# Patient Record
Sex: Female | Born: 1960 | Race: White | Hispanic: No | Marital: Single | Smoking: Former smoker
Health system: Southern US, Community
[De-identification: ages and names within clinical notes are randomized; demographics above are authoritative.]

## PROBLEM LIST (undated history)

## (undated) DIAGNOSIS — F419 Anxiety disorder, unspecified: Secondary | ICD-10-CM

## (undated) DIAGNOSIS — E079 Disorder of thyroid, unspecified: Secondary | ICD-10-CM

## (undated) DIAGNOSIS — I10 Essential (primary) hypertension: Secondary | ICD-10-CM

## (undated) DIAGNOSIS — J449 Chronic obstructive pulmonary disease, unspecified: Secondary | ICD-10-CM

## (undated) DIAGNOSIS — I509 Heart failure, unspecified: Secondary | ICD-10-CM

## (undated) DIAGNOSIS — E119 Type 2 diabetes mellitus without complications: Secondary | ICD-10-CM

## (undated) DIAGNOSIS — J45909 Unspecified asthma, uncomplicated: Secondary | ICD-10-CM

## (undated) HISTORY — PX: ABDOMINAL HYSTERECTOMY: SHX81

---

## 1997-06-21 ENCOUNTER — Ambulatory Visit: Admission: RE | Admit: 1997-06-21 | Discharge: 1997-06-21 | Payer: Self-pay | Admitting: *Deleted

## 2007-07-08 ENCOUNTER — Ambulatory Visit: Payer: Self-pay | Admitting: Internal Medicine

## 2007-07-08 DIAGNOSIS — F17200 Nicotine dependence, unspecified, uncomplicated: Secondary | ICD-10-CM

## 2007-07-08 DIAGNOSIS — J449 Chronic obstructive pulmonary disease, unspecified: Secondary | ICD-10-CM | POA: Insufficient documentation

## 2007-07-08 DIAGNOSIS — I10 Essential (primary) hypertension: Secondary | ICD-10-CM | POA: Insufficient documentation

## 2007-07-08 DIAGNOSIS — J438 Other emphysema: Secondary | ICD-10-CM

## 2007-07-08 DIAGNOSIS — E785 Hyperlipidemia, unspecified: Secondary | ICD-10-CM

## 2007-07-08 DIAGNOSIS — Z720 Tobacco use: Secondary | ICD-10-CM | POA: Insufficient documentation

## 2007-07-08 DIAGNOSIS — G4730 Sleep apnea, unspecified: Secondary | ICD-10-CM

## 2007-07-08 DIAGNOSIS — J45909 Unspecified asthma, uncomplicated: Secondary | ICD-10-CM

## 2007-07-08 DIAGNOSIS — R635 Abnormal weight gain: Secondary | ICD-10-CM | POA: Insufficient documentation

## 2007-07-09 ENCOUNTER — Encounter (INDEPENDENT_AMBULATORY_CARE_PROVIDER_SITE_OTHER): Payer: Self-pay | Admitting: *Deleted

## 2007-11-13 ENCOUNTER — Ambulatory Visit: Payer: Self-pay | Admitting: Cardiology

## 2007-11-20 ENCOUNTER — Inpatient Hospital Stay (HOSPITAL_COMMUNITY): Admission: AD | Admit: 2007-11-20 | Discharge: 2007-11-21 | Payer: Self-pay | Admitting: Cardiovascular Disease

## 2007-11-20 ENCOUNTER — Ambulatory Visit: Payer: Self-pay | Admitting: Cardiology

## 2010-05-16 NOTE — Discharge Summary (Signed)
Terri Casey, Terri Casey             ACCOUNT NO.:  [id number]   MEDICAL RECORD NO.:  [id number]          PATIENT TYPE:  INP   LOCATION:  2005                         FACILITY:  [facility]   PHYSICIAN:  Jonelle Sidle, MD DATE OF BIRTH:  [birth year]   DATE OF ADMISSION:  11/20/2007  DATE OF DISCHARGE:  11/21/2007                               DISCHARGE SUMMARY   PRIMARY CARDIOLOGIST:  Learta Codding, MD,FACC.   PRIMARY CARE Joseluis Alessio:  Currently [facility]  although the patient plans to transition to Dr. Olena Leatherwood in [place].   PULMONOLOGIST:  Dr. Orson Aloe in [place].   ENDOCRINOLOGIST:  Dr. Patrecia Pace.   DISCHARGE DIAGNOSIS:  Chest pain.   SECONDARY DIAGNOSES:  1. Hashimoto's thyroiditis with elevated TSH and low free T4 this      admission.  2. Small pericardial effusion.  3. Ongoing tobacco abuse.  4. Chronic obstructive pulmonary disease.  5. Community acquired pneumonia.  6. Hypertension.  7. Hyperlipidemia.  8. Obstructive sleep apnea.  9. Anxiety and depression.  10.Obesity.   ALLERGIES:  ESTROGEN.   PROCEDURES:  Left heart cardiac catheterization revealing normal  coronary arteries with nonobstructing disease in the LAD and normal LV  function.  Normal right heart pressures.   HISTORY OF PRESENT ILLNESS:  A 50 year old female with the above problem  list.  She was admitted to [facility] on November 17, 2007,  secondary to worsening symptoms of dyspnea and cough.  She was found to  be hypoxic on admission.  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  She  was placed on steroids, antibiotics and inhaler therapy and was  evaluated by Dr. Orson Aloe of pulmonology at [facility].  A CT of  her chest was performed and showed/reported a large pericardial effusion  with coronary calcification.  Dr. Andee Lineman  of [facility] was  consulted and repeat echocardiogram again showed a small pericardial  effusion with normal LV function.  The patient further described a  history of exertional chest discomfort and for this reason the decision  was made to transfer to [facility] for further evaluation and  catheterization.   HOSPITAL COURSE:  Left heart cardiac catheterization was performed on  November 19 revealing a 30% stenosis in the proximal LAD with otherwise  normal coronary arteries and normal LV function.  Right heart pressures  were normal.  Following the procedure Terri Casey has had no recurrent  chest discomfort, has been ambulating without difficulty and oxygenating  well.  She will be maintained on a steroid taper as well as antibiotics  to complete her course.  Of note, she was on [place] Thyroid prior to  admission.  However, the team at [facility] switched her over to  Synthroid 200 mcg daily.  We have recommended that she follow with Dr.  Patrecia Pace as well as establish or reestablish primary care followup.  The  patient will be discharged home today in good condition.  DISCHARGE LABS:  Hemoglobin 13.0, hematocrit 38.4, WBC 11.1, platelets  195, sodium 136, potassium 4.5, chloride 99, CO2 30, BUN 23, creatinine  0.83, glucose 124.   DISPOSITION:  Terri Casey will be discharged home today in good condition.   FOLLOWUP PLANS AND APPOINTMENTS:  She was asked to follow up with  primary care (she plans to follow up with Dr. Olena Leatherwood) in the next 1-2  weeks.  We asked her to follow up with Dr. Patrecia Pace in the next 3-4  weeks for followup of her hypothyroidism.   DISCHARGE MEDICATIONS:  1. Aspirin 81 mg daily.  2. Lisinopril/HCTZ 20/25 mg daily.  3. Crestor 20 mg daily.  4. Ceftin 250 mg b.i.d. x4 additional days.  5. Zithromax 500 mg daily x2 more days.  6. Advair 250/50 one puff b.i.d.  7. Singulair 10 mg q.h.s.  8. Synthroid 200 mcg daily.  9. Spiriva 18 mcg inhaler  daily.  10.Prednisone 10 mg six tabs November 21 and November 22; four tabs      November 23, November 24 and November 25; two tabs November 26,      November 27 and November 28; one tab November 29, November 30,      December 1.  11.ProAir HFA 90 mcg spray one to two sprays every 4 hours p.r.n.  12.Lexapro 20 mg daily.   OUTSTANDING LAB STUDIES:  The patient will require followup BMET in 1-2  weeks as we did adjust her lisinopril/HCTZ dose.  She will also require  followup thyroid function testing.  Duration of discharge encounter 65  minutes including physician time.      Nicolasa Ducking, ANP      Jonelle Sidle, MD  Electronically Signed    CB/MEDQ  D:  11/21/2007  T:  11/21/2007  Job:  [id number]   cc:   Alan Mulder, M.D.  Lia Hopping  Dr Orson Aloe

## 2010-05-16 NOTE — Cardiovascular Report (Signed)
NAMEMARIABELEN, Terri Casey             ACCOUNT NO.:  [id number]   MEDICAL RECORD NO.:  [id number]          PATIENT TYPE:  INP   LOCATION:  2005                         FACILITY:  [facility]   PHYSICIAN:  Everardo Beals. Juanda Chance, MD, FACCDATE OF BIRTH:  1960-09-16   DATE OF PROCEDURE:  11/20/2007  DATE OF DISCHARGE:                            CARDIAC CATHETERIZATION   CLINICAL HISTORY:  [name] is 50 years old and was recently admitted  to [facility] with shortness of breath and cough.  She also  recently had Hashimoto thyroiditis and recently had become hypothyroid.  She was found to have a pericardial effusion on CT scan and also  coronary calcification.  She was seen in consultation by Dr. Andee Lineman who  arranged for her transfer here for evaluation with cardiac  catheterization to rule out coronary artery disease as a cause of her  shortness of breath.   PROCEDURE:  Right heart catheterization was performed percutaneously via  the femoral vein using a venous sheath and Swan-Ganz thermodilution  catheter.  Left heart catheterization was performed percutaneously via  the right femoral artery, arterial sheath, and 6-French preformed  coronary catheters.  A front wall arterial puncture was performed and  Omnipaque contrast was used.  The patient tolerated the procedure well  and left the laboratory in satisfactory condition.   RESULTS:  The left main coronary is free of significant disease.   The left anterior descending artery gave rise to a diagonal branch,  septal perforator, and second diagonal branch.  There was 30% narrowing  in the proximal LAD and there were mild irregularities throughout the  LAD.   Circumflex artery gave rise to a large marginal branch and a small AV  branch.  These vessels were free of significant disease.   The right coronary was a moderate-sized vessel, gave rise to a conus  branch, 2 ventricle branches of the posterior descending branch, and a  posterolateral  branch.  These vessels were free of significant disease.   The left ventriculogram performed in the RAO projection showed vigorous  wall motion with no areas of hypokinesis.  The estimated ejection  fraction was 60%.   HEMODYNAMIC DATA:  The right artery pressure was 1, mean.  The pulmonary  artery pressure was 24/9 with a mean of 15.  Pulmonary wedge pressure  was 3, mean.  The left ventricular pressure was 120/9.  The aortic  pressure was 120/75 with a mean of 80.   CONCLUSION:  1. Mild nonobstructive coronary artery disease with 30% narrowing in      the proximal left anterior descending and mild irregularities in      the left anterior descending and no significant plaque seen in the      circumflex and right coronary artery.  2. Normal left ventricular function and normal left ventricular      filling pressures.   RECOMMENDATIONS:  1. Normal LV filling pressures and pulmonary artery pressures.  2. Normal left ventricular function and left ventricular filling      pressures.  3. Normal pulmonary artery pressures.   RECOMMENDATIONS:  Due to these findings,  I think the patient's recent  symptoms of dyspnea are not cardiac related.  They may be related to her  smoking and pulmonary disease.   ADDENDUM:  The cardiac output/cardiac index was 8.0/3.3 L/min/sq. m.      Bruce R. Juanda Chance, MD, [facility]  Electronically Signed     BRB/MEDQ  D:  11/20/2007  T:  11/21/2007  Job:  [id number]   cc:   Learta Codding, MD,FACC

## 2010-10-04 LAB — POCT I-STAT 3, VENOUS BLOOD GAS (G3P V): pH, Ven: 7.423 — ABNORMAL HIGH

## 2010-10-04 LAB — POCT I-STAT 3, ART BLOOD GAS (G3+)
pCO2 arterial: 40.9
pO2, Arterial: 59 — ABNORMAL LOW

## 2010-10-04 LAB — CBC
HCT: 38.4
Hemoglobin: 13
MCHC: 33.8
RBC: 3.95
RDW: 15.5

## 2010-10-04 LAB — BASIC METABOLIC PANEL
CO2: 30
GFR calc non Af Amer: >60
Glucose, Bld: 124 — ABNORMAL HIGH
Potassium: 4.5
Sodium: 136

## 2010-10-04 LAB — GLUCOSE, CAPILLARY

## 2011-11-01 ENCOUNTER — Other Ambulatory Visit (HOSPITAL_COMMUNITY): Payer: Self-pay | Admitting: "Endocrinology

## 2011-11-01 DIAGNOSIS — E042 Nontoxic multinodular goiter: Secondary | ICD-10-CM

## 2011-11-12 ENCOUNTER — Ambulatory Visit (HOSPITAL_COMMUNITY)
Admission: RE | Admit: 2011-11-12 | Discharge: 2011-11-12 | Disposition: A | Discharge status: Home / Self Care | Payer: Medicaid Other | Source: Ambulatory Visit | Attending: "Endocrinology | Admitting: "Endocrinology

## 2011-11-12 DIAGNOSIS — E042 Nontoxic multinodular goiter: Secondary | ICD-10-CM | POA: Insufficient documentation

## 2011-11-12 DIAGNOSIS — R599 Enlarged lymph nodes, unspecified: Secondary | ICD-10-CM | POA: Insufficient documentation

## 2012-09-26 ENCOUNTER — Emergency Department (HOSPITAL_COMMUNITY)
Admission: EM | Admit: 2012-09-26 | Discharge: 2012-09-27 | Disposition: A | Discharge status: Home / Self Care | Payer: Medicaid Other | Attending: Emergency Medicine | Admitting: Emergency Medicine

## 2012-09-26 ENCOUNTER — Emergency Department (HOSPITAL_COMMUNITY): Payer: Medicaid Other

## 2012-09-26 ENCOUNTER — Encounter (HOSPITAL_COMMUNITY): Payer: Self-pay

## 2012-09-26 DIAGNOSIS — E079 Disorder of thyroid, unspecified: Secondary | ICD-10-CM | POA: Insufficient documentation

## 2012-09-26 DIAGNOSIS — IMO0002 Reserved for concepts with insufficient information to code with codable children: Secondary | ICD-10-CM | POA: Insufficient documentation

## 2012-09-26 DIAGNOSIS — F17200 Nicotine dependence, unspecified, uncomplicated: Secondary | ICD-10-CM | POA: Insufficient documentation

## 2012-09-26 DIAGNOSIS — Z79899 Other long term (current) drug therapy: Secondary | ICD-10-CM | POA: Insufficient documentation

## 2012-09-26 DIAGNOSIS — J441 Chronic obstructive pulmonary disease with (acute) exacerbation: Secondary | ICD-10-CM | POA: Insufficient documentation

## 2012-09-26 DIAGNOSIS — F411 Generalized anxiety disorder: Secondary | ICD-10-CM | POA: Insufficient documentation

## 2012-09-26 HISTORY — DX: Unspecified asthma, uncomplicated: J45.909

## 2012-09-26 HISTORY — DX: Anxiety disorder, unspecified: F41.9

## 2012-09-26 HISTORY — DX: Disorder of thyroid, unspecified: E07.9

## 2012-09-26 HISTORY — DX: Chronic obstructive pulmonary disease, unspecified: J44.9

## 2012-09-26 LAB — CBC WITH DIFFERENTIAL/PLATELET
Basophils Absolute: 0 10*3/uL (ref 0.0–0.1)
Basophils Relative: 0 % (ref 0–1)
Eosinophils Absolute: 0.3 10*3/uL (ref 0.0–0.7)
Eosinophils Relative: 3 % (ref 0–5)
HCT: 47.1 % — ABNORMAL HIGH (ref 36.0–46.0)
Hemoglobin: 14.9 g/dL (ref 12.0–15.0)
MCH: 32.3 pg (ref 26.0–34.0)
MCHC: 31.6 g/dL (ref 30.0–36.0)
Monocytes Absolute: 0.7 10*3/uL (ref 0.1–1.0)
Neutro Abs: 6.2 10*3/uL (ref 1.7–7.7)
Platelets: 182 10*3/uL (ref 150–400)
RDW: 14.9 % (ref 11.5–15.5)

## 2012-09-26 LAB — BLOOD GAS, ARTERIAL
O2 Content: 2.5 L/min
Patient temperature: 37
pH, Arterial: 7.393 (ref 7.350–7.450)

## 2012-09-26 LAB — PRO B NATRIURETIC PEPTIDE: Pro B Natriuretic peptide (BNP): 81.7 pg/mL (ref 0–125)

## 2012-09-26 LAB — COMPREHENSIVE METABOLIC PANEL
AST: 12 U/L (ref 0–37)
Albumin: 3.5 g/dL (ref 3.5–5.2)
Calcium: 9.3 mg/dL (ref 8.4–10.5)
Chloride: 103 mEq/L (ref 96–112)
Creatinine, Ser: 0.99 mg/dL (ref 0.50–1.10)

## 2012-09-26 LAB — D-DIMER, QUANTITATIVE: D-Dimer, Quant: 0.73 ug/mL-FEU — ABNORMAL HIGH (ref 0.00–0.48)

## 2012-09-26 MED ORDER — IPRATROPIUM BROMIDE 0.02 % IN SOLN
0.5000 mg | Freq: Once | RESPIRATORY_TRACT | Status: AC
  Administered 2012-09-26: 0.5 mg via RESPIRATORY_TRACT
  Filled 2012-09-26: qty 2.5

## 2012-09-26 MED ORDER — IOHEXOL 350 MG/ML SOLN
100.0000 mL | Freq: Once | INTRAVENOUS | Status: AC | PRN
  Administered 2012-09-26: 100 mL via INTRAVENOUS

## 2012-09-26 MED ORDER — PREDNISONE 10 MG PO TABS
60.0000 mg | ORAL_TABLET | Freq: Once | ORAL | Status: AC
  Administered 2012-09-26: 60 mg via ORAL
  Filled 2012-09-26 (×2): qty 1

## 2012-09-26 MED ORDER — ALBUTEROL SULFATE (5 MG/ML) 0.5% IN NEBU
5.0000 mg | INHALATION_SOLUTION | Freq: Once | RESPIRATORY_TRACT | Status: AC
Start: 2012-09-26 — End: 2012-09-26
  Administered 2012-09-26: 5 mg via RESPIRATORY_TRACT
  Filled 2012-09-26: qty 1

## 2012-09-26 MED ORDER — ALBUTEROL SULFATE (5 MG/ML) 0.5% IN NEBU
INHALATION_SOLUTION | RESPIRATORY_TRACT | Status: AC
  Administered 2012-09-26: 5 mg
  Filled 2012-09-26: qty 1

## 2012-09-26 MED ORDER — IPRATROPIUM BROMIDE 0.02 % IN SOLN
RESPIRATORY_TRACT | Status: AC
  Administered 2012-09-26: 0.5 mg
  Filled 2012-09-26: qty 2.5

## 2012-09-26 NOTE — Progress Notes (Signed)
Pt getting 3 nebs , as ordered ask for by DR Rancor

## 2012-09-26 NOTE — ED Notes (Signed)
dx'd with bronchitis, on nebs, home O2, inhalers. No relief with same. Here by EMS

## 2012-09-26 NOTE — ED Provider Notes (Signed)
CSN: [id number]     Arrival date & time 09/26/12  2139 History  This chart was scribed for Terri Octave, MD by Bennett Scrape, ED Scribe. This patient was seen in room APA02/APA02 and the patient's care was started at 9:46 PM.   Chief Complaint  Patient presents with  . Shortness of Breath    The history is provided by the patient. No language interpreter was used.    HPI Comments: Terri Casey is a 52 y.o. female with a h/o COPD treated with 2.5 L [place] continuously at home who presents to the Emergency Department brought in by ambulance complaining of persistent SOB over the past 2 days that has been gradually worsening since the onset. The symptoms are worsened with laying flat.  She lists NP cough, chills and CP and RUQ with coughing as associated symptoms. She also reports a decreased appetite which is a new symptom today. She has otherwise been eating and drinking normally since the onset. Pt states that she was diagnosed with bronchitis 11 days ago and was given a nebulizer and prednisone. She denies being prescribed any antibiotics. She reports taking 4 breathing treatments today plus the one she recieved en route with improvement. Last hospitalization for COPD was last year. She denies having a h/o cardiac disease or DM. She denies emesis and known fevers at home as associated symptoms. She is a 0.5 ppd smoker.   PCP is Dr. Loney Hering   Past Medical History  Diagnosis Date  . Asthma   . COPD (chronic obstructive pulmonary disease)   . Anxiety   . Thyroid disease    Past Surgical History  Procedure Laterality Date  . Abdominal hysterectomy     No family history on file. History  Substance Use Topics  . Smoking status: Current Every Day Smoker -- 0.50 packs/day  . Smokeless tobacco: Not on file  . Alcohol Use: No   No OB history provided.  Review of Systems  A complete 10 system review of systems was obtained and all systems are negative except as noted in the HPI and  PMH.   Allergies  Estrogens; Mustard seed; and Strawberry  Home Medications   Current Outpatient Rx  Name  Route  Sig  Dispense  Refill  . albuterol (PROAIR HFA) 108 (90 BASE) MCG/ACT inhaler   Inhalation   Inhale 2 puffs into the lungs every 6 (six) hours as needed for wheezing.         Marland Kitchen ALPRAZolam (XANAX) 1 MG tablet   Oral   Take 1 mg by mouth 2 (two) times daily.         . benzonatate (TESSALON) 100 MG capsule   Oral   Take 100 mg by mouth 3 (three) times daily as needed for cough.         . fluticasone (FLONASE) 50 MCG/ACT nasal spray   Nasal   Place 2 sprays into the nose daily.         . Fluticasone-Salmeterol (ADVAIR) 250-50 MCG/DOSE AEPB   Inhalation   Inhale 1 puff into the lungs every 12 (twelve) hours.         Marland Kitchen levothyroxine (SYNTHROID, LEVOTHROID) 150 MCG tablet   Oral   Take 150 mcg by mouth daily before breakfast.         . loratadine (CLARITIN) 10 MG tablet   Oral   Take 10 mg by mouth daily.         Marland Kitchen oxyCODONE-acetaminophen (PERCOCET) 10-325 MG per  tablet   Oral   Take 1 tablet by mouth every 6 (six) hours as needed for pain.         Marland Kitchen tiotropium (SPIRIVA) 18 MCG inhalation capsule   Inhalation   Place 18 mcg into inhaler and inhale daily.         Marland Kitchen albuterol (PROVENTIL HFA;VENTOLIN HFA) 108 (90 BASE) MCG/ACT inhaler   Inhalation   Inhale 2 puffs into the lungs every 6 (six) hours as needed for wheezing.   1 Inhaler   2   . doxycycline (VIBRAMYCIN) 100 MG capsule   Oral   Take 1 capsule (100 mg total) by mouth 2 (two) times daily.   20 capsule   0   . predniSONE (DELTASONE) 50 MG tablet      1 tablet PO daily   5 tablet   0     Triage Vitals: BP 125/75  Pulse 70  Temp(Src) 97.9 F (36.6 C) (Oral)  Resp 24  Ht 5\' 9"  (1.753 m)  Wt 269 lb (122.018 kg)  BMI 39.71 kg/m2  SpO2 94%  Physical Exam  Nursing note and vitals reviewed. Constitutional: She is oriented to person, place, and time. She appears  well-developed and well-nourished. No distress.  HENT:  Head: Normocephalic and atraumatic.  Eyes: Conjunctivae and EOM are normal.  Neck: Normal range of motion. Neck supple. No tracheal deviation present.  Cardiovascular: Normal rate, regular rhythm and normal heart sounds.   No murmur heard. Pulmonary/Chest: Tachypnea noted. No respiratory distress. She has wheezes. She has no rales.  Speaking in full sentences, mild tachypnea, moderate air exchange with bronchitic expiratory wheezing   Abdominal: Soft. Bowel sounds are normal. There is no tenderness.  Musculoskeletal: Normal range of motion. She exhibits no edema (no calf swelling) and no tenderness (no calf tenderness).  Neurological: She is alert and oriented to person, place, and time. No cranial nerve deficit.  Skin: Skin is warm and dry.  Psychiatric: She has a normal mood and affect. Her behavior is normal.    ED Course  Procedures (including critical care time)  Medications  albuterol (PROVENTIL) (5 MG/ML) 0.5% nebulizer solution 5 mg (5 mg Nebulization Given 09/26/12 2154)  ipratropium (ATROVENT) nebulizer solution 0.5 mg (0.5 mg Nebulization Given 09/26/12 2153)  predniSONE (DELTASONE) tablet 60 mg (60 mg Oral Given 09/26/12 2156)  albuterol (PROVENTIL) (5 MG/ML) 0.5% nebulizer solution (5 mg  Given 09/26/12 2308)  ipratropium (ATROVENT) 0.02 % nebulizer solution (0.5 mg  Given 09/26/12 2307)  iohexol (OMNIPAQUE) 350 MG/ML injection 100 mL (100 mLs Intravenous Contrast Given 09/26/12 2352)  ipratropium (ATROVENT) 0.02 % nebulizer solution (0.5 mg  Given 09/27/12 0017)  albuterol (PROVENTIL) (5 MG/ML) 0.5% nebulizer solution (5 mg  Given 09/27/12 0016)    DIAGNOSTIC STUDIES: Oxygen Saturation is 98% on 2.5 L [name], normal by my interpretation.    COORDINATION OF CARE: 9:52 PM-Discussed treatment plan which includes breathing treatment, CXR, CBC panel, CMP and d-dimer with pt at bedside and pt agreed to plan.   Labs Review Labs  Reviewed  CBC WITH DIFFERENTIAL - Abnormal; Notable for the following:    WBC 10.9 (*)    HCT 47.1 (*)    MCV 101.9 (*)    All other components within normal limits  COMPREHENSIVE METABOLIC PANEL - Abnormal; Notable for the following:    Alkaline Phosphatase 127 (*)    GFR calc non Af Amer 64 (*)    GFR calc Af Amer 74 (*)  All other components within normal limits  D-DIMER, QUANTITATIVE - Abnormal; Notable for the following:    D-Dimer, Quant 0.73 (*)    All other components within normal limits  BLOOD GAS, ARTERIAL - Abnormal; Notable for the following:    pCO2 arterial 45.6 (*)    pO2, Arterial 66.6 (*)    Bicarbonate 27.2 (*)    Acid-Base Excess 2.7 (*)    All other components within normal limits  TROPONIN I  PRO B NATRIURETIC PEPTIDE   Imaging Review Dg Chest 2 View  09/26/2012   *RADIOLOGY REPORT*  Clinical Data: Shortness of breath.  CHEST - 2 VIEW  Comparison: Chest radiograph October 16, 2011.  Findings: Cardiomediastinal silhouette is unremarkable and unchanged.  Similar pulmonary hyperexpansion with flattening of the hemidiaphragms.  Mild prominence of interstitial markings most conspicuous in the lung bases similar.  No superimposed focal consolidations or pleural effusions.  Trachea projects midline and there is no pneumothorax.  Multiple EKG lines overlying the patient which may obscure underlying pathology.  Included soft tissue planes and osseous structures are not suspicious.  IMPRESSION: Emphysematous changes with interstitial prominence in the lung bases which appears chronic, no acute cardiopulmonary process.   Original Report Authenticated By: Awilda Metro   Ct Angio Chest Pe W/cm &/or Wo Cm  09/27/2012   CLINICAL DATA:  Shortness of breath. Weakness. History of COPD and bronchitis. Thyroid disease. Anxiety.  EXAM: CT ANGIOGRAPHY CHEST WITH CONTRAST  TECHNIQUE: Multidetector CT imaging of the chest was performed using the standard protocol during bolus  administration of intravenous contrast. Multiplanar CT image reconstructions including MIPs were obtained to evaluate the vascular anatomy.  CONTRAST:  OMNIPAQUE IOHEXOL 350 MG/ML SOLN  COMPARISON:  Plain films earlier today. CT 03/12/2009 from [facility] hospital.  FINDINGS: Lung windows demonstrate mild motion degradation. Mild centrilobular emphysema and bronchial wall thickening. Bibasilar subsegmental atelectasis.  No airspace disease.  Soft tissue windows: The quality of this exam for evaluation of pulmonary embolism is moderate to good. There is a large amount of contrast remaining in the SVC. No evidence of pulmonary embolism.  Normal appearance of the thoracic aorta, without dissection or aneurysm. There is atherosclerosis within. Normal heart size with a similar moderate pericardial effusion.  Pulmonary artery enlargement, with the outflow tract measuring 3.6 cm.  Borderline right infrahilar adenopathy on image 62/ series 5 is chronic and likely reactive.  Limited abdominal imaging demonstrates no significant findings.  Bone windows demonstrate no acute osseous finding.  Review of the MIP images confirms the above findings.  IMPRESSION: 1. No evidence of pulmonary embolism. Mild motion and bolus timing degradation. 2. Chronic moderate pericardial effusion. 3. Centrilobular emphysema. 4. Pulmonary artery enlargement suggests pulmonary arterial hypertension.   Electronically Signed   By: Jeronimo Greaves   On: 09/27/2012 00:19    MDM   1. COPD exacerbation    History of COPD and home oxygen presenting with one week history of worsening shortness of breath. Treated for bronchitis by her PCP last week with prednisone. Worsening dyspnea today, worse with lying down. No chest pain, fever. Cough is nonproductive.  Nebulizers, steroids, antibiotics. CXR without infiltrate.  Work of breathing improved after nebs x 2 in ED. BNP normal. No evidence of CHF or volume overload.  Patient ambulatory in the  ED, without home O2 on, maintaining O2 saturations >90%.  D-dimer elevated. CTPE pending at time of sign out to Dr. Read Drivers. Anticipate discharge home with steroids, antibiotics and bronchodilators.   Date: 09/26/2012  Rate: 89  Rhythm: normal sinus rhythm  QRS Axis: normal  Intervals: normal  ST/T Wave abnormalities: normal  Conduction Disutrbances:none  Narrative Interpretation:   Old EKG Reviewed: none available   I personally performed the services described in this documentation, which was scribed in my presence. The recorded information has been reviewed and is accurate.      Terri Octave, MD 09/27/12 0120

## 2012-09-27 MED ORDER — IPRATROPIUM BROMIDE 0.02 % IN SOLN
RESPIRATORY_TRACT | Status: AC
  Administered 2012-09-27: 0.5 mg
  Filled 2012-09-27: qty 2.5

## 2012-09-27 MED ORDER — PREDNISONE 50 MG PO TABS: ORAL_TABLET | ORAL | Status: DC

## 2012-09-27 MED ORDER — DOXYCYCLINE HYCLATE 100 MG PO CAPS: 100.0000 mg | ORAL_CAPSULE | Freq: Two times a day (BID) | ORAL | Status: DC

## 2012-09-27 MED ORDER — ALBUTEROL SULFATE HFA 108 (90 BASE) MCG/ACT IN AERS: 2.0000 | INHALATION_SPRAY | Freq: Four times a day (QID) | RESPIRATORY_TRACT | Status: DC | PRN

## 2012-09-27 MED ORDER — ALBUTEROL SULFATE (5 MG/ML) 0.5% IN NEBU
INHALATION_SOLUTION | RESPIRATORY_TRACT | Status: AC
  Administered 2012-09-27: 5 mg
  Filled 2012-09-27: qty 1

## 2012-09-27 NOTE — ED Notes (Signed)
Patient getting breathing treatment at this time.

## 2012-09-27 NOTE — ED Notes (Signed)
Ambulated patient on room air around nursing desk with pulse oximetry. Oxygen was between 88 and 91 %. Upon returning to the room patient was having a lot of wheezing and coughing. Patient states that her throat hurts from all the coughing.

## 2014-07-13 ENCOUNTER — Other Ambulatory Visit (HOSPITAL_COMMUNITY): Payer: Self-pay | Admitting: "Endocrinology

## 2014-07-13 DIAGNOSIS — E8849 Other mitochondrial metabolism disorders: Secondary | ICD-10-CM

## 2014-08-04 ENCOUNTER — Ambulatory Visit (HOSPITAL_COMMUNITY): Payer: Medicaid Other

## 2014-08-12 ENCOUNTER — Other Ambulatory Visit (HOSPITAL_COMMUNITY): Payer: Self-pay | Admitting: "Endocrinology

## 2014-08-12 DIAGNOSIS — E049 Nontoxic goiter, unspecified: Secondary | ICD-10-CM

## 2014-08-13 ENCOUNTER — Ambulatory Visit (HOSPITAL_COMMUNITY): Admission: RE | Admit: 2014-08-13 | Payer: Medicaid Other | Source: Ambulatory Visit

## 2014-10-25 ENCOUNTER — Other Ambulatory Visit: Payer: Self-pay | Admitting: "Endocrinology

## 2014-12-23 ENCOUNTER — Other Ambulatory Visit: Payer: Self-pay | Admitting: "Endocrinology

## 2015-01-24 ENCOUNTER — Other Ambulatory Visit: Payer: Self-pay | Admitting: "Endocrinology

## 2015-02-21 ENCOUNTER — Other Ambulatory Visit: Payer: Self-pay

## 2015-02-21 DIAGNOSIS — R739 Hyperglycemia, unspecified: Secondary | ICD-10-CM

## 2015-02-21 DIAGNOSIS — E039 Hypothyroidism, unspecified: Secondary | ICD-10-CM

## 2015-02-24 LAB — TSH: TSH: 18.27 mIU/L — ABNORMAL HIGH

## 2015-02-24 LAB — T4, FREE: Free T4: 0.9 ng/dL (ref 0.8–1.8)

## 2015-02-24 LAB — HEMOGLOBIN A1C
Hgb A1c MFr Bld: 5.3 % (ref <5.7)
MEAN PLASMA GLUCOSE: 105 mg/dL (ref <117)

## 2015-03-03 ENCOUNTER — Ambulatory Visit (INDEPENDENT_AMBULATORY_CARE_PROVIDER_SITE_OTHER): Payer: Medicaid Other | Admitting: "Endocrinology

## 2015-03-03 ENCOUNTER — Encounter: Payer: Self-pay | Admitting: "Endocrinology

## 2015-03-03 VITALS — BP 126/88 | HR 88 | Ht 69.0 in | Wt 264.0 lb

## 2015-03-03 DIAGNOSIS — E785 Hyperlipidemia, unspecified: Secondary | ICD-10-CM

## 2015-03-03 DIAGNOSIS — E559 Vitamin D deficiency, unspecified: Secondary | ICD-10-CM | POA: Diagnosis not present

## 2015-03-03 DIAGNOSIS — I10 Essential (primary) hypertension: Secondary | ICD-10-CM

## 2015-03-03 DIAGNOSIS — E039 Hypothyroidism, unspecified: Secondary | ICD-10-CM | POA: Diagnosis not present

## 2015-03-03 MED ORDER — LEVOTHYROXINE SODIUM 175 MCG PO TABS: 175.0000 ug | ORAL_TABLET | Freq: Every day | ORAL | Status: DC

## 2015-03-03 MED ORDER — VITAMIN D (ERGOCALCIFEROL) 1.25 MG (50000 UNIT) PO CAPS: 50000.0000 [IU] | ORAL_CAPSULE | ORAL | Status: DC

## 2015-03-03 NOTE — Progress Notes (Signed)
Subjective:    Patient ID: Terri Casey, female    DOB: Sep 07, 1960, PCP No primary care provider on file.   Past Medical History  Diagnosis Date  . Asthma   . COPD (chronic obstructive pulmonary disease) ([place])   . Anxiety   . Thyroid disease    Past Surgical History  Procedure Laterality Date  . Abdominal hysterectomy     Social History   Social History  . Marital Status: Single    Spouse Name: N/A  . Number of Children: N/A  . Years of Education: N/A   Social History Main Topics  . Smoking status: Current Every Day Smoker -- 0.50 packs/day  . Smokeless tobacco: None  . Alcohol Use: No  . Drug Use: No  . Sexual Activity: Not Asked   Other Topics Concern  . None   Social History Narrative   Outpatient Encounter Prescriptions as of 03/03/2015  Medication Sig  . albuterol (PROAIR HFA) 108 (90 BASE) MCG/ACT inhaler Inhale 2 puffs into the lungs every 6 (six) hours as needed for wheezing.  Marland Kitchen albuterol-ipratropium (COMBIVENT) 18-103 MCG/ACT inhaler Inhale into the lungs every 4 (four) hours.  . ALPRAZolam (XANAX) 1 MG tablet Take 1 mg by mouth 2 (two) times daily.  Marland Kitchen atorvastatin (LIPITOR) 10 MG tablet Take 10 mg by mouth daily.  . benzonatate (TESSALON) 100 MG capsule Take by mouth 3 (three) times daily as needed for cough.  . citalopram (CELEXA) 20 MG tablet Take 20 mg by mouth daily.  . cyclobenzaprine (FLEXERIL) 10 MG tablet Take 10 mg by mouth 3 (three) times daily as needed for muscle spasms.  . Fluticasone-Salmeterol (ADVAIR) 250-50 MCG/DOSE AEPB Inhale 1 puff into the lungs 2 (two) times daily.  Marland Kitchen levothyroxine (SYNTHROID, LEVOTHROID) 175 MCG tablet Take 1 tablet (175 mcg total) by mouth daily before breakfast.  . loratadine (CLARITIN) 10 MG tablet Take 10 mg by mouth daily.  Marland Kitchen oxyCODONE-acetaminophen (PERCOCET) 10-325 MG per tablet Take 1 tablet by mouth every 6 (six) hours as needed for pain.  Marland Kitchen tiotropium (SPIRIVA) 18 MCG inhalation capsule Place 18 mcg  into inhaler and inhale daily.  Marland Kitchen topiramate (TOPAMAX) 25 MG tablet Take 25 mg by mouth 2 (two) times daily.  . traZODone (DESYREL) 100 MG tablet Take 100 mg by mouth 3 (three) times daily.  . Vitamin D, Ergocalciferol, (DRISDOL) 50000 units CAPS capsule Take 1 capsule (50,000 Units total) by mouth once a week.  . [DISCONTINUED] levothyroxine (SYNTHROID, LEVOTHROID) 175 MCG tablet Take 175 mcg by mouth daily before breakfast.  . [DISCONTINUED] Vitamin D, Ergocalciferol, (DRISDOL) 50000 UNITS CAPS capsule TAKE 1 CAPSULE BY MOUTH EVERY WEEK  . [DISCONTINUED] albuterol (PROVENTIL HFA;VENTOLIN HFA) 108 (90 BASE) MCG/ACT inhaler Inhale 2 puffs into the lungs every 6 (six) hours as needed for wheezing.  . [DISCONTINUED] benzonatate (TESSALON) 100 MG capsule Take 100 mg by mouth 3 (three) times daily as needed for cough.  . [DISCONTINUED] doxycycline (VIBRAMYCIN) 100 MG capsule Take 1 capsule (100 mg total) by mouth 2 (two) times daily.  . [DISCONTINUED] fluticasone (FLONASE) 50 MCG/ACT nasal spray Place 2 sprays into the nose daily.  . [DISCONTINUED] Fluticasone-Salmeterol (ADVAIR) 250-50 MCG/DOSE AEPB Inhale 1 puff into the lungs every 12 (twelve) hours.  . [DISCONTINUED] levothyroxine (SYNTHROID, LEVOTHROID) 150 MCG tablet Take 150 mcg by mouth daily before breakfast.  . [DISCONTINUED] predniSONE (DELTASONE) 50 MG tablet 1 tablet PO daily   No facility-administered encounter medications on file as of 03/03/2015.  ALLERGIES: Allergies  Allergen Reactions  . Estrogens Hives  . Mustard [employer]  . Strawberry Extract Hives   VACCINATION STATUS:  There is no immunization history on file for this patient.  HPI 55 yr old female with medical hx of hypothyroidism, HTN, HPL, Osteoporosis, COPD, sleep apnea.  she was previously seen for same hypothyroidism requested by Dr. Wenda Overland. She did not keep her appointments since April 2016.  -she is known to have a habit of disappearing from care. Prior to her  last visit she did separate from care for 3 years.  Since last visit, she did have significant interruptions in her levothyroxine. She was supposed to be on 175 g of levothyroxine by mouth every morning.   -her weight is increasing to a current level of 264 pounds, she feels fatigued.   she continues to smoke cigs. she has family hx of hyperthyroidism in her sister, and unidentified thyroid dysfunction in her father.    Review of Systems Constitutional: + weight gain, + fatigue, no subjective hyperthermia/hypothermia Eyes: no blurry vision, no xerophthalmia ENT: no sore throat, no nodules palpated in throat, no dysphagia/odynophagia, no hoarseness Cardiovascular: no CP/SOB/palpitations/leg swelling Respiratory: no cough/SOB Gastrointestinal: no N/V/D/C Musculoskeletal: no muscle/joint aches Skin: no rashes Neurological: no tremors/numbness/tingling/dizziness Psychiatric: no depression/anxiety  Objective:    BP 126/88 mmHg  Pulse 88  Ht 5\' 9"  (1.753 m)  Wt 264 lb (119.75 kg)  BMI 38.97 kg/m2  SpO2 95%  Wt Readings from Last 3 Encounters:  03/03/15 264 lb (119.75 kg)  09/26/12 269 lb (122.018 kg)  07/08/07 275 lb 4 oz (124.853 kg)    Physical Exam Constitutional: obese, in NAD Eyes: PERRLA, EOMI, no exophthalmos ENT: moist mucous membranes, no thyromegaly, no cervical lymphadenopathy Cardiovascular: RRR, No MRG Respiratory: CTA B Gastrointestinal: abdomen soft, NT, ND, BS+ Musculoskeletal: no deformities, strength intact in all 4 Skin: moist, warm, no rashes Neurological: no tremor with outstretched hands, DTR normal in all 4  CMP ( most recent) CMP     Component Value Date/Time   NA 142 09/26/2012 2206   K 4.1 09/26/2012 2206   CL 103 09/26/2012 2206   CO2 30 09/26/2012 2206   GLUCOSE 91 09/26/2012 2206   BUN 16 09/26/2012 2206   CREATININE 0.99 09/26/2012 2206   CALCIUM 9.3 09/26/2012 2206   PROT 6.4 09/26/2012 2206   ALBUMIN 3.5 09/26/2012 2206   AST 12  09/26/2012 2206   ALT 12 09/26/2012 2206   ALKPHOS 127* 09/26/2012 2206   BILITOT 0.4 09/26/2012 2206   GFRNONAA 64* 09/26/2012 2206   GFRAA 74* 09/26/2012 2206    Diabetic Labs (most recent): Lab Results  Component Value Date   HGBA1C 5.3 02/21/2015    Assessment & Plan:   1. Hypothyroidism, unspecified hypothyroidism type - Her thyroid function tests are indicated if off treatment interruption. I advised her to be consistent in taking her thyroid hormone. I will resume levothyroxine 175 g by mouth every morning.  - We discussed about correct intake of levothyroxine, at fasting, with water, separated by at least 30 minutes from breakfast, and separated by more than 4 hours from calcium, iron, multivitamins, acid reflux medications (PPIs). -Patient is made aware of the fact that thyroid hormone replacement is needed for life, dose to be adjusted by periodic monitoring of thyroid function tests.  2. Essential hypertension, benign -Controlled. Continue current medications.  3. Hyperlipidemia -Continue Lipitor 10 mg by mouth daily at bedtime.  4. Vitamin D deficiency -Continue vitamin  D 50,000 units weekly for the next 12 weeks.   - I advised patient to maintain close follow up with No primary care provider on file. for primary care needs. Follow up plan: Return in about 3 months (around 06/03/2015) for high blood pressure, Vitamin D deficiency, underactive thyroid, Thyroid Ultrasound, follow up with pre-visit labs.  Glade Lloyd, MD Phone: [phone]  Fax: [phone]   03/03/2015, 5:06 PM

## 2015-03-21 ENCOUNTER — Other Ambulatory Visit: Payer: Self-pay | Admitting: "Endocrinology

## 2015-03-25 ENCOUNTER — Inpatient Hospital Stay (HOSPITAL_COMMUNITY)
Admission: EM | Admit: 2015-03-25 | Discharge: 2015-03-29 | DRG: 190 | Disposition: A | Discharge status: Home / Self Care | Payer: Medicaid Other | Attending: Internal Medicine | Admitting: Internal Medicine

## 2015-03-25 ENCOUNTER — Emergency Department (HOSPITAL_COMMUNITY): Payer: Medicaid Other

## 2015-03-25 ENCOUNTER — Encounter (HOSPITAL_COMMUNITY): Payer: Self-pay

## 2015-03-25 DIAGNOSIS — I10 Essential (primary) hypertension: Secondary | ICD-10-CM | POA: Diagnosis present

## 2015-03-25 DIAGNOSIS — J45909 Unspecified asthma, uncomplicated: Secondary | ICD-10-CM | POA: Diagnosis present

## 2015-03-25 DIAGNOSIS — E039 Hypothyroidism, unspecified: Secondary | ICD-10-CM | POA: Diagnosis present

## 2015-03-25 DIAGNOSIS — G4730 Sleep apnea, unspecified: Secondary | ICD-10-CM | POA: Diagnosis present

## 2015-03-25 DIAGNOSIS — J9601 Acute respiratory failure with hypoxia: Secondary | ICD-10-CM | POA: Insufficient documentation

## 2015-03-25 DIAGNOSIS — I313 Pericardial effusion (noninflammatory): Secondary | ICD-10-CM | POA: Diagnosis present

## 2015-03-25 DIAGNOSIS — I319 Disease of pericardium, unspecified: Secondary | ICD-10-CM | POA: Diagnosis not present

## 2015-03-25 DIAGNOSIS — Z9981 Dependence on supplemental oxygen: Secondary | ICD-10-CM | POA: Diagnosis not present

## 2015-03-25 DIAGNOSIS — F17200 Nicotine dependence, unspecified, uncomplicated: Secondary | ICD-10-CM | POA: Diagnosis present

## 2015-03-25 DIAGNOSIS — G4734 Idiopathic sleep related nonobstructive alveolar hypoventilation: Secondary | ICD-10-CM | POA: Insufficient documentation

## 2015-03-25 DIAGNOSIS — J440 Chronic obstructive pulmonary disease with acute lower respiratory infection: Secondary | ICD-10-CM | POA: Diagnosis present

## 2015-03-25 DIAGNOSIS — F419 Anxiety disorder, unspecified: Secondary | ICD-10-CM | POA: Diagnosis present

## 2015-03-25 DIAGNOSIS — J189 Pneumonia, unspecified organism: Secondary | ICD-10-CM | POA: Diagnosis present

## 2015-03-25 DIAGNOSIS — J441 Chronic obstructive pulmonary disease with (acute) exacerbation: Secondary | ICD-10-CM | POA: Diagnosis present

## 2015-03-25 DIAGNOSIS — R0602 Shortness of breath: Secondary | ICD-10-CM

## 2015-03-25 DIAGNOSIS — J9620 Acute and chronic respiratory failure, unspecified whether with hypoxia or hypercapnia: Secondary | ICD-10-CM | POA: Diagnosis not present

## 2015-03-25 DIAGNOSIS — I3139 Other pericardial effusion (noninflammatory): Secondary | ICD-10-CM | POA: Diagnosis present

## 2015-03-25 DIAGNOSIS — E785 Hyperlipidemia, unspecified: Secondary | ICD-10-CM | POA: Diagnosis present

## 2015-03-25 DIAGNOSIS — J449 Chronic obstructive pulmonary disease, unspecified: Secondary | ICD-10-CM | POA: Insufficient documentation

## 2015-03-25 DIAGNOSIS — R0600 Dyspnea, unspecified: Secondary | ICD-10-CM | POA: Diagnosis not present

## 2015-03-25 DIAGNOSIS — J438 Other emphysema: Secondary | ICD-10-CM | POA: Diagnosis present

## 2015-03-25 DIAGNOSIS — J9621 Acute and chronic respiratory failure with hypoxia: Secondary | ICD-10-CM | POA: Diagnosis not present

## 2015-03-25 LAB — BLOOD GAS, ARTERIAL
Acid-Base Excess: 3.8 mmol/L — ABNORMAL HIGH (ref 0.0–2.0)
BICARBONATE: 27.4 meq/L — AB (ref 20.0–24.0)
DRAWN BY: 105551
FIO2: 0.45
O2 Saturation: 91.4 %
PH ART: 7.424 (ref 7.350–7.450)
pCO2 arterial: 43.6 mmHg (ref 35.0–45.0)
pO2, Arterial: 60.1 mmHg — ABNORMAL LOW (ref 80.0–100.0)

## 2015-03-25 LAB — CBC WITH DIFFERENTIAL/PLATELET
BASOS ABS: 0 10*3/uL (ref 0.0–0.1)
Basophils Relative: 0 %
EOS ABS: 0 10*3/uL (ref 0.0–0.7)
EOS PCT: 0 %
HCT: 44.1 % (ref 36.0–46.0)
HEMOGLOBIN: 14.3 g/dL (ref 12.0–15.0)
LYMPHS ABS: 2.3 10*3/uL (ref 0.7–4.0)
Lymphocytes Relative: 24 %
MCH: 32.4 pg (ref 26.0–34.0)
MCHC: 32.4 g/dL (ref 30.0–36.0)
MCV: 99.8 fL (ref 78.0–100.0)
Monocytes Absolute: 0.8 10*3/uL (ref 0.1–1.0)
Monocytes Relative: 8 %
NEUTROS PCT: 68 %
Neutro Abs: 6.4 10*3/uL (ref 1.7–7.7)
PLATELETS: 199 10*3/uL (ref 150–400)
RBC: 4.42 MIL/uL (ref 3.87–5.11)
RDW: 13.6 % (ref 11.5–15.5)
WBC: 9.4 10*3/uL (ref 4.0–10.5)

## 2015-03-25 LAB — COMPREHENSIVE METABOLIC PANEL
ALBUMIN: 3.8 g/dL (ref 3.5–5.0)
ALK PHOS: 88 U/L (ref 38–126)
ALT: 9 U/L — AB (ref 14–54)
AST: 13 U/L — AB (ref 15–41)
Anion gap: 8 (ref 5–15)
BUN: 8 mg/dL (ref 6–20)
CHLORIDE: 100 mmol/L — AB (ref 101–111)
CO2: 28 mmol/L (ref 22–32)
CREATININE: 0.85 mg/dL (ref 0.44–1.00)
Calcium: 8.7 mg/dL — ABNORMAL LOW (ref 8.9–10.3)
GFR calc Af Amer: >60 mL/min (ref >60)
GFR calc non Af Amer: >60 mL/min (ref >60)
Glucose, Bld: 115 mg/dL — ABNORMAL HIGH (ref 65–99)
Potassium: 3.3 mmol/L — ABNORMAL LOW (ref 3.5–5.1)
SODIUM: 136 mmol/L (ref 135–145)
Total Bilirubin: 0.8 mg/dL (ref 0.3–1.2)
Total Protein: 7.8 g/dL (ref 6.5–8.1)

## 2015-03-25 LAB — D-DIMER, QUANTITATIVE (NOT AT ARMC): D DIMER QUANT: 0.79 ug{FEU}/mL — AB (ref 0.00–0.50)

## 2015-03-25 LAB — I-STAT TROPONIN, ED: TROPONIN I, POC: 0 ng/mL (ref 0.00–0.08)

## 2015-03-25 LAB — INFLUENZA PANEL BY PCR (TYPE A & B)
H1N1 flu by pcr: NOT DETECTED
INFLAPCR: NEGATIVE
INFLBPCR: NEGATIVE

## 2015-03-25 LAB — TROPONIN I

## 2015-03-25 LAB — I-STAT CG4 LACTIC ACID, ED: Lactic Acid, Venous: 0.84 mmol/L (ref 0.5–2.0)

## 2015-03-25 LAB — BRAIN NATRIURETIC PEPTIDE: B Natriuretic Peptide: 33 pg/mL (ref 0.0–100.0)

## 2015-03-25 MED ORDER — IPRATROPIUM BROMIDE 0.02 % IN SOLN: 0.5000 mg | Freq: Four times a day (QID) | RESPIRATORY_TRACT | Status: DC

## 2015-03-25 MED ORDER — ALBUTEROL SULFATE (2.5 MG/3ML) 0.083% IN NEBU
2.5000 mg | INHALATION_SOLUTION | Freq: Once | RESPIRATORY_TRACT | Status: AC
  Administered 2015-03-25: 2.5 mg via RESPIRATORY_TRACT
  Filled 2015-03-25: qty 3

## 2015-03-25 MED ORDER — IPRATROPIUM-ALBUTEROL 0.5-2.5 (3) MG/3ML IN SOLN
3.0000 mL | Freq: Once | RESPIRATORY_TRACT | Status: AC
  Administered 2015-03-25: 3 mL via RESPIRATORY_TRACT
  Filled 2015-03-25: qty 3

## 2015-03-25 MED ORDER — SODIUM CHLORIDE 0.9% FLUSH: 3.0000 mL | INTRAVENOUS | Status: DC | PRN

## 2015-03-25 MED ORDER — IPRATROPIUM-ALBUTEROL 0.5-2.5 (3) MG/3ML IN SOLN
3.0000 mL | Freq: Four times a day (QID) | RESPIRATORY_TRACT | Status: DC
  Administered 2015-03-25 – 2015-03-28 (×11): 3 mL via RESPIRATORY_TRACT
  Filled 2015-03-25 (×11): qty 3

## 2015-03-25 MED ORDER — SODIUM CHLORIDE 0.9 % IV SOLN: 250.0000 mL | INTRAVENOUS | Status: DC | PRN

## 2015-03-25 MED ORDER — SODIUM CHLORIDE 0.9% FLUSH
3.0000 mL | Freq: Two times a day (BID) | INTRAVENOUS | Status: DC
  Administered 2015-03-26 – 2015-03-29 (×3): 3 mL via INTRAVENOUS

## 2015-03-25 MED ORDER — METHYLPREDNISOLONE SODIUM SUCC 125 MG IJ SOLR
80.0000 mg | Freq: Two times a day (BID) | INTRAMUSCULAR | Status: DC
  Administered 2015-03-26: 80 mg via INTRAVENOUS
  Filled 2015-03-25: qty 2

## 2015-03-25 MED ORDER — SODIUM CHLORIDE 0.9% FLUSH
3.0000 mL | Freq: Two times a day (BID) | INTRAVENOUS | Status: DC
  Administered 2015-03-26 – 2015-03-28 (×4): 3 mL via INTRAVENOUS

## 2015-03-25 MED ORDER — GUAIFENESIN ER 600 MG PO TB12
600.0000 mg | ORAL_TABLET | Freq: Two times a day (BID) | ORAL | Status: DC
  Administered 2015-03-26: 600 mg via ORAL
  Filled 2015-03-25: qty 1

## 2015-03-25 MED ORDER — AZITHROMYCIN 250 MG PO TABS
500.0000 mg | ORAL_TABLET | Freq: Every day | ORAL | Status: AC
  Administered 2015-03-26: 500 mg via ORAL
  Filled 2015-03-25: qty 2

## 2015-03-25 MED ORDER — ALBUTEROL SULFATE (2.5 MG/3ML) 0.083% IN NEBU: 2.5000 mg | INHALATION_SOLUTION | RESPIRATORY_TRACT | Status: DC | PRN

## 2015-03-25 MED ORDER — AZITHROMYCIN 250 MG PO TABS: 250.0000 mg | ORAL_TABLET | Freq: Every day | ORAL | Status: DC

## 2015-03-25 MED ORDER — ALBUTEROL SULFATE (2.5 MG/3ML) 0.083% IN NEBU: 2.5000 mg | INHALATION_SOLUTION | Freq: Four times a day (QID) | RESPIRATORY_TRACT | Status: DC

## 2015-03-25 NOTE — ED Provider Notes (Signed)
CSN: [id number]     Arrival date & time 03/25/15  1859 History   First MD Initiated Contact with Patient 03/25/15 1901     Chief Complaint  Patient presents with  . Shortness of Breath     (Consider location/radiation/quality/duration/timing/severity/associated sxs/prior Treatment) Patient is a 55 y.o. female presenting with shortness of breath. The history is provided by the patient (Patient complains of shortness of breath for 2 days. Also patient complains of cough).  Shortness of Breath Severity:  Moderate Onset quality:  Gradual Timing:  Constant Progression:  Waxing and waning Chronicity:  Recurrent Context: activity   Relieved by:  Nothing Associated symptoms: wheezing   Associated symptoms: no abdominal pain, no chest pain, no cough, no headaches and no rash     Past Medical History  Diagnosis Date  . Asthma   . COPD (chronic obstructive pulmonary disease) ([place])   . Anxiety   . Thyroid disease    Past Surgical History  Procedure Laterality Date  . Abdominal hysterectomy     No family history on file. Social History  Substance Use Topics  . Smoking status: Current Every Day Smoker -- 0.50 packs/day  . Smokeless tobacco: None  . Alcohol Use: No   OB History    No data available     Review of Systems  Constitutional: Negative for appetite change and fatigue.  HENT: Negative for congestion, ear discharge and sinus pressure.   Eyes: Negative for discharge.  Respiratory: Positive for shortness of breath and wheezing. Negative for cough.   Cardiovascular: Negative for chest pain.  Gastrointestinal: Negative for abdominal pain and diarrhea.  Genitourinary: Negative for frequency and hematuria.  Musculoskeletal: Negative for back pain.  Skin: Negative for rash.  Neurological: Negative for seizures and headaches.  Psychiatric/Behavioral: Negative for hallucinations.      Allergies  Estrogens; Mustard seed; and Strawberry extract  Home Medications    Prior to Admission medications   Medication Sig Start Date End Date Taking? Authorizing Provider  albuterol (PROAIR HFA) 108 (90 BASE) MCG/ACT inhaler Inhale 2 puffs into the lungs every 6 (six) hours as needed for wheezing.    Historical Provider, MD  albuterol-ipratropium (COMBIVENT) 18-103 MCG/ACT inhaler Inhale into the lungs every 4 (four) hours.    Historical Provider, MD  ALPRAZolam Duanne Moron) 1 MG tablet Take 1 mg by mouth 2 (two) times daily.    Historical Provider, MD  atorvastatin (LIPITOR) 10 MG tablet Take 10 mg by mouth daily.    Historical Provider, MD  benzonatate (TESSALON) 100 MG capsule Take by mouth 3 (three) times daily as needed for cough.    Historical Provider, MD  citalopram (CELEXA) 20 MG tablet Take 20 mg by mouth daily.    Historical Provider, MD  cyclobenzaprine (FLEXERIL) 10 MG tablet Take 10 mg by mouth 3 (three) times daily as needed for muscle spasms.    Historical Provider, MD  Fluticasone-Salmeterol (ADVAIR) 250-50 MCG/DOSE AEPB Inhale 1 puff into the lungs 2 (two) times daily.    Historical Provider, MD  levothyroxine (SYNTHROID, LEVOTHROID) 175 MCG tablet TAKE 1 TABLET BY MOUTH EVERY DAY BEFORE BREAKFAST 03/21/15   Cassandria Anger, MD  loratadine (CLARITIN) 10 MG tablet Take 10 mg by mouth daily.    Historical Provider, MD  oxyCODONE-acetaminophen (PERCOCET) 10-325 MG per tablet Take 1 tablet by mouth every 6 (six) hours as needed for pain.    Historical Provider, MD  tiotropium (SPIRIVA) 18 MCG inhalation capsule Place 18 mcg into inhaler and inhale  daily.    Historical Provider, MD  topiramate (TOPAMAX) 25 MG tablet Take 25 mg by mouth 2 (two) times daily.    Historical Provider, MD  traZODone (DESYREL) 100 MG tablet Take 100 mg by mouth 3 (three) times daily.    Historical Provider, MD  Vitamin D, Ergocalciferol, (DRISDOL) 50000 units CAPS capsule Take 1 capsule (50,000 Units total) by mouth once a week. 03/03/15   Cassandria Anger, MD   BP 111/65 mmHg   Pulse 94  Temp(Src) 98.3 F (36.8 C) (Oral)  Resp 22  Ht 5\' 9"  (1.753 m)  Wt 265 lb (120.203 kg)  BMI 39.12 kg/m2  SpO2 86% Physical Exam  Constitutional: She is oriented to person, place, and time. She appears well-developed.  HENT:  Head: Normocephalic.  Eyes: Conjunctivae and EOM are normal. No scleral icterus.  Neck: Neck supple. No thyromegaly present.  Cardiovascular: Normal rate and regular rhythm.  Exam reveals no gallop and no friction rub.   No murmur heard. Pulmonary/Chest: No stridor. She has wheezes. She has no rales. She exhibits no tenderness.  Abdominal: She exhibits no distension. There is no tenderness. There is no rebound.  Musculoskeletal: Normal range of motion. She exhibits no edema.  Lymphadenopathy:    She has no cervical adenopathy.  Neurological: She is oriented to person, place, and time. She exhibits normal muscle tone. Coordination normal.  Skin: No rash noted. No erythema.  Psychiatric: She has a normal mood and affect. Her behavior is normal.    ED Course  Procedures (including critical care time) Labs Review Labs Reviewed  COMPREHENSIVE METABOLIC PANEL - Abnormal; Notable for the following:    Potassium 3.3 (*)    Chloride 100 (*)    Glucose, Bld 115 (*)    Calcium 8.7 (*)    AST 13 (*)    ALT 9 (*)    All other components within normal limits  CBC WITH DIFFERENTIAL/PLATELET  BRAIN NATRIURETIC PEPTIDE  INFLUENZA PANEL BY PCR (TYPE A & B, H1N1)  BLOOD GAS, ARTERIAL  I-STAT TROPOININ, ED  I-STAT CG4 LACTIC ACID, ED    Imaging Review Dg Chest 2 View  03/25/2015  CLINICAL DATA:  Shortness of breath for 1 week. Chest discomfort and palpitations. EXAM: CHEST  2 VIEW COMPARISON:  09/26/2012 FINDINGS: Cardiac enlargement with mild pulmonary vascular congestion. Increased opacity over the lung bases likely representing combination of chronic fibrosis and soft tissue attenuation. No definite consolidation or edema. Emphysematous changes in the  lungs. No blunting of costophrenic angles. No pneumothorax. IMPRESSION: Emphysematous changes and interstitial changes in the lungs. Cardiac enlargement with mild pulmonary vascular congestion. Electronically Signed   By: Lucienne Capers M.D.   On: 03/25/2015 20:29   I have personally reviewed and evaluated these images and lab results as part of my medical decision-making.   EKG Interpretation None     CRITICAL CARE Performed by: Marylee Belzer L Total critical care time: 40 minutes Critical care time was exclusive of separately billable procedures and treating other patients. Critical care was necessary to treat or prevent imminent or life-threatening deterioration. Critical care was time spent personally by me on the following activities: development of treatment plan with patient and/or surrogate as well as nursing, discussions with consultants, evaluation of patient's response to treatment, examination of patient, obtaining history from patient or surrogate, ordering and performing treatments and interventions, ordering and review of laboratory studies, ordering and review of radiographic studies, pulse oximetry and re-evaluation of patient's condition.  MDM  Final diagnoses:  COPD exacerbation ([place])    Patient with COPD exacerbation we'll admit    Milton Ferguson, MD 03/25/15 2100

## 2015-03-25 NOTE — Progress Notes (Addendum)
Of note ABG was not done in er due to patient having to wait for oxygen to stabilize after neb treatment, patient was then moved to floor from er before gas could be done. Once again it was appropriate to wait till oxygen stabilized before abg was drawn. It was drawn in a timely fashion with the wait and movement times factored in.

## 2015-03-25 NOTE — ED Notes (Signed)
Pt in by [facility] for sob, states her breathing has worsened over the past week.  Pt reports chest discomfort more with palpation.

## 2015-03-25 NOTE — H&P (Signed)
PCP:   No primary care provider on file.   Chief Complaint:  Sob, cough, wheezing  HPI: 55 yo female h/o COPD on 2 liters [place] qhs comes in with several days of worsening sob, cough and wheezing.  Pt says she just got the flu shot last week and thinks this is what made her sick.  She denies any sick contacts.  Reports fever and chills at home.  Denies any le edema or swelling or pain.  No weight gain.  Just reports feeling awful for over a day.  Pt was hypoxic on arrival and was on 4 liters [place].  cxr reveals no infiltrate.  Pt referred for admission for copde with hypoxia.  Flu is pending.  Pt reports she feels some better with treatment in the ED.  Of note, ABG stat was ordered over an hour ago in ED, not done yet.  Pt sent to floor now on 8 liter VM with sats of 92%.  Pt mentating normally at this time.  Review of Systems:  Positive and negative as per HPI otherwise all other systems are negative  Past Medical History: Past Medical History  Diagnosis Date  . Asthma   . COPD (chronic obstructive pulmonary disease) ([place])   . Anxiety   . Thyroid disease    Past Surgical History  Procedure Laterality Date  . Abdominal hysterectomy      Medications: Prior to Admission medications   Medication Sig Start Date End Date Taking? Authorizing Provider  albuterol (PROAIR HFA) 108 (90 BASE) MCG/ACT inhaler Inhale 2 puffs into the lungs every 6 (six) hours as needed for wheezing.   Yes Historical Provider, MD  albuterol-ipratropium (COMBIVENT) 18-103 MCG/ACT inhaler Inhale into the lungs every 4 (four) hours.   Yes Historical Provider, MD  ALPRAZolam Duanne Moron) 1 MG tablet Take 1 mg by mouth 2 (two) times daily.   Yes Historical Provider, MD  atorvastatin (LIPITOR) 10 MG tablet Take 10 mg by mouth daily.   Yes Historical Provider, MD  citalopram (CELEXA) 20 MG tablet Take 20 mg by mouth daily.   Yes Historical Provider, MD  Fluticasone-Salmeterol (ADVAIR) 250-50 MCG/DOSE AEPB Inhale 1 puff into the  lungs 2 (two) times daily.   Yes Historical Provider, MD  levothyroxine (SYNTHROID, LEVOTHROID) 175 MCG tablet TAKE 1 TABLET BY MOUTH EVERY DAY BEFORE BREAKFAST 03/21/15  Yes Cassandria Anger, MD  loratadine (CLARITIN) 10 MG tablet Take 10 mg by mouth daily.   Yes Historical Provider, MD  oxyCODONE-acetaminophen (PERCOCET) 10-325 MG per tablet Take 1 tablet by mouth every 6 (six) hours as needed for pain.   Yes Historical Provider, MD  OXYGEN Inhale 2.5 L into the lungs at bedtime.   Yes Historical Provider, MD  tiotropium (SPIRIVA) 18 MCG inhalation capsule Place 18 mcg into inhaler and inhale daily.   Yes Historical Provider, MD  tiZANidine (ZANAFLEX) 4 MG tablet Take 4 mg by mouth 3 (three) times daily as needed for muscle spasms.   Yes Historical Provider, MD  topiramate (TOPAMAX) 25 MG tablet Take 25 mg by mouth 2 (two) times daily.   Yes Historical Provider, MD  traZODone (DESYREL) 100 MG tablet Take 300 mg by mouth at bedtime.    Yes Historical Provider, MD  Vitamin D, Ergocalciferol, (DRISDOL) 50000 units CAPS capsule Take 1 capsule (50,000 Units total) by mouth once a week. 03/03/15  Yes Cassandria Anger, MD    Allergies:   Allergies  Allergen Reactions  . Estrogens Hives  . Mustard  Seed Hives  . Strawberry Extract Hives    Social History:  reports that she has been smoking.  She does not have any smokeless tobacco history on file. She reports that she does not drink alcohol or use illicit drugs.  Family History: No premature CAD  Physical Exam: Filed Vitals:   03/25/15 2000 03/25/15 2030 03/25/15 2052 03/25/15 2100  BP: 134/80 111/65  120/81  Pulse: 94 94  88  Temp:      TempSrc:      Resp: 23 22  20   Height:      Weight:      SpO2: 88% 86% 88% 94%   General appearance: alert, cooperative and mild distress can speak in truncated sentences Head: Normocephalic, without obvious abnormality, atraumatic Eyes: negative Nose: Nares normal. Septum midline. Mucosa  normal. No drainage or sinus tenderness. Neck: no JVD and supple, symmetrical, trachea midline Lungs: diminished breath sounds bilaterally and wheezes bilaterally Heart: regular rate and rhythm, S1, S2 normal, no murmur, click, rub or gallop Abdomen: soft, non-tender; bowel sounds normal; no masses,  no organomegaly Extremities: extremities normal, atraumatic, no cyanosis or edema Pulses: 2+ and symmetric Skin: Skin color, texture, turgor normal. No rashes or lesions Neurologic: Grossly normal    Labs on Admission:   Recent Labs  03/25/15 1922  NA 136  K 3.3*  CL 100*  CO2 28  GLUCOSE 115*  BUN 8  CREATININE 0.85  CALCIUM 8.7*    Recent Labs  03/25/15 1922  AST 13*  ALT 9*  ALKPHOS 88  BILITOT 0.8  PROT 7.8  ALBUMIN 3.8    Recent Labs  03/25/15 1922  WBC 9.4  NEUTROABS 6.4  HGB 14.3  HCT 44.1  MCV 99.8  PLT 199   Radiological Exams on Admission: Dg Chest 2 View  03/25/2015  CLINICAL DATA:  Shortness of breath for 1 week. Chest discomfort and palpitations. EXAM: CHEST  2 VIEW COMPARISON:  09/26/2012 FINDINGS: Cardiac enlargement with mild pulmonary vascular congestion. Increased opacity over the lung bases likely representing combination of chronic fibrosis and soft tissue attenuation. No definite consolidation or edema. Emphysematous changes in the lungs. No blunting of costophrenic angles. No pneumothorax. IMPRESSION: Emphysematous changes and interstitial changes in the lungs. Cardiac enlargement with mild pulmonary vascular congestion. Electronically Signed   By: Lucienne Capers M.D.   On: 03/25/2015 20:29   ekg reviewed sinus tach no acute issues cxr reviewed no infiltrate CM noted with trace edema  Assessment/Plan  55 yo female with acute on chronic hypoxic respiratory failure due to COPDE  Principal Problem:   COPD exacerbation ([place])-  Asked nurse to get ABG now.  Place on iv solumedrol 80mg  iv q 12 hours.  Place on zpack.  freq nebs.  Will give hour  long neb now.      Active Problems:  Stable unless o/w noted   Acute respiratory failure with hypoxia ([place])-  Will check ddimer if neg no further work up, if elevated will do cta.  Also may be component of chf will order cardiac echo in am, although bnp is normal cxr does look like some mild edema.  Think most of her hypoxia are respiratory.  Quick flu is also pending.   Hyperlipidemia   Essential hypertension, benign   Sleep apnea   Anxiety   Nocturnal hypoxia   Admit to tele bed.  Full code.  Prabhjot Maddux A 03/25/2015, 10:16 PM

## 2015-03-25 NOTE — ED Notes (Signed)
MD at bedside. 

## 2015-03-25 NOTE — ED Notes (Signed)
Patient transported to X-ray 

## 2015-03-25 NOTE — ED Notes (Signed)
RT notified of orders 

## 2015-03-26 ENCOUNTER — Inpatient Hospital Stay (HOSPITAL_COMMUNITY): Payer: Medicaid Other

## 2015-03-26 ENCOUNTER — Encounter (HOSPITAL_COMMUNITY): Payer: Self-pay | Admitting: *Deleted

## 2015-03-26 DIAGNOSIS — E039 Hypothyroidism, unspecified: Secondary | ICD-10-CM

## 2015-03-26 DIAGNOSIS — J9621 Acute and chronic respiratory failure with hypoxia: Secondary | ICD-10-CM

## 2015-03-26 DIAGNOSIS — J189 Pneumonia, unspecified organism: Secondary | ICD-10-CM | POA: Diagnosis present

## 2015-03-26 DIAGNOSIS — J9620 Acute and chronic respiratory failure, unspecified whether with hypoxia or hypercapnia: Secondary | ICD-10-CM | POA: Diagnosis present

## 2015-03-26 DIAGNOSIS — R0600 Dyspnea, unspecified: Secondary | ICD-10-CM

## 2015-03-26 LAB — CBC
HCT: 44.2 % (ref 36.0–46.0)
Hemoglobin: 14.3 g/dL (ref 12.0–15.0)
MCH: 32.2 pg (ref 26.0–34.0)
MCHC: 32.4 g/dL (ref 30.0–36.0)
MCV: 99.5 fL (ref 78.0–100.0)
PLATELETS: 216 10*3/uL (ref 150–400)
RBC: 4.44 MIL/uL (ref 3.87–5.11)
RDW: 13.5 % (ref 11.5–15.5)
WBC: 6.3 10*3/uL (ref 4.0–10.5)

## 2015-03-26 LAB — ECHOCARDIOGRAM COMPLETE
HEIGHTINCHES: 69 in
Weight: 4000.03 oz

## 2015-03-26 LAB — BASIC METABOLIC PANEL
Anion gap: 10 (ref 5–15)
BUN: 14 mg/dL (ref 6–20)
CHLORIDE: 100 mmol/L — AB (ref 101–111)
CO2: 28 mmol/L (ref 22–32)
CREATININE: 1.07 mg/dL — AB (ref 0.44–1.00)
Calcium: 9.1 mg/dL (ref 8.9–10.3)
GFR calc non Af Amer: 57 mL/min — ABNORMAL LOW (ref >60)
GLUCOSE: 173 mg/dL — AB (ref 65–99)
Potassium: 3.6 mmol/L (ref 3.5–5.1)
Sodium: 138 mmol/L (ref 135–145)

## 2015-03-26 LAB — MAGNESIUM: Magnesium: 2.3 mg/dL (ref 1.7–2.4)

## 2015-03-26 LAB — TROPONIN I
Troponin I: <0.03 ng/mL (ref <0.031)
Troponin I: <0.03 ng/mL (ref <0.031)

## 2015-03-26 MED ORDER — BUDESONIDE 0.25 MG/2ML IN SUSP
0.2500 mg | Freq: Two times a day (BID) | RESPIRATORY_TRACT | Status: DC
  Administered 2015-03-26 – 2015-03-29 (×7): 0.25 mg via RESPIRATORY_TRACT
  Filled 2015-03-26 (×7): qty 2

## 2015-03-26 MED ORDER — ATORVASTATIN CALCIUM 10 MG PO TABS
10.0000 mg | ORAL_TABLET | Freq: Every day | ORAL | Status: DC
  Administered 2015-03-26 – 2015-03-29 (×4): 10 mg via ORAL
  Filled 2015-03-26 (×4): qty 1

## 2015-03-26 MED ORDER — CITALOPRAM HYDROBROMIDE 20 MG PO TABS
20.0000 mg | ORAL_TABLET | Freq: Every day | ORAL | Status: DC
  Administered 2015-03-26 – 2015-03-29 (×4): 20 mg via ORAL
  Filled 2015-03-26 (×4): qty 1

## 2015-03-26 MED ORDER — TIZANIDINE HCL 4 MG PO TABS: 4.0000 mg | ORAL_TABLET | Freq: Three times a day (TID) | ORAL | Status: DC | PRN

## 2015-03-26 MED ORDER — ARFORMOTEROL TARTRATE 15 MCG/2ML IN NEBU
15.0000 ug | INHALATION_SOLUTION | Freq: Two times a day (BID) | RESPIRATORY_TRACT | Status: DC
  Administered 2015-03-26 – 2015-03-29 (×6): 15 ug via RESPIRATORY_TRACT
  Filled 2015-03-26 (×6): qty 2

## 2015-03-26 MED ORDER — METHYLPREDNISOLONE SODIUM SUCC 125 MG IJ SOLR
80.0000 mg | Freq: Four times a day (QID) | INTRAMUSCULAR | Status: DC
  Administered 2015-03-26 – 2015-03-27 (×5): 80 mg via INTRAVENOUS
  Filled 2015-03-26 (×5): qty 2

## 2015-03-26 MED ORDER — OXYCODONE-ACETAMINOPHEN 7.5-325 MG PO TABS
1.0000 | ORAL_TABLET | Freq: Four times a day (QID) | ORAL | Status: DC | PRN
  Administered 2015-03-26 – 2015-03-27 (×2): 1 via ORAL
  Filled 2015-03-26 (×2): qty 1

## 2015-03-26 MED ORDER — IPRATROPIUM-ALBUTEROL 0.5-2.5 (3) MG/3ML IN SOLN
3.0000 mL | RESPIRATORY_TRACT | Status: DC | PRN
  Administered 2015-03-27: 3 mL via RESPIRATORY_TRACT
  Filled 2015-03-26: qty 3

## 2015-03-26 MED ORDER — ACETAMINOPHEN 325 MG PO TABS: 650.0000 mg | ORAL_TABLET | Freq: Four times a day (QID) | ORAL | Status: DC | PRN

## 2015-03-26 MED ORDER — DEXTROSE 5 % IV SOLN
1.0000 g | INTRAVENOUS | Status: DC
  Administered 2015-03-26 – 2015-03-28 (×3): 1 g via INTRAVENOUS
  Filled 2015-03-26 (×3): qty 10

## 2015-03-26 MED ORDER — GUAIFENESIN ER 600 MG PO TB12
1200.0000 mg | ORAL_TABLET | Freq: Two times a day (BID) | ORAL | Status: DC
  Administered 2015-03-26 – 2015-03-27 (×4): 1200 mg via ORAL
  Filled 2015-03-26 (×4): qty 2

## 2015-03-26 MED ORDER — LEVOTHYROXINE SODIUM 75 MCG PO TABS
175.0000 ug | ORAL_TABLET | Freq: Every day | ORAL | Status: DC
  Administered 2015-03-26 – 2015-03-29 (×4): 175 ug via ORAL
  Filled 2015-03-26 (×4): qty 1

## 2015-03-26 MED ORDER — FLUTICASONE PROPIONATE 50 MCG/ACT NA SUSP
2.0000 | Freq: Every day | NASAL | Status: DC
  Administered 2015-03-26 – 2015-03-29 (×4): 2 via NASAL
  Filled 2015-03-26: qty 16

## 2015-03-26 MED ORDER — CETYLPYRIDINIUM CHLORIDE 0.05 % MT LIQD
7.0000 mL | Freq: Two times a day (BID) | OROMUCOSAL | Status: DC
Start: 2015-03-26 — End: 2015-03-29
  Administered 2015-03-26 – 2015-03-29 (×8): 7 mL via OROMUCOSAL

## 2015-03-26 MED ORDER — IBUPROFEN 600 MG PO TABS
600.0000 mg | ORAL_TABLET | Freq: Four times a day (QID) | ORAL | Status: DC | PRN
  Administered 2015-03-26: 600 mg via ORAL
  Filled 2015-03-26: qty 1

## 2015-03-26 MED ORDER — TOPIRAMATE 25 MG PO TABS
ORAL_TABLET | ORAL | Status: AC
  Filled 2015-03-26: qty 1

## 2015-03-26 MED ORDER — AZITHROMYCIN 500 MG IV SOLR
500.0000 mg | INTRAVENOUS | Status: DC
  Administered 2015-03-27 – 2015-03-28 (×2): 500 mg via INTRAVENOUS
  Filled 2015-03-26 (×3): qty 500

## 2015-03-26 MED ORDER — LORATADINE 10 MG PO TABS
10.0000 mg | ORAL_TABLET | Freq: Every day | ORAL | Status: DC
  Administered 2015-03-26 – 2015-03-29 (×4): 10 mg via ORAL
  Filled 2015-03-26 (×4): qty 1

## 2015-03-26 MED ORDER — LEVOFLOXACIN IN D5W 500 MG/100ML IV SOLN: 500.0000 mg | INTRAVENOUS | Status: DC

## 2015-03-26 MED ORDER — TOPIRAMATE 25 MG PO TABS
25.0000 mg | ORAL_TABLET | Freq: Two times a day (BID) | ORAL | Status: DC
  Administered 2015-03-26 – 2015-03-29 (×6): 25 mg via ORAL
  Filled 2015-03-26 (×8): qty 1

## 2015-03-26 MED ORDER — OXYCODONE-ACETAMINOPHEN 10-325 MG PO TABS: 1.0000 | ORAL_TABLET | Freq: Four times a day (QID) | ORAL | Status: DC | PRN

## 2015-03-26 MED ORDER — METHYLPREDNISOLONE SODIUM SUCC 125 MG IJ SOLR: 80.0000 mg | Freq: Three times a day (TID) | INTRAMUSCULAR | Status: DC

## 2015-03-26 MED ORDER — IOHEXOL 350 MG/ML SOLN
120.0000 mL | Freq: Once | INTRAVENOUS | Status: AC | PRN
  Administered 2015-03-26: 150 mL via INTRAVENOUS

## 2015-03-26 NOTE — Progress Notes (Signed)
Patient c/o of headache, paged on call MD to order something, will follow orders given an continue to monitor the patient.

## 2015-03-26 NOTE — Consult Note (Addendum)
Consult requested by: [facility] Consult requested for hypoxic respiratory failure:  HPI: This is a 55 year old who had been in her usual state of fair health at home until about a week ago. She says she went to see her primary care physician and was given a pneumonia vaccination and she thought she got sick from that. She was scheduled for a stress test because of chest pain but was unable to do it because she was sick. She eventually came to the emergency department on the 24th where she was found to have COPD exacerbation and acute hypoxic respiratory failure. She says she feels okay but she is requiring mask oxygen at this point. She is coughing nonproductively. She is still short of breath. She denies any chest pain now. She is not having any swelling. She says she has a history of sleep apnea but she lost weight and no longer has that problem.  Past Medical History  Diagnosis Date  . Asthma   . COPD (chronic obstructive pulmonary disease) ([place])   . Anxiety   . Thyroid disease      History reviewed. No pertinent family history.   Social History   Social History  . Marital Status: Single    Spouse Name: N/A  . Number of Children: N/A  . Years of Education: N/A   Social History Main Topics  . Smoking status: Current Every Day Smoker -- 0.50 packs/day  . Smokeless tobacco: None  . Alcohol Use: No  . Drug Use: No  . Sexual Activity: Not Asked   Other Topics Concern  . None   Social History Narrative     ROS: She denies chest pain hemoptysis nausea vomiting diarrhea urinary symptoms. She has had myalgias and headache. Otherwise per the history and physical    Objective: Vital signs in last 24 hours: Temp:  [98.3 F (36.8 C)-98.7 F (37.1 C)] 98.7 F (37.1 C) (03/24 2218) Pulse Rate:  [79-106] 92 (03/25 1010) Resp:  [20-30] 20 (03/25 0240) BP: (111-134)/(65-90) 123/73 mmHg (03/25 1010) SpO2:  [86 %-95 %] 94 % (03/25 1010) FiO2 (%):  [40 %-45 %] 40 % (03/25  0921) Weight:  [113.4 kg (250 lb)-120.203 kg (265 lb)] 113.4 kg (250 lb) (03/25 [id number]) Weight change:  Last BM Date: 03/25/15  Intake/Output from previous day:    PHYSICAL EXAM She is awake and alert. She is moderately obese. She is wearing an oxygen mask. Her pupils are reactive nose and throat are clear her neck is supple without masses or bruits or JVD. Her chest shows prolonged expiration and end expiratory wheezing bilaterally. Her heart is regular without gallop. Her abdomen is soft without masses. She has no edema. Central nervous system examination is grossly intact  Lab Results: Basic Metabolic Panel:  Recent Labs  03/25/15 1922 03/26/15 1006  NA 136 138  K 3.3* 3.6  CL 100* 100*  CO2 28 28  GLUCOSE 115* 173*  BUN 8 14  CREATININE 0.85 1.07*  CALCIUM 8.7* 9.1  MG  --  2.3   Liver Function Tests:  Recent Labs  03/25/15 1922  AST 13*  ALT 9*  ALKPHOS 88  BILITOT 0.8  PROT 7.8  ALBUMIN 3.8   No results for input(s): LIPASE, AMYLASE in the last 72 hours. No results for input(s): AMMONIA in the last 72 hours. CBC:  Recent Labs  03/25/15 1922 03/26/15 1006  WBC 9.4 6.3  NEUTROABS 6.4  --   HGB 14.3 14.3  HCT 44.1 44.2  MCV 99.8 99.5  PLT 199 216   Cardiac Enzymes:  Recent Labs  03/25/15 1908 03/26/15 0435 03/26/15 1006  TROPONINI <0.03 <0.03 <0.03   BNP: No results for input(s): PROBNP in the last 72 hours. D-Dimer:  Recent Labs  03/25/15 1922  DDIMER 0.79*   CBG: No results for input(s): GLUCAP in the last 72 hours. Hemoglobin A1C: No results for input(s): HGBA1C in the last 72 hours. Fasting Lipid Panel: No results for input(s): CHOL, HDL, LDLCALC, TRIG, CHOLHDL, LDLDIRECT in the last 72 hours. Thyroid Function Tests: No results for input(s): TSH, T4TOTAL, FREET4, T3FREE, THYROIDAB in the last 72 hours. Anemia Panel: No results for input(s): VITAMINB12, FOLATE, FERRITIN, TIBC, IRON, RETICCTPCT in the last 72  hours. Coagulation: No results for input(s): LABPROT, INR in the last 72 hours. Urine Drug Screen: Drugs of Abuse  No results found for: LABOPIA, COCAINSCRNUR, LABBENZ, AMPHETMU, THCU, LABBARB  Alcohol Level: No results for input(s): ETH in the last 72 hours. Urinalysis: No results for input(s): COLORURINE, LABSPEC, PHURINE, GLUCOSEU, HGBUR, BILIRUBINUR, KETONESUR, PROTEINUR, UROBILINOGEN, NITRITE, LEUKOCYTESUR in the last 72 hours.  Invalid input(s): APPERANCEUR Misc. Labs:   ABGS:  Recent Labs  03/25/15 2238  PHART 7.424  PO2ART 60.1*  HCO3 27.4*     MICROBIOLOGY: No results found for this or any previous visit (from the past 240 hour(s)).  Studies/Results: Dg Chest 2 View  03/25/2015  CLINICAL DATA:  Shortness of breath for 1 week. Chest discomfort and palpitations. EXAM: CHEST  2 VIEW COMPARISON:  09/26/2012 FINDINGS: Cardiac enlargement with mild pulmonary vascular congestion. Increased opacity over the lung bases likely representing combination of chronic fibrosis and soft tissue attenuation. No definite consolidation or edema. Emphysematous changes in the lungs. No blunting of costophrenic angles. No pneumothorax. IMPRESSION: Emphysematous changes and interstitial changes in the lungs. Cardiac enlargement with mild pulmonary vascular congestion. Electronically Signed   By: Lucienne Capers M.D.   On: 03/25/2015 20:29    Medications:  Prior to Admission:  Prescriptions prior to admission  Medication Sig Dispense Refill Last Dose  . albuterol (PROAIR HFA) 108 (90 BASE) MCG/ACT inhaler Inhale 2 puffs into the lungs every 6 (six) hours as needed for wheezing.   unknown  . albuterol-ipratropium (COMBIVENT) 18-103 MCG/ACT inhaler Inhale into the lungs every 4 (four) hours.   03/25/2015 at Unknown time  . ALPRAZolam (XANAX) 1 MG tablet Take 1 mg by mouth 2 (two) times daily.   03/25/2015 at Unknown time  . atorvastatin (LIPITOR) 10 MG tablet Take 10 mg by mouth daily.    03/24/2015 at Unknown time  . citalopram (CELEXA) 20 MG tablet Take 20 mg by mouth daily.   03/25/2015 at Unknown time  . Fluticasone-Salmeterol (ADVAIR) 250-50 MCG/DOSE AEPB Inhale 1 puff into the lungs 2 (two) times daily.   03/25/2015 at Unknown time  . levothyroxine (SYNTHROID, LEVOTHROID) 175 MCG tablet TAKE 1 TABLET BY MOUTH EVERY DAY BEFORE BREAKFAST 30 tablet 3 03/25/2015 at Unknown time  . loratadine (CLARITIN) 10 MG tablet Take 10 mg by mouth daily.   03/25/2015 at Unknown time  . oxyCODONE-acetaminophen (PERCOCET) 10-325 MG per tablet Take 1 tablet by mouth every 6 (six) hours as needed for pain.   Past Week at Unknown time  . OXYGEN Inhale 2.5 L into the lungs at bedtime.   03/24/2015 at Unknown time  . tiotropium (SPIRIVA) 18 MCG inhalation capsule Place 18 mcg into inhaler and inhale daily.   03/25/2015 at Unknown time  . tiZANidine (ZANAFLEX)  4 MG tablet Take 4 mg by mouth 3 (three) times daily as needed for muscle spasms.   03/24/2015 at Unknown time  . topiramate (TOPAMAX) 25 MG tablet Take 25 mg by mouth 2 (two) times daily.   03/25/2015 at Unknown time  . traZODone (DESYREL) 100 MG tablet Take 300 mg by mouth at bedtime.    unknown  . Vitamin D, Ergocalciferol, (DRISDOL) 50000 units CAPS capsule Take 1 capsule (50,000 Units total) by mouth once a week. 12 capsule 0 03/13/2015 at Unknown time   Scheduled: . antiseptic oral rinse  7 mL Mouth Rinse BID  . arformoterol  15 mcg Nebulization BID  . [START ON 03/27/2015] azithromycin  250 mg Oral Daily  . budesonide (PULMICORT) nebulizer solution  0.25 mg Nebulization BID  . guaiFENesin  1,200 mg Oral BID  . ipratropium-albuterol  3 mL Nebulization Q6H  . levofloxacin (LEVAQUIN) IV  500 mg Intravenous Q24H  . methylPREDNISolone (SOLU-MEDROL) injection  80 mg Intravenous Q6H  . sodium chloride flush  3 mL Intravenous Q12H  . sodium chloride flush  3 mL Intravenous Q12H   Continuous:  [id number] chloride, acetaminophen, ibuprofen, iohexol,  ipratropium-albuterol, sodium chloride flush  Assesment: She has acute on chronic hypoxic respiratory failure with COPD exacerbation. Although she has a history of sleep apnea she says she does not have that now. She uses oxygen at night now. She is requiring pretty high flow oxygen during the day at this point. She is on appropriate treatment. She does not have pneumonia by chest x-ray. Principal Problem:   Acute on chronic respiratory failure (HCC) Active Problems:   Hyperlipidemia   Essential hypertension, benign   Sleep apnea   Anxiety   Nocturnal hypoxia   Acute respiratory failure with hypoxia (HCC)   COPD exacerbation (HCC)   COPD (chronic obstructive pulmonary disease) (HCC)    Plan: Continue inhaled bronchodilators and IV steroids antibiotics. Continue oxygen and taper as tolerated. Agree with chest ct angiogram    LOS: 1 day   Ezra Denne L 03/26/2015, 10:56 AM

## 2015-03-26 NOTE — Progress Notes (Signed)
*  PRELIMINARY RESULTS* Echocardiogram 2D Echocardiogram has been performed.  Leavy Cella 03/26/2015, 11:52 AM

## 2015-03-26 NOTE — Progress Notes (Signed)
[place] for Renal Adjustment of ABX if needed  Orders for Zithromax and Rocephin  Allergies  Allergen Reactions  . Estrogens Hives  . Mustard [employer]  . Strawberry Extract Hives   Patient Measurements: Height: 5\' 9"  (175.3 cm) Weight: 250 lb (113.4 kg) IBW/kg (Calculated) : 66.2  Vital Signs: BP: 123/73 mmHg (03/25 1010) Pulse Rate: 92 (03/25 1010)  Labs:  Recent Labs  03/25/15 1922 03/26/15 1006  WBC 9.4 6.3  HGB 14.3 14.3  PLT 199 216  CREATININE 0.85 1.07*   No results for input(s): VANCOTROUGH, VANCOPEAK, VANCORANDOM, GENTTROUGH, GENTPEAK, GENTRANDOM, TOBRATROUGH, TOBRAPEAK, TOBRARND, AMIKACINPEAK, AMIKACINTROU, AMIKACIN in the last 72 hours.   Medical History: Past Medical History  Diagnosis Date  . Asthma   . COPD (chronic obstructive pulmonary disease) ([place])   . Anxiety   . Thyroid disease    Assessment: 55yo female.  Obese.  Good renal fxn.  Levaquin has been d/c'd (not given)  Estimated Creatinine Clearance: 78.9 mL/min (by C-G formula based on Cr of 1.07).  Plan: Continue current Rx (renal adjustment not needed)  Ena Dawley, [facility] 03/26/2015

## 2015-03-26 NOTE — Progress Notes (Signed)
[facility] PROGRESS NOTE  Terri Casey [id number] DOB: 07/11/1960 DOA: 03/25/2015 PCP: No primary care provider on file.  Assessment/Plan: #1 acute on chronic respiratory failure with hypoxia likely secondary to COPD exacerbation Patient presenting with acute respiratory failure with hypoxia felt to be secondary to his COPD exacerbation as patient was noted to have diffuse wheezing and was tight. Patient on 8 L Venturi mask is still using accessory muscles of respiration. ABG obtained at a pH of 7.42 PCO2 of 44 PO2 of 60 by cup of 27. Patient also did have complaints of chest pain which she states has been ongoing for several weeks as she was supposed to be set up for stress test per her PCP prior to her presentation. Will check a CT angiogram of the chest to rule out PE. Continue empiric antibiotics. Add IV Levaquin. Add Pulmicort and Brovana. Increase Mucinex to 1200 mg twice daily. Continue IV steroids. Continue scheduled nebulizers. Add Claritin and Flonase. Consult with pulmonary for further evaluation and management.  #2 chest pain Patient states she has been having ongoing for several weeks and PCP was getting ready to set up for outpatient stress test. Patient presented with acute respiratory failure noted to be hypoxic. Will check a CT angiogram of the chest to rule out PE. Cycle cardiac enzymes every 6 hours 3. Check a 2-D echo. Follow.  #3 hypertension Stable. Follow.  #4 hyperlipidemia Check a fasting lipid panel. Resume home dose Lipitor.  #5 hypothyroidism Repeat TSH. Resume synthroid.  #6 sleep apnea  #7 prophylaxis Lovenox for DVT prophylaxis.  Code Status: Full Family Communication: Updated patient. No family present. Disposition Plan: Home once acute respiratory failure has improved and patient closed to baseline and tolerating oral intake.   Consultants:  Pulmonary: Dr. Luan Pulling 03/26/2015  Procedures:  Chest x-ray  03/25/2015  Antibiotics:  Azithromycin 03/26/2015  IV Levaquin 03/26/2015  HPI/Subjective: Patient states some improvement with SOB and chest pain. Patient stated that her PCP had set her up for outpatient stress test prior to her acute respiratory distress.  Objective: Filed Vitals:   03/25/15 2218 03/26/15 0240  BP: 117/67   Pulse: 93 79  Temp: 98.7 F (37.1 C)   Resp: 22 20   No intake or output data in the 24 hours ending 03/26/15 1011 Filed Weights   03/25/15 1904 03/25/15 2218 03/26/15 0658  Weight: 120.203 kg (265 lb) 114.397 kg (252 lb 3.2 oz) 113.4 kg (250 lb)    Exam:   General:  On Venturi mask. Use of accesory muscles of respiration  Cardiovascular: RRR  Respiratory: Poor air movement. Expiratory wheezing, tight  Abdomen: Soft, nontender, nondistended, positive bowel sounds.  Musculoskeletal: No clubbing cyanosis or edema.  Data Reviewed: Basic Metabolic Panel:  Recent Labs Lab 03/25/15 1922  NA 136  K 3.3*  CL 100*  CO2 28  GLUCOSE 115*  BUN 8  CREATININE 0.85  CALCIUM 8.7*   Liver Function Tests:  Recent Labs Lab 03/25/15 1922  AST 13*  ALT 9*  ALKPHOS 88  BILITOT 0.8  PROT 7.8  ALBUMIN 3.8   No results for input(s): LIPASE, AMYLASE in the last 168 hours. No results for input(s): AMMONIA in the last 168 hours. CBC:  Recent Labs Lab 03/25/15 1922  WBC 9.4  NEUTROABS 6.4  HGB 14.3  HCT 44.1  MCV 99.8  PLT 199   Cardiac Enzymes:  Recent Labs Lab 03/25/15 1908 03/26/15 0435  TROPONINI <0.03 <0.03   BNP (last 3 results)  Recent Labs  03/25/15 1922  BNP 33.0    ProBNP (last 3 results) No results for input(s): PROBNP in the last 8760 hours.  CBG: No results for input(s): GLUCAP in the last 168 hours.  No results found for this or any previous visit (from the past 240 hour(s)).   Studies: Dg Chest 2 View  03/25/2015  CLINICAL DATA:  Shortness of breath for 1 week. Chest discomfort and palpitations. EXAM:  CHEST  2 VIEW COMPARISON:  09/26/2012 FINDINGS: Cardiac enlargement with mild pulmonary vascular congestion. Increased opacity over the lung bases likely representing combination of chronic fibrosis and soft tissue attenuation. No definite consolidation or edema. Emphysematous changes in the lungs. No blunting of costophrenic angles. No pneumothorax. IMPRESSION: Emphysematous changes and interstitial changes in the lungs. Cardiac enlargement with mild pulmonary vascular congestion. Electronically Signed   By: Lucienne Capers M.D.   On: 03/25/2015 20:29    Scheduled Meds: . antiseptic oral rinse  7 mL Mouth Rinse BID  . arformoterol  15 mcg Nebulization BID  . azithromycin  500 mg Oral Daily   Followed by  . [START ON 03/27/2015] azithromycin  250 mg Oral Daily  . budesonide (PULMICORT) nebulizer solution  0.25 mg Nebulization BID  . guaiFENesin  1,200 mg Oral BID  . ipratropium-albuterol  3 mL Nebulization Q6H  . levofloxacin (LEVAQUIN) IV  500 mg Intravenous Q24H  . methylPREDNISolone (SOLU-MEDROL) injection  80 mg Intravenous 3 times per day  . sodium chloride flush  3 mL Intravenous Q12H  . sodium chloride flush  3 mL Intravenous Q12H   Continuous Infusions:   Principal Problem:   Acute on chronic respiratory failure (HCC) Active Problems:   COPD exacerbation (HCC)   Hyperlipidemia   Essential hypertension, benign   Sleep apnea   Anxiety   Nocturnal hypoxia   Acute respiratory failure with hypoxia (HCC)   COPD (chronic obstructive pulmonary disease) ([place])    Time spent: 84 minutes    Terri Casey M.D. [facility] Pager [phone]. If 7PM-7AM, please contact night-coverage at [email or web address], password [facility] 03/26/2015, 10:11 AM  LOS: 1 day

## 2015-03-27 DIAGNOSIS — F419 Anxiety disorder, unspecified: Secondary | ICD-10-CM

## 2015-03-27 DIAGNOSIS — I313 Pericardial effusion (noninflammatory): Secondary | ICD-10-CM | POA: Diagnosis present

## 2015-03-27 DIAGNOSIS — J189 Pneumonia, unspecified organism: Secondary | ICD-10-CM

## 2015-03-27 DIAGNOSIS — I3139 Other pericardial effusion (noninflammatory): Secondary | ICD-10-CM | POA: Diagnosis present

## 2015-03-27 LAB — COMPREHENSIVE METABOLIC PANEL
ALBUMIN: 3.5 g/dL (ref 3.5–5.0)
ALT: 13 U/L — ABNORMAL LOW (ref 14–54)
AST: 16 U/L (ref 15–41)
Alkaline Phosphatase: 76 U/L (ref 38–126)
Anion gap: 10 (ref 5–15)
BILIRUBIN TOTAL: 0.5 mg/dL (ref 0.3–1.2)
BUN: 20 mg/dL (ref 6–20)
CHLORIDE: 102 mmol/L (ref 101–111)
CO2: 27 mmol/L (ref 22–32)
Calcium: 9.1 mg/dL (ref 8.9–10.3)
Creatinine, Ser: 0.87 mg/dL (ref 0.44–1.00)
GFR calc Af Amer: >60 mL/min (ref >60)
GFR calc non Af Amer: >60 mL/min (ref >60)
GLUCOSE: 141 mg/dL — AB (ref 65–99)
POTASSIUM: 4.1 mmol/L (ref 3.5–5.1)
SODIUM: 139 mmol/L (ref 135–145)
TOTAL PROTEIN: 7.3 g/dL (ref 6.5–8.1)

## 2015-03-27 LAB — CBC
HEMATOCRIT: 42 % (ref 36.0–46.0)
Hemoglobin: 13.4 g/dL (ref 12.0–15.0)
MCH: 31.9 pg (ref 26.0–34.0)
MCHC: 31.9 g/dL (ref 30.0–36.0)
MCV: 100 fL (ref 78.0–100.0)
Platelets: 265 10*3/uL (ref 150–400)
RBC: 4.2 MIL/uL (ref 3.87–5.11)
RDW: 13.7 % (ref 11.5–15.5)
WBC: 11.4 10*3/uL — AB (ref 4.0–10.5)

## 2015-03-27 LAB — LIPID PANEL
CHOL/HDL RATIO: 4.1 ratio
Cholesterol: 131 mg/dL (ref 0–200)
HDL: 32 mg/dL — AB (ref >40)
LDL CALC: 85 mg/dL (ref 0–99)
TRIGLYCERIDES: 71 mg/dL (ref <150)
VLDL: 14 mg/dL (ref 0–40)

## 2015-03-27 LAB — MAGNESIUM: MAGNESIUM: 2.2 mg/dL (ref 1.7–2.4)

## 2015-03-27 LAB — HIV ANTIBODY (ROUTINE TESTING W REFLEX): HIV Screen 4th Generation wRfx: NONREACTIVE

## 2015-03-27 LAB — STREP PNEUMONIAE URINARY ANTIGEN: STREP PNEUMO URINARY ANTIGEN: NEGATIVE

## 2015-03-27 LAB — TSH: TSH: 5.693 u[IU]/mL — ABNORMAL HIGH (ref 0.350–4.500)

## 2015-03-27 MED ORDER — METHYLPREDNISOLONE SODIUM SUCC 125 MG IJ SOLR
80.0000 mg | Freq: Three times a day (TID) | INTRAMUSCULAR | Status: DC
  Administered 2015-03-27 – 2015-03-29 (×5): 80 mg via INTRAVENOUS
  Filled 2015-03-27 (×5): qty 2

## 2015-03-27 MED ORDER — ALPRAZOLAM 1 MG PO TABS
1.0000 mg | ORAL_TABLET | Freq: Two times a day (BID) | ORAL | Status: DC
  Administered 2015-03-27 – 2015-03-29 (×5): 1 mg via ORAL
  Filled 2015-03-27 (×5): qty 1

## 2015-03-27 MED ORDER — SENNOSIDES-DOCUSATE SODIUM 8.6-50 MG PO TABS
2.0000 | ORAL_TABLET | Freq: Two times a day (BID) | ORAL | Status: DC
  Administered 2015-03-27 – 2015-03-29 (×4): 2 via ORAL
  Filled 2015-03-27 (×5): qty 2

## 2015-03-27 NOTE — Progress Notes (Signed)
[facility] PROGRESS NOTE  Terri Casey [id number] DOB: Nov 19, 1960 DOA: 03/25/2015 PCP: No primary care provider on file.  Assessment/Plan: #1 acute on chronic respiratory failure with hypoxia likely secondary to COPD exacerbation and CAP Patient presenting with acute respiratory failure with hypoxia felt to be secondary to his COPD exacerbation and a community-acquired pneumonia noted per CT chest.  Patient with clinical improvement and now on nasal cannula oxygen.  On 03/26/2015 patient was on 8 L Venturi mask and using accessory muscles of respiration. ABG obtained at a pH of 7.42 PCO2 of 44 PO2 of 60 by cup of 27. Patient also did have complaints of chest pain which she states has been ongoing for several weeks as she was supposed to be set up for stress test per her PCP prior to her presentation.  CT angiogram of chest negative for PE,however consistent with a bibasilar pneumonia.. Continue empiric antibiotics of azithromycin. IV Levaquin has been changed to IV Rocephin. Continue Pulmicort and Brovana. Increased Mucinex to 1200 mg twice daily. Continue IV steroids taper. Continue scheduled nebulizers, Claritin and Flonase.  Pulmonary following and appreciate input and recommendations.  #2 chest pain/ moderate-sized pericardial effusion. 2-D echo 03/26/2015 Patient states she has been having ongoing for several weeks and PCP was getting ready to set up for outpatient stress test. Patient presented with acute respiratory failure noted to be hypoxic. CT angiogram of the chest negative for PE however consistent with bibasilar pneumonia.  2-D echo with a EF of 60-65% with grade 1 diastolic dysfunction. CVP of 8. Moderate sized pericardial effusion noted adjacent to the right-sided chambers. Features not consistent with tamponade physiology. Will consult with cardiology for evaluation of pericardial effusion.  #3 hypertension Stable. Follow.  #4 hyperlipidemia  fasting lipid panel with  LDL of 85. Continue home dose Lipitor.  #5 hypothyroidism Repeat TSH 5.693. Resumed synthroid.  #6 sleep apnea  #7 prophylaxis Lovenox for DVT prophylaxis.  Code Status: Full Family Communication: Updated patient. No family present. Disposition Plan: Home once acute respiratory failure has improved and patient close to baseline and tolerating oral intake.   Consultants:  Pulmonary: Dr. Luan Pulling 03/26/2015  Procedures:  Chest x-ray 03/25/2015   CT head angiogram chest 03/26/2015   2-D echo 03/26/2015  Antibiotics:  Azithromycin 03/26/2015  IV Levaquin 03/26/2015>>>> 03/26/2015   IV Rocephin 03/26/2015  HPI/Subjective: Patient states  Shortness of breath and chest pain has improved. Patient states she's starting to cough.  Objective: Filed Vitals:   03/26/15 2107 03/27/15 0622  BP: 120/67 138/79  Pulse: 81 64  Temp: 97.9 F (36.6 C) 97.5 F (36.4 C)  Resp: 20 20    Intake/Output Summary (Last 24 hours) at 03/27/15 1159 Last data filed at 03/26/15 1700  Gross per 24 hour  Intake    480 ml  Output    300 ml  Net    180 ml   Filed Weights   03/25/15 2218 03/26/15 0658 03/27/15 0622  Weight: 114.397 kg (252 lb 3.2 oz) 113.4 kg (250 lb) 113.626 kg (250 lb 8 oz)    Exam:   General:  On [place].  Speaking in full sentences. No use of accessory muscles of respiration.  Cardiovascular: RRR  Respiratory: Poor-fair air movement. Expiratory wheezing,  Less tight  Abdomen: Soft, nontender, nondistended, positive bowel sounds.  Musculoskeletal: No clubbing cyanosis or edema.  Data Reviewed: Basic Metabolic Panel:  Recent Labs Lab 03/25/15 1922 03/26/15 1006 03/27/15 0557  NA 136 138 139  K 3.3* 3.6  4.1  CL 100* 100* 102  CO2 28 28 27   GLUCOSE 115* 173* 141*  BUN 8 14 20   CREATININE 0.85 1.07* 0.87  CALCIUM 8.7* 9.1 9.1  MG  --  2.3 2.2   Liver Function Tests:  Recent Labs Lab 03/25/15 1922 03/27/15 0557  AST 13* 16  ALT 9* 13*  ALKPHOS 88  76  BILITOT 0.8 0.5  PROT 7.8 7.3  ALBUMIN 3.8 3.5   No results for input(s): LIPASE, AMYLASE in the last 168 hours. No results for input(s): AMMONIA in the last 168 hours. CBC:  Recent Labs Lab 03/25/15 1922 03/26/15 1006 03/27/15 0557  WBC 9.4 6.3 11.4*  NEUTROABS 6.4  --   --   HGB 14.3 14.3 13.4  HCT 44.1 44.2 42.0  MCV 99.8 99.5 100.0  PLT 199 216 265   Cardiac Enzymes:  Recent Labs Lab 03/25/15 1908 03/26/15 0435 03/26/15 1006  TROPONINI <0.03 <0.03 <0.03   BNP (last 3 results)  Recent Labs  03/25/15 1922  BNP 33.0    ProBNP (last 3 results) No results for input(s): PROBNP in the last 8760 hours.  CBG: No results for input(s): GLUCAP in the last 168 hours.  Recent Results (from the past 240 hour(s))  Culture, blood (routine x 2) Call MD if unable to obtain prior to antibiotics being given     Status: None (Preliminary result)   Collection Time: 03/26/15  3:02 PM  Result Value Ref Range Status   Specimen Description BLOOD LEFT HAND  Final   Special Requests BOTTLES DRAWN AEROBIC AND ANAEROBIC [place]  Final   Culture PENDING  Incomplete   Report Status PENDING  Incomplete  Culture, blood (routine x 2) Call MD if unable to obtain prior to antibiotics being given     Status: None (Preliminary result)   Collection Time: 03/26/15  3:09 PM  Result Value Ref Range Status   Specimen Description BLOOD RIGHT HAND  Final   Special Requests BOTTLES DRAWN AEROBIC AND ANAEROBIC [place]  Final   Culture PENDING  Incomplete   Report Status PENDING  Incomplete     Studies: Dg Chest 2 View  03/25/2015  CLINICAL DATA:  Shortness of breath for 1 week. Chest discomfort and palpitations. EXAM: CHEST  2 VIEW COMPARISON:  09/26/2012 FINDINGS: Cardiac enlargement with mild pulmonary vascular congestion. Increased opacity over the lung bases likely representing combination of chronic fibrosis and soft tissue attenuation. No definite consolidation or edema. Emphysematous  changes in the lungs. No blunting of costophrenic angles. No pneumothorax. IMPRESSION: Emphysematous changes and interstitial changes in the lungs. Cardiac enlargement with mild pulmonary vascular congestion. Electronically Signed   By: Lucienne Capers M.D.   On: 03/25/2015 20:29   Ct Angio Chest Pe W/cm &/or Wo Cm  03/26/2015  CLINICAL DATA:  Shortness of breath, COPD, asthma EXAM: CT ANGIOGRAPHY CHEST WITH CONTRAST TECHNIQUE: Multidetector CT imaging of the chest was performed using the standard protocol during bolus administration of intravenous contrast. Multiplanar CT image reconstructions and MIPs were obtained to evaluate the vascular anatomy. CONTRAST:  178mL OMNIPAQUE IOHEXOL 350 MG/ML SOLN COMPARISON:  CT of the chest 09/26/2012 and x-ray 03/25/2015 FINDINGS: Mediastinum/Lymph Nodes: Small to moderate anterior pericardial effusion is stable. Cardiomediastinal silhouette is stable. There is no mediastinal adenopathy. Borderline enlarged right infrahilar lymph node axial image 59 is stable from prior exam measures 1.3 cm in diameter without change from prior exam. This is probable reactive. No pulmonary embolus is noted. Left hilar  lymph node measures 1.1 short-axis borderline enlarged by size criteria. Central airways are patent. No mediastinal hematoma. Mild atherosclerotic calcifications of thoracic aorta. Lungs/Pleura: Mild atelectasis noted in right middle lobe and lingula. Small peripheral tree-in-bud infiltrates are noted bilateral lower lobe highly suspicious for atypical infection. Please see axial images 81 and 79 on lung window images. There is additional peripheral consolidation with some air bronchogram in left lower lobe posterior medially suspicious for focal pneumonia. There is no pleural effusion. No pleural thickening. Mild bronchitic changes are noted bilateral lower lobe. Bilateral emphysematous changes again noted. Upper abdomen: The visualized liver is unremarkable. Visualized  pancreas and spleen are unremarkable. No adrenal gland mass is noted. Visualized upper kidneys are unremarkable. Musculoskeletal: Sagittal images of the spine shows mild degenerative changes thoracic spine. Sagittal view of the sternum is unremarkable. No destructive rib lesions are noted. Review of the MIP images confirms the above findings. IMPRESSION: 1. No pulmonary embolus is noted. 2. Again noted bilateral borderline hilar adenopathy probable reactive. 3. Patchy peripheral tree-in-bud infiltrates are noted bilateral lower lobe. Findings are highly suspicious for atypical infection. Clinical correlation is necessary. There is additional consolidation with some air bronchogram in left lower lobe posterior medially highly suspicious for focal pneumonia. Mild bronchitic changes are noted bilateral lower lobe. 4. Mild atelectasis noted in right middle lobe and lingula posteriorly. 5. Stable anterior pericardial effusion.  No cardiomegaly. 6. Mild atherosclerotic calcifications of thoracic aorta. Electronically Signed   By: Lahoma Crocker M.D.   On: 03/26/2015 14:08    Scheduled Meds: . ALPRAZolam  1 mg Oral BID  . antiseptic oral rinse  7 mL Mouth Rinse BID  . arformoterol  15 mcg Nebulization BID  . atorvastatin  10 mg Oral Daily  . azithromycin  500 mg Intravenous Q24H  . budesonide (PULMICORT) nebulizer solution  0.25 mg Nebulization BID  . cefTRIAXone (ROCEPHIN)  IV  1 g Intravenous Q24H  . citalopram  20 mg Oral Daily  . fluticasone  2 spray Each Nare Daily  . guaiFENesin  1,200 mg Oral BID  . ipratropium-albuterol  3 mL Nebulization Q6H  . levothyroxine  175 mcg Oral QAC breakfast  . loratadine  10 mg Oral Daily  . methylPREDNISolone (SOLU-MEDROL) injection  80 mg Intravenous Q6H  . sodium chloride flush  3 mL Intravenous Q12H  . sodium chloride flush  3 mL Intravenous Q12H  . topiramate  25 mg Oral BID   Continuous Infusions:   Principal Problem:   Acute on chronic respiratory failure  (HCC) Active Problems:   COPD exacerbation (HCC)   PNA (pneumonia)   Hyperlipidemia   Essential hypertension, benign   Sleep apnea   Anxiety   Nocturnal hypoxia   Acute respiratory failure with hypoxia (HCC)   COPD (chronic obstructive pulmonary disease) (HCC)   Pericardial effusion: Per 2 d echo 03/26/2015    Time spent: 34 minutes    [facility] M.D. [facility] Pager [phone]. If 7PM-7AM, please contact night-coverage at [email or web address], password [facility] 03/27/2015, 11:59 AM  LOS: 2 days

## 2015-03-27 NOTE — Progress Notes (Signed)
Sputum cup given to patient with instruction about if she coughs up anything with the given of treatments, flutter valve and IS to put in cup and let someone know.

## 2015-03-27 NOTE — Progress Notes (Signed)
Subjective: She says she feels better. She is getting better as far as her breathing is concerned. Her oxygen has been tapered  Objective: Vital signs in last 24 hours: Temp:  [97.5 F (36.4 C)-97.9 F (36.6 C)] 97.5 F (36.4 C) (03/26 0622) Pulse Rate:  [64-95] 64 (03/26 0622) Resp:  [18-20] 20 (03/26 0622) BP: (120-138)/(67-79) 138/79 mmHg (03/26 0622) SpO2:  [92 %-97 %] 95 % (03/26 1013) Weight:  [113.626 kg (250 lb 8 oz)] 113.626 kg (250 lb 8 oz) (03/26 0622) Weight change: -6.577 kg (-14 lb 8 oz) Last BM Date: 03/25/15  Intake/Output from previous day: 03/25 0701 - 03/26 0700 In: 480 [P.O.:480] Out: 300 [Urine:300]  PHYSICAL EXAM General appearance: alert, cooperative and no distress Resp: rhonchi bilaterally Cardio: regular rate and rhythm, S1, S2 normal, no murmur, click, rub or gallop GI: soft, non-tender; bowel sounds normal; no masses,  no organomegaly Extremities: extremities normal, atraumatic, no cyanosis or edema  Lab Results:  Results for orders placed or performed during the hospital encounter of 03/25/15 (from the past 48 hour(s))  Troponin I     Status: None   Collection Time: 03/25/15  7:08 PM  Result Value Ref Range   Troponin I <0.03 <0.031 ng/mL    Comment:        NO INDICATION OF MYOCARDIAL INJURY.   CBC with Differential/Platelet     Status: None   Collection Time: 03/25/15  7:22 PM  Result Value Ref Range   WBC 9.4 4.0 - 10.5 K/uL   RBC 4.42 3.87 - 5.11 MIL/uL   Hemoglobin 14.3 12.0 - 15.0 g/dL   HCT 44.1 36.0 - 46.0 %   MCV 99.8 78.0 - 100.0 fL   MCH 32.4 26.0 - 34.0 pg   MCHC 32.4 30.0 - 36.0 g/dL   RDW 13.6 11.5 - 15.5 %   Platelets 199 150 - 400 K/uL   Neutrophils Relative % 68 %   Neutro Abs 6.4 1.7 - 7.7 K/uL   Lymphocytes Relative 24 %   Lymphs Abs 2.3 0.7 - 4.0 K/uL   Monocytes Relative 8 %   Monocytes Absolute 0.8 0.1 - 1.0 K/uL   Eosinophils Relative 0 %   Eosinophils Absolute 0.0 0.0 - 0.7 K/uL   Basophils Relative 0 %    Basophils Absolute 0.0 0.0 - 0.1 K/uL  Comprehensive metabolic panel     Status: Abnormal   Collection Time: 03/25/15  7:22 PM  Result Value Ref Range   Sodium 136 135 - 145 mmol/L   Potassium 3.3 (L) 3.5 - 5.1 mmol/L   Chloride 100 (L) 101 - 111 mmol/L   CO2 28 22 - 32 mmol/L   Glucose, Bld 115 (H) 65 - 99 mg/dL   BUN 8 6 - 20 mg/dL   Creatinine, Ser 0.85 0.44 - 1.00 mg/dL   Calcium 8.7 (L) 8.9 - 10.3 mg/dL   Total Protein 7.8 6.5 - 8.1 g/dL   Albumin 3.8 3.5 - 5.0 g/dL   AST 13 (L) 15 - 41 U/L   ALT 9 (L) 14 - 54 U/L   Alkaline Phosphatase 88 38 - 126 U/L   Total Bilirubin 0.8 0.3 - 1.2 mg/dL   GFR calc non Af Amer >60 >60 mL/min   GFR calc Af Amer >60 >60 mL/min    Comment: (NOTE) The eGFR has been calculated using the CKD EPI equation. This calculation has not been validated in all clinical situations. eGFR's persistently <60 mL/min signify possible Chronic Kidney  Disease.    Anion gap 8 5 - 15  Brain natriuretic peptide     Status: None   Collection Time: 03/25/15  7:22 PM  Result Value Ref Range   B Natriuretic Peptide 33.0 0.0 - 100.0 pg/mL  D-dimer, quantitative (not at [facility])     Status: Abnormal   Collection Time: 03/25/15  7:22 PM  Result Value Ref Range   D-Dimer, Quant 0.79 (H) 0.00 - 0.50 ug/mL-FEU    Comment: (NOTE) At the manufacturer cut-off of 0.50 ug/mL FEU, this assay has been documented to exclude PE with a sensitivity and negative predictive value of 97 to 99%.  At this time, this assay has not been approved by the FDA to exclude DVT/VTE. Results should be correlated with clinical presentation.   I-stat troponin, ED     Status: None   Collection Time: 03/25/15  7:38 PM  Result Value Ref Range   Troponin i, poc 0.00 0.00 - 0.08 ng/mL   Comment 3            Comment: Due to the release kinetics of cTnI, a negative result within the first hours of the onset of symptoms does not rule out myocardial infarction with certainty. If myocardial infarction  is still suspected, repeat the test at appropriate intervals.   I-Stat CG4 Lactic Acid, ED     Status: None   Collection Time: 03/25/15  7:40 PM  Result Value Ref Range   Lactic Acid, Venous 0.84 0.5 - 2.0 mmol/L  Influenza panel by PCR (type A & B, H1N1)     Status: None   Collection Time: 03/25/15  8:30 PM  Result Value Ref Range   Influenza A By PCR NEGATIVE NEGATIVE   Influenza B By PCR NEGATIVE NEGATIVE   H1N1 flu by pcr NOT DETECTED NOT DETECTED    Comment:        The Xpert Flu assay (FDA approved for nasal aspirates or washes and nasopharyngeal swab specimens), is intended as an aid in the diagnosis of influenza and should not be used as a sole basis for treatment.   Blood gas, arterial     Status: Abnormal   Collection Time: 03/25/15 10:38 PM  Result Value Ref Range   FIO2 0.45    Delivery systems OXYGEN MASK    pH, Arterial 7.424 7.350 - 7.450   pCO2 arterial 43.6 35.0 - 45.0 mmHg   pO2, Arterial 60.1 (L) 80.0 - 100.0 mmHg   Bicarbonate 27.4 (H) 20.0 - 24.0 mEq/L   Acid-Base Excess 3.8 (H) 0.0 - 2.0 mmol/L   O2 Saturation 91.4 %   Collection site BRACHIAL ARTERY    Drawn by [id number]    Sample type ARTERIAL    Allens test (pass/fail) NOT INDICATED (A) PASS  Troponin I     Status: None   Collection Time: 03/26/15  4:35 AM  Result Value Ref Range   Troponin I <0.03 <0.031 ng/mL    Comment:        NO INDICATION OF MYOCARDIAL INJURY.   Troponin I     Status: None   Collection Time: 03/26/15 10:06 AM  Result Value Ref Range   Troponin I <0.03 <0.031 ng/mL    Comment:        NO INDICATION OF MYOCARDIAL INJURY.   Basic metabolic panel     Status: Abnormal   Collection Time: 03/26/15 10:06 AM  Result Value Ref Range   Sodium 138 135 - 145 mmol/L   Potassium  3.6 3.5 - 5.1 mmol/L   Chloride 100 (L) 101 - 111 mmol/L   CO2 28 22 - 32 mmol/L   Glucose, Bld 173 (H) 65 - 99 mg/dL   BUN 14 6 - 20 mg/dL   Creatinine, Ser 1.07 (H) 0.44 - 1.00 mg/dL   Calcium 9.1 8.9  - 10.3 mg/dL   GFR calc non Af Amer 57 (L) >60 mL/min   GFR calc Af Amer >60 >60 mL/min    Comment: (NOTE) The eGFR has been calculated using the CKD EPI equation. This calculation has not been validated in all clinical situations. eGFR's persistently <60 mL/min signify possible Chronic Kidney Disease.    Anion gap 10 5 - 15  CBC     Status: None   Collection Time: 03/26/15 10:06 AM  Result Value Ref Range   WBC 6.3 4.0 - 10.5 K/uL   RBC 4.44 3.87 - 5.11 MIL/uL   Hemoglobin 14.3 12.0 - 15.0 g/dL   HCT 44.2 36.0 - 46.0 %   MCV 99.5 78.0 - 100.0 fL   MCH 32.2 26.0 - 34.0 pg   MCHC 32.4 30.0 - 36.0 g/dL   RDW 13.5 11.5 - 15.5 %   Platelets 216 150 - 400 K/uL  Magnesium     Status: None   Collection Time: 03/26/15 10:06 AM  Result Value Ref Range   Magnesium 2.3 1.7 - 2.4 mg/dL  Culture, blood (routine x 2) Call MD if unable to obtain prior to antibiotics being given     Status: None (Preliminary result)   Collection Time: 03/26/15  3:02 PM  Result Value Ref Range   Specimen Description BLOOD LEFT HAND    Special Requests BOTTLES DRAWN AEROBIC AND ANAEROBIC [facility] EACH    Culture PENDING    Report Status PENDING   Culture, blood (routine x 2) Call MD if unable to obtain prior to antibiotics being given     Status: None (Preliminary result)   Collection Time: 03/26/15  3:09 PM  Result Value Ref Range   Specimen Description BLOOD RIGHT HAND    Special Requests BOTTLES DRAWN AEROBIC AND ANAEROBIC [place]    Culture PENDING    Report Status PENDING   HIV antibody     Status: None   Collection Time: 03/26/15  3:09 PM  Result Value Ref Range   HIV Screen 4th Generation wRfx Non Reactive Non Reactive    Comment: (NOTE) Performed At: [facility] [place], [place] [id number] Lindon Romp MD [id number]   CBC     Status: Abnormal   Collection Time: 03/27/15  5:57 AM  Result Value Ref Range   WBC 11.4 (H) 4.0 - 10.5 K/uL   RBC 4.20 3.87 - 5.11 MIL/uL    Hemoglobin 13.4 12.0 - 15.0 g/dL   HCT 42.0 36.0 - 46.0 %   MCV 100.0 78.0 - 100.0 fL   MCH 31.9 26.0 - 34.0 pg   MCHC 31.9 30.0 - 36.0 g/dL   RDW 13.7 11.5 - 15.5 %   Platelets 265 150 - 400 K/uL  Comprehensive metabolic panel     Status: Abnormal   Collection Time: 03/27/15  5:57 AM  Result Value Ref Range   Sodium 139 135 - 145 mmol/L   Potassium 4.1 3.5 - 5.1 mmol/L   Chloride 102 101 - 111 mmol/L   CO2 27 22 - 32 mmol/L   Glucose, Bld 141 (H) 65 - 99 mg/dL   BUN 20 6 -  20 mg/dL   Creatinine, Ser 0.87 0.44 - 1.00 mg/dL   Calcium 9.1 8.9 - 10.3 mg/dL   Total Protein 7.3 6.5 - 8.1 g/dL   Albumin 3.5 3.5 - 5.0 g/dL   AST 16 15 - 41 U/L   ALT 13 (L) 14 - 54 U/L   Alkaline Phosphatase 76 38 - 126 U/L   Total Bilirubin 0.5 0.3 - 1.2 mg/dL   GFR calc non Af Amer >60 >60 mL/min   GFR calc Af Amer >60 >60 mL/min    Comment: (NOTE) The eGFR has been calculated using the CKD EPI equation. This calculation has not been validated in all clinical situations. eGFR's persistently <60 mL/min signify possible Chronic Kidney Disease.    Anion gap 10 5 - 15  TSH     Status: Abnormal   Collection Time: 03/27/15  5:57 AM  Result Value Ref Range   TSH 5.693 (H) 0.350 - 4.500 uIU/mL  Lipid panel     Status: Abnormal   Collection Time: 03/27/15  5:57 AM  Result Value Ref Range   Cholesterol 131 0 - 200 mg/dL   Triglycerides 71 <150 mg/dL   HDL 32 (L) >40 mg/dL   Total CHOL/HDL Ratio 4.1 RATIO   VLDL 14 0 - 40 mg/dL   LDL Cholesterol 85 0 - 99 mg/dL    Comment:        Total Cholesterol/HDL:CHD Risk Coronary Heart Disease Risk Table                     Men   Women  1/2 Average Risk   3.4   3.3  Average Risk       5.0   4.4  2 X Average Risk   9.6   7.1  3 X Average Risk  23.4   11.0        Use the calculated Patient Ratio above and the CHD Risk Table to determine the patient's CHD Risk.        ATP III CLASSIFICATION (LDL):  <100     mg/dL   Optimal  100-129  mg/dL   Near or  Above                    Optimal  130-159  mg/dL   Borderline  160-189  mg/dL   High  >190     mg/dL   Very High   Magnesium     Status: None   Collection Time: 03/27/15  5:57 AM  Result Value Ref Range   Magnesium 2.2 1.7 - 2.4 mg/dL    ABGS  Recent Labs  03/25/15 2238  PHART 7.424  PO2ART 60.1*  HCO3 27.4*   CULTURES Recent Results (from the past 240 hour(s))  Culture, blood (routine x 2) Call MD if unable to obtain prior to antibiotics being given     Status: None (Preliminary result)   Collection Time: 03/26/15  3:02 PM  Result Value Ref Range Status   Specimen Description BLOOD LEFT HAND  Final   Special Requests BOTTLES DRAWN AEROBIC AND ANAEROBIC [facility] EACH  Final   Culture PENDING  Incomplete   Report Status PENDING  Incomplete  Culture, blood (routine x 2) Call MD if unable to obtain prior to antibiotics being given     Status: None (Preliminary result)   Collection Time: 03/26/15  3:09 PM  Result Value Ref Range Status   Specimen Description BLOOD RIGHT HAND  Final  Special Requests BOTTLES DRAWN AEROBIC AND ANAEROBIC [place]  Final   Culture PENDING  Incomplete   Report Status PENDING  Incomplete   Studies/Results: Dg Chest 2 View  03/25/2015  CLINICAL DATA:  Shortness of breath for 1 week. Chest discomfort and palpitations. EXAM: CHEST  2 VIEW COMPARISON:  09/26/2012 FINDINGS: Cardiac enlargement with mild pulmonary vascular congestion. Increased opacity over the lung bases likely representing combination of chronic fibrosis and soft tissue attenuation. No definite consolidation or edema. Emphysematous changes in the lungs. No blunting of costophrenic angles. No pneumothorax. IMPRESSION: Emphysematous changes and interstitial changes in the lungs. Cardiac enlargement with mild pulmonary vascular congestion. Electronically Signed   By: Lucienne Capers M.D.   On: 03/25/2015 20:29   Ct Angio Chest Pe W/cm &/or Wo Cm  03/26/2015  CLINICAL DATA:  Shortness of  breath, COPD, asthma EXAM: CT ANGIOGRAPHY CHEST WITH CONTRAST TECHNIQUE: Multidetector CT imaging of the chest was performed using the standard protocol during bolus administration of intravenous contrast. Multiplanar CT image reconstructions and MIPs were obtained to evaluate the vascular anatomy. CONTRAST:  127m OMNIPAQUE IOHEXOL 350 MG/ML SOLN COMPARISON:  CT of the chest 09/26/2012 and x-ray 03/25/2015 FINDINGS: Mediastinum/Lymph Nodes: Small to moderate anterior pericardial effusion is stable. Cardiomediastinal silhouette is stable. There is no mediastinal adenopathy. Borderline enlarged right infrahilar lymph node axial image 59 is stable from prior exam measures 1.3 cm in diameter without change from prior exam. This is probable reactive. No pulmonary embolus is noted. Left hilar lymph node measures 1.1 short-axis borderline enlarged by size criteria. Central airways are patent. No mediastinal hematoma. Mild atherosclerotic calcifications of thoracic aorta. Lungs/Pleura: Mild atelectasis noted in right middle lobe and lingula. Small peripheral tree-in-bud infiltrates are noted bilateral lower lobe highly suspicious for atypical infection. Please see axial images 81 and 79 on lung window images. There is additional peripheral consolidation with some air bronchogram in left lower lobe posterior medially suspicious for focal pneumonia. There is no pleural effusion. No pleural thickening. Mild bronchitic changes are noted bilateral lower lobe. Bilateral emphysematous changes again noted. Upper abdomen: The visualized liver is unremarkable. Visualized pancreas and spleen are unremarkable. No adrenal gland mass is noted. Visualized upper kidneys are unremarkable. Musculoskeletal: Sagittal images of the spine shows mild degenerative changes thoracic spine. Sagittal view of the sternum is unremarkable. No destructive rib lesions are noted. Review of the MIP images confirms the above findings. IMPRESSION: 1. No  pulmonary embolus is noted. 2. Again noted bilateral borderline hilar adenopathy probable reactive. 3. Patchy peripheral tree-in-bud infiltrates are noted bilateral lower lobe. Findings are highly suspicious for atypical infection. Clinical correlation is necessary. There is additional consolidation with some air bronchogram in left lower lobe posterior medially highly suspicious for focal pneumonia. Mild bronchitic changes are noted bilateral lower lobe. 4. Mild atelectasis noted in right middle lobe and lingula posteriorly. 5. Stable anterior pericardial effusion.  No cardiomegaly. 6. Mild atherosclerotic calcifications of thoracic aorta. Electronically Signed   By: LLahoma CrockerM.D.   On: 03/26/2015 14:08    Medications:  Prior to Admission:  Prescriptions prior to admission  Medication Sig Dispense Refill Last Dose  . albuterol (PROAIR HFA) 108 (90 BASE) MCG/ACT inhaler Inhale 2 puffs into the lungs every 6 (six) hours as needed for wheezing.   unknown  . albuterol-ipratropium (COMBIVENT) 18-103 MCG/ACT inhaler Inhale into the lungs every 4 (four) hours.   03/25/2015 at Unknown time  . ALPRAZolam (XANAX) 1 MG tablet Take 1 mg by mouth  2 (two) times daily.   03/25/2015 at Unknown time  . atorvastatin (LIPITOR) 10 MG tablet Take 10 mg by mouth daily.   03/24/2015 at Unknown time  . citalopram (CELEXA) 20 MG tablet Take 20 mg by mouth daily.   03/25/2015 at Unknown time  . Fluticasone-Salmeterol (ADVAIR) 250-50 MCG/DOSE AEPB Inhale 1 puff into the lungs 2 (two) times daily.   03/25/2015 at Unknown time  . levothyroxine (SYNTHROID, LEVOTHROID) 175 MCG tablet TAKE 1 TABLET BY MOUTH EVERY DAY BEFORE BREAKFAST 30 tablet 3 03/25/2015 at Unknown time  . loratadine (CLARITIN) 10 MG tablet Take 10 mg by mouth daily.   03/25/2015 at Unknown time  . oxyCODONE-acetaminophen (PERCOCET) 10-325 MG per tablet Take 1 tablet by mouth every 6 (six) hours as needed for pain.   Past Week at Unknown time  . OXYGEN Inhale 2.5 L  into the lungs at bedtime.   03/24/2015 at Unknown time  . tiotropium (SPIRIVA) 18 MCG inhalation capsule Place 18 mcg into inhaler and inhale daily.   03/25/2015 at Unknown time  . tiZANidine (ZANAFLEX) 4 MG tablet Take 4 mg by mouth 3 (three) times daily as needed for muscle spasms.   03/24/2015 at Unknown time  . topiramate (TOPAMAX) 25 MG tablet Take 25 mg by mouth 2 (two) times daily.   03/25/2015 at Unknown time  . traZODone (DESYREL) 100 MG tablet Take 300 mg by mouth at bedtime.    unknown  . Vitamin D, Ergocalciferol, (DRISDOL) 50000 units CAPS capsule Take 1 capsule (50,000 Units total) by mouth once a week. 12 capsule 0 03/13/2015 at Unknown time   Scheduled: . ALPRAZolam  1 mg Oral BID  . antiseptic oral rinse  7 mL Mouth Rinse BID  . arformoterol  15 mcg Nebulization BID  . atorvastatin  10 mg Oral Daily  . azithromycin  500 mg Intravenous Q24H  . budesonide (PULMICORT) nebulizer solution  0.25 mg Nebulization BID  . cefTRIAXone (ROCEPHIN)  IV  1 g Intravenous Q24H  . citalopram  20 mg Oral Daily  . fluticasone  2 spray Each Nare Daily  . guaiFENesin  1,200 mg Oral BID  . ipratropium-albuterol  3 mL Nebulization Q6H  . levothyroxine  175 mcg Oral QAC breakfast  . loratadine  10 mg Oral Daily  . methylPREDNISolone (SOLU-MEDROL) injection  80 mg Intravenous Q6H  . sodium chloride flush  3 mL Intravenous Q12H  . sodium chloride flush  3 mL Intravenous Q12H  . topiramate  25 mg Oral BID   Continuous:  FTD:DUKGUR chloride, acetaminophen, ibuprofen, ipratropium-albuterol, oxyCODONE-acetaminophen, sodium chloride flush, tiZANidine  Assesment: She has acute on chronic respiratory failure with hypoxia. She has COPD exacerbation. She is slowly improving. Her oxygenation is better. She is moving air better Principal Problem:   Acute on chronic respiratory failure (HCC) Active Problems:   Hyperlipidemia   Essential hypertension, benign   Sleep apnea   Anxiety   Nocturnal hypoxia    Acute respiratory failure with hypoxia (HCC)   COPD exacerbation (HCC)   COPD (chronic obstructive pulmonary disease) (HCC)   PNA (pneumonia)   Pericardial effusion: Per 2 d echo 03/26/2015    Plan: Continue current treatment with steroids antibiotics etc.    LOS: 2 days   Jnya Brossard L 03/27/2015, 11:31 AM

## 2015-03-28 DIAGNOSIS — R079 Chest pain, unspecified: Secondary | ICD-10-CM

## 2015-03-28 DIAGNOSIS — J441 Chronic obstructive pulmonary disease with (acute) exacerbation: Secondary | ICD-10-CM

## 2015-03-28 DIAGNOSIS — I10 Essential (primary) hypertension: Secondary | ICD-10-CM

## 2015-03-28 DIAGNOSIS — G4730 Sleep apnea, unspecified: Secondary | ICD-10-CM

## 2015-03-28 DIAGNOSIS — J9620 Acute and chronic respiratory failure, unspecified whether with hypoxia or hypercapnia: Secondary | ICD-10-CM

## 2015-03-28 DIAGNOSIS — E785 Hyperlipidemia, unspecified: Secondary | ICD-10-CM

## 2015-03-28 DIAGNOSIS — I319 Disease of pericardium, unspecified: Secondary | ICD-10-CM

## 2015-03-28 DIAGNOSIS — J438 Other emphysema: Secondary | ICD-10-CM

## 2015-03-28 LAB — CBC
HCT: 40.8 % (ref 36.0–46.0)
Hemoglobin: 12.9 g/dL (ref 12.0–15.0)
MCH: 32.1 pg (ref 26.0–34.0)
MCHC: 31.6 g/dL (ref 30.0–36.0)
MCV: 101.5 fL — ABNORMAL HIGH (ref 78.0–100.0)
PLATELETS: 270 10*3/uL (ref 150–400)
RBC: 4.02 MIL/uL (ref 3.87–5.11)
RDW: 13.5 % (ref 11.5–15.5)
WBC: 9.8 10*3/uL (ref 4.0–10.5)

## 2015-03-28 LAB — BASIC METABOLIC PANEL
ANION GAP: 10 (ref 5–15)
BUN: 26 mg/dL — AB (ref 6–20)
CO2: 30 mmol/L (ref 22–32)
Calcium: 9 mg/dL (ref 8.9–10.3)
Chloride: 101 mmol/L (ref 101–111)
Creatinine, Ser: 0.88 mg/dL (ref 0.44–1.00)
GFR calc Af Amer: >60 mL/min (ref >60)
Glucose, Bld: 138 mg/dL — ABNORMAL HIGH (ref 65–99)
POTASSIUM: 4.3 mmol/L (ref 3.5–5.1)
SODIUM: 141 mmol/L (ref 135–145)

## 2015-03-28 MED ORDER — DIPHENHYDRAMINE HCL 25 MG PO CAPS
25.0000 mg | ORAL_CAPSULE | Freq: Three times a day (TID) | ORAL | Status: DC | PRN
  Administered 2015-03-28: 25 mg via ORAL
  Filled 2015-03-28: qty 1

## 2015-03-28 NOTE — Progress Notes (Signed)
Subjective: She says she feels better but is still wheezing. She has no other new complaints. She feels like the Mucinex is making her worse.  Objective: Vital signs in last 24 hours: Temp:  [97.5 F (36.4 C)-97.8 F (36.6 C)] 97.8 F (36.6 C) (03/27 0554) Pulse Rate:  [67-86] 67 (03/27 0554) Resp:  [20] 20 (03/27 0554) BP: (117-127)/(57-65) 126/57 mmHg (03/27 0554) SpO2:  [93 %-98 %] 96 % (03/27 0554) Weight:  [114.76 kg (253 lb)] 114.76 kg (253 lb) (03/27 0554) Weight change: 1.134 kg (2 lb 8 oz) Last BM Date: 03/26/15  Intake/Output from previous day:    PHYSICAL EXAM General appearance: alert, cooperative and no distress Resp: wheezes bilaterally Cardio: regular rate and rhythm, S1, S2 normal, no murmur, click, rub or gallop GI: soft, non-tender; bowel sounds normal; no masses,  no organomegaly Extremities: extremities normal, atraumatic, no cyanosis or edema  Lab Results:  Results for orders placed or performed during the hospital encounter of 03/25/15 (from the past 48 hour(s))  Troponin I     Status: None   Collection Time: 03/26/15 10:06 AM  Result Value Ref Range   Troponin I <0.03 <0.031 ng/mL    Comment:        NO INDICATION OF MYOCARDIAL INJURY.   Basic metabolic panel     Status: Abnormal   Collection Time: 03/26/15 10:06 AM  Result Value Ref Range   Sodium 138 135 - 145 mmol/L   Potassium 3.6 3.5 - 5.1 mmol/L   Chloride 100 (L) 101 - 111 mmol/L   CO2 28 22 - 32 mmol/L   Glucose, Bld 173 (H) 65 - 99 mg/dL   BUN 14 6 - 20 mg/dL   Creatinine, Ser 1.07 (H) 0.44 - 1.00 mg/dL   Calcium 9.1 8.9 - 10.3 mg/dL   GFR calc non Af Amer 57 (L) >60 mL/min   GFR calc Af Amer >60 >60 mL/min    Comment: (NOTE) The eGFR has been calculated using the CKD EPI equation. This calculation has not been validated in all clinical situations. eGFR's persistently <60 mL/min signify possible Chronic Kidney Disease.    Anion gap 10 5 - 15  CBC     Status: None   Collection  Time: 03/26/15 10:06 AM  Result Value Ref Range   WBC 6.3 4.0 - 10.5 K/uL   RBC 4.44 3.87 - 5.11 MIL/uL   Hemoglobin 14.3 12.0 - 15.0 g/dL   HCT 44.2 36.0 - 46.0 %   MCV 99.5 78.0 - 100.0 fL   MCH 32.2 26.0 - 34.0 pg   MCHC 32.4 30.0 - 36.0 g/dL   RDW 13.5 11.5 - 15.5 %   Platelets 216 150 - 400 K/uL  Magnesium     Status: None   Collection Time: 03/26/15 10:06 AM  Result Value Ref Range   Magnesium 2.3 1.7 - 2.4 mg/dL  Culture, blood (routine x 2) Call MD if unable to obtain prior to antibiotics being given     Status: None (Preliminary result)   Collection Time: 03/26/15  3:02 PM  Result Value Ref Range   Specimen Description BLOOD LEFT HAND    Special Requests BOTTLES DRAWN AEROBIC AND ANAEROBIC [facility] EACH    Culture PENDING    Report Status PENDING   Culture, blood (routine x 2) Call MD if unable to obtain prior to antibiotics being given     Status: None (Preliminary result)   Collection Time: 03/26/15  3:09 PM  Result Value Ref  Range   Specimen Description BLOOD RIGHT HAND    Special Requests BOTTLES DRAWN AEROBIC AND ANAEROBIC [facility] EACH    Culture PENDING    Report Status PENDING   HIV antibody     Status: None   Collection Time: 03/26/15  3:09 PM  Result Value Ref Range   HIV Screen 4th Generation wRfx Non Reactive Non Reactive    Comment: (NOTE) Performed At: [facility] [place], [place] [id number] Lindon Romp MD [id number]   CBC     Status: Abnormal   Collection Time: 03/27/15  5:57 AM  Result Value Ref Range   WBC 11.4 (H) 4.0 - 10.5 K/uL   RBC 4.20 3.87 - 5.11 MIL/uL   Hemoglobin 13.4 12.0 - 15.0 g/dL   HCT 42.0 36.0 - 46.0 %   MCV 100.0 78.0 - 100.0 fL   MCH 31.9 26.0 - 34.0 pg   MCHC 31.9 30.0 - 36.0 g/dL   RDW 13.7 11.5 - 15.5 %   Platelets 265 150 - 400 K/uL  Comprehensive metabolic panel     Status: Abnormal   Collection Time: 03/27/15  5:57 AM  Result Value Ref Range   Sodium 139 135 - 145 mmol/L   Potassium 4.1 3.5  - 5.1 mmol/L   Chloride 102 101 - 111 mmol/L   CO2 27 22 - 32 mmol/L   Glucose, Bld 141 (H) 65 - 99 mg/dL   BUN 20 6 - 20 mg/dL   Creatinine, Ser 0.87 0.44 - 1.00 mg/dL   Calcium 9.1 8.9 - 10.3 mg/dL   Total Protein 7.3 6.5 - 8.1 g/dL   Albumin 3.5 3.5 - 5.0 g/dL   AST 16 15 - 41 U/L   ALT 13 (L) 14 - 54 U/L   Alkaline Phosphatase 76 38 - 126 U/L   Total Bilirubin 0.5 0.3 - 1.2 mg/dL   GFR calc non Af Amer >60 >60 mL/min   GFR calc Af Amer >60 >60 mL/min    Comment: (NOTE) The eGFR has been calculated using the CKD EPI equation. This calculation has not been validated in all clinical situations. eGFR's persistently <60 mL/min signify possible Chronic Kidney Disease.    Anion gap 10 5 - 15  TSH     Status: Abnormal   Collection Time: 03/27/15  5:57 AM  Result Value Ref Range   TSH 5.693 (H) 0.350 - 4.500 uIU/mL  Lipid panel     Status: Abnormal   Collection Time: 03/27/15  5:57 AM  Result Value Ref Range   Cholesterol 131 0 - 200 mg/dL   Triglycerides 71 <150 mg/dL   HDL 32 (L) >40 mg/dL   Total CHOL/HDL Ratio 4.1 RATIO   VLDL 14 0 - 40 mg/dL   LDL Cholesterol 85 0 - 99 mg/dL    Comment:        Total Cholesterol/HDL:CHD Risk Coronary Heart Disease Risk Table                     Men   Women  1/2 Average Risk   3.4   3.3  Average Risk       5.0   4.4  2 X Average Risk   9.6   7.1  3 X Average Risk  23.4   11.0        Use the calculated Patient Ratio above and the CHD Risk Table to determine the patient's CHD Risk.  ATP III CLASSIFICATION (LDL):  <100     mg/dL   Optimal  100-129  mg/dL   Near or Above                    Optimal  130-159  mg/dL   Borderline  160-189  mg/dL   High  >190     mg/dL   Very High   Magnesium     Status: None   Collection Time: 03/27/15  5:57 AM  Result Value Ref Range   Magnesium 2.2 1.7 - 2.4 mg/dL  Strep pneumoniae urinary antigen     Status: None   Collection Time: 03/27/15  1:00 PM  Result Value Ref Range   Strep Pneumo  Urinary Antigen NEGATIVE NEGATIVE    Comment:        Infection due to S. pneumoniae cannot be absolutely ruled out since the antigen present may be below the detection limit of the test. Performed at [facility]   CBC     Status: Abnormal   Collection Time: 03/28/15  5:13 AM  Result Value Ref Range   WBC 9.8 4.0 - 10.5 K/uL   RBC 4.02 3.87 - 5.11 MIL/uL   Hemoglobin 12.9 12.0 - 15.0 g/dL   HCT 40.8 36.0 - 46.0 %   MCV 101.5 (H) 78.0 - 100.0 fL   MCH 32.1 26.0 - 34.0 pg   MCHC 31.6 30.0 - 36.0 g/dL   RDW 13.5 11.5 - 15.5 %   Platelets 270 150 - 400 K/uL  Basic metabolic panel     Status: Abnormal   Collection Time: 03/28/15  5:13 AM  Result Value Ref Range   Sodium 141 135 - 145 mmol/L   Potassium 4.3 3.5 - 5.1 mmol/L   Chloride 101 101 - 111 mmol/L   CO2 30 22 - 32 mmol/L   Glucose, Bld 138 (H) 65 - 99 mg/dL   BUN 26 (H) 6 - 20 mg/dL   Creatinine, Ser 0.88 0.44 - 1.00 mg/dL   Calcium 9.0 8.9 - 10.3 mg/dL   GFR calc non Af Amer >60 >60 mL/min   GFR calc Af Amer >60 >60 mL/min    Comment: (NOTE) The eGFR has been calculated using the CKD EPI equation. This calculation has not been validated in all clinical situations. eGFR's persistently <60 mL/min signify possible Chronic Kidney Disease.    Anion gap 10 5 - 15    ABGS  Recent Labs  03/25/15 2238  PHART 7.424  PO2ART 60.1*  HCO3 27.4*   CULTURES Recent Results (from the past 240 hour(s))  Culture, blood (routine x 2) Call MD if unable to obtain prior to antibiotics being given     Status: None (Preliminary result)   Collection Time: 03/26/15  3:02 PM  Result Value Ref Range Status   Specimen Description BLOOD LEFT HAND  Final   Special Requests BOTTLES DRAWN AEROBIC AND ANAEROBIC [facility] EACH  Final   Culture PENDING  Incomplete   Report Status PENDING  Incomplete  Culture, blood (routine x 2) Call MD if unable to obtain prior to antibiotics being given     Status: None (Preliminary result)   Collection  Time: 03/26/15  3:09 PM  Result Value Ref Range Status   Specimen Description BLOOD RIGHT HAND  Final   Special Requests BOTTLES DRAWN AEROBIC AND ANAEROBIC [facility] EACH  Final   Culture PENDING  Incomplete   Report Status PENDING  Incomplete   Studies/Results: Ct  Angio Chest Pe W/cm &/or Wo Cm  03/26/2015  CLINICAL DATA:  Shortness of breath, COPD, asthma EXAM: CT ANGIOGRAPHY CHEST WITH CONTRAST TECHNIQUE: Multidetector CT imaging of the chest was performed using the standard protocol during bolus administration of intravenous contrast. Multiplanar CT image reconstructions and MIPs were obtained to evaluate the vascular anatomy. CONTRAST:  118m OMNIPAQUE IOHEXOL 350 MG/ML SOLN COMPARISON:  CT of the chest 09/26/2012 and x-ray 03/25/2015 FINDINGS: Mediastinum/Lymph Nodes: Small to moderate anterior pericardial effusion is stable. Cardiomediastinal silhouette is stable. There is no mediastinal adenopathy. Borderline enlarged right infrahilar lymph node axial image 59 is stable from prior exam measures 1.3 cm in diameter without change from prior exam. This is probable reactive. No pulmonary embolus is noted. Left hilar lymph node measures 1.1 short-axis borderline enlarged by size criteria. Central airways are patent. No mediastinal hematoma. Mild atherosclerotic calcifications of thoracic aorta. Lungs/Pleura: Mild atelectasis noted in right middle lobe and lingula. Small peripheral tree-in-bud infiltrates are noted bilateral lower lobe highly suspicious for atypical infection. Please see axial images 81 and 79 on lung window images. There is additional peripheral consolidation with some air bronchogram in left lower lobe posterior medially suspicious for focal pneumonia. There is no pleural effusion. No pleural thickening. Mild bronchitic changes are noted bilateral lower lobe. Bilateral emphysematous changes again noted. Upper abdomen: The visualized liver is unremarkable. Visualized pancreas and spleen are  unremarkable. No adrenal gland mass is noted. Visualized upper kidneys are unremarkable. Musculoskeletal: Sagittal images of the spine shows mild degenerative changes thoracic spine. Sagittal view of the sternum is unremarkable. No destructive rib lesions are noted. Review of the MIP images confirms the above findings. IMPRESSION: 1. No pulmonary embolus is noted. 2. Again noted bilateral borderline hilar adenopathy probable reactive. 3. Patchy peripheral tree-in-bud infiltrates are noted bilateral lower lobe. Findings are highly suspicious for atypical infection. Clinical correlation is necessary. There is additional consolidation with some air bronchogram in left lower lobe posterior medially highly suspicious for focal pneumonia. Mild bronchitic changes are noted bilateral lower lobe. 4. Mild atelectasis noted in right middle lobe and lingula posteriorly. 5. Stable anterior pericardial effusion.  No cardiomegaly. 6. Mild atherosclerotic calcifications of thoracic aorta. Electronically Signed   By: LLahoma CrockerM.D.   On: 03/26/2015 14:08    Medications:  Prior to Admission:  Prescriptions prior to admission  Medication Sig Dispense Refill Last Dose  . albuterol (PROAIR HFA) 108 (90 BASE) MCG/ACT inhaler Inhale 2 puffs into the lungs every 6 (six) hours as needed for wheezing.   unknown  . albuterol-ipratropium (COMBIVENT) 18-103 MCG/ACT inhaler Inhale into the lungs every 4 (four) hours.   03/25/2015 at Unknown time  . ALPRAZolam (XANAX) 1 MG tablet Take 1 mg by mouth 2 (two) times daily.   03/25/2015 at Unknown time  . atorvastatin (LIPITOR) 10 MG tablet Take 10 mg by mouth daily.   03/24/2015 at Unknown time  . citalopram (CELEXA) 20 MG tablet Take 20 mg by mouth daily.   03/25/2015 at Unknown time  . Fluticasone-Salmeterol (ADVAIR) 250-50 MCG/DOSE AEPB Inhale 1 puff into the lungs 2 (two) times daily.   03/25/2015 at Unknown time  . levothyroxine (SYNTHROID, LEVOTHROID) 175 MCG tablet TAKE 1 TABLET BY  MOUTH EVERY DAY BEFORE BREAKFAST 30 tablet 3 03/25/2015 at Unknown time  . loratadine (CLARITIN) 10 MG tablet Take 10 mg by mouth daily.   03/25/2015 at Unknown time  . oxyCODONE-acetaminophen (PERCOCET) 10-325 MG per tablet Take 1 tablet by mouth every 6 (  six) hours as needed for pain.   Past Week at Unknown time  . OXYGEN Inhale 2.5 L into the lungs at bedtime.   03/24/2015 at Unknown time  . tiotropium (SPIRIVA) 18 MCG inhalation capsule Place 18 mcg into inhaler and inhale daily.   03/25/2015 at Unknown time  . tiZANidine (ZANAFLEX) 4 MG tablet Take 4 mg by mouth 3 (three) times daily as needed for muscle spasms.   03/24/2015 at Unknown time  . topiramate (TOPAMAX) 25 MG tablet Take 25 mg by mouth 2 (two) times daily.   03/25/2015 at Unknown time  . traZODone (DESYREL) 100 MG tablet Take 300 mg by mouth at bedtime.    unknown  . Vitamin D, Ergocalciferol, (DRISDOL) 50000 units CAPS capsule Take 1 capsule (50,000 Units total) by mouth once a week. 12 capsule 0 03/13/2015 at Unknown time   Scheduled: . ALPRAZolam  1 mg Oral BID  . antiseptic oral rinse  7 mL Mouth Rinse BID  . arformoterol  15 mcg Nebulization BID  . atorvastatin  10 mg Oral Daily  . azithromycin  500 mg Intravenous Q24H  . budesonide (PULMICORT) nebulizer solution  0.25 mg Nebulization BID  . cefTRIAXone (ROCEPHIN)  IV  1 g Intravenous Q24H  . citalopram  20 mg Oral Daily  . fluticasone  2 spray Each Nare Daily  . guaiFENesin  1,200 mg Oral BID  . ipratropium-albuterol  3 mL Nebulization Q6H  . levothyroxine  175 mcg Oral QAC breakfast  . loratadine  10 mg Oral Daily  . methylPREDNISolone (SOLU-MEDROL) injection  80 mg Intravenous 3 times per day  . senna-docusate  2 tablet Oral BID  . sodium chloride flush  3 mL Intravenous Q12H  . sodium chloride flush  3 mL Intravenous Q12H  . topiramate  25 mg Oral BID   Continuous:  YQM:GNOIBB chloride, acetaminophen, ibuprofen, ipratropium-albuterol, oxyCODONE-acetaminophen, sodium  chloride flush, tiZANidine  Assesment: She was admitted with acute on chronic respiratory failure and that's improving. She is now on 3 L of oxygen. She still has wheezing but by her history is approaching baseline. She may be having some trouble from Mucinex so I will discontinue that Principal Problem:   Acute on chronic respiratory failure (HCC) Active Problems:   Hyperlipidemia   Essential hypertension, benign   Sleep apnea   Anxiety   Nocturnal hypoxia   Acute respiratory failure with hypoxia (HCC)   COPD exacerbation (HCC)   COPD (chronic obstructive pulmonary disease) (HCC)   PNA (pneumonia)   Pericardial effusion: Per 2 d echo 03/26/2015    Plan: Discontinue Mucinex. Continue treatments. When she is discharged she will need oxygen more than nocturnal    LOS: 3 days   Terri Casey L 03/28/2015, 8:51 AM

## 2015-03-28 NOTE — Consult Note (Signed)
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 The pt says she had "what I thought was the flu" a week ago. She says "my body just shut down". She does complain of Lt sided chest pain, worse when she bends over. She never lays flat in bed, always raided.  She then become gradually more SOB and presented to the ED at [facility] for further evaluation. BNP and Troponin were not elevated. Echo done 3/25 showed a moderate effusion without evidence of tamponade. Interestingly, she also had a moderate pericardia effusion in 2009 when she was evaluated by cardiology for SOB.   PMHx:  Past Medical History  Diagnosis Date  . Asthma   . COPD (chronic obstructive pulmonary disease) ([place])   . Anxiety   . Thyroid disease     Past Surgical History  Procedure Laterality Date  . Abdominal hysterectomy      SOCHx:  reports that she has been smoking.  She does not have any smokeless tobacco history on file. She reports that she does not drink alcohol or use illicit drugs.  FAMHx: History reviewed. No pertinent family history.  ALLERGIES: Allergies  Allergen Reactions  . Estrogens Hives  . Mustard [employer]  . Strawberry Extract Hives    ROS: Review of Systems: General: negative for chills, fever, night sweats or weight changes.  Cardiovascular: negative for chest pain, dyspnea on exertion, edema, orthopnea, palpitations, paroxysmal nocturnal dyspnea or shortness of breath HEENT: negative for any visual disturbances, blindness, glaucoma Dermatological: negative for rash Respiratory: negative for cough, hemoptysis, or wheezing Urologic: negative for hematuria or dysuria Abdominal: negative for  nausea, vomiting, diarrhea, bright red blood per rectum, melena, or hematemesis Neurologic: negative for visual changes, syncope, or dizziness Musculoskeletal: negative for back pain, joint pain, or swelling Psych: cooperative and appropriate All other systems reviewed and are otherwise negative except as noted above.   HOME MEDICATIONS: Prior to Admission medications   Medication Sig Start Date End Date Taking? Authorizing Provider  albuterol (PROAIR HFA) 108 (90 BASE) MCG/ACT inhaler Inhale 2 puffs into the lungs every 6 (six) hours as needed for wheezing.   Yes Historical Provider, MD  albuterol-ipratropium (COMBIVENT) 18-103 MCG/ACT inhaler Inhale into the lungs every 4 (four) hours.   Yes Historical Provider, MD  ALPRAZolam Duanne Moron) 1 MG tablet Take 1 mg by mouth 2 (two) times daily.   Yes Historical Provider, MD  atorvastatin (LIPITOR) 10 MG tablet Take 10 mg by mouth daily.   Yes Historical Provider, MD  citalopram (CELEXA) 20 MG tablet Take 20 mg by mouth daily.   Yes Historical Provider, MD  Fluticasone-Salmeterol (ADVAIR) 250-50 MCG/DOSE AEPB Inhale 1 puff into the lungs 2 (two) times daily.   Yes Historical Provider, MD  levothyroxine (SYNTHROID, LEVOTHROID) 175 MCG tablet TAKE 1 TABLET BY MOUTH EVERY DAY BEFORE BREAKFAST 03/21/15  Yes Cassandria Anger, MD  loratadine (CLARITIN) 10 MG tablet Take 10 mg by mouth daily.   Yes Historical Provider, MD  oxyCODONE-acetaminophen (PERCOCET) 10-325 MG per tablet Take 1 tablet by mouth every 6 (six) hours as needed for pain.   Yes Historical Provider, MD  OXYGEN Inhale 2.5 L into the lungs at bedtime.  Yes Historical Provider, MD  tiotropium (SPIRIVA) 18 MCG inhalation capsule Place 18 mcg into inhaler and inhale daily.   Yes Historical Provider, MD  tiZANidine (ZANAFLEX) 4 MG tablet Take 4 mg by mouth 3 (three) times daily as needed for muscle spasms.   Yes Historical Provider, MD  topiramate (TOPAMAX) 25 MG tablet Take 25 mg by mouth 2  (two) times daily.   Yes Historical Provider, MD  traZODone (DESYREL) 100 MG tablet Take 300 mg by mouth at bedtime.    Yes Historical Provider, MD  Vitamin D, Ergocalciferol, (DRISDOL) 50000 units CAPS capsule Take 1 capsule (50,000 Units total) by mouth once a week. 03/03/15  Yes Cassandria Anger, MD    HOSPITAL MEDICATIONS: I have reviewed the patient's current medications.  VITALS: Blood pressure 126/57, pulse 67, temperature 97.8 F (36.6 C), temperature source Oral, resp. rate 20, height 5\' 9"  (1.753 m), weight 253 lb (114.76 kg), SpO2 94 %.  PHYSICAL EXAM: General appearance: alert, cooperative, no distress and moderately obese Neck: no carotid bruit and no JVD Lungs: decreased breath sounds c/w emphysema Heart: regular rate and rhythm and decreased heart sounds- no rub heard Abdomen: obese, non tender Extremities: no edema Pulses: 2+ and symmetric Skin: Skin color, texture, turgor normal. No rashes or lesions Neurologic: Grossly normal  LABS: Results for orders placed or performed during the hospital encounter of 03/25/15 (from the past 24 hour(s))  Strep pneumoniae urinary antigen     Status: None   Collection Time: 03/27/15  1:00 PM  Result Value Ref Range   Strep Pneumo Urinary Antigen NEGATIVE NEGATIVE  CBC     Status: Abnormal   Collection Time: 03/28/15  5:13 AM  Result Value Ref Range   WBC 9.8 4.0 - 10.5 K/uL   RBC 4.02 3.87 - 5.11 MIL/uL   Hemoglobin 12.9 12.0 - 15.0 g/dL   HCT 40.8 36.0 - 46.0 %   MCV 101.5 (H) 78.0 - 100.0 fL   MCH 32.1 26.0 - 34.0 pg   MCHC 31.6 30.0 - 36.0 g/dL   RDW 13.5 11.5 - 15.5 %   Platelets 270 150 - 400 K/uL  Basic metabolic panel     Status: Abnormal   Collection Time: 03/28/15  5:13 AM  Result Value Ref Range   Sodium 141 135 - 145 mmol/L   Potassium 4.3 3.5 - 5.1 mmol/L   Chloride 101 101 - 111 mmol/L   CO2 30 22 - 32 mmol/L   Glucose, Bld 138 (H) 65 - 99 mg/dL   BUN 26 (H) 6 - 20 mg/dL   Creatinine, Ser 0.88 0.44 -  1.00 mg/dL   Calcium 9.0 8.9 - 10.3 mg/dL   GFR calc non Af Amer >60 >60 mL/min   GFR calc Af Amer >60 >60 mL/min   Anion gap 10 5 - 15    EKG: NSR, poor ant RW  Echo: 03/25/25 Study Conclusions  - Procedure narrative: Transthoracic echocardiography. Image  quality was poor. - Left ventricle: The cavity size was normal. Mild to moderate  concentric left ventricular hypertrophy. Systolic function was  normal. The estimated ejection fraction was in the range of 60%  to 65%. Images were inadequate for LV wall motion assessment.  Doppler parameters are consistent with abnormal left ventricular  relaxation (grade 1 diastolic dysfunction). - Systemic veins: IVC dilated with normal respiratory variation.  Estimated CVP 8 mmHg. - Pericardium, extracardiac: Moderate size pericardial effusion  noted adjacent to right-sided chambers. There was some degree  of  right atrial impingement noted but no frank collapse. Features  were not consistent with tamponade physiology.   IMAGING: Ct Angio Chest Pe W/cm &/or Wo Cm  03/26/2015  CLINICAL DATA:  Shortness of breath, COPD, asthma EXAM: CT ANGIOGRAPHY CHEST WITH CONTRAST TECHNIQUE: Multidetector CT imaging of the chest was performed using the standard protocol during bolus administration of intravenous contrast. Multiplanar CT image reconstructions and MIPs were obtained to evaluate the vascular anatomy. CONTRAST:  175mL OMNIPAQUE IOHEXOL 350 MG/ML SOLN COMPARISON:  CT of the chest 09/26/2012 and x-ray 03/25/2015 FINDINGS: Mediastinum/Lymph Nodes: Small to moderate anterior pericardial effusion is stable. Cardiomediastinal silhouette is stable. There is no mediastinal adenopathy. Borderline enlarged right infrahilar lymph node axial image 59 is stable from prior exam measures 1.3 cm in diameter without change from prior exam. This is probable reactive. No pulmonary embolus is noted. Left hilar lymph node measures 1.1 short-axis borderline  enlarged by size criteria. Central airways are patent. No mediastinal hematoma. Mild atherosclerotic calcifications of thoracic aorta. Lungs/Pleura: Mild atelectasis noted in right middle lobe and lingula. Small peripheral tree-in-bud infiltrates are noted bilateral lower lobe highly suspicious for atypical infection. Please see axial images 81 and 79 on lung window images. There is additional peripheral consolidation with some air bronchogram in left lower lobe posterior medially suspicious for focal pneumonia. There is no pleural effusion. No pleural thickening. Mild bronchitic changes are noted bilateral lower lobe. Bilateral emphysematous changes again noted. Upper abdomen: The visualized liver is unremarkable. Visualized pancreas and spleen are unremarkable. No adrenal gland mass is noted. Visualized upper kidneys are unremarkable. Musculoskeletal: Sagittal images of the spine shows mild degenerative changes thoracic spine. Sagittal view of the sternum is unremarkable. No destructive rib lesions are noted. Review of the MIP images confirms the above findings. IMPRESSION: 1. No pulmonary embolus is noted. 2. Again noted bilateral borderline hilar adenopathy probable reactive. 3. Patchy peripheral tree-in-bud infiltrates are noted bilateral lower lobe. Findings are highly suspicious for atypical infection. Clinical correlation is necessary. There is additional consolidation with some air bronchogram in left lower lobe posterior medially highly suspicious for focal pneumonia. Mild bronchitic changes are noted bilateral lower lobe. 4. Mild atelectasis noted in right middle lobe and lingula posteriorly. 5. Stable anterior pericardial effusion.  No cardiomegaly. 6. Mild atherosclerotic calcifications of thoracic aorta. Electronically Signed   By: Lahoma Crocker M.D.   On: 03/26/2015 14:08    IMPRESSION: Principal Problem:   Acute on chronic respiratory failure (HCC) Active Problems:   COPD exacerbation (HCC)    Essential hypertension, benign   EMPHYSEMA-on home O2   PNA (pneumonia)   Pericardial effusion: Per 2 d echo 03/26/2015   Hyperlipidemia   Sleep apnea   Anxiety   RECOMMENDATION: MD to review echo- further recommendations pending his review  Time Spent Directly with Patient: 6 minutes  [place], [place] beeper 03/28/2015, 9:39 AM   The patient was seen and examined, and I agree with the assessment and plan as documented above, with modifications as noted below. Pt with aforementioned history admitted with COPD exacerbation. Has been having atypical chest pains at rest exacerbated at times by stress and eating, located in the upper left chest. Scheduled to undergo an outpatient stress test by her PCP, Dr. Wenda Overland. No coronary artery calcifications seen on chest CT. Smokes a pack of cigarettes over 3-4 days. Echo showed normal LV systolic function and moderate size pericardial effusion adjacent to right-sided chambers without tamponade (personally reviewed). ECG showed sinus  tachycardia. Troponins were normal. Chest xray on 3/24 showed some mild pulmonary vascular congestion. She can have a repeat limited echocardiogram in one month to reassess effusion. No further recommendations.  Kate Sable, MD, [facility]  03/28/2015 9:00 AM

## 2015-03-28 NOTE — Progress Notes (Signed)
[facility] PROGRESS NOTE  Terri Casey [id number] DOB: 04/24/1960 DOA: 03/25/2015 PCP: No primary care provider on file.  Assessment/Plan: #1 acute on chronic respiratory failure with hypoxia likely secondary to COPD exacerbation and CAP Patient presenting with acute respiratory failure with hypoxia felt to be secondary to his COPD exacerbation and a community-acquired pneumonia noted per CT chest.  Patient with clinical improvement and now on nasal cannula oxygen.  On 03/26/2015 patient was on 8 L Venturi mask and using accessory muscles of respiration. ABG obtained at a pH of 7.42 PCO2 of 44 PO2 of 60 by cup of 27. Patient also did have complaints of chest pain which she states has been ongoing for several weeks as she was supposed to be set up for stress test per her PCP prior to her presentation.  CT angiogram of chest negative for PE,however consistent with a bibasilar pneumonia.. Continue empiric antibiotics of azithromycin. IV Levaquin has been changed to IV Rocephin. Continue Pulmicort and Brovana. Increased Mucinex to 1200 mg twice daily. Continue IV steroids taper. Continue scheduled nebulizers, Claritin and Flonase.  Pulmonary following and appreciate input and recommendations.  #2 chest pain/ moderate-sized pericardial effusion. 2-D echo 03/26/2015 Patient states she has been having ongoing for several weeks and PCP was getting ready to set up for outpatient stress test. Patient presented with acute respiratory failure noted to be hypoxic. CT angiogram of the chest negative for PE however consistent with bibasilar pneumonia.  2-D echo with a EF of 60-65% with grade 1 diastolic dysfunction. CVP of 8. Moderate sized pericardial effusion noted adjacent to the right-sided chambers. Features not consistent with tamponade physiology. Patient has been seen by cardiology were recommending repeat 2-D echo in 1 month.   #3 hypertension Stable. Follow.  #4 hyperlipidemia  fasting  lipid panel with LDL of 85. Continue home dose Lipitor.  #5 hypothyroidism Repeat TSH 5.693. Resumed synthroid.  #6 sleep apnea  #7 prophylaxis Lovenox for DVT prophylaxis.  Code Status: Full Family Communication: Updated patient and friend. Disposition Plan: Home once acute respiratory failure has improved and patient close to baseline and tolerating oral intake.   Consultants:  Pulmonary: Dr. Luan Pulling 03/26/2015  Cardiology: Dr. Jacinta Shoe 03/28/2015  Procedures:  Chest x-ray 03/25/2015   CT head angiogram chest 03/26/2015   2-D echo 03/26/2015  Antibiotics:  Azithromycin 03/26/2015  IV Levaquin 03/26/2015>>>> 03/26/2015   IV Rocephin 03/26/2015  HPI/Subjective: Patient states shortness of breath and chest pain has improving. Patient states mucinex making her cough worse.  Objective: Filed Vitals:   03/28/15 0554 03/28/15 1457  BP: 126/57 130/74  Pulse: 67 82  Temp: 97.8 F (36.6 C) 98.1 F (36.7 C)  Resp: 20 18    Intake/Output Summary (Last 24 hours) at 03/28/15 1911 Last data filed at 03/28/15 1558  Gross per 24 hour  Intake    480 ml  Output    850 ml  Net   -370 ml   Filed Weights   03/26/15 0658 03/27/15 0622 03/28/15 0554  Weight: 113.4 kg (250 lb) 113.626 kg (250 lb 8 oz) 114.76 kg (253 lb)    Exam:   General:  On [place].  Speaking in full sentences. No use of accessory muscles of respiration.  Cardiovascular: RRR  Respiratory: Poor-fair air movement. Expiratory wheezing,  Less tight  Abdomen: Soft, nontender, nondistended, positive bowel sounds.  Musculoskeletal: No clubbing cyanosis or edema.  Data Reviewed: Basic Metabolic Panel:  Recent Labs Lab 03/25/15 1922 03/26/15 1006 03/27/15 0557 03/28/15 [id number]  NA 136 138 139 141  K 3.3* 3.6 4.1 4.3  CL 100* 100* 102 101  CO2 28 28 27 30   GLUCOSE 115* 173* 141* 138*  BUN 8 14 20  26*  CREATININE 0.85 1.07* 0.87 0.88  CALCIUM 8.7* 9.1 9.1 9.0  MG  --  2.3 2.2  --    Liver  Function Tests:  Recent Labs Lab 03/25/15 1922 03/27/15 0557  AST 13* 16  ALT 9* 13*  ALKPHOS 88 76  BILITOT 0.8 0.5  PROT 7.8 7.3  ALBUMIN 3.8 3.5   No results for input(s): LIPASE, AMYLASE in the last 168 hours. No results for input(s): AMMONIA in the last 168 hours. CBC:  Recent Labs Lab 03/25/15 1922 03/26/15 1006 03/27/15 0557 03/28/15 0513  WBC 9.4 6.3 11.4* 9.8  NEUTROABS 6.4  --   --   --   HGB 14.3 14.3 13.4 12.9  HCT 44.1 44.2 42.0 40.8  MCV 99.8 99.5 100.0 101.5*  PLT 199 216 265 270   Cardiac Enzymes:  Recent Labs Lab 03/25/15 1908 03/26/15 0435 03/26/15 1006  TROPONINI <0.03 <0.03 <0.03   BNP (last 3 results)  Recent Labs  03/25/15 1922  BNP 33.0    ProBNP (last 3 results) No results for input(s): PROBNP in the last 8760 hours.  CBG: No results for input(s): GLUCAP in the last 168 hours.  Recent Results (from the past 240 hour(s))  Culture, blood (routine x 2) Call MD if unable to obtain prior to antibiotics being given     Status: None (Preliminary result)   Collection Time: 03/26/15  3:02 PM  Result Value Ref Range Status   Specimen Description BLOOD LEFT HAND  Final   Special Requests BOTTLES DRAWN AEROBIC AND ANAEROBIC [place]  Final   Culture NO GROWTH 2 DAYS  Final   Report Status PENDING  Incomplete  Culture, blood (routine x 2) Call MD if unable to obtain prior to antibiotics being given     Status: None (Preliminary result)   Collection Time: 03/26/15  3:09 PM  Result Value Ref Range Status   Specimen Description BLOOD RIGHT HAND  Final   Special Requests BOTTLES DRAWN AEROBIC AND ANAEROBIC [place]  Final   Culture NO GROWTH 2 DAYS  Final   Report Status PENDING  Incomplete     Studies: No results found.  Scheduled Meds: . ALPRAZolam  1 mg Oral BID  . antiseptic oral rinse  7 mL Mouth Rinse BID  . arformoterol  15 mcg Nebulization BID  . atorvastatin  10 mg Oral Daily  . azithromycin  500 mg Intravenous Q24H  .  budesonide (PULMICORT) nebulizer solution  0.25 mg Nebulization BID  . cefTRIAXone (ROCEPHIN)  IV  1 g Intravenous Q24H  . citalopram  20 mg Oral Daily  . fluticasone  2 spray Each Nare Daily  . ipratropium-albuterol  3 mL Nebulization Q6H  . levothyroxine  175 mcg Oral QAC breakfast  . loratadine  10 mg Oral Daily  . methylPREDNISolone (SOLU-MEDROL) injection  80 mg Intravenous 3 times per day  . senna-docusate  2 tablet Oral BID  . sodium chloride flush  3 mL Intravenous Q12H  . sodium chloride flush  3 mL Intravenous Q12H  . topiramate  25 mg Oral BID   Continuous Infusions:   Principal Problem:   Acute on chronic respiratory failure (HCC) Active Problems:   COPD exacerbation (HCC)   PNA (pneumonia)   Hyperlipidemia   Essential hypertension, benign  EMPHYSEMA-on home O2   Sleep apnea   Anxiety   Pericardial effusion: Per 2 d echo 03/26/2015    Time spent: 80 minutes    Karin Griffith M.D. [facility] Pager [phone]. If 7PM-7AM, please contact night-coverage at [email or web address], password [facility] 03/28/2015, 7:11 PM  LOS: 3 days

## 2015-03-29 LAB — BASIC METABOLIC PANEL
Anion gap: 9 (ref 5–15)
BUN: 27 mg/dL — ABNORMAL HIGH (ref 6–20)
CALCIUM: 8.9 mg/dL (ref 8.9–10.3)
CO2: 30 mmol/L (ref 22–32)
CREATININE: 0.91 mg/dL (ref 0.44–1.00)
Chloride: 101 mmol/L (ref 101–111)
Glucose, Bld: 142 mg/dL — ABNORMAL HIGH (ref 65–99)
Potassium: 4.4 mmol/L (ref 3.5–5.1)
SODIUM: 140 mmol/L (ref 135–145)

## 2015-03-29 LAB — CBC
HCT: 41.5 % (ref 36.0–46.0)
Hemoglobin: 12.7 g/dL (ref 12.0–15.0)
MCH: 31.3 pg (ref 26.0–34.0)
MCHC: 30.6 g/dL (ref 30.0–36.0)
MCV: 102.2 fL — ABNORMAL HIGH (ref 78.0–100.0)
PLATELETS: 276 10*3/uL (ref 150–400)
RBC: 4.06 MIL/uL (ref 3.87–5.11)
RDW: 13.5 % (ref 11.5–15.5)
WBC: 10.7 10*3/uL — AB (ref 4.0–10.5)

## 2015-03-29 MED ORDER — LEVOFLOXACIN 750 MG PO TABS
750.0000 mg | ORAL_TABLET | Freq: Every day | ORAL | Status: DC
  Administered 2015-03-29: 750 mg via ORAL
  Filled 2015-03-29: qty 1

## 2015-03-29 MED ORDER — IPRATROPIUM-ALBUTEROL 0.5-2.5 (3) MG/3ML IN SOLN
3.0000 mL | Freq: Three times a day (TID) | RESPIRATORY_TRACT | Status: DC
  Administered 2015-03-29: 3 mL via RESPIRATORY_TRACT
  Filled 2015-03-29: qty 3

## 2015-03-29 MED ORDER — PREDNISONE 20 MG PO TABS: 60.0000 mg | ORAL_TABLET | Freq: Two times a day (BID) | ORAL | Status: DC

## 2015-03-29 MED ORDER — SENNOSIDES-DOCUSATE SODIUM 8.6-50 MG PO TABS: 2.0000 | ORAL_TABLET | Freq: Two times a day (BID) | ORAL | Status: DC

## 2015-03-29 MED ORDER — LEVOFLOXACIN 750 MG PO TABS: 750.0000 mg | ORAL_TABLET | Freq: Every day | ORAL | Status: DC

## 2015-03-29 MED ORDER — FLUTICASONE PROPIONATE 50 MCG/ACT NA SUSP: 2.0000 | Freq: Every day | NASAL | Status: DC

## 2015-03-29 MED ORDER — IPRATROPIUM-ALBUTEROL 0.5-2.5 (3) MG/3ML IN SOLN: 3.0000 mL | RESPIRATORY_TRACT | Status: DC | PRN

## 2015-03-29 MED ORDER — TIOTROPIUM BROMIDE MONOHYDRATE 18 MCG IN CAPS: 18.0000 ug | ORAL_CAPSULE | Freq: Every day | RESPIRATORY_TRACT | Status: DC

## 2015-03-29 MED ORDER — PREDNISONE 20 MG PO TABS: 20.0000 mg | ORAL_TABLET | Freq: Two times a day (BID) | ORAL | Status: DC

## 2015-03-29 NOTE — Progress Notes (Signed)
Subjective: She says she feels better and is back at her baseline. She has been able to have her oxygen tapered but she will need oxygen at home not only at night but during the day as well at least temporarily. She's not coughing as much. She's not as short of breath  Objective: Vital signs in last 24 hours: Temp:  [97.6 F (36.4 C)-98.3 F (36.8 C)] 97.6 F (36.4 C) (03/28 0506) Pulse Rate:  [55-82] 55 (03/28 0506) Resp:  [18-20] 20 (03/28 0506) BP: (121-130)/(69-74) 121/69 mmHg (03/28 0506) SpO2:  [86 %-99 %] 97 % (03/28 0744) Weight:  [115.35 kg (254 lb 4.8 oz)] 115.35 kg (254 lb 4.8 oz) (03/28 0506) Weight change: 0.59 kg (1 lb 4.8 oz) Last BM Date: 03/26/15  Intake/Output from previous day: 03/27 0701 - 03/28 0700 In: 480 [P.O.:480] Out: 1650 [Urine:1650]  PHYSICAL EXAM General appearance: alert, cooperative and no distress Resp: She has some wheezing that she says is chronic Cardio: regular rate and rhythm, S1, S2 normal, no murmur, click, rub or gallop GI: soft, non-tender; bowel sounds normal; no masses,  no organomegaly Extremities: extremities normal, atraumatic, no cyanosis or edema  Lab Results:  Results for orders placed or performed during the hospital encounter of 03/25/15 (from the past 48 hour(s))  Strep pneumoniae urinary antigen     Status: None   Collection Time: 03/27/15  1:00 PM  Result Value Ref Range   Strep Pneumo Urinary Antigen NEGATIVE NEGATIVE    Comment:        Infection due to S. pneumoniae cannot be absolutely ruled out since the antigen present may be below the detection limit of the test. Performed at [facility]   CBC     Status: Abnormal   Collection Time: 03/28/15  5:13 AM  Result Value Ref Range   WBC 9.8 4.0 - 10.5 K/uL   RBC 4.02 3.87 - 5.11 MIL/uL   Hemoglobin 12.9 12.0 - 15.0 g/dL   HCT 40.8 36.0 - 46.0 %   MCV 101.5 (H) 78.0 - 100.0 fL   MCH 32.1 26.0 - 34.0 pg   MCHC 31.6 30.0 - 36.0 g/dL   RDW 13.5 11.5 - 15.5  %   Platelets 270 150 - 400 K/uL  Basic metabolic panel     Status: Abnormal   Collection Time: 03/28/15  5:13 AM  Result Value Ref Range   Sodium 141 135 - 145 mmol/L   Potassium 4.3 3.5 - 5.1 mmol/L   Chloride 101 101 - 111 mmol/L   CO2 30 22 - 32 mmol/L   Glucose, Bld 138 (H) 65 - 99 mg/dL   BUN 26 (H) 6 - 20 mg/dL   Creatinine, Ser 0.88 0.44 - 1.00 mg/dL   Calcium 9.0 8.9 - 10.3 mg/dL   GFR calc non Af Amer >60 >60 mL/min   GFR calc Af Amer >60 >60 mL/min    Comment: (NOTE) The eGFR has been calculated using the CKD EPI equation. This calculation has not been validated in all clinical situations. eGFR's persistently <60 mL/min signify possible Chronic Kidney Disease.    Anion gap 10 5 - 15  Basic metabolic panel     Status: Abnormal   Collection Time: 03/29/15  5:25 AM  Result Value Ref Range   Sodium 140 135 - 145 mmol/L   Potassium 4.4 3.5 - 5.1 mmol/L   Chloride 101 101 - 111 mmol/L   CO2 30 22 - 32 mmol/L   Glucose, Bld  142 (H) 65 - 99 mg/dL   BUN 27 (H) 6 - 20 mg/dL   Creatinine, Ser 0.91 0.44 - 1.00 mg/dL   Calcium 8.9 8.9 - 10.3 mg/dL   GFR calc non Af Amer >60 >60 mL/min   GFR calc Af Amer >60 >60 mL/min    Comment: (NOTE) The eGFR has been calculated using the CKD EPI equation. This calculation has not been validated in all clinical situations. eGFR's persistently <60 mL/min signify possible Chronic Kidney Disease.    Anion gap 9 5 - 15  CBC     Status: Abnormal   Collection Time: 03/29/15  5:25 AM  Result Value Ref Range   WBC 10.7 (H) 4.0 - 10.5 K/uL   RBC 4.06 3.87 - 5.11 MIL/uL   Hemoglobin 12.7 12.0 - 15.0 g/dL   HCT 41.5 36.0 - 46.0 %   MCV 102.2 (H) 78.0 - 100.0 fL   MCH 31.3 26.0 - 34.0 pg   MCHC 30.6 30.0 - 36.0 g/dL   RDW 13.5 11.5 - 15.5 %   Platelets 276 150 - 400 K/uL    ABGS No results for input(s): PHART, PO2ART, TCO2, HCO3 in the last 72 hours.  Invalid input(s): PCO2 CULTURES Recent Results (from the past 240 hour(s))   Culture, blood (routine x 2) Call MD if unable to obtain prior to antibiotics being given     Status: None (Preliminary result)   Collection Time: 03/26/15  3:02 PM  Result Value Ref Range Status   Specimen Description BLOOD LEFT HAND  Final   Special Requests BOTTLES DRAWN AEROBIC AND ANAEROBIC [place]  Final   Culture NO GROWTH 2 DAYS  Final   Report Status PENDING  Incomplete  Culture, blood (routine x 2) Call MD if unable to obtain prior to antibiotics being given     Status: None (Preliminary result)   Collection Time: 03/26/15  3:09 PM  Result Value Ref Range Status   Specimen Description BLOOD RIGHT HAND  Final   Special Requests BOTTLES DRAWN AEROBIC AND ANAEROBIC [place]  Final   Culture NO GROWTH 2 DAYS  Final   Report Status PENDING  Incomplete   Studies/Results: No results found.  Medications:  Prior to Admission:  Prescriptions prior to admission  Medication Sig Dispense Refill Last Dose  . albuterol (PROAIR HFA) 108 (90 BASE) MCG/ACT inhaler Inhale 2 puffs into the lungs every 6 (six) hours as needed for wheezing.   unknown  . albuterol-ipratropium (COMBIVENT) 18-103 MCG/ACT inhaler Inhale into the lungs every 4 (four) hours.   03/25/2015 at Unknown time  . ALPRAZolam (XANAX) 1 MG tablet Take 1 mg by mouth 2 (two) times daily.   03/25/2015 at Unknown time  . atorvastatin (LIPITOR) 10 MG tablet Take 10 mg by mouth daily.   03/24/2015 at Unknown time  . citalopram (CELEXA) 20 MG tablet Take 20 mg by mouth daily.   03/25/2015 at Unknown time  . Fluticasone-Salmeterol (ADVAIR) 250-50 MCG/DOSE AEPB Inhale 1 puff into the lungs 2 (two) times daily.   03/25/2015 at Unknown time  . levothyroxine (SYNTHROID, LEVOTHROID) 175 MCG tablet TAKE 1 TABLET BY MOUTH EVERY DAY BEFORE BREAKFAST 30 tablet 3 03/25/2015 at Unknown time  . loratadine (CLARITIN) 10 MG tablet Take 10 mg by mouth daily.   03/25/2015 at Unknown time  . oxyCODONE-acetaminophen (PERCOCET) 10-325 MG per tablet Take 1  tablet by mouth every 6 (six) hours as needed for pain.   Past Week at Unknown time  .  OXYGEN Inhale 2.5 L into the lungs at bedtime.   03/24/2015 at Unknown time  . tiotropium (SPIRIVA) 18 MCG inhalation capsule Place 18 mcg into inhaler and inhale daily.   03/25/2015 at Unknown time  . tiZANidine (ZANAFLEX) 4 MG tablet Take 4 mg by mouth 3 (three) times daily as needed for muscle spasms.   03/24/2015 at Unknown time  . topiramate (TOPAMAX) 25 MG tablet Take 25 mg by mouth 2 (two) times daily.   03/25/2015 at Unknown time  . traZODone (DESYREL) 100 MG tablet Take 300 mg by mouth at bedtime.    unknown  . Vitamin D, Ergocalciferol, (DRISDOL) 50000 units CAPS capsule Take 1 capsule (50,000 Units total) by mouth once a week. 12 capsule 0 03/13/2015 at Unknown time   Scheduled: . ALPRAZolam  1 mg Oral BID  . antiseptic oral rinse  7 mL Mouth Rinse BID  . arformoterol  15 mcg Nebulization BID  . atorvastatin  10 mg Oral Daily  . azithromycin  500 mg Intravenous Q24H  . budesonide (PULMICORT) nebulizer solution  0.25 mg Nebulization BID  . cefTRIAXone (ROCEPHIN)  IV  1 g Intravenous Q24H  . citalopram  20 mg Oral Daily  . fluticasone  2 spray Each Nare Daily  . ipratropium-albuterol  3 mL Nebulization TID  . levothyroxine  175 mcg Oral QAC breakfast  . loratadine  10 mg Oral Daily  . methylPREDNISolone (SOLU-MEDROL) injection  80 mg Intravenous 3 times per day  . senna-docusate  2 tablet Oral BID  . sodium chloride flush  3 mL Intravenous Q12H  . sodium chloride flush  3 mL Intravenous Q12H  . topiramate  25 mg Oral BID   Continuous:  SNK:NLZJQB chloride, acetaminophen, diphenhydrAMINE, ibuprofen, ipratropium-albuterol, oxyCODONE-acetaminophen, sodium chloride flush, tiZANidine  Assesment: She was admitted with COPD exacerbation and community-acquired pneumonia. She is improving. She says she feels like she is back approximately to baseline. She has acute on chronic hypoxic respiratory failure  and had been using oxygen just at night but now is going to require it during the day as well at least temporarily. She has a history of sleep apnea that she says has resolved with weight loss. Principal Problem:   Acute on chronic respiratory failure (HCC) Active Problems:   Hyperlipidemia   Essential hypertension, benign   EMPHYSEMA-on home O2   Sleep apnea   Anxiety   COPD exacerbation (HCC)   PNA (pneumonia)   Pericardial effusion: Per 2 d echo 03/26/2015    Plan: Since I think she is approaching discharge I will plan to sign off at this point. She will need home oxygen during the day and at night. I will be glad to see her as an outpatient if her primary care physician wants me to do that.  Thanks for allowing me to see her with you    LOS: 4 days   Tejal Monroy L 03/29/2015, 8:34 AM

## 2015-03-29 NOTE — Progress Notes (Signed)
Pt discharged home with all belongings and prescriptions, IV removed and site intact.

## 2015-03-29 NOTE — Discharge Summary (Signed)
Physician Discharge Summary  Terri Casey [id number] DOB: 18-Jan-1960 DOA: 03/25/2015  PCP: No primary care provider on file.  Admit date: 03/25/2015 Discharge date: 03/29/2015  Time spent: 65 minutes  Recommendations for Outpatient Follow-up:  1. Follow-up with PCP in 1-2 weeks. On follow-up patient's pneumonia need to be reassessed. Patient will need a basic metabolic profile done to follow-up on electrolytes and renal function. Patient will need a repeat 2-D echo done to follow-up on pericardial effusion.   Discharge Diagnoses:  Principal Problem:   Acute on chronic respiratory failure (HCC) Active Problems:   COPD exacerbation (HCC)   PNA (pneumonia)   Hyperlipidemia   Essential hypertension, benign   EMPHYSEMA-on home O2   Sleep apnea   Anxiety   Pericardial effusion: Per 2 d echo 03/26/2015   Discharge Condition: Stable and improved  Diet recommendation: Regular  Filed Weights   03/27/15 0622 03/28/15 0554 03/29/15 0506  Weight: 113.626 kg (250 lb 8 oz) 114.76 kg (253 lb) 115.35 kg (254 lb 4.8 oz)    History of present illness:  Per Dr Shanon Brow 55 yo female h/o COPD on 2 liters [place] qhs comes in with several days of worsening sob, cough and wheezing. Pt stated she just got the flu shot last week and thinks this is what made her sick. She denied any sick contacts. Reported fever and chills at home. Denied any le edema or swelling or pain. No weight gain. Just reported feeling awful for over a day. Pt was hypoxic on arrival and was on 4 liters [place]. cxr revealled no infiltrate. Pt referred for admission for copd exacerbation with hypoxia. Flu was pending. Pt reported she feels some better with treatment in the ED.  Of note, ABG stat was ordered over an hour ago in ED, not done yet. Pt sent to floor now on 8 liter VM with sats of 92%. Pt mentating normally at this time.   Hospital Course:  #1 acute on chronic respiratory failure with hypoxia likely secondary  to COPD exacerbation and CAP Patient presented with acute respiratory failure with hypoxia felt to be secondary to his COPD exacerbation and a community-acquired pneumonia noted per CT chest. Patient on admission was initially on 8 L Venturi mask. Patient was also noted to be wheezing with poor air movement. On 03/26/2015 patient was on 8 L [place] mask and using accessory muscles of respiration. ABG obtained at a pH of 7.42 PCO2 of 44 PO2 of 60 by cup of 27. Patient also did have complaints of chest pain which she stated had been ongoing for several weeks as she was supposed to be set up for stress test per her PCP prior to her presentation.  CT angiogram of chest negative for PE,however consistent with a bibasilar pneumonia. Patient was initially placed on empiric IV antibiotics, oxygen, nebulizer treatments, IV steroids, Claritin, Flonase, Pulmicort and Brovana, Mucinex. Alert consultation was obtained who followed the patient throughout the hospitalization. Mucinex was eventually discontinued as patient felt it was making her worse. Patient improved clinically on a daily basis and IV steroids was subsequently transitioned to oral steroids. IV antibiotics was subsequently transitioned to oral antibiotics. By day of discharge patient was satting 97% on 4 L nasal cannula and had improved clinically and felt she was close to baseline. Patient be discharged home on oral prednisone taper, oral antibiotics to complete a course of antibiotic treatment, nebulizer treatments. Patient will follow-up with PCP as outpatient.   #2 chest pain/ moderate-sized pericardial effusion. 2-D  echo 03/26/2015 Patient stated she has been having ongoing for several weeks and PCP was getting ready to set up for outpatient stress test. Patient presented with acute respiratory failure noted to be hypoxic. CT angiogram of the chest negative for PE however consistent with bibasilar pneumonia. 2-D echo with a EF of 60-65% with grade 1  diastolic dysfunction. CVP of 8. Moderate sized pericardial effusion noted adjacent to the right-sided chambers. Features not consistent with tamponade physiology. Patient has been seen by cardiology who recommended repeat 2-D echo in 1 month.   #3 hypertension Stable. Follow.  #4 hyperlipidemia fasting lipid panel with LDL of 85. Continued on home dose Lipitor.  #5 hypothyroidism Repeat TSH 5.693. Resumed home dose synthroid. Outpatient follow-up.  #6 sleep apnea  Procedures:  Chest x-ray 03/25/2015  CT angiogram chest 03/26/2015  2-D echo 03/26/2015  Consultations:  Pulmonary: Dr. Luan Pulling 03/26/2015  Cardiology: Dr. Jacinta Shoe 03/28/2015  Discharge Exam: Filed Vitals:   03/28/15 2147 03/29/15 0506  BP: 128/74 121/69  Pulse: 80 55  Temp: 98.3 F (36.8 C) 97.6 F (36.4 C)  Resp: 20 20    General: NAD Cardiovascular: RRR Respiratory: CTAB  Discharge Instructions   Discharge Instructions    Diet general    Complete by:  As directed      Discharge instructions    Complete by:  As directed   Follow up with PCP in 1-2 weeks. Use oxygen daily.     Increase activity slowly    Complete by:  As directed           Current Discharge Medication List    START taking these medications   Details  fluticasone (FLONASE) 50 MCG/ACT nasal spray Place 2 sprays into both nostrils daily. Qty: 16 g, Refills: 0    ipratropium-albuterol (DUONEB) 0.5-2.5 (3) MG/3ML SOLN Take 3 mLs by nebulization every 2 (two) hours as needed. Use 3 times daily x 4 days, then every 2 hours as needed. Qty: 360 mL, Refills: 3    levofloxacin (LEVAQUIN) 750 MG tablet Take 1 tablet (750 mg total) by mouth daily. Take for 5 days then stop. Qty: 5 tablet, Refills: 0    predniSONE (DELTASONE) 20 MG tablet Take 1-3 tablets (20-60 mg total) by mouth 2 (two) times daily with a meal. Take 3 tablets(60mg ) 2 times daily x 1 day, then 3 tablets (60mg ) daily x 3 days, then 2 tablets (40mg ) daily x 3  days, then 1 tablet (20mg ) daily x 3 days then stop. Qty: 24 tablet, Refills: 0    senna-docusate (SENOKOT-S) 8.6-50 MG tablet Take 2 tablets by mouth 2 (two) times daily.      CONTINUE these medications which have CHANGED   Details  tiotropium (SPIRIVA) 18 MCG inhalation capsule Place 1 capsule (18 mcg total) into inhaler and inhale daily. Qty: 30 capsule, Refills: 0      CONTINUE these medications which have NOT CHANGED   Details  albuterol (PROAIR HFA) 108 (90 BASE) MCG/ACT inhaler Inhale 2 puffs into the lungs every 6 (six) hours as needed for wheezing.    albuterol-ipratropium (COMBIVENT) 18-103 MCG/ACT inhaler Inhale into the lungs every 4 (four) hours.    ALPRAZolam (XANAX) 1 MG tablet Take 1 mg by mouth 2 (two) times daily.    atorvastatin (LIPITOR) 10 MG tablet Take 10 mg by mouth daily.    citalopram (CELEXA) 20 MG tablet Take 20 mg by mouth daily.    Fluticasone-Salmeterol (ADVAIR) 250-50 MCG/DOSE AEPB Inhale 1 puff into the  lungs 2 (two) times daily.    levothyroxine (SYNTHROID, LEVOTHROID) 175 MCG tablet TAKE 1 TABLET BY MOUTH EVERY DAY BEFORE BREAKFAST Qty: 30 tablet, Refills: 3    loratadine (CLARITIN) 10 MG tablet Take 10 mg by mouth daily.    oxyCODONE-acetaminophen (PERCOCET) 10-325 MG per tablet Take 1 tablet by mouth every 6 (six) hours as needed for pain.    OXYGEN Inhale 2.5 L into the lungs at bedtime.    tiZANidine (ZANAFLEX) 4 MG tablet Take 4 mg by mouth 3 (three) times daily as needed for muscle spasms.    topiramate (TOPAMAX) 25 MG tablet Take 25 mg by mouth 2 (two) times daily.    traZODone (DESYREL) 100 MG tablet Take 300 mg by mouth at bedtime.     Vitamin D, Ergocalciferol, (DRISDOL) 50000 units CAPS capsule Take 1 capsule (50,000 Units total) by mouth once a week. Qty: 12 capsule, Refills: 0       Allergies  Allergen Reactions  . Estrogens Hives  . Mustard [employer]  . Strawberry Extract Hives   Follow-up Information    Schedule  an appointment as soon as possible for a visit in 1 week to follow up.   Why:  f/u PCP in 1-2 weeks       The results of significant diagnostics from this hospitalization (including imaging, microbiology, ancillary and laboratory) are listed below for reference.    Significant Diagnostic Studies: Dg Chest 2 View  03/25/2015  CLINICAL DATA:  Shortness of breath for 1 week. Chest discomfort and palpitations. EXAM: CHEST  2 VIEW COMPARISON:  09/26/2012 FINDINGS: Cardiac enlargement with mild pulmonary vascular congestion. Increased opacity over the lung bases likely representing combination of chronic fibrosis and soft tissue attenuation. No definite consolidation or edema. Emphysematous changes in the lungs. No blunting of costophrenic angles. No pneumothorax. IMPRESSION: Emphysematous changes and interstitial changes in the lungs. Cardiac enlargement with mild pulmonary vascular congestion. Electronically Signed   By: Lucienne Capers M.D.   On: 03/25/2015 20:29   Ct Angio Chest Pe W/cm &/or Wo Cm  03/26/2015  CLINICAL DATA:  Shortness of breath, COPD, asthma EXAM: CT ANGIOGRAPHY CHEST WITH CONTRAST TECHNIQUE: Multidetector CT imaging of the chest was performed using the standard protocol during bolus administration of intravenous contrast. Multiplanar CT image reconstructions and MIPs were obtained to evaluate the vascular anatomy. CONTRAST:  129mL OMNIPAQUE IOHEXOL 350 MG/ML SOLN COMPARISON:  CT of the chest 09/26/2012 and x-ray 03/25/2015 FINDINGS: Mediastinum/Lymph Nodes: Small to moderate anterior pericardial effusion is stable. Cardiomediastinal silhouette is stable. There is no mediastinal adenopathy. Borderline enlarged right infrahilar lymph node axial image 59 is stable from prior exam measures 1.3 cm in diameter without change from prior exam. This is probable reactive. No pulmonary embolus is noted. Left hilar lymph node measures 1.1 short-axis borderline enlarged by size criteria. Central  airways are patent. No mediastinal hematoma. Mild atherosclerotic calcifications of thoracic aorta. Lungs/Pleura: Mild atelectasis noted in right middle lobe and lingula. Small peripheral tree-in-bud infiltrates are noted bilateral lower lobe highly suspicious for atypical infection. Please see axial images 81 and 79 on lung window images. There is additional peripheral consolidation with some air bronchogram in left lower lobe posterior medially suspicious for focal pneumonia. There is no pleural effusion. No pleural thickening. Mild bronchitic changes are noted bilateral lower lobe. Bilateral emphysematous changes again noted. Upper abdomen: The visualized liver is unremarkable. Visualized pancreas and spleen are unremarkable. No adrenal gland mass is noted. Visualized upper kidneys are unremarkable.  Musculoskeletal: Sagittal images of the spine shows mild degenerative changes thoracic spine. Sagittal view of the sternum is unremarkable. No destructive rib lesions are noted. Review of the MIP images confirms the above findings. IMPRESSION: 1. No pulmonary embolus is noted. 2. Again noted bilateral borderline hilar adenopathy probable reactive. 3. Patchy peripheral tree-in-bud infiltrates are noted bilateral lower lobe. Findings are highly suspicious for atypical infection. Clinical correlation is necessary. There is additional consolidation with some air bronchogram in left lower lobe posterior medially highly suspicious for focal pneumonia. Mild bronchitic changes are noted bilateral lower lobe. 4. Mild atelectasis noted in right middle lobe and lingula posteriorly. 5. Stable anterior pericardial effusion.  No cardiomegaly. 6. Mild atherosclerotic calcifications of thoracic aorta. Electronically Signed   By: Lahoma Crocker M.D.   On: 03/26/2015 14:08    Microbiology: Recent Results (from the past 240 hour(s))  Culture, blood (routine x 2) Call MD if unable to obtain prior to antibiotics being given     Status:  None (Preliminary result)   Collection Time: 03/26/15  3:02 PM  Result Value Ref Range Status   Specimen Description BLOOD LEFT HAND  Final   Special Requests BOTTLES DRAWN AEROBIC AND ANAEROBIC [place]  Final   Culture NO GROWTH 3 DAYS  Final   Report Status PENDING  Incomplete  Culture, blood (routine x 2) Call MD if unable to obtain prior to antibiotics being given     Status: None (Preliminary result)   Collection Time: 03/26/15  3:09 PM  Result Value Ref Range Status   Specimen Description BLOOD RIGHT HAND  Final   Special Requests BOTTLES DRAWN AEROBIC AND ANAEROBIC [facility] EACH  Final   Culture NO GROWTH 3 DAYS  Final   Report Status PENDING  Incomplete     Labs: Basic Metabolic Panel:  Recent Labs Lab 03/25/15 1922 03/26/15 1006 03/27/15 0557 03/28/15 0513 03/29/15 0525  NA 136 138 139 141 140  K 3.3* 3.6 4.1 4.3 4.4  CL 100* 100* 102 101 101  CO2 28 28 27 30 30   GLUCOSE 115* 173* 141* 138* 142*  BUN 8 14 20  26* 27*  CREATININE 0.85 1.07* 0.87 0.88 0.91  CALCIUM 8.7* 9.1 9.1 9.0 8.9  MG  --  2.3 2.2  --   --    Liver Function Tests:  Recent Labs Lab 03/25/15 1922 03/27/15 0557  AST 13* 16  ALT 9* 13*  ALKPHOS 88 76  BILITOT 0.8 0.5  PROT 7.8 7.3  ALBUMIN 3.8 3.5   No results for input(s): LIPASE, AMYLASE in the last 168 hours. No results for input(s): AMMONIA in the last 168 hours. CBC:  Recent Labs Lab 03/25/15 1922 03/26/15 1006 03/27/15 0557 03/28/15 0513 03/29/15 0525  WBC 9.4 6.3 11.4* 9.8 10.7*  NEUTROABS 6.4  --   --   --   --   HGB 14.3 14.3 13.4 12.9 12.7  HCT 44.1 44.2 42.0 40.8 41.5  MCV 99.8 99.5 100.0 101.5* 102.2*  PLT 199 216 265 270 276   Cardiac Enzymes:  Recent Labs Lab 03/25/15 1908 03/26/15 0435 03/26/15 1006  TROPONINI <0.03 <0.03 <0.03   BNP: BNP (last 3 results)  Recent Labs  03/25/15 1922  BNP 33.0    ProBNP (last 3 results) No results for input(s): PROBNP in the last 8760 hours.  CBG: No results for  input(s): GLUCAP in the last 168 hours.     SignedIrine Seal MD.  [facility] 03/29/2015, 1:04 PM

## 2015-03-29 NOTE — Care Management Note (Signed)
Case Management Note  Patient Details  Name: Terri Casey MRN: [id number] Date of Birth: 11-04-1960  Subjective/Objective:     Spoke with patient who is alert and oriented from home with family. patient stated that she ambulates fine without a cane or walker and that she still drives self. Patient is setup with home O2 from [place] and she stated that she   Has concentrator and also two types of portable tanks. She stated that she is set up for continuous at home. Patient understands that she will need to wear continuous O2 at home  No other CM needs identified.        Action/Plan:Home with self care.   Expected Discharge Date:  03/28/15               Expected Discharge Plan:  Home/Self Care  In-House Referral:     Discharge planning Services  CM Consult  Post Acute Care Choice:    Choice offered to:     DME Arranged:    DME Agency:     HH Arranged:    HH Agency:     Status of Service:  Completed, signed off  Medicare Important Message Given:    Date Medicare IM Given:    Medicare IM give by:    Date Additional Medicare IM Given:    Additional Medicare Important Message give by:     If discussed at [place] of Stay Meetings, dates discussed:    Additional Comments:  Alvie Heidelberg, RN 03/29/2015, 1:39 PM

## 2015-03-30 LAB — LEGIONELLA PNEUMOPHILA SEROGP 1 UR AG: L. PNEUMOPHILA SEROGP 1 UR AG: NEGATIVE

## 2015-03-31 LAB — CULTURE, BLOOD (ROUTINE X 2)
CULTURE: NO GROWTH
CULTURE: NO GROWTH

## 2015-05-23 ENCOUNTER — Other Ambulatory Visit: Payer: Self-pay | Admitting: "Endocrinology

## 2015-06-09 ENCOUNTER — Ambulatory Visit: Payer: Medicaid Other | Admitting: "Endocrinology

## 2015-06-24 ENCOUNTER — Telehealth: Payer: Self-pay

## 2015-06-24 NOTE — Telephone Encounter (Signed)
Thyroid u/s scheduled for 07-01-15 at 12:30 at [facility]. PA pending

## 2015-07-01 ENCOUNTER — Ambulatory Visit (HOSPITAL_COMMUNITY): Payer: Medicaid Other

## 2015-07-01 ENCOUNTER — Other Ambulatory Visit: Payer: Self-pay | Admitting: "Endocrinology

## 2015-07-02 LAB — T4, FREE: Free T4: 1.6 ng/dL (ref 0.8–1.8)

## 2015-07-02 LAB — TSH: TSH: 0.38 m[IU]/L — AB

## 2015-07-06 ENCOUNTER — Encounter: Payer: Self-pay | Admitting: "Endocrinology

## 2015-07-06 ENCOUNTER — Ambulatory Visit (INDEPENDENT_AMBULATORY_CARE_PROVIDER_SITE_OTHER): Payer: Medicaid Other | Admitting: "Endocrinology

## 2015-07-06 VITALS — BP 119/73 | HR 102 | Ht 69.0 in | Wt 263.0 lb

## 2015-07-06 DIAGNOSIS — E039 Hypothyroidism, unspecified: Secondary | ICD-10-CM

## 2015-07-06 DIAGNOSIS — E042 Nontoxic multinodular goiter: Secondary | ICD-10-CM | POA: Diagnosis not present

## 2015-07-06 DIAGNOSIS — E785 Hyperlipidemia, unspecified: Secondary | ICD-10-CM | POA: Diagnosis not present

## 2015-07-06 DIAGNOSIS — I10 Essential (primary) hypertension: Secondary | ICD-10-CM

## 2015-07-06 MED ORDER — VITAMIN D (ERGOCALCIFEROL) 1.25 MG (50000 UNIT) PO CAPS: 50000.0000 [IU] | ORAL_CAPSULE | ORAL | Status: DC

## 2015-07-06 MED ORDER — LEVOTHYROXINE SODIUM 150 MCG PO TABS: 150.0000 ug | ORAL_TABLET | Freq: Every day | ORAL | Status: DC

## 2015-07-06 NOTE — Progress Notes (Signed)
Subjective:    Patient ID: Terri Casey, female    DOB: 1960/04/05, PCP No primary care provider on file.   Past Medical History  Diagnosis Date  . Asthma   . COPD (chronic obstructive pulmonary disease) ([place])   . Anxiety   . Thyroid disease    Past Surgical History  Procedure Laterality Date  . Abdominal hysterectomy     Social History   Social History  . Marital Status: Single    Spouse Name: N/A  . Number of Children: N/A  . Years of Education: N/A   Social History Main Topics  . Smoking status: Current Every Day Smoker -- 0.50 packs/day  . Smokeless tobacco: None  . Alcohol Use: No  . Drug Use: No  . Sexual Activity: Not Asked   Other Topics Concern  . None   Social History Narrative   Outpatient Encounter Prescriptions as of 07/06/2015  Medication Sig  . albuterol (PROAIR HFA) 108 (90 BASE) MCG/ACT inhaler Inhale 2 puffs into the lungs every 6 (six) hours as needed for wheezing.  Marland Kitchen albuterol-ipratropium (COMBIVENT) 18-103 MCG/ACT inhaler Inhale into the lungs every 4 (four) hours.  . ALPRAZolam (XANAX) 1 MG tablet Take 1 mg by mouth 2 (two) times daily.  Marland Kitchen atorvastatin (LIPITOR) 10 MG tablet Take 10 mg by mouth daily.  . citalopram (CELEXA) 20 MG tablet Take 20 mg by mouth daily.  . fluticasone (FLONASE) 50 MCG/ACT nasal spray Place 2 sprays into both nostrils daily.  . Fluticasone-Salmeterol (ADVAIR) 250-50 MCG/DOSE AEPB Inhale 1 puff into the lungs 2 (two) times daily.  Marland Kitchen ipratropium-albuterol (DUONEB) 0.5-2.5 (3) MG/3ML SOLN Take 3 mLs by nebulization every 2 (two) hours as needed. Use 3 times daily x 4 days, then every 2 hours as needed.  Marland Kitchen levothyroxine (SYNTHROID, LEVOTHROID) 150 MCG tablet Take 1 tablet (150 mcg total) by mouth daily before breakfast.  . loratadine (CLARITIN) 10 MG tablet Take 10 mg by mouth daily.  Marland Kitchen oxyCODONE-acetaminophen (PERCOCET) 10-325 MG per tablet Take 1 tablet by mouth every 6 (six) hours as needed for pain.  . OXYGEN  Inhale 2.5 L into the lungs at bedtime.  . senna-docusate (SENOKOT-S) 8.6-50 MG tablet Take 2 tablets by mouth 2 (two) times daily.  Marland Kitchen tiotropium (SPIRIVA) 18 MCG inhalation capsule Place 1 capsule (18 mcg total) into inhaler and inhale daily.  Marland Kitchen tiZANidine (ZANAFLEX) 4 MG tablet Take 4 mg by mouth 3 (three) times daily as needed for muscle spasms.  Marland Kitchen topiramate (TOPAMAX) 25 MG tablet Take 25 mg by mouth 2 (two) times daily.  . Vitamin D, Ergocalciferol, (DRISDOL) 50000 units CAPS capsule Take 1 capsule (50,000 Units total) by mouth once a week.  . [DISCONTINUED] levofloxacin (LEVAQUIN) 750 MG tablet Take 1 tablet (750 mg total) by mouth daily. Take for 5 days then stop.  . [DISCONTINUED] levothyroxine (SYNTHROID, LEVOTHROID) 175 MCG tablet TAKE 1 TABLET BY MOUTH EVERY DAY BEFORE BREAKFAST  . [DISCONTINUED] predniSONE (DELTASONE) 20 MG tablet Take 1-3 tablets (20-60 mg total) by mouth 2 (two) times daily with a meal. Take 3 tablets(60mg ) 2 times daily x 1 day, then 3 tablets (60mg ) daily x 3 days, then 2 tablets (40mg ) daily x 3 days, then 1 tablet (20mg ) daily x 3 days then stop.  . [DISCONTINUED] traZODone (DESYREL) 100 MG tablet Take 300 mg by mouth at bedtime.   . [DISCONTINUED] Vitamin D, Ergocalciferol, (DRISDOL) 50000 units CAPS capsule TAKE 1 CAPSULE BY MOUTH ONCE A WEEK  No facility-administered encounter medications on file as of 07/06/2015.   ALLERGIES: Allergies  Allergen Reactions  . Estrogens Hives  . Mustard [employer]  . Strawberry Extract Hives   VACCINATION STATUS:  There is no immunization history on file for this patient.  HPI 55 yr old female with medical hx of hypothyroidism, HTN, HPL, Osteoporosis, COPD, sleep apnea.  she was previously seen for same hypothyroidism requested by Dr. Wenda Overland. She did not keep her appointments since April 2016.  -she is known to have a habit of disappearing from care. Prior to her last visit she did separate from care for 3 years.  Since  last visit, she did have significant interruptions in her levothyroxine. She was supposed to be on 175 g of levothyroxine by mouth every morning.   -her weight is  Stable at a  level of 264 pounds, she feels fatigued.   she continues to smoke cigs. she has family hx of hyperthyroidism in her sister, and unidentified thyroid dysfunction in her father.    Review of Systems Constitutional: - weight gain, + fatigue, no subjective hyperthermia/hypothermia Eyes: no blurry vision, no xerophthalmia ENT: no sore throat, no nodules palpated in throat, no dysphagia/odynophagia, no hoarseness Cardiovascular: no CP/SOB/palpitations/leg swelling Respiratory: no cough/SOB Gastrointestinal: no N/V/D/C Musculoskeletal: no muscle/joint aches Skin: no rashes Neurological: no tremors/numbness/tingling/dizziness Psychiatric: no depression/anxiety  Objective:    BP 119/73 mmHg  Pulse 102  Ht 5\' 9"  (1.753 m)  Wt 263 lb (119.296 kg)  BMI 38.82 kg/m2  Wt Readings from Last 3 Encounters:  07/06/15 263 lb (119.296 kg)  03/29/15 254 lb 4.8 oz (115.35 kg)  03/03/15 264 lb (119.75 kg)    Physical Exam Constitutional: obese, in NAD Eyes: PERRLA, EOMI, no exophthalmos ENT: moist mucous membranes, no thyromegaly, + cervical lymphadenopathy Cardiovascular: RRR, No MRG Respiratory: CTA B Gastrointestinal: abdomen soft, NT, ND, BS+ Musculoskeletal: no deformities, strength intact in all 4 Skin: moist, warm, no rashes Neurological: no tremor with outstretched hands, DTR normal in all 4  CMP ( most recent) CMP     Component Value Date/Time   NA 140 03/29/2015 0525   K 4.4 03/29/2015 0525   CL 101 03/29/2015 0525   CO2 30 03/29/2015 0525   GLUCOSE 142* 03/29/2015 0525   BUN 27* 03/29/2015 0525   CREATININE 0.91 03/29/2015 0525   CALCIUM 8.9 03/29/2015 0525   PROT 7.3 03/27/2015 0557   ALBUMIN 3.5 03/27/2015 0557   AST 16 03/27/2015 0557   ALT 13* 03/27/2015 0557   ALKPHOS 76 03/27/2015 0557    BILITOT 0.5 03/27/2015 0557   GFRNONAA >60 03/29/2015 0525   GFRAA >60 03/29/2015 0525    Diabetic Labs (most recent): Lab Results  Component Value Date   HGBA1C 5.3 02/21/2015     Results for JOELYS, GLASGOW A (MRN [id number]) as of 07/06/2015 11:27  Ref. Range 07/01/2015 15:48  TSH Latest Units: mIU/L 0.38 (L)  T4,Free(Direct) Latest Ref Range: 0.8-1.8 ng/dL 1.6    Assessment & Plan:   1. Hypothyroidism, unspecified hypothyroidism type - Her thyroid function tests are indicative of over replacement. I will lower her levothyroxine to 150 g by mouth every morning.   - We discussed about correct intake of levothyroxine, at fasting, with water, separated by at least 30 minutes from breakfast, and separated by more than 4 hours from calcium, iron, multivitamins, acid reflux medications (PPIs). -Patient is made aware of the fact that thyroid hormone replacement is needed for life, dose to be adjusted  by periodic monitoring of thyroid function tests.   2. Essential hypertension, benign -Controlled. Continue current medications.  3. Hyperlipidemia -Continue Lipitor 10 mg by mouth daily at bedtime.  4. Vitamin D deficiency -Continue vitamin D 50,000 units weekly for the next 12 weeks.  5) multinodular goiter: - Her last thyroid/neck ultrasound in 2013 showed hypoechoic 1.3 cm thyroid nodule and at 2.1 cm lymph node, which will need follow-up ultrasound to monitor growth. Her insurance declined our last request for repeat ultrasound. However, this is a necessity to make sure she does not have malignancy. I will request for studies again on her behalf.  - I advised patient to maintain close follow up with Dr. Wenda Overland for primary care needs. Follow up plan: Return in about 3 months (around 10/06/2015) for Thyroid Ultrasound, follow up with pre-visit labs.  Glade Lloyd, MD Phone: [phone]  Fax: [phone]   07/06/2015, 11:38 AM

## 2015-07-13 ENCOUNTER — Telehealth: Payer: Self-pay

## 2015-07-13 NOTE — Telephone Encounter (Signed)
Spoke with [place] [employer] concerning starting an appeal for the denial of services to have a thyroid u/s. They state that we cannot start this process. The pt should be getting a Hearing Request Form to fill out and send in. Notified pt that she should be receiving this form from medicaid and to let us know once she does this. Pt agrees.

## 2015-10-12 ENCOUNTER — Ambulatory Visit: Payer: Medicaid Other | Admitting: "Endocrinology

## 2015-10-14 ENCOUNTER — Other Ambulatory Visit: Payer: Self-pay | Admitting: "Endocrinology

## 2015-10-14 ENCOUNTER — Ambulatory Visit: Payer: Medicaid Other | Admitting: "Endocrinology

## 2015-10-15 LAB — T4, FREE: Free T4: 1 ng/dL (ref 0.8–1.8)

## 2015-10-15 LAB — TSH: TSH: 4.13 mIU/L

## 2015-10-20 ENCOUNTER — Ambulatory Visit: Payer: Medicaid Other | Admitting: "Endocrinology

## 2015-10-24 ENCOUNTER — Other Ambulatory Visit: Payer: Self-pay | Admitting: "Endocrinology

## 2015-12-28 ENCOUNTER — Other Ambulatory Visit: Payer: Self-pay | Admitting: "Endocrinology

## 2016-01-10 ENCOUNTER — Other Ambulatory Visit: Payer: Self-pay | Admitting: "Endocrinology

## 2016-01-10 DIAGNOSIS — E042 Nontoxic multinodular goiter: Secondary | ICD-10-CM

## 2016-02-07 ENCOUNTER — Other Ambulatory Visit: Payer: Self-pay | Admitting: "Endocrinology

## 2016-02-07 ENCOUNTER — Ambulatory Visit (HOSPITAL_COMMUNITY): Payer: Medicaid Other

## 2016-02-07 ENCOUNTER — Ambulatory Visit (HOSPITAL_COMMUNITY)
Admission: RE | Admit: 2016-02-07 | Discharge: 2016-02-07 | Disposition: A | Discharge status: Home / Self Care | Payer: Medicaid Other | Source: Ambulatory Visit | Attending: "Endocrinology | Admitting: "Endocrinology

## 2016-02-07 DIAGNOSIS — E042 Nontoxic multinodular goiter: Secondary | ICD-10-CM | POA: Insufficient documentation

## 2016-02-07 LAB — T4, FREE: FREE T4: 1 ng/dL (ref 0.8–1.8)

## 2016-02-07 LAB — TSH: TSH: 18.84 m[IU]/L — AB

## 2016-02-14 ENCOUNTER — Ambulatory Visit (INDEPENDENT_AMBULATORY_CARE_PROVIDER_SITE_OTHER): Payer: Medicaid Other | Admitting: "Endocrinology

## 2016-02-14 ENCOUNTER — Encounter: Payer: Self-pay | Admitting: "Endocrinology

## 2016-02-14 VITALS — BP 116/77 | HR 92 | Ht 69.0 in | Wt 277.0 lb

## 2016-02-14 DIAGNOSIS — E042 Nontoxic multinodular goiter: Secondary | ICD-10-CM

## 2016-02-14 DIAGNOSIS — E559 Vitamin D deficiency, unspecified: Secondary | ICD-10-CM

## 2016-02-14 DIAGNOSIS — E039 Hypothyroidism, unspecified: Secondary | ICD-10-CM | POA: Diagnosis not present

## 2016-02-14 DIAGNOSIS — I10 Essential (primary) hypertension: Secondary | ICD-10-CM | POA: Diagnosis not present

## 2016-02-14 MED ORDER — LEVOTHYROXINE SODIUM 175 MCG PO TABS: 175.0000 ug | ORAL_TABLET | Freq: Every day | ORAL | 6 refills | Status: DC

## 2016-02-14 MED ORDER — VITAMIN D3 125 MCG (5000 UT) PO CAPS: 5000.0000 [IU] | ORAL_CAPSULE | Freq: Every day | ORAL | 0 refills | Status: DC

## 2016-02-14 NOTE — Progress Notes (Signed)
Subjective:    Patient ID: Terri Casey, female    DOB: 01-Jan-1961, PCP Terri Savage, MD   Past Medical History:  Diagnosis Date  . Anxiety   . Asthma   . COPD (chronic obstructive pulmonary disease) ([place])   . Thyroid disease    Past Surgical History:  Procedure Laterality Date  . ABDOMINAL HYSTERECTOMY     Social History   Social History  . Marital status: Single    Spouse name: N/A  . Number of children: N/A  . Years of education: N/A   Social History Main Topics  . Smoking status: Current Every Day Smoker    Packs/day: 0.50  . Smokeless tobacco: Never Used  . Alcohol use No  . Drug use: No  . Sexual activity: Not Asked   Other Topics Concern  . None   Social History Narrative  . None   Outpatient Encounter Prescriptions as of 02/14/2016  Medication Sig  . meclizine (ANTIVERT) 25 MG tablet Take 25 mg by mouth 3 (three) times daily as needed.  Marland Kitchen albuterol (PROAIR HFA) 108 (90 BASE) MCG/ACT inhaler Inhale 2 puffs into the lungs every 6 (six) hours as needed for wheezing.  Marland Kitchen albuterol-ipratropium (COMBIVENT) 18-103 MCG/ACT inhaler Inhale into the lungs every 4 (four) hours.  . ALPRAZolam (XANAX) 1 MG tablet Take 1 mg by mouth 2 (two) times daily.  Marland Kitchen atorvastatin (LIPITOR) 10 MG tablet Take 10 mg by mouth daily.  . Cholecalciferol (VITAMIN D3) 5000 units CAPS Take 1 capsule (5,000 Units total) by mouth daily.  . citalopram (CELEXA) 20 MG tablet Take 20 mg by mouth daily.  . fluticasone (FLONASE) 50 MCG/ACT nasal spray Place 2 sprays into both nostrils daily.  . Fluticasone-Salmeterol (ADVAIR) 250-50 MCG/DOSE AEPB Inhale 1 puff into the lungs 2 (two) times daily.  Marland Kitchen ipratropium-albuterol (DUONEB) 0.5-2.5 (3) MG/3ML SOLN Take 3 mLs by nebulization every 2 (two) hours as needed. Use 3 times daily x 4 days, then every 2 hours as needed.  Marland Kitchen levothyroxine (SYNTHROID, LEVOTHROID) 175 MCG tablet Take 1 tablet (175 mcg total) by mouth daily before breakfast.  .  loratadine (CLARITIN) 10 MG tablet Take 10 mg by mouth daily.  Marland Kitchen oxyCODONE-acetaminophen (PERCOCET) 10-325 MG per tablet Take 1 tablet by mouth every 6 (six) hours as needed for pain.  . OXYGEN Inhale 2.5 L into the lungs at bedtime.  . senna-docusate (SENOKOT-S) 8.6-50 MG tablet Take 2 tablets by mouth 2 (two) times daily.  Marland Kitchen tiotropium (SPIRIVA) 18 MCG inhalation capsule Place 1 capsule (18 mcg total) into inhaler and inhale daily.  Marland Kitchen tiZANidine (ZANAFLEX) 4 MG tablet Take 4 mg by mouth 3 (three) times daily as needed for muscle spasms.  Marland Kitchen topiramate (TOPAMAX) 25 MG tablet Take 25 mg by mouth 2 (two) times daily.  . [DISCONTINUED] levothyroxine (SYNTHROID, LEVOTHROID) 150 MCG tablet TAKE 1 TABLET BY MOUTH EVERY DAY BEFORE BREAKFAST  . [DISCONTINUED] Vitamin D, Ergocalciferol, (DRISDOL) 50000 units CAPS capsule TAKE 1 CAPSULE BY MOUTH ONCE A WEEK   No facility-administered encounter medications on file as of 02/14/2016.    ALLERGIES: Allergies  Allergen Reactions  . Estrogens Hives  . Mustard [employer]  . Strawberry Extract Hives   VACCINATION STATUS:  There is no immunization history on file for this patient.  HPI 56 yr old female with medical hx of hypothyroidism, HTN, HPL, Osteoporosis, COPD, sleep apnea.  she was previously seen for same hypothyroidism requested by Dr. [name]. She did not keep  her appointments since April 2016.  -she is known to have a habit of disappearing from care. Prior to her last visit she did separate from care for 3 years.  Since last visit, she did have significant interruptions in her levothyroxine. She was supposed to be on 175 g of levothyroxine by mouth every morning.   - she is progressively gaining weight, she feels fatigued.   she continues to smoke cigs. she has family hx of hyperthyroidism in her sister, and unidentified thyroid dysfunction in her father.    Review of Systems Constitutional: -+weight gain, + fatigue, no subjective  hyperthermia/hypothermia Eyes: no blurry vision, no xerophthalmia ENT: no sore throat, no nodules palpated in throat, no dysphagia/odynophagia, no hoarseness Cardiovascular: no CP/SOB/palpitations/leg swelling Respiratory: no cough/SOB Gastrointestinal: no N/V/D/C Musculoskeletal: no muscle/joint aches Skin: no rashes Neurological: no tremors/numbness/tingling/dizziness Psychiatric: no depression/anxiety  Objective:    BP 116/77   Pulse 92   Ht 5\' 9"  (1.753 m)   Wt 277 lb (125.6 kg)   BMI 40.91 kg/m   Wt Readings from Last 3 Encounters:  02/14/16 277 lb (125.6 kg)  07/06/15 263 lb (119.3 kg)  03/29/15 254 lb 4.8 oz (115.3 kg)    Physical Exam Constitutional: obese, in NAD Eyes: PERRLA, EOMI, no exophthalmos ENT: moist mucous membranes, no thyromegaly, + cervical lymphadenopathy Cardiovascular: RRR, No MRG Respiratory: CTA B Gastrointestinal: abdomen soft, NT, ND, BS+ Musculoskeletal: no deformities, strength intact in all 4 Skin: moist, warm, no rashes Neurological: no tremor with outstretched hands, DTR normal in all 4  Recent Results (from the past 2160 hour(s))  T4, free     Status: None   Collection Time: 02/07/16  1:51 PM  Result Value Ref Range   Free T4 1.0 0.8 - 1.8 ng/dL  TSH     Status: Abnormal   Collection Time: 02/07/16  1:53 PM  Result Value Ref Range   TSH 18.84 (H) mIU/L    Comment:   Reference Range   > or = 20 Years  0.40-4.50   Pregnancy Range First trimester  0.26-2.66 Second trimester 0.55-2.73 Third trimester  0.43-2.91         Assessment & Plan:   1. Hypothyroidism, unspecified hypothyroidism type - Her thyroid function tests are indicative of under replacement. I will Increase her levothyroxine to 175 g by mouth every morning.   - We discussed about correct intake of levothyroxine, at fasting, with water, separated by at least 30 minutes from breakfast, and separated by more than 4 hours from calcium, iron, multivitamins, acid  reflux medications (PPIs). -Patient is made aware of the fact that thyroid hormone replacement is needed for life, dose to be adjusted by periodic monitoring of thyroid function tests.   2. Essential hypertension, benign -Controlled. Continue current medications.  3. Hyperlipidemia -Continue Lipitor 10 mg by mouth daily at bedtime.  4. Vitamin D deficiency -Continue vitamin D 5,000 units daily for the next 90 days.  5) multinodular goiter: -Her repeat ultrasound showed unchanged thyroid nodules, suggesting benign nodules which will not require biopsy at this time.  - I advised patient to maintain close follow up with Dr. [name] for primary care needs. Follow up plan: Return in about 6 months (around 08/13/2016) for follow up with pre-visit labs.  Glade Lloyd, MD Phone: [phone]  Fax: [phone]   02/14/2016, 11:01 AM

## 2016-03-22 ENCOUNTER — Other Ambulatory Visit: Payer: Self-pay | Admitting: "Endocrinology

## 2016-07-02 ENCOUNTER — Other Ambulatory Visit: Payer: Self-pay | Admitting: "Endocrinology

## 2016-07-05 ENCOUNTER — Telehealth: Payer: Self-pay | Admitting: Family Medicine

## 2016-07-05 NOTE — Telephone Encounter (Signed)
appt scheduled with Particia Nearing Pt notified

## 2016-07-23 ENCOUNTER — Ambulatory Visit: Payer: Medicaid Other | Admitting: Physician Assistant

## 2016-07-24 ENCOUNTER — Encounter: Payer: Self-pay | Admitting: Physician Assistant

## 2016-08-15 ENCOUNTER — Ambulatory Visit: Payer: Medicaid Other | Admitting: "Endocrinology

## 2016-08-21 ENCOUNTER — Other Ambulatory Visit: Payer: Self-pay | Admitting: "Endocrinology

## 2016-10-01 ENCOUNTER — Other Ambulatory Visit: Payer: Self-pay | Admitting: "Endocrinology

## 2017-01-02 ENCOUNTER — Other Ambulatory Visit: Payer: Self-pay | Admitting: "Endocrinology

## 2017-01-21 ENCOUNTER — Other Ambulatory Visit: Payer: Self-pay | Admitting: "Endocrinology

## 2017-04-29 IMAGING — CT CT ANGIO CHEST
2 of 6 series · 6 of 36 positions shown · IV contrast (Omnipaque 300)
Comparison: CT of the chest 09/26/2012 and x-ray 03/25/2015

CLINICAL DATA: Shortness of breath, COPD, asthma

EXAM:
CT ANGIOGRAPHY CHEST WITH CONTRAST
TECHNIQUE: Multidetector CT imaging of the chest was performed using the
standard protocol during bolus administration of intravenous
contrast. Multiplanar CT image reconstructions and MIPs were
obtained to evaluate the vascular anatomy.
CONTRAST:  150mL OMNIPAQUE IOHEXOL 350 MG/ML SOLN

[Series 5: pe 3.0 b40f · axial · 0.87mm/px · z∈[-315,-99]mm · 5 of 108 slices shown]
[im 18/108  lung]
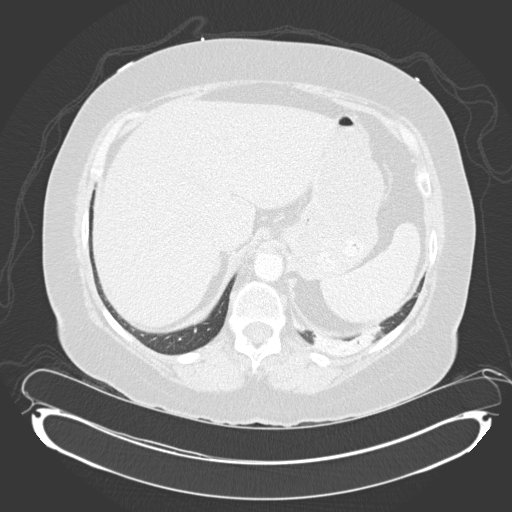
[im 36/108  mediastinal]
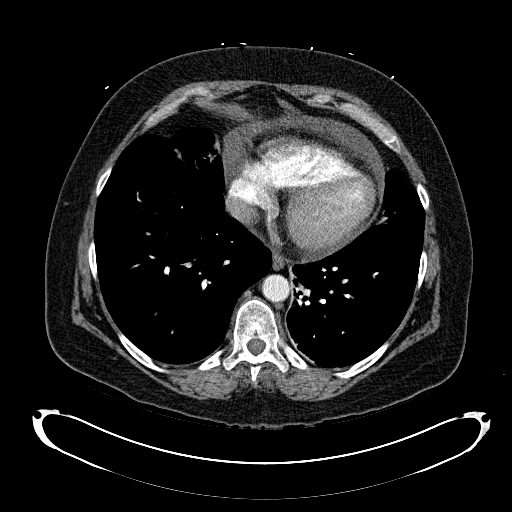
[im 54/108  lung]
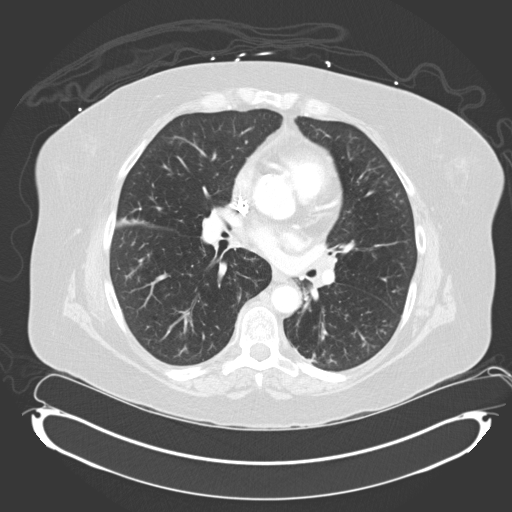
[im 72/108  mediastinal]
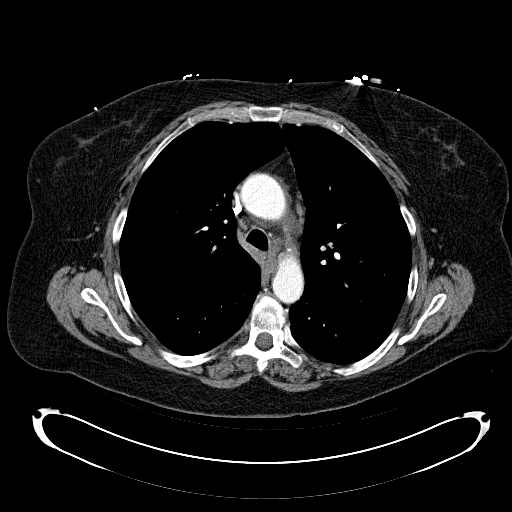
[im 90/108  lung]
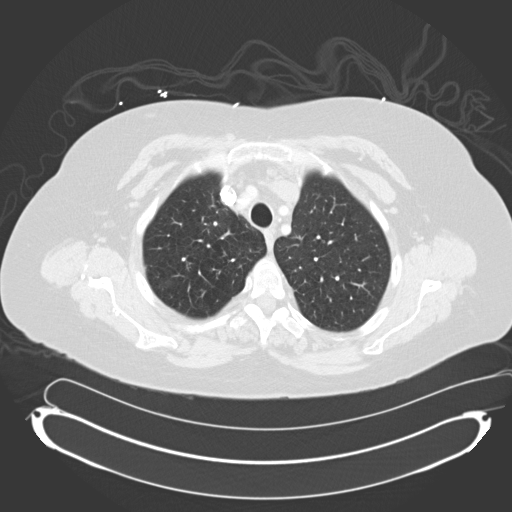

[Series 7: mpr coronal pe 3mm · coronal · 0.65mm/px · 1 of 99 slices shown]
[im 50/99  mediastinal]
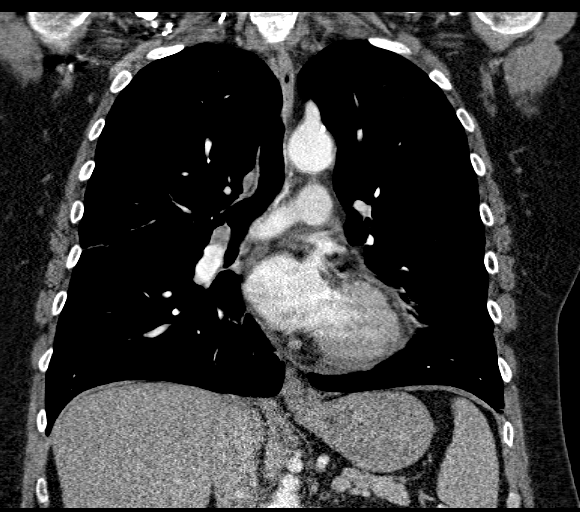

[6 of 36 positions shown; findings below may reference images not displayed]

FINDINGS: Mediastinum/Lymph Nodes: Small to moderate anterior pericardial
effusion is stable. Cardiomediastinal silhouette is stable. There is
no mediastinal adenopathy. Borderline enlarged right infrahilar
lymph node axial image 59 is stable from prior exam measures 1.3 cm
in diameter without change from prior exam. This is probable
reactive.

No pulmonary embolus is noted. Left hilar lymph node measures
short-axis borderline enlarged by size criteria.

Central airways are patent. No mediastinal hematoma. Mild
atherosclerotic calcifications of thoracic aorta.

Lungs/Pleura: Mild atelectasis noted in right middle lobe and
lingula. Small peripheral tree-in-bud infiltrates are noted
bilateral lower lobe highly suspicious for atypical infection.
Please see axial images 81 and 79 on lung window images. There is
additional peripheral consolidation with some air bronchogram in
left lower lobe posterior medially suspicious for focal pneumonia.

There is no pleural effusion. No pleural thickening. Mild bronchitic
changes are noted bilateral lower lobe. Bilateral emphysematous
changes again noted.

Upper abdomen: The visualized liver is unremarkable. Visualized
pancreas and spleen are unremarkable. No adrenal gland mass is
noted. Visualized upper kidneys are unremarkable.

Musculoskeletal: Sagittal images of the spine shows mild
degenerative changes thoracic spine. Sagittal view of the sternum is
unremarkable. No destructive rib lesions are noted.

Review of the MIP images confirms the above findings.
IMPRESSION: 1. No pulmonary embolus is noted.
2. Again noted bilateral borderline hilar adenopathy probable
reactive.
3. Patchy peripheral tree-in-bud infiltrates are noted bilateral
lower lobe. Findings are highly suspicious for atypical infection.
Clinical correlation is necessary. There is additional consolidation
with some air bronchogram in left lower lobe posterior medially
highly suspicious for focal pneumonia. Mild bronchitic changes are
noted bilateral lower lobe.
4. Mild atelectasis noted in right middle lobe and lingula
posteriorly.
5. Stable anterior pericardial effusion.  No cardiomegaly.
6. Mild atherosclerotic calcifications of thoracic aorta.

## 2017-06-06 ENCOUNTER — Ambulatory Visit: Payer: Self-pay | Admitting: "Endocrinology

## 2018-05-27 ENCOUNTER — Ambulatory Visit: Payer: Medicaid Other | Admitting: "Endocrinology

## 2019-06-09 ENCOUNTER — Other Ambulatory Visit: Payer: Self-pay

## 2019-06-09 ENCOUNTER — Encounter (HOSPITAL_COMMUNITY): Payer: Self-pay

## 2019-06-09 ENCOUNTER — Emergency Department (HOSPITAL_COMMUNITY): Payer: Medicaid Other

## 2019-06-09 ENCOUNTER — Inpatient Hospital Stay (HOSPITAL_COMMUNITY): Payer: Medicaid Other

## 2019-06-09 ENCOUNTER — Inpatient Hospital Stay (HOSPITAL_COMMUNITY)
Admission: EM | Admit: 2019-06-09 | Discharge: 2019-06-19 | DRG: 208 | Disposition: A | Discharge status: Home / Self Care | Payer: Medicaid Other | Attending: Internal Medicine | Admitting: Internal Medicine

## 2019-06-09 DIAGNOSIS — F17210 Nicotine dependence, cigarettes, uncomplicated: Secondary | ICD-10-CM | POA: Diagnosis present

## 2019-06-09 DIAGNOSIS — Z6841 Body Mass Index (BMI) 40.0 and over, adult: Secondary | ICD-10-CM

## 2019-06-09 DIAGNOSIS — G9341 Metabolic encephalopathy: Secondary | ICD-10-CM | POA: Diagnosis present

## 2019-06-09 DIAGNOSIS — Z79890 Hormone replacement therapy (postmenopausal): Secondary | ICD-10-CM

## 2019-06-09 DIAGNOSIS — R680 Hypothermia, not associated with low environmental temperature: Secondary | ICD-10-CM | POA: Diagnosis present

## 2019-06-09 DIAGNOSIS — J9811 Atelectasis: Secondary | ICD-10-CM | POA: Diagnosis not present

## 2019-06-09 DIAGNOSIS — E872 Acidosis: Secondary | ICD-10-CM | POA: Diagnosis present

## 2019-06-09 DIAGNOSIS — I10 Essential (primary) hypertension: Secondary | ICD-10-CM | POA: Diagnosis present

## 2019-06-09 DIAGNOSIS — J9621 Acute and chronic respiratory failure with hypoxia: Secondary | ICD-10-CM | POA: Diagnosis present

## 2019-06-09 DIAGNOSIS — F329 Major depressive disorder, single episode, unspecified: Secondary | ICD-10-CM | POA: Diagnosis present

## 2019-06-09 DIAGNOSIS — E782 Mixed hyperlipidemia: Secondary | ICD-10-CM | POA: Diagnosis not present

## 2019-06-09 DIAGNOSIS — D689 Coagulation defect, unspecified: Secondary | ICD-10-CM | POA: Diagnosis present

## 2019-06-09 DIAGNOSIS — E785 Hyperlipidemia, unspecified: Secondary | ICD-10-CM | POA: Diagnosis present

## 2019-06-09 DIAGNOSIS — I4891 Unspecified atrial fibrillation: Secondary | ICD-10-CM | POA: Diagnosis present

## 2019-06-09 DIAGNOSIS — F419 Anxiety disorder, unspecified: Secondary | ICD-10-CM | POA: Diagnosis present

## 2019-06-09 DIAGNOSIS — J440 Chronic obstructive pulmonary disease with acute lower respiratory infection: Secondary | ICD-10-CM | POA: Diagnosis present

## 2019-06-09 DIAGNOSIS — D696 Thrombocytopenia, unspecified: Secondary | ICD-10-CM | POA: Diagnosis not present

## 2019-06-09 DIAGNOSIS — Z978 Presence of other specified devices: Secondary | ICD-10-CM

## 2019-06-09 DIAGNOSIS — Z79899 Other long term (current) drug therapy: Secondary | ICD-10-CM

## 2019-06-09 DIAGNOSIS — J181 Lobar pneumonia, unspecified organism: Secondary | ICD-10-CM | POA: Diagnosis not present

## 2019-06-09 DIAGNOSIS — Z20822 Contact with and (suspected) exposure to covid-19: Secondary | ICD-10-CM | POA: Diagnosis present

## 2019-06-09 DIAGNOSIS — J151 Pneumonia due to Pseudomonas: Secondary | ICD-10-CM | POA: Diagnosis present

## 2019-06-09 DIAGNOSIS — E039 Hypothyroidism, unspecified: Secondary | ICD-10-CM | POA: Diagnosis present

## 2019-06-09 DIAGNOSIS — J90 Pleural effusion, not elsewhere classified: Secondary | ICD-10-CM | POA: Diagnosis present

## 2019-06-09 DIAGNOSIS — N179 Acute kidney failure, unspecified: Secondary | ICD-10-CM | POA: Diagnosis not present

## 2019-06-09 DIAGNOSIS — J441 Chronic obstructive pulmonary disease with (acute) exacerbation: Principal | ICD-10-CM | POA: Diagnosis present

## 2019-06-09 DIAGNOSIS — J9690 Respiratory failure, unspecified, unspecified whether with hypoxia or hypercapnia: Secondary | ICD-10-CM

## 2019-06-09 DIAGNOSIS — Z91018 Allergy to other foods: Secondary | ICD-10-CM | POA: Diagnosis not present

## 2019-06-09 DIAGNOSIS — R58 Hemorrhage, not elsewhere classified: Secondary | ICD-10-CM | POA: Diagnosis not present

## 2019-06-09 DIAGNOSIS — J449 Chronic obstructive pulmonary disease, unspecified: Secondary | ICD-10-CM | POA: Diagnosis not present

## 2019-06-09 DIAGNOSIS — I952 Hypotension due to drugs: Secondary | ICD-10-CM | POA: Diagnosis present

## 2019-06-09 DIAGNOSIS — Z79891 Long term (current) use of opiate analgesic: Secondary | ICD-10-CM

## 2019-06-09 DIAGNOSIS — R0602 Shortness of breath: Secondary | ICD-10-CM

## 2019-06-09 DIAGNOSIS — T148XXA Other injury of unspecified body region, initial encounter: Secondary | ICD-10-CM | POA: Diagnosis not present

## 2019-06-09 DIAGNOSIS — J9622 Acute and chronic respiratory failure with hypercapnia: Secondary | ICD-10-CM | POA: Diagnosis present

## 2019-06-09 DIAGNOSIS — I2510 Atherosclerotic heart disease of native coronary artery without angina pectoris: Secondary | ICD-10-CM | POA: Diagnosis present

## 2019-06-09 DIAGNOSIS — J9602 Acute respiratory failure with hypercapnia: Secondary | ICD-10-CM

## 2019-06-09 DIAGNOSIS — G92 Toxic encephalopathy: Secondary | ICD-10-CM | POA: Diagnosis not present

## 2019-06-09 DIAGNOSIS — Z888 Allergy status to other drugs, medicaments and biological substances status: Secondary | ICD-10-CM | POA: Diagnosis not present

## 2019-06-09 DIAGNOSIS — E1165 Type 2 diabetes mellitus with hyperglycemia: Secondary | ICD-10-CM | POA: Diagnosis present

## 2019-06-09 DIAGNOSIS — G894 Chronic pain syndrome: Secondary | ICD-10-CM | POA: Diagnosis present

## 2019-06-09 DIAGNOSIS — G4733 Obstructive sleep apnea (adult) (pediatric): Secondary | ICD-10-CM | POA: Diagnosis present

## 2019-06-09 DIAGNOSIS — T380X5A Adverse effect of glucocorticoids and synthetic analogues, initial encounter: Secondary | ICD-10-CM | POA: Diagnosis present

## 2019-06-09 DIAGNOSIS — T4275XA Adverse effect of unspecified antiepileptic and sedative-hypnotic drugs, initial encounter: Secondary | ICD-10-CM | POA: Diagnosis present

## 2019-06-09 HISTORY — DX: Essential (primary) hypertension: I10

## 2019-06-09 HISTORY — DX: Type 2 diabetes mellitus without complications: E11.9

## 2019-06-09 LAB — BLOOD GAS, ARTERIAL
Acid-Base Excess: 11.7 mmol/L — ABNORMAL HIGH (ref 0.0–2.0)
Bicarbonate: 32.5 mmol/L — ABNORMAL HIGH (ref 20.0–28.0)
FIO2: 50
FIO2: 40
O2 Saturation: 94 %
O2 Saturation: 87.8 %
Patient temperature: 35.6
Patient temperature: 36.6
pCO2 arterial: >120 mmHg (ref 32.0–48.0)
pCO2 arterial: 81.3 mmHg (ref 32.0–48.0)
pH, Arterial: 7.172 — CL (ref 7.350–7.450)
pH, Arterial: 7.295 — ABNORMAL LOW (ref 7.350–7.450)
pO2, Arterial: 71.2 mmHg — ABNORMAL LOW (ref 83.0–108.0)
pO2, Arterial: 47.7 mmHg — ABNORMAL LOW (ref 83.0–108.0)

## 2019-06-09 LAB — TROPONIN I (HIGH SENSITIVITY)
Troponin I (High Sensitivity): 12 ng/L (ref <18)
Troponin I (High Sensitivity): 14 ng/L (ref <18)

## 2019-06-09 LAB — URINALYSIS, ROUTINE W REFLEX MICROSCOPIC
Bilirubin Urine: NEGATIVE
Glucose, UA: NEGATIVE mg/dL
Ketones, ur: 5 mg/dL — AB
Leukocytes,Ua: NEGATIVE
Nitrite: NEGATIVE
Protein, ur: >=300 mg/dL — AB
Specific Gravity, Urine: 1.024 (ref 1.005–1.030)
pH: 5 (ref 5.0–8.0)

## 2019-06-09 LAB — HEMOGLOBIN A1C
Hgb A1c MFr Bld: 5.1 % (ref 4.8–5.6)
Mean Plasma Glucose: 99.67 mg/dL

## 2019-06-09 LAB — HIV ANTIBODY (ROUTINE TESTING W REFLEX): HIV Screen 4th Generation wRfx: NONREACTIVE

## 2019-06-09 LAB — LACTIC ACID, PLASMA: Lactic Acid, Venous: 1.4 mmol/L (ref 0.5–1.9)

## 2019-06-09 LAB — CBC
HCT: 49.8 % — ABNORMAL HIGH (ref 36.0–46.0)
Hemoglobin: 14.2 g/dL (ref 12.0–15.0)
MCH: 33.9 pg (ref 26.0–34.0)
MCHC: 28.5 g/dL — ABNORMAL LOW (ref 30.0–36.0)
MCV: 118.9 fL — ABNORMAL HIGH (ref 80.0–100.0)
Platelets: 142 10*3/uL — ABNORMAL LOW (ref 150–400)
RBC: 4.19 MIL/uL (ref 3.87–5.11)
RDW: 13.4 % (ref 11.5–15.5)
WBC: 7.7 10*3/uL (ref 4.0–10.5)
nRBC: 0.6 % — ABNORMAL HIGH (ref 0.0–0.2)

## 2019-06-09 LAB — TSH: TSH: 32.331 u[IU]/mL — ABNORMAL HIGH (ref 0.350–4.500)

## 2019-06-09 LAB — BRAIN NATRIURETIC PEPTIDE: B Natriuretic Peptide: 444 pg/mL — ABNORMAL HIGH (ref 0.0–100.0)

## 2019-06-09 LAB — MRSA PCR SCREENING: MRSA by PCR: NEGATIVE

## 2019-06-09 LAB — GLUCOSE, CAPILLARY
Glucose-Capillary: 117 mg/dL — ABNORMAL HIGH (ref 70–99)
Glucose-Capillary: 107 mg/dL — ABNORMAL HIGH (ref 70–99)

## 2019-06-09 LAB — PROCALCITONIN: Procalcitonin: <0.1 ng/mL

## 2019-06-09 LAB — BASIC METABOLIC PANEL
Anion gap: 14 (ref 5–15)
BUN: 10 mg/dL (ref 6–20)
CO2: 38 mmol/L — ABNORMAL HIGH (ref 22–32)
Calcium: 9.2 mg/dL (ref 8.9–10.3)
Chloride: 91 mmol/L — ABNORMAL LOW (ref 98–111)
Creatinine, Ser: 0.7 mg/dL (ref 0.44–1.00)
GFR calc Af Amer: >60 mL/min (ref >60)
GFR calc non Af Amer: >60 mL/min (ref >60)
Glucose, Bld: 131 mg/dL — ABNORMAL HIGH (ref 70–99)
Potassium: 4.4 mmol/L (ref 3.5–5.1)
Sodium: 143 mmol/L (ref 135–145)

## 2019-06-09 LAB — PHOSPHORUS
Phosphorus: 3.2 mg/dL (ref 2.5–4.6)
Phosphorus: 3.2 mg/dL (ref 2.5–4.6)

## 2019-06-09 LAB — ECHOCARDIOGRAM COMPLETE
Height: 69 in
Weight: 4430.36 [oz_av]

## 2019-06-09 LAB — FOLATE: Folate: 5.2 ng/mL — ABNORMAL LOW (ref >5.9)

## 2019-06-09 LAB — VITAMIN B12: Vitamin B-12: 188 pg/mL (ref 180–914)

## 2019-06-09 LAB — MAGNESIUM
Magnesium: 2.3 mg/dL (ref 1.7–2.4)
Magnesium: 2.3 mg/dL (ref 1.7–2.4)

## 2019-06-09 LAB — SARS CORONAVIRUS 2 BY RT PCR (HOSPITAL ORDER, PERFORMED IN ~~LOC~~ HOSPITAL LAB): SARS Coronavirus 2: NEGATIVE

## 2019-06-09 MED ORDER — DOXYCYCLINE HYCLATE 100 MG PO TABS
100.0000 mg | ORAL_TABLET | Freq: Once | ORAL | Status: AC
  Administered 2019-06-09: 100 mg
  Filled 2019-06-09: qty 1

## 2019-06-09 MED ORDER — SODIUM CHLORIDE 0.9 % IV BOLUS (SEPSIS)
1000.0000 mL | Freq: Once | INTRAVENOUS | Status: AC
  Administered 2019-06-09: 1000 mL via INTRAVENOUS

## 2019-06-09 MED ORDER — LEVOTHYROXINE SODIUM 100 MCG/5ML IV SOLN: 87.5000 ug | Freq: Every day | INTRAVENOUS | Status: DC

## 2019-06-09 MED ORDER — FENTANYL BOLUS VIA INFUSION
50.0000 ug | INTRAVENOUS | Status: DC | PRN
  Administered 2019-06-09 – 2019-06-11 (×13): 50 ug via INTRAVENOUS
  Filled 2019-06-09: qty 50

## 2019-06-09 MED ORDER — FENTANYL CITRATE (PF) 2500 MCG/50ML IJ SOLN
INTRAMUSCULAR | Status: AC
  Filled 2019-06-09: qty 50

## 2019-06-09 MED ORDER — DOXYCYCLINE HYCLATE 100 MG PO TABS
100.0000 mg | ORAL_TABLET | Freq: Two times a day (BID) | ORAL | Status: DC
  Administered 2019-06-10 – 2019-06-11 (×3): 100 mg
  Filled 2019-06-09 (×3): qty 1

## 2019-06-09 MED ORDER — CHLORHEXIDINE GLUCONATE CLOTH 2 % EX PADS
6.0000 | MEDICATED_PAD | Freq: Every day | CUTANEOUS | Status: DC
  Administered 2019-06-10 – 2019-06-15 (×6): 6 via TOPICAL

## 2019-06-09 MED ORDER — ACETAMINOPHEN 650 MG RE SUPP: 650.0000 mg | Freq: Four times a day (QID) | RECTAL | Status: DC | PRN

## 2019-06-09 MED ORDER — ONDANSETRON HCL 4 MG PO TABS: 4.0000 mg | ORAL_TABLET | Freq: Four times a day (QID) | ORAL | Status: DC | PRN

## 2019-06-09 MED ORDER — SUCCINYLCHOLINE CHLORIDE 20 MG/ML IJ SOLN
INTRAMUSCULAR | Status: AC | PRN
Start: 2019-06-09 — End: 2019-06-09
  Administered 2019-06-09: 120 mg via INTRAVENOUS

## 2019-06-09 MED ORDER — MIDAZOLAM HCL 2 MG/2ML IJ SOLN
2.0000 mg | INTRAMUSCULAR | Status: AC | PRN
  Administered 2019-06-09 (×3): 2 mg via INTRAVENOUS
  Filled 2019-06-09 (×3): qty 2

## 2019-06-09 MED ORDER — ETOMIDATE 2 MG/ML IV SOLN
INTRAVENOUS | Status: AC | PRN
  Administered 2019-06-09: 20 mg via INTRAVENOUS

## 2019-06-09 MED ORDER — FENTANYL 2500MCG IN NS 250ML (10MCG/ML) PREMIX INFUSION
25.0000 ug/h | INTRAVENOUS | Status: DC
  Filled 2019-06-09: qty 250

## 2019-06-09 MED ORDER — MIDAZOLAM HCL 5 MG/5ML IJ SOLN
5.0000 mg | Freq: Once | INTRAMUSCULAR | Status: DC
  Filled 2019-06-09: qty 5

## 2019-06-09 MED ORDER — METHYLPREDNISOLONE SODIUM SUCC 125 MG IJ SOLR
60.0000 mg | Freq: Three times a day (TID) | INTRAMUSCULAR | Status: DC
  Administered 2019-06-09 – 2019-06-10 (×3): 60 mg via INTRAVENOUS
  Filled 2019-06-09 (×3): qty 2

## 2019-06-09 MED ORDER — ORAL CARE MOUTH RINSE
15.0000 mL | OROMUCOSAL | Status: DC
  Administered 2019-06-09 – 2019-06-13 (×35): 15 mL via OROMUCOSAL

## 2019-06-09 MED ORDER — MIDAZOLAM HCL 2 MG/2ML IJ SOLN
2.0000 mg | INTRAMUSCULAR | Status: DC | PRN
  Administered 2019-06-10 – 2019-06-11 (×13): 2 mg via INTRAVENOUS
  Filled 2019-06-09 (×13): qty 2

## 2019-06-09 MED ORDER — SODIUM CHLORIDE 0.9 % IV BOLUS (SEPSIS): 1000.0000 mL | Freq: Once | INTRAVENOUS | Status: DC

## 2019-06-09 MED ORDER — ALBUTEROL SULFATE (2.5 MG/3ML) 0.083% IN NEBU
5.0000 mg | INHALATION_SOLUTION | Freq: Once | RESPIRATORY_TRACT | Status: AC
  Administered 2019-06-09: 5 mg via RESPIRATORY_TRACT
  Filled 2019-06-09: qty 6

## 2019-06-09 MED ORDER — FENTANYL CITRATE (PF) 100 MCG/2ML IJ SOLN
50.0000 ug | Freq: Once | INTRAMUSCULAR | Status: AC
  Administered 2019-06-09: 50 ug via INTRAVENOUS
  Filled 2019-06-09: qty 2

## 2019-06-09 MED ORDER — ESCITALOPRAM OXALATE 10 MG PO TABS
10.0000 mg | ORAL_TABLET | Freq: Every day | ORAL | Status: DC
  Administered 2019-06-09 – 2019-06-11 (×3): 10 mg
  Filled 2019-06-09 (×3): qty 1

## 2019-06-09 MED ORDER — PANTOPRAZOLE SODIUM 40 MG PO PACK
40.0000 mg | PACK | ORAL | Status: DC
  Administered 2019-06-09 – 2019-06-13 (×5): 40 mg
  Filled 2019-06-09 (×6): qty 20

## 2019-06-09 MED ORDER — PANTOPRAZOLE SODIUM 40 MG IV SOLR: 40.0000 mg | INTRAVENOUS | Status: DC

## 2019-06-09 MED ORDER — SODIUM CHLORIDE 0.9 % IV SOLN
1000.0000 mL | INTRAVENOUS | Status: DC
  Administered 2019-06-09: 1000 mL via INTRAVENOUS

## 2019-06-09 MED ORDER — RIVAROXABAN 20 MG PO TABS
20.0000 mg | ORAL_TABLET | Freq: Every day | ORAL | Status: DC
  Administered 2019-06-10 – 2019-06-11 (×2): 20 mg via ORAL
  Filled 2019-06-09 (×2): qty 1

## 2019-06-09 MED ORDER — ACETAMINOPHEN 325 MG PO TABS
650.0000 mg | ORAL_TABLET | Freq: Four times a day (QID) | ORAL | Status: DC | PRN
Start: 2019-06-09 — End: 2019-06-09

## 2019-06-09 MED ORDER — VITAL HIGH PROTEIN PO LIQD
1000.0000 mL | ORAL | Status: DC
  Administered 2019-06-09: 1000 mL
  Filled 2019-06-09: qty 1000

## 2019-06-09 MED ORDER — ALBUTEROL SULFATE HFA 108 (90 BASE) MCG/ACT IN AERS: 4.0000 | INHALATION_SPRAY | Freq: Once | RESPIRATORY_TRACT | Status: DC

## 2019-06-09 MED ORDER — LEVOTHYROXINE SODIUM 75 MCG PO TABS
175.0000 ug | ORAL_TABLET | Freq: Every day | ORAL | Status: DC
  Administered 2019-06-10 – 2019-06-11 (×2): 175 ug
  Filled 2019-06-09 (×2): qty 1

## 2019-06-09 MED ORDER — ONDANSETRON HCL 4 MG/2ML IJ SOLN
4.0000 mg | Freq: Four times a day (QID) | INTRAMUSCULAR | Status: DC | PRN
  Administered 2019-06-10 – 2019-06-14 (×3): 4 mg via INTRAVENOUS
  Filled 2019-06-09 (×3): qty 2

## 2019-06-09 MED ORDER — ENOXAPARIN SODIUM 40 MG/0.4ML ~~LOC~~ SOLN: 40.0000 mg | SUBCUTANEOUS | Status: DC

## 2019-06-09 MED ORDER — ATORVASTATIN CALCIUM 20 MG PO TABS
20.0000 mg | ORAL_TABLET | Freq: Every day | ORAL | Status: DC
  Administered 2019-06-09 – 2019-06-10 (×2): 20 mg
  Filled 2019-06-09 (×2): qty 1

## 2019-06-09 MED ORDER — ESCITALOPRAM OXALATE 10 MG PO TABS: 10.0000 mg | ORAL_TABLET | Freq: Every day | ORAL | Status: DC

## 2019-06-09 MED ORDER — BUDESONIDE 0.5 MG/2ML IN SUSP
0.5000 mg | Freq: Two times a day (BID) | RESPIRATORY_TRACT | Status: DC
  Administered 2019-06-09 – 2019-06-19 (×20): 0.5 mg via RESPIRATORY_TRACT
  Filled 2019-06-09 (×21): qty 2

## 2019-06-09 MED ORDER — INSULIN ASPART 100 UNIT/ML ~~LOC~~ SOLN
0.0000 [IU] | SUBCUTANEOUS | Status: DC
  Administered 2019-06-10: 3 [IU] via SUBCUTANEOUS
  Administered 2019-06-10: 4 [IU] via SUBCUTANEOUS
  Administered 2019-06-10 – 2019-06-12 (×10): 3 [IU] via SUBCUTANEOUS
  Administered 2019-06-12: 4 [IU] via SUBCUTANEOUS
  Administered 2019-06-12: 3 [IU] via SUBCUTANEOUS
  Administered 2019-06-12: 4 [IU] via SUBCUTANEOUS
  Administered 2019-06-13: 3 [IU] via SUBCUTANEOUS
  Administered 2019-06-17: 4 [IU] via SUBCUTANEOUS
  Administered 2019-06-18 (×2): 3 [IU] via SUBCUTANEOUS
  Administered 2019-06-19: 4 [IU] via SUBCUTANEOUS

## 2019-06-09 MED ORDER — SODIUM CHLORIDE 0.9 % IV SOLN
25.0000 ug/h | INTRAVENOUS | Status: DC
  Administered 2019-06-09: 50 ug/h via INTRAVENOUS
  Administered 2019-06-10: 350 ug/h via INTRAVENOUS
  Administered 2019-06-10: 175 ug/h via INTRAVENOUS
  Administered 2019-06-10: 350 ug/h via INTRAVENOUS
  Administered 2019-06-11 (×2): 400 ug/h via INTRAVENOUS
  Filled 2019-06-09 (×4): qty 50

## 2019-06-09 MED ORDER — IPRATROPIUM-ALBUTEROL 0.5-2.5 (3) MG/3ML IN SOLN
3.0000 mL | Freq: Four times a day (QID) | RESPIRATORY_TRACT | Status: DC
  Administered 2019-06-09 – 2019-06-11 (×10): 3 mL via RESPIRATORY_TRACT
  Filled 2019-06-09 (×12): qty 3

## 2019-06-09 MED ORDER — METHYLPREDNISOLONE SODIUM SUCC 125 MG IJ SOLR
125.0000 mg | Freq: Once | INTRAMUSCULAR | Status: AC
  Administered 2019-06-09: 125 mg via INTRAVENOUS
  Filled 2019-06-09: qty 2

## 2019-06-09 MED ORDER — SODIUM CHLORIDE 0.9 % IV SOLN
1000.0000 mL | INTRAVENOUS | Status: DC
  Administered 2019-06-10 – 2019-06-11 (×3): 1000 mL via INTRAVENOUS

## 2019-06-09 MED ORDER — ACETAMINOPHEN 325 MG PO TABS
650.0000 mg | ORAL_TABLET | Freq: Four times a day (QID) | ORAL | Status: DC | PRN
  Administered 2019-06-09 – 2019-06-10 (×2): 650 mg
  Filled 2019-06-09 (×2): qty 2

## 2019-06-09 MED ORDER — ATORVASTATIN CALCIUM 10 MG PO TABS: 20.0000 mg | ORAL_TABLET | Freq: Every day | ORAL | Status: DC

## 2019-06-09 MED ORDER — MAGNESIUM SULFATE 2 GM/50ML IV SOLN
2.0000 g | Freq: Once | INTRAVENOUS | Status: AC
  Administered 2019-06-09: 2 g via INTRAVENOUS
  Filled 2019-06-09: qty 50

## 2019-06-09 MED ORDER — CHLORHEXIDINE GLUCONATE 0.12% ORAL RINSE (MEDLINE KIT)
15.0000 mL | Freq: Two times a day (BID) | OROMUCOSAL | Status: DC
  Administered 2019-06-09 – 2019-06-12 (×7): 15 mL via OROMUCOSAL

## 2019-06-09 NOTE — Consult Note (Signed)
NAME:  Terri Casey, MRN:  [id number], DOB:  07-24-1960, LOS: 0 ADMISSION DATE:  06/09/2019, CONSULTATION DATE:  06/09/2019 REFERRING MD:  Dr. Carles Collet, [facility], CHIEF COMPLAINT:  Short of breath   Brief History   59 yo female smoker brought to ER with dyspnea and altered mental status from acute on chronic hypoxic/hypercapnic respiratory failure from COPD exacerbation.  She required intubation in ER.  History of present illness   Hx from chart and medical staff.  She started feeling more short of breath over the past 3 days.  Noted to have change in mental status and sleeping more.  Found to have low oxygen by EMS.  Brought to ER.  ABG showed respiratory acidosis.  Required intubation.  Started on nebs, steroids, and sedation.    Past Medical History  COPD on 2.5 liters, HTN, HLD, Hypothyroidism, CAD, OSA, A fib  Significant Hospital Events   6/08 Admit  Consults:    Procedures:  ETT 6/08 >>   Significant Diagnostic Tests:  Echo 6/08 >>   Micro Data:  SARS CoV2 PCR 6/08 >> negative Blood 6/08 >>  Sputum 6/08 >> Respiratory viral panel 6/08 >>   Antimicrobials:  Doxycycline 6/08 >>   Interim history/subjective:    Objective   Blood pressure 91/63, pulse 72, temperature 98.2 F (36.8 C), resp. rate (!) 24, height 5\' 9"  (1.753 m), weight 125.6 kg, SpO2 92 %.    Vent Mode: PRVC FiO2 (%):  [40 %-50 %] 40 % Set Rate:  [18 bmp-24 bmp] 24 bmp Vt Set:  [510 mL-550 mL] 550 mL PEEP:  [5 cmH20] 5 cmH20 Plateau Pressure:  [25 cmH20] 25 cmH20   Intake/Output Summary (Last 24 hours) at 06/09/2019 1309 Last data filed at 06/09/2019 1211 Gross per 24 hour  Intake 1050 ml  Output --  Net 1050 ml   Filed Weights   06/09/19 0919  Weight: 125.6 kg    Examination:  General - sedated Eyes - pupils reactive ENT - ETT in place Cardiac - regular rate/rhythm, no murmur Chest - b/l expiratory wheezing Abdomen - soft, non tender, + bowel sounds Extremities - no cyanosis, clubbing,  or edema Skin - no rashes Neuro - RASS -3   Resolved Hospital Problem list     Assessment & Plan:   Acute on chronic hypoxic/hypercapnic respiratory failure from COPD exacerbation, sleep disordered breathing, and on going tobacco abuse. - f/u vent support - goal SpO2 88 to 95% - f/u CXR - scheduled BDs - continue solumedrol - check respiratory viral panel, sputum culture - add doxycycline - will need med reconciliation prior to discharge to determine which inhalers she has been using at home  Acute metabolic encephalopathy from hypercapnia. Hx of chronic pain, depression. - RASS goal 0 to -1 - fentanyl gtt with prn versed - can add precedex if needed  Steroid induced hyperglycemia. - SSI  Hypotension from sedation. - continue IV fluids  Hx of HTN, HLD, a fib present prior to admission. - f/u Echo - continue lipitor, xarelto  Hx of hypothyroidism. - continue synthroid  Best practice:  Diet: tube feeds DVT prophylaxis: xarelto GI prophylaxis: protonix Mobility: bed rest Code Status: full code  Labs:   CMP Latest Ref Rng & Units 06/09/2019 03/29/2015 03/28/2015  Glucose 70 - 99 mg/dL 131(H) 142(H) 138(H)  BUN 6 - 20 mg/dL 10 27(H) 26(H)  Creatinine 0.44 - 1.00 mg/dL 0.70 0.91 0.88  Sodium 135 - 145 mmol/L 143 140 141  Potassium 3.5 -  5.1 mmol/L 4.4 4.4 4.3  Chloride 98 - 111 mmol/L 91(L) 101 101  CO2 22 - 32 mmol/L 38(H) 30 30  Calcium 8.9 - 10.3 mg/dL 9.2 8.9 9.0  Total Protein 6.5 - 8.1 g/dL - - -  Total Bilirubin 0.3 - 1.2 mg/dL - - -  Alkaline Phos 38 - 126 U/L - - -  AST 15 - 41 U/L - - -  ALT 14 - 54 U/L - - -    CBC Latest Ref Rng & Units 06/09/2019 03/29/2015 03/28/2015  WBC 4.0 - 10.5 K/uL 7.7 10.7(H) 9.8  Hemoglobin 12.0 - 15.0 g/dL 14.2 12.7 12.9  Hematocrit 36.0 - 46.0 % 49.8(H) 41.5 40.8  Platelets 150 - 400 K/uL 142(L) 276 270    ABG    Component Value Date/Time   PHART 7.295 (L) 06/09/2019 1225   PCO2ART 81.3 (HH) 06/09/2019 1225    PO2ART 47.7 (L) 06/09/2019 1225   HCO3 32.5 (H) 06/09/2019 1225   TCO2 23.8 09/26/2012 2240   O2SAT 87.8 06/09/2019 1225    CBG (last 3)  No results for input(s): GLUCAP in the last 72 hours.   Review of Systems:   Unable to obtain.  Past Medical History  She,  has a past medical history of Anxiety, Asthma, COPD (chronic obstructive pulmonary disease) ([place]), Diabetes mellitus without complication ([place]), Hypertension, and Thyroid disease.   Surgical History    Past Surgical History:  Procedure Laterality Date  . ABDOMINAL HYSTERECTOMY       Social History   reports that she has been smoking. She has been smoking about 0.50 packs per day. She has never used smokeless tobacco. She reports that she does not drink alcohol or use drugs.   Family History   Her family history is not on file.   Allergies Allergies  Allergen Reactions  . Estrogens Hives  . Mustard [employer]  . Strawberry Extract Hives     Home Medications  Prior to Admission medications   Medication Sig Start Date End Date Taking? Authorizing Provider  ADVAIR DISKUS 500-50 MCG/DOSE AEPB Inhale 1 puff into the lungs every 12 (twelve) hours. 05/08/19  Yes [provider]  albuterol (PROVENTIL) (2.5 MG/3ML) 0.083% nebulizer solution Inhale 3 mLs into the lungs in the morning, at noon, in the evening, and at bedtime. 09/10/17  Yes [provider]  ascorbic acid (GNP VITAMIN C) 1000 MG tablet Take 1,000 mg by mouth daily.   Yes [provider]  atorvastatin (LIPITOR) 20 MG tablet Take 20 mg by mouth at bedtime.    Yes [provider]  cyclobenzaprine (FLEXERIL) 5 MG tablet Take 5 mg by mouth 3 (three) times daily. 02/23/19  Yes [provider]  escitalopram (LEXAPRO) 10 MG tablet Take 10 mg by mouth daily. 05/08/19  Yes [provider]  famotidine (PEPCID) 20 MG tablet Take 20 mg by mouth 2 (two) times daily. 02/10/19  Yes [provider]  levothyroxine  (SYNTHROID, LEVOTHROID) 175 MCG tablet TAKE 1 TABLET BY MOUTH EVERY DAY BEFORE BREAKFAST Patient taking differently: Take 350 mcg by mouth daily before breakfast.  08/22/16  Yes Nida, Marella Chimes, MD  loratadine (CLARITIN) 10 MG tablet Take 10 mg by mouth daily.   Yes [provider]  oxyCODONE-acetaminophen (PERCOCET) 10-325 MG per tablet Take 1 tablet by mouth in the morning, at noon, in the evening, and at bedtime.    Yes [provider]  Vitamin D, Ergocalciferol, (DRISDOL) 50000 units CAPS capsule TAKE  ONE CAPSULE BY MOUTH ONCE A WEEK 10/01/16  Yes Nida, Marella Chimes, MD  XARELTO 20 MG TABS tablet Take 20 mg by mouth daily. 05/08/19  Yes [provider]  Zinc 50 MG TABS Take 1 tablet by mouth daily.   Yes [provider]  albuterol (PROAIR HFA) 108 (90 BASE) MCG/ACT inhaler Inhale 2 puffs into the lungs every 6 (six) hours as needed for wheezing.    [provider]  albuterol-ipratropium (COMBIVENT) 18-103 MCG/ACT inhaler Inhale into the lungs every 4 (four) hours.    [provider]  ALPRAZolam Duanne Moron) 1 MG tablet Take 1 mg by mouth 2 (two) times daily.    [provider]  Cholecalciferol (VITAMIN D3) 5000 units CAPS Take 1 capsule (5,000 Units total) by mouth daily. 02/14/16   Cassandria Anger, MD  citalopram (CELEXA) 20 MG tablet Take 20 mg by mouth daily.    [provider]  fluticasone (FLONASE) 50 MCG/ACT nasal spray Place 2 sprays into both nostrils daily. Patient taking differently: Place 1 spray into both nostrils daily.  03/29/15   Eugenie Filler, MD  ipratropium-albuterol (DUONEB) 0.5-2.5 (3) MG/3ML SOLN Take 3 mLs by nebulization every 2 (two) hours as needed. Use 3 times daily x 4 days, then every 2 hours as needed. 03/29/15   Eugenie Filler, MD  meclizine (ANTIVERT) 25 MG tablet Take 25 mg by mouth 3 (three) times daily as needed.    [provider]  OXYGEN Inhale 2.5 L into the lungs at  bedtime.    [provider]  senna-docusate (SENOKOT-S) 8.6-50 MG tablet Take 2 tablets by mouth 2 (two) times daily. 03/29/15   Eugenie Filler, MD  STIOLTO RESPIMAT 2.5-2.5 MCG/ACT AERS Inhale 2 puffs into the lungs daily. 05/08/19   [provider]  tiotropium (SPIRIVA) 18 MCG inhalation capsule Place 1 capsule (18 mcg total) into inhaler and inhale daily. 04/02/15   Eugenie Filler, MD  tiZANidine (ZANAFLEX) 4 MG tablet Take 4 mg by mouth 3 (three) times daily as needed for muscle spasms.    [provider]  topiramate (TOPAMAX) 100 MG tablet Take 25 mg by mouth daily.     [provider]     Critical care time: 39 minutes  Chesley Mires, MD [place] Pager - [phone] 06/09/2019, 1:41 PM

## 2019-06-09 NOTE — Progress Notes (Signed)
Transferred patient from the ED to ICU without complication.

## 2019-06-09 NOTE — ED Notes (Signed)
Date and time results received: 06/09/19 1049(use smartphrase ".now" to insert current time)  Test:PH 7.172, Pco2 greater than 120. Critical Value:   Pco2 greater than 120.  Name of Provider Notified: Dr. Tomi Bamberger  Orders Received? Or Actions Taken?: see chart

## 2019-06-09 NOTE — Progress Notes (Signed)
*  PRELIMINARY RESULTS* Echocardiogram 2D Echocardiogram has been performed.  Terri Casey 06/09/2019, 4:30 PM

## 2019-06-09 NOTE — ED Notes (Signed)
Date and time results received: 06/09/19   Test: PCO2 Critical Value: 81.3  Name of Provider Notified: Tat  Orders Received? Or Actions Taken?:

## 2019-06-09 NOTE — ED Provider Notes (Signed)
[facility] Provider Note   CSN: [id number] Arrival date & time: 06/09/19  2440   Level 5 caveat: Altered mental status  History Chief Complaint  Patient presents with  . Respiratory Distress    Terri Casey is a 59 y.o. female.  HPI   Patient presented to ED for evaluation of respiratory distress.  Patient has history of COPD.  Apparently the patient was supposed to see her doctor yesterday but was not feeling well so she did not go to the office.  This morning the patient had increasing breathing difficulty and appeared confused and altered.  Family members called EMS.  When EMS arrived the patient was cyanotic.  She also had significant respiratory distress.  Her saturation was in the 70s with 3 L of nasal cannula oxygen.  Patient was started on CPAP.  Reportedly there was some improvement.  During my exam the patient was minimally verbally responsive although breathing spontaneously.  Past Medical History:  Diagnosis Date  . Anxiety   . Asthma   . COPD (chronic obstructive pulmonary disease) ([place])   . Diabetes mellitus without complication ([place])   . Hypertension   . Thyroid disease     Patient Active Problem List   Diagnosis Date Noted  . 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 Pericardial effusion: Per 2 d echo 03/26/2015 03/27/2015  . Acute on chronic respiratory failure ([place]) 03/26/2015  . PNA (pneumonia) 03/26/2015  . Nocturnal hypoxia 03/25/2015  . Acute respiratory failure with hypoxia ([place]) 03/25/2015  . COPD exacerbation ([place]) 03/25/2015  . COPD (chronic obstructive pulmonary disease) ([place]) 03/25/2015  . Anxiety   . Hypothyroidism 03/03/2015  . Vitamin D deficiency 03/03/2015  . Hyperlipidemia 07/08/2007  . CIGARETTE SMOKER 07/08/2007  . Essential hypertension, benign 07/08/2007  . EMPHYSEMA-on home O2 07/08/2007  . Asthma 07/08/2007  . C O P D 07/08/2007  . Sleep apnea 07/08/2007  . WEIGHT GAIN 07/08/2007    Past Surgical  History:  Procedure Laterality Date  . ABDOMINAL HYSTERECTOMY       OB History   No obstetric history on file.     No family history on file.  Social History   Tobacco Use  . Smoking status: Current Every Day Smoker    Packs/day: 0.50  . Smokeless tobacco: Never Used  Substance Use Topics  . Alcohol use: No  . Drug use: No    Home Medications Prior to Admission medications   Medication Sig Start Date End Date Taking? Authorizing Provider  albuterol (PROVENTIL) (2.5 MG/3ML) 0.083% nebulizer solution Inhale 3 mLs into the lungs in the morning, at noon, in the evening, and at bedtime. 09/10/17  Yes [provider]  ascorbic acid (GNP VITAMIN C) 1000 MG tablet Take 1,000 mg by mouth daily.   Yes [provider]  atorvastatin (LIPITOR) 20 MG tablet Take 20 mg by mouth at bedtime.    Yes [provider]  cyclobenzaprine (FLEXERIL) 5 MG tablet Take 5 mg by mouth 3 (three) times daily. 02/23/19  Yes [provider]  escitalopram (LEXAPRO) 10 MG tablet Take 10 mg by mouth daily. 05/08/19  Yes [provider]  famotidine (PEPCID) 20 MG tablet Take 20 mg by mouth 2 (two) times daily. 02/10/19  Yes [provider]  levothyroxine (SYNTHROID, LEVOTHROID) 175 MCG tablet TAKE 1 TABLET BY MOUTH EVERY DAY BEFORE BREAKFAST 08/22/16  Yes Nida, Marella Chimes, MD  loratadine (CLARITIN) 10 MG tablet Take 10 mg by mouth daily.  Yes [provider]  oxyCODONE-acetaminophen (PERCOCET) 10-325 MG per tablet Take 1 tablet by mouth in the morning, at noon, in the evening, and at bedtime.    Yes [provider]  Vitamin D, Ergocalciferol, (DRISDOL) 50000 units CAPS capsule TAKE ONE CAPSULE BY MOUTH ONCE A WEEK 10/01/16  Yes Nida, Marella Chimes, MD  ADVAIR DISKUS 500-50 MCG/DOSE AEPB Inhale 1 puff into the lungs every 12 (twelve) hours. 05/08/19   [provider]  albuterol (PROAIR HFA) 108 (90 BASE) MCG/ACT inhaler Inhale 2 puffs into  the lungs every 6 (six) hours as needed for wheezing.    [provider]  albuterol-ipratropium (COMBIVENT) 18-103 MCG/ACT inhaler Inhale into the lungs every 4 (four) hours.    [provider]  ALPRAZolam Duanne Moron) 1 MG tablet Take 1 mg by mouth 2 (two) times daily.    [provider]  Cholecalciferol (VITAMIN D3) 5000 units CAPS Take 1 capsule (5,000 Units total) by mouth daily. 02/14/16   Cassandria Anger, MD  citalopram (CELEXA) 20 MG tablet Take 20 mg by mouth daily.    [provider]  fluticasone (FLONASE) 50 MCG/ACT nasal spray Place 2 sprays into both nostrils daily. Patient taking differently: Place 1 spray into both nostrils daily.  03/29/15   Eugenie Filler, MD  ipratropium-albuterol (DUONEB) 0.5-2.5 (3) MG/3ML SOLN Take 3 mLs by nebulization every 2 (two) hours as needed. Use 3 times daily x 4 days, then every 2 hours as needed. 03/29/15   Eugenie Filler, MD  meclizine (ANTIVERT) 25 MG tablet Take 25 mg by mouth 3 (three) times daily as needed.    [provider]  OXYGEN Inhale 2.5 L into the lungs at bedtime.    [provider]  senna-docusate (SENOKOT-S) 8.6-50 MG tablet Take 2 tablets by mouth 2 (two) times daily. 03/29/15   Eugenie Filler, MD  STIOLTO RESPIMAT 2.5-2.5 MCG/ACT AERS Inhale 2 puffs into the lungs daily. 05/08/19   [provider]  tiotropium (SPIRIVA) 18 MCG inhalation capsule Place 1 capsule (18 mcg total) into inhaler and inhale daily. 04/02/15   Eugenie Filler, MD  tiZANidine (ZANAFLEX) 4 MG tablet Take 4 mg by mouth 3 (three) times daily as needed for muscle spasms.    [provider]  topiramate (TOPAMAX) 25 MG tablet Take 25 mg by mouth 2 (two) times daily.    [provider]  XARELTO 20 MG TABS tablet Take 20 mg by mouth daily. 05/08/19   [provider]    Allergies    Estrogens, Mustard seed, and Strawberry extract  Review of Systems   Review of Systems    Unable to perform ROS: Mental status change    Physical Exam Updated Vital Signs BP 108/77   Pulse 70   Temp 97.9 F (36.6 C)   Resp (!) 21   Ht 1.753 m (5\' 9" )   Wt 125.6 kg   SpO2 92%   BMI 40.89 kg/m   Physical Exam Vitals and nursing note reviewed.  Constitutional:      Appearance: She is well-developed. She is obese. She is ill-appearing.  HENT:     Head: Normocephalic and atraumatic.     Right Ear: External ear normal.     Left Ear: External ear normal.  Eyes:     General: No scleral icterus.       Right eye: No discharge.        Left eye: No discharge.  Conjunctiva/sclera: Conjunctivae normal.  Neck:     Trachea: No tracheal deviation.  Cardiovascular:     Rate and Rhythm: Normal rate and regular rhythm.  Pulmonary:     Effort: Prolonged expiration present.     Breath sounds: No stridor. Wheezing present. No rales.  Abdominal:     General: Bowel sounds are normal. There is no distension.     Palpations: Abdomen is soft.     Tenderness: There is no abdominal tenderness. There is no guarding or rebound.  Musculoskeletal:        General: No tenderness.     Cervical back: Neck supple.  Skin:    General: Skin is warm and dry.     Findings: No rash.  Neurological:     Cranial Nerves: No cranial nerve deficit (no facial droop, extraocular movements intact, no slurred speech).     Sensory: No sensory deficit.     Motor: No abnormal muscle tone or seizure activity.     Coordination: Coordination normal.     ED Results / Procedures / Treatments   Labs (all labs ordered are listed, but only abnormal results are displayed) Labs Reviewed  BASIC METABOLIC PANEL - Abnormal; Notable for the following components:      Result Value   Chloride 91 (*)    CO2 38 (*)    Glucose, Bld 131 (*)    All other components within normal limits  CBC - Abnormal; Notable for the following components:   HCT 49.8 (*)    MCV 118.9 (*)    MCHC 28.5 (*)    Platelets 142 (*)     nRBC 0.6 (*)    All other components within normal limits  BRAIN NATRIURETIC PEPTIDE - Abnormal; Notable for the following components:   B Natriuretic Peptide 444.0 (*)    All other components within normal limits  BLOOD GAS, ARTERIAL - Abnormal; Notable for the following components:   pH, Arterial 7.172 (*)    pCO2 arterial >120.0 (*)    pO2, Arterial 71.2 (*)    All other components within normal limits  URINALYSIS, ROUTINE W REFLEX MICROSCOPIC - Abnormal; Notable for the following components:   Color, Urine AMBER (*)    APPearance HAZY (*)    Hgb urine dipstick SMALL (*)    Ketones, ur 5 (*)    Protein, ur >=300 (*)    Bacteria, UA RARE (*)    All other components within normal limits  SARS CORONAVIRUS 2 BY RT PCR (HOSPITAL ORDER, [facility])  CULTURE, BLOOD (ROUTINE X 2)  CULTURE, BLOOD (ROUTINE X 2)  URINE CULTURE  LACTIC ACID, PLASMA  BLOOD GAS, ARTERIAL  TROPONIN I (HIGH SENSITIVITY)  TROPONIN I (HIGH SENSITIVITY)    EKG EKG Interpretation  Date/Time:  Tuesday June 09 2019 09:29:56 EDT Ventricular Rate:  90 PR Interval:    QRS Duration: 104 QT Interval:  388 QTC Calculation: 475 R Axis:   141 Text Interpretation: Sinus rhythm Left posterior fascicular block Low voltage, extremity and precordial leads Consider anterior infarct No significant change since last tracing Confirmed by Dorie Rank [phone]) on 06/09/2019 9:39:33 AM   Radiology DG Chest Portable 1 View  Result Date: 06/09/2019 CLINICAL DATA:  Intubation EXAM: PORTABLE CHEST 1 VIEW COMPARISON:  02/07/2019 FINDINGS: Endotracheal tube is 4 cm above the carina. NG tube enters the stomach. Cardiomegaly. Mild vascular congestion. No confluent opacities, effusions or edema. IMPRESSION: Endotracheal tube 4 cm above the carina. Cardiomegaly, vascular  congestion Electronically Signed   By: Rolm Baptise M.D.   On: 06/09/2019 10:41    Procedures .Critical Care Performed by: Dorie Rank,  MD Authorized by: Dorie Rank, MD   Critical care provider statement:    Critical care time (minutes):  45   Critical care was time spent personally by me on the following activities:  Discussions with consultants, evaluation of patient's response to treatment, examination of patient, ordering and performing treatments and interventions, ordering and review of laboratory studies, ordering and review of radiographic studies, pulse oximetry, re-evaluation of patient's condition, obtaining history from patient or surrogate and review of old charts Procedure Name: Intubation Date/Time: 06/09/2019 10:07 AM Performed by: Dorie Rank, MD Pre-anesthesia Checklist: Patient identified, Patient being monitored, Emergency Drugs available, Timeout performed and Suction available Oxygen Delivery Method: Non-rebreather mask Preoxygenation: Pre-oxygenation with 100% oxygen Induction Type: Rapid sequence Ventilation: Mask ventilation without difficulty Laryngoscope Size: Glidescope Grade View: Grade I Tube size: 7.5 mm Number of attempts: 1 Airway Equipment and Method: Video-laryngoscopy Placement Confirmation: ETT inserted through vocal cords under direct vision,  CO2 detector and Breath sounds checked- equal and bilateral Secured at: 24 cm Tube secured with: ETT holder      (including critical care time)  Medications Ordered in ED Medications  midazolam (VERSED) injection 2 mg (2 mg Intravenous Given 06/09/19 1030)  midazolam (VERSED) injection 2 mg (has no administration in time range)  fentaNYL (SUBLIMAZE) bolus via infusion 50 mcg (has no administration in time range)  fentaNYL citrate (PF) (SUBLIMAZE) 2,500 mcg in sodium chloride 0.9 % 250 mL (10 mcg/mL) infusion (75 mcg/hr Intravenous Rate/Dose Change 06/09/19 1132)  sodium chloride 0.9 % bolus 1,000 mL (0 mLs Intravenous Stopped 06/09/19 1140)    Followed by  sodium chloride 0.9 % bolus 1,000 mL (1,000 mLs Intravenous New Bag/Given 06/09/19 1142)     Followed by  0.9 %  sodium chloride infusion (has no administration in time range)  etomidate (AMIDATE) injection (20 mg Intravenous Given 06/09/19 0945)  succinylcholine (ANECTINE) injection (120 mg Intravenous Given 06/09/19 0946)  methylPREDNISolone sodium succinate (SOLU-MEDROL) 125 mg/2 mL injection 125 mg (125 mg Intravenous Given 06/09/19 1106)  fentaNYL (SUBLIMAZE) injection 50 mcg (50 mcg Intravenous Given by Other 06/09/19 1011)  magnesium sulfate IVPB 2 g 50 mL (2 g Intravenous New Bag/Given 06/09/19 1054)  albuterol (PROVENTIL) (2.5 MG/3ML) 0.083% nebulizer solution 5 mg (5 mg Nebulization Given 06/09/19 1101)    ED Course  I have reviewed the triage vital signs and the nursing notes.  Pertinent labs & imaging results that were available during my care of the patient were reviewed by me and considered in my medical decision making (see chart for details).  Clinical Course as of Jun 09 1155  Tue Jun 09, 2019  1006 Patient breathing spontaneously.  Vital signs stable however mental status is severely depressed.  Patient was intubated for airway protection   [JK]  47 Suspect her respiratory difficulty was related to COPD and hypercarbia.  Will assess for CHF , pna, ptx, as well as Covid.   [JK]  1058 BP decreased after some of the sedative medications.  We will proceed with fluid bolus.   [GQ]  6761 Labs notable for severe respiratory acidosis.  Patient is on the ventilator.  This gas was drawn shortly after intubation.  Will need to see if this continues to improve with ventilatory support   [JK]  1156 Blood pressure is now improved to 108/77 after fluid bolus.   [  JK]  1156 Case was discussed with Dr. Halford Chessman earlier.  He will see the patient in the ICU   [JK]    Clinical Course User Index [JK] Dorie Rank, MD   MDM Rules/Calculators/A&P                      Patient presented to the ED for evaluation of acute respiratory distress.  Patient has a history of COPD.  When EMS arrived they  noted to be hypoxic and confused.  She was started on CPAP.  Patient had improvement of her oxygenation but she remained very altered upon arrival.  Patient was intubated without difficulty.  Patient had transient hypotension after intubation and sedative meds.  That has improved with IV fluid bolus.  At this time no signs of sepsis.  No lactic acidosis or leukocytosis.  I suspect her hypotension was related to the sedative medications as well as her respiratory acidosis.  Patient is on the ventilator and I suspect her acidosis should improve.  Plan on repeat ABG.  I have consulted with critical care and the hospitalist service.  Patient will be admitted for further treatment. Final Clinical Impression(s) / ED Diagnoses Final diagnoses:  Acute respiratory failure with hypercapnia ([place])  COPD exacerbation ([facility])      Dorie Rank, MD 06/09/19 1159

## 2019-06-09 NOTE — ED Triage Notes (Signed)
[facility] reports pt from home.  Was called out for AMS and sob.  Reports refused to go to her doctor's appt yesterday because she didn't feel well.  This morning pt's altered mental status was worse and breathing was more labored.  EMS arrived and pt was using accessory muscles to breathe and was cyanotic around lips and gums.  O2 sat on 3liters was 77% and lung sounds diminished.  BP 167/86, hr 117, ST on monitor.  CBG 133.  EMS put pt on cpap pressure 7.5.  Reports pt "perked up."  Denies any pain.  Reports color improved and lung sounds improved.  O2 sat 95% on cpap.  BP 146/86.  EMS started 20g iv in left hand.

## 2019-06-09 NOTE — H&P (Signed)
History and Physical  Terri Casey BJS:[id number] DOB: June 24, 1960 DOA: 06/09/2019   PCP: Patient, No Pcp Per   Patient coming from: Home  Chief Complaint: sob  HPI:  Terri Casey is a 59 y.o. female with medical history of 59 year old female with a history of COPD, chronic respiratory failure on 2.5 L, hypertension, hyperlipidemia, hypothyroidism, morbid obesity, nonobstructive CAD, OSA presenting with 2 to 3 days of shortness of breath that has significantly worsened on 06/08/2019.  The patient was supposed to go to see her primary care provider on 06/08/2019, but did not make it because she was not feeling well.  Patient lives with her son, and family noticed the patient was more somnolent and more short of breath in a.m. on 06/09/2019.  EMS was activated.  Upon EMS arrival, the patient was noted to have oxygen saturation 77% on 3 L.  The patient was placed on CPAP with improvement of her respiratory status.  Her blood pressure remained stable at 146/86 per EMS.  However upon arrival to the emergency department, the patient was more somnolent and minimally responsive.  As result, the patient was intubated for airway protection.  According to the patient's family, she continues to smoke 3 to 4 cigarettes/day.  She does not drink any alcohol or use illicit drugs.  There is been no recent complaints of fevers, chills, nausea, vomiting or diarrhea, hematochezia, dysuria, hematuria.  The patient has chronic pain syndrome and complains of pain all over on a daily basis. In the emergency department, the patient was somnolent.  She was intubated for airway protection.  She was initially hypothermic with a blood pressure of 95.7.  After intubation, the patient became mildly hypotensive with systolic blood pressure in the 80s.  BMP and CBC were largely unremarkable.  ABG was 7.172/> 120/71 on 50%.  Chest x-ray showed cardiomegaly and increased interstitial markings.  The patient was started on  Solu-Medrol.  Assessment/Plan: Acute on chronic respiratory failure with hypoxia and hypercarbia -Secondary to COPD exacerbation -The patient is on 2.5 L nasal cannula at baseline -Patient intubated in the emergency department -Consult pulmonary medicine -Wean ventilator per pulmonary medicine -Pulmonary hygiene -Start enteral feeding  COPD exacerbation -Tracheal aspirate for culture -Start Pulmicort -Start Solu-Medrol -Start duo nebs  Hypothyroidism -Convert Synthroid to IV  Chronic pain syndrome -Patient is on chronic Percocet  Depression/anxiety -Continue Celexa -She is chronically on alprazolam 1 mg twice daily  Morbid obesity -BMI 40.9 -Lifestyle modification  Anticoagulant use -Patient is on chronic rivaroxaban -Family states that she has a "clot in her stomach" -Continue rivaroxaban for now  Thrombocytopenia -serum B12 -folate -TSH -appears to be chronic        Past Medical History:  Diagnosis Date  . Anxiety   . Asthma   . COPD (chronic obstructive pulmonary disease) ([place])   . Diabetes mellitus without complication ([place])   . Hypertension   . Thyroid disease    Past Surgical History:  Procedure Laterality Date  . ABDOMINAL HYSTERECTOMY     Social History:  reports that she has been smoking. She has been smoking about 0.50 packs per day. She has never used smokeless tobacco. She reports that she does not drink alcohol or use drugs.   Family History--unobtainable due to extremis  Allergies  Allergen Reactions  . Estrogens Hives  . Mustard [employer]  . Strawberry Extract Hives     Prior to Admission medications   Medication Sig Start Date End Date  Taking? Authorizing Provider  ADVAIR DISKUS 500-50 MCG/DOSE AEPB Inhale 1 puff into the lungs every 12 (twelve) hours. 05/08/19  Yes [provider]  albuterol (PROVENTIL) (2.5 MG/3ML) 0.083% nebulizer solution Inhale 3 mLs into the lungs in the morning, at noon, in the evening, and  at bedtime. 09/10/17  Yes [provider]  ascorbic acid (GNP VITAMIN C) 1000 MG tablet Take 1,000 mg by mouth daily.   Yes [provider]  atorvastatin (LIPITOR) 20 MG tablet Take 20 mg by mouth at bedtime.    Yes [provider]  cyclobenzaprine (FLEXERIL) 5 MG tablet Take 5 mg by mouth 3 (three) times daily. 02/23/19  Yes [provider]  escitalopram (LEXAPRO) 10 MG tablet Take 10 mg by mouth daily. 05/08/19  Yes [provider]  famotidine (PEPCID) 20 MG tablet Take 20 mg by mouth 2 (two) times daily. 02/10/19  Yes [provider]  levothyroxine (SYNTHROID, LEVOTHROID) 175 MCG tablet TAKE 1 TABLET BY MOUTH EVERY DAY BEFORE BREAKFAST Patient taking differently: Take 350 mcg by mouth daily before breakfast.  08/22/16  Yes Nida, Marella Chimes, MD  loratadine (CLARITIN) 10 MG tablet Take 10 mg by mouth daily.   Yes [provider]  oxyCODONE-acetaminophen (PERCOCET) 10-325 MG per tablet Take 1 tablet by mouth in the morning, at noon, in the evening, and at bedtime.    Yes [provider]  Vitamin D, Ergocalciferol, (DRISDOL) 50000 units CAPS capsule TAKE ONE CAPSULE BY MOUTH ONCE A WEEK 10/01/16  Yes Nida, Marella Chimes, MD  XARELTO 20 MG TABS tablet Take 20 mg by mouth daily. 05/08/19  Yes [provider]  Zinc 50 MG TABS Take 1 tablet by mouth daily.   Yes [provider]  albuterol (PROAIR HFA) 108 (90 BASE) MCG/ACT inhaler Inhale 2 puffs into the lungs every 6 (six) hours as needed for wheezing.    [provider]  albuterol-ipratropium (COMBIVENT) 18-103 MCG/ACT inhaler Inhale into the lungs every 4 (four) hours.    [provider]  ALPRAZolam Duanne Moron) 1 MG tablet Take 1 mg by mouth 2 (two) times daily.    [provider]  Cholecalciferol (VITAMIN D3) 5000 units CAPS Take 1 capsule (5,000 Units total) by mouth daily. 02/14/16   Cassandria Anger, MD  citalopram (CELEXA) 20 MG  tablet Take 20 mg by mouth daily.    [provider]  fluticasone (FLONASE) 50 MCG/ACT nasal spray Place 2 sprays into both nostrils daily. Patient taking differently: Place 1 spray into both nostrils daily.  03/29/15   Eugenie Filler, MD  ipratropium-albuterol (DUONEB) 0.5-2.5 (3) MG/3ML SOLN Take 3 mLs by nebulization every 2 (two) hours as needed. Use 3 times daily x 4 days, then every 2 hours as needed. 03/29/15   Eugenie Filler, MD  meclizine (ANTIVERT) 25 MG tablet Take 25 mg by mouth 3 (three) times daily as needed.    [provider]  OXYGEN Inhale 2.5 L into the lungs at bedtime.    [provider]  senna-docusate (SENOKOT-S) 8.6-50 MG tablet Take 2 tablets by mouth 2 (two) times daily. 03/29/15   Eugenie Filler, MD  STIOLTO RESPIMAT 2.5-2.5 MCG/ACT AERS Inhale 2 puffs into the lungs daily. 05/08/19   [provider]  tiotropium (SPIRIVA) 18 MCG inhalation capsule Place 1 capsule (18 mcg total) into inhaler and inhale daily. 04/02/15   Eugenie Filler, MD  tiZANidine (ZANAFLEX) 4 MG tablet Take 4 mg by mouth 3 (three)  times daily as needed for muscle spasms.    [provider]  topiramate (TOPAMAX) 100 MG tablet Take 25 mg by mouth daily.     [provider]    Review of Systems:  Unobtainable due to extemis  Physical Exam: Vitals:   06/09/19 1100 06/09/19 1130 06/09/19 1145 06/09/19 1200  BP: (!) 89/64 102/67 108/77 104/74  Pulse: 75 70 70 68  Resp: (!) 23 (!) 21 (!) 21 (!) 21  Temp: 97.7 F (36.5 C) 97.9 F (36.6 C) 97.9 F (36.6 C) 97.9 F (36.6 C)  TempSrc:      SpO2: 97% 93% 92% (!) 89%  Weight:      Height:       General:  Intubated and sedated Head/Eye: No conjunctival hemorrhage, no icterus, [place]/AT, No nystagmus ENT:  No icterus,  No thrush, good dentition, no pharyngeal exudate Neck:  No masses, no lymphadenpathy, no bruits CV:  RRR, no rub, no gallop, no S3 Lung:  Bilateral wheeze.  Bilateral  rales Abdomen: soft/NT, +BS, nondistended, no peritoneal signs Ext: No cyanosis, No rashes, No petechiae, No lymphangitis, Nonpitting edema Neuro: CNII-XII intact, strength 4/5 in bilateral upper and lower extremities, no dysmetria  Labs on Admission:  Basic Metabolic Panel: Recent Labs  Lab 06/09/19 0935  NA 143  K 4.4  CL 91*  CO2 38*  GLUCOSE 131*  BUN 10  CREATININE 0.70  CALCIUM 9.2   Liver Function Tests: No results for input(s): AST, ALT, ALKPHOS, BILITOT, PROT, ALBUMIN in the last 168 hours. No results for input(s): LIPASE, AMYLASE in the last 168 hours. No results for input(s): AMMONIA in the last 168 hours. CBC: Recent Labs  Lab 06/09/19 0935  WBC 7.7  HGB 14.2  HCT 49.8*  MCV 118.9*  PLT 142*   Coagulation Profile: No results for input(s): INR, PROTIME in the last 168 hours. Cardiac Enzymes: No results for input(s): CKTOTAL, CKMB, CKMBINDEX, TROPONINI in the last 168 hours. BNP: Invalid input(s): POCBNP CBG: No results for input(s): GLUCAP in the last 168 hours. Urine analysis:    Component Value Date/Time   COLORURINE AMBER (A) 06/09/2019 1028   APPEARANCEUR HAZY (A) 06/09/2019 1028   LABSPEC 1.024 06/09/2019 1028   PHURINE 5.0 06/09/2019 1028   GLUCOSEU NEGATIVE 06/09/2019 1028   HGBUR SMALL (A) 06/09/2019 1028   BILIRUBINUR NEGATIVE 06/09/2019 1028   KETONESUR 5 (A) 06/09/2019 1028   PROTEINUR >=300 (A) 06/09/2019 1028   NITRITE NEGATIVE 06/09/2019 1028   LEUKOCYTESUR NEGATIVE 06/09/2019 1028   Sepsis Labs: @LABRCNTIP (procalcitonin:4,lacticidven:4) ) Recent Results (from the past 240 hour(s))  SARS Coronavirus 2 by RT PCR (hospital order, performed in [place] hospital lab) Nasopharyngeal Nasopharyngeal Swab     Status: None   Collection Time: 06/09/19 10:28 AM   Specimen: Nasopharyngeal Swab  Result Value Ref Range Status   SARS Coronavirus 2 NEGATIVE NEGATIVE Final    Comment: (NOTE) SARS-CoV-2 target nucleic acids are NOT  DETECTED. The SARS-CoV-2 RNA is generally detectable in upper and lower respiratory specimens during the acute phase of infection. The lowest concentration of SARS-CoV-2 viral copies this assay can detect is 250 copies / mL. A negative result does not preclude SARS-CoV-2 infection and should not be used as the sole basis for treatment or other patient management decisions.  A negative result may occur with improper specimen collection / handling, submission of specimen other than nasopharyngeal swab, presence of viral mutation(s) within the areas targeted by this assay, and inadequate number of  viral copies (<250 copies / mL). A negative result must be combined with clinical observations, patient history, and epidemiological information. Fact Sheet for Patients:   StrictlyIdeas.no Fact Sheet for Healthcare Providers: BankingDealers.co.za This test is not yet approved or cleared  by the [place] FDA and has been authorized for detection and/or diagnosis of SARS-CoV-2 by FDA under an Emergency Use Authorization (EUA).  This EUA will remain in effect (meaning this test can be used) for the duration of the COVID-19 declaration under Section 564(b)(1) of the Act, 21 U.S.C. section 360bbb-3(b)(1), unless the authorization is terminated or revoked sooner. Performed at [facility], [address] [place], [place] [address]      Radiological Exams on Admission: DG Chest Portable 1 View  Result Date: 06/09/2019 CLINICAL DATA:  Intubation EXAM: PORTABLE CHEST 1 VIEW COMPARISON:  02/07/2019 FINDINGS: Endotracheal tube is 4 cm above the carina. NG tube enters the stomach. Cardiomegaly. Mild vascular congestion. No confluent opacities, effusions or edema. IMPRESSION: Endotracheal tube 4 cm above the carina. Cardiomegaly, vascular congestion Electronically Signed   By: Rolm Baptise M.D.   On: 06/09/2019 10:41    EKG: Independently reviewed. Sinus,  nonspecific TWI    Time spent:60 minutes Code Status:   FULL Family Communication:  No Family at bedside Disposition Plan: expect 2-3 day hospitalization Consults called: PCCM DVT Prophylaxis: [place] Lovenox  Orson Eva, DO  [facility] Pager [phone]  If 7PM-7AM, please contact night-coverage [email or web address] Password [facility] 06/09/2019, 12:14 PM

## 2019-06-09 NOTE — Progress Notes (Signed)
Initial Nutrition Assessment  DOCUMENTATION CODES:   Morbid obesity  INTERVENTION:  Initiate Vital High Protein @ 35 ml/hr via OGT, advance as 10 ml every 6 hours as tolerated to goal rate 55 ml/hr (1320 ml/day)  -This regimen at goal rate 55 ml/hr will provide 1320 kcal (96% of estimated kcal needs), 116 grams of protein, and 1109 ml free water  Recommend new weight as able to fully assess needs and recent trends.  NUTRITION DIAGNOSIS:   Inadequate oral intake related to inability to eat as evidenced by NPO status.   GOAL:   Provide needs based on ASPEN/SCCM guidelines    MONITOR:   Vent status, Labs, Weight trends, I & O's, TF tolerance  REASON FOR ASSESSMENT:   Consult, Ventilator Enteral/tube feeding initiation and management  ASSESSMENT:  59 year old female with past medical history of COPD, chronic respiratory failure on 2.5 L oxygen, HTN, HLD, hypothyroidism, morbid obesity, nonobstructive CAD, OSA presenting with 2-3 day history of worsening shortness of breath, upon ED arrival pt on CPAP noted more somnolent, minimally responsive and intubated for airway protection.  6/8 - admit; intubated  Patient in ED currently intubated on ventilator support MV: 8.2 L/min Temp (24hrs), Avg:97.7 F (36.5 C), Min:95.7 F (35.4 C), Max:98.4 F (36.9 C) Propofol: none   Current wt 276.32 lb No recent wt history for review, per Care Everywhere pt weighed 324.28 lb on 02/06/19, indicating a 48 lb (14.8%) wt loss in 4 months. Per history pt admit weight (125.6 kg) is the same as patient weight on 02/14/2016. Question the accuracy of weight history, recommend obtaining new weight as able to fully assess needs.   Medications reviewed and include:  IVF: NaCl Fentanyl 50 mcg Versed 2 mg Succinylcholine 120 mg Labs: BNP 444 (H)   NUTRITION - FOCUSED PHYSICAL EXAM: Unable to complete at this time, patient in ED   Diet Order:   Diet Order    None      EDUCATION NEEDS:    Not appropriate for education at this time  Skin:  Skin Assessment: Reviewed RN Assessment  Last BM:  unknown  Height:   Ht Readings from Last 1 Encounters:  06/09/19 5\' 9"  (1.753 m)    Weight:   Wt Readings from Last 1 Encounters:  06/09/19 125.6 kg    BMI:  Body mass index is 40.89 kg/m.  Estimated Nutritional Needs:   Kcal:  1382-1758 (11-14 g/kg)  Protein:  109 (2 g/kg/IBW)  Fluid:  >/= 1.3 L/day   Lajuan Lines, RD, LDN Clinical Nutrition After Hours/Weekend Pager # [phone]

## 2019-06-09 NOTE — ED Notes (Signed)
Pt successfully intubated with 7.5 ett tube at 24 at the lip.

## 2019-06-09 NOTE — ED Notes (Signed)
ED TO INPATIENT HANDOFF REPORT  ED Nurse Name and Phone #:    S Name/Age/Gender Terri Casey 59 y.o. female Room/Bed: APA01/APA01  Code Status   Code Status: Full Code  Home/SNF/Other Home Patient oriented to: self Is this baseline?   Triage Complete: Triage complete  Chief Complaint Acute on chronic respiratory failure with hypoxia and hypercapnia (HCC) [A83.41, J96.22]  Triage Note [facility] reports pt from home.  Was called out for AMS and sob.  Reports refused to go to her doctor's appt yesterday because she didn't feel well.  This morning pt's altered mental status was worse and breathing was more labored.  EMS arrived and pt was using accessory muscles to breathe and was cyanotic around lips and gums.  O2 sat on 3liters was 77% and lung sounds diminished.  BP 167/86, hr 117, ST on monitor.  CBG 133.  EMS put pt on cpap pressure 7.5.  Reports pt "perked up."  Denies any pain.  Reports color improved and lung sounds improved.  O2 sat 95% on cpap.  BP 146/86.  EMS started 20g iv in left hand.      Allergies Allergies  Allergen Reactions   Estrogens Hives   Mustard Seed Hives   Strawberry Extract Hives    Level of Care/Admitting Diagnosis ED Disposition    ED Disposition Condition [facility] Area: [facility] [[id number]]  Level of Care: ICU [6]  Covid Evaluation: Confirmed COVID Negative  Diagnosis: Acute on chronic respiratory failure with hypoxia and hypercapnia [facility]) [[id number]]  Admitting Physician: TAT, DAVID [[id number]]  Attending Physician: TAT, DAVID [[id number]]  Estimated length of stay: past midnight tomorrow  Certification:: I certify this patient will need inpatient services for at least 2 midnights       B Medical/Surgery History Past Medical History:  Diagnosis Date   Anxiety    Asthma    COPD (chronic obstructive pulmonary disease) ([place])    Diabetes mellitus without complication ([place])    Hypertension    Thyroid  disease    Past Surgical History:  Procedure Laterality Date   ABDOMINAL HYSTERECTOMY       A IV Location/Drains/Wounds Patient Lines/Drains/Airways Status   Active Line/Drains/Airways    Name:   Placement date:   Placement time:   Site:   Days:   Peripheral IV 06/09/19 Left Hand   06/09/19    0923    Hand   less than 1   Peripheral IV 06/09/19 Left Antecubital   06/09/19    1055    Antecubital   less than 1   Airway 7.5 mm   06/09/19    0941     less than 1          Intake/Output Last 24 hours  Intake/Output Summary (Last 24 hours) at 06/09/2019 1554 Last data filed at 06/09/2019 1211 Gross per 24 hour  Intake 1050 ml  Output --  Net 1050 ml    Labs/Imaging Results for orders placed or performed during the hospital encounter of 06/09/19 (from the past 48 hour(s))  Basic metabolic panel     Status: Abnormal   Collection Time: 06/09/19  9:35 AM  Result Value Ref Range   Sodium 143 135 - 145 mmol/L   Potassium 4.4 3.5 - 5.1 mmol/L   Chloride 91 (L) 98 - 111 mmol/L   CO2 38 (H) 22 - 32 mmol/L   Glucose, Bld 131 (H) 70 - 99 mg/dL    Comment:  Glucose reference range applies only to samples taken after fasting for at least 8 hours.   BUN 10 6 - 20 mg/dL   Creatinine, Ser 0.70 0.44 - 1.00 mg/dL   Calcium 9.2 8.9 - 10.3 mg/dL   GFR calc non Af Amer >60 >60 mL/min   GFR calc Af Amer >60 >60 mL/min   Anion gap 14 5 - 15    Comment: Performed at [facility], [address], [place], [place] [address]  CBC     Status: Abnormal   Collection Time: 06/09/19  9:35 AM  Result Value Ref Range   WBC 7.7 4.0 - 10.5 K/uL   RBC 4.19 3.87 - 5.11 MIL/uL   Hemoglobin 14.2 12.0 - 15.0 g/dL   HCT 49.8 (H) 36.0 - 46.0 %   MCV 118.9 (H) 80.0 - 100.0 fL   MCH 33.9 26.0 - 34.0 pg   MCHC 28.5 (L) 30.0 - 36.0 g/dL   RDW 13.4 11.5 - 15.5 %   Platelets 142 (L) 150 - 400 K/uL   nRBC 0.6 (H) 0.0 - 0.2 %    Comment: Performed at [facility], [address], [place], [place] [address]  Brain  natriuretic peptide     Status: Abnormal   Collection Time: 06/09/19  9:35 AM  Result Value Ref Range   B Natriuretic Peptide 444.0 (H) 0.0 - 100.0 pg/mL    Comment: Performed at [facility], [address], [place], [place] [address]  Troponin I (High Sensitivity)     Status: None   Collection Time: 06/09/19  9:35 AM  Result Value Ref Range   Troponin I (High Sensitivity) 12 <18 ng/L    Comment: (NOTE) Elevated high sensitivity troponin I (hsTnI) values and significant  changes across serial measurements may suggest ACS but many other  chronic and acute conditions are known to elevate hsTnI results.  Refer to the "Links" section for chest pain algorithms and additional  guidance. Performed at [facility], [address], [place], [place] [address]   Blood culture (routine x 2)     Status: None (Preliminary result)   Collection Time: 06/09/19  9:35 AM   Specimen: BLOOD RIGHT ARM  Result Value Ref Range   Specimen Description BLOOD RIGHT ARM DRAWN BY RN    Special Requests      BOTTLES DRAWN AEROBIC AND ANAEROBIC Blood Culture adequate volume Performed at [facility], [address], [place], [place] [address]    Culture PENDING    Report Status PENDING   Lactic acid, plasma     Status: None   Collection Time: 06/09/19  9:35 AM  Result Value Ref Range   Lactic Acid, Venous 1.4 0.5 - 1.9 mmol/L    Comment: Performed at [facility], [address], [place], [place] [address]  SARS Coronavirus 2 by RT PCR (hospital order, performed in [facility] hospital lab) Nasopharyngeal Nasopharyngeal Swab     Status: None   Collection Time: 06/09/19 10:28 AM   Specimen: Nasopharyngeal Swab  Result Value Ref Range   SARS Coronavirus 2 NEGATIVE NEGATIVE    Comment: (NOTE) SARS-CoV-2 target nucleic acids are NOT DETECTED. The SARS-CoV-2 RNA is generally detectable in upper and lower respiratory specimens during the acute phase of infection. The lowest concentration of SARS-CoV-2 viral copies  this assay can detect is 250 copies / mL. A negative result does not preclude SARS-CoV-2 infection and should not be used as the sole basis for treatment or other patient management decisions.  A negative result may occur with improper specimen collection / handling, submission of specimen other than nasopharyngeal swab, presence of viral mutation(s) within the areas targeted by this assay, and inadequate number of viral copies (<250 copies / mL). A negative result must be combined with clinical observations, patient history, and epidemiological information. Fact Sheet for Patients:   StrictlyIdeas.no Fact Sheet for Healthcare Providers: BankingDealers.co.za This test is not yet approved or cleared  by the [place] FDA and has been authorized for detection and/or diagnosis of SARS-CoV-2 by FDA under an Emergency Use Authorization (EUA).  This EUA will remain in effect (meaning this test can be used) for the duration of the COVID-19 declaration under Section 564(b)(1) of the Act, 21 U.S.C. section 360bbb-3(b)(1), unless the authorization is terminated or revoked sooner. Performed at [facility], [address] [place], [place] [address]   Urinalysis, Routine w reflex microscopic     Status: Abnormal   Collection Time: 06/09/19 10:28 AM  Result Value Ref Range   Color, Urine AMBER (A) YELLOW    Comment: BIOCHEMICALS MAY BE AFFECTED BY COLOR   APPearance HAZY (A) CLEAR   Specific Gravity, Urine 1.024 1.005 - 1.030   pH 5.0 5.0 - 8.0   Glucose, UA NEGATIVE NEGATIVE mg/dL   Hgb urine dipstick SMALL (A) NEGATIVE   Bilirubin Urine NEGATIVE NEGATIVE   Ketones, ur 5 (A) NEGATIVE mg/dL   Protein, ur >=300 (A) NEGATIVE mg/dL   Nitrite NEGATIVE NEGATIVE   Leukocytes,Ua NEGATIVE NEGATIVE   RBC / HPF 6-10 0 - 5 RBC/hpf   WBC, UA 6-10 0 - 5 WBC/hpf   Bacteria, UA RARE (A) NONE SEEN   Squamous Epithelial / LPF 0-5 0 - 5   Mucus PRESENT     Budding Yeast PRESENT    Hyaline Casts, UA PRESENT     Comment: Performed at [facility], [address], [place], [place] [address]  Blood gas, arterial ([facility], [facility], [facility])     Status: Abnormal   Collection Time: 06/09/19 10:30 AM  Result Value Ref Range   FIO2 50.00    pH, Arterial 7.172 (LL) 7.350 - 7.450    Comment: CRITICAL RESULT CALLED TO, READ BACK BY AND VERIFIED WITH: CARDWELL L. AT 1039A ON [id number] BY THOMPSON S.    pCO2 arterial >120.0 (HH) 32.0 - 48.0 mmHg    Comment: CRITICAL RESULT CALLED TO, READ BACK BY AND VERIFIED WITH: CARDWELL L. AT 1039A ON [id number] BY THOMPSON S.    pO2, Arterial 71.2 (L) 83.0 - 108.0 mmHg   Bicarbonate NOT CALCULATED 20.0 - 28.0 mmol/L   O2 Saturation 94.0 %   Patient temperature 35.6    Allens test (pass/fail) PASS PASS    Comment: Performed at [facility], [address] [place], [place] [address]  Blood culture (routine x 2)     Status: None (Preliminary result)   Collection Time: 06/09/19 10:45 AM   Specimen: BLOOD RIGHT HAND  Result Value Ref Range   Specimen Description BLOOD RIGHT HAND    Special Requests      BOTTLES DRAWN AEROBIC AND ANAEROBIC Blood Culture results may not be optimal due to an inadequate volume of blood received in culture bottles Performed at [facility], [address], [place], [place] [address]    Culture PENDING    Report Status PENDING   Blood gas, arterial (at [facility])     Status: Abnormal   Collection Time: 06/09/19 12:25 PM  Result Value Ref Range  FIO2 40.00    pH, Arterial 7.295 (L) 7.350 - 7.450   pCO2 arterial 81.3 (HH) 32.0 - 48.0 mmHg    Comment: CRITICAL RESULT CALLED TO, READ BACK BY AND VERIFIED WITH: Bernardo Brayman RN @ 1242 ON [id number] BY HENDERSON L.    pO2, Arterial 47.7 (L) 83.0 - 108.0 mmHg   Bicarbonate 32.5 (H) 20.0 - 28.0 mmol/L   Acid-Base Excess 11.7 (H) 0.0 - 2.0 mmol/L   O2 Saturation 87.8 %   Patient temperature 36.6    Allens test (pass/fail) PASS PASS    Comment: Performed at [facility], [address], [place], [place] [address]  Troponin I (High Sensitivity)     Status: None   Collection Time: 06/09/19  1:34 PM  Result Value Ref Range   Troponin I (High Sensitivity) 14 <18 ng/L    Comment: (NOTE) Elevated high sensitivity troponin I (hsTnI) values and significant  changes across serial measurements may suggest ACS but many other  chronic and acute conditions are known to elevate hsTnI results.  Refer to the "Links" section for chest pain algorithms and additional  guidance. Performed at [facility], [address] [place], [place] [address]   Magnesium     Status: None   Collection Time: 06/09/19  1:55 PM  Result Value Ref Range   Magnesium 2.3 1.7 - 2.4 mg/dL    Comment: Performed at [facility], [address] [place], [place] [address]  Magnesium     Status: None   Collection Time: 06/09/19  1:55 PM  Result Value Ref Range   Magnesium 2.3 1.7 - 2.4 mg/dL    Comment: Performed at [facility], [address] [place], [place] [address]  Phosphorus     Status: None   Collection Time: 06/09/19  1:55 PM  Result Value Ref Range   Phosphorus 3.2 2.5 - 4.6 mg/dL    Comment: Performed at [facility], [address], [place], [place] [address]  Phosphorus     Status: None   Collection Time: 06/09/19  1:55 PM  Result Value Ref Range   Phosphorus 3.2 2.5 - 4.6 mg/dL    Comment: Performed at [facility], [address] [place], [place] [address]  Procalcitonin - Baseline     Status: None   Collection Time: 06/09/19  1:55 PM  Result Value Ref Range   Procalcitonin <0.10 ng/mL    Comment:        Interpretation: PCT (Procalcitonin) <= 0.5 ng/mL: Systemic infection (sepsis) is not likely. Local bacterial infection is possible. (NOTE)       Sepsis PCT Algorithm           Lower Respiratory Tract                                      Infection PCT Algorithm    ----------------------------     ----------------------------         PCT < 0.25 ng/mL                PCT  < 0.10 ng/mL         Strongly encourage             Strongly discourage   discontinuation of antibiotics    initiation of antibiotics    ----------------------------     -----------------------------       PCT 0.25 - 0.50 ng/mL  PCT 0.10 - 0.25 ng/mL               OR       >80% decrease in PCT            Discourage initiation of                                            antibiotics      Encourage discontinuation           of antibiotics    ----------------------------     -----------------------------         PCT >= 0.50 ng/mL              PCT 0.26 - 0.50 ng/mL               AND        <80% decrease in PCT             Encourage initiation of                                             antibiotics       Encourage continuation           of antibiotics    ----------------------------     -----------------------------        PCT >= 0.50 ng/mL                  PCT > 0.50 ng/mL               AND         increase in PCT                  Strongly encourage                                      initiation of antibiotics    Strongly encourage escalation           of antibiotics                                     -----------------------------                                           PCT <= 0.25 ng/mL                                                 OR                                        > 80% decrease in PCT  Discontinue / Do not initiate                                             antibiotics Performed at [facility], [address], [place], [place] [address]   Vitamin B12     Status: None   Collection Time: 06/09/19  1:55 PM  Result Value Ref Range   Vitamin B-12 188 180 - 914 pg/mL    Comment: (NOTE) This assay is not validated for testing neonatal or myeloproliferative syndrome specimens for Vitamin B12 levels. Performed at [facility], [address], [place], [place] [address]   Folate     Status: Abnormal   Collection Time:  06/09/19  1:55 PM  Result Value Ref Range   Folate 5.2 (L) >5.9 ng/mL    Comment: Performed at [facility], [address], [place], [place] [address]  TSH     Status: Abnormal   Collection Time: 06/09/19  1:55 PM  Result Value Ref Range   TSH 32.331 (H) 0.350 - 4.500 uIU/mL    Comment: Performed by a 3rd Generation assay with a functional sensitivity of <=0.01 uIU/mL. Performed at [facility], [address] [place], [place] [address]    DG Chest Portable 1 View  Result Date: 06/09/2019 CLINICAL DATA:  Intubation EXAM: PORTABLE CHEST 1 VIEW COMPARISON:  02/07/2019 FINDINGS: Endotracheal tube is 4 cm above the carina. NG tube enters the stomach. Cardiomegaly. Mild vascular congestion. No confluent opacities, effusions or edema. IMPRESSION: Endotracheal tube 4 cm above the carina. Cardiomegaly, vascular congestion Electronically Signed   By: Rolm Baptise M.D.   On: 06/09/2019 10:41    Pending Labs Unresulted Labs (From admission, onward)    Start     Ordered   06/10/19 0500  Comprehensive metabolic panel  Tomorrow morning,   R     06/09/19 1333   06/10/19 0500  CBC  Tomorrow morning,   R     06/09/19 1333   06/09/19 1338  Respiratory Panel by PCR  (Respiratory virus panel with precautions)  Once,   R     06/09/19 1337   06/09/19 1333  HIV Antibody (routine testing w rflx)  (HIV Antibody (Routine testing w reflex) panel)  Once,   STAT     06/09/19 1333   06/09/19 1333  Culture, respiratory (non-expectorated)  Once,   R     06/09/19 1333   06/09/19 1333  Hemoglobin A1c  Once,   STAT     06/09/19 1333   06/09/19 1332  Magnesium  (ICU Tube Feeding: PEPuP )  5A & 5P,   R     06/09/19 1331   06/09/19 1332  Phosphorus  (ICU Tube Feeding: PEPuP )  5A & 5P,   R     06/09/19 1331   06/09/19 1006  Urine culture  ONCE - STAT,   STAT     06/09/19 1005          Vitals/Pain Today's Vitals   06/09/19 1445 06/09/19 1500 06/09/19 1515 06/09/19 1530  BP: 92/69 94/74 106/81 106/85  Pulse:  80 79 73 79  Resp: (!) 24 (!) 24 (!) 23 (!) 24  Temp: 99.1 F (37.3 C) 99.1 F (37.3 C) 99.3 F (37.4 C) 98.8 F (37.1 C)  TempSrc:      SpO2: 96% 96% 97% 96%  Weight:  Height:      PainSc:        Isolation Precautions Droplet precaution  Medications Medications  midazolam (VERSED) injection 2 mg (has no administration in time range)  fentaNYL (SUBLIMAZE) bolus via infusion 50 mcg (has no administration in time range)  fentaNYL citrate (PF) (SUBLIMAZE) 2,500 mcg in sodium chloride 0.9 % 250 mL (10 mcg/mL) infusion (150 mcg/hr Intravenous Rate/Dose Change 06/09/19 1245)  ondansetron (ZOFRAN) tablet 4 mg (has no administration in time range)    Or  ondansetron (ZOFRAN) injection 4 mg (has no administration in time range)  methylPREDNISolone sodium succinate (SOLU-MEDROL) 125 mg/2 mL injection 60 mg (has no administration in time range)  budesonide (PULMICORT) nebulizer solution 0.5 mg (has no administration in time range)  ipratropium-albuterol (DUONEB) 0.5-2.5 (3) MG/3ML nebulizer solution 3 mL (3 mLs Nebulization Given 06/09/19 1450)  midazolam (VERSED) 5 MG/5ML injection 5 mg (5 mg Intravenous Not Given 06/09/19 1259)  rivaroxaban (XARELTO) tablet 20 mg (has no administration in time range)  feeding supplement (VITAL HIGH PROTEIN) liquid 1,000 mL (has no administration in time range)  acetaminophen (TYLENOL) tablet 650 mg (has no administration in time range)    Or  acetaminophen (TYLENOL) suppository 650 mg (has no administration in time range)  atorvastatin (LIPITOR) tablet 20 mg (has no administration in time range)  escitalopram (LEXAPRO) tablet 10 mg (has no administration in time range)  pantoprazole sodium (PROTONIX) 40 mg/20 mL oral suspension 40 mg (has no administration in time range)  levothyroxine (SYNTHROID) tablet 175 mcg (has no administration in time range)  sodium chloride 0.9 % bolus 1,000 mL (0 mLs Intravenous Stopped 06/09/19 1140)    Followed by  sodium  chloride 0.9 % bolus 1,000 mL (1,000 mLs Intravenous New Bag/Given 06/09/19 1142)    Followed by  0.9 %  sodium chloride infusion ( Intravenous Rate/Dose Change 06/09/19 1346)  chlorhexidine gluconate (MEDLINE KIT) (PERIDEX) 0.12 % solution 15 mL (has no administration in time range)  MEDLINE mouth rinse (has no administration in time range)  doxycycline (VIBRA-TABS) tablet 100 mg (has no administration in time range)  insulin aspart (novoLOG) injection 0-20 Units (has no administration in time range)  etomidate (AMIDATE) injection (20 mg Intravenous Given 06/09/19 0945)  succinylcholine (ANECTINE) injection (120 mg Intravenous Given 06/09/19 0946)  methylPREDNISolone sodium succinate (SOLU-MEDROL) 125 mg/2 mL injection 125 mg (125 mg Intravenous Given 06/09/19 1106)  midazolam (VERSED) injection 2 mg (2 mg Intravenous Given 06/09/19 1158)  fentaNYL (SUBLIMAZE) injection 50 mcg (50 mcg Intravenous Given by Other 06/09/19 1011)  magnesium sulfate IVPB 2 g 50 mL (0 g Intravenous Stopped 06/09/19 1211)  albuterol (PROVENTIL) (2.5 MG/3ML) 0.083% nebulizer solution 5 mg (5 mg Nebulization Given 06/09/19 1101)    Mobility non-ambulatory High fall risk   Focused Assessments    R Recommendations: See Admitting Provider Note  Report given to:   Additional Notes:

## 2019-06-10 ENCOUNTER — Inpatient Hospital Stay (HOSPITAL_COMMUNITY): Payer: Medicaid Other

## 2019-06-10 LAB — COMPREHENSIVE METABOLIC PANEL
ALT: 11 U/L (ref 0–44)
AST: 20 U/L (ref 15–41)
Albumin: 3.2 g/dL — ABNORMAL LOW (ref 3.5–5.0)
Alkaline Phosphatase: 105 U/L (ref 38–126)
Anion gap: 10 (ref 5–15)
BUN: 24 mg/dL — ABNORMAL HIGH (ref 6–20)
CO2: 34 mmol/L — ABNORMAL HIGH (ref 22–32)
Calcium: 9.2 mg/dL (ref 8.9–10.3)
Chloride: 95 mmol/L — ABNORMAL LOW (ref 98–111)
Creatinine, Ser: 1.35 mg/dL — ABNORMAL HIGH (ref 0.44–1.00)
GFR calc Af Amer: 49 mL/min — ABNORMAL LOW (ref >60)
GFR calc non Af Amer: 43 mL/min — ABNORMAL LOW (ref >60)
Glucose, Bld: 136 mg/dL — ABNORMAL HIGH (ref 70–99)
Potassium: 4.4 mmol/L (ref 3.5–5.1)
Sodium: 139 mmol/L (ref 135–145)
Total Bilirubin: 0.4 mg/dL (ref 0.3–1.2)
Total Protein: 6.3 g/dL — ABNORMAL LOW (ref 6.5–8.1)

## 2019-06-10 LAB — MAGNESIUM
Magnesium: 2.2 mg/dL (ref 1.7–2.4)
Magnesium: 2.2 mg/dL (ref 1.7–2.4)

## 2019-06-10 LAB — CBC
HCT: 40.5 % (ref 36.0–46.0)
Hemoglobin: 12.1 g/dL (ref 12.0–15.0)
MCH: 33.6 pg (ref 26.0–34.0)
MCHC: 29.9 g/dL — ABNORMAL LOW (ref 30.0–36.0)
MCV: 112.5 fL — ABNORMAL HIGH (ref 80.0–100.0)
Platelets: 125 10*3/uL — ABNORMAL LOW (ref 150–400)
RBC: 3.6 MIL/uL — ABNORMAL LOW (ref 3.87–5.11)
RDW: 13.7 % (ref 11.5–15.5)
WBC: 7.8 10*3/uL (ref 4.0–10.5)
nRBC: 0.6 % — ABNORMAL HIGH (ref 0.0–0.2)

## 2019-06-10 LAB — GLUCOSE, CAPILLARY
Glucose-Capillary: 116 mg/dL — ABNORMAL HIGH (ref 70–99)
Glucose-Capillary: 124 mg/dL — ABNORMAL HIGH (ref 70–99)
Glucose-Capillary: 131 mg/dL — ABNORMAL HIGH (ref 70–99)
Glucose-Capillary: 133 mg/dL — ABNORMAL HIGH (ref 70–99)
Glucose-Capillary: 134 mg/dL — ABNORMAL HIGH (ref 70–99)
Glucose-Capillary: 135 mg/dL — ABNORMAL HIGH (ref 70–99)
Glucose-Capillary: 157 mg/dL — ABNORMAL HIGH (ref 70–99)

## 2019-06-10 LAB — PHOSPHORUS
Phosphorus: 1.6 mg/dL — ABNORMAL LOW (ref 2.5–4.6)
Phosphorus: 2.7 mg/dL (ref 2.5–4.6)

## 2019-06-10 MED ORDER — FUROSEMIDE 10 MG/ML IJ SOLN
40.0000 mg | Freq: Once | INTRAMUSCULAR | Status: AC
  Administered 2019-06-10: 40 mg via INTRAVENOUS
  Filled 2019-06-10: qty 4

## 2019-06-10 MED ORDER — PRO-STAT SUGAR FREE PO LIQD
30.0000 mL | Freq: Two times a day (BID) | ORAL | Status: DC
  Administered 2019-06-10 – 2019-06-11 (×3): 30 mL
  Filled 2019-06-10 (×3): qty 30

## 2019-06-10 MED ORDER — METHYLPREDNISOLONE SODIUM SUCC 40 MG IJ SOLR
40.0000 mg | Freq: Two times a day (BID) | INTRAMUSCULAR | Status: DC
  Administered 2019-06-11 – 2019-06-14 (×8): 40 mg via INTRAVENOUS
  Filled 2019-06-10 (×8): qty 1

## 2019-06-10 MED ORDER — VITAL AF 1.2 CAL PO LIQD
1000.0000 mL | ORAL | Status: DC
  Administered 2019-06-10 – 2019-06-11 (×3): 1000 mL

## 2019-06-10 MED ORDER — FENTANYL CITRATE (PF) 2500 MCG/50ML IJ SOLN
INTRAMUSCULAR | Status: AC
  Filled 2019-06-10: qty 100

## 2019-06-10 NOTE — Progress Notes (Addendum)
PROGRESS NOTE   Terri Casey  OEV:[id number] DOB: 27-Jul-1960 DOA: 06/09/2019 PCP: Patient, No Pcp Per   Chief Complaint  Patient presents with   Respiratory Distress    Brief Admission History:  59 year old female with a history of COPD, chronic respiratory failure on 2.5 L, hypertension, hyperlipidemia, hypothyroidism, morbid obesity, nonobstructive CAD, OSA presenting with 2 to 3 days of shortness of breath that has significantly worsened on 06/08/2019.   Assessment & Plan:   Active Problems:   Hyperlipidemia   Acute on chronic respiratory failure with hypoxia and hypercapnia (HCC)   Obesity, Class III, BMI 40-49.9 (morbid obesity) (HCC)   Thrombocytopenia (HCC)   Acute respiratory failure with hypoxia and hypercarbia secondary to severe COPD exacerbation.  Patient is intubated on the ventilator.  Continue IV steroids as ordered.  Enteral feeding started.  Continue bronchodilators and respiratory therapy support.  Appreciate assistance of PCCM. Hypothyroidism continue IV Synthroid. Depression anxiety-she is on Celexa and alprazolam. Morbid obesity-BMI 40.9. Coagulopathy/chronic anticoagulant usage-patient remains on rivaroxaban. Thrombocytopenia-continue to follow closely in critical illness. AKI - resolved   DVT prophylaxis: lovenox  Code Status: Full  Family Communication: telephone update Disposition:   Status is: Inpatient  Remains inpatient appropriate because:Hemodynamically unstable, Persistent severe electrolyte disturbances, IV treatments appropriate due to intensity of illness or inability to take PO and Inpatient level of care appropriate due to severity of illness   Dispo: The patient is from: Home              Anticipated d/c is to: Home              Anticipated d/c date is: > 3 days              Patient currently is not medically stable to d/c.   Consultants:  pccm   Procedures:    Antimicrobials:     Subjective: Pt sedated on vent.    Objective: Vitals:   06/10/19 1427 06/10/19 1500 06/10/19 1611 06/10/19 1635  BP:  132/68    Pulse:  78 74   Resp:      Temp:  99.5 F (37.5 C) 99.3 F (37.4 C)   TempSrc:   Bladder   SpO2: 91% 91% 90% 91%  Weight:      Height:        Intake/Output Summary (Last 24 hours) at 06/10/2019 1704 Last data filed at 06/10/2019 1655 Gross per 24 hour  Intake 3356.74 ml  Output --  Net 3356.74 ml   Filed Weights   06/09/19 0919 06/10/19 0500  Weight: 125.6 kg 134.5 kg    Examination:  General exam: sedated on vent. Appears calm and comfortable  Respiratory system: diffuse wheezes heard. Respiratory effort normal. Cardiovascular system: S1 & S2 heard, RRR. No JVD, murmurs, rubs, gallops or clicks. No pedal edema. Gastrointestinal system: Abdomen is nondistended, soft and nontender. No organomegaly or masses felt. Normal bowel sounds heard. Central nervous system: sedated on vent. No focal neurological deficits. Extremities: Symmetric 5 x 5 power. Skin: No rashes, lesions or ulcers Psychiatry: UTO sedated on vent.   Data Reviewed: I have personally reviewed following labs and imaging studies  CBC: Recent Labs  Lab 06/09/19 0935 06/10/19 0402  WBC 7.7 7.8  HGB 14.2 12.1  HCT 49.8* 40.5  MCV 118.9* 112.5*  PLT 142* 125*    Basic Metabolic Panel: Recent Labs  Lab 06/09/19 0935 06/09/19 1355 06/10/19 0402  NA 143  --  139  K 4.4  --  4.4  CL 91*  --  95*  CO2 38*  --  34*  GLUCOSE 131*  --  136*  BUN 10  --  24*  CREATININE 0.70  --  1.35*  CALCIUM 9.2  --  9.2  MG  --  2.3  2.3 2.2  PHOS  --  3.2  3.2 1.6*    GFR: Estimated Creatinine Clearance: 65.4 mL/min (A) (by C-G formula based on SCr of 1.35 mg/dL (H)).  Liver Function Tests: Recent Labs  Lab 06/10/19 0402  AST 20  ALT 11  ALKPHOS 105  BILITOT 0.4  PROT 6.3*  ALBUMIN 3.2*    CBG: Recent Labs  Lab 06/10/19 0032 06/10/19 0519 06/10/19 0727 06/10/19 1142 06/10/19 1613  GLUCAP 133*  134* 135* 157* 124*    Recent Results (from the past 240 hour(s))  Blood culture (routine x 2)     Status: None (Preliminary result)   Collection Time: 06/09/19  9:35 AM   Specimen: BLOOD RIGHT ARM  Result Value Ref Range Status   Specimen Description BLOOD RIGHT ARM DRAWN BY RN  Final   Special Requests   Final    BOTTLES DRAWN AEROBIC AND ANAEROBIC Blood Culture adequate volume   Culture   Final    NO GROWTH < 24 HOURS Performed at [facility], [address] [place], [place] [address]    Report Status PENDING  Incomplete  SARS Coronavirus 2 by RT PCR (hospital order, performed in [place] hospital lab) Nasopharyngeal Nasopharyngeal Swab     Status: None   Collection Time: 06/09/19 10:28 AM   Specimen: Nasopharyngeal Swab  Result Value Ref Range Status   SARS Coronavirus 2 NEGATIVE NEGATIVE Final    Comment: (NOTE) SARS-CoV-2 target nucleic acids are NOT DETECTED. The SARS-CoV-2 RNA is generally detectable in upper and lower respiratory specimens during the acute phase of infection. The lowest concentration of SARS-CoV-2 viral copies this assay can detect is 250 copies / mL. A negative result does not preclude SARS-CoV-2 infection and should not be used as the sole basis for treatment or other patient management decisions.  A negative result may occur with improper specimen collection / handling, submission of specimen other than nasopharyngeal swab, presence of viral mutation(s) within the areas targeted by this assay, and inadequate number of viral copies (<250 copies / mL). A negative result must be combined with clinical observations, patient history, and epidemiological information. Fact Sheet for Patients:   StrictlyIdeas.no Fact Sheet for Healthcare Providers: BankingDealers.co.za This test is not yet approved or cleared  by the [place] FDA and has been authorized for detection and/or diagnosis of SARS-CoV-2  by FDA under an Emergency Use Authorization (EUA).  This EUA will remain in effect (meaning this test can be used) for the duration of the COVID-19 declaration under Section 564(b)(1) of the Act, 21 U.S.C. section 360bbb-3(b)(1), unless the authorization is terminated or revoked sooner. Performed at [facility], [address], [place], [place] [address]   Urine culture     Status: None (Preliminary result)   Collection Time: 06/09/19 10:29 AM   Specimen: Urine, Catheterized  Result Value Ref Range Status   Specimen Description   Final    URINE, CATHETERIZED Performed at [facility], [address], [place], [place] [address]    Special Requests   Final    NONE Performed at [facility], [address], [place], [place] [address]    Culture   Final    CULTURE REINCUBATED  FOR BETTER GROWTH Performed at [facility], [place] [address], [place], [place] [address]    Report Status PENDING  Incomplete  Blood culture (routine x 2)     Status: None (Preliminary result)   Collection Time: 06/09/19 10:45 AM   Specimen: BLOOD RIGHT HAND  Result Value Ref Range Status   Specimen Description BLOOD RIGHT HAND  Final   Special Requests   Final    BOTTLES DRAWN AEROBIC AND ANAEROBIC Blood Culture results may not be optimal due to an inadequate volume of blood received in culture bottles   Culture   Final    NO GROWTH < 24 HOURS Performed at [facility], [address] [place], [place] [address]    Report Status PENDING  Incomplete  Culture, respiratory (non-expectorated)     Status: None (Preliminary result)   Collection Time: 06/09/19  1:33 PM   Specimen: Tracheal Aspirate; Respiratory  Result Value Ref Range Status   Specimen Description   Final    TRACHEAL ASPIRATE Performed at [facility], [address], [place], [place] [address]    Special Requests   Final    NONE Performed at [facility], [address], [place], [place] [address]    Gram Stain   Final    NO WBC  SEEN ABUNDANT GRAM NEGATIVE RODS RARE GRAM POSITIVE COCCI    Culture   Final    TOO YOUNG TO READ Performed at [facility], [place] [address] [place], [place] [address]    Report Status PENDING  Incomplete  MRSA PCR Screening     Status: None   Collection Time: 06/09/19  5:36 PM   Specimen: Nasal Mucosa; Nasopharyngeal  Result Value Ref Range Status   MRSA by PCR NEGATIVE NEGATIVE Final    Comment:        The GeneXpert MRSA Assay (FDA approved for NASAL specimens only), is one component of a comprehensive MRSA colonization surveillance program. It is not intended to diagnose MRSA infection nor to guide or monitor treatment for MRSA infections. Performed at [facility], [address] [place],  [address]      Radiology Studies: [facility] Chest [facility] 1 View  Result Date: 06/10/2019 CLINICAL DATA:  Respiratory failure, intubation EXAM: PORTABLE CHEST 1 VIEW COMPARISON:  Radiograph 06/09/2019, CT 02/03/2019 FINDINGS: Endotracheal tube in the mid trachea, 5 cm from the carina. Transesophageal tube tip below the level of imaging, beyond the GE junction. Telemetry leads overlie the chest. Some improving lung volumes with diminished opacities in the lung bases likely improving atelectasis. Persistent mild central vascular congestion without features of frank edema. No visible effusion the portions of the costophrenic sulci are collimated from view. No pneumothoraces. No acute osseous or soft tissue abnormality. IMPRESSION: 1. Improving lung volumes with diminished opacities in the lung bases, likely improving atelectasis. 2. Persistent mild central vascular congestion. 3. Support devices as above. Electronically Signed   By: Lovena Le M.D.   On: 06/10/2019 06:15   DG Chest Portable 1 View  Result Date: 06/09/2019 CLINICAL DATA:  Intubation EXAM: PORTABLE CHEST 1 VIEW COMPARISON:  02/07/2019 FINDINGS: Endotracheal tube is 4 cm above the carina. NG tube enters the stomach.  Cardiomegaly. Mild vascular congestion. No confluent opacities, effusions or edema. IMPRESSION: Endotracheal tube 4 cm above the carina. Cardiomegaly, vascular congestion Electronically Signed   By: Rolm Baptise M.D.   On: 06/09/2019 10:41   ECHOCARDIOGRAM COMPLETE  Result Date: 06/09/2019    ECHOCARDIOGRAM REPORT  Patient Name:   Terri Casey Date of Exam: 06/09/2019 Medical Rec #:  [id number]         Height:       69.0 in Accession #:    [id number]        Weight:       276.9 lb Date of Birth:  1960/01/24        BSA:          2.372 m Patient Age:    67 years          BP:           106/81 mmHg Patient Gender: F                 HR:           90 bpm. Exam Location:  [facility] Procedure: 2D Echo, Cardiac Doppler and Color Doppler Indications:    Dyspnea 786.09 / R06.00  History:        Patient has prior history of Echocardiogram examinations, most                 recent 03/26/2015. COPD; Risk Factors:Dyslipidemia, Current                 Smoker, Diabetes and Hypertension. Obesity, Class III, BMI                 40-49.9 (morbid obesity).  Sonographer:    Alvino Chapel RCS Referring Phys: [phone] DAVID TAT  Sonographer Comments: Technically difficult study due to poor echo windows. Patient on mechanical ventilator during echo. IMPRESSIONS  1. Left ventricular ejection fraction, by estimation, is 50 to 55%. The left ventricle has low normal function. The left ventricle demonstrates global hypokinesis. There is mild left ventricular hypertrophy. Left ventricular diastolic parameters are indeterminate.  2. Right ventricular systolic function is severely reduced. The right ventricular size is mildly enlarged. Mildly increased right ventricular wall thickness. Tricuspid regurgitation signal is inadequate for assessing PA pressure.  3. Right atrial size was upper normal.  4. The mitral valve is grossly normal with mild annular calcification. Trivial mitral valve regurgitation.  5. The aortic valve was not well visualized.  Aortic valve regurgitation is not visualized. FINDINGS  Left Ventricle: Left ventricular ejection fraction, by estimation, is 50 to 55%. The left ventricle has low normal function. The left ventricle demonstrates global hypokinesis. Definity contrast agent was given IV to delineate the left ventricular endocardial borders. The left ventricular internal cavity size was normal in size. There is mild left ventricular hypertrophy. Left ventricular diastolic parameters are indeterminate. Right Ventricle: The right ventricular size is mildly enlarged. Mildly increased right ventricular wall thickness. Right ventricular systolic function is severely reduced. Tricuspid regurgitation signal is inadequate for assessing PA pressure. Left Atrium: Left atrial size was normal in size. Right Atrium: Right atrial size was upper normal. Pericardium: A small pericardial effusion is present. The pericardial effusion is anterior to the right ventricle. Mitral Valve: The mitral valve is grossly normal. Mild mitral annular calcification. Trivial mitral valve regurgitation. Tricuspid Valve: The tricuspid valve is grossly normal. Tricuspid valve regurgitation is trivial. Aortic Valve: The aortic valve was not well visualized. Aortic valve regurgitation is not visualized. Pulmonic Valve: The pulmonic valve was not well visualized. Pulmonic valve regurgitation is not visualized. Aorta: The aortic root is normal in size and structure. Venous: IVC assessment for right atrial pressure unable to be performed due to mechanical ventilation. IAS/Shunts: No atrial level shunt detected by color  flow Doppler.  LEFT VENTRICLE PLAX 2D LVIDd:         4.56 cm  Diastology LVIDs:         3.56 cm  LV e' lateral:   6.31 cm/s LV PW:         1.14 cm  LV E/e' lateral: 8.7 LV IVS:        1.15 cm  LV e' medial:    9.46 cm/s LVOT diam:     2.10 cm  LV E/e' medial:  5.8 LV SV:         41 LV SV Index:   17 LVOT Area:     3.46 cm  RIGHT VENTRICLE RV S prime:     6.42  cm/s TAPSE (M-mode): 1.2 cm LEFT ATRIUM             Index       RIGHT ATRIUM           Index LA Vol (A2C):   48.9 ml 20.61 ml/m RA Area:     18.40 cm LA Vol (A4C):   27.9 ml 11.76 ml/m RA Volume:   57.00 ml  24.03 ml/m LA Biplane Vol: 37.9 ml 15.98 ml/m  AORTIC VALVE LVOT Vmax:   70.00 cm/s LVOT Vmean:  51.200 cm/s LVOT VTI:    0.119 m MITRAL VALVE MV Area (PHT): 2.87 cm    SHUNTS MV Decel Time: 265 msec    Systemic VTI:  0.12 m MV E velocity: 54.70 cm/s  Systemic Diam: 2.10 cm MV A velocity: 35.30 cm/s MV E/A ratio:  1.55 Rozann Lesches MD Electronically signed by Rozann Lesches MD Signature Date/Time: 06/09/2019/4:40:55 PM    Final      Scheduled Meds:  atorvastatin  20 mg Per Tube QHS   budesonide (PULMICORT) nebulizer solution  0.5 mg Nebulization BID   chlorhexidine gluconate (MEDLINE KIT)  15 mL Mouth Rinse BID   Chlorhexidine Gluconate Cloth  6 each Topical Q0600   doxycycline  100 mg Per Tube Q12H   escitalopram  10 mg Per Tube Daily   feeding supplement (PRO-STAT SUGAR FREE 64)  30 mL Per Tube BID   insulin aspart  0-20 Units Subcutaneous Q4H   ipratropium-albuterol  3 mL Nebulization Q6H   levothyroxine  175 mcg Per Tube Q0600   mouth rinse  15 mL Mouth Rinse 10 times per day   [START ON 06/11/2019] methylPREDNISolone (SOLU-MEDROL) injection  40 mg Intravenous Q12H   pantoprazole sodium  40 mg Per Tube Q24H   rivaroxaban  20 mg Oral Daily   Continuous Infusions:  sodium chloride 75 mL/hr at 06/10/19 1655   feeding supplement (VITAL AF 1.2 CAL) 1,000 mL (06/10/19 1311)   fentaNYL infusion INTRAVENOUS 350 mcg/hr (06/10/19 1655)     LOS: 1 day   Critical Care Procedure Note Authorized and Performed by: Murvin Natal MD  Total Critical Care time:  40 mins  Due to a high probability of clinically significant, life threatening deterioration, the patient required my highest level of preparedness to intervene emergently and I personally spent this critical care time directly and  personally managing the patient.  This critical care time included obtaining a history; examining the patient, pulse oximetry; ordering and review of studies; arranging urgent treatment with development of a management plan; evaluation of patient's response of treatment; frequent reassessment; and discussions with other providers.  This critical care time was performed to assess and manage the high probability of imminent and life threatening deterioration  that could result in multi-organ failure.  It was exclusive of separately billable procedures and treating other patients and teaching time.     Irwin Brakeman, MD How to contact the [facility] Attending or Consulting provider [place] or covering provider during after hours [place], for this patient?  Check the care team in [facility] and look for a) attending/consulting TRH provider listed and b) the [facility] team listed Log into [email or web address] and use [place]'s universal password to access. If you do not have the password, please contact the hospital operator. Locate the [facility] provider you are looking for under [facility] and page to a number that you can be directly reached. If you still have difficulty reaching the provider, please page the [facility] (Director on Call) for the Hospitalists listed on amion for assistance.  06/10/2019, 5:04 PM   '

## 2019-06-10 NOTE — Progress Notes (Signed)
NAME:  Terri Casey, MRN:  [id number], DOB:  Terri 08, 1962, LOS: 1 ADMISSION DATE:  06/09/2019, CONSULTATION DATE:  06/09/2019 REFERRING MD:  Dr. Carles Collet, [facility], CHIEF COMPLAINT:  Short of breath   Brief History   59 yo female smoker brought to ER with dyspnea and altered mental status from acute on chronic hypoxic/hypercapnic respiratory failure from COPD exacerbation.  She required intubation in ER.  Past Medical History  COPD on 2.5 liters, HTN, HLD, Hypothyroidism, CAD, OSA, A fib  Significant Hospital Events   6/08 Admit  Consults:    Procedures:  ETT 6/08 >>   Significant Diagnostic Tests:  Echo 6/08 >> EF 50 to 55%, global hypokinesis, mild LVH, severe RV systolic dysfunction  Micro Data:  SARS CoV2 PCR 6/08 >> negative Blood 6/08 >>  Sputum 6/08 >> Respiratory viral panel 6/08 >>   Antimicrobials:  Doxycycline 6/08 >>   Interim history/subjective:  Remains on sedation, full vent support.  Objective   Blood pressure 115/72, pulse 77, temperature 99.7 F (37.6 C), resp. rate 13, height 5\' 9"  (1.753 m), weight 134.5 kg, SpO2 94 %.    Vent Mode: PRVC FiO2 (%):  [40 %-60 %] 60 % Set Rate:  [24 bmp] 24 bmp Vt Set:  [530 mL-550 mL] 530 mL PEEP:  [5 cmH20] 5 cmH20 Plateau Pressure:  [22 IHW38-88 cmH20] 22 cmH20   Intake/Output Summary (Last 24 hours) at 06/10/2019 1354 Last data filed at 06/10/2019 1304 Gross per 24 hour  Intake 2867.71 ml  Output --  Net 2867.71 ml   Filed Weights   06/09/19 0919 06/10/19 0500  Weight: 125.6 kg 134.5 kg    Examination:  General - sedated Eyes - pupils reactive ENT - ETT in place Cardiac - regular rate/rhythm, no murmur Chest - decreased BS, no wheeze Abdomen - soft, non tender, + bowel sounds Extremities - 1+ edema Skin - no rashes Neuro - RASS -2   Resolved Hospital Problem list   Hypotension from sedation  Assessment & Plan:   Acute on chronic hypoxic/hypercapnic respiratory failure from COPD exacerbation, sleep  disordered breathing, and on going tobacco abuse. - full vent support - goal SpO2 88 to 95% - f/u CXR - scheduled pulmicort, duoneb - change solumedrol to 40 mg bid - day 2 of doxycyline - lasix 40 mg IV x one on 6/09 - will need med reconciliation prior to discharge to determine which inhalers she has been using at home  Acute metabolic encephalopathy from hypercapnia. Hx of chronic pain, depression. - RASS goal 0 to -1 - fentanyl gtt with prn versed  Steroid induced hyperglycemia. - SSI  Hx of HTN, HLD, a fib present prior to admission. - continue lipitor, xarelto  Hx of hypothyroidism. - TSH 32.331 from 06/09/19 - management per primary team  Best practice:  Diet: tube feeds DVT prophylaxis: xarelto GI prophylaxis: protonix Mobility: bed rest Code Status: full code  Labs:   CMP Latest Ref Rng & Units 06/10/2019 06/09/2019 03/29/2015  Glucose 70 - 99 mg/dL 136(H) 131(H) 142(H)  BUN 6 - 20 mg/dL 24(H) 10 27(H)  Creatinine 0.44 - 1.00 mg/dL 1.35(H) 0.70 0.91  Sodium 135 - 145 mmol/L 139 143 140  Potassium 3.5 - 5.1 mmol/L 4.4 4.4 4.4  Chloride 98 - 111 mmol/L 95(L) 91(L) 101  CO2 22 - 32 mmol/L 34(H) 38(H) 30  Calcium 8.9 - 10.3 mg/dL 9.2 9.2 8.9  Total Protein 6.5 - 8.1 g/dL 6.3(L) - -  Total Bilirubin 0.3 -  1.2 mg/dL 0.4 - -  Alkaline Phos 38 - 126 U/L 105 - -  AST 15 - 41 U/L 20 - -  ALT 0 - 44 U/L 11 - -    CBC Latest Ref Rng & Units 06/10/2019 06/09/2019 03/29/2015  WBC 4.0 - 10.5 K/uL 7.8 7.7 10.7(H)  Hemoglobin 12.0 - 15.0 g/dL 12.1 14.2 12.7  Hematocrit 36.0 - 46.0 % 40.5 49.8(H) 41.5  Platelets 150 - 400 K/uL 125(L) 142(L) 276    ABG    Component Value Date/Time   PHART 7.295 (L) 06/09/2019 1225   PCO2ART 81.3 (HH) 06/09/2019 1225   PO2ART 47.7 (L) 06/09/2019 1225   HCO3 32.5 (H) 06/09/2019 1225   TCO2 23.8 09/26/2012 2240   O2SAT 87.8 06/09/2019 1225    CBG (last 3)  Recent Labs    06/10/19 0519 06/10/19 0727 06/10/19 1142  GLUCAP 134* 135*  157*    Critical care time: 35 minutes  Chesley Mires, MD [place] Pager - [phone] 06/10/2019, 1:54 PM

## 2019-06-10 NOTE — Progress Notes (Signed)
Nutrition Follow-up  DOCUMENTATION CODES:   Morbid obesity  INTERVENTION:  Discontinue Vital High Protein  Vital 1.2 @ 30 ml/hr, advance 10 ml every 6 hrs to goal rate 60 ml/hr (1440 ml/day) with 30 ml Prostat via tube twice daily -This regimen provides 1928 kcal (102% of estimated needs), 138 grams protein, 1166 ml free water daily  Continue to monitor refeeding labs  NUTRITION DIAGNOSIS:   Inadequate oral intake related to inability to eat as evidenced by NPO status. Ongoing   GOAL:   Provide needs based on ASPEN/SCCM guidelines   MONITOR:   Vent status, Labs, Weight trends, I & O's, TF tolerance  REASON FOR ASSESSMENT:   Consult, Ventilator Enteral/tube feeding initiation and management  ASSESSMENT:   59 year old female with past medical history of COPD, chronic respiratory failure on 2.5 L oxygen, HTN, HLD, hypothyroidism, morbid obesity, nonobstructive CAD, OSA presenting with 2-3 day history of worsening shortness of breath, upon ED arrival pt on CPAP noted more somnolent, minimally responsive and intubated for airway protection.  New weights obtained, estimated needs adjusted.  I/Os: +2850 ml since admit Patient is currently intubated on ventilator support MV: 12.7 L/min Temp (24hrs), Avg:99.5 F (37.5 C), Min:97.9 F (36.6 C), Max:100.8 F (38.2 C)  Propofol: none  Medications reviewed and include: Doxyclycline, SSI, Methylprednisolone IVF: NaCl Fentanyl 175 mcg  Labs: CBGs 157,135,134,133,107 x 24 hrs, BUN 24 (H), Cr 1.35 (H), P 1.6 (L) trending down, Mg 2.2 (WNL) trending down K 4.4 (WNL)   Diet Order:   Diet Order            Diet NPO time specified  Diet effective now              EDUCATION NEEDS:   Not appropriate for education at this time  Skin:  Skin Assessment: Reviewed RN Assessment  Last BM:  unknown  Height:   Ht Readings from Last 1 Encounters:  06/10/19 5\' 9"  (1.753 m)    Weight:   Wt Readings from Last 1  Encounters:  06/10/19 134.5 kg    BMI:  Body mass index is 43.79 kg/m.  Estimated Nutritional Needs:   Kcal:  5625-6389 (11-14 g/kg)  Protein:  132  Fluid:  >/= 1.4 L/day    Lajuan Lines, RD, LDN Clinical Nutrition After Hours/Weekend Pager # in [place]

## 2019-06-10 NOTE — Progress Notes (Signed)
RT called per patient sats dropping. Patient sats were 87 upon arrival to room. Sat goals per order is 88-95%. FIO2 increased to 70%. Sats now 26. Will continue to monitor.

## 2019-06-11 ENCOUNTER — Inpatient Hospital Stay (HOSPITAL_COMMUNITY): Payer: Medicaid Other

## 2019-06-11 ENCOUNTER — Encounter (HOSPITAL_COMMUNITY): Payer: Self-pay | Admitting: Internal Medicine

## 2019-06-11 LAB — GLUCOSE, CAPILLARY
Glucose-Capillary: 114 mg/dL — ABNORMAL HIGH (ref 70–99)
Glucose-Capillary: 118 mg/dL — ABNORMAL HIGH (ref 70–99)
Glucose-Capillary: 128 mg/dL — ABNORMAL HIGH (ref 70–99)
Glucose-Capillary: 131 mg/dL — ABNORMAL HIGH (ref 70–99)
Glucose-Capillary: 143 mg/dL — ABNORMAL HIGH (ref 70–99)
Glucose-Capillary: 145 mg/dL — ABNORMAL HIGH (ref 70–99)

## 2019-06-11 LAB — CBC WITH DIFFERENTIAL/PLATELET
Abs Immature Granulocytes: 0.08 10*3/uL — ABNORMAL HIGH (ref 0.00–0.07)
Basophils Absolute: 0 10*3/uL (ref 0.0–0.1)
Basophils Relative: 0 %
Eosinophils Absolute: 0 10*3/uL (ref 0.0–0.5)
Eosinophils Relative: 0 %
HCT: 39.3 % (ref 36.0–46.0)
Hemoglobin: 11.8 g/dL — ABNORMAL LOW (ref 12.0–15.0)
Immature Granulocytes: 1 %
Lymphocytes Relative: 8 %
Lymphs Abs: 0.8 10*3/uL (ref 0.7–4.0)
MCH: 32.8 pg (ref 26.0–34.0)
MCHC: 30 g/dL (ref 30.0–36.0)
MCV: 109.2 fL — ABNORMAL HIGH (ref 80.0–100.0)
Monocytes Absolute: 0.7 10*3/uL (ref 0.1–1.0)
Monocytes Relative: 6 %
Neutro Abs: 8.8 10*3/uL — ABNORMAL HIGH (ref 1.7–7.7)
Neutrophils Relative %: 85 %
Platelets: 119 10*3/uL — ABNORMAL LOW (ref 150–400)
RBC: 3.6 MIL/uL — ABNORMAL LOW (ref 3.87–5.11)
RDW: 14.3 % (ref 11.5–15.5)
WBC: 10.4 10*3/uL (ref 4.0–10.5)
nRBC: 0 % (ref 0.0–0.2)

## 2019-06-11 LAB — BRAIN NATRIURETIC PEPTIDE: B Natriuretic Peptide: 56.8 pg/mL (ref 0.0–100.0)

## 2019-06-11 LAB — POCT I-STAT 7, (LYTES, BLD GAS, ICA,H+H)
Acid-Base Excess: 12 mmol/L — ABNORMAL HIGH (ref 0.0–2.0)
Acid-Base Excess: 12 mmol/L — ABNORMAL HIGH (ref 0.0–2.0)
Bicarbonate: 37.1 mmol/L — ABNORMAL HIGH (ref 20.0–28.0)
Bicarbonate: 39.1 mmol/L — ABNORMAL HIGH (ref 20.0–28.0)
Calcium, Ion: 1.18 mmol/L (ref 1.15–1.40)
Calcium, Ion: 1.22 mmol/L (ref 1.15–1.40)
HCT: 39 % (ref 36.0–46.0)
HCT: 39 % (ref 36.0–46.0)
Hemoglobin: 13.3 g/dL (ref 12.0–15.0)
Hemoglobin: 13.3 g/dL (ref 12.0–15.0)
O2 Saturation: 86 %
O2 Saturation: 99 %
Potassium: 4.3 mmol/L (ref 3.5–5.1)
Potassium: 4.3 mmol/L (ref 3.5–5.1)
Sodium: 137 mmol/L (ref 135–145)
Sodium: 138 mmol/L (ref 135–145)
TCO2: 38 mmol/L — ABNORMAL HIGH (ref 22–32)
TCO2: 41 mmol/L — ABNORMAL HIGH (ref 22–32)
pCO2 arterial: 47.2 mmHg (ref 32.0–48.0)
pCO2 arterial: 57.8 mmHg — ABNORMAL HIGH (ref 32.0–48.0)
pH, Arterial: 7.438 (ref 7.350–7.450)
pH, Arterial: 7.503 — ABNORMAL HIGH (ref 7.350–7.450)
pO2, Arterial: 117 mmHg — ABNORMAL HIGH (ref 83.0–108.0)
pO2, Arterial: 48 mmHg — ABNORMAL LOW (ref 83.0–108.0)

## 2019-06-11 LAB — COMPREHENSIVE METABOLIC PANEL
ALT: 9 U/L (ref 0–44)
AST: 23 U/L (ref 15–41)
Albumin: 3.1 g/dL — ABNORMAL LOW (ref 3.5–5.0)
Alkaline Phosphatase: 86 U/L (ref 38–126)
Anion gap: 7 (ref 5–15)
BUN: 42 mg/dL — ABNORMAL HIGH (ref 6–20)
CO2: 35 mmol/L — ABNORMAL HIGH (ref 22–32)
Calcium: 8.8 mg/dL — ABNORMAL LOW (ref 8.9–10.3)
Chloride: 97 mmol/L — ABNORMAL LOW (ref 98–111)
Creatinine, Ser: 1 mg/dL (ref 0.44–1.00)
GFR calc Af Amer: >60 mL/min (ref >60)
GFR calc non Af Amer: >60 mL/min (ref >60)
Glucose, Bld: 121 mg/dL — ABNORMAL HIGH (ref 70–99)
Potassium: 4.6 mmol/L (ref 3.5–5.1)
Sodium: 139 mmol/L (ref 135–145)
Total Bilirubin: 0.4 mg/dL (ref 0.3–1.2)
Total Protein: 6 g/dL — ABNORMAL LOW (ref 6.5–8.1)

## 2019-06-11 LAB — BLOOD GAS, ARTERIAL
Acid-Base Excess: 10.9 mmol/L — ABNORMAL HIGH (ref 0.0–2.0)
Bicarbonate: 33.5 mmol/L — ABNORMAL HIGH (ref 20.0–28.0)
Drawn by: 41977
FIO2: 70
MECHVT: 530 mL
O2 Saturation: 91.9 %
PEEP: 5 cmH2O
Patient temperature: 37.4
RATE: 20 resp/min
pCO2 arterial: 57.2 mmHg — ABNORMAL HIGH (ref 32.0–48.0)
pH, Arterial: 7.414 (ref 7.350–7.450)
pO2, Arterial: 62.5 mmHg — ABNORMAL LOW (ref 83.0–108.0)

## 2019-06-11 MED ORDER — FUROSEMIDE 10 MG/ML IJ SOLN
40.0000 mg | Freq: Once | INTRAMUSCULAR | Status: AC
  Administered 2019-06-11: 40 mg via INTRAVENOUS
  Filled 2019-06-11: qty 4

## 2019-06-11 MED ORDER — POLYETHYLENE GLYCOL 3350 17 G PO PACK: 17.0000 g | PACK | Freq: Every day | ORAL | Status: DC

## 2019-06-11 MED ORDER — ATORVASTATIN CALCIUM 20 MG PO TABS: 20.0000 mg | ORAL_TABLET | Freq: Every day | ORAL | Status: DC

## 2019-06-11 MED ORDER — DEXMEDETOMIDINE HCL IN NACL 400 MCG/100ML IV SOLN
0.0000 ug/kg/h | INTRAVENOUS | Status: DC
  Administered 2019-06-11: 1 ug/kg/h via INTRAVENOUS
  Administered 2019-06-11: 0.4 ug/kg/h via INTRAVENOUS
  Administered 2019-06-11: 1 ug/kg/h via INTRAVENOUS
  Administered 2019-06-12 (×2): 1.2 ug/kg/h via INTRAVENOUS
  Administered 2019-06-12: 1 ug/kg/h via INTRAVENOUS
  Administered 2019-06-13 – 2019-06-14 (×2): 0.4 ug/kg/h via INTRAVENOUS
  Filled 2019-06-11 (×9): qty 100

## 2019-06-11 MED ORDER — ACETAMINOPHEN 650 MG RE SUPP: 650.0000 mg | Freq: Four times a day (QID) | RECTAL | Status: DC | PRN

## 2019-06-11 MED ORDER — LEVOTHYROXINE SODIUM 75 MCG PO TABS: 175.0000 ug | ORAL_TABLET | Freq: Every day | ORAL | Status: DC

## 2019-06-11 MED ORDER — DOCUSATE SODIUM 50 MG/5ML PO LIQD
100.0000 mg | Freq: Two times a day (BID) | ORAL | Status: DC
  Administered 2019-06-11 – 2019-06-13 (×5): 100 mg
  Filled 2019-06-11 (×7): qty 10

## 2019-06-11 MED ORDER — VITAL HIGH PROTEIN PO LIQD
1000.0000 mL | ORAL | Status: DC
  Administered 2019-06-11 (×2): 1000 mL

## 2019-06-11 MED ORDER — SODIUM CHLORIDE 0.9 % IV SOLN: 1000.0000 mL | INTRAVENOUS | Status: DC

## 2019-06-11 MED ORDER — POLYETHYLENE GLYCOL 3350 17 G PO PACK
17.0000 g | PACK | Freq: Every day | ORAL | Status: DC
  Administered 2019-06-11 – 2019-06-13 (×3): 17 g
  Filled 2019-06-11 (×3): qty 1

## 2019-06-11 MED ORDER — DOCUSATE SODIUM 50 MG/5ML PO LIQD: 100.0000 mg | Freq: Two times a day (BID) | ORAL | Status: DC

## 2019-06-11 MED ORDER — SODIUM CHLORIDE 0.9 % IV SOLN
2.0000 g | Freq: Three times a day (TID) | INTRAVENOUS | Status: DC
  Administered 2019-06-11 – 2019-06-15 (×12): 2 g via INTRAVENOUS
  Filled 2019-06-11 (×12): qty 2

## 2019-06-11 MED ORDER — ACETAMINOPHEN 650 MG RE SUPP
650.0000 mg | Freq: Four times a day (QID) | RECTAL | Status: DC | PRN
  Filled 2019-06-11: qty 1

## 2019-06-11 MED ORDER — PRO-STAT SUGAR FREE PO LIQD
30.0000 mL | Freq: Two times a day (BID) | ORAL | Status: DC
  Administered 2019-06-11 – 2019-06-13 (×4): 30 mL
  Filled 2019-06-11 (×4): qty 30

## 2019-06-11 MED ORDER — ACETAMINOPHEN 325 MG PO TABS: 650.0000 mg | ORAL_TABLET | Freq: Four times a day (QID) | ORAL | Status: DC | PRN

## 2019-06-11 MED ORDER — MIDAZOLAM HCL 2 MG/2ML IJ SOLN
2.0000 mg | INTRAMUSCULAR | Status: DC | PRN
  Administered 2019-06-11 – 2019-06-12 (×3): 2 mg via INTRAVENOUS
  Filled 2019-06-11 (×3): qty 2

## 2019-06-11 MED ORDER — FENTANYL CITRATE (PF) 100 MCG/2ML IJ SOLN
50.0000 ug | INTRAMUSCULAR | Status: DC | PRN
  Administered 2019-06-11: 50 ug via INTRAVENOUS
  Administered 2019-06-12 (×2): 200 ug via INTRAVENOUS
  Administered 2019-06-12: 100 ug via INTRAVENOUS
  Administered 2019-06-13: 200 ug via INTRAVENOUS
  Administered 2019-06-13 (×2): 100 ug via INTRAVENOUS
  Administered 2019-06-13: 200 ug via INTRAVENOUS
  Administered 2019-06-13: 100 ug via INTRAVENOUS
  Filled 2019-06-11: qty 2
  Filled 2019-06-11: qty 4
  Filled 2019-06-11: qty 2
  Filled 2019-06-11: qty 4
  Filled 2019-06-11: qty 2
  Filled 2019-06-11: qty 4
  Filled 2019-06-11 (×2): qty 2
  Filled 2019-06-11: qty 4

## 2019-06-11 MED ORDER — IPRATROPIUM-ALBUTEROL 0.5-2.5 (3) MG/3ML IN SOLN
3.0000 mL | RESPIRATORY_TRACT | Status: DC
  Administered 2019-06-11 – 2019-06-12 (×6): 3 mL via RESPIRATORY_TRACT
  Filled 2019-06-11 (×6): qty 3

## 2019-06-11 MED ORDER — ESCITALOPRAM OXALATE 10 MG PO TABS
10.0000 mg | ORAL_TABLET | Freq: Every day | ORAL | Status: DC
  Administered 2019-06-12 – 2019-06-13 (×2): 10 mg
  Filled 2019-06-11 (×2): qty 1

## 2019-06-11 MED ORDER — ATORVASTATIN CALCIUM 10 MG PO TABS
20.0000 mg | ORAL_TABLET | Freq: Every day | ORAL | Status: DC
  Administered 2019-06-11 – 2019-06-12 (×2): 20 mg
  Filled 2019-06-11 (×2): qty 2

## 2019-06-11 MED ORDER — ACETAMINOPHEN 325 MG PO TABS
650.0000 mg | ORAL_TABLET | Freq: Four times a day (QID) | ORAL | Status: DC | PRN
  Administered 2019-06-18: 650 mg
  Filled 2019-06-11: qty 2

## 2019-06-11 MED ORDER — LEVOTHYROXINE SODIUM 25 MCG PO TABS
200.0000 ug | ORAL_TABLET | Freq: Every day | ORAL | Status: DC
  Administered 2019-06-12 – 2019-06-13 (×2): 200 ug
  Filled 2019-06-11 (×2): qty 8

## 2019-06-11 MED ORDER — ESCITALOPRAM OXALATE 10 MG PO TABS: 10.0000 mg | ORAL_TABLET | Freq: Every day | ORAL | Status: DC

## 2019-06-11 NOTE — Progress Notes (Signed)
At 1420HR RT extubated patient per orders. RN at bedside. RT placed patient on 5L O2; however, SATs continued to decline to low 70's and patient became unresponsive, RN bagged with 100% NRB and RT placed on BIPAP briefly, patient was reintubated by DR. Sood at 1440 and placed on previous ventilator settings. RT increased FIO2 to 100% for SATs of 94. RT will continue to monitor and assess.

## 2019-06-11 NOTE — Progress Notes (Signed)
Pharmacy Antibiotic Note  Terri Casey is a 59 y.o. female admitted on 06/09/2019 with pseudomonas tracheal aspirate.  Pharmacy has been consulted for Cefepime dosing.  Plan: Cefepime 2000 mg IV every 8 hours. Monitor labs, c/s, and patient improvement.  Height: 5\' 9"  (175.3 cm) Weight: (!) 139 kg (306 lb 7 oz) IBW/kg (Calculated) : 66.2  Temp (24hrs), Avg:99.5 F (37.5 C), Min:99.1 F (37.3 C), Max:99.7 F (37.6 C)  Recent Labs  Lab 06/09/19 0935 06/10/19 0402 06/11/19 0402  WBC 7.7 7.8 10.4  CREATININE 0.70 1.35* 1.00  LATICACIDVEN 1.4  --   --     Estimated Creatinine Clearance: 90 mL/min (by C-G formula based on SCr of 1 mg/dL).    Allergies  Allergen Reactions  . Estrogens Hives  . Mustard [employer]  . Strawberry Extract Hives    Antimicrobials this admission: Cefepime 6/10 >>  Doxy 6/8 >> 6/10  Microbiology results: 6/8 BCx: ngtd 6/8 UCx: reincubated for better growth  6/8 tracheal aspirate: pseudomonas  6/8 MRSA PCR: negative  Thank you for allowing pharmacy to be a part of this patient's care.  Ramond Craver 06/11/2019 10:16 AM

## 2019-06-11 NOTE — Progress Notes (Signed)
270 mL fentanyl wasted into stericycle with second verify by Sherry Ruffing, RN.

## 2019-06-11 NOTE — Progress Notes (Signed)
eLink Physician-Brief Progress Note Patient Name: Terri Casey DOB: Oct 03, 1960 MRN: [id number]   Date of Service  06/11/2019  HPI/Events of Note  Pt with a history of chronic respiratory failure secondary to COPD,  on 2 liters of home O2  Via nasal cannula admitted via ED for acute on chronic respiratory failure due to acute exacerbation of COPD, Pt is currently intubated and on the ventilator. Pt is on empiric Cefepime, as well as standard COPD Rx.  eICU Interventions  New Patient Evaluation completed.        Kerry Kass [name] 06/11/2019, 7:54 PM

## 2019-06-11 NOTE — Progress Notes (Signed)
Report given to Sam, RN on 21M at [facility]

## 2019-06-11 NOTE — Progress Notes (Signed)
Nutrition Follow-up  DOCUMENTATION CODES:   Morbid obesity  INTERVENTION:  Continue Vital High Protein @ 40 ml/hr with 30 ml prostat BID via tube. -Provides 1160 kcal, 114 grams of protein, and 806 ml free water  Pending transfer to [facility], recommend d/c of VHP and resuming Vital 1.2 at 50 ml, advance 10 ml every 6 hrs to goal rate 60 ml/hr (1440 ml/day) with 30 ml prostat BID via tube -This regimen will provide 1928 kcal, 138 grams protein, and 1166 ml free water  NUTRITION DIAGNOSIS:   Inadequate oral intake related to inability to eat as evidenced by NPO status. Ongoing  GOAL:   Provide needs based on ASPEN/SCCM guidelines   MONITOR:   Vent status, Labs, Weight trends, I & O's, TF tolerance  REASON FOR ASSESSMENT:   Consult Enteral/tube feeding initiation and management  ASSESSMENT:  RD working remotely.  59 year old female with past medical history of COPD, chronic respiratory failure on 2.5 L oxygen, HTN, HLD, hypothyroidism, morbid obesity, nonobstructive CAD, OSA presenting with 2-3 day history of worsening shortness of breath, upon ED arrival pt on CPAP noted more somnolent, minimally responsive and intubated for airway protection.  6/08 - Admit, Intubated  Patient extubated at 51 to 5 L O2, SATs continued to decline, patient became unresponsive requiring reintubation at 1440. Patient will likely need trach, plans to transfer to [facility].  At 0100 pt receiving Vital 1.2 @ 50 ml/hr, advancing towards goal of 60 ml/hr prior to extubation. Tube feed protocol initiated s/p reintubation, at 1537 Vital High Protein running at 40 ml/hr. Given patient is transferring, will continue with protocol and recommend resuming Vital 1.2 upon arrival at [facility].  BP: 152/90 (cuff) MAP: 107 (cuff)  I/O: +4749 ml since admit    +1899 ml x 24 hrs UOP: 1250 ml x 24 hrs Patient is currently intubated on ventilator support MV: 10.8 L/min Temp (24hrs), Avg:99.4 F (37.4 C), Min:99.1 F (37.3 C),  Max:99.7 F (37.6 C)  Propofol: none  Medications reviewed and include: SSI, methylprednisolone NaCl Maxipime Precedex 0.4 mcg Labs: CBGs 114,131,118,116,131   Diet Order:   Diet Order            Diet NPO time specified  Diet effective now                 EDUCATION NEEDS:   Not appropriate for education at this time  Skin:  Skin Assessment: Reviewed RN Assessment  Last BM:  unknown  Height:   Ht Readings from Last 1 Encounters:  06/11/19 5\' 9"  (1.753 m)    Weight:   Wt Readings from Last 1 Encounters:  06/11/19 (!) 139 kg     BMI:  Body mass index is 45.25 kg/m.  Estimated Nutritional Needs:   Kcal:  1224-4975 (11-14 g/kg)  Protein:  132  Fluid:  >/= 1.4 L/day   Lajuan Lines, RD, LDN Clinical Nutrition After Hours/Weekend Pager # in [place]

## 2019-06-11 NOTE — Progress Notes (Signed)
Pt extubated earlier in the day.  Shortly after she had decreased respiratory effort, decreased mental status and hypoxia with SpO2 down into the mid 30's with good wave form on pulse ox.  No improvement with Bipap.  Required re-intubation.  SpO2 improved to mid 90's.  Attempted to contact family at listed number but now answer.  D/w Dr. Wynetta Emery.  She will likely need tracheostomy to assist with vent weaning.  As such she will need to be transferred to [facility].  Chesley Mires, MD [place] Pager - [phone] 06/11/2019, 3:21 PM

## 2019-06-11 NOTE — Progress Notes (Signed)
PROGRESS NOTE   Terri Casey  BXI:[id number] DOB: [birth year] DOA: 06/09/2019 PCP: Patient, No Pcp Per   Chief Complaint  Patient presents with  . Respiratory Distress    Brief Admission History:  59 year old female with a history of COPD, chronic respiratory failure on 2.5 L, hypertension, hyperlipidemia, hypothyroidism, morbid obesity, nonobstructive CAD, OSA presenting with 2 to 3 days of shortness of breath that has significantly worsened on 06/08/2019.   Assessment & Plan:   Active Problems:   Hyperlipidemia   Acute on chronic respiratory failure with hypoxia and hypercapnia (HCC)   Obesity, Class III, BMI 40-49.9 (morbid obesity) (HCC)   Thrombocytopenia ([place])   1. Acute respiratory failure with hypoxia and hypercarbia secondary to severe COPD exacerbation.  Patient is intubated on the ventilator.  Critical care tried to extubate today however extubation failed and patient now reintubated.  CCM requested transfer to [facility] ICU for higher level of care and may need trach placement.  Continue IV steroids as ordered.  Enteral feeding being tolerated  Continue bronchodilators and respiratory therapy support.  Appreciate assistance of PCCM. 2. Hypothyroidism continue IV Synthroid. 3. Depression anxiety-she is on Celexa and alprazolam. 4. Morbid obesity-BMI 40.9. 5. Coagulopathy/chronic anticoagulant usage-patient remains on rivaroxaban. 6. Thrombocytopenia-continue to follow closely in critical illness. 7. AKI - resolved   DVT prophylaxis: lovenox  Code Status: Full  Family Communication: telephone update attempted, phone disconnected, no other contacts known Disposition: transfer to [facility] ICU per Dr. Halford Chessman.   Status is: Inpatient  Remains inpatient appropriate because:Hemodynamically unstable, Persistent severe electrolyte disturbances, IV treatments appropriate due to intensity of illness or inability to take PO and Inpatient level of care appropriate due to severity of illness.   Pt failed trial at extubation.  Critical care MD requests transfer to [facility] ICU for higher level of care, may need trach placement.    Dispo: The patient is from: Home              Anticipated d/c is to: Home              Anticipated d/c date is: > 3 days              Patient currently is not medically stable to d/c.  Consultants:   pccm   Procedures:     Antimicrobials:     Subjective: Pt failed trial to extubate and has been subsequently re-intubated.  Dr. Halford Chessman wants her to go to [facility] as she likely will need trach  Objective: Vitals:   06/11/19 1348 06/11/19 1444 06/11/19 1500 06/11/19 1508  BP:   (!) 152/90   Pulse: 74  100 95  Resp: _0 Temp: 99.5 F (37.5 C)  99.3 F (37.4 C) 99.3 F (37.4 C)  TempSrc:      SpO2: 94%  96% 96%  Weight:      Height:  _1  (1.753 m)      Intake/Output Summary (Last 24 hours) at 06/11/2019 1517 Last data filed at 06/11/2019 1516 Gross per 24 hour  Intake 3827.73 ml  Output 1250 ml  Net 2577.73 ml   Filed Weights   06/09/19 0919 06/10/19 0500 06/11/19 0500  Weight: 125.6 kg 134.5 kg (!) 139 kg    Examination:  General exam: sedated on vent. Appears calm and comfortable  Respiratory system: diffuse wheezes heard. Respiratory effort normal. Cardiovascular system: S1 & S2 heard, RRR. No JVD, murmurs, rubs, gallops or clicks. No pedal edema. Gastrointestinal system: Abdomen is  nondistended, soft and nontender. No organomegaly or masses felt. Normal bowel sounds heard. Central nervous system: sedated on vent. No focal neurological deficits. Extremities: Symmetric 5 x 5 power. Skin: No rashes, lesions or ulcers Psychiatry: UTO sedated on vent.   Data Reviewed: I have personally reviewed following labs and imaging studies  CBC: Recent Labs  Lab 06/09/19 0935 06/10/19 0402 06/11/19 0402  WBC 7.7 7.8 10.4  NEUTROABS  --   --  8.8*  HGB 14.2 12.1 11.8*  HCT 49.8* 40.5 39.3  MCV 118.9* 112.5* 109.2*  PLT 142* 125* 119*      Basic Metabolic Panel: Recent Labs  Lab 06/09/19 0935 06/09/19 1355 06/10/19 0402 06/10/19 1803 06/11/19 0402  NA 143  --  139  --  139  K 4.4  --  4.4  --  4.6  CL 91*  --  95*  --  97*  CO2 38*  --  34*  --  35*  GLUCOSE 131*  --  136*  --  121*  BUN 10  --  24*  --  42*  CREATININE 0.70  --  1.35*  --  1.00  CALCIUM 9.2  --  9.2  --  8.8*  MG  --  2.3  2.3 2.2 2.2  --   PHOS  --  3.2  3.2 1.6* 2.7  --     GFR: Estimated Creatinine Clearance: 90 mL/min (by C-G formula based on SCr of 1 mg/dL).  Liver Function Tests: Recent Labs  Lab 06/10/19 0402 06/11/19 0402  AST 20 23  ALT 11 9  ALKPHOS 105 86  BILITOT 0.4 0.4  PROT 6.3* 6.0*  ALBUMIN 3.2* 3.1*    CBG: Recent Labs  Lab 06/10/19 2005 06/10/19 2339 06/11/19 0426 06/11/19 0735 06/11/19 1136  GLUCAP 131* 116* 118* 131* 114*    Recent Results (from the past 240 hour(s))  Blood culture (routine x 2)     Status: None (Preliminary result)   Collection Time: 06/09/19  9:35 AM   Specimen: BLOOD RIGHT ARM  Result Value Ref Range Status   Specimen Description BLOOD RIGHT ARM DRAWN BY RN  Final   Special Requests   Final    BOTTLES DRAWN AEROBIC AND ANAEROBIC Blood Culture adequate volume   Culture   Final    NO GROWTH 2 DAYS Performed at [facility], [address], [place], [place] [address]    Report Status PENDING  Incomplete  SARS Coronavirus 2 by RT PCR (hospital order, performed in [place] hospital lab) Nasopharyngeal Nasopharyngeal Swab     Status: None   Collection Time: 06/09/19 10:28 AM   Specimen: Nasopharyngeal Swab  Result Value Ref Range Status   SARS Coronavirus 2 NEGATIVE NEGATIVE Final    Comment: (NOTE) SARS-CoV-2 target nucleic acids are NOT DETECTED. The SARS-CoV-2 RNA is generally detectable in upper and lower respiratory specimens during the acute phase of infection. The lowest concentration of SARS-CoV-2 viral copies this assay can detect is 250 copies / mL. A negative  result does not preclude SARS-CoV-2 infection and should not be used as the sole basis for treatment or other patient management decisions.  A negative result may occur with improper specimen collection / handling, submission of specimen other than nasopharyngeal swab, presence of viral mutation(s) within the areas targeted by this assay, and inadequate number of viral copies (<250 copies / mL). A negative result must be combined with clinical observations, patient history, and epidemiological information. Fact Sheet for Patients:  StrictlyIdeas.no Fact Sheet for Healthcare Providers: BankingDealers.co.za This test is not yet approved or cleared  by the [place] FDA and has been authorized for detection and/or diagnosis of SARS-CoV-2 by FDA under an Emergency Use Authorization (EUA).  This EUA will remain in effect (meaning this test can be used) for the duration of the COVID-19 declaration under Section 564(b)(1) of the Act, 21 U.S.C. section 360bbb-3(b)(1), unless the authorization is terminated or revoked sooner. Performed at [facility], [address], [place], [place] [address]   Urine culture     Status: None (Preliminary result)   Collection Time: 06/09/19 10:29 AM   Specimen: Urine, Catheterized  Result Value Ref Range Status   Specimen Description   Final    URINE, CATHETERIZED Performed at [facility], [address] [place], [place] [address]    Special Requests   Final    NONE Performed at [facility], [address] [place], [place] [address]    Culture   Final    CULTURE REINCUBATED FOR BETTER GROWTH Performed at [facility], [place] [address] [place], [place] [address]    Report Status PENDING  Incomplete  Blood culture (routine x 2)     Status: None (Preliminary result)   Collection Time: 06/09/19 10:45 AM   Specimen: BLOOD RIGHT HAND  Result Value Ref Range Status   Specimen Description BLOOD RIGHT  HAND  Final   Special Requests   Final    BOTTLES DRAWN AEROBIC AND ANAEROBIC Blood Culture results may not be optimal due to an inadequate volume of blood received in culture bottles   Culture   Final    NO GROWTH 2 DAYS Performed at [facility], [address] [place], [place] [address]    Report Status PENDING  Incomplete  Culture, respiratory (non-expectorated)     Status: None (Preliminary result)   Collection Time: 06/09/19  1:33 PM   Specimen: Tracheal Aspirate; Respiratory  Result Value Ref Range Status   Specimen Description   Final    TRACHEAL ASPIRATE Performed at [facility], [address] [place] [address] [place], [place] [address]    Special Requests   Final    NONE Performed at [facility], [address] [place], [place] [address]    Gram Stain   Final    NO WBC SEEN ABUNDANT GRAM NEGATIVE RODS RARE GRAM POSITIVE COCCI Performed at [facility], [place] [address], [place], [place] [address]    Culture ABUNDANT PSEUDOMONAS AERUGINOSA  Final   Report Status PENDING  Incomplete  MRSA PCR Screening     Status: None   Collection Time: 06/09/19  5:36 PM   Specimen: Nasal Mucosa; Nasopharyngeal  Result Value Ref Range Status   MRSA by PCR NEGATIVE NEGATIVE Final    Comment:        The GeneXpert MRSA Assay (FDA approved for NASAL specimens only), is one component of a comprehensive MRSA colonization surveillance program. It is not intended to diagnose MRSA infection nor to guide or monitor treatment for MRSA infections. Performed at [facility], [address] [place], [place] [address]      Radiology Studies: [facility] Chest [facility] 1 View  Result Date: 06/11/2019 CLINICAL DATA:  Respiratory distress, intubation EXAM: PORTABLE CHEST 1 VIEW COMPARISON:  Radiograph 06/10/2019 FINDINGS: Endotracheal tube in the mid trachea, 3.4 cm from the carina. Transesophageal tube tip and side port terminate in the left upper quadrant, beyond the GE junction. Telemetry leads overlie the  chest. Persistent  streaky and reticular opacities in the lung bases which may reflect some residual atelectatic change upon more chronic interstitial and bronchitic features. There is blunting of the left cardiophrenic sulcus, may reflect some small pleural effusion. No right effusion. No pneumothorax. Cardiomediastinal contours are stable from prior. No acute osseous or soft tissue abnormality. IMPRESSION: 1. Persistent streaky and reticular opacities in the lung bases which may reflect some residual atelectatic change upon more chronic interstitial and bronchitic features. 2. Suspect small left pleural effusion. Electronically Signed   By: Lovena Le M.D.   On: 06/11/2019 06:55   DG Chest Port 1 View  Result Date: 06/10/2019 CLINICAL DATA:  Respiratory failure, intubation EXAM: PORTABLE CHEST 1 VIEW COMPARISON:  Radiograph 06/09/2019, CT 02/03/2019 FINDINGS: Endotracheal tube in the mid trachea, 5 cm from the carina. Transesophageal tube tip below the level of imaging, beyond the GE junction. Telemetry leads overlie the chest. Some improving lung volumes with diminished opacities in the lung bases likely improving atelectasis. Persistent mild central vascular congestion without features of frank edema. No visible effusion the portions of the costophrenic sulci are collimated from view. No pneumothoraces. No acute osseous or soft tissue abnormality. IMPRESSION: 1. Improving lung volumes with diminished opacities in the lung bases, likely improving atelectasis. 2. Persistent mild central vascular congestion. 3. Support devices as above. Electronically Signed   By: Lovena Le M.D.   On: 06/10/2019 06:15   ECHOCARDIOGRAM COMPLETE  Result Date: 06/09/2019    ECHOCARDIOGRAM REPORT   Patient Name:   Terri Casey Date of Exam: 06/09/2019 Medical Rec #:  [id number]         Height:       69.0 in Accession #:    [id number]        Weight:       276.9 lb Date of Birth:  08-23-1960        BSA:          2.372 m  Patient Age:    54 years          BP:           106/81 mmHg Patient Gender: F                 HR:           90 bpm. Exam Location:  [facility] Procedure: 2D Echo, Cardiac Doppler and Color Doppler Indications:    Dyspnea 786.09 / R06.00  History:        Patient has prior history of Echocardiogram examinations, most                 recent 03/26/2015. COPD; Risk Factors:Dyslipidemia, Current                 Smoker, Diabetes and Hypertension. Obesity, Class III, BMI                 40-49.9 (morbid obesity).  Sonographer:    Alvino Chapel RCS Referring Phys: [id number] DAVID TAT  Sonographer Comments: Technically difficult study due to poor echo windows. Patient on mechanical ventilator during echo. IMPRESSIONS  1. Left ventricular ejection fraction, by estimation, is 50 to 55%. The left ventricle has low normal function. The left ventricle demonstrates global hypokinesis. There is mild left ventricular hypertrophy. Left ventricular diastolic parameters are indeterminate.  2. Right ventricular systolic function is severely reduced. The right ventricular size is mildly enlarged. Mildly increased right ventricular wall thickness. Tricuspid regurgitation signal is inadequate for assessing PA pressure.  3. Right atrial size was upper normal.  4. The mitral valve is grossly normal with mild annular calcification. Trivial mitral valve regurgitation.  5. The aortic valve was not well visualized. Aortic valve regurgitation is not visualized. FINDINGS  Left Ventricle: Left ventricular ejection fraction, by estimation, is 50 to 55%. The left ventricle has low normal function. The left ventricle demonstrates global hypokinesis. Definity contrast agent was given IV to delineate the left ventricular endocardial borders. The left ventricular internal cavity size was normal in size. There is mild left ventricular hypertrophy. Left ventricular diastolic parameters are indeterminate. Right Ventricle: The right ventricular size is mildly  enlarged. Mildly increased right ventricular wall thickness. Right ventricular systolic function is severely reduced. Tricuspid regurgitation signal is inadequate for assessing PA pressure. Left Atrium: Left atrial size was normal in size. Right Atrium: Right atrial size was upper normal. Pericardium: A small pericardial effusion is present. The pericardial effusion is anterior to the right ventricle. Mitral Valve: The mitral valve is grossly normal. Mild mitral annular calcification. Trivial mitral valve regurgitation. Tricuspid Valve: The tricuspid valve is grossly normal. Tricuspid valve regurgitation is trivial. Aortic Valve: The aortic valve was not well visualized. Aortic valve regurgitation is not visualized. Pulmonic Valve: The pulmonic valve was not well visualized. Pulmonic valve regurgitation is not visualized. Aorta: The aortic root is normal in size and structure. Venous: IVC assessment for right atrial pressure unable to be performed due to mechanical ventilation. IAS/Shunts: No atrial level shunt detected by color flow Doppler.  LEFT VENTRICLE PLAX 2D LVIDd:         4.56 cm  Diastology LVIDs:         3.56 cm  LV e' lateral:   6.31 cm/s LV PW:         1.14 cm  LV E/e' lateral: 8.7 LV IVS:        1.15 cm  LV e' medial:    9.46 cm/s LVOT diam:     2.10 cm  LV E/e' medial:  5.8 LV SV:         41 LV SV Index:   17 LVOT Area:     3.46 cm  RIGHT VENTRICLE RV S prime:     6.42 cm/s TAPSE (M-mode): 1.2 cm LEFT ATRIUM             Index       RIGHT ATRIUM           Index LA Vol (A2C):   48.9 ml 20.61 ml/m RA Area:     18.40 cm LA Vol (A4C):   27.9 ml 11.76 ml/m RA Volume:   57.00 ml  24.03 ml/m LA Biplane Vol: 37.9 ml 15.98 ml/m  AORTIC VALVE LVOT Vmax:   70.00 cm/s LVOT Vmean:  51.200 cm/s LVOT VTI:    0.119 m MITRAL VALVE MV Area (PHT): 2.87 cm    SHUNTS MV Decel Time: 265 msec    Systemic VTI:  0.12 m MV E velocity: 54.70 cm/s  Systemic Diam: 2.10 cm MV A velocity: 35.30 cm/s MV E/A ratio:  1.55 Rozann Lesches MD Electronically signed by Rozann Lesches MD Signature Date/Time: 06/09/2019/4:40:55 PM    Final     Scheduled Meds: . atorvastatin  20 mg Per Tube QHS  . budesonide (PULMICORT) nebulizer solution  0.5 mg Nebulization BID  . chlorhexidine gluconate (MEDLINE KIT)  15 mL Mouth Rinse BID  . Chlorhexidine Gluconate Cloth  6 each Topical Q0600  . docusate  100 mg  Per Tube BID  . [START ON 06/12/2019] escitalopram  10 mg Per Tube Daily  . feeding supplement (PRO-STAT SUGAR FREE 64)  30 mL Per Tube BID  . feeding supplement (VITAL HIGH PROTEIN)  1,000 mL Per Tube Q24H  . insulin aspart  0-20 Units Subcutaneous Q4H  . ipratropium-albuterol  3 mL Nebulization Q6H  . [START ON 06/12/2019] levothyroxine  200 mcg Per Tube Q0600  . mouth rinse  15 mL Mouth Rinse 10 times per day  . methylPREDNISolone (SOLU-MEDROL) injection  40 mg Intravenous Q12H  . pantoprazole sodium  40 mg Per Tube Q24H  . polyethylene glycol  17 g Per Tube Daily  . rivaroxaban  20 mg Oral Daily   Continuous Infusions: . sodium chloride 10 mL/hr at 06/11/19 1516  . ceFEPime (MAXIPIME) IV Stopped (06/11/19 1127)  . dexmedetomidine (PRECEDEX) IV infusion 0.4 mcg/kg/hr (06/11/19 1516)     LOS: 2 days   Critical Care Procedure Note Authorized and Performed by: Murvin Natal MD  Total Critical Care time:  38 mins  Due to a high probability of clinically significant, life threatening deterioration, the patient required my highest level of preparedness to intervene emergently and I personally spent this critical care time directly and personally managing the patient.  This critical care time included obtaining a history; examining the patient, pulse oximetry; ordering and review of studies; arranging urgent treatment with development of a management plan; evaluation of patient's response of treatment; frequent reassessment; and discussions with other providers.  This critical care time was performed to assess and manage the high  probability of imminent and life threatening deterioration that could result in multi-organ failure.  It was exclusive of separately billable procedures and treating other patients and teaching time.    Irwin Brakeman, MD How to contact the [facility] Attending or Consulting provider [place] or covering provider during after hours [place], for this patient?  1. Check the care team in [facility] and look for a) attending/consulting [facility] provider listed and b) the [facility] team listed 2. Log into [email or web address] and use [facility] universal password to access. If you do not have the password, please contact the hospital operator. 3. Locate the [facility] provider you are looking for under [facility] and page to a number that you can be directly reached. 4. If you still have difficulty reaching the provider, please page the [facility] (Director on Call) for the Hospitalists listed on amion for assistance.  06/11/2019, 3:17 PM   '

## 2019-06-11 NOTE — Procedures (Signed)
Intubation Procedure Note AKIBA MELFI [id number] Aug 29, 1960  Procedure: Intubation Indications: Respiratory insufficiency  Procedure Details Consent: Unable to obtain consent because of emergent medical necessity. Time Out: Verified patient identification, verified procedure, site/side was marked, verified correct patient position, special equipment/implants available, medications/allergies/relevent history reviewed, required imaging and test results available.  Performed  Maximum sterile technique was used including gloves, hand hygiene and mask.  MAC and 3  Given 10 mg etomidate.  Inserted 7.5 ETT using video laryngoscope to 23 cm at lip without difficulty.  Confirmed with CO2 detector and ausculation.   Evaluation Hemodynamic Status: BP stable throughout; O2 sats: currently acceptable Patient's Current Condition: stable Complications: No apparent complications Patient did tolerate procedure well. Chest X-ray ordered to verify placement.  CXR: pending.  Chesley Mires, MD [place] Pager - [phone] 06/11/2019, 2:49 PM

## 2019-06-11 NOTE — Progress Notes (Signed)
eLink Physician-Brief Progress Note Patient Name: JUNIE AVILLA DOB: 05-Oct-1960 MRN: [id number]   Date of Service  06/11/2019  HPI/Events of Note  ABG shows relative respiratory alkalosis (Pt's baseline is hypercapnic respiratory failure due to advanced COPD) and marked hypoxemia of unclear etiology (COPD + ? Occult pulmonary edema).  eICU Interventions  RR reduced to 16, PEEP increased to 10, BNP ordered to assess volume status.        Kerry Kass Deion Forgue 06/11/2019, 8:58 PM

## 2019-06-11 NOTE — Progress Notes (Signed)
NAME:  Terri Casey, MRN:  [id number], DOB:  [birth year], LOS: 2 ADMISSION DATE:  06/09/2019, CONSULTATION DATE:  06/09/2019 REFERRING MD:  Dr. Carles Collet, [facility], CHIEF COMPLAINT:  Short of breath   Brief History   59 yo female smoker brought to ER with dyspnea and altered mental status from acute on chronic hypoxic/hypercapnic respiratory failure from COPD exacerbation.  She required intubation in ER.  Past Medical History  COPD on 2.5 liters, HTN, HLD, Hypothyroidism, CAD, OSA, A fib  Significant Hospital Events   6/08 Admit  Consults:    Procedures:  ETT 6/08 >>   Significant Diagnostic Tests:  Echo 6/08 >> EF 50 to 55%, global hypokinesis, mild LVH, severe RV systolic dysfunction  Micro Data:  SARS CoV2 PCR 6/08 >> negative Blood 6/08 >>  Sputum 6/08 >> Pseudomonas  Antimicrobials:  Doxycycline 6/08 >> 6/10 Cefepime 6/10 >>   Interim history/subjective:  More alert.  Tolerating pressure support.  Objective   Blood pressure 129/80, pulse 74, temperature 99.5 F (37.5 C), resp. rate 20, height 5\' 9"  (1.753 m), weight (!) 139 kg, SpO2 94 %.    Vent Mode: PRVC FiO2 (%):  [50 %-70 %] 50 % Set Rate:  [20 bmp] 20 bmp Vt Set:  [530 mL] 530 mL PEEP:  [5 cmH20] 5 cmH20 Plateau Pressure:  [19 cmH20-26 cmH20] 20 cmH20   Intake/Output Summary (Last 24 hours) at 06/11/2019 1359 Last data filed at 06/11/2019 1349 Gross per 24 hour  Intake 3906.66 ml  Output 1250 ml  Net 2656.66 ml   Filed Weights   06/09/19 0919 06/10/19 0500 06/11/19 0500  Weight: 125.6 kg 134.5 kg (!) 139 kg    Examination:  General - more alert Eyes - pupils reactive ENT - ETT in place Cardiac - regular rate/rhythm, no murmur Chest - decreased BS Abdomen - soft, non tender, + bowel sounds Extremities - 1+ edema Skin - no rashes Neuro - follows simple commands   Resolved Hospital Problem list   Hypotension from sedation  Assessment & Plan:   Acute on chronic hypoxic/hypercapnic respiratory  failure from COPD exacerbation, sleep disordered breathing, and on going tobacco abuse. Pseudomonal tracheobronchitis. - pressure support wean as able - mental status barrier to extubation trial - goal SpO2 88 to 95% - change ABx to cefepime - continue pulmicort, duoneb - continue solumedrol  - lasix 40 mg IV x one - will need Bipap qhs and prn after extubation - will need med reconciliation prior to discharge to determine which inhalers she has been using at home  Acute metabolic encephalopathy from hypercapnia. Hx of chronic pain, depression. - RASS goal 0 - prn versed, fentanyl  Steroid induced hyperglycemia. - SSI  Hx of HTN, HLD, a fib present prior to admission. - continue lipitor, xarelto  Hx of hypothyroidism. - TSH 32.331 from 06/09/19 - management per primary team  Best practice:  Diet: tube feeds DVT prophylaxis: xarelto GI prophylaxis: protonix Mobility: bed rest Code Status: full code  Labs:   CMP Latest Ref Rng & Units 06/11/2019 06/10/2019 06/09/2019  Glucose 70 - 99 mg/dL 121(H) 136(H) 131(H)  BUN 6 - 20 mg/dL 42(H) 24(H) 10  Creatinine 0.44 - 1.00 mg/dL 1.00 1.35(H) 0.70  Sodium 135 - 145 mmol/L 139 139 143  Potassium 3.5 - 5.1 mmol/L 4.6 4.4 4.4  Chloride 98 - 111 mmol/L 97(L) 95(L) 91(L)  CO2 22 - 32 mmol/L 35(H) 34(H) 38(H)  Calcium 8.9 - 10.3 mg/dL 8.8(L) 9.2 9.2  Total  Protein 6.5 - 8.1 g/dL 6.0(L) 6.3(L) -  Total Bilirubin 0.3 - 1.2 mg/dL 0.4 0.4 -  Alkaline Phos 38 - 126 U/L 86 105 -  AST 15 - 41 U/L 23 20 -  ALT 0 - 44 U/L 9 11 -    CBC Latest Ref Rng & Units 06/11/2019 06/10/2019 06/09/2019  WBC 4.0 - 10.5 K/uL 10.4 7.8 7.7  Hemoglobin 12.0 - 15.0 g/dL 11.8(L) 12.1 14.2  Hematocrit 36 - 46 % 39.3 40.5 49.8(H)  Platelets 150 - 400 K/uL 119(L) 125(L) 142(L)    ABG    Component Value Date/Time   PHART 7.414 06/11/2019 0324   PCO2ART 57.2 (H) 06/11/2019 0324   PO2ART 62.5 (L) 06/11/2019 0324   HCO3 33.5 (H) 06/11/2019 0324   TCO2 23.8  09/26/2012 2240   O2SAT 91.9 06/11/2019 0324    CBG (last 3)  Recent Labs    06/11/19 0426 06/11/19 0735 06/11/19 1136  GLUCAP 118* 131* 114*    Critical care time: 34 minutes  Chesley Mires, MD [place] Pager - [phone] 06/11/2019, 1:59 PM

## 2019-06-11 NOTE — Progress Notes (Signed)
Family able to be reached and phone numbers updated in chart.

## 2019-06-12 LAB — BASIC METABOLIC PANEL
Anion gap: 10 (ref 5–15)
BUN: 35 mg/dL — ABNORMAL HIGH (ref 6–20)
CO2: 33 mmol/L — ABNORMAL HIGH (ref 22–32)
Calcium: 8.9 mg/dL (ref 8.9–10.3)
Chloride: 96 mmol/L — ABNORMAL LOW (ref 98–111)
Creatinine, Ser: 0.94 mg/dL (ref 0.44–1.00)
GFR calc Af Amer: >60 mL/min (ref >60)
GFR calc non Af Amer: >60 mL/min (ref >60)
Glucose, Bld: 155 mg/dL — ABNORMAL HIGH (ref 70–99)
Potassium: 4.7 mmol/L (ref 3.5–5.1)
Sodium: 139 mmol/L (ref 135–145)

## 2019-06-12 LAB — CBC
HCT: 41.4 % (ref 36.0–46.0)
Hemoglobin: 12.9 g/dL (ref 12.0–15.0)
MCH: 33.6 pg (ref 26.0–34.0)
MCHC: 31.2 g/dL (ref 30.0–36.0)
MCV: 107.8 fL — ABNORMAL HIGH (ref 80.0–100.0)
Platelets: ADEQUATE 10*3/uL (ref 150–400)
RBC: 3.84 MIL/uL — ABNORMAL LOW (ref 3.87–5.11)
RDW: 14.3 % (ref 11.5–15.5)
WBC: 10.7 10*3/uL — ABNORMAL HIGH (ref 4.0–10.5)
nRBC: 0 % (ref 0.0–0.2)

## 2019-06-12 LAB — GLUCOSE, CAPILLARY
Glucose-Capillary: 115 mg/dL — ABNORMAL HIGH (ref 70–99)
Glucose-Capillary: 138 mg/dL — ABNORMAL HIGH (ref 70–99)
Glucose-Capillary: 140 mg/dL — ABNORMAL HIGH (ref 70–99)
Glucose-Capillary: 152 mg/dL — ABNORMAL HIGH (ref 70–99)
Glucose-Capillary: 168 mg/dL — ABNORMAL HIGH (ref 70–99)
Glucose-Capillary: 183 mg/dL — ABNORMAL HIGH (ref 70–99)

## 2019-06-12 LAB — URINE CULTURE: Culture: 60000 — AB

## 2019-06-12 LAB — MAGNESIUM: Magnesium: 2.2 mg/dL (ref 1.7–2.4)

## 2019-06-12 LAB — PHOSPHORUS: Phosphorus: 3.7 mg/dL (ref 2.5–4.6)

## 2019-06-12 MED ORDER — TOPIRAMATE 25 MG PO TABS
25.0000 mg | ORAL_TABLET | Freq: Every day | ORAL | Status: DC
  Administered 2019-06-12 – 2019-06-13 (×2): 25 mg
  Filled 2019-06-12 (×2): qty 1

## 2019-06-12 MED ORDER — CYCLOBENZAPRINE HCL 10 MG PO TABS
5.0000 mg | ORAL_TABLET | Freq: Three times a day (TID) | ORAL | Status: DC
  Administered 2019-06-12 – 2019-06-19 (×20): 5 mg via ORAL
  Filled 2019-06-12 (×20): qty 1

## 2019-06-12 MED ORDER — IPRATROPIUM-ALBUTEROL 0.5-2.5 (3) MG/3ML IN SOLN
3.0000 mL | Freq: Three times a day (TID) | RESPIRATORY_TRACT | Status: DC
  Administered 2019-06-13 – 2019-06-17 (×13): 3 mL via RESPIRATORY_TRACT
  Filled 2019-06-12 (×13): qty 3

## 2019-06-12 MED ORDER — ENOXAPARIN SODIUM 150 MG/ML ~~LOC~~ SOLN
140.0000 mg | Freq: Two times a day (BID) | SUBCUTANEOUS | Status: DC
  Administered 2019-06-12 – 2019-06-13 (×4): 140 mg via SUBCUTANEOUS
  Filled 2019-06-12 (×5): qty 0.93

## 2019-06-12 MED ORDER — VITAL HIGH PROTEIN PO LIQD
1000.0000 mL | ORAL | Status: DC
  Administered 2019-06-12: 1000 mL

## 2019-06-12 MED ORDER — PROPOFOL 1000 MG/100ML IV EMUL
0.0000 ug/kg/min | INTRAVENOUS | Status: DC
  Administered 2019-06-12: 20 ug/kg/min via INTRAVENOUS
  Administered 2019-06-12: 5 ug/kg/min via INTRAVENOUS
  Administered 2019-06-12: 20 ug/kg/min via INTRAVENOUS
  Administered 2019-06-12: 30 ug/kg/min via INTRAVENOUS
  Administered 2019-06-13: 40 ug/kg/min via INTRAVENOUS
  Administered 2019-06-13: 30 ug/kg/min via INTRAVENOUS
  Administered 2019-06-13: 40 ug/kg/min via INTRAVENOUS
  Filled 2019-06-12 (×7): qty 100

## 2019-06-12 MED ORDER — AMLODIPINE BESYLATE 5 MG PO TABS
5.0000 mg | ORAL_TABLET | Freq: Every day | ORAL | Status: DC
  Administered 2019-06-12 – 2019-06-13 (×2): 5 mg
  Filled 2019-06-12 (×2): qty 1

## 2019-06-12 MED ORDER — ALPRAZOLAM 0.5 MG PO TABS: 0.5000 mg | ORAL_TABLET | Freq: Two times a day (BID) | ORAL | Status: DC

## 2019-06-12 MED ORDER — LORATADINE 10 MG PO TABS
10.0000 mg | ORAL_TABLET | Freq: Every day | ORAL | Status: DC
  Administered 2019-06-12 – 2019-06-19 (×8): 10 mg via ORAL
  Filled 2019-06-12 (×10): qty 1

## 2019-06-12 NOTE — Progress Notes (Signed)
[facility] Progress Note Patient Name: Terri Casey DOB: 1960-01-05 MRN: [id number]   Date of Service  06/12/2019  HPI/Events of Note  Patient wide awake on a Precedex infusion but quickly sedated with PRN Versed and Fentanyl which are on his MAR.  eICU Interventions  Continue current sedation plan with Precedex and prn Fentanyl and Versed.        Kerry Kass Naoko Diperna 06/12/2019, 4:37 AM

## 2019-06-12 NOTE — Progress Notes (Signed)
Nutrition Follow-up  DOCUMENTATION CODES:   Morbid obesity  INTERVENTION:   Tube feeding via OG tube: - Vital High Protein @ 50 ml/hr (1200 ml/day) - Pro-stat 30 ml BID  Tube feeding regimen provides 1400 kcal, 135 grams of protein, and 1003 ml of H2O.   Tube feeding regimen and current propofol provides 1628 total kcal (100% of needs).  NUTRITION DIAGNOSIS:   Inadequate oral intake related to inability to eat as evidenced by NPO status.  Ongoing, being addressed via TF  GOAL:   Provide needs based on ASPEN/SCCM guidelines  Met via TF  MONITOR:   Vent status, Labs, Weight trends, I & O's, TF tolerance  REASON FOR ASSESSMENT:   Consult Enteral/tube feeding initiation and management  ASSESSMENT:   58 year old female with past medical history of COPD, chronic respiratory failure on 2.5 L oxygen, HTN, HLD, hypothyroidism, morbid obesity, nonobstructive CAD, OSA presenting with 2-3 day history of worsening shortness of breath, upon ED arrival pt on CPAP noted more somnolent, minimally responsive and intubated for airway protection.  6/08 - admit, intubated 6/10 - extubated, reintubated  Per CCM note, pt with pseudomonas bronchitis. Plan is to keep sedated today and trial extubation again over the weekend before pursuing trach.  OG tube in place with distal end within body of the stomach.  Weight up 15.4 kg since admission on 6/08. Pt with mild pitting generalized edema. EDW: 125.6 kg  Discussed pt with RN. Pt much more comfortable on propofol drip.  Current TF: Vital High Protein @ 40 ml/hr, Pro-stat 30 ml BID  Patient is currently intubated on ventilator support MV: 8.2 L/min Temp (24hrs), Avg:99.3 F (37.4 C), Min:98.1 F (36.7 C), Max:99.7 F (37.6 C) BP (cuff): 143/83 MAP (cuff): 102  Drips: Propofol: 8.5 ml/hr (provides 224 kcal daily from lipid) Precedex: 7 ml/hr  Medications reviewed and include: colace, solu-medrol, protonix, miralax, IV  abx  Labs reviewed. CBG's: 128-183 x 24 hours  UOP: 2350 ml x 24 hours I/O's: +5.0 L since admit  NUTRITION - FOCUSED PHYSICAL EXAM:    Most Recent Value  Orbital Region No depletion  Upper Arm Region No depletion  Thoracic and Lumbar Region No depletion  Buccal Region Unable to assess  Temple Region No depletion  Clavicle Bone Region No depletion  Clavicle and Acromion Bone Region No depletion  Scapular Bone Region Unable to assess  Dorsal Hand No depletion  Patellar Region No depletion  Anterior Thigh Region No depletion  Posterior Calf Region No depletion  Edema (RD Assessment) Mild  Hair Reviewed  Eyes Unable to assess  Mouth Unable to assess  Skin Reviewed  Nails Reviewed       Diet Order:   Diet Order            Diet NPO time specified  Diet effective now                 EDUCATION NEEDS:   No education needs have been identified at this time  Skin:  Skin Assessment: Reviewed RN Assessment  Last BM:  no documented BM  Height:   Ht Readings from Last 1 Encounters:  06/11/19 5' 9"  (1.753 m)    Weight:   Wt Readings from Last 1 Encounters:  06/12/19 (!) 141 kg    Ideal Body Weight:  65.9 kg  BMI:  Body mass index is 45.9 kg/m.  Estimated Nutritional Needs:   Kcal:  3903-0092  Protein:  132-150 grams  Fluid:  >/=  1.5 L    Gaynell Face, MS, RD, LDN Inpatient Clinical Dietitian Pager: [phone] Weekend/After Hours: [phone]

## 2019-06-12 NOTE — Progress Notes (Signed)
NAME:  Terri Casey, MRN:  [id number], DOB:  [birth year], LOS: 3 ADMISSION DATE:  06/09/2019, CONSULTATION DATE:  06/09/2019 REFERRING MD:  Dr. Carles Collet, [facility], CHIEF COMPLAINT:  Short of breath   Brief History   59 yo female smoker brought to ER with dyspnea and altered mental status from acute on chronic hypoxic/hypercapnic respiratory failure from COPD exacerbation.  She required intubation in ER.  Past Medical History  COPD on 2.5 liters, HTN, HLD, Hypothyroidism, CAD, OSA, A fib  Significant Hospital Events   6/08 Admit  Consults:    Procedures:  ETT 6/08 >>   Significant Diagnostic Tests:  Echo 6/08 >> EF 50 to 55%, global hypokinesis, mild LVH, severe RV systolic dysfunction  Micro Data:  SARS CoV2 PCR 6/08 >> negative Blood 6/08 >>  Sputum 6/08 >> Pseudomonas  Antimicrobials:  Doxycycline 6/08 >> 6/10 Cefepime 6/10 >>   Interim history/subjective:  Sedated on vent. Agitation overnight nearing the point of self extubation.   Objective   Blood pressure (!) 163/96, pulse (!) 55, temperature 99 F (37.2 C), temperature source Oral, resp. rate 16, height 5\' 9"  (1.753 m), weight (!) 141 kg, SpO2 98 %.    Vent Mode: PRVC FiO2 (%):  [50 %-100 %] 60 % Set Rate:  [16 bmp-20 bmp] 16 bmp Vt Set:  [530 mL] 530 mL PEEP:  [5 cmH20-10 cmH20] 10 cmH20 Plateau Pressure:  [20 cmH20-27 cmH20] 21 cmH20   Intake/Output Summary (Last 24 hours) at 06/12/2019 0747 Last data filed at 06/12/2019 1027 Gross per 24 hour  Intake 2608 ml  Output 2350 ml  Net 258 ml   Filed Weights   06/10/19 0500 06/11/19 0500 06/12/19 0427  Weight: 134.5 kg (!) 139 kg (!) 141 kg    Examination: General:  Overweight female in NAD on vent Neuro: Sedated RASS -3.  HEENT:  [place]/AT, No JVD noted, PERRL Cardiovascular:  RRR, no MRG Lungs:  Clear, distant, no wheeze on exam.  Abdomen:  Soft, non-distended, non-tender.  Musculoskeletal:  No acute deformity. No edema.  Skin:  Intact, MMM   Resolved  Hospital Problem list   Hypotension from sedation  Assessment & Plan:   Acute on chronic hypoxic/hypercapnic respiratory failure from COPD exacerbation, sleep disordered breathing, and on going tobacco abuse Left sided pleural effusion Pseudomonal tracheobronchitis - Full vent support today - Will attempt to wean FiO2/PEEP over the course of the day. If unable will need further workup for hypoxia including CTA chest and possible bubble study.  - goal SpO2 88 to 95% - Cefepime - continue pulmicort, duoneb - continue solumedrol 40mg  BID  Acute metabolic encephalopathy from hypercapnia. Hx of chronic pain, depression. - RASS goal -1 to -2 - Propofol infusion - PRN fentanyl and versed.   Steroid induced hyperglycemia. - SSI  Hx of HTN, HLD, a fib present prior to admission. - continue lipitor, xarelto  Hx of hypothyroidism. TSH 32 on admission - Continue synthroid incrased dose to 263mcg from 126mcg at home.   Labs pending  Best practice:  Diet: tube feeds DVT prophylaxis: xarelto GI prophylaxis: protonix Mobility: bed rest Code Status: full code  Labs:   CMP Latest Ref Rng & Units 06/11/2019 06/11/2019 06/11/2019  Glucose 70 - 99 mg/dL - - 121(H)  BUN 6 - 20 mg/dL - - 42(H)  Creatinine 0.44 - 1.00 mg/dL - - 1.00  Sodium 135 - 145 mmol/L 137 138 139  Potassium 3.5 - 5.1 mmol/L 4.3 4.3 4.6  Chloride 98 -  111 mmol/L - - 97(L)  CO2 22 - 32 mmol/L - - 35(H)  Calcium 8.9 - 10.3 mg/dL - - 8.8(L)  Total Protein 6.5 - 8.1 g/dL - - 6.0(L)  Total Bilirubin 0.3 - 1.2 mg/dL - - 0.4  Alkaline Phos 38 - 126 U/L - - 86  AST 15 - 41 U/L - - 23  ALT 0 - 44 U/L - - 9    CBC Latest Ref Rng & Units 06/11/2019 06/11/2019 06/11/2019  WBC 4.0 - 10.5 K/uL - - 10.4  Hemoglobin 12.0 - 15.0 g/dL 13.3 13.3 11.8(L)  Hematocrit 36 - 46 % 39.0 39.0 39.3  Platelets 150 - 400 K/uL - - 119(L)    ABG    Component Value Date/Time   PHART 7.438 06/11/2019 2353   PCO2ART 57.8 (H) 06/11/2019 2353     PO2ART 117 (H) 06/11/2019 2353   HCO3 39.1 (H) 06/11/2019 2353   TCO2 41 (H) 06/11/2019 2353   O2SAT 99.0 06/11/2019 2353    CBG (last 3)  Recent Labs    06/11/19 1938 06/11/19 2338 06/12/19 0339  GLUCAP 145* 143* 183*    Critical care time 40 minutes  Georgann Housekeeper, AGACNP-BC [place] for personal pager PCCM on call pager [phone]  06/12/2019 7:54 AM

## 2019-06-12 NOTE — Progress Notes (Signed)
[facility] Progress Note Patient Name: Terri Casey DOB: 08-24-1960 MRN: [id number]   Date of Service  06/12/2019  HPI/Events of Note  Patient needs bilateral wrist restraints to prevent self-extubation.  eICU Interventions  Order for bilateral soft wrist restraints entered.        Kerry Kass Darci Lykins 06/12/2019, 6:09 AM

## 2019-06-12 NOTE — Progress Notes (Signed)
Family, CJ, at bedside. Updated on plan of care and patient status. All questions addressed at this time.

## 2019-06-13 ENCOUNTER — Inpatient Hospital Stay (HOSPITAL_COMMUNITY): Payer: Medicaid Other

## 2019-06-13 DIAGNOSIS — J449 Chronic obstructive pulmonary disease, unspecified: Secondary | ICD-10-CM

## 2019-06-13 DIAGNOSIS — J181 Lobar pneumonia, unspecified organism: Secondary | ICD-10-CM

## 2019-06-13 DIAGNOSIS — D696 Thrombocytopenia, unspecified: Secondary | ICD-10-CM

## 2019-06-13 LAB — POCT I-STAT 7, (LYTES, BLD GAS, ICA,H+H)
Acid-Base Excess: 11 mmol/L — ABNORMAL HIGH (ref 0.0–2.0)
Bicarbonate: 37.7 mmol/L — ABNORMAL HIGH (ref 20.0–28.0)
Calcium, Ion: 1.26 mmol/L (ref 1.15–1.40)
HCT: 37 % (ref 36.0–46.0)
Hemoglobin: 12.6 g/dL (ref 12.0–15.0)
O2 Saturation: 95 %
Patient temperature: 97.8
Potassium: 3.9 mmol/L (ref 3.5–5.1)
Sodium: 139 mmol/L (ref 135–145)
TCO2: 40 mmol/L — ABNORMAL HIGH (ref 22–32)
pCO2 arterial: 59.3 mmHg — ABNORMAL HIGH (ref 32.0–48.0)
pH, Arterial: 7.41 (ref 7.350–7.450)
pO2, Arterial: 74 mmHg — ABNORMAL LOW (ref 83.0–108.0)

## 2019-06-13 LAB — GLUCOSE, CAPILLARY
Glucose-Capillary: 117 mg/dL — ABNORMAL HIGH (ref 70–99)
Glucose-Capillary: 118 mg/dL — ABNORMAL HIGH (ref 70–99)
Glucose-Capillary: 120 mg/dL — ABNORMAL HIGH (ref 70–99)
Glucose-Capillary: 125 mg/dL — ABNORMAL HIGH (ref 70–99)
Glucose-Capillary: 85 mg/dL (ref 70–99)
Glucose-Capillary: 99 mg/dL (ref 70–99)

## 2019-06-13 LAB — COMPREHENSIVE METABOLIC PANEL
ALT: 14 U/L (ref 0–44)
AST: 17 U/L (ref 15–41)
Albumin: 2.7 g/dL — ABNORMAL LOW (ref 3.5–5.0)
Alkaline Phosphatase: 67 U/L (ref 38–126)
Anion gap: 9 (ref 5–15)
BUN: 34 mg/dL — ABNORMAL HIGH (ref 6–20)
CO2: 32 mmol/L (ref 22–32)
Calcium: 8.9 mg/dL (ref 8.9–10.3)
Chloride: 97 mmol/L — ABNORMAL LOW (ref 98–111)
Creatinine, Ser: 0.7 mg/dL (ref 0.44–1.00)
GFR calc Af Amer: >60 mL/min (ref >60)
GFR calc non Af Amer: >60 mL/min (ref >60)
Glucose, Bld: 129 mg/dL — ABNORMAL HIGH (ref 70–99)
Potassium: 4.4 mmol/L (ref 3.5–5.1)
Sodium: 138 mmol/L (ref 135–145)
Total Bilirubin: 0.5 mg/dL (ref 0.3–1.2)
Total Protein: 5.7 g/dL — ABNORMAL LOW (ref 6.5–8.1)

## 2019-06-13 LAB — CBC
HCT: 38.9 % (ref 36.0–46.0)
Hemoglobin: 12.2 g/dL (ref 12.0–15.0)
MCH: 33.3 pg (ref 26.0–34.0)
MCHC: 31.4 g/dL (ref 30.0–36.0)
MCV: 106.3 fL — ABNORMAL HIGH (ref 80.0–100.0)
Platelets: 115 10*3/uL — ABNORMAL LOW (ref 150–400)
RBC: 3.66 MIL/uL — ABNORMAL LOW (ref 3.87–5.11)
RDW: 14.4 % (ref 11.5–15.5)
WBC: 9.9 10*3/uL (ref 4.0–10.5)
nRBC: 0 % (ref 0.0–0.2)

## 2019-06-13 LAB — TRIGLYCERIDES: Triglycerides: 142 mg/dL (ref <150)

## 2019-06-13 LAB — CULTURE, RESPIRATORY W GRAM STAIN: Gram Stain: NONE SEEN

## 2019-06-13 MED ORDER — BETHANECHOL CHLORIDE 10 MG PO TABS
10.0000 mg | ORAL_TABLET | Freq: Three times a day (TID) | ORAL | Status: DC
  Filled 2019-06-13 (×2): qty 1

## 2019-06-13 NOTE — Progress Notes (Signed)
[facility] Progress Note Patient Name: Terri Casey DOB: [birth year] MRN: [id number]   Date of Service  06/13/2019  HPI/Events of Note  Restraints order about to expire in one hour but patient still needs restraints.  eICU Interventions  Restraints order renewed.        Kerry Kass Dequarius Jeffries 06/13/2019, 5:06 AM

## 2019-06-13 NOTE — Progress Notes (Signed)
NAME:  Terri Casey, MRN:  [id number], DOB:  22-Aug-1960, LOS: 4 ADMISSION DATE:  06/09/2019, CONSULTATION DATE:  06/09/2019 REFERRING MD:  Dr. Carles Collet, [facility], CHIEF COMPLAINT:  Short of breath   Brief History   59 yo female smoker brought to ER with dyspnea and altered mental status from acute on chronic hypoxic/hypercapnic respiratory failure from COPD exacerbation.  She required intubation in ER.  Past Medical History  COPD on 2.5 liters, HTN, HLD, Hypothyroidism, CAD, OSA, A fib  Significant Hospital Events   6/08 Admit  Consults:    Procedures:  ETT 6/08 >>   Significant Diagnostic Tests:  Echo 6/08 >> EF 50 to 55%, global hypokinesis, mild LVH, severe RV systolic dysfunction  Micro Data:  SARS CoV2 PCR 6/08 >> negative Blood 6/08 >>  Sputum 6/08 >> Pseudomonas  Antimicrobials:  Doxycycline 6/08 >> 6/10 Cefepime 6/10 >>   Interim history/subjective:  Awake and interactive Still on PRVC  Objective   Blood pressure 132/82, pulse (!) 52, temperature 97.8 F (36.6 C), temperature source Axillary, resp. rate 16, height 5\' 9"  (1.753 m), weight (!) 138.6 kg, SpO2 96 %.    Vent Mode: PRVC FiO2 (%):  [50 %] 50 % Set Rate:  [16 bmp] 16 bmp Vt Set:  [530 mL] 530 mL PEEP:  [8 cmH20-10 cmH20] 8 cmH20 Plateau Pressure:  [20 cmH20-22 cmH20] 22 cmH20   Intake/Output Summary (Last 24 hours) at 06/13/2019 5188 Last data filed at 06/13/2019 0600 Gross per 24 hour  Intake 756.57 ml  Output 1575 ml  Net -818.43 ml   Filed Weights   06/11/19 0500 06/12/19 0427 06/13/19 0417  Weight: (!) 139 kg (!) 141 kg (!) 138.6 kg    Examination: General:  Overweight female in NAD on vent Neuro: Awake and alert, interactive HEENT:  [place]/AT, No JVD noted, PERRL Cardiovascular:  RRR, no MRG Lungs: Does have some mild rhonchi Abdomen:  Soft, non-distended, non-tender.  Musculoskeletal:  No acute deformity. No edema.  Skin:  Intact, MMM   Resolved Hospital Problem list   Hypotension  from sedation  Assessment & Plan:   Acute on chronic hypoxic/hypercapnic respiratory failure from COPD exacerbation, sleep disordered breathing, and on going tobacco abuse Left sided pleural effusion Pseudomonal tracheobronchitis -We will wean as tolerated - goal SpO2 88 to 95% - Cefepime - continue pulmicort, duoneb - continue solumedrol 40mg  BID  Acute metabolic encephalopathy from hypercapnia. Hx of chronic pain, depression. - RASS goal -1 to -2 - Propofol infusion - PRN fentanyl and versed.   Steroid induced hyperglycemia. - SSI  Hx of HTN, HLD, a fib present prior to admission. - continue lipitor, xarelto  Hx of hypothyroidism. TSH 32 on admission - Continue synthroid incrased dose to 24mcg from 141mcg at home.   Labs unremarkable today Bibasal atelectasis, reviewed by myself  Best practice:  Diet: tube feeds DVT prophylaxis: xarelto GI prophylaxis: protonix Mobility: bed rest Code Status: full code  Labs:   CMP Latest Ref Rng & Units 06/13/2019 06/12/2019 06/11/2019  Glucose 70 - 99 mg/dL 129(H) 155(H) -  BUN 6 - 20 mg/dL 34(H) 35(H) -  Creatinine 0.44 - 1.00 mg/dL 0.70 0.94 -  Sodium 135 - 145 mmol/L 138 139 137  Potassium 3.5 - 5.1 mmol/L 4.4 4.7 4.3  Chloride 98 - 111 mmol/L 97(L) 96(L) -  CO2 22 - 32 mmol/L 32 33(H) -  Calcium 8.9 - 10.3 mg/dL 8.9 8.9 -  Total Protein 6.5 - 8.1 g/dL 5.7(L) - -  Total Bilirubin 0.3 - 1.2 mg/dL 0.5 - -  Alkaline Phos 38 - 126 U/L 67 - -  AST 15 - 41 U/L 17 - -  ALT 0 - 44 U/L 14 - -    CBC Latest Ref Rng & Units 06/13/2019 06/12/2019 06/11/2019  WBC 4.0 - 10.5 K/uL 9.9 10.7(H) -  Hemoglobin 12.0 - 15.0 g/dL 12.2 12.9 13.3  Hematocrit 36 - 46 % 38.9 41.4 39.0  Platelets 150 - 400 K/uL 115(L) PLATELET CLUMPS NOTED ON SMEAR, COUNT APPEARS ADEQUATE -    ABG    Component Value Date/Time   PHART 7.438 06/11/2019 2353   PCO2ART 57.8 (H) 06/11/2019 2353   PO2ART 117 (H) 06/11/2019 2353   HCO3 39.1 (H) 06/11/2019 2353    TCO2 41 (H) 06/11/2019 2353   O2SAT 99.0 06/11/2019 2353    CBG (last 3)  Recent Labs    06/12/19 2334 06/13/19 0411 06/13/19 0750  GLUCAP 138* 120* 117*    The patient is critically ill with multiple organ systems failure and requires high complexity decision making for assessment and support, frequent evaluation and titration of therapies, application of advanced monitoring technologies and extensive interpretation of multiple databases. Critical Care Time devoted to patient care services described in this note independent of APP/resident time (if applicable)  is 35 minutes.   Sherrilyn Rist MD [facility] Personal pager: [phone] If unanswered, please page CCM On-call: [phone]

## 2019-06-13 NOTE — Progress Notes (Signed)
eLink Physician-Brief Progress Note Patient Name: Terri Casey DOB: 1960-10-28 MRN: [id number]   Date of Service  06/13/2019  HPI/Events of Note  Patient reports being on BiPAP at home at night. She has COPD and was extubated today. Home settings unknown.   eICU Interventions  Entered order for BiPAP QHS. RT to titrate.     Intervention Category Minor Interventions: Other:  Charlott Rakes 06/13/2019, 9:02 PM

## 2019-06-13 NOTE — Procedures (Signed)
Extubation Procedure Note  Patient Details:   Name: Terri Casey DOB: 01/29/1960 MRN: [id number]   Airway Documentation:    Vent end date: (not recorded) Vent end time: (not recorded)   Evaluation  O2 sats: stable throughout Complications: No apparent complications Patient did tolerate procedure well. Bilateral Breath Sounds: Clear, Diminished   Yes, patient able to vocalize. Patient extubated to 4L nasal cannula at 1120 per MD order. Patient tolerated well and vitals stable. RT will continue to monitor.  Seward Speck 06/13/2019, 11:25 AM

## 2019-06-14 LAB — COMPREHENSIVE METABOLIC PANEL
ALT: 16 U/L (ref 0–44)
AST: 22 U/L (ref 15–41)
Albumin: 3 g/dL — ABNORMAL LOW (ref 3.5–5.0)
Alkaline Phosphatase: 77 U/L (ref 38–126)
Anion gap: 12 (ref 5–15)
BUN: 24 mg/dL — ABNORMAL HIGH (ref 6–20)
CO2: 31 mmol/L (ref 22–32)
Calcium: 9.3 mg/dL (ref 8.9–10.3)
Chloride: 97 mmol/L — ABNORMAL LOW (ref 98–111)
Creatinine, Ser: 0.66 mg/dL (ref 0.44–1.00)
GFR calc Af Amer: >60 mL/min (ref >60)
GFR calc non Af Amer: >60 mL/min (ref >60)
Glucose, Bld: 103 mg/dL — ABNORMAL HIGH (ref 70–99)
Potassium: 4.9 mmol/L (ref 3.5–5.1)
Sodium: 140 mmol/L (ref 135–145)
Total Bilirubin: 0.9 mg/dL (ref 0.3–1.2)
Total Protein: 6.4 g/dL — ABNORMAL LOW (ref 6.5–8.1)

## 2019-06-14 LAB — GLUCOSE, CAPILLARY
Glucose-Capillary: 103 mg/dL — ABNORMAL HIGH (ref 70–99)
Glucose-Capillary: 109 mg/dL — ABNORMAL HIGH (ref 70–99)
Glucose-Capillary: 111 mg/dL — ABNORMAL HIGH (ref 70–99)
Glucose-Capillary: 114 mg/dL — ABNORMAL HIGH (ref 70–99)
Glucose-Capillary: 92 mg/dL (ref 70–99)
Glucose-Capillary: 93 mg/dL (ref 70–99)

## 2019-06-14 LAB — TRIGLYCERIDES: Triglycerides: 216 mg/dL — ABNORMAL HIGH (ref <150)

## 2019-06-14 LAB — HEPARIN ANTI-XA: Heparin LMW: 1.64 IU/mL

## 2019-06-14 MED ORDER — ESCITALOPRAM OXALATE 10 MG PO TABS
10.0000 mg | ORAL_TABLET | Freq: Every day | ORAL | Status: DC
  Administered 2019-06-14 – 2019-06-19 (×6): 10 mg via ORAL
  Filled 2019-06-14 (×6): qty 1

## 2019-06-14 MED ORDER — DOCUSATE SODIUM 100 MG PO CAPS
100.0000 mg | ORAL_CAPSULE | Freq: Two times a day (BID) | ORAL | Status: DC
  Administered 2019-06-14 – 2019-06-19 (×11): 100 mg via ORAL
  Filled 2019-06-14 (×11): qty 1

## 2019-06-14 MED ORDER — TOPIRAMATE 25 MG PO TABS
25.0000 mg | ORAL_TABLET | Freq: Every day | ORAL | Status: DC
  Administered 2019-06-14 – 2019-06-19 (×6): 25 mg via ORAL
  Filled 2019-06-14 (×6): qty 1

## 2019-06-14 MED ORDER — AMLODIPINE BESYLATE 5 MG PO TABS
5.0000 mg | ORAL_TABLET | Freq: Every day | ORAL | Status: DC
  Administered 2019-06-14: 5 mg via ORAL
  Filled 2019-06-14: qty 1

## 2019-06-14 MED ORDER — PREDNISONE 20 MG PO TABS
30.0000 mg | ORAL_TABLET | Freq: Every day | ORAL | Status: AC
  Administered 2019-06-15 – 2019-06-19 (×5): 30 mg via ORAL
  Filled 2019-06-14 (×2): qty 1
  Filled 2019-06-14: qty 3
  Filled 2019-06-14: qty 1
  Filled 2019-06-14: qty 3

## 2019-06-14 MED ORDER — ATORVASTATIN CALCIUM 10 MG PO TABS
20.0000 mg | ORAL_TABLET | Freq: Every day | ORAL | Status: DC
  Administered 2019-06-14 – 2019-06-18 (×5): 20 mg via ORAL
  Filled 2019-06-14 (×5): qty 2

## 2019-06-14 MED ORDER — POLYETHYLENE GLYCOL 3350 17 G PO PACK
17.0000 g | PACK | Freq: Every day | ORAL | Status: DC
  Administered 2019-06-14 – 2019-06-19 (×4): 17 g via ORAL
  Filled 2019-06-14 (×6): qty 1

## 2019-06-14 MED ORDER — PANTOPRAZOLE SODIUM 40 MG PO TBEC
40.0000 mg | DELAYED_RELEASE_TABLET | Freq: Every day | ORAL | Status: DC
  Administered 2019-06-14 – 2019-06-19 (×6): 40 mg via ORAL
  Filled 2019-06-14 (×6): qty 1

## 2019-06-14 MED ORDER — LEVOTHYROXINE SODIUM 200 MCG PO TABS
200.0000 ug | ORAL_TABLET | Freq: Every day | ORAL | Status: DC
  Administered 2019-06-15: 200 ug via ORAL
  Filled 2019-06-14: qty 1
  Filled 2019-06-14: qty 8
  Filled 2019-06-14: qty 1

## 2019-06-14 MED ORDER — ENOXAPARIN SODIUM 120 MG/0.8ML ~~LOC~~ SOLN
120.0000 mg | Freq: Two times a day (BID) | SUBCUTANEOUS | Status: AC
  Administered 2019-06-14 – 2019-06-15 (×3): 120 mg via SUBCUTANEOUS
  Filled 2019-06-14 (×3): qty 0.8

## 2019-06-14 MED ORDER — BETHANECHOL CHLORIDE 10 MG PO TABS
10.0000 mg | ORAL_TABLET | Freq: Three times a day (TID) | ORAL | Status: DC
  Administered 2019-06-14 – 2019-06-19 (×16): 10 mg via ORAL
  Filled 2019-06-14 (×20): qty 1

## 2019-06-14 NOTE — Progress Notes (Signed)
[facility] for enoxaparin Indication: hx of pulmonary embolus  Allergies  Allergen Reactions  . Estrogens Hives  . Mustard [employer]  . Strawberry Extract Hives    Patient Measurements: Height: 5\' 9"  (175.3 cm) Weight: (!) 138.6 kg (305 lb 8.9 oz) IBW/kg (Calculated) : 66.2  Vital Signs: Temp: 97.5 F (36.4 C) (06/13 0401) Temp Source: Axillary (06/13 0401) BP: 145/97 (06/13 0600) Pulse Rate: 78 (06/13 0600)  Labs: Recent Labs    06/12/19 0807 06/12/19 0807 06/13/19 0258 06/13/19 1026 06/14/19 0233  HGB 12.9   < > 12.2 12.6  --   HCT 41.4  --  38.9 37.0  --   PLT PLATELET CLUMPS NOTED ON SMEAR, COUNT APPEARS ADEQUATE  --  115*  --   --   HEPRLOWMOCWT  --   --   --   --  1.64  CREATININE 0.94  --  0.70  --   --    < > = values in this interval not displayed.    Estimated Creatinine Clearance: 112.4 mL/min (by C-G formula based on SCr of 0.7 mg/dL).   Assessment: CC/HPI: from home with SOB  PMH: COPD on 2.5L pta, HTN, HLD, hypothyroid, CAD, OSA, tobacco, chronic pain  Anticoag: Xarelto 20/d for hx "clot" >> Lovenox since might need trach Xa lvl 6/13 1.64 on 140 q12 - lvl high - pt was on xarelto but last dose > 60 hrs prior - affect of drug on level should be minimal based on patient's renal fx  Renal: SCr 0.7  Heme/Onc: H/H 12.2/38.9, Plt 115  Goal of Therapy:  Anti-Xa level 0.6-1 units/ml 4hrs after LMWH dose given Monitor platelets by anticoagulation protocol: Yes   Plan:  Adjust enox to 120 q12h F/U repeat Xa lvl tue if continues F/U return to rivaroxaban  Barth Kirks, PharmD, BCPS, BCCCP Clinical Pharmacist [phone]  Please check AMION for all [facility] [place] numbers  06/14/2019 8:01 AM

## 2019-06-14 NOTE — Progress Notes (Signed)
Assisted tele visit to patient with family member.  Egan Berkheimer M, RN  

## 2019-06-14 NOTE — Progress Notes (Signed)
NAME:  Terri Casey, MRN:  [id number], DOB:  1960-12-26, LOS: 5 ADMISSION DATE:  06/09/2019, CONSULTATION DATE:  06/09/2019 REFERRING MD:  Dr. Carles Collet, [facility], CHIEF COMPLAINT:  Short of breath   Brief History   59 yo female smoker brought to ER with dyspnea and altered mental status from acute on chronic hypoxic/hypercapnic respiratory failure from COPD exacerbation.  She required intubation in ER.  Past Medical History  COPD on 2.5 liters, HTN, HLD, Hypothyroidism, CAD, OSA, A fib  Significant Hospital Events   6/08 Admit  Consults:    Procedures:  ETT 6/08 >>   Significant Diagnostic Tests:  Echo 6/08 >> EF 50 to 55%, global hypokinesis, mild LVH, severe RV systolic dysfunction  Micro Data:  SARS CoV2 PCR 6/08 >> negative Blood 6/08 >>  Sputum 6/08 >> Pseudomonas  Antimicrobials:  Doxycycline 6/08 >> 6/10 Cefepime 6/10 >>   Interim history/subjective:  Awake and interactive Extubated 06/13/18/2021, requiring Precedex  Objective   Blood pressure (!) 175/95, pulse 99, temperature 98.4 F (36.9 C), temperature source Axillary, resp. rate 15, height 5\' 9"  (1.753 m), weight (!) 138.6 kg, SpO2 97 %.    FiO2 (%):  [40 %] 40 %   Intake/Output Summary (Last 24 hours) at 06/14/2019 1445 Last data filed at 06/14/2019 0400 Gross per 24 hour  Intake 400.48 ml  Output 675 ml  Net -274.52 ml   Filed Weights   06/11/19 0500 06/12/19 0427 06/13/19 0417  Weight: (!) 139 kg (!) 141 kg (!) 138.6 kg    Examination: General:  Overweight female in NAD on vent Neuro: Awake and alert, interactive HEENT:  [place]/AT, No JVD noted, PERRL Cardiovascular:  RRR, no MRG Lungs: Does have some mild rhonchi Abdomen:  Soft, non-distended, non-tender.  Musculoskeletal:  No acute deformity. No edema.  Skin:  Intact, MMM   Resolved Hospital Problem list   Hypotension from sedation  Assessment & Plan:   Acute on chronic hypoxic/hypercapnic respiratory failure from COPD exacerbation, sleep  disordered breathing, and on going tobacco abuse Left sided pleural effusion Pseudomonal tracheobronchitis -We will wean as tolerated - goal SpO2 88 to 95% - Cefepime-day 4 - continue pulmicort, duoneb - continue solumedrol 40mg  BID -Switch to prednisone in a.m.  Acute metabolic encephalopathy from hypercapnia. Hx of chronic pain, depression. -On Precedex, will wean  Steroid induced hyperglycemia. - SSI  Hx of HTN, HLD, a fib present prior to admission. - continue lipitor, xarelto  Hx of hypothyroidism. TSH 32 on admission - Continue synthroid incrased dose to 249mcg from 144mcg at home.   Labs unremarkable today Bibasal atelectasis, reviewed by myself Wean off Precedex  Best practice:  Diet: Orally as tolerated DVT prophylaxis: xarelto GI prophylaxis: protonix Mobility: bed rest Code Status: full code  Labs:   CMP Latest Ref Rng & Units 06/14/2019 06/13/2019 06/13/2019  Glucose 70 - 99 mg/dL 103(H) - 129(H)  BUN 6 - 20 mg/dL 24(H) - 34(H)  Creatinine 0.44 - 1.00 mg/dL 0.66 - 0.70  Sodium 135 - 145 mmol/L 140 139 138  Potassium 3.5 - 5.1 mmol/L 4.9 3.9 4.4  Chloride 98 - 111 mmol/L 97(L) - 97(L)  CO2 22 - 32 mmol/L 31 - 32  Calcium 8.9 - 10.3 mg/dL 9.3 - 8.9  Total Protein 6.5 - 8.1 g/dL 6.4(L) - 5.7(L)  Total Bilirubin 0.3 - 1.2 mg/dL 0.9 - 0.5  Alkaline Phos 38 - 126 U/L 77 - 67  AST 15 - 41 U/L 22 - 17  ALT 0 -  44 U/L 16 - 14    CBC Latest Ref Rng & Units 06/13/2019 06/13/2019 06/12/2019  WBC 4.0 - 10.5 K/uL - 9.9 10.7(H)  Hemoglobin 12.0 - 15.0 g/dL 12.6 12.2 12.9  Hematocrit 36 - 46 % 37.0 38.9 41.4  Platelets 150 - 400 K/uL - 115(L) PLATELET CLUMPS NOTED ON SMEAR, COUNT APPEARS ADEQUATE    ABG    Component Value Date/Time   PHART 7.410 06/13/2019 1026   PCO2ART 59.3 (H) 06/13/2019 1026   PO2ART 74 (L) 06/13/2019 1026   HCO3 37.7 (H) 06/13/2019 1026   TCO2 40 (H) 06/13/2019 1026   O2SAT 95.0 06/13/2019 1026    CBG (last 3)  Recent Labs     06/14/19 0359 06/14/19 0804 06/14/19 1158  GLUCAP 109* 111* 93    The patient is critically ill with multiple organ systems failure and requires high complexity decision making for assessment and support, frequent evaluation and titration of therapies, application of advanced monitoring technologies and extensive interpretation of multiple databases. Critical Care Time devoted to patient care services described in this note independent of APP/resident time (if applicable)  is 30 minutes.   Sherrilyn Rist MD [facility] Personal pager: [phone] If unanswered, please page CCM On-call: [phone]

## 2019-06-14 NOTE — Progress Notes (Signed)
Pt placed on bipap for the night, pt states it makes her very uncomfortable, and does not like using it but has agreed to do it.

## 2019-06-14 NOTE — Progress Notes (Signed)
Pts son CJ and daughter in law [name] took pts bag of medicine/belongins home with them.  Irven [place], RN

## 2019-06-14 NOTE — Evaluation (Signed)
Clinical/Bedside Swallow Evaluation Patient Details  Name: Terri Casey MRN: [id number] Date of Birth: July 06, 1960  Today's Date: 06/14/2019 Time: SLP Start Time (ACUTE ONLY): 1059 SLP Stop Time (ACUTE ONLY): 1115 SLP Time Calculation (min) (ACUTE ONLY): 16 min  Past Medical History:  Past Medical History:  Diagnosis Date  . Anxiety   . Asthma   . COPD (chronic obstructive pulmonary disease) ([place])   . Diabetes mellitus without complication ([place])   . Hypertension   . Thyroid disease    Past Surgical History:  Past Surgical History:  Procedure Laterality Date  . ABDOMINAL HYSTERECTOMY     HPI:  Pt is a 59 y.o. female with PMH COPD on 2.5 liters, HTN, HLD, Hypothyroidism, CAD, OSA, A fib who was brought to ED with dyspnea and altered mental status from acute on chronic hypoxic/hypercapnic respiratory failure from COPD exacerbation. CXR: Bibasilar atelectasis. No edema or airspace opacity. ETT 6/8-6/12. Shanon Brow, RN reported that the pt has passed the [facility] and was started on full liquids    Assessment / Plan / Recommendation Clinical Impression  Pt was seen for bedside swallow evaluation and she denied a history of dysphagia. Oral mechanism exam was [facility]. She was edentulous and stated that her dentures are not at the hospital. She indicated that she does not typically eat without her dentures and that food would have to be "pretty soft" for her to be able to. She tolerated all solids and liquids without signs or symptoms of aspiration. Mastication of dysphagia 2 solids was [facility] considering her edentulous status and no significant oral residue was noted. A dysphagia 2 diet with thin liquids is recommended at this this and SLP will follow to assess safety of the current diet as well as her ability to tolerate more advanced solids.  SLP Visit Diagnosis: Dysphagia, oral phase (R13.11)    Aspiration Risk  No limitations    Diet Recommendation Dysphagia 2 (Fine chop);Thin liquid   Liquid  Administration via: Cup;Straw Medication Administration: Whole meds with liquid Supervision: Staff to assist with self feeding Compensations: Slow rate;Small sips/bites Postural Changes: Seated upright at 90 degrees    Other  Recommendations Oral Care Recommendations: Oral care BID   Follow up Recommendations None      Frequency and Duration min 2x/week  2 weeks       Prognosis Prognosis for Safe Diet Advancement: Good      Swallow Study   General Date of Onset: 06/13/19 HPI: Pt is a 59 y.o. female with PMH COPD on 2.5 liters, HTN, HLD, Hypothyroidism, CAD, OSA, A fib who was brought to ED with dyspnea and altered mental status from acute on chronic hypoxic/hypercapnic respiratory failure from COPD exacerbation. CXR: Bibasilar atelectasis. No edema or airspace opacity. ETT 6/8-6/12. Shanon Brow, RN reported that the pt has passed the [facility] and was started on full liquids  Type of Study: Bedside Swallow Evaluation Previous Swallow Assessment: None Diet Prior to this Study: Thin liquids (full liquids) Temperature Spikes Noted: No Respiratory Status: Nasal cannula History of Recent Intubation: Yes Length of Intubations (days): 4 days Date extubated: 06/13/19 Behavior/Cognition: Alert;Cooperative;Pleasant mood;Requires cueing Oral Cavity Assessment: Within Functional Limits Oral Care Completed by SLP: No Oral Cavity - Dentition: Edentulous Vision: Functional for self-feeding Self-Feeding Abilities: Able to feed self Patient Positioning: Upright in bed;Postural control adequate for testing Baseline Vocal Quality: Hoarse (At baseline per pt 2/2 thryoid issue) Volitional Cough: Weak Volitional Swallow: Able to elicit    Oral/Motor/Sensory Function Overall Oral Motor/Sensory Function: Within  functional limits   Ice Chips Ice chips: Within functional limits Presentation: Spoon   Thin Liquid Thin Liquid: Within functional limits Presentation: Straw    Nectar Thick Nectar Thick Liquid:  Not tested   Honey Thick Honey Thick Liquid: Not tested   Puree Puree: Within functional limits Presentation: Spoon   Solid     Solid: Within functional limits Presentation: Coy I. Hardin Negus, [place], [place] Office number [phone] Pager [phone]  [facility] 06/14/2019,11:30 AM

## 2019-06-15 LAB — COMPREHENSIVE METABOLIC PANEL
ALT: 16 U/L (ref 0–44)
AST: 24 U/L (ref 15–41)
Albumin: 3.1 g/dL — ABNORMAL LOW (ref 3.5–5.0)
Alkaline Phosphatase: 78 U/L (ref 38–126)
Anion gap: 14 (ref 5–15)
BUN: 24 mg/dL — ABNORMAL HIGH (ref 6–20)
CO2: 28 mmol/L (ref 22–32)
Calcium: 9.1 mg/dL (ref 8.9–10.3)
Chloride: 99 mmol/L (ref 98–111)
Creatinine, Ser: 0.81 mg/dL (ref 0.44–1.00)
GFR calc Af Amer: >60 mL/min (ref >60)
GFR calc non Af Amer: >60 mL/min (ref >60)
Glucose, Bld: 94 mg/dL (ref 70–99)
Potassium: 5.6 mmol/L — ABNORMAL HIGH (ref 3.5–5.1)
Sodium: 141 mmol/L (ref 135–145)
Total Bilirubin: 0.8 mg/dL (ref 0.3–1.2)
Total Protein: 6.5 g/dL (ref 6.5–8.1)

## 2019-06-15 LAB — GLUCOSE, CAPILLARY
Glucose-Capillary: 103 mg/dL — ABNORMAL HIGH (ref 70–99)
Glucose-Capillary: 103 mg/dL — ABNORMAL HIGH (ref 70–99)
Glucose-Capillary: 117 mg/dL — ABNORMAL HIGH (ref 70–99)
Glucose-Capillary: 86 mg/dL (ref 70–99)
Glucose-Capillary: 92 mg/dL (ref 70–99)
Glucose-Capillary: 99 mg/dL (ref 70–99)

## 2019-06-15 LAB — CULTURE, BLOOD (ROUTINE X 2)
Culture: NO GROWTH
Culture: NO GROWTH
Special Requests: ADEQUATE

## 2019-06-15 MED ORDER — AMLODIPINE BESYLATE 10 MG PO TABS
10.0000 mg | ORAL_TABLET | Freq: Every day | ORAL | Status: DC
  Administered 2019-06-15 – 2019-06-19 (×5): 10 mg via ORAL
  Filled 2019-06-15 (×5): qty 1

## 2019-06-15 MED ORDER — ENSURE ENLIVE PO LIQD
237.0000 mL | Freq: Three times a day (TID) | ORAL | Status: DC
  Administered 2019-06-15 – 2019-06-19 (×12): 237 mL via ORAL

## 2019-06-15 MED ORDER — RIVAROXABAN 20 MG PO TABS
20.0000 mg | ORAL_TABLET | Freq: Every day | ORAL | Status: DC
  Administered 2019-06-15 – 2019-06-18 (×4): 20 mg via ORAL
  Filled 2019-06-15 (×4): qty 1

## 2019-06-15 NOTE — Progress Notes (Signed)
[facility] Progress Note Patient Name: XYLA LEISNER DOB: 1960-07-13 MRN: [id number]   Date of Service  06/15/2019  HPI/Events of Note  K increased from 4.9 to 5.6 on labs this AM. No clear reason for this. Renal function remains good. Doubt acidosis (she has been on BiPAP all night and when I camera checked her just now she is very clear / alert and oriented, giving me the thumbs up sign when I ask her to).  Suspect there may be an element of hemolysis.   eICU Interventions  No action at this time. It may be reasonable for the day team to repeat this lab later in the day to ensure the level is improved, but I do not think it warrants repeating immediately. She is a lab draw (no central venous access).      Intervention Category Intermediate Interventions: Electrolyte abnormality - evaluation and management  Charlott Rakes 06/15/2019, 6:23 AM

## 2019-06-15 NOTE — Progress Notes (Signed)
[facility] ICU Pick up Note  58 yo F with COPD and chronic hypoxic respiratory failure on 2L at home, also VTE on Xarelto HTN, and hypothyroidism who presented with respiratory failure, required intubation in the ER.  Found to have Pseudomonas pneumonia.  On day 4 cefepime.    Intubated 6/8 Extubated 6/12, now interactive, remains on 8L HFNC.  Sleeps with BiPAP chronically.  Post-extubation complicated by delirium, now off Precedex and interactive.  Should have 7 days tx for pseudomonas.    Transfer [facility] today, [facility] to pick up tomorrow, thank you.

## 2019-06-15 NOTE — Progress Notes (Signed)
NAME:  Terri Casey, MRN:  [id number], DOB:  1960-04-01, LOS: 6 ADMISSION DATE:  06/09/2019, CONSULTATION DATE:  06/09/2019 REFERRING MD:  Dr. Carles Collet, [facility], CHIEF COMPLAINT:  Short of breath   Brief History   59 yo female smoker brought to ER with dyspnea and altered mental status from acute on chronic hypoxic/hypercapnic respiratory failure from COPD exacerbation.  She required intubation in ER.  Past Medical History  COPD on 2.5 liters, HTN, HLD, Hypothyroidism, CAD, OSA, A fib  Significant Hospital Events   6/08 Admit  Consults:    Procedures:  ETT 6/08 >>   Significant Diagnostic Tests:  Echo 6/08 >> EF 50 to 55%, global hypokinesis, mild LVH, severe RV systolic dysfunction  Micro Data:  SARS CoV2 PCR 6/08 >> negative Blood 6/08 >>  Sputum 6/08 >> Pseudomonas  Antimicrobials:  Doxycycline 6/08 >> 6/10 Cefepime 6/10 >>   Interim history/subjective:  Awake and interactive Extubated 06/13/18/2021. Remains on HFNC. Slept with BiPAP - chronic from home.   Objective   Blood pressure (!) 176/96, pulse 93, temperature 98.1 F (36.7 C), temperature source Oral, resp. rate 19, height 5\' 9"  (1.753 m), weight (!) 138.6 kg, SpO2 98 %.        Intake/Output Summary (Last 24 hours) at 06/15/2019 0817 Last data filed at 06/15/2019 0800 Gross per 24 hour  Intake 400.07 ml  Output 1525 ml  Net -1124.93 ml   Filed Weights   06/11/19 0500 06/12/19 0427 06/13/19 0417  Weight: (!) 139 kg (!) 141 kg (!) 138.6 kg    Examination: General:  Overweight female in NAD on vent Neuro: Awake and alert, interactive HEENT:  [place]/AT, No JVD noted, PERRL Cardiovascular:  RRR, no MRG Lungs: Does have some mild rhonchi Abdomen:  Soft, non-distended, non-tender.  Musculoskeletal:  No acute deformity. No edema.  Skin:  Intact, MMM   Resolved Hospital Problem list   Hypotension from sedation  Assessment & Plan:   Acute on chronic hypoxic/hypercapnic respiratory failure from COPD  exacerbation, sleep disordered breathing, and on going tobacco abuse Left sided pleural effusion Pseudomonal tracheobronchitis - Wean O2 to goal SpO2 88 to 95% - Cefepime-day 4 - continue pulmicort, duoneb - On short steroid course   Acute metabolic encephalopathy from hypercapnia. Hx of chronic pain, depression. -now off Precedex.   Steroid induced hyperglycemia. - SSI  Hx of HTN, HLD, a fib present prior to admission. - continue lipitor, xarelto  Hx of hypothyroidism. TSH 32 on admission - Continue synthroid incrased dose to 223mcg from 128mcg at home.   Poorly controlled hypertension. - Increased amlodipine.  Best practice:  Diet: Orally as tolerated DVT prophylaxis: resume home Xarelto GI prophylaxis: protonix Mobility: progressive ambulation. Code Status: full code Disposition: ready for transfer.  Labs:   CMP Latest Ref Rng & Units 06/15/2019 06/14/2019 06/13/2019  Glucose 70 - 99 mg/dL 94 103(H) -  BUN 6 - 20 mg/dL 24(H) 24(H) -  Creatinine 0.44 - 1.00 mg/dL 0.81 0.66 -  Sodium 135 - 145 mmol/L 141 140 139  Potassium 3.5 - 5.1 mmol/L 5.6(H) 4.9 3.9  Chloride 98 - 111 mmol/L 99 97(L) -  CO2 22 - 32 mmol/L 28 31 -  Calcium 8.9 - 10.3 mg/dL 9.1 9.3 -  Total Protein 6.5 - 8.1 g/dL 6.5 6.4(L) -  Total Bilirubin 0.3 - 1.2 mg/dL 0.8 0.9 -  Alkaline Phos 38 - 126 U/L 78 77 -  AST 15 - 41 U/L 24 22 -  ALT 0 -  44 U/L 16 16 -    CBC Latest Ref Rng & Units 06/13/2019 06/13/2019 06/12/2019  WBC 4.0 - 10.5 K/uL - 9.9 10.7(H)  Hemoglobin 12.0 - 15.0 g/dL 12.6 12.2 12.9  Hematocrit 36 - 46 % 37.0 38.9 41.4  Platelets 150 - 400 K/uL - 115(L) PLATELET CLUMPS NOTED ON SMEAR, COUNT APPEARS ADEQUATE    ABG    Component Value Date/Time   PHART 7.410 06/13/2019 1026   PCO2ART 59.3 (H) 06/13/2019 1026   PO2ART 74 (L) 06/13/2019 1026   HCO3 37.7 (H) 06/13/2019 1026   TCO2 40 (H) 06/13/2019 1026   O2SAT 95.0 06/13/2019 1026    CBG (last 3)  Recent Labs    06/14/19 1942  06/14/19 2343 06/15/19 [place], MD [facility] ICU Physician [place]  Pager: [phone] Mobile: [phone] After hours: [phone].  06/15/2019, 8:26 AM

## 2019-06-15 NOTE — Progress Notes (Signed)
Nutrition Follow-up  DOCUMENTATION CODES:   Morbid obesity  INTERVENTION:   - Ensure Enlive po TID, each supplement provides 350 kcal and 20 grams of protein  - Encourage adequate PO intake  NUTRITION DIAGNOSIS:   Inadequate oral intake related to inability to eat as evidenced by NPO status.  Progressing, pt now on dysphagia 2 diet  GOAL:   Patient will meet greater than or equal to 90% of their needs  Progressing  MONITOR:   PO intake, Supplement acceptance, Diet advancement, Labs, Weight trends, I & O's  REASON FOR ASSESSMENT:   Consult Enteral/tube feeding initiation and management  ASSESSMENT:   59 year old female with past medical history of COPD, chronic respiratory failure on 2.5 L oxygen, HTN, HLD, hypothyroidism, morbid obesity, nonobstructive CAD, OSA presenting with 2-3 day history of worsening shortness of breath, upon ED arrival pt on CPAP noted more somnolent, minimally responsive and intubated for airway protection.  6/08 - admit, intubated 6/10 - extubated, reintubated 6/12 - extubated 6/13 - diet advanced to dysphagia 2, thin liquids  Discussed pt with RN and during ICU rounds.  Pt continues to have mild pitting generalized edema. EDW: 125.6 kg  Spoke with pt and family at bedside. RN in room providing nursing care. Pt reports having some nausea today and feeling bloated/gassy. Pt states that she ate some ice cream and drank some juice yesterday. She believes this gave her indigestion.  RD encouraged PO intake and discussed eating slowly and chewing food well. Pt amenable to receiving oral nutrition supplements. RD to order Ensure Enlive.  Meal Completion: 25%  Medications reviewed and include: colace, SSI q 4 hours, protonix, miralax, prednisone  Labs reviewed: potassium 5.6 CBG's: 86-114 x 24 hours  UOP: 1450 ml x 24 hours I/O's: +2.1 L since admit  Diet Order:   Diet Order            DIET DYS 2 Room service appropriate? Yes with  Assist; Fluid consistency: Thin  Diet effective now                 EDUCATION NEEDS:   Education needs have been addressed  Skin:  Skin Assessment: Reviewed RN Assessment  Last BM:  no documented BM  Height:   Ht Readings from Last 1 Encounters:  06/11/19 5\' 9"  (1.753 m)    Weight:   Wt Readings from Last 1 Encounters:  06/13/19 (!) 138.6 kg    Ideal Body Weight:  65.9 kg  BMI:  Body mass index is 45.12 kg/m.  Estimated Nutritional Needs:   Kcal:  1900-2100  Protein:  100-115 grams  Fluid:  >/= 1.8 L    Gaynell Face, MS, RD, LDN Inpatient Clinical Dietitian Pager: [phone] Weekend/After Hours: [phone]

## 2019-06-16 DIAGNOSIS — E039 Hypothyroidism, unspecified: Secondary | ICD-10-CM

## 2019-06-16 DIAGNOSIS — E782 Mixed hyperlipidemia: Secondary | ICD-10-CM

## 2019-06-16 DIAGNOSIS — I4891 Unspecified atrial fibrillation: Secondary | ICD-10-CM

## 2019-06-16 DIAGNOSIS — G9341 Metabolic encephalopathy: Secondary | ICD-10-CM

## 2019-06-16 LAB — GLUCOSE, CAPILLARY
Glucose-Capillary: 112 mg/dL — ABNORMAL HIGH (ref 70–99)
Glucose-Capillary: 70 mg/dL (ref 70–99)
Glucose-Capillary: 78 mg/dL (ref 70–99)
Glucose-Capillary: 92 mg/dL (ref 70–99)
Glucose-Capillary: 96 mg/dL (ref 70–99)

## 2019-06-16 MED ORDER — SODIUM POLYSTYRENE SULFONATE 15 GM/60ML PO SUSP
30.0000 g | Freq: Once | ORAL | Status: AC
  Administered 2019-06-16: 30 g via ORAL
  Filled 2019-06-16: qty 120

## 2019-06-16 MED ORDER — LEVOTHYROXINE SODIUM 100 MCG PO TABS
200.0000 ug | ORAL_TABLET | Freq: Every day | ORAL | Status: DC
  Administered 2019-06-16 – 2019-06-19 (×4): 200 ug via ORAL
  Filled 2019-06-16 (×3): qty 2
  Filled 2019-06-16: qty 8

## 2019-06-16 MED ORDER — SALINE SPRAY 0.65 % NA SOLN
1.0000 | NASAL | Status: DC | PRN
  Filled 2019-06-16: qty 44

## 2019-06-16 MED ORDER — CHLORHEXIDINE GLUCONATE CLOTH 2 % EX PADS
6.0000 | MEDICATED_PAD | Freq: Every day | CUTANEOUS | Status: DC
  Administered 2019-06-16 – 2019-06-19 (×4): 6 via TOPICAL

## 2019-06-16 MED FILL — Midazolam HCl Inj 5 MG/5ML (Base Equivalent): INTRAMUSCULAR | Qty: 5 | Status: AC

## 2019-06-16 MED FILL — Fentanyl Citrate Preservative Free (PF) Inj 100 MCG/2ML: INTRAMUSCULAR | Qty: 2 | Status: AC

## 2019-06-16 NOTE — Progress Notes (Signed)
  Speech Language Pathology Treatment: Dysphagia  Patient Details Name: Terri Casey MRN: [id number] DOB: November 02, 1960 Today's Date: 06/16/2019 Time: 0370-4888 SLP Time Calculation (min) (ACUTE ONLY): 8 min  Assessment / Plan / Recommendation Clinical Impression  Pt now has denture plates in hospital.  Pt wearing both upper and lower plates on SLP arrival.  Pt reports decreased appetite, but would like to advance solid texture now that she has her dentures.  Pt tolerated thin liquid and regular solid without any clinical s/s of aspiration, including serial sips of thin liquid by straw, and exhibited excellent oral clearance of solids.  Pt reports some difficulty with self feeding 2/2 arm/hand tremors and may need some assistance with self feeding. Consider OT referral to address feeding if different from baseline. Recomment advancing pt to regular solid texture diet with thin liquids.  SLP to follow up for diet tolerance.   HPI HPI: Pt is a 59 y.o. female with PMH COPD on 2.5 liters, HTN, HLD, Hypothyroidism, CAD, OSA, A fib who was brought to ED with dyspnea and altered mental status from acute on chronic hypoxic/hypercapnic respiratory failure from COPD exacerbation. CXR: Bibasilar atelectasis. No edema or airspace opacity. ETT 6/8-6/12.      SLP Plan  Continue with current plan of care       Recommendations  Diet recommendations: Regular;Thin liquid Liquids provided via: Cup;Straw Medication Administration: Whole meds with liquid Supervision: Staff to assist with self feeding Compensations: Slow rate;Small sips/bites Postural Changes and/or Swallow Maneuvers: Seated upright 90 degrees                Oral Care Recommendations: Oral care BID Follow up Recommendations: None SLP Visit Diagnosis: Dysphagia, oral phase (R13.11) Plan: Continue with current plan of care       [place], [place], [place] Office:  [phone]  06/16/2019, 11:13 AM

## 2019-06-16 NOTE — Progress Notes (Signed)
PROGRESS NOTE    Terri Casey  NTI:[id number] DOB: 07-11-1960 DOA: 06/09/2019 PCP: Patient, No Pcp Per    Brief Narrative:  59 yo F with COPD and chronic hypoxic respiratory failure on 2L at home, also VTE on Xarelto HTN, and hypothyroidism ,smoker brought to ER with dyspnea and altered mental status from acute on chronic hypoxic/hypercapnic respiratory failure from COPD exacerbation.  She required intubation in ER.  Found to have Pseudomonas pneumonia.   Intubated 6/8 Extubated 6/12, now interactive, remains on 8L HFNC.  Sleeps with BiPAP chronically. Post-extubation complicated by delirium, now off Precedex and interactive.  Should have 7 days tx for pseudomonas.   Transferred out for  [facility] to pick up  On 6/15    Consultants:   pccm  Procedures: s/p extubation 6/12, requiring precedex. Echo 6/08 >> EF 50 to 55%, global hypokinesis, mild LVH, severe RV systolic dysfunction SARS CoV2 PCR 6/08 >> negative Sputum 6/08 >> Pseudomonas  Antimicrobials:  Doxycycline 6/08 >> 6/10 Cefepime 6/10 >>    Subjective: Denies sob, cp, dizziness, abd pain.   Objective: Vitals:   06/16/19 0500 06/16/19 0600 06/16/19 0700 06/16/19 0807  BP: 120/61 (!) 109/59 (!) 86/66   Pulse: 87 88 83   Resp: (!) 27 (!) 0 (!) 23   Temp:   97.8 F (36.6 C)   TempSrc:   Oral   SpO2: 92% 100% 100% 98%  Weight:      Height:        Intake/Output Summary (Last 24 hours) at 06/16/2019 0809 Last data filed at 06/16/2019 0600 Gross per 24 hour  Intake 240 ml  Output 850 ml  Net -610 ml   Filed Weights   06/11/19 0500 06/12/19 0427 06/13/19 0417  Weight: (!) 139 kg (!) 141 kg (!) 138.6 kg    Examination:  General exam: Appears calm and comfortable and in bed Respiratory system: Clear to auscultation anteriorly. Respiratory effort poor Cardiovascular system: S1 & S2 heard, RRR. No JVD, murmurs, rubs, gallops or clicks.  Gastrointestinal system: Abdomen is nondistended, soft and nontender.   Normal bowel sounds heard. Central nervous system: Awake alert, grossly intact Extremities: Mild lower extremity edema bilaterally Skin: Warm dry Psychiatry:  Mood & affect appropriate in current setting.     Data Reviewed: I have personally reviewed following labs and imaging studies  CBC: Recent Labs  Lab 06/09/19 0935 06/09/19 0935 06/10/19 0402 06/10/19 0402 06/11/19 0402 06/11/19 0402 06/11/19 2037 06/11/19 2353 06/12/19 0807 06/13/19 0258 06/13/19 1026  WBC 7.7  --  7.8  --  10.4  --   --   --  10.7* 9.9  --   NEUTROABS  --   --   --   --  8.8*  --   --   --   --   --   --   HGB 14.2   < > 12.1   < > 11.8*   < > 13.3 13.3 12.9 12.2 12.6  HCT 49.8*   < > 40.5   < > 39.3   < > 39.0 39.0 41.4 38.9 37.0  MCV 118.9*  --  112.5*  --  109.2*  --   --   --  107.8* 106.3*  --   PLT 142*  --  125*  --  119*  --   --   --  PLATELET CLUMPS NOTED ON SMEAR, COUNT APPEARS ADEQUATE 115*  --    < > = values in this interval not displayed.  Basic Metabolic Panel: Recent Labs  Lab 06/09/19 0935 06/09/19 1355 06/10/19 0402 06/10/19 0402 06/10/19 1803 06/11/19 0402 06/11/19 2037 06/12/19 0807 06/13/19 0258 06/13/19 1026 06/14/19 1153 06/15/19 0311  NA   < >  --  139   < >  --  139   < > 139 138 139 140 141  K   < >  --  4.4   < >  --  4.6   < > 4.7 4.4 3.9 4.9 5.6*  CL   < >  --  95*   < >  --  97*  --  96* 97*  --  97* 99  CO2   < >  --  34*   < >  --  35*  --  33* 32  --  31 28  GLUCOSE   < >  --  136*   < >  --  121*  --  155* 129*  --  103* 94  BUN   < >  --  24*   < >  --  42*  --  35* 34*  --  24* 24*  CREATININE   < >  --  1.35*   < >  --  1.00  --  0.94 0.70  --  0.66 0.81  CALCIUM   < >  --  9.2   < >  --  8.8*  --  8.9 8.9  --  9.3 9.1  MG  --  2.3  2.3 2.2  --  2.2  --   --  2.2  --   --   --   --   PHOS  --  3.2  3.2 1.6*  --  2.7  --   --  3.7  --   --   --   --    < > = values in this interval not displayed.   GFR: Estimated Creatinine Clearance: 111  mL/min (by C-G formula based on SCr of 0.81 mg/dL). Liver Function Tests: Recent Labs  Lab 06/10/19 0402 06/11/19 0402 06/13/19 0258 06/14/19 1153 06/15/19 0311  AST 20 23 17 22 24   ALT 11 9 14 16 16   ALKPHOS 105 86 67 77 78  BILITOT 0.4 0.4 0.5 0.9 0.8  PROT 6.3* 6.0* 5.7* 6.4* 6.5  ALBUMIN 3.2* 3.1* 2.7* 3.0* 3.1*   No results for input(s): LIPASE, AMYLASE in the last 168 hours. No results for input(s): AMMONIA in the last 168 hours. Coagulation Profile: No results for input(s): INR, PROTIME in the last 168 hours. Cardiac Enzymes: No results for input(s): CKTOTAL, CKMB, CKMBINDEX, TROPONINI in the last 168 hours. BNP (last 3 results) No results for input(s): PROBNP in the last 8760 hours. HbA1C: No results for input(s): HGBA1C in the last 72 hours. CBG: Recent Labs  Lab 06/15/19 1506 06/15/19 1947 06/15/19 2332 06/16/19 0338 06/16/19 0747  GLUCAP 117* 103* 92 78 70   Lipid Profile: Recent Labs    06/14/19 1153  TRIG 216*   Thyroid Function Tests: No results for input(s): TSH, T4TOTAL, FREET4, T3FREE, THYROIDAB in the last 72 hours. Anemia Panel: No results for input(s): VITAMINB12, FOLATE, FERRITIN, TIBC, IRON, RETICCTPCT in the last 72 hours. Sepsis Labs: Recent Labs  Lab 06/09/19 0935 06/09/19 1355  PROCALCITON  --  <0.10  LATICACIDVEN 1.4  --     Recent Results (from the past 240 hour(s))  Blood culture (routine x 2)     Status: None   Collection  Time: 06/09/19  9:35 AM   Specimen: BLOOD RIGHT ARM  Result Value Ref Range Status   Specimen Description BLOOD RIGHT ARM DRAWN BY RN  Final   Special Requests   Final    BOTTLES DRAWN AEROBIC AND ANAEROBIC Blood Culture adequate volume   Culture   Final    NO GROWTH 6 DAYS Performed at [facility], [address], [place], [place] [address]    Report Status 06/15/2019 FINAL  Final  SARS Coronavirus 2 by RT PCR (hospital order, performed in [facility] hospital lab) Nasopharyngeal Nasopharyngeal Swab      Status: None   Collection Time: 06/09/19 10:28 AM   Specimen: Nasopharyngeal Swab  Result Value Ref Range Status   SARS Coronavirus 2 NEGATIVE NEGATIVE Final    Comment: (NOTE) SARS-CoV-2 target nucleic acids are NOT DETECTED. The SARS-CoV-2 RNA is generally detectable in upper and lower respiratory specimens during the acute phase of infection. The lowest concentration of SARS-CoV-2 viral copies this assay can detect is 250 copies / mL. A negative result does not preclude SARS-CoV-2 infection and should not be used as the sole basis for treatment or other patient management decisions.  A negative result may occur with improper specimen collection / handling, submission of specimen other than nasopharyngeal swab, presence of viral mutation(s) within the areas targeted by this assay, and inadequate number of viral copies (<250 copies / mL). A negative result must be combined with clinical observations, patient history, and epidemiological information. Fact Sheet for Patients:   StrictlyIdeas.no Fact Sheet for Healthcare Providers: BankingDealers.co.za This test is not yet approved or cleared  by the [place] FDA and has been authorized for detection and/or diagnosis of SARS-CoV-2 by FDA under an Emergency Use Authorization (EUA).  This EUA will remain in effect (meaning this test can be used) for the duration of the COVID-19 declaration under Section 564(b)(1) of the Act, 21 U.S.C. section 360bbb-3(b)(1), unless the authorization is terminated or revoked sooner. Performed at [facility], [address], [place], [place] [address]   Urine culture     Status: Abnormal   Collection Time: 06/09/19 10:29 AM   Specimen: Urine, Catheterized  Result Value Ref Range Status   Specimen Description   Final    URINE, CATHETERIZED Performed at [facility], [address], [place], [place] [address]    Special Requests   Final     NONE Performed at [facility], [address], [place], [place] [address]    Culture (A)  Final    60,000 COLONIES/mL AEROCOCCUS URINAE Standardized susceptibility testing for this organism is not available. Performed at [facility], [place] [address], [place], [place] [address]    Report Status 06/12/2019 FINAL  Final  Blood culture (routine x 2)     Status: None   Collection Time: 06/09/19 10:45 AM   Specimen: BLOOD RIGHT HAND  Result Value Ref Range Status   Specimen Description BLOOD RIGHT HAND  Final   Special Requests   Final    BOTTLES DRAWN AEROBIC AND ANAEROBIC Blood Culture results may not be optimal due to an inadequate volume of blood received in culture bottles   Culture   Final    NO GROWTH 6 DAYS Performed at [facility], [address] [place], [place] [address]    Report Status 06/15/2019 FINAL  Final  Culture, respiratory (non-expectorated)     Status: None   Collection Time: 06/09/19  1:33 PM   Specimen: Tracheal Aspirate; Respiratory  Result Value Ref Range Status   Specimen Description   Final    TRACHEAL ASPIRATE Performed at [facility], [address], [place], [place] [address]    Special Requests   Final    NONE Performed at [facility], [address], [place], [place] [address]    Gram Stain   Final    NO WBC SEEN ABUNDANT GRAM NEGATIVE RODS RARE GRAM POSITIVE COCCI Performed at [facility], [place] [address], [place], [place] [address]    Culture ABUNDANT PSEUDOMONAS AERUGINOSA  Final   Report Status 06/13/2019 FINAL  Final   Organism ID, Bacteria PSEUDOMONAS AERUGINOSA  Final      Susceptibility   Pseudomonas aeruginosa - MIC*    CEFTAZIDIME 4 SENSITIVE Sensitive     CIPROFLOXACIN <=0.25 SENSITIVE Sensitive     GENTAMICIN 2 SENSITIVE Sensitive     IMIPENEM 2 SENSITIVE Sensitive     PIP/TAZO 8 SENSITIVE Sensitive     CEFEPIME 4 SENSITIVE Sensitive     * ABUNDANT PSEUDOMONAS AERUGINOSA  MRSA PCR Screening     Status: None    Collection Time: 06/09/19  5:36 PM   Specimen: Nasal Mucosa; Nasopharyngeal  Result Value Ref Range Status   MRSA by PCR NEGATIVE NEGATIVE Final    Comment:        The GeneXpert MRSA Assay (FDA approved for NASAL specimens only), is one component of a comprehensive MRSA colonization surveillance program. It is not intended to diagnose MRSA infection nor to guide or monitor treatment for MRSA infections. Performed at [facility], [address], [place], [place] [address]          Radiology Studies: No results found.      Scheduled Meds: . amLODipine  10 mg Oral Daily  . atorvastatin  20 mg Oral QHS  . bethanechol  10 mg Oral TID  . budesonide (PULMICORT) nebulizer solution  0.5 mg Nebulization BID  . Chlorhexidine Gluconate Cloth  6 each Topical Q0600  . cyclobenzaprine  5 mg Oral TID  . docusate sodium  100 mg Oral BID  . escitalopram  10 mg Oral Daily  . feeding supplement (ENSURE ENLIVE)  237 mL Oral TID BM  . insulin aspart  0-20 Units Subcutaneous Q4H  . ipratropium-albuterol  3 mL Nebulization TID  . levothyroxine  200 mcg Oral Q0600  . loratadine  10 mg Oral Daily  . pantoprazole  40 mg Oral Daily  . polyethylene glycol  17 g Oral Daily  . predniSONE  30 mg Oral Q breakfast  . rivaroxaban  20 mg Oral Q supper  . topiramate  25 mg Oral Daily   Continuous Infusions: . sodium chloride Stopped (06/11/19 1741)    Assessment & Plan:   Active Problems:   Hyperlipidemia   Acute on chronic respiratory failure with hypoxia and hypercapnia (HCC)   Obesity, Class III, BMI 40-49.9 (morbid obesity) (HCC)   Thrombocytopenia (HCC)   Acute on chronic hypoxic/hypercapnic respiratory failure from COPD exacerbation, sleep disordered breathing, and on going tobacco abuse Left sided pleural effusion Pseudomonal tracheobronchitis -on bipap at night and [place] during day, will try to wean - goal SpO2 88 to 95% - Cefepime-day 5 - continue pulmicort, duoneb -switched  from iv steroid to po prednisone short course   Acute metabolic encephalopathy from hypercapnia. Hx of chronic pain, depression. -On Precedex, now off. Continue to monitor  Steroid induced hyperglycemia. - SSI  Hx of HTN, HLD, a fib present prior to admission. - bp controlled. continue lipitor, xarelto  Hx of hypothyroidism. TSH 32 on admission - Continue synthroid increased dose to 215mcg from 135mcg at home. Will need recheck level in 4 weeks.  GI PPX- PPI  PT/OT  DVT prophylaxis: Xarelto Code Status: Full Family Communication: None at bedside Disposition Plan: To be determined as currently patient is medically unstable requiring IV antibiotics and bipap. Need PT/OT evaluation for possible SNF v.s. home Anticipated discharge:likely >3 days       LOS: 7 days   Time spent: 45-with more than 50% on [place], MD [facility] Pager [phone]  If 7PM-7AM, please contact night-coverage [email or web address] Password [facility] 06/16/2019, 8:09 AM

## 2019-06-16 NOTE — Progress Notes (Signed)
Attempted to call report to 2W RN. Charge RN said she will have to call back. Will give report and transfer pt upon returned phone call.

## 2019-06-17 DIAGNOSIS — R58 Hemorrhage, not elsewhere classified: Secondary | ICD-10-CM

## 2019-06-17 DIAGNOSIS — T148XXA Other injury of unspecified body region, initial encounter: Secondary | ICD-10-CM

## 2019-06-17 DIAGNOSIS — G92 Toxic encephalopathy: Secondary | ICD-10-CM

## 2019-06-17 LAB — CBC
HCT: 38.4 % (ref 36.0–46.0)
Hemoglobin: 11.6 g/dL — ABNORMAL LOW (ref 12.0–15.0)
MCH: 32.9 pg (ref 26.0–34.0)
MCHC: 30.2 g/dL (ref 30.0–36.0)
MCV: 108.8 fL — ABNORMAL HIGH (ref 80.0–100.0)
Platelets: 131 10*3/uL — ABNORMAL LOW (ref 150–400)
RBC: 3.53 MIL/uL — ABNORMAL LOW (ref 3.87–5.11)
RDW: 13.3 % (ref 11.5–15.5)
WBC: 7.9 10*3/uL (ref 4.0–10.5)
nRBC: 0 % (ref 0.0–0.2)

## 2019-06-17 LAB — BASIC METABOLIC PANEL
Anion gap: 6 (ref 5–15)
BUN: 16 mg/dL (ref 6–20)
CO2: 34 mmol/L — ABNORMAL HIGH (ref 22–32)
Calcium: 8.6 mg/dL — ABNORMAL LOW (ref 8.9–10.3)
Chloride: 98 mmol/L (ref 98–111)
Creatinine, Ser: 0.59 mg/dL (ref 0.44–1.00)
GFR calc Af Amer: >60 mL/min (ref >60)
GFR calc non Af Amer: >60 mL/min (ref >60)
Glucose, Bld: 85 mg/dL (ref 70–99)
Potassium: 4.1 mmol/L (ref 3.5–5.1)
Sodium: 138 mmol/L (ref 135–145)

## 2019-06-17 LAB — GLUCOSE, CAPILLARY
Glucose-Capillary: 76 mg/dL (ref 70–99)
Glucose-Capillary: 92 mg/dL (ref 70–99)
Glucose-Capillary: 91 mg/dL (ref 70–99)
Glucose-Capillary: 155 mg/dL — ABNORMAL HIGH (ref 70–99)
Glucose-Capillary: 114 mg/dL — ABNORMAL HIGH (ref 70–99)
Glucose-Capillary: 95 mg/dL (ref 70–99)

## 2019-06-17 MED ORDER — IPRATROPIUM-ALBUTEROL 0.5-2.5 (3) MG/3ML IN SOLN
3.0000 mL | Freq: Two times a day (BID) | RESPIRATORY_TRACT | Status: DC
  Administered 2019-06-17 – 2019-06-19 (×4): 3 mL via RESPIRATORY_TRACT
  Filled 2019-06-17 (×4): qty 3

## 2019-06-17 MED ORDER — ALBUTEROL SULFATE (2.5 MG/3ML) 0.083% IN NEBU: 2.5000 mg | INHALATION_SOLUTION | Freq: Four times a day (QID) | RESPIRATORY_TRACT | Status: DC | PRN

## 2019-06-17 NOTE — Evaluation (Signed)
Occupational Therapy Evaluation Patient Details Name: Terri Casey MRN: [id number] DOB: [birth year] Today's Date: 06/17/2019    History of Present Illness Terri Casey is a 59 y.o. female with medical history of COPD, chronic respiratory failure on 2.5 L, hypertension, hyperlipidemia, hypothyroidism, morbid obesity, nonobstructive CAD, OSA presenting with 2 to 3 days of shortness of breath.  Pt was intubated in the ED, found to have pseudomonas PNA, extubated 6/12.   Clinical Impression   PTA, pt was living at home alone with an aide who came daily for 8hours per day, her aide would assist with ADL/IADL. Pt reports she was modified independent with functional mobility and "cruised" along furniture. Pt is on 2.5lnc at baseline. Pt currently on 5lnc upon arrival, SpO2 90%-91%. Pt required 6-8lnc with functional mobility to maintain SpO2 >90%. Pt required modA to powerup from lower surfaces and minguard-minA for functional mobility at RW level. Due to decline in current level of function, pt would benefit from acute OT to address established goals to facilitate safe D/C to venue listed below. At this time, recommend HHOT follow-up. Will continue to follow acutely.     Follow Up Recommendations  Home health OT;Supervision/Assistance - 24 hour    Equipment Recommendations  3 in 1 bedside commode    Recommendations for Other Services       Precautions / Restrictions Restrictions Weight Bearing Restrictions: No      Mobility Bed Mobility Overal bed mobility: Needs Assistance Bed Mobility: Supine to Sit;Sit to Supine     Supine to sit: Min guard Sit to supine: Min guard   General bed mobility comments: increased time and effort  Transfers Overall transfer level: Needs assistance Equipment used: Rolling walker (2 wheeled) Transfers: Sit to/from Stand Sit to Stand: Mod assist         General transfer comment: modA for powerup from lower surface;minA from elevated  surface pt with safe hand placement    Balance Overall balance assessment: Needs assistance Sitting-balance support: No upper extremity supported;Single extremity supported;Feet supported Sitting balance-Leahy Scale: Fair     Standing balance support: Bilateral upper extremity supported;No upper extremity supported;During functional activity Standing balance-Leahy Scale: Fair Standing balance comment: reliant on UE support                           ADL either performed or assessed with clinical judgement   ADL Overall ADL's : Needs assistance/impaired Eating/Feeding: Set up;Sitting   Grooming: Set up;Sitting   Upper Body Bathing: Set up;Sitting   Lower Body Bathing: Minimal assistance;Sit to/from stand   Upper Body Dressing : Set up;Sitting   Lower Body Dressing: Minimal assistance;Sit to/from stand   Toilet Transfer: Min guard;RW;Ambulation   Toileting- Water quality scientist and Hygiene: Sit to/from stand;Moderate assistance Toileting - Clothing Manipulation Details (indicate cue type and reason): modA to stand from recliner and lower surface;minA to stand from elevated surface     Functional mobility during ADLs: Minimal assistance;Rolling walker General ADL Comments: pt with limited activity tolerance, SpO2 90-91% on 5lnc at rest sitting EOB;pt required 6-8lnc during functional mobility SpO2 90%-93%      Vision Patient Visual Report: No change from baseline       Perception     Praxis      Pertinent Vitals/Pain Pain Assessment: Faces Faces Pain Scale: No hurt Pain Intervention(s): Monitored during session     Hand Dominance Right   Extremity/Trunk Assessment Upper Extremity Assessment Upper Extremity Assessment: Generalized  weakness;RUE deficits/detail RUE Deficits / Details: pt reporting pain in shoulder and upper arm;bruising noted and nodules noted under bruising, pt reports she noticed this yesterday   Lower Extremity Assessment Lower  Extremity Assessment: Defer to PT evaluation   Cervical / Trunk Assessment Cervical / Trunk Assessment: Normal   Communication Communication Communication: No difficulties   Cognition Arousal/Alertness: Awake/alert Behavior During Therapy: WFL for tasks assessed/performed Overall Cognitive Status: No family/caregiver present to determine baseline cognitive functioning                                 General Comments: pt reports feeling slightly confused at times;pt requires increased time for processing information;pt oriented x4   General Comments  noted bruising on pt's Right anterior aspect of chest, lateral aspect of bicep and posterior side of upper arm, palpated nodules under bruising, RN notified    Exercises     Shoulder Instructions      Home Living Family/patient expects to be discharged to:: Private residence Living Arrangements: Alone;Children;Other relatives (4 adults and a baby) Available Help at Discharge: Family;Available 24 hours/day;Available PRN/intermittently (girlfriend help care for patient when son works) Type of Home: House Home Access: Stairs to enter Technical brewer of Steps: 3 Entrance Stairs-Rails: Left [place]: One level         Biochemist, clinical: [place]: Environmental consultant - 2 wheels;Other (comment) (TBA)          Prior Functioning/Environment Level of Independence: Needs assistance  Gait / Transfers Assistance Needed: "cruises" furniture in the home, w/c out to appt. ADL's / Homemaking Assistance Needed: assisted by son's girlfriend in some ADL's            OT Problem List: Impaired balance (sitting and/or standing);Decreased cognition;Decreased safety awareness;Cardiopulmonary status limiting activity;Pain      OT Treatment/Interventions: Self-care/ADL training;Therapeutic exercise;DME and/or AE instruction;Therapeutic activities;Patient/family education;Balance training;Cognitive  remediation/compensation    OT Goals(Current goals can be found in the care plan section) Acute Rehab OT Goals Patient Stated Goal: get back home OT Goal Formulation: With patient Time For Goal Achievement: 07/01/19 Potential to Achieve Goals: Good ADL Goals Pt Will Perform Grooming: with modified independence;standing;sitting Pt Will Perform Lower Body Dressing: with modified independence;sit to/from stand;with adaptive equipment Pt Will Transfer to Toilet: with modified independence;ambulating Additional ADL Goal #1: Pt will demonstrate use of 3 energy conservation strategies during ADL/IADL and functional mobility.  OT Frequency: Min 2X/week   Barriers to D/C:            Co-evaluation              AM-PAC OT "6 Clicks" Daily Activity     Outcome Measure Help from another person eating meals?: A Little Help from another person taking care of personal grooming?: A Little Help from another person toileting, which includes using toliet, bedpan, or urinal?: A Little Help from another person bathing (including washing, rinsing, drying)?: A Little Help from another person to put on and taking off regular upper body clothing?: A Little Help from another person to put on and taking off regular lower body clothing?: A Little 6 Click Score: 18   End of Session Equipment Utilized During Treatment: Gait belt;Rolling walker;Oxygen Nurse Communication: Mobility status  Activity Tolerance: Patient tolerated treatment well Patient left: in chair;with call bell/phone within reach;with chair alarm set  OT Visit Diagnosis: Other abnormalities of gait and mobility (R26.89);Other symptoms and  signs involving cognitive function                Time: 1018-1101 OT Time Calculation (min): 43 min Charges:  OT General Charges $OT Visit: 1 Visit OT Evaluation $OT Eval Moderate Complexity: 1 Mod OT Treatments $Self Care/Home Management : 23-37 mins  Helene Kelp OTR/L Acute Rehabilitation  Services Office: [place] 06/17/2019, 1:46 PM

## 2019-06-17 NOTE — Evaluation (Signed)
Physical Therapy Evaluation Patient Details Name: Terri Casey MRN: [id number] DOB: Dec 10, 1960 Today's Date: 06/17/2019   History of Present Illness  Terri Casey is a 59 y.o. female with medical history of COPD, chronic respiratory failure on 2.5 L, hypertension, hyperlipidemia, hypothyroidism, morbid obesity, nonobstructive CAD, OSA presenting with 2 to 3 days of shortness of breath.  Pt was intubated in the ED, found to have pseudomonas PNA, extubated 6/12.  Clinical Impression  Pt admitted with/for SOB due to PNA with intubation.  Pt needing min to min guard for basic mobility and also needing 6L of portable 02 to mobilize.Marland Kitchen  Pt currently limited functionally due to the problems listed below.  (see problems list.)  Pt will benefit from PT to maximize function and safety to be able to get home safely with available assist.     Follow Up Recommendations Home health PT;Supervision/Assistance - 24 hour    Equipment Recommendations   (TBA)    Recommendations for Other Services       Precautions / Restrictions        Mobility  Bed Mobility Overal bed mobility: Needs Assistance Bed Mobility: Supine to Sit;Sit to Supine     Supine to sit: Min guard Sit to supine: Min guard      Transfers Overall transfer level: Needs assistance Equipment used: Rolling walker (2 wheeled) Transfers: Sit to/from Stand Sit to Stand: Min assist         General transfer comment: cues for hand safety,  stability assist  Ambulation/Gait Ambulation/Gait assistance: Min assist Gait Distance (Feet): 50 Feet Assistive device: Rolling walker (2 wheeled) Gait Pattern/deviations: Step-through pattern Gait velocity: slower Gait velocity interpretation: <1.8 ft/sec, indicate of risk for recurrent falls General Gait Details: generally steady, but guarded.  Sats 92% on 6L [place], HR 94 BPM.  Stairs            Wheelchair Mobility    Modified Rankin (Stroke Patients Only)        Balance Overall balance assessment: Needs assistance Sitting-balance support: No upper extremity supported;Single extremity supported;Feet supported Sitting balance-Leahy Scale: Fair     Standing balance support: Bilateral upper extremity supported;No upper extremity supported;During functional activity Standing balance-Leahy Scale: Fair                               Pertinent Vitals/Pain Pain Assessment: Faces Faces Pain Scale: No hurt Pain Intervention(s): Monitored during session    Home Living Family/patient expects to be discharged to:: Private residence Living Arrangements: Alone;Children;Other relatives (4 adults and a baby) Available Help at Discharge: Family;Available 24 hours/day;Available PRN/intermittently (girlfriend help care for patient when son works) Type of Home: House Home Access: Stairs to enter Entrance Stairs-Rails: Left Entrance Stairs-Number of Steps: 3 Home Layout: One level Home Equipment: Environmental consultant - 2 wheels;Other (comment) (TBA)      Prior Function Level of Independence: Needs assistance   Gait / Transfers Assistance Needed: "cruises" furniture in the home, w/c out to appt.  ADL's / Homemaking Assistance Needed: assisted by son's girlfriend in some ADL's        Hand Dominance        Extremity/Trunk Assessment   Upper Extremity Assessment Upper Extremity Assessment: Defer to OT evaluation    Lower Extremity Assessment Lower Extremity Assessment: Overall WFL for tasks assessed (general proximal weakness)       Communication   Communication: No difficulties  Cognition Arousal/Alertness: Awake/alert Behavior During Therapy: [facility]  for tasks assessed/performed Overall Cognitive Status: No family/caregiver present to determine baseline cognitive functioning                                        General Comments      Exercises     Assessment/Plan    PT Assessment Patient needs continued PT services   PT Problem List Decreased strength;Decreased activity tolerance;Decreased balance;Decreased mobility;Decreased knowledge of use of DME;Cardiopulmonary status limiting activity       PT Treatment Interventions DME instruction;Gait training;Stair training;Functional mobility training;Therapeutic activities;Balance training;Patient/family education    PT Goals (Current goals can be found in the Care Plan section)  Acute Rehab PT Goals Patient Stated Goal: get back home PT Goal Formulation: With patient Time For Goal Achievement: 07/01/19 Potential to Achieve Goals: Good    Frequency Min 3X/week   Barriers to discharge        Co-evaluation               AM-PAC PT "6 Clicks" Mobility  Outcome Measure Help needed turning from your back to your side while in a flat bed without using bedrails?: A Little Help needed moving from lying on your back to sitting on the side of a flat bed without using bedrails?: A Little Help needed moving to and from a bed to a chair (including a wheelchair)?: A Little Help needed standing up from a chair using your arms (e.g., wheelchair or bedside chair)?: A Little Help needed to walk in hospital room?: A Little Help needed climbing 3-5 steps with a railing? : A Little 6 Click Score: 18    End of Session   Activity Tolerance: Patient tolerated treatment well Patient left: in bed;with call bell/phone within reach Nurse Communication: Mobility status PT Visit Diagnosis: Unsteadiness on feet (R26.81);Other abnormalities of gait and mobility (R26.89);Muscle weakness (generalized) (M62.81)    Time: 4327-6147 PT Time Calculation (min) (ACUTE ONLY): 23 min   Charges:   PT Evaluation $PT Eval Moderate Complexity: 1 Mod PT Treatments $Gait Training: 8-22 mins        06/17/2019  Ginger Carne., PT Acute Rehabilitation Services [phone]  (pager) [phone]  (office)  Tessie Fass Coreena Rubalcava 06/17/2019, 12:21 AM

## 2019-06-17 NOTE — Progress Notes (Signed)
PROGRESS NOTE  Terri Casey IRJ:[id number] DOB: 01/02/1960 DOA: 06/09/2019 PCP: Patient, No Pcp Per  Brief History   59 yo F with COPD and chronic hypoxic respiratory failure on 2L at home, also VTE on Xarelto HTN, and hypothyroidism ,smoker brought to ER with dyspnea and altered mental status from acute on chronic hypoxic/hypercapnic respiratory failure from COPD exacerbation. She required intubation in ER. Found to have Pseudomonas pneumonia.  Intubated 6/8. Extubated 6/12, now interactive, remains on 8L HFNC. Sleeps with BiPAP chronically. Post-extubation complicated by delirium, now off Precedex and interactive. Should have 7 days tx for pseudomonas. Transferred out for  [facility] to pick up  On 6/15  The patient is complaining of painful lumps on her right arm and her right chest.   Consultants  . PCCM  Procedures  . Mechanical ventilation  Antibiotics   Anti-infectives (From admission, onward)   Start     Dose/Rate Route Frequency Ordered Stop   06/11/19 1030  ceFEPIme (MAXIPIME) 2 g in sodium chloride 0.9 % 100 mL IVPB  Status:  Discontinued        2 g 200 mL/hr over 30 Minutes Intravenous Every 8 hours 06/11/19 1015 06/15/19 0830   06/09/19 1800  doxycycline (VIBRA-TABS) tablet 100 mg        100 mg Per Tube  Once 06/09/19 1725 06/09/19 1808   06/09/19 1415  doxycycline (VIBRA-TABS) tablet 100 mg  Status:  Discontinued        100 mg Per Tube Every 12 hours 06/09/19 1337 06/11/19 1015    .  Subjective  The patient is resting comfortably. She is complaining of bruises with painful small lumps in the middle of them.  Objective   Vitals:  Vitals:   06/17/19 1527 06/17/19 2008  BP: 122/76 128/60  Pulse: 91 85  Resp: 18 16  Temp: 98.6 F (37 C) 98.6 F (37 C)  SpO2: 96% 98%   Exam:  Constitutional:  . The patient is awake, alert, and oriented x 3. No acute distress. Respiratory:  . No increased work of breathing. . No wheezes, rales, or rhonchi . No tactile  fremitus Cardiovascular:  . Regular rate and rhythm . No murmurs, ectopy, or gallups. . No lateral PMI. No thrills. Abdomen:  . Abdomen is soft, non-tender, non-distended . No hernias, masses, or organomegaly . Normoactive bowel sounds.  Musculoskeletal:  . No cyanosis, clubbing, or edema Skin:  . Multiple areas of ecchymoses on the patient's right chest and right upper extremity. There are small rounded, fully moveable, tender nodule palpable beneath bruises. These are hematomas. Neurologic:  . CN 2-12 intact . Sensation all 4 extremities intact Psychiatric:  . Mental status o Mood, affect appropriate o Orientation to person, place, time  . judgment and insight appear intact  I have personally reviewed the following:   Today's Data  . Vitals, BMP, CBC  Micro Data  . Respiratory culture (06/09/2019) positive for pseudomonas aeruginosa  Scheduled Meds: . amLODipine  10 mg Oral Daily  . atorvastatin  20 mg Oral QHS  . bethanechol  10 mg Oral TID  . budesonide (PULMICORT) nebulizer solution  0.5 mg Nebulization BID  . Chlorhexidine Gluconate Cloth  6 each Topical Q0600  . cyclobenzaprine  5 mg Oral TID  . docusate sodium  100 mg Oral BID  . escitalopram  10 mg Oral Daily  . feeding supplement (ENSURE ENLIVE)  237 mL Oral TID BM  . insulin aspart  0-20 Units Subcutaneous Q4H  .  ipratropium-albuterol  3 mL Nebulization BID  . levothyroxine  200 mcg Oral Q0600  . loratadine  10 mg Oral Daily  . pantoprazole  40 mg Oral Daily  . polyethylene glycol  17 g Oral Daily  . predniSONE  30 mg Oral Q breakfast  . rivaroxaban  20 mg Oral Q supper  . topiramate  25 mg Oral Daily   Continuous Infusions: . sodium chloride Stopped (06/11/19 1741)    Active Problems:   Hyperlipidemia   Acute on chronic respiratory failure with hypoxia and hypercapnia (HCC)   Obesity, Class III, BMI 40-49.9 (morbid obesity) (HCC)   Thrombocytopenia (HCC)   LOS: 8 days   A & P  Acute on chronic  hypoxic/hypercapnic respiratory failure from COPD exacerbation, sleep disordered breathing, and on going tobacco abuse. Left sided pleural effusion. Pseudomonal tracheobronchitis: The patient continues to require BIPAP at night and Ashley during the day. Being weaned. He is on day 6 of cefepime. He will be continued on pulmicort and duoneb. She is also receiving a short course of prednisone. Goal SpO2 88 to 95%.    Acute metabolic encephalopathy from hypercapnia: Resolving. Off of precedex. Monitor.                                                                                                              Steroid induced hyperglycemia: Glucoses will be followed with FSBS and SSI.  Hx of HTN, HLD, a fib present prior to admission: The patient is normotensive. She has been continued on lipitor and xarelto.   Hx of hypothyroidism: Continue synthroid increased dose to 240mcg from 19mcg at home. Will need recheck level in 4 weeks.  I have seen and examined this patient myself. I have spent 30 minutes in her evaluation and care.  DVT prophylaxis: Xarelto Code Status: Full Family Communication: None at bedside  Status is: Inpatient  Remains inpatient appropriate because:Inpatient level of care appropriate due to severity of illness   Dispo: The patient is from: Home              Anticipated d/c is to: Home with home health PT/OT              Anticipated d/c date is: 2 days              Patient currently is not medically stable to d/c.    Guage Efferson, DO [facility] Direct contact: see [email or web address]  7PM-7AM contact night coverage as above 06/17/2019, 8:12 PM  LOS: 8 days

## 2019-06-17 NOTE — Progress Notes (Addendum)
Pt was assessed and found bruises on all extremities with lumps in each bruised location. Dr Benny Lennert Brytani Voth DO was informed via Amion and she recommends continuous observation of the bruised sites. We will evaluate each bruised location daily to determine it it is healing along with reduction of the lumps or nodules.  ADDENDUM: As stated in my progress note, and as LPN Opoku was informed I observed these bumps during my examination of the patient at noon today. These are hematomas as LPN Opoku and the patient were informed. They will be monitored.

## 2019-06-17 NOTE — Progress Notes (Signed)
  Speech Language Pathology Treatment: Dysphagia  Patient Details Name: Terri Casey MRN: [id number] DOB: 03-02-1960 Today's Date: 06/17/2019 Time: 8850-2774 SLP Time Calculation (min) (ACUTE ONLY): 23 min  Assessment / Plan / Recommendation Clinical Impression  Pt with initial dx of acute reversible dysphagia s/p four day intubation, now resolved.  She is independent with eating/swallowing. Demonstrates normal mastication, brisk swallow response, and no s/s of aspiration.  Synchrony of respiratory/swallow cycles is [facility] without concern for issues of timing.  Continue regular solids, thin liquids. No further SLP f/u is needed.  Of note - pt expressing desire to talk; was relating hx of childhood trauma and current worries.  Offered to call Chaplain's service - she was open to speaking with someone.  Will make referral.   HPI HPI: Pt is a 59 y.o. female with PMH COPD on 2.5 liters, HTN, HLD, Hypothyroidism, CAD, OSA, A fib who was brought to ED with dyspnea and altered mental status from acute on chronic hypoxic/hypercapnic respiratory failure from COPD exacerbation. CXR: Bibasilar atelectasis. No edema or airspace opacity. ETT 6/8-6/12.      SLP Plan  All goals met       Recommendations  Diet recommendations: Regular;Thin liquid Liquids provided via: Cup;Straw Medication Administration: Whole meds with liquid Supervision: Patient able to self feed                Oral Care Recommendations: Oral care BID Follow up Recommendations: None SLP Visit Diagnosis: Dysphagia, unspecified (R13.10) Plan: All goals met       GO               Terri Casey L. Tivis Ringer, [place] Office number [phone] Pager [phone]  Terri Casey 06/17/2019, 11:38 AM

## 2019-06-17 NOTE — Progress Notes (Signed)
Physical Therapy Treatment Patient Details Name: Terri Casey MRN: [id number] DOB: June 17, 1960 Today's Date: 06/17/2019    History of Present Illness Terri Casey is a 59 y.o. female with medical history of COPD, chronic respiratory failure on 2.5 L, hypertension, hyperlipidemia, hypothyroidism, morbid obesity, nonobstructive CAD, OSA presenting with 2 to 3 days of shortness of breath.  Pt was intubated in the ED, found to have pseudomonas PNA, extubated 6/12.    PT Comments    Pt making good progress today.  Did need min cues for RW use and pursed lip breathing.  She ambulated on 4 L O2 with sats down to 85% but recovered quickly (30 Secs).  Pt has 24 hr support at home and a RW.  Cont POC.      Follow Up Recommendations  Home health PT;Supervision/Assistance - 24 hour     Equipment Recommendations  3in1 (PT)    Recommendations for Other Services       Precautions / Restrictions Precautions Precautions: Fall Restrictions Weight Bearing Restrictions: No    Mobility  Bed Mobility Overal bed mobility: Needs Assistance Bed Mobility: Supine to Sit;Sit to Supine     Supine to sit: Min guard Sit to supine: Min guard   General bed mobility comments: in chair at arrival  Transfers Overall transfer level: Needs assistance Equipment used: Rolling walker (2 wheeled) Transfers: Sit to/from Stand Sit to Stand: Min guard;Min assist         General transfer comment: Min A and use of momentum to power up from lower chair; min guard from elevated bed; did not need cues for safe hand placement  Ambulation/Gait Ambulation/Gait assistance: Min guard Gait Distance (Feet): 100 Feet (100' and then 20') Assistive device: Rolling walker (2 wheeled) Gait Pattern/deviations: Step-through pattern     General Gait Details: Generally steady but provided min guard for safety.  Min cues for pursed lip breathing.   Stairs             Wheelchair Mobility    Modified  Rankin (Stroke Patients Only)       Balance Overall balance assessment: Needs assistance Sitting-balance support: No upper extremity supported;Feet supported Sitting balance-Leahy Scale: Good     Standing balance support: No upper extremity supported Standing balance-Leahy Scale: Fair Standing balance comment: Pt able to lift hands from walker but did not do so with challenges                            Cognition Arousal/Alertness: Awake/alert Behavior During Therapy: WFL for tasks assessed/performed Overall Cognitive Status: Within Functional Limits for tasks assessed                                 General Comments: pt reports feeling slightly confused at times;pt requires increased time for processing information;pt oriented x4      Exercises      General Comments General comments (skin integrity, edema, etc.): Pt on 4 LPM O2 rest with sats 94%.  Down to 85% ambulation, but recovered in 30 sec of rest and cues for pursed lip breathing      Pertinent Vitals/Pain Pain Assessment: No/denies pain Faces Pain Scale: No hurt Pain Intervention(s): Monitored during session    Home Living Family/patient expects to be discharged to:: Private residence Living Arrangements: Alone;Children;Other relatives (4 adults and a baby) Available Help at Discharge: Family;Available 24 hours/day;Available PRN/intermittently (  girlfriend help care for patient when son works) Type of Home: House Home Access: Stairs to enter Entrance Stairs-Rails: Left [place]: One level Home Equipment: Environmental consultant - 2 wheels;Other (comment) (TBA)      Prior Function Level of Independence: Needs assistance  Gait / Transfers Assistance Needed: "cruises" furniture in the home, w/c out to appt. ADL's / Homemaking Assistance Needed: assisted by son's girlfriend in some ADL's     PT Goals (current goals can now be found in the care plan section) Acute Rehab PT Goals Patient Stated Goal:  get back home PT Goal Formulation: With patient Time For Goal Achievement: 07/01/19 Potential to Achieve Goals: Good Progress towards PT goals: Progressing toward goals    Frequency    Min 3X/week      PT Plan Current plan remains appropriate    Co-evaluation              AM-PAC PT "6 Clicks" Mobility   Outcome Measure  Help needed turning from your back to your side while in a flat bed without using bedrails?: A Little Help needed moving from lying on your back to sitting on the side of a flat bed without using bedrails?: A Little Help needed moving to and from a bed to a chair (including a wheelchair)?: A Little Help needed standing up from a chair using your arms (e.g., wheelchair or bedside chair)?: A Little Help needed to walk in hospital room?: None Help needed climbing 3-5 steps with a railing? : A Little 6 Click Score: 19    End of Session Equipment Utilized During Treatment: Gait belt;Oxygen Activity Tolerance: Patient tolerated treatment well Patient left: with call bell/phone within reach;in chair Nurse Communication: Mobility status PT Visit Diagnosis: Unsteadiness on feet (R26.81);Other abnormalities of gait and mobility (R26.89);Muscle weakness (generalized) (M62.81)     Time: 3491-7915 PT Time Calculation (min) (ACUTE ONLY): 26 min  Charges:  $Gait Training: 8-22 mins $Therapeutic Activity: 8-22 mins                     Abran Richard, PT Acute Rehab Services Pager [phone] [facility] [place] 06/17/2019, 3:07 PM

## 2019-06-18 LAB — GLUCOSE, CAPILLARY
Glucose-Capillary: 102 mg/dL — ABNORMAL HIGH (ref 70–99)
Glucose-Capillary: 83 mg/dL (ref 70–99)
Glucose-Capillary: 84 mg/dL (ref 70–99)
Glucose-Capillary: 134 mg/dL — ABNORMAL HIGH (ref 70–99)
Glucose-Capillary: 149 mg/dL — ABNORMAL HIGH (ref 70–99)

## 2019-06-18 MED ORDER — ACETAMINOPHEN 325 MG PO TABS
650.0000 mg | ORAL_TABLET | Freq: Four times a day (QID) | ORAL | Status: DC | PRN
  Administered 2019-06-18 (×2): 650 mg via ORAL
  Filled 2019-06-18 (×2): qty 2

## 2019-06-18 MED ORDER — MELATONIN 5 MG PO TABS
5.0000 mg | ORAL_TABLET | Freq: Every evening | ORAL | Status: DC | PRN
  Administered 2019-06-18: 5 mg via ORAL
  Filled 2019-06-18: qty 1

## 2019-06-18 MED FILL — Medication: Qty: 1 | Status: AC

## 2019-06-18 NOTE — Progress Notes (Signed)
PROGRESS NOTE  KANON COLUNGA WRU:[id number] DOB: June 02, 1960 DOA: 06/09/2019 PCP: Patient, No Pcp Per  Brief History   59 yo F with COPD and chronic hypoxic respiratory failure on 2L at home, also VTE on Xarelto HTN, and hypothyroidism ,smoker brought to ER with dyspnea and altered mental status from acute on chronic hypoxic/hypercapnic respiratory failure from COPD exacerbation. She required intubation in ER. Found to have Pseudomonas pneumonia.  Intubated 6/8. Extubated 6/12, now interactive, remains on 8L HFNC. Sleeps with BiPAP chronically. Post-extubation complicated by delirium, now off Precedex and interactive. Should have 7 days tx for pseudomonas. Transferred out for  [facility] to pick up  On 6/15  The patient is complaining of painful lumps on her right arm and her right chest. They are hematomas.  Consultants  . PCCM  Procedures  . Mechanical ventilation  Antibiotics   Anti-infectives (From admission, onward)   Start     Dose/Rate Route Frequency Ordered Stop   06/11/19 1030  ceFEPIme (MAXIPIME) 2 g in sodium chloride 0.9 % 100 mL IVPB  Status:  Discontinued        2 g 200 mL/hr over 30 Minutes Intravenous Every 8 hours 06/11/19 1015 06/15/19 0830   06/09/19 1800  doxycycline (VIBRA-TABS) tablet 100 mg        100 mg Per Tube  Once 06/09/19 1725 06/09/19 1808   06/09/19 1415  doxycycline (VIBRA-TABS) tablet 100 mg  Status:  Discontinued        100 mg Per Tube Every 12 hours 06/09/19 1337 06/11/19 1015     Subjective  The patient is resting comfortably. No new complaints.  Objective   Vitals:  Vitals:   06/18/19 1203 06/18/19 1649  BP: (!) 106/49 133/74  Pulse: 86 96  Resp: 20 18  Temp: 98.6 F (37 C) 98 F (36.7 C)  SpO2: 98% 93%   Exam:  Constitutional:  . The patient is awake, alert, and oriented x 3. No acute distress. Respiratory:  . No increased work of breathing. . No wheezes, rales, or rhonchi . No tactile fremitus Cardiovascular:  . Regular  rate and rhythm . No murmurs, ectopy, or gallups. . No lateral PMI. No thrills. Abdomen:  . Abdomen is soft, non-tender, non-distended . No hernias, masses, or organomegaly . Normoactive bowel sounds.  Musculoskeletal:  . No cyanosis, clubbing, or edema Skin:  . Multiple areas of ecchymoses on the patient's right chest and right upper extremity. There are small rounded, fully moveable, tender nodule palpable beneath bruises. These are hematomas. They seem less tender today. Neurologic:  . CN 2-12 intact . Sensation all 4 extremities intact Psychiatric:  . Mental status o Mood, affect appropriate o Orientation to person, place, time  . judgment and insight appear intact  I have personally reviewed the following:   Today's Data  . Vitals, BMP, CBC  Micro Data  . Respiratory culture (06/09/2019) positive for pseudomonas aeruginosa  Scheduled Meds: . amLODipine  10 mg Oral Daily  . atorvastatin  20 mg Oral QHS  . bethanechol  10 mg Oral TID  . budesonide (PULMICORT) nebulizer solution  0.5 mg Nebulization BID  . Chlorhexidine Gluconate Cloth  6 each Topical Q0600  . cyclobenzaprine  5 mg Oral TID  . docusate sodium  100 mg Oral BID  . escitalopram  10 mg Oral Daily  . feeding supplement (ENSURE ENLIVE)  237 mL Oral TID BM  . insulin aspart  0-20 Units Subcutaneous Q4H  . ipratropium-albuterol  3 mL  Nebulization BID  . levothyroxine  200 mcg Oral Q0600  . loratadine  10 mg Oral Daily  . pantoprazole  40 mg Oral Daily  . polyethylene glycol  17 g Oral Daily  . predniSONE  30 mg Oral Q breakfast  . rivaroxaban  20 mg Oral Q supper  . topiramate  25 mg Oral Daily   Continuous Infusions: . sodium chloride Stopped (06/11/19 1741)    Active Problems:   Hyperlipidemia   Acute on chronic respiratory failure with hypoxia and hypercapnia (HCC)   Obesity, Class III, BMI 40-49.9 (morbid obesity) (HCC)   Thrombocytopenia (HCC)   LOS: 9 days   A & P  Acute on chronic  hypoxic/hypercapnic respiratory failure: Due to  COPD exacerbation, sleep disordered breathing, and on going tobacco abuse. Left sided pleural effusion. Pseudomonal tracheobronchitis: The patient continues to require BIPAP at night and  during the day. Being weaned. She is on day 6 of cefepime. He will be continued on pulmicort and duoneb. She is also receiving a short course of prednisone. Goal SpO2 88 to 95%.  Acute metabolic encephalopathy: Off of precedex. Monitor.   Steroid induced hyperglycemia: Glucoses will be followed with FSBS and SSI.  Hx of HTN, HLD, a fib present prior to admission: The patient is normotensive. She has been continued on lipitor and xarelto.   Hx of hypothyroidism: Continue synthroid increased dose to 278mcg from 166mcg at home. Will need recheck level in 6 weeks.  I have seen and examined this patient myself. I have spent 32 minutes in her evaluation and care.  DVT prophylaxis: Xarelto Code Status: Full Family Communication: None at bedside  Status is: Inpatient  Remains inpatient appropriate because:Inpatient level of care appropriate due to severity of illness   Dispo: The patient is from: Home              Anticipated d/c is to: Home with home health PT/OT. She will need 24 hours supervision.              Anticipated d/c date is: 2 days              Patient currently is not medically stable to d/c. Barrier to discharge includes the patient continues to require BIPAP at night. This is her last day of cefepime.   Yomaira Solar, DO [facility] Direct contact: see [email or web address]  7PM-7AM contact night coverage as above 06/18/2019, 5:13 PM  LOS: 8 days

## 2019-06-18 NOTE — Progress Notes (Signed)
Physical Therapy Treatment Patient Details Name: JAMEL DUNTON MRN: [id number] DOB: 1960-11-24 Today's Date: 06/18/2019    History of Present Illness YUETTE PUTNAM is a 59 y.o. female with medical history of COPD, chronic respiratory failure on 2.5 L, hypertension, hyperlipidemia, hypothyroidism, morbid obesity, nonobstructive CAD, OSA presenting with 2 to 3 days of shortness of breath.  Pt was intubated in the ED, found to have pseudomonas PNA, extubated 6/12.    PT Comments    Patient seen for mobiltiy progression. Pt requires min guard assist for gait training with RW. Pt tolerated distance of 100 ft with standing rest break and pursed lip breathing technique. Pt on 4L o2 via [place]. Current plan remains appropriate.   Follow Up Recommendations  Home health PT;Supervision/Assistance - 24 hour     Equipment Recommendations  3in1 (PT)    Recommendations for Other Services       Precautions / Restrictions Precautions Precautions: Fall Restrictions Weight Bearing Restrictions: No    Mobility  Bed Mobility Overal bed mobility: Modified Independent Bed Mobility: Supine to Sit           General bed mobility comments: HOB elevated slightly and use of rail   Transfers Overall transfer level: Needs assistance Equipment used: Rolling walker (2 wheeled);1 person hand held assist Transfers: Sit to/from [facility] Sit to Stand: Min guard Stand pivot transfers: Min assist       General transfer comment: pt able to stand with min guard assist from EOB and BSC; SPT with [facility] to [facility]   Ambulation/Gait Ambulation/Gait assistance: Min guard Gait Distance (Feet): 100 Feet Assistive device: Rolling walker (2 wheeled) Gait Pattern/deviations: Step-through pattern;Decreased stride length;Trunk flexed Gait velocity: decreased   General Gait Details: decreased cadence and stride length; cues for pursed lip breathing technique and one standing rest break required     Stairs             Wheelchair Mobility    Modified Rankin (Stroke Patients Only)       Balance Overall balance assessment: Needs assistance Sitting-balance support: No upper extremity supported;Feet supported Sitting balance-Leahy Scale: Good     Standing balance support: Bilateral upper extremity supported;Single extremity supported;During functional activity Standing balance-Leahy Scale: Poor                              Cognition Arousal/Alertness: Awake/alert Behavior During Therapy: WFL for tasks assessed/performed Overall Cognitive Status: Within Functional Limits for tasks assessed                                 General Comments: pt reports STM deficits       Exercises      General Comments        Pertinent Vitals/Pain Pain Assessment: Faces Faces Pain Scale: Hurts a little bit Pain Location: "all over" Pain Descriptors / Indicators: Sore Pain Intervention(s): Monitored during session    Home Living                      Prior Function            PT Goals (current goals can now be found in the care plan section) Progress towards PT goals: Progressing toward goals    Frequency    Min 3X/week      PT Plan Current plan remains appropriate    Co-evaluation  AM-PAC PT "6 Clicks" Mobility   Outcome Measure  Help needed turning from your back to your side while in a flat bed without using bedrails?: A Little Help needed moving from lying on your back to sitting on the side of a flat bed without using bedrails?: A Little Help needed moving to and from a bed to a chair (including a wheelchair)?: A Little Help needed standing up from a chair using your arms (e.g., wheelchair or bedside chair)?: A Little Help needed to walk in hospital room?: A Little Help needed climbing 3-5 steps with a railing? : A Little 6 Click Score: 18    End of Session Equipment Utilized During Treatment:  Gait belt;Oxygen (4L) Activity Tolerance: Patient tolerated treatment well Patient left: with call bell/phone within reach;in chair;with chair alarm set Nurse Communication: Mobility status PT Visit Diagnosis: Unsteadiness on feet (R26.81);Other abnormalities of gait and mobility (R26.89);Muscle weakness (generalized) (M62.81)     Time: 1219-7588 PT Time Calculation (min) (ACUTE ONLY): 27 min  Charges:  $Gait Training: 23-37 mins                     Earney Navy, PTA Acute Rehabilitation Services Pager: [phone] Office: [phone]     Darliss Cheney 06/18/2019, 5:38 PM

## 2019-06-19 LAB — GLUCOSE, CAPILLARY
Glucose-Capillary: 115 mg/dL — ABNORMAL HIGH (ref 70–99)
Glucose-Capillary: 94 mg/dL (ref 70–99)
Glucose-Capillary: 169 mg/dL — ABNORMAL HIGH (ref 70–99)

## 2019-06-19 MED ORDER — BUDESONIDE 0.5 MG/2ML IN SUSP: 0.5000 mg | Freq: Two times a day (BID) | RESPIRATORY_TRACT | 0 refills | Status: DC

## 2019-06-19 MED ORDER — SALINE SPRAY 0.65 % NA SOLN: 1.0000 | NASAL | 0 refills | Status: DC | PRN

## 2019-06-19 MED ORDER — BETHANECHOL CHLORIDE 10 MG PO TABS: 10.0000 mg | ORAL_TABLET | Freq: Three times a day (TID) | ORAL | 0 refills | Status: DC

## 2019-06-19 MED ORDER — POLYETHYLENE GLYCOL 3350 17 G PO PACK: 17.0000 g | PACK | Freq: Every day | ORAL | 0 refills | Status: DC

## 2019-06-19 MED ORDER — LEVOTHYROXINE SODIUM 200 MCG PO TABS: 200.0000 ug | ORAL_TABLET | Freq: Every day | ORAL | 0 refills | Status: AC

## 2019-06-19 MED ORDER — PANTOPRAZOLE SODIUM 40 MG PO TBEC: 40.0000 mg | DELAYED_RELEASE_TABLET | Freq: Every day | ORAL | 0 refills | Status: AC

## 2019-06-19 MED ORDER — ENSURE ENLIVE PO LIQD: 237.0000 mL | Freq: Three times a day (TID) | ORAL | 12 refills | Status: DC

## 2019-06-19 MED ORDER — AMLODIPINE BESYLATE 10 MG PO TABS: 10.0000 mg | ORAL_TABLET | Freq: Every day | ORAL | 0 refills | Status: DC

## 2019-06-19 NOTE — Discharge Summary (Signed)
Physician Discharge Summary  Terri Casey YQM:[id number] DOB: 01/16/1960 DOA: 06/09/2019  PCP: Patient, No Pcp Per  Admit date: 06/09/2019 Discharge date: 06/19/2019  Recommendations for Outpatient Follow-up:  1. No driving until cleared by PCP 2. Follow up with PCP in 7-10 days  3. Discharge is to home with home health PT/OT, walker, and 3-1 4. Home O2 is increased to 4L by [place].  Discharge Diagnoses: Principal diagnosis is #1 1. Acute on chronic hypoxic respiratory failure 2. COPD exac 3. Tobacco abuse 4. Sleep disordered breathing 5. Left sided pleural effusion 6. Pseudomonas tracheobronchitis 7. Hypertension 8. Hyperlipidemia 9. Atrial Fibrillation 10. Hypothyroidism  Discharge Condition: Fair Disposition: Home  Diet recommendation: Heart healthy  Filed Weights   06/11/19 0500 06/12/19 0427 06/13/19 0417  Weight: (!) 139 kg (!) 141 kg (!) 138.6 kg    History of present illness:  Terri Casey is a 59 y.o. female with medical history of 59 year old female with a history of COPD, chronic respiratory failure on 2.5 L, hypertension, hyperlipidemia, hypothyroidism, morbid obesity, nonobstructive CAD, OSA presenting with 2 to 3 days of shortness of breath that has significantly worsened on 06/08/2019.  The patient was supposed to go to see her primary care provider on 06/08/2019, but did not make it because she was not feeling well.  Patient lives with her son, and family noticed the patient was more somnolent and more short of breath in a.m. on 06/09/2019.  EMS was activated.  Upon EMS arrival, the patient was noted to have oxygen saturation 77% on 3 L.  The patient was placed on CPAP with improvement of her respiratory status.  Her blood pressure remained stable at 146/86 per EMS.  However upon arrival to the emergency department, the patient was more somnolent and minimally responsive.  As result, the patient was intubated for airway protection.  According to the patient's family,  she continues to smoke 3 to 4 cigarettes/day.  She does not drink any alcohol or use illicit drugs.  There is been no recent complaints of fevers, chills, nausea, vomiting or diarrhea, hematochezia, dysuria, hematuria.  The patient has chronic pain syndrome and complains of pain all over on a daily basis. In the emergency department, the patient was somnolent.  She was intubated for airway protection.  She was initially hypothermic with a blood pressure of 95.7.  After intubation, the patient became mildly hypotensive with systolic blood pressure in the 80s.  BMP and CBC were largely unremarkable.  ABG was 7.172/> 120/71 on 50%.  Chest x-ray showed cardiomegaly and increased interstitial markings.  The patient was started on Solu-Medrol.  Hospital Course:  59 yo F with COPD and chronic hypoxic respiratory failure on 2L at home, also VTE on Xarelto HTN, and hypothyroidism,smoker brought to ER with dyspnea and altered mental status from acute on chronic hypoxic/hypercapnic respiratory failure from COPD exacerbation. She required intubation in ER.Found to have Pseudomonas pneumonia.  Intubated 6/8. Extubated 6/12, now interactive, remains on 8L HFNC. Sleeps with BiPAP chronically. Post-extubation complicated by delirium, now off Precedex and interactive. Should have 7 days tx for pseudomonas. Transferred out [place] to pick up On 6/15  The patient is complaining of painful lumps on her right arm and her right chest. They are hematomas.  Today's assessment: S: The patient is resting comfortably. O: Vitals:  Vitals:   06/19/19 0733 06/19/19 0846  BP:  124/63  Pulse:  79  Resp:    Temp:  98 F (36.7 C)  SpO2: 99%  99%    Exam:  Constitutional:  . The patient is awake, alert, and oriented x 3. No acute distress. Respiratory:  . No increased work of breathing. . No wheezes, rales, or rhonchi . No tactile fremitus Cardiovascular:  . Regular rate and rhythm . No murmurs, ectopy, or  gallups. . No lateral PMI. No thrills. Abdomen:  . Abdomen is soft, non-tender, non-distended . No hernias, masses, or organomegaly . Normoactive bowel sounds.  Musculoskeletal:  . No cyanosis, clubbing, or edema Skin:   Multiple areas of ecchymoses on the patient's right chest and right upper extremity. There are small rounded, fully moveable, tender nodule palpable beneath bruises. These are hematomas. They seem less tender and a little smaller again today. Neurologic:  . CN 2-12 intact . Sensation all 4 extremities intact Psychiatric:  . Mental status o Mood, affect appropriate o Orientation to person, place, time  . judgment and insight appear intact  Discharge Instructions  Discharge Instructions    Call MD for:  difficulty breathing, headache or visual disturbances   Complete by: As directed    Call MD for:  extreme fatigue   Complete by: As directed    Call MD for:  persistant dizziness or light-headedness   Complete by: As directed    Call MD for:  temperature >100.4   Complete by: As directed    Diet - low sodium heart healthy   Complete by: As directed    Discharge instructions   Complete by: As directed    No driving until cleared by PCP Follow up with PCP in 7-10 days  Discharge is to home with home health PT/OT, walker, and 3-1 Home O2 is increased to 4L by [place].   Increase activity slowly   Complete by: As directed      Allergies as of 06/19/2019      Reactions   Estrogens Hives   Mustard Seed Hives   Strawberry Extract Hives      Medication List    STOP taking these medications   ALPRAZolam 1 MG tablet Commonly known as: XANAX   citalopram 20 MG tablet Commonly known as: CELEXA   famotidine 20 MG tablet Commonly known as: PEPCID   fluticasone 50 MCG/ACT nasal spray Commonly known as: FLONASE   meclizine 25 MG tablet Commonly known as: ANTIVERT   oxyCODONE-acetaminophen 10-325 MG tablet Commonly known as: PERCOCET   OXYGEN     tiotropium 18 MCG inhalation capsule Commonly known as: SPIRIVA   tiZANidine 4 MG tablet Commonly known as: ZANAFLEX     TAKE these medications   Advair Diskus 500-50 MCG/DOSE Aepb Generic drug: Fluticasone-Salmeterol Inhale 1 puff into the lungs every 12 (twelve) hours.   amLODipine 10 MG tablet Commonly known as: NORVASC Take 1 tablet (10 mg total) by mouth daily. Start taking on: June 20, 2019   atorvastatin 20 MG tablet Commonly known as: LIPITOR Take 20 mg by mouth at bedtime.   bethanechol 10 MG tablet Commonly known as: URECHOLINE Take 1 tablet (10 mg total) by mouth 3 (three) times daily.   budesonide 0.5 MG/2ML nebulizer solution Commonly known as: PULMICORT Take 2 mLs (0.5 mg total) by nebulization 2 (two) times daily.   Combivent 18-103 MCG/ACT inhaler Generic drug: albuterol-ipratropium Inhale into the lungs every 4 (four) hours.   ipratropium-albuterol 0.5-2.5 (3) MG/3ML Soln Commonly known as: DUONEB Take 3 mLs by nebulization every 2 (two) hours as needed. Use 3 times daily x 4 days, then every 2 hours as  needed.   cyclobenzaprine 5 MG tablet Commonly known as: FLEXERIL Take 5 mg by mouth 3 (three) times daily.   escitalopram 10 MG tablet Commonly known as: LEXAPRO Take 10 mg by mouth daily.   feeding supplement (ENSURE ENLIVE) Liqd Take 237 mLs by mouth 3 (three) times daily between meals.   folic acid 1 MG tablet Commonly known as: FOLVITE Take 1 mg by mouth daily.   GNP Vitamin C 1000 MG tablet Generic drug: ascorbic acid Take 1,000 mg by mouth daily.   levothyroxine 200 MCG tablet Commonly known as: SYNTHROID Take 1 tablet (200 mcg total) by mouth daily at 6 (six) AM. Start taking on: June 20, 2019 What changed:   medication strength  See the new instructions.   loratadine 10 MG tablet Commonly known as: CLARITIN Take 10 mg by mouth daily.   pantoprazole 40 MG tablet Commonly known as: PROTONIX Take 1 tablet (40 mg total) by  mouth daily. Start taking on: June 20, 2019   polyethylene glycol 17 g packet Commonly known as: MIRALAX / GLYCOLAX Take 17 g by mouth daily. Start taking on: June 20, 2019   albuterol (2.5 MG/3ML) 0.083% nebulizer solution Commonly known as: PROVENTIL Inhale 3 mLs into the lungs in the morning, at noon, in the evening, and at bedtime.   ProAir HFA 108 (90 Base) MCG/ACT inhaler Generic drug: albuterol Inhale 2 puffs into the lungs every 6 (six) hours as needed for wheezing.   senna-docusate 8.6-50 MG tablet Commonly known as: Senokot-S Take 2 tablets by mouth 2 (two) times daily.   sodium chloride 0.65 % Soln nasal spray Commonly known as: OCEAN Place 1 spray into both nostrils as needed for congestion.   Stiolto Respimat 2.5-2.5 MCG/ACT Aers Generic drug: Tiotropium Bromide-Olodaterol Inhale 2 puffs into the lungs daily.   topiramate 100 MG tablet Commonly known as: TOPAMAX Take 25 mg by mouth daily.   Vitamin D (Ergocalciferol) 1.25 MG (50000 UNIT) Caps capsule Commonly known as: DRISDOL TAKE ONE CAPSULE BY MOUTH ONCE A WEEK   Vitamin D3 125 MCG (5000 UT) Caps Take 1 capsule (5,000 Units total) by mouth daily.   Xarelto 20 MG Tabs tablet Generic drug: rivaroxaban Take 20 mg by mouth daily.   Zinc 50 MG Tabs Take 1 tablet by mouth daily.            Durable Medical Equipment  (From admission, onward)         Start     Ordered   06/19/19 1210  DME Oxygen  Once       Question Answer Comment  Length of Need Lifetime   Mode or (Route) Nasal cannula   Liters per Minute 4   Oxygen delivery system Gas      06/19/19 1212   06/19/19 1210  DME 3-in-1  Once        06/19/19 1212   06/19/19 1210  DME Walker  Once       Question Answer Comment  Walker: With 5 Inch Wheels   Patient needs a walker to treat with the following condition Ambulatory dysfunction      06/19/19 1212         Allergies  Allergen Reactions  . Estrogens Hives  . Mustard [employer]   . Strawberry Extract Hives    The results of significant diagnostics from this hospitalization (including imaging, microbiology, ancillary and laboratory) are listed below for reference.    Significant Diagnostic Studies: DG Chest [facility] 1 [address]  Result Date: 06/13/2019 CLINICAL DATA:  Hypoxia EXAM: PORTABLE CHEST 1 VIEW COMPARISON:  June 11, 2010 FINDINGS: Endotracheal tube tip is 5.6 cm above the carina. Nasogastric tube tip and side port are in the stomach. No pneumothorax. There is bibasilar atelectasis. Lungs elsewhere clear. Heart is mildly enlarged with pulmonary vascularity normal. No adenopathy. No bone lesions. IMPRESSION: Tube positions as described without pneumothorax. Bibasilar atelectasis. No edema or airspace opacity. Stable cardiac prominence. Electronically Signed   By: Lowella Grip III M.D.   On: 06/13/2019 08:14   DG Chest Port 1 View  Result Date: 06/11/2019 CLINICAL DATA:  Respiratory distress, intubation EXAM: PORTABLE CHEST 1 VIEW COMPARISON:  Radiograph 06/10/2019 FINDINGS: Endotracheal tube in the mid trachea, 3.4 cm from the carina. Transesophageal tube tip and side port terminate in the left upper quadrant, beyond the GE junction. Telemetry leads overlie the chest. Persistent streaky and reticular opacities in the lung bases which may reflect some residual atelectatic change upon more chronic interstitial and bronchitic features. There is blunting of the left cardiophrenic sulcus, may reflect some small pleural effusion. No right effusion. No pneumothorax. Cardiomediastinal contours are stable from prior. No acute osseous or soft tissue abnormality. IMPRESSION: 1. Persistent streaky and reticular opacities in the lung bases which may reflect some residual atelectatic change upon more chronic interstitial and bronchitic features. 2. Suspect small left pleural effusion. Electronically Signed   By: Lovena Le M.D.   On: 06/11/2019 06:55   DG Chest Port 1 View  Result  Date: 06/10/2019 CLINICAL DATA:  Respiratory failure, intubation EXAM: PORTABLE CHEST 1 VIEW COMPARISON:  Radiograph 06/09/2019, CT 02/03/2019 FINDINGS: Endotracheal tube in the mid trachea, 5 cm from the carina. Transesophageal tube tip below the level of imaging, beyond the GE junction. Telemetry leads overlie the chest. Some improving lung volumes with diminished opacities in the lung bases likely improving atelectasis. Persistent mild central vascular congestion without features of frank edema. No visible effusion the portions of the costophrenic sulci are collimated from view. No pneumothoraces. No acute osseous or soft tissue abnormality. IMPRESSION: 1. Improving lung volumes with diminished opacities in the lung bases, likely improving atelectasis. 2. Persistent mild central vascular congestion. 3. Support devices as above. Electronically Signed   By: Lovena Le M.D.   On: 06/10/2019 06:15   DG Chest Portable 1 View  Result Date: 06/09/2019 CLINICAL DATA:  Intubation EXAM: PORTABLE CHEST 1 VIEW COMPARISON:  02/07/2019 FINDINGS: Endotracheal tube is 4 cm above the carina. NG tube enters the stomach. Cardiomegaly. Mild vascular congestion. No confluent opacities, effusions or edema. IMPRESSION: Endotracheal tube 4 cm above the carina. Cardiomegaly, vascular congestion Electronically Signed   By: Rolm Baptise M.D.   On: 06/09/2019 10:41   DG Chest Port 1V same Day  Result Date: 06/11/2019 CLINICAL DATA:  Endotracheal tube and orogastric tube placement. EXAM: PORTABLE CHEST 1 VIEW COMPARISON:  June 11, 2019 (3:32 a.m.) FINDINGS: Endotracheal tube is seen with its distal tip approximately 5.3 cm from the carina. A nasogastric tube is noted with its distal end seen within the body of the stomach. Mild atelectasis and/or infiltrate is seen within the right lung base, with moderate severity atelectasis and/or infiltrate noted within the retrocardiac region of the left lung base. There is a small left pleural  effusion. No pneumothorax is identified. The cardiac silhouette is mildly enlarged. The visualized skeletal structures are unremarkable. IMPRESSION: 1. Endotracheal tube and nasogastric tube in good position. 2. Mild atelectasis and/or infiltrate within the right lung  base, with moderate severity atelectasis and/or infiltrate noted within the retrocardiac region of the left lung base. 3. Small left pleural effusion. Electronically Signed   By: Virgina Norfolk M.D.   On: 06/11/2019 15:22   ECHOCARDIOGRAM COMPLETE  Result Date: 06/09/2019    ECHOCARDIOGRAM REPORT   Patient Name:   WILENE PHARO Schoff Date of Exam: 06/09/2019 Medical Rec #:  [id number]         Height:       69.0 in Accession #:    [id number]        Weight:       276.9 lb Date of Birth:  06-09-1960        BSA:          2.372 m Patient Age:    41 years          BP:           106/81 mmHg Patient Gender: F                 HR:           90 bpm. Exam Location:  [facility] Procedure: 2D Echo, Cardiac Doppler and Color Doppler Indications:    Dyspnea 786.09 / R06.00  History:        Patient has prior history of Echocardiogram examinations, most                 recent 03/26/2015. COPD; Risk Factors:Dyslipidemia, Current                 Smoker, Diabetes and Hypertension. Obesity, Class III, BMI                 40-49.9 (morbid obesity).  Sonographer:    Alvino Chapel RCS Referring Phys: [phone] DAVID TAT  Sonographer Comments: Technically difficult study due to poor echo windows. Patient on mechanical ventilator during echo. IMPRESSIONS  1. Left ventricular ejection fraction, by estimation, is 50 to 55%. The left ventricle has low normal function. The left ventricle demonstrates global hypokinesis. There is mild left ventricular hypertrophy. Left ventricular diastolic parameters are indeterminate.  2. Right ventricular systolic function is severely reduced. The right ventricular size is mildly enlarged. Mildly increased right ventricular wall thickness. Tricuspid  regurgitation signal is inadequate for assessing PA pressure.  3. Right atrial size was upper normal.  4. The mitral valve is grossly normal with mild annular calcification. Trivial mitral valve regurgitation.  5. The aortic valve was not well visualized. Aortic valve regurgitation is not visualized. FINDINGS  Left Ventricle: Left ventricular ejection fraction, by estimation, is 50 to 55%. The left ventricle has low normal function. The left ventricle demonstrates global hypokinesis. Definity contrast agent was given IV to delineate the left ventricular endocardial borders. The left ventricular internal cavity size was normal in size. There is mild left ventricular hypertrophy. Left ventricular diastolic parameters are indeterminate. Right Ventricle: The right ventricular size is mildly enlarged. Mildly increased right ventricular wall thickness. Right ventricular systolic function is severely reduced. Tricuspid regurgitation signal is inadequate for assessing PA pressure. Left Atrium: Left atrial size was normal in size. Right Atrium: Right atrial size was upper normal. Pericardium: A small pericardial effusion is present. The pericardial effusion is anterior to the right ventricle. Mitral Valve: The mitral valve is grossly normal. Mild mitral annular calcification. Trivial mitral valve regurgitation. Tricuspid Valve: The tricuspid valve is grossly normal. Tricuspid valve regurgitation is trivial. Aortic Valve: The aortic valve was not well visualized. Aortic valve regurgitation  is not visualized. Pulmonic Valve: The pulmonic valve was not well visualized. Pulmonic valve regurgitation is not visualized. Aorta: The aortic root is normal in size and structure. Venous: IVC assessment for right atrial pressure unable to be performed due to mechanical ventilation. IAS/Shunts: No atrial level shunt detected by color flow Doppler.  LEFT VENTRICLE PLAX 2D LVIDd:         4.56 cm  Diastology LVIDs:         3.56 cm  LV e'  lateral:   6.31 cm/s LV PW:         1.14 cm  LV E/e' lateral: 8.7 LV IVS:        1.15 cm  LV e' medial:    9.46 cm/s LVOT diam:     2.10 cm  LV E/e' medial:  5.8 LV SV:         41 LV SV Index:   17 LVOT Area:     3.46 cm  RIGHT VENTRICLE RV S prime:     6.42 cm/s TAPSE (M-mode): 1.2 cm LEFT ATRIUM             Index       RIGHT ATRIUM           Index LA Vol (A2C):   48.9 ml 20.61 ml/m RA Area:     18.40 cm LA Vol (A4C):   27.9 ml 11.76 ml/m RA Volume:   57.00 ml  24.03 ml/m LA Biplane Vol: 37.9 ml 15.98 ml/m  AORTIC VALVE LVOT Vmax:   70.00 cm/s LVOT Vmean:  51.200 cm/s LVOT VTI:    0.119 m MITRAL VALVE MV Area (PHT): 2.87 cm    SHUNTS MV Decel Time: 265 msec    Systemic VTI:  0.12 m MV E velocity: 54.70 cm/s  Systemic Diam: 2.10 cm MV A velocity: 35.30 cm/s MV E/A ratio:  1.55 Rozann Lesches MD Electronically signed by Rozann Lesches MD Signature Date/Time: 06/09/2019/4:40:55 PM    Final     Microbiology: No results found for this or any previous visit (from the past 240 hour(s)).   Labs: Basic Metabolic Panel: Recent Labs  Lab 06/13/19 0258 06/13/19 1026 06/14/19 1153 06/15/19 0311 06/17/19 0322  NA 138 139 140 141 138  K 4.4 3.9 4.9 5.6* 4.1  CL 97*  --  97* 99 98  CO2 32  --  31 28 34*  GLUCOSE 129*  --  103* 94 85  BUN 34*  --  24* 24* 16  CREATININE 0.70  --  0.66 0.81 0.59  CALCIUM 8.9  --  9.3 9.1 8.6*   Liver Function Tests: Recent Labs  Lab 06/13/19 0258 06/14/19 1153 06/15/19 0311  AST 17 22 24   ALT 14 16 16   ALKPHOS 67 77 78  BILITOT 0.5 0.9 0.8  PROT 5.7* 6.4* 6.5  ALBUMIN 2.7* 3.0* 3.1*   No results for input(s): LIPASE, AMYLASE in the last 168 hours. No results for input(s): AMMONIA in the last 168 hours. CBC: Recent Labs  Lab 06/13/19 0258 06/13/19 1026 06/17/19 0322  WBC 9.9  --  7.9  HGB 12.2 12.6 11.6*  HCT 38.9 37.0 38.4  MCV 106.3*  --  108.8*  PLT 115*  --  131*   Cardiac Enzymes: No results for input(s): CKTOTAL, CKMB, CKMBINDEX,  TROPONINI in the last 168 hours. BNP: BNP (last 3 results) Recent Labs    06/09/19 0935 06/11/19 2054  BNP 444.0* 56.8    ProBNP (last 3  results) No results for input(s): PROBNP in the last 8760 hours.  CBG: Recent Labs  Lab 06/18/19 1556 06/18/19 2046 06/19/19 0110 06/19/19 0846 06/19/19 1239  GLUCAP 134* 149* 115* 94 169*    Active Problems:   Hyperlipidemia   Acute on chronic respiratory failure with hypoxia and hypercapnia (HCC)   Obesity, Class III, BMI 40-49.9 (morbid obesity) (HCC)   Thrombocytopenia (HCC)  Time coordinating discharge: 38 minutes.  Signed:        Jaleah Lefevre, DO [facility]  06/19/2019, 5:47 PM

## 2019-06-19 NOTE — Progress Notes (Signed)
New order to discharge patient home.  PIVs and cardiac monitoring discontinued.  Patient had new walker delivered to her room prior to discharge.  Patient uses oxygen at baseline and has a supply at home.  AVS reviewed with patient and DIL.  All questions answered.  AVS given to patient.  Patient to pick up prescriptions at her pharmacy.  Patient transported to main entrance where son and DIL will transport home in personal vehicle.

## 2019-06-19 NOTE — TOC Progression Note (Addendum)
Transition of Care [facility]) - Progression Note    Patient Details  Name: LURINE IMEL MRN: [id number] Date of Birth: 1960-07-11  Transition of Care [facility]) CM/SW [place], RN Phone Number: [phone]  06/19/2019, 2:41 PM  Clinical Narrative:    CM consulted for DME and Home health PT/OT. CM unable to set up [facility] PT /OT due to no agency will accept patient. Dr Benny Lennert made aware. Patient states that she does not need the rolling walker because she has one at home.  Patient reports that she has home O2 through [facility]. Family to bring portable tank for transport home. CM will sign off.     Expected Discharge Plan: Home/Self Care Barriers to Discharge: No [facility] will accept this patient  Expected Discharge Plan and Services Expected Discharge Plan: Home/Self Care In-house Referral: NA Discharge Planning Services: CM Consult Post Acute Care Choice: Durable Medical Equipment Living arrangements for the past 2 months: Single Family Home Expected Discharge Date: 06/19/19               DME Arranged: Gilford Rile rolling DME Agency: [employer] Date DME Agency Contacted: 06/19/19 Time DME Agency Contacted: 1440 Representative spoke with at DME Agency: [place]: NA [place]: NA         Social Determinants of Health ([place]) Interventions    Readmission Risk Interventions No flowsheet data found.

## 2019-06-19 NOTE — Progress Notes (Signed)
Occupational Therapy Treatment Patient Details Name: Terri Casey MRN: [id number] DOB: 1960/11/29 Today's Date: 06/19/2019    History of present illness Terri Casey is a 59 y.o. female with medical history of COPD, chronic respiratory failure on 2.5 L, hypertension, hyperlipidemia, hypothyroidism, morbid obesity, nonobstructive CAD, OSA presenting with 2 to 3 days of shortness of breath.  Pt was intubated in the ED, found to have pseudomonas PNA, extubated 6/12.   OT comments  Pt making steady progress towards OT goals this session. Session focus on functional mobility, stand grooming tasks and toileting. Overall, pt completes functional mobility with minguard with RW. Pt on 4L O2 throughout session wth O2 dropping briefly to 88% needing a seated rest break for sats to increase to  greater than 90%. Education provided on toileting aid for home as pt reports fatigue from completing pericare at home. Issued pt ECS handout with pt statings he already knows majority of info, did provide education on using 3n1 on toilet as pt reports difficulty getting on<>off toilet. Agree with DC plan below, will follow acutely.    Follow Up Recommendations  Home health OT;Supervision/Assistance - 24 hour    Equipment Recommendations  3 in 1 bedside commode    Recommendations for Other Services      Precautions / Restrictions Precautions Precautions: Fall Precaution Comments: watch O2 Restrictions Weight Bearing Restrictions: No       Mobility Bed Mobility Overal bed mobility: Modified Independent Bed Mobility: Supine to Sit     Supine to sit: Modified independent (Device/Increase time)     General bed mobility comments: no physical assist needed  Transfers Overall transfer level: Needs assistance Equipment used: Rolling walker (2 wheeled) Transfers: Sit to/from Stand Sit to Stand: Min guard         General transfer comment: min guard for safety during sit<>stand from EOB and  BSC    Balance Overall balance assessment: Needs assistance Sitting-balance support: No upper extremity supported;Feet supported Sitting balance-Leahy Scale: Good     Standing balance support: During functional activity Standing balance-Leahy Scale: Fair Standing balance comment: able to complete pericare in standing with min guard                           ADL either performed or assessed with clinical judgement   ADL Overall ADL's : Needs assistance/impaired     Grooming: Wash/dry hands;Standing;Supervision/safety Grooming Details (indicate cue type and reason): standing at sink                 Toilet Transfer: Min Engineer, site Details (indicate cue type and reason): minguard for line mgmt Toileting- Water quality scientist and Hygiene: Min guard;Sit to/from stand;Set up [place] Manipulation Details (indicate cue type and reason): pt able to complete anterior pericare with minguard; set- up assist needed. education provided on toileting aids as pt reports difficulty completing pericare at home     Functional mobility during ADLs: Min guard;Rolling walker General ADL Comments: pt continues to present with decreased activity tolerance, generalized weakness and decreased ability to care for self.     Vision       Perception     Praxis      Cognition Arousal/Alertness: Awake/alert Behavior During Therapy: WFL for tasks assessed/performed Overall Cognitive Status: Within Functional Limits for tasks assessed  Exercises     Shoulder Instructions       General Comments pt on 4L O2 [place] with O2 91% at start of session, pt de- sat to 88% needing seated rest break but able to rebound back to >90%. Pt reports she has a Psychologist, counselling at home.     Pertinent Vitals/ Pain       Pain Assessment: Faces Faces Pain Scale: Hurts little more Pain Location: shoulders  and back Pain Descriptors / Indicators: Sore Pain Intervention(s): Monitored during session;Repositioned  Home Living                                          Prior Functioning/Environment              Frequency  Min 2X/week        Progress Toward Goals  OT Goals(current goals can now be found in the care plan section)  Progress towards OT goals: Progressing toward goals  Acute Rehab OT Goals Patient Stated Goal: get back home OT Goal Formulation: With patient Time For Goal Achievement: 07/01/19 Potential to Achieve Goals: Good  Plan Discharge plan remains appropriate;Frequency remains appropriate    Co-evaluation                 AM-PAC OT "6 Clicks" Daily Activity     Outcome Measure   Help from another person eating meals?: A Little Help from another person taking care of personal grooming?: A Little Help from another person toileting, which includes using toliet, bedpan, or urinal?: A Little Help from another person bathing (including washing, rinsing, drying)?: A Little Help from another person to put on and taking off regular upper body clothing?: A Little Help from another person to put on and taking off regular lower body clothing?: A Little 6 Click Score: 18    End of Session Equipment Utilized During Treatment: Gait belt;Rolling walker;Oxygen;Other (comment) (4L)  OT Visit Diagnosis: Other abnormalities of gait and mobility (R26.89);Other symptoms and signs involving cognitive function   Activity Tolerance Patient tolerated treatment well   Patient Left in bed;with call bell/phone within reach   Nurse Communication Mobility status        Time: [id number] OT Time Calculation (min): 33 min  Charges: OT General Charges $OT Visit: 1 Visit OT Treatments $Self Care/Home Management : 23-37 mins  Lanier Clam., COTA/L Acute Rehabilitation Services [phone]    Ihor Gully 06/19/2019, 1:14 PM

## 2019-08-08 ENCOUNTER — Emergency Department (HOSPITAL_COMMUNITY): Payer: Medicaid Other

## 2019-08-08 ENCOUNTER — Other Ambulatory Visit: Payer: Self-pay

## 2019-08-08 ENCOUNTER — Inpatient Hospital Stay (HOSPITAL_COMMUNITY)
Admission: EM | Admit: 2019-08-08 | Discharge: 2019-08-10 | DRG: 190 | Disposition: A | Discharge status: Home / Self Care | Payer: Medicaid Other | Attending: Internal Medicine | Admitting: Internal Medicine

## 2019-08-08 ENCOUNTER — Encounter (HOSPITAL_COMMUNITY): Payer: Self-pay | Admitting: Emergency Medicine

## 2019-08-08 DIAGNOSIS — Z7951 Long term (current) use of inhaled steroids: Secondary | ICD-10-CM

## 2019-08-08 DIAGNOSIS — E039 Hypothyroidism, unspecified: Secondary | ICD-10-CM | POA: Diagnosis present

## 2019-08-08 DIAGNOSIS — Z6841 Body Mass Index (BMI) 40.0 and over, adult: Secondary | ICD-10-CM | POA: Diagnosis not present

## 2019-08-08 DIAGNOSIS — Z79899 Other long term (current) drug therapy: Secondary | ICD-10-CM

## 2019-08-08 DIAGNOSIS — I4891 Unspecified atrial fibrillation: Secondary | ICD-10-CM | POA: Diagnosis present

## 2019-08-08 DIAGNOSIS — E875 Hyperkalemia: Secondary | ICD-10-CM | POA: Diagnosis present

## 2019-08-08 DIAGNOSIS — G9341 Metabolic encephalopathy: Secondary | ICD-10-CM | POA: Diagnosis present

## 2019-08-08 DIAGNOSIS — E119 Type 2 diabetes mellitus without complications: Secondary | ICD-10-CM | POA: Diagnosis present

## 2019-08-08 DIAGNOSIS — J9601 Acute respiratory failure with hypoxia: Secondary | ICD-10-CM

## 2019-08-08 DIAGNOSIS — J9622 Acute and chronic respiratory failure with hypercapnia: Secondary | ICD-10-CM | POA: Diagnosis present

## 2019-08-08 DIAGNOSIS — N179 Acute kidney failure, unspecified: Secondary | ICD-10-CM | POA: Diagnosis present

## 2019-08-08 DIAGNOSIS — Z7901 Long term (current) use of anticoagulants: Secondary | ICD-10-CM

## 2019-08-08 DIAGNOSIS — I509 Heart failure, unspecified: Secondary | ICD-10-CM | POA: Diagnosis present

## 2019-08-08 DIAGNOSIS — J9621 Acute and chronic respiratory failure with hypoxia: Secondary | ICD-10-CM | POA: Diagnosis present

## 2019-08-08 DIAGNOSIS — W19XXXA Unspecified fall, initial encounter: Secondary | ICD-10-CM | POA: Diagnosis present

## 2019-08-08 DIAGNOSIS — I10 Essential (primary) hypertension: Secondary | ICD-10-CM | POA: Diagnosis present

## 2019-08-08 DIAGNOSIS — F17210 Nicotine dependence, cigarettes, uncomplicated: Secondary | ICD-10-CM | POA: Diagnosis present

## 2019-08-08 DIAGNOSIS — J441 Chronic obstructive pulmonary disease with (acute) exacerbation: Secondary | ICD-10-CM | POA: Diagnosis present

## 2019-08-08 DIAGNOSIS — F17200 Nicotine dependence, unspecified, uncomplicated: Secondary | ICD-10-CM | POA: Diagnosis not present

## 2019-08-08 DIAGNOSIS — Z79890 Hormone replacement therapy (postmenopausal): Secondary | ICD-10-CM

## 2019-08-08 DIAGNOSIS — G4730 Sleep apnea, unspecified: Secondary | ICD-10-CM | POA: Diagnosis not present

## 2019-08-08 DIAGNOSIS — Z20822 Contact with and (suspected) exposure to covid-19: Secondary | ICD-10-CM | POA: Diagnosis present

## 2019-08-08 DIAGNOSIS — G4733 Obstructive sleep apnea (adult) (pediatric): Secondary | ICD-10-CM | POA: Diagnosis present

## 2019-08-08 DIAGNOSIS — Z720 Tobacco use: Secondary | ICD-10-CM | POA: Diagnosis present

## 2019-08-08 DIAGNOSIS — J471 Bronchiectasis with (acute) exacerbation: Principal | ICD-10-CM | POA: Diagnosis present

## 2019-08-08 DIAGNOSIS — E66813 Obesity, class 3: Secondary | ICD-10-CM | POA: Diagnosis present

## 2019-08-08 DIAGNOSIS — J9602 Acute respiratory failure with hypercapnia: Secondary | ICD-10-CM

## 2019-08-08 HISTORY — DX: Heart failure, unspecified: I50.9

## 2019-08-08 LAB — BLOOD GAS, ARTERIAL
Acid-Base Excess: 15.4 mmol/L — ABNORMAL HIGH (ref 0.0–2.0)
Acid-Base Excess: 16.5 mmol/L — ABNORMAL HIGH (ref 0.0–2.0)
Bicarbonate: 35.2 mmol/L — ABNORMAL HIGH (ref 20.0–28.0)
Bicarbonate: 36.8 mmol/L — ABNORMAL HIGH (ref 20.0–28.0)
FIO2: 40
FIO2: 45
O2 Saturation: 91.9 %
O2 Saturation: 92.8 %
Patient temperature: 36.8
Patient temperature: 37
pCO2 arterial: 107 mmHg (ref 32.0–48.0)
pCO2 arterial: 99.9 mmHg (ref 32.0–48.0)
pH, Arterial: 7.231 — ABNORMAL LOW (ref 7.350–7.450)
pH, Arterial: 7.266 — ABNORMAL LOW (ref 7.350–7.450)
pO2, Arterial: 64.2 mmHg — ABNORMAL LOW (ref 83.0–108.0)
pO2, Arterial: 67.2 mmHg — ABNORMAL LOW (ref 83.0–108.0)

## 2019-08-08 LAB — MAGNESIUM: Magnesium: 2 mg/dL (ref 1.7–2.4)

## 2019-08-08 LAB — BASIC METABOLIC PANEL
Anion gap: 10 (ref 5–15)
BUN: 9 mg/dL (ref 6–20)
CO2: 39 mmol/L — ABNORMAL HIGH (ref 22–32)
Calcium: 8.9 mg/dL (ref 8.9–10.3)
Chloride: 92 mmol/L — ABNORMAL LOW (ref 98–111)
Creatinine, Ser: 1.27 mg/dL — ABNORMAL HIGH (ref 0.44–1.00)
GFR calc Af Amer: 53 mL/min — ABNORMAL LOW (ref >60)
GFR calc non Af Amer: 46 mL/min — ABNORMAL LOW (ref >60)
Glucose, Bld: 124 mg/dL — ABNORMAL HIGH (ref 70–99)
Potassium: 4.4 mmol/L (ref 3.5–5.1)
Sodium: 141 mmol/L (ref 135–145)

## 2019-08-08 LAB — BRAIN NATRIURETIC PEPTIDE: B Natriuretic Peptide: 213 pg/mL — ABNORMAL HIGH (ref 0.0–100.0)

## 2019-08-08 LAB — CBC
HCT: 46.9 % — ABNORMAL HIGH (ref 36.0–46.0)
Hemoglobin: 12.9 g/dL (ref 12.0–15.0)
MCH: 32.3 pg (ref 26.0–34.0)
MCHC: 27.5 g/dL — ABNORMAL LOW (ref 30.0–36.0)
MCV: 117.5 fL — ABNORMAL HIGH (ref 80.0–100.0)
Platelets: 209 10*3/uL (ref 150–400)
RBC: 3.99 MIL/uL (ref 3.87–5.11)
RDW: 14 % (ref 11.5–15.5)
WBC: 10.4 10*3/uL (ref 4.0–10.5)
nRBC: 0 % (ref 0.0–0.2)

## 2019-08-08 LAB — PROCALCITONIN: Procalcitonin: 0.27 ng/mL

## 2019-08-08 LAB — SARS CORONAVIRUS 2 BY RT PCR (HOSPITAL ORDER, PERFORMED IN ~~LOC~~ HOSPITAL LAB): SARS Coronavirus 2: NEGATIVE

## 2019-08-08 LAB — CBG MONITORING, ED: Glucose-Capillary: 87 mg/dL (ref 70–99)

## 2019-08-08 MED ORDER — ACETAMINOPHEN 325 MG PO TABS: 650.0000 mg | ORAL_TABLET | Freq: Four times a day (QID) | ORAL | Status: DC | PRN

## 2019-08-08 MED ORDER — RIVAROXABAN 20 MG PO TABS
20.0000 mg | ORAL_TABLET | Freq: Every day | ORAL | Status: DC
  Administered 2019-08-09 – 2019-08-10 (×2): 20 mg via ORAL
  Filled 2019-08-08 (×2): qty 1

## 2019-08-08 MED ORDER — METHYLPREDNISOLONE SODIUM SUCC 125 MG IJ SOLR
60.0000 mg | Freq: Two times a day (BID) | INTRAMUSCULAR | Status: DC
  Administered 2019-08-08 – 2019-08-10 (×4): 60 mg via INTRAVENOUS
  Filled 2019-08-08 (×4): qty 2

## 2019-08-08 MED ORDER — ALBUTEROL SULFATE HFA 108 (90 BASE) MCG/ACT IN AERS: 2.0000 | INHALATION_SPRAY | Freq: Once | RESPIRATORY_TRACT | Status: DC

## 2019-08-08 MED ORDER — ONDANSETRON HCL 4 MG/2ML IJ SOLN: 4.0000 mg | Freq: Four times a day (QID) | INTRAMUSCULAR | Status: DC | PRN

## 2019-08-08 MED ORDER — POLYETHYLENE GLYCOL 3350 17 G PO PACK: 17.0000 g | PACK | Freq: Every day | ORAL | Status: DC | PRN

## 2019-08-08 MED ORDER — IPRATROPIUM-ALBUTEROL 0.5-2.5 (3) MG/3ML IN SOLN
3.0000 mL | Freq: Three times a day (TID) | RESPIRATORY_TRACT | Status: DC
  Administered 2019-08-09 – 2019-08-10 (×5): 3 mL via RESPIRATORY_TRACT
  Filled 2019-08-08 (×6): qty 3

## 2019-08-08 MED ORDER — IPRATROPIUM-ALBUTEROL 0.5-2.5 (3) MG/3ML IN SOLN
3.0000 mL | Freq: Once | RESPIRATORY_TRACT | Status: AC
  Administered 2019-08-08: 3 mL via RESPIRATORY_TRACT
  Filled 2019-08-08: qty 3

## 2019-08-08 MED ORDER — INSULIN ASPART 100 UNIT/ML ~~LOC~~ SOLN
0.0000 [IU] | SUBCUTANEOUS | Status: DC
  Administered 2019-08-09: 2 [IU] via SUBCUTANEOUS
  Administered 2019-08-10: 1 [IU] via SUBCUTANEOUS

## 2019-08-08 MED ORDER — SODIUM CHLORIDE 0.9 % IV SOLN
2.0000 g | Freq: Three times a day (TID) | INTRAVENOUS | Status: DC
  Administered 2019-08-08 – 2019-08-09 (×2): 2 g via INTRAVENOUS
  Filled 2019-08-08 (×2): qty 2

## 2019-08-08 MED ORDER — ONDANSETRON HCL 4 MG PO TABS: 4.0000 mg | ORAL_TABLET | Freq: Four times a day (QID) | ORAL | Status: DC | PRN

## 2019-08-08 MED ORDER — BUDESONIDE 0.5 MG/2ML IN SUSP
0.5000 mg | Freq: Two times a day (BID) | RESPIRATORY_TRACT | Status: DC
  Administered 2019-08-08 – 2019-08-10 (×4): 0.5 mg via RESPIRATORY_TRACT
  Filled 2019-08-08 (×5): qty 2

## 2019-08-08 MED ORDER — ACETAMINOPHEN 650 MG RE SUPP: 650.0000 mg | Freq: Four times a day (QID) | RECTAL | Status: DC | PRN

## 2019-08-08 MED ORDER — LEVOTHYROXINE SODIUM 100 MCG PO TABS
200.0000 ug | ORAL_TABLET | Freq: Every day | ORAL | Status: DC
  Administered 2019-08-10: 200 ug via ORAL
  Filled 2019-08-08: qty 2

## 2019-08-08 MED ORDER — SODIUM CHLORIDE 0.9 % IV SOLN: INTRAVENOUS | Status: DC

## 2019-08-08 MED ORDER — SODIUM CHLORIDE 0.9 % IV SOLN
1.0000 g | Freq: Once | INTRAVENOUS | Status: AC
  Administered 2019-08-08: 1 g via INTRAVENOUS
  Filled 2019-08-08: qty 10

## 2019-08-08 MED ORDER — IPRATROPIUM-ALBUTEROL 0.5-2.5 (3) MG/3ML IN SOLN
3.0000 mL | RESPIRATORY_TRACT | Status: DC | PRN
  Administered 2019-08-08 – 2019-08-09 (×2): 3 mL via RESPIRATORY_TRACT
  Filled 2019-08-08 (×2): qty 3

## 2019-08-08 MED ORDER — IPRATROPIUM-ALBUTEROL 0.5-2.5 (3) MG/3ML IN SOLN: 3.0000 mL | Freq: Three times a day (TID) | RESPIRATORY_TRACT | Status: DC

## 2019-08-08 MED ORDER — PROMETHAZINE HCL 25 MG/ML IJ SOLN
12.5000 mg | INTRAMUSCULAR | Status: AC
  Administered 2019-08-08: 12.5 mg via INTRAVENOUS
  Filled 2019-08-08: qty 1

## 2019-08-08 MED ORDER — PANTOPRAZOLE SODIUM 40 MG PO TBEC
40.0000 mg | DELAYED_RELEASE_TABLET | Freq: Every day | ORAL | Status: DC
  Administered 2019-08-09 – 2019-08-10 (×2): 40 mg via ORAL
  Filled 2019-08-08 (×2): qty 1

## 2019-08-08 MED ORDER — SODIUM CHLORIDE 0.9 % IV SOLN
500.0000 mg | INTRAVENOUS | Status: DC
  Administered 2019-08-08: 500 mg via INTRAVENOUS
  Filled 2019-08-08: qty 500

## 2019-08-08 MED ORDER — ATORVASTATIN CALCIUM 20 MG PO TABS
20.0000 mg | ORAL_TABLET | Freq: Every day | ORAL | Status: DC
  Administered 2019-08-09: 20 mg via ORAL
  Filled 2019-08-08: qty 2
  Filled 2019-08-08: qty 1

## 2019-08-08 NOTE — ED Notes (Signed)
Dr E in to assess  Pt complaint of N   meds given

## 2019-08-08 NOTE — ED Notes (Signed)
Covid?  "my son might have it"  Hx CHf, COPD Home O2, 3L  Continues to smoke 1 ppd  Found by son with "something wrong with her oxygen regulator" per EMS  Now more alert   Pt hard of hearing

## 2019-08-08 NOTE — ED Notes (Signed)
resp Robert at beside

## 2019-08-08 NOTE — Progress Notes (Signed)
Pharmacy Antibiotic Note  Terri Casey is a 59 y.o. female admitted on 08/08/2019 with pneumonia.  Pharmacy has been consulted for cefepime dosing. Recent pseudomonas infection per consulting MD.  Plan: cefepime 2gm iv q8h  Height: 5\' 9"  (175.3 cm) Weight: 130.6 kg (288 lb) IBW/kg (Calculated) : 66.2  Temp (24hrs), Avg:98.2 F (36.8 C), Min:98.2 F (36.8 C), Max:98.2 F (36.8 C)  Recent Labs  Lab 08/08/19 1523  WBC 10.4  CREATININE 1.27*    Estimated Creatinine Clearance: 68.4 mL/min (A) (by C-G formula based on SCr of 1.27 mg/dL (H)).    Allergies  Allergen Reactions  . Estrogens Hives  . Mustard [employer]  . Strawberry Extract Hives    Antimicrobials this admission: 8/7 cefepime >> 8/7 ceftriaxone x 1    Microbiology results: 8/7 Covid: negative   Thank you for allowing pharmacy to be a part of this patient's care.  Donna Christen Earla Charlie 08/08/2019 8:45 PM

## 2019-08-08 NOTE — ED Notes (Signed)
Critical call from lab  [id number]  Dr Wilson Singer informed

## 2019-08-08 NOTE — ED Provider Notes (Signed)
[facility] EMERGENCY DEPARTMENT Provider Note   CSN: [id number] Arrival date & time: 08/08/19  1451     History Chief Complaint  Patient presents with  . Shortness of Breath    Terri Casey is a 59 y.o. female.  HPI  59 year old female presenting from home for altered mental status.  Son called EMS because she was confused and seemed short of breath.  Listed history of COPD, asthma, CHF.  Patient is drowsy and a poor historian.  She does answer questions although she is confused.  She tells me that the year is 58.  She has a hard time focusing and her answers are generally unreliable.  Past Medical History:  Diagnosis Date  . Anxiety   . Asthma   . CHF (congestive heart failure) ([place])   . COPD (chronic obstructive pulmonary disease) ([place])   . Diabetes mellitus without complication ([place])   . Hypertension   . Thyroid disease     Patient Active Problem List   Diagnosis Date Noted  . Acute on chronic respiratory failure with hypoxia and hypercapnia ([place]) 06/09/2019  . Obesity, Class III, BMI 40-49.9 (morbid obesity) ([place]) 06/09/2019  . Thrombocytopenia ([place]) 06/09/2019  . 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 Pericardial effusion: Per 2 d echo 03/26/2015 03/27/2015  . Acute on chronic respiratory failure ([place]) 03/26/2015  . PNA (pneumonia) 03/26/2015  . Nocturnal hypoxia 03/25/2015  . Acute respiratory failure with hypoxia ([place]) 03/25/2015  . COPD exacerbation ([place]) 03/25/2015  . COPD (chronic obstructive pulmonary disease) ([place]) 03/25/2015  . Anxiety   . Hypothyroidism 03/03/2015  . Vitamin D deficiency 03/03/2015  . Hyperlipidemia 07/08/2007  . CIGARETTE SMOKER 07/08/2007  . Essential hypertension, benign 07/08/2007  . EMPHYSEMA-on home O2 07/08/2007  . Asthma 07/08/2007  . C O P D 07/08/2007  . Sleep apnea 07/08/2007  . WEIGHT GAIN 07/08/2007    Past Surgical History:  Procedure Laterality Date  . ABDOMINAL HYSTERECTOMY       OB History   No  obstetric history on file.     History reviewed. No pertinent family history.  Social History   Tobacco Use  . Smoking status: Current Every Day Smoker    Packs/day: 0.50  . Smokeless tobacco: Never Used  Substance Use Topics  . Alcohol use: No  . Drug use: No    Home Medications Prior to Admission medications   Medication Sig Start Date End Date Taking? Authorizing Provider  ADVAIR DISKUS 500-50 MCG/DOSE AEPB Inhale 1 puff into the lungs every 12 (twelve) hours. 05/08/19   [provider]  albuterol (PROAIR HFA) 108 (90 Base) MCG/ACT inhaler Inhale 2 puffs into the lungs every 6 (six) hours as needed for wheezing.     [provider]  albuterol (PROVENTIL) (2.5 MG/3ML) 0.083% nebulizer solution Inhale 3 mLs into the lungs in the morning, at noon, in the evening, and at bedtime. 09/10/17   [provider]  albuterol-ipratropium (COMBIVENT) 18-103 MCG/ACT inhaler Inhale into the lungs every 4 (four) hours.    [provider]  amLODipine (NORVASC) 10 MG tablet Take 1 tablet (10 mg total) by mouth daily. 06/20/19   Swayze, Ava, DO  ascorbic acid (GNP VITAMIN C) 1000 MG tablet Take 1,000 mg by mouth daily.    [provider]  atorvastatin (LIPITOR) 20 MG tablet Take 20 mg by mouth at bedtime.     [provider]  bethanechol (URECHOLINE) 10 MG tablet Take 1 tablet (10 mg total) by mouth 3 (  three) times daily. 06/19/19   Swayze, Ava, DO  budesonide (PULMICORT) 0.5 MG/2ML nebulizer solution Take 2 mLs (0.5 mg total) by nebulization 2 (two) times daily. 06/19/19   Swayze, Ava, DO  Cholecalciferol (VITAMIN D3) 5000 units CAPS Take 1 capsule (5,000 Units total) by mouth daily. 02/14/16   Cassandria Anger, MD  cyclobenzaprine (FLEXERIL) 5 MG tablet Take 5 mg by mouth 3 (three) times daily. 02/23/19   [provider]  escitalopram (LEXAPRO) 10 MG tablet Take 10 mg by mouth daily. 05/08/19   [provider]  feeding supplement,  ENSURE ENLIVE, (ENSURE ENLIVE) LIQD Take 237 mLs by mouth 3 (three) times daily between meals. 06/19/19   Swayze, Ava, DO  folic acid (FOLVITE) 1 MG tablet Take 1 mg by mouth daily. 06/09/19   [provider]  ipratropium-albuterol (DUONEB) 0.5-2.5 (3) MG/3ML SOLN Take 3 mLs by nebulization every 2 (two) hours as needed. Use 3 times daily x 4 days, then every 2 hours as needed. 03/29/15   Eugenie Filler, MD  levothyroxine (SYNTHROID) 200 MCG tablet Take 1 tablet (200 mcg total) by mouth daily at 6 (six) AM. 06/20/19   Swayze, Ava, DO  loratadine (CLARITIN) 10 MG tablet Take 10 mg by mouth daily.    [provider]  pantoprazole (PROTONIX) 40 MG tablet Take 1 tablet (40 mg total) by mouth daily. 06/20/19   Swayze, Ava, DO  polyethylene glycol (MIRALAX / GLYCOLAX) 17 g packet Take 17 g by mouth daily. 06/20/19   Swayze, Ava, DO  senna-docusate (SENOKOT-S) 8.6-50 MG tablet Take 2 tablets by mouth 2 (two) times daily. 03/29/15   Eugenie Filler, MD  sodium chloride (OCEAN) 0.65 % SOLN nasal spray Place 1 spray into both nostrils as needed for congestion. 06/19/19   Swayze, Ava, DO  STIOLTO RESPIMAT 2.5-2.5 MCG/ACT AERS Inhale 2 puffs into the lungs daily. 05/08/19   [provider]  topiramate (TOPAMAX) 100 MG tablet Take 25 mg by mouth daily.     [provider]  Vitamin D, Ergocalciferol, (DRISDOL) 50000 units CAPS capsule TAKE ONE CAPSULE BY MOUTH ONCE A WEEK 10/01/16   Nida, Marella Chimes, MD  XARELTO 20 MG TABS tablet Take 20 mg by mouth daily. 05/08/19   [provider]  Zinc 50 MG TABS Take 1 tablet by mouth daily.    [provider]    Allergies    Estrogens, Mustard seed, and Strawberry extract  Review of Systems   Review of Systems All systems reviewed and negative, other than as noted in HPI.  Physical Exam Updated Vital Signs BP 95/61   Pulse 91   Temp 98.2 F (36.8 C) (Oral)   Resp 18   Ht 5\' 9"  (1.753 m)   Wt 130.6 kg   SpO2  93%   BMI 42.53 kg/m   Physical Exam Vitals and nursing note reviewed.  Constitutional:      General: She is not in acute distress.    Appearance: She is well-developed. She is obese.  HENT:     Head: Normocephalic and atraumatic.  Eyes:     General:        Right eye: No discharge.        Left eye: No discharge.     Conjunctiva/sclera: Conjunctivae normal.  Cardiovascular:     Rate and Rhythm: Normal rate and regular rhythm.     Heart sounds: Normal heart sounds. No murmur heard.  No friction rub. No gallop.  Pulmonary:     Effort: Pulmonary effort is normal. No respiratory distress.     Breath sounds: Normal breath sounds.  Abdominal:     General: There is no distension.     Palpations: Abdomen is soft.     Tenderness: There is no abdominal tenderness.  Musculoskeletal:        General: No tenderness.     Cervical back: Neck supple.  Skin:    General: Skin is warm and dry.  Neurological:     Mental Status: She is alert.     Motor: No weakness.     Comments: Oriented to self only. Awake. Answering questions but loses train of thought easily and needs frequent redirection.      ED Results / Procedures / Treatments   Labs (all labs ordered are listed, but only abnormal results are displayed) Labs Reviewed  CBC - Abnormal; Notable for the following components:      Result Value   HCT 46.9 (*)    MCV 117.5 (*)    MCHC 27.5 (*)    All other components within normal limits  BLOOD GAS, ARTERIAL - Abnormal; Notable for the following components:   pH, Arterial 7.231 (*)    pCO2 arterial 107 (*)    pO2, Arterial 64.2 (*)    Bicarbonate 35.2 (*)    Acid-Base Excess 15.4 (*)    All other components within normal limits  BASIC METABOLIC PANEL - Abnormal; Notable for the following components:   Chloride 92 (*)    CO2 39 (*)    Glucose, Bld 124 (*)    Creatinine, Ser 1.27 (*)    GFR calc non Af Amer 46 (*)    GFR calc Af Amer 53 (*)    All other components within  normal limits  SARS CORONAVIRUS 2 BY RT PCR (HOSPITAL ORDER, [place] [facility])  BRAIN NATRIURETIC PEPTIDE    EKG None  Radiology DG Chest Portable 1 View  Result Date: 08/08/2019 CLINICAL DATA:  COPD, shortness of breath EXAM: PORTABLE CHEST 1 VIEW COMPARISON:  06/13/2019 FINDINGS: Patchy bilateral lower lobe airspace opacities, left greater than right. Heart is upper limits normal in size. No effusions or pneumothorax. No acute bony abnormality. IMPRESSION: Patchy bilateral lower lobe opacities, left greater than right which could reflect atelectasis or pneumonia. Electronically Signed   By: Rolm Baptise M.D.   On: 08/08/2019 15:44    Procedures Procedures (including critical care time)  CRITICAL CARE Performed by: Virgel Manifold Total critical care time: 35 minutes Critical care time was exclusive of separately billable procedures and treating other patients. Critical care was necessary to treat or prevent imminent or life-threatening deterioration. Critical care was time spent personally by me on the following activities: development of treatment plan with patient and/or surrogate as well as nursing, discussions with consultants, evaluation of patient's response to treatment, examination of patient, obtaining history from patient or surrogate, ordering and performing treatments and interventions, ordering and review of laboratory studies, ordering and review of radiographic studies, pulse oximetry and re-evaluation of patient's condition.   Medications Ordered in ED Medications  cefTRIAXone (ROCEPHIN) 1 g in sodium chloride 0.9 % 100 mL IVPB (has no administration in time range)  azithromycin (ZITHROMAX) 500 mg in sodium chloride 0.9 % 250 mL IVPB (has no administration in time range)  ipratropium-albuterol (DUONEB) 0.5-2.5 (3) MG/3ML nebulizer solution 3 mL (3 mLs Nebulization Given 08/08/19 1648)    ED Course  I have reviewed the  triage vital signs and the  nursing notes.  Pertinent labs & imaging results that were available during my care of the patient were reviewed by me and considered in my medical decision making (see chart for details).    MDM Rules/Calculators/A&P                          62-year-old female with altered mental status.  Likely secondary to hypercarbia.  She is confused but protecting her airway.  Will place her on BiPAP.  Covid negative.  Chest x-ray with infiltrate versus atelectasis.  Afebrile.  No leukocytosis.  Will cover with antibiotics for the time being allowed not convinced that this is an infectious process.  Final Clinical Impression(s) / ED Diagnoses Final diagnoses:  Acute respiratory failure with hypoxia and hypercapnia [facility])    Rx / DC Orders ED Discharge Orders    None       Virgel Manifold, MD 08/10/19 [phone]

## 2019-08-08 NOTE — ED Notes (Signed)
Call from Daughter in law   Pecola Leisure [phone]  Update given

## 2019-08-08 NOTE — ED Notes (Addendum)
Critical PCO2 99.9    Dr Wilson Singer apprised  Dr Marcelina Morel apprised

## 2019-08-08 NOTE — ED Notes (Signed)
Report to Ginger, RN 

## 2019-08-08 NOTE — H&P (Signed)
History and Physical    Terri Casey ZTI:[id number] DOB: [birth year] DOA: 08/08/2019  PCP: Patient, No Pcp Per   Patient coming from: Home  I have personally briefly reviewed patient's old medical records in [place]  Chief Complaint: Altered mental status  HPI: Terri Casey is a 59 y.o. female with medical history significant for  COPD/asthma with chronic respiratory failure, OSA, DM, HTN.  EMS Was called by son, because patient was minimally responsive. At the time of my evaluation, patient mental status has improved, she is on BIPAP. She tells me she has been having difficulty breathing over the past 2 days with cough. She reports compliance with her BIPAP at night and xarelto. She is on 4 L O2.  No chest pain, no fever. No vomiting or loose stools.  Also reports she fell about a week ago on her right knee because she was dizzy.  Hospitalization 6/8 - 6/18 for acute on chronic hypoxic/hypercarbic respiratory failure, required intubation 6/8 - 6/12, found to have pseudomonas PNA. Had delirium post extubation requiring Precedex.   ED Course: temp 98.2, heart rate 90s. Initially placed NRB, then switched to BIPAP with ABG showing PCO2 of 107, and PH 7.23. BNP 213.  WBC 10.4.  Port Chest- atelectasis or PNA. EKG- sinus tach. IV ceftriazone and azithromycin given.   Review of Systems: As per HPI all other systems reviewed and negative.  Past Medical History:  Diagnosis Date  . Anxiety   . Asthma   . CHF (congestive heart failure) ([place])   . COPD (chronic obstructive pulmonary disease) ([place])   . Diabetes mellitus without complication ([place])   . Hypertension   . Thyroid disease     Past Surgical History:  Procedure Laterality Date  . ABDOMINAL HYSTERECTOMY       reports that she has been smoking. She has been smoking about 0.50 packs per day. She has never used smokeless tobacco. She reports that she does not drink alcohol and does not use drugs.  Allergies    Allergen Reactions  . Estrogens Hives  . Mustard [employer]  . Strawberry Extract Hives   Unable to ascertain family hx, on BIPAP.  Prior to Admission medications   Medication Sig Start Date End Date Taking? Authorizing Provider  ADVAIR DISKUS 500-50 MCG/DOSE AEPB Inhale 1 puff into the lungs every 12 (twelve) hours. 05/08/19  Yes [provider]  albuterol (PROAIR HFA) 108 (90 Base) MCG/ACT inhaler Inhale 2 puffs into the lungs every 6 (six) hours as needed for wheezing.    Yes [provider]  albuterol (PROVENTIL) (2.5 MG/3ML) 0.083% nebulizer solution Inhale 3 mLs into the lungs in the morning, at noon, in the evening, and at bedtime. 09/10/17  Yes [provider]  amLODipine (NORVASC) 10 MG tablet Take 1 tablet (10 mg total) by mouth daily. 06/20/19  Yes Swayze, Ava, DO  ascorbic acid (GNP VITAMIN C) 1000 MG tablet Take 1,000 mg by mouth daily.   Yes [provider]  atorvastatin (LIPITOR) 20 MG tablet Take 20 mg by mouth at bedtime.    Yes [provider]  bethanechol (URECHOLINE) 5 MG tablet Take 5 mg by mouth 2 (two) times daily. 08/06/19  Yes [provider]  budesonide (PULMICORT) 0.5 MG/2ML nebulizer solution Take 2 mLs (0.5 mg total) by nebulization 2 (two) times daily. 06/19/19  Yes Swayze, Ava, DO  Cholecalciferol (VITAMIN D3) 5000 units CAPS Take 1 capsule (5,000 Units total) by mouth daily. 02/14/16  Yes Cassandria Anger, MD  cyclobenzaprine (FLEXERIL) 5 MG tablet Take 5 mg by mouth 3 (three) times daily. 02/23/19  Yes [provider]  feeding supplement, ENSURE ENLIVE, (ENSURE ENLIVE) LIQD Take 237 mLs by mouth 3 (three) times daily between meals. 06/19/19  Yes Swayze, Ava, DO  folic acid (FOLVITE) 1 MG tablet Take 1 mg by mouth daily. 06/09/19  Yes [provider]  ipratropium-albuterol (DUONEB) 0.5-2.5 (3) MG/3ML SOLN Take 3 mLs by nebulization every 2 (two) hours as needed. Use 3 times daily x 4 days, then  every 2 hours as needed. 03/29/15  Yes Eugenie Filler, MD  levothyroxine (SYNTHROID) 200 MCG tablet Take 1 tablet (200 mcg total) by mouth daily at 6 (six) AM. 06/20/19  Yes Swayze, Ava, DO  loratadine (CLARITIN) 10 MG tablet Take 10 mg by mouth daily.   Yes [provider]  oxyCODONE-acetaminophen (PERCOCET) 10-325 MG tablet Take 1 tablet by mouth 4 (four) times daily as needed for pain. 07/24/19  Yes [provider]  pantoprazole (PROTONIX) 40 MG tablet Take 1 tablet (40 mg total) by mouth daily. 06/20/19  Yes Swayze, Ava, DO  STIOLTO RESPIMAT 2.5-2.5 MCG/ACT AERS Inhale 2 puffs into the lungs daily. 05/08/19  Yes [provider]  Vitamin D, Ergocalciferol, (DRISDOL) 50000 units CAPS capsule TAKE ONE CAPSULE BY MOUTH ONCE A WEEK 10/01/16  Yes Nida, Marella Chimes, MD  XARELTO 20 MG TABS tablet Take 20 mg by mouth daily. 05/08/19  Yes [provider]  Zinc 50 MG TABS Take 1 tablet by mouth daily.   Yes [provider]  albuterol-ipratropium (COMBIVENT) 18-103 MCG/ACT inhaler Inhale into the lungs every 4 (four) hours.    [provider]    Physical Exam: Vitals:   08/08/19 1631 08/08/19 1648 08/08/19 1655 08/08/19 1701  BP: 105/63   97/65  Pulse: 91  91 92  Resp: 15  20 17   Temp:      TempSrc:      SpO2: 96% 95%  93%  Weight:      Height:        Constitutional: Bipap removed temporarily as she reported nausea, but did not vomit. Vitals:   08/08/19 1631 08/08/19 1648 08/08/19 1655 08/08/19 1701  BP: 105/63   97/65  Pulse: 91  91 92  Resp: 15  20 17   Temp:      TempSrc:      SpO2: 96% 95%  93%  Weight:      Height:       Eyes: PERRL, lids and conjunctivae normal ENMT: Mucous membranes are very dry, with scaling on lips,and mild crusting Neck: normal, supple, no masses, no thyromegaly Respiratory: Reduced air entry bilat, no appreciable wheezing, no crackles. Mild increased work of breathing. Cardiovascular: Regular rate and  rhythm, no murmurs / rubs / gallops. No extremity edema. 2+ pedal pulses.   Abdomen: no tenderness, no masses palpated. No hepatosplenomegaly. Bowel sounds positive.  Musculoskeletal: no clubbing / cyanosis. No joint deformity upper and lower extremities. Good ROM, no contractures. Normal muscle tone.  Skin: no rashes, lesions, ulcers. No induration Neurologic: Moving extremities spontaneously, no apparent cranial nerve abnormality Psychiatric: Awake, lethargic, knows name and where she is, but doesn't know how or why she is in the hospital   Labs on Admission: I have personally reviewed following labs and imaging studies  CBC: Recent Labs  Lab 08/08/19 1523  WBC 10.4  HGB 12.9  HCT 46.9*  MCV 117.5*  PLT  017   Basic Metabolic Panel: Recent Labs  Lab 08/08/19 1523  NA 141  K 4.4  CL 92*  CO2 39*  GLUCOSE 124*  BUN 9  CREATININE 1.27*  CALCIUM 8.9    Radiological Exams on Admission: DG Chest Portable 1 View  Result Date: 08/08/2019 CLINICAL DATA:  COPD, shortness of breath EXAM: PORTABLE CHEST 1 VIEW COMPARISON:  06/13/2019 FINDINGS: Patchy bilateral lower lobe airspace opacities, left greater than right. Heart is upper limits normal in size. No effusions or pneumothorax. No acute bony abnormality. IMPRESSION: Patchy bilateral lower lobe opacities, left greater than right which could reflect atelectasis or pneumonia. Electronically Signed   By: Rolm Baptise M.D.   On: 08/08/2019 15:44    EKG: Independently reviewed. Sinus tach 101, QTC prolonged at 493.  Assessment/Plan Principal Problem:   Acute on chronic respiratory failure with hypoxia and hypercapnia (HCC) Active Problems:   CIGARETTE SMOKER   Essential hypertension, benign   Sleep apnea   Hypothyroidism   COPD exacerbation (HCC)   Obesity, Class III, BMI 40-49.9 (morbid obesity) (HCC)   Acute on chronic hypoxic and hypercarbic respiratory failure-  ABG shows PCO2- 107, PH- 7.23. Serum Bicarb 39, O2 sats down to  84% on 6 L. Recently required intubation for similar, treated for Pseudomonas PNA. Port chest- Athelectasis Vs PNA. WBC- 10.4. Rules out for sepsis. Likely combination of COPD exacerbation, Sleep apnea and possibly PNA.  Covid test- negative. - BIPAP - Repeat ABG - IV cefepime for possible PNA and COPD - BMP, CBC in A.m  Metabolic encephalopathy, Fall - from hypercarbia. Right ankle xray without abnormality. - Obtain head ct, as she is on anticoag and reports a fall - BIPAP  - May benefit PT eval when able  AKI- Cr 1.27, baseline 0.5 - 0.8.  - IVF N/s 100cc/hr x 20hrs - BMP a.m  COPD Exacerbation- with respiratory failure - IV cefepime - Duonebs sch and PRN - Resume home inhalers - Mucolytics - IV solumedrol 60 q12h  Atria Fibrillation - Rate controlled, and on chronic anticoag. Not on rate limiting drugs. - Resume xarelto  HTN- Soft, fluctuates - Hold home Norvasc,   DM- random glucose 124. Recent Hgba1c 5.1. Not on medications - SSI - s q6h while on steroids.  DVT prophylaxis: Xarelto Code Status: Full code- Confirmed with patient at bedside. Family Communication: None at bedside Disposition Plan: > 2 days, pending improvement in respiratory status. Consults called: None Admission status: Inpatient, tele I certify that at the point of admission it is my clinical judgment that the patient will require inpatient hospital care spanning beyond 2 midnights from the point of admission due to high intensity of service, high risk for further deterioration and high frequency of surveillance required.    Bethena Roys MD [facility]  08/08/2019, 8:28 PM

## 2019-08-08 NOTE — ED Notes (Signed)
Call to daughter   Bonita Quin [phone]  Questions answered and update given   She is apprised that this is the number given to the hospitalist and she was asked to call when she could

## 2019-08-08 NOTE — ED Triage Notes (Addendum)
Pt called out by pt's son for SOB and being minimally responsive, upon arrival O2 "less than 50%", pt rcvd a new O2 concentrator yesterday that appears to not have been working, [id number], hx of COPD and CHF, on 12L O2 98%, lung sounds are diminished with expiratory wheezing

## 2019-08-08 NOTE — ED Notes (Signed)
In to hang pt's IVF and found pt lying on her side with BIPAP off as well as continuous O2 monitor. Replaced BIPAP and O2 monitor. Sats were 33% when BIPAP replaced. Continued to monitor pt until sats back to WNL. Educated pt on need to keep BIPAP on as well as all monitoring equipment.

## 2019-08-09 ENCOUNTER — Other Ambulatory Visit: Payer: Self-pay

## 2019-08-09 DIAGNOSIS — I10 Essential (primary) hypertension: Secondary | ICD-10-CM

## 2019-08-09 DIAGNOSIS — J441 Chronic obstructive pulmonary disease with (acute) exacerbation: Secondary | ICD-10-CM

## 2019-08-09 DIAGNOSIS — E039 Hypothyroidism, unspecified: Secondary | ICD-10-CM

## 2019-08-09 DIAGNOSIS — G4730 Sleep apnea, unspecified: Secondary | ICD-10-CM

## 2019-08-09 LAB — CBG MONITORING, ED
Glucose-Capillary: 100 mg/dL — ABNORMAL HIGH (ref 70–99)
Glucose-Capillary: 100 mg/dL — ABNORMAL HIGH (ref 70–99)
Glucose-Capillary: 94 mg/dL (ref 70–99)

## 2019-08-09 LAB — GLUCOSE, CAPILLARY
Glucose-Capillary: 119 mg/dL — ABNORMAL HIGH (ref 70–99)
Glucose-Capillary: 154 mg/dL — ABNORMAL HIGH (ref 70–99)
Glucose-Capillary: 91 mg/dL (ref 70–99)

## 2019-08-09 LAB — BLOOD GAS, VENOUS
Acid-Base Excess: 16.8 mmol/L — ABNORMAL HIGH (ref 0.0–2.0)
Bicarbonate: 36.8 mmol/L — ABNORMAL HIGH (ref 20.0–28.0)
FIO2: 45
O2 Saturation: 52 %
Patient temperature: 36.8
pCO2, Ven: 88.8 mmHg (ref 44.0–60.0)
pH, Ven: 7.313 (ref 7.250–7.430)
pO2, Ven: <31 mmHg — CL (ref 32.0–45.0)

## 2019-08-09 LAB — CBC
HCT: 42.9 % (ref 36.0–46.0)
Hemoglobin: 12.1 g/dL (ref 12.0–15.0)
MCH: 32.4 pg (ref 26.0–34.0)
MCHC: 28.2 g/dL — ABNORMAL LOW (ref 30.0–36.0)
MCV: 115 fL — ABNORMAL HIGH (ref 80.0–100.0)
Platelets: 173 10*3/uL (ref 150–400)
RBC: 3.73 MIL/uL — ABNORMAL LOW (ref 3.87–5.11)
RDW: 14.2 % (ref 11.5–15.5)
WBC: 6.2 10*3/uL (ref 4.0–10.5)
nRBC: 0 % (ref 0.0–0.2)

## 2019-08-09 LAB — BLOOD GAS, ARTERIAL
Acid-base deficit: 7.7 mmol/L — ABNORMAL HIGH (ref 0.0–2.0)
Bicarbonate: 17.6 mmol/L — ABNORMAL LOW (ref 20.0–28.0)
FIO2: 45
O2 Saturation: 57.9 %
Patient temperature: 36.8
pCO2 arterial: 43.5 mmHg (ref 32.0–48.0)
pH, Arterial: 7.246 — ABNORMAL LOW (ref 7.350–7.450)
pO2, Arterial: 32.5 mmHg — CL (ref 83.0–108.0)

## 2019-08-09 LAB — BASIC METABOLIC PANEL
Anion gap: 11 (ref 5–15)
BUN: 13 mg/dL (ref 6–20)
CO2: 37 mmol/L — ABNORMAL HIGH (ref 22–32)
Calcium: 8.6 mg/dL — ABNORMAL LOW (ref 8.9–10.3)
Chloride: 94 mmol/L — ABNORMAL LOW (ref 98–111)
Creatinine, Ser: 0.97 mg/dL (ref 0.44–1.00)
GFR calc Af Amer: >60 mL/min (ref >60)
GFR calc non Af Amer: >60 mL/min (ref >60)
Glucose, Bld: 93 mg/dL (ref 70–99)
Potassium: 5.4 mmol/L — ABNORMAL HIGH (ref 3.5–5.1)
Sodium: 142 mmol/L (ref 135–145)

## 2019-08-09 LAB — MRSA PCR SCREENING: MRSA by PCR: NEGATIVE

## 2019-08-09 MED ORDER — CHLORHEXIDINE GLUCONATE CLOTH 2 % EX PADS
6.0000 | MEDICATED_PAD | Freq: Every day | CUTANEOUS | Status: DC
  Administered 2019-08-09 – 2019-08-10 (×2): 6 via TOPICAL

## 2019-08-09 MED ORDER — DM-GUAIFENESIN ER 30-600 MG PO TB12
1.0000 | ORAL_TABLET | Freq: Two times a day (BID) | ORAL | Status: DC
  Administered 2019-08-09 – 2019-08-10 (×3): 1 via ORAL
  Filled 2019-08-09 (×3): qty 1

## 2019-08-09 MED ORDER — DOXYCYCLINE HYCLATE 100 MG PO TABS
100.0000 mg | ORAL_TABLET | Freq: Two times a day (BID) | ORAL | Status: DC
  Administered 2019-08-09 – 2019-08-10 (×3): 100 mg via ORAL
  Filled 2019-08-09 (×3): qty 1

## 2019-08-09 NOTE — Progress Notes (Signed)
Paged Dr. Dyann Kief lab of PCO2 88.8, PO2 less than 31

## 2019-08-09 NOTE — Progress Notes (Signed)
Pt removed from Bipap at this time. Placed on 6lpm [place] to maintain SPO2. She is comfortable without distress. She is alert and oriented at this time.

## 2019-08-09 NOTE — ED Notes (Signed)
Pt had turned over in bed and disconnected tubing from BIPAP. Sats were 80%. Re-connected tubing and continued to stay with pt until sats WNL. Will continue to monitor.

## 2019-08-09 NOTE — Progress Notes (Signed)
Tubing to patients CPAP mask was disconnected and SpO2 was 76%. Tubing attached,  masked reseated and waiting for patient to regulated before ABG is drawn.Marland Kitchen

## 2019-08-09 NOTE — Progress Notes (Signed)
PROGRESS NOTE    Terri Casey  NAT:[id number] DOB: 30-Jun-1960 DOA: 08/08/2019 PCP: Patient, No Pcp Per    Chief Complaint  Patient presents with  . Shortness of Breath    Brief Narrative:  As per H&P written by Dr. Denton Brick on 08/08/2019 Terri Casey is a 59 y.o. female with medical history significant for  COPD/asthma with chronic respiratory failure, OSA, DM, HTN.  EMS Was called by son, because patient was minimally responsive. At the time of my evaluation, patient mental status has improved, she is on BIPAP. She tells me she has been having difficulty breathing over the past 2 days with cough. She reports compliance with her BIPAP at night and xarelto. She is on 4 L O2.  No chest pain, no fever. No vomiting or loose stools.  Also reports she fell about a week ago on her right knee because she was dizzy.  Hospitalization 6/8 - 6/18 for acute on chronic hypoxic/hypercarbic respiratory failure, required intubation 6/8 - 6/12, found to have pseudomonas PNA.   ED Course: temp 98.2, heart rate 90s. Initially placed NRB, then switched to BIPAP with ABG showing PCO2 of 107, and PH 7.23. BNP 213.  WBC 10.4.  Port Chest- atelectasis or PNA. EKG- sinus tach. IV ceftriazone and azithromycin given.   Assessment & Plan: 1-acute on chronic respiratory failure with hypoxia and hypercapnia (HCC) -Still on BiPAP currently; good mentation and on recent ABG significant improvement in her CO2 level. -Low oxygen has been reported on recent blood work, but given normal mentation and a stable oxygen saturation on physical measures we will recheck ABG. -Patient chronically on 4 L nasal cannula supplementation at home. -If further stabilized will wean BiPAP off, with intention to usages at nighttime -Will advance diet, continue steroids, oral antibiotic and nebulizer management. -Closely monitor in the stepdown unit  2-Essential hypertension, benign -Stable overall -Continue to closely follow  vital signs and resume oral antihypertensive regimen as needed.  3-type 2 diabetes mellitus -Most recent A1c (5.1) demonstrating good control with just diet management. -Continue sliding scale insulin -CBGs might get elevated due to steroids usage.  4-history of atrial fibrillation -No using any rate control agents at this time -Rate is a stable and well controlled. -Continue Xarelto for secondary prevention. -Continue monitor on telemetry -Closely follow electrolytes and replete as needed.  5-mild hyperkalemia -Most likely in the setting of slight hemolysis -Will continue monitoring on telemetry -Patient receiving insulin therapy as part of treatment for diabetes -Continue IV fluids -Not on any potassium supplementation currently. -Will follow electrolytes trend.  6-morbid obesity/Obstructive sleep apnea -Body mass index is 42.53 kg/m. -Low calorie diet, portion control increase physical activity discussed with patient. -Continue the use of BiPAP nightly.  7-Hypothyroidism -Continue Synthroid.  8-COPD exacerbation ([place]) -Most likely with mild bronchiectasis component -No signs of acute pneumonia -We will stop IV antibiotics; use oral doxycycline, continue steroids and nebulizer management. -Chronic on 4 L oxygen supplementation.   DVT prophylaxis: Chronically on Xarelto Code Status: Full code Family Communication: Son over the phone on 08/09/2019 Disposition:   Status is: Inpatient  Dispo: The patient is from: Home              Anticipated d/c is to: Home              Anticipated d/c date is: 1-2 days              Patient currently no medically for discharge at this time; still with  diffuse expiratory wheezing and bilateral rhonchi.  Currently on BiPAP.  She is afebrile and with normal WBCs; IV antibiotics will be discontinued and patient started on doxycycline.  Continue management with steroids, nebulizer and Mucinex.  Will recheck ABG and wean off BiPAP, if capable  to do that we will advance diet.       Consultants:   None   Procedures:  See below for x-ray reports.   Antimicrobials:  Cefepime 08/08/2019>>> 08/09/2019 Doxycycline 08/09/2019 (intention for 5 days of therapy).  Subjective: In no major distress currently; patient is afebrile.  Reports breathing is better and would like to eat something.  Still on BiPAP.  Objective: Vitals:   08/09/19 0734 08/09/19 0743 08/09/19 0800 08/09/19 0830  BP:   108/68 113/73  Pulse: 87  87 85  Resp: 19  19 15   Temp:      TempSrc:      SpO2: 98% 98% 100% 99%  Weight:      Height:        Intake/Output Summary (Last 24 hours) at 08/09/2019 0925 Last data filed at 08/09/2019 4492 Gross per 24 hour  Intake 100.63 ml  Output --  Net 100.63 ml   Filed Weights   08/08/19 1500  Weight: 130.6 kg    Examination:  General exam: Morbidly obese on examination; in no major distress currently.  Reports breathing is better and she is hungry.  Oriented x3.  BiPAP in place. Respiratory system: Positive rhonchi bilaterally, diffuse expiratory wheezing.  No using accessory muscles. Cardiovascular system: S1 & S2, no rubs, no gallops, no murmurs.  Unable to assess JVD with body habitus. Gastrointestinal system: Abdomen is obese, nondistended, soft and nontender. No organomegaly or masses felt. Normal bowel sounds heard. Central nervous system: Alert and oriented. No focal neurological deficits. Extremities: No cyanosis or clubbing Skin: No rashes, no petechiae. Psychiatry: Judgement and insight appear normal. Mood & affect appropriate.     Data Reviewed: I have personally reviewed following labs and imaging studies  CBC: Recent Labs  Lab 08/08/19 1523 08/09/19 0603  WBC 10.4 6.2  HGB 12.9 12.1  HCT 46.9* 42.9  MCV 117.5* 115.0*  PLT 209 010    Basic Metabolic Panel: Recent Labs  Lab 08/08/19 1523 08/09/19 0603  NA 141 142  K 4.4 5.4*  CL 92* 94*  CO2 39* 37*  GLUCOSE 124* 93  BUN 9 13   CREATININE 1.27* 0.97  CALCIUM 8.9 8.6*  MG 2.0  --     GFR: Estimated Creatinine Clearance: 89.6 mL/min (by C-G formula based on SCr of 0.97 mg/dL).  CBG: Recent Labs  Lab 08/08/19 2036 08/09/19 0018 08/09/19 0512 08/09/19 0905  GLUCAP 87 94 100* 100*     Recent Results (from the past 240 hour(s))  SARS Coronavirus 2 by RT PCR (hospital order, performed in [facility] hospital lab) Nasopharyngeal Nasopharyngeal Swab     Status: None   Collection Time: 08/08/19  3:19 PM   Specimen: Nasopharyngeal Swab  Result Value Ref Range Status   SARS Coronavirus 2 NEGATIVE NEGATIVE Final    Comment: (NOTE) SARS-CoV-2 target nucleic acids are NOT DETECTED.  The SARS-CoV-2 RNA is generally detectable in upper and lower respiratory specimens during the acute phase of infection. The lowest concentration of SARS-CoV-2 viral copies this assay can detect is 250 copies / mL. A negative result does not preclude SARS-CoV-2 infection and should not be used as the sole basis for treatment or other patient management decisions.  A negative result may occur with improper specimen collection / handling, submission of specimen other than nasopharyngeal swab, presence of viral mutation(s) within the areas targeted by this assay, and inadequate number of viral copies (<250 copies / mL). A negative result must be combined with clinical observations, patient history, and epidemiological information.  Fact Sheet for Patients:   StrictlyIdeas.no  Fact Sheet for Healthcare Providers: BankingDealers.co.za  This test is not yet approved or  cleared by the [place] FDA and has been authorized for detection and/or diagnosis of SARS-CoV-2 by FDA under an Emergency Use Authorization (EUA).  This EUA will remain in effect (meaning this test can be used) for the duration of the COVID-19 declaration under Section 564(b)(1) of the Act, 21 U.S.C. section  360bbb-3(b)(1), unless the authorization is terminated or revoked sooner.  Performed at [facility], [address] [place], [place] [address]      Radiology Studies: DG Ankle Complete Right  Result Date: 08/08/2019 CLINICAL DATA:  Fall, syncope, lateral ankle bruising EXAM: RIGHT ANKLE - COMPLETE 3+ VIEW COMPARISON:  None FINDINGS: No acute bony abnormality. Specifically, no fracture, subluxation, or dislocation. Joint spaces maintained. Plantar calcaneal spur. IMPRESSION: No acute bony abnormality. Electronically Signed   By: Rolm Baptise M.D.   On: 08/08/2019 17:15   DG Chest Portable 1 View  Result Date: 08/08/2019 CLINICAL DATA:  COPD, shortness of breath EXAM: PORTABLE CHEST 1 VIEW COMPARISON:  06/13/2019 FINDINGS: Patchy bilateral lower lobe airspace opacities, left greater than right. Heart is upper limits normal in size. No effusions or pneumothorax. No acute bony abnormality. IMPRESSION: Patchy bilateral lower lobe opacities, left greater than right which could reflect atelectasis or pneumonia. Electronically Signed   By: Rolm Baptise M.D.   On: 08/08/2019 15:44    Scheduled Meds: . atorvastatin  20 mg Oral QHS  . budesonide  0.5 mg Nebulization BID  . dextromethorphan-guaiFENesin  1 tablet Oral BID  . doxycycline  100 mg Oral Q12H  . insulin aspart  0-9 Units Subcutaneous Q4H  . ipratropium-albuterol  3 mL Nebulization TID  . levothyroxine  200 mcg Oral Q0600  . methylPREDNISolone (SOLU-MEDROL) injection  60 mg Intravenous Q12H  . pantoprazole  40 mg Oral Daily  . rivaroxaban  20 mg Oral Daily   Continuous Infusions: . sodium chloride 100 mL/hr at 08/09/19 0516     LOS: 1 day    Time spent: 35 minutes    Barton Dubois, MD [facility]   To contact the attending provider between 7A-7P or the covering provider during after hours 7P-7A, please log into the web site [email or web address] and access using universal [place] password for that web site. If you do not  have the password, please call the hospital operator.  08/09/2019, 9:25 AM

## 2019-08-09 NOTE — ED Notes (Signed)
ED TO INPATIENT HANDOFF REPORT  ED Nurse Name and Phone #:  [phone]  S Name/Age/Gender Terri Casey 59 y.o. female Room/Bed: APA02/APA02  Code Status   Code Status: Full Code  Home/SNF/Other Home Patient oriented to: self, place, time and situation Is this baseline? Yes   Triage Complete: Triage complete  Chief Complaint Acute respiratory failure ([place]) [J96.00]  Triage Note Pt called out by pt's son for SOB and being minimally responsive, upon arrival O2 "less than 50%", pt rcvd a new O2 concentrator yesterday that appears to not have been working, [id number], hx of COPD and CHF, on 12L O2 98%, lung sounds are diminished with expiratory wheezing     Allergies Allergies  Allergen Reactions  . Estrogens Hives  . Mustard [employer]  . Strawberry Extract Hives    Level of Care/Admitting Diagnosis ED Disposition    ED Disposition Condition Comment   Admit  Hospital Area: [facility] [[id number]]  Level of Care: Stepdown [14]  Covid Evaluation: Confirmed COVID Negative  Diagnosis: Acute respiratory failure ([place]) [518.81.ICD-9-CM]  Admitting Physician: Bethena Roys [[id number]]  Attending Physician: Bethena Roys [phone]  Estimated length of stay: past midnight tomorrow  Certification:: I certify this patient will need inpatient services for at least 2 midnights       B Medical/Surgery History Past Medical History:  Diagnosis Date  . Anxiety   . Asthma   . CHF (congestive heart failure) ([place])   . COPD (chronic obstructive pulmonary disease) ([place])   . Diabetes mellitus without complication ([place])   . Hypertension   . Thyroid disease    Past Surgical History:  Procedure Laterality Date  . ABDOMINAL HYSTERECTOMY       A IV Location/Drains/Wounds Patient Lines/Drains/Airways Status    Active Line/Drains/Airways    Name Placement date Placement time Site Days   Peripheral IV 08/08/19 Anterior;Distal;Right Hand 08/08/19  1600  Hand  1    External Urinary Catheter 08/08/19  1504  --  1          Intake/Output Last 24 hours  Intake/Output Summary (Last 24 hours) at 08/09/2019 7001 Last data filed at 08/09/2019 0935 Gross per 24 hour  Intake 200.63 ml  Output --  Net 200.63 ml    Labs/Imaging Results for orders placed or performed during the hospital encounter of 08/08/19 (from the past 48 hour(s))  SARS Coronavirus 2 by RT PCR (hospital order, performed in [facility] hospital lab) Nasopharyngeal Nasopharyngeal Swab     Status: None   Collection Time: 08/08/19  3:19 PM   Specimen: Nasopharyngeal Swab  Result Value Ref Range   SARS Coronavirus 2 NEGATIVE NEGATIVE    Comment: (NOTE) SARS-CoV-2 target nucleic acids are NOT DETECTED.  The SARS-CoV-2 RNA is generally detectable in upper and lower respiratory specimens during the acute phase of infection. The lowest concentration of SARS-CoV-2 viral copies this assay can detect is 250 copies / mL. A negative result does not preclude SARS-CoV-2 infection and should not be used as the sole basis for treatment or other patient management decisions.  A negative result may occur with improper specimen collection / handling, submission of specimen other than nasopharyngeal swab, presence of viral mutation(s) within the areas targeted by this assay, and inadequate number of viral copies (<250 copies / mL). A negative result must be combined with clinical observations, patient history, and epidemiological information.  Fact Sheet for Patients:   StrictlyIdeas.no  Fact Sheet for Healthcare Providers: BankingDealers.co.za  This test  is not yet approved or  cleared by the [place] and has been authorized for detection and/or diagnosis of SARS-CoV-2 by FDA under an Emergency Use Authorization (EUA).  This EUA will remain in effect (meaning this test can be used) for the duration of the COVID-19 declaration under Section  564(b)(1) of the Act, 21 U.S.C. section 360bbb-3(b)(1), unless the authorization is terminated or revoked sooner.  Performed at [facility], [address] [place], [place] [address]   CBC     Status: Abnormal   Collection Time: 08/08/19  3:23 PM  Result Value Ref Range   WBC 10.4 4.0 - 10.5 K/uL   RBC 3.99 3.87 - 5.11 MIL/uL   Hemoglobin 12.9 12.0 - 15.0 g/dL   HCT 46.9 (H) 36 - 46 %   MCV 117.5 (H) 80.0 - 100.0 fL   MCH 32.3 26.0 - 34.0 pg   MCHC 27.5 (L) 30.0 - 36.0 g/dL   RDW 14.0 11.5 - 15.5 %   Platelets 209 150 - 400 K/uL   nRBC 0.0 0.0 - 0.2 %    Comment: Performed at [facility], [address] [place], [place] [address]  Basic metabolic panel     Status: Abnormal   Collection Time: 08/08/19  3:23 PM  Result Value Ref Range   Sodium 141 135 - 145 mmol/L   Potassium 4.4 3.5 - 5.1 mmol/L   Chloride 92 (L) 98 - 111 mmol/L   CO2 39 (H) 22 - 32 mmol/L   Glucose, Bld 124 (H) 70 - 99 mg/dL    Comment: Glucose reference range applies only to samples taken after fasting for at least 8 hours.   BUN 9 6 - 20 mg/dL   Creatinine, Ser 1.27 (H) 0.44 - 1.00 mg/dL   Calcium 8.9 8.9 - 10.3 mg/dL   GFR calc non Af Amer 46 (L) >60 mL/min   GFR calc Af Amer 53 (L) >60 mL/min   Anion gap 10 5 - 15    Comment: Performed at [facility], [address], [place], [place] [address]  Procalcitonin - Baseline     Status: None   Collection Time: 08/08/19  3:23 PM  Result Value Ref Range   Procalcitonin 0.27 ng/mL    Comment:        Interpretation: PCT (Procalcitonin) <= 0.5 ng/mL: Systemic infection (sepsis) is not likely. Local bacterial infection is possible. (NOTE)       Sepsis PCT Algorithm           Lower Respiratory Tract                                      Infection PCT Algorithm    ----------------------------     ----------------------------         PCT < 0.25 ng/mL                PCT < 0.10 ng/mL          Strongly encourage             Strongly discourage   discontinuation  of antibiotics    initiation of antibiotics    ----------------------------     -----------------------------       PCT 0.25 - 0.50 ng/mL            PCT 0.10 - 0.25 ng/mL  OR       >80% decrease in PCT            Discourage initiation of                                            antibiotics      Encourage discontinuation           of antibiotics    ----------------------------     -----------------------------         PCT >= 0.50 ng/mL              PCT 0.26 - 0.50 ng/mL               AND        <80% decrease in PCT             Encourage initiation of                                             antibiotics       Encourage continuation           of antibiotics    ----------------------------     -----------------------------        PCT >= 0.50 ng/mL                  PCT > 0.50 ng/mL               AND         increase in PCT                  Strongly encourage                                      initiation of antibiotics    Strongly encourage escalation           of antibiotics                                     -----------------------------                                           PCT <= 0.25 ng/mL                                                 OR                                        > 80% decrease in PCT                                      Discontinue / Do not initiate  antibiotics  Performed at [facility], [address] [place], [place] [address]   Magnesium     Status: None   Collection Time: 08/08/19  3:23 PM  Result Value Ref Range   Magnesium 2.0 1.7 - 2.4 mg/dL    Comment: Performed at [facility], [address] [place], [place] [address]  Blood gas, arterial (at [facility])     Status: Abnormal   Collection Time: 08/08/19  3:24 PM  Result Value Ref Range   FIO2 40.00    pH, Arterial 7.231 (L) 7.35 - 7.45   pCO2 arterial 107 (HH) 32 - 48 mmHg    Comment: CRITICAL RESULT CALLED TO, READ BACK BY AND  VERIFIED WITH:  ANNE TUTTLE,RN @1543  08/08/2019 KAY    pO2, Arterial 64.2 (L) 83 - 108 mmHg   Bicarbonate 35.2 (H) 20.0 - 28.0 mmol/L   Acid-Base Excess 15.4 (H) 0.0 - 2.0 mmol/L   O2 Saturation 91.9 %   Patient temperature 36.8    Allens test (pass/fail) PASS PASS    Comment: Performed at [facility], [address] [place], [place] [address]  Brain natriuretic peptide     Status: Abnormal   Collection Time: 08/08/19  3:24 PM  Result Value Ref Range   B Natriuretic Peptide 213.0 (H) 0.0 - 100.0 pg/mL    Comment: Performed at [facility], [address] [place], [place] [address]  CBG monitoring, ED     Status: None   Collection Time: 08/08/19  8:36 PM  Result Value Ref Range   Glucose-Capillary 87 70 - 99 mg/dL    Comment: Glucose reference range applies only to samples taken after fasting for at least 8 hours.   Comment 1 Notify RN   Blood gas, arterial     Status: Abnormal   Collection Time: 08/08/19  8:53 PM  Result Value Ref Range   FIO2 45.00    pH, Arterial 7.266 (L) 7.35 - 7.45   pCO2 arterial 99.9 (HH) 32 - 48 mmHg    Comment: CRITICAL RESULT CALLED TO, READ BACK BY AND VERIFIED WITH: PRUITT,G AT 2108 ON 08/08/2019 BY MOSLEY,J     pO2, Arterial 67.2 (L) 83 - 108 mmHg   Bicarbonate 36.8 (H) 20.0 - 28.0 mmol/L   Acid-Base Excess 16.5 (H) 0.0 - 2.0 mmol/L   O2 Saturation 92.8 %   Patient temperature 37.0    Allens test (pass/fail) PASS PASS    Comment: Performed at [facility], [address] [place], [place] [address]  CBG monitoring, ED     Status: None   Collection Time: 08/09/19 12:18 AM  Result Value Ref Range   Glucose-Capillary 94 70 - 99 mg/dL    Comment: Glucose reference range applies only to samples taken after fasting for at least 8 hours.   Comment 1 Notify RN   CBG monitoring, ED     Status: Abnormal   Collection Time: 08/09/19  5:12 AM  Result Value Ref Range   Glucose-Capillary 100 (H) 70 - 99 mg/dL    Comment: Glucose reference range applies only  to samples taken after fasting for at least 8 hours.  Basic metabolic panel     Status: Abnormal   Collection Time: 08/09/19  6:03 AM  Result Value Ref Range   Sodium 142 135 - 145 mmol/L   Potassium 5.4 (H) 3.5 - 5.1 mmol/L   Chloride 94 (L) 98 - 111 mmol/L   CO2 37 (H) 22 - 32 mmol/L  Glucose, Bld 93 70 - 99 mg/dL    Comment: Glucose reference range applies only to samples taken after fasting for at least 8 hours.   BUN 13 6 - 20 mg/dL   Creatinine, Ser 0.97 0.44 - 1.00 mg/dL   Calcium 8.6 (L) 8.9 - 10.3 mg/dL   GFR calc non Af Amer >60 >60 mL/min   GFR calc Af Amer >60 >60 mL/min   Anion gap 11 5 - 15    Comment: Performed at [facility], [address], [place], [place] [address]  CBC     Status: Abnormal   Collection Time: 08/09/19  6:03 AM  Result Value Ref Range   WBC 6.2 4.0 - 10.5 K/uL   RBC 3.73 (L) 3.87 - 5.11 MIL/uL   Hemoglobin 12.1 12.0 - 15.0 g/dL   HCT 42.9 36 - 46 %   MCV 115.0 (H) 80.0 - 100.0 fL   MCH 32.4 26.0 - 34.0 pg   MCHC 28.2 (L) 30.0 - 36.0 g/dL   RDW 14.2 11.5 - 15.5 %   Platelets 173 150 - 400 K/uL   nRBC 0.0 0.0 - 0.2 %    Comment: Performed at [facility], [address] [place], [place] [address]  Blood gas, arterial     Status: Abnormal   Collection Time: 08/09/19  7:48 AM  Result Value Ref Range   FIO2 45.00    pH, Arterial 7.246 (L) 7.35 - 7.45   pCO2 arterial 43.5 32 - 48 mmHg   pO2, Arterial 32.5 (LL) 83 - 108 mmHg    Comment: CRITICAL RESULT CALLED TO, READ BACK BY AND VERIFIED WITH: Raeden Schippers B @ 0806 ON [id number] BY HENDERSON L.    Bicarbonate 17.6 (L) 20.0 - 28.0 mmol/L   Acid-base deficit 7.7 (H) 0.0 - 2.0 mmol/L   O2 Saturation 57.9 %   Patient temperature 36.8    Allens test (pass/fail) PASS PASS    Comment: Performed at [facility], [address], [place], [place] [address]  CBG monitoring, ED     Status: Abnormal   Collection Time: 08/09/19  9:05 AM  Result Value Ref Range   Glucose-Capillary 100 (H) 70 - 99 mg/dL     Comment: Glucose reference range applies only to samples taken after fasting for at least 8 hours.   DG Ankle Complete Right  Result Date: 08/08/2019 CLINICAL DATA:  Fall, syncope, lateral ankle bruising EXAM: RIGHT ANKLE - COMPLETE 3+ VIEW COMPARISON:  None FINDINGS: No acute bony abnormality. Specifically, no fracture, subluxation, or dislocation. Joint spaces maintained. Plantar calcaneal spur. IMPRESSION: No acute bony abnormality. Electronically Signed   By: Rolm Baptise M.D.   On: 08/08/2019 17:15   DG Chest Portable 1 View  Result Date: 08/08/2019 CLINICAL DATA:  COPD, shortness of breath EXAM: PORTABLE CHEST 1 VIEW COMPARISON:  06/13/2019 FINDINGS: Patchy bilateral lower lobe airspace opacities, left greater than right. Heart is upper limits normal in size. No effusions or pneumothorax. No acute bony abnormality. IMPRESSION: Patchy bilateral lower lobe opacities, left greater than right which could reflect atelectasis or pneumonia. Electronically Signed   By: Rolm Baptise M.D.   On: 08/08/2019 15:44    Pending Labs Unresulted Labs (From admission, onward) Comment          Start     Ordered   08/10/19 7902  Basic metabolic panel  Tomorrow morning,   R        08/09/19 0926   08/09/19 0913  Blood gas,  arterial  Once,   STAT        08/09/19 0912          Vitals/Pain Today's Vitals   08/09/19 0734 08/09/19 0743 08/09/19 0800 08/09/19 0830  BP:   108/68 113/73  Pulse: 87  87 85  Resp: 19  19 15   Temp:      TempSrc:      SpO2: 98% 98% 100% 99%  Weight:      Height:      PainSc:        Isolation Precautions No active isolations  Medications Medications  methylPREDNISolone sodium succinate (SOLU-MEDROL) 125 mg/2 mL injection 60 mg (60 mg Intravenous Given 08/09/19 0957)  atorvastatin (LIPITOR) tablet 20 mg (20 mg Oral Not Given 08/08/19 2128)  budesonide (PULMICORT) nebulizer solution 0.5 mg (0.5 mg Nebulization Given 08/09/19 0734)  ipratropium-albuterol (DUONEB) 0.5-2.5 (3)  MG/3ML nebulizer solution 3 mL (3 mLs Nebulization Given 08/09/19 0007)  levothyroxine (SYNTHROID) tablet 200 mcg (200 mcg Oral Not Given 08/09/19 0517)  pantoprazole (PROTONIX) EC tablet 40 mg (40 mg Oral Given 08/09/19 0958)  rivaroxaban (XARELTO) tablet 20 mg (20 mg Oral Given 08/09/19 0958)  insulin aspart (novoLOG) injection 0-9 Units (0 Units Subcutaneous Not Given 08/09/19 0800)  acetaminophen (TYLENOL) tablet 650 mg (has no administration in time range)    Or  acetaminophen (TYLENOL) suppository 650 mg (has no administration in time range)  ondansetron (ZOFRAN) tablet 4 mg (has no administration in time range)    Or  ondansetron (ZOFRAN) injection 4 mg (has no administration in time range)  polyethylene glycol (MIRALAX / GLYCOLAX) packet 17 g (has no administration in time range)  0.9 %  sodium chloride infusion ( Intravenous Rate/Dose Change 08/09/19 0958)  ipratropium-albuterol (DUONEB) 0.5-2.5 (3) MG/3ML nebulizer solution 3 mL (3 mLs Nebulization Given 08/09/19 0734)  doxycycline (VIBRA-TABS) tablet 100 mg (100 mg Oral Given 08/09/19 0958)  dextromethorphan-guaiFENesin (MUCINEX DM) 30-600 MG per 12 hr tablet 1 tablet (1 tablet Oral Given 08/09/19 0958)  ipratropium-albuterol (DUONEB) 0.5-2.5 (3) MG/3ML nebulizer solution 3 mL (3 mLs Nebulization Given 08/08/19 1648)  cefTRIAXone (ROCEPHIN) 1 g in sodium chloride 0.9 % 100 mL IVPB (0 g Intravenous Stopped 08/08/19 1735)  promethazine (PHENERGAN) injection 12.5 mg (12.5 mg Intravenous Given 08/08/19 1758)    Mobility walks with person assist High fall risk   Focused Assessments    R Recommendations: See Admitting Provider Note  Report given to:   Additional Notes:

## 2019-08-09 NOTE — Progress Notes (Addendum)
Morning blood gas from 8/8 07:48 most likely was a bad sample , 7.246 , 43.5 , 32.5 PO2 most likely [place] -- patient base is deficit 7.7 where she had an excess before. Also THB  is 5.0  On the 3 other  blood gases patient THB had been 12. [place] is not reported in epic even though  blood gas analyzer records after each blood gas.

## 2019-08-09 NOTE — ED Notes (Signed)
Date and time results received: 08/09/19 0814  Test: pO2 Critical Value: 32.5  Name of Provider Notified: [place]  Orders Received? Or Actions Taken?: n/a

## 2019-08-10 DIAGNOSIS — F17200 Nicotine dependence, unspecified, uncomplicated: Secondary | ICD-10-CM

## 2019-08-10 LAB — BASIC METABOLIC PANEL
Anion gap: 10 (ref 5–15)
BUN: 21 mg/dL — ABNORMAL HIGH (ref 6–20)
CO2: 37 mmol/L — ABNORMAL HIGH (ref 22–32)
Calcium: 8.8 mg/dL — ABNORMAL LOW (ref 8.9–10.3)
Chloride: 91 mmol/L — ABNORMAL LOW (ref 98–111)
Creatinine, Ser: 0.82 mg/dL (ref 0.44–1.00)
GFR calc Af Amer: >60 mL/min (ref >60)
GFR calc non Af Amer: >60 mL/min (ref >60)
Glucose, Bld: 117 mg/dL — ABNORMAL HIGH (ref 70–99)
Potassium: 4.8 mmol/L (ref 3.5–5.1)
Sodium: 138 mmol/L (ref 135–145)

## 2019-08-10 LAB — GLUCOSE, CAPILLARY
Glucose-Capillary: 107 mg/dL — ABNORMAL HIGH (ref 70–99)
Glucose-Capillary: 116 mg/dL — ABNORMAL HIGH (ref 70–99)
Glucose-Capillary: 114 mg/dL — ABNORMAL HIGH (ref 70–99)
Glucose-Capillary: 136 mg/dL — ABNORMAL HIGH (ref 70–99)

## 2019-08-10 MED ORDER — DOXYCYCLINE HYCLATE 100 MG PO TABS: 100.0000 mg | ORAL_TABLET | Freq: Two times a day (BID) | ORAL | 0 refills | Status: AC

## 2019-08-10 MED ORDER — PREDNISONE 20 MG PO TABS: ORAL_TABLET | ORAL | 0 refills | Status: DC

## 2019-08-10 MED ORDER — DM-GUAIFENESIN ER 30-600 MG PO TB12: 1.0000 | ORAL_TABLET | Freq: Two times a day (BID) | ORAL | 0 refills | Status: DC

## 2019-08-10 NOTE — Discharge Summary (Signed)
Physician Discharge Summary  JONQUIL STUBBE BJY:[id number] DOB: Nov 22, 1960 DOA: 08/08/2019  PCP: Patient, No Pcp Per  Admit date: 08/08/2019 Discharge date: 08/10/2019  Time spent: 35 minutes  Recommendations for Outpatient Follow-up:  1. Repeat basic metabolic panel to follow trends and renal function 2. reassess blood pressure and adjust antihypertensive treatment as needed 3. continue assisting with smoking cessation. 4. Outpatient follow-up with pulmonologist for PFTs and further adjustment on maintenance therapy for COPD.   Discharge Diagnoses:  Principal Problem:   Acute on chronic respiratory failure with hypoxia and hypercapnia (HCC) Active Problems:   CIGARETTE SMOKER   Essential hypertension, benign   Sleep apnea   Hypothyroidism   COPD exacerbation (HCC)   Obesity, Class III, BMI 40-49.9 (morbid obesity) ([place])   Discharge Condition: Stable and improved.  Patient discharged home with instruction to follow-up with PCP and to use oxygen supplementation and CPAP nightly.  CODE STATUS: Full code.  Diet recommendation: Heart healthy/modified carbohydrate and low calorie diet.  Filed Weights   08/08/19 1500 08/09/19 1044 08/10/19 0400  Weight: 130.6 kg 127.8 kg 129.6 kg    History of present illness:  As per H&P written by Dr. Denton Brick on 08/08/2019 Amos A Helmsis a 59 y.o.femalewith medical history significant forCOPD/asthma with chronic respiratory failure, OSA, DM, HTN.  EMS Was called by son, because patient was minimally responsive. At the time of my evaluation, patient mental status has improved, she is on BIPAP. She tells me she has been having difficulty breathing over the past 2 days with cough. She reports compliance with her BIPAP at night and xarelto. She is on 4 L O2. No chest pain, no fever. No vomiting or loose stools.  Also reports she fell about a week ago on her right knee because she was dizzy.  Hospitalization 6/8 - 6/82for acute on chronic  hypoxic/hypercarbic respiratory failure, required intubation 6/8 - 6/12, found to have pseudomonas PNA.   ED Course:temp 98.2, heart rate 90s. Initially placed NRB, then switched to BIPAP with ABG showing PCO2 of 107, and PH 7.23. BNP 213. WBC 10.4. Port Chest- atelectasis or PNA. EKG- sinus tach. IV ceftriazone and azithromycin given.   Hospital Course:  1-acute on chronic respiratory failure with hypoxia and hypercapnia (HCC) -oriented x3 and in no distress at discharge. -Low oxygen was reported on her blood work on 08/09/2019; most likely venous sample as no correlating with her oxygen saturation and degree of mentation. -Patient chronically on 4 L nasal cannula supplementation at home; and is stable on that amount of oxygen supplementation at discharge.. -Continue the use of CPAP QHS -Will continue as needed bronchodilators and the use of Pulmicort; patient will finish steroids tapering and oral doxycycline.  2-Essential hypertension, benign -Stable overall -Continue home antihypertensive regimen -follow heart healthy diet.  3-type 2 diabetes mellitus -Most recent A1c (5.1) demonstrating good control with just diet management. -Continue modified carbohydrate diet. -Continue to monitor blood sugar.  4-history of atrial fibrillation -No using any rate control agents at this time -Rate has remained stable and well controlled. -Continue Xarelto for secondary prevention. -Closely follow electrolytes as an outpatient.  5-mild hyperkalemia -Most likely in the setting of slight hemolysis -no abnormalities appreciated on telemetry. -At discharge electrolytes within normal limit -repeat basic metabolic panel to follow electrolytes stability. -Not on any potassium supplementation currently.  6-morbid obesity/Obstructive sleep apnea -Body mass index is 42.53 kg/m. -Low calorie diet, portion control increase physical activity discussed with patient. -Continue the use of CPAP  nightly.  7-Hypothyroidism -Continue Synthroid.  8-COPD exacerbation ([place]) -Most likely with mild bronchiectasis component. -No signs of acute pneumonia. -Will stop IV antibiotics; use oral doxycycline, continue steroids and nebulizer management. -Continue chronic on 4 L oxygen supplementation.  Procedures:  See below for x-ray reports  Consultations:  None  Discharge Exam: Vitals:   08/10/19 1400 08/10/19 1606  BP: (!) 149/82   Pulse: 87 86  Resp: 20 19  Temp:  98.5 F (36.9 C)  SpO2: 97% 100%    General: Afebrile, no chest pain, no nausea, no vomiting.  Oriented x3 with good oxygen saturation on chronic 4 liters nasal cannula baseline. Cardiovascular: S1 and S2, no rubs, no gallops, no murmurs.  Unable to assess JVD with body habitus. Respiratory: Improved air movement: Plus respiratory wheezing and rhonchi bilaterally; no crackles.  No using accessory muscle. Abdomen: Obese, soft, nontender, distended, positive bowel sounds Extremities: Trace edema bilaterally, no cyanosis or clubbing.  Discharge Instructions   Discharge Instructions    Diet - low sodium heart healthy   Complete by: As directed    Discharge instructions   Complete by: As directed    Arrange follow-up with PCP in 10 days Take medications as prescribed Make sure to wear your oxygen as instructed 24/7 and to be compliant with the use of CPAP at nighttime. -Follow low-calorie  diet and portion control to assist with weight loss.   Increase activity slowly   Complete by: As directed      Allergies as of 08/10/2019      Reactions   Estrogens Hives   Mustard Seed Hives   Strawberry Extract Hives      Medication List    STOP taking these medications   Advair Diskus 500-50 MCG/DOSE Aepb Generic drug: Fluticasone-Salmeterol   albuterol (2.5 MG/3ML) 0.083% nebulizer solution Commonly known as: PROVENTIL   feeding supplement (ENSURE ENLIVE) Liqd   ProAir HFA 108 (90 Base) MCG/ACT  inhaler Generic drug: albuterol     TAKE these medications   amLODipine 10 MG tablet Commonly known as: NORVASC Take 1 tablet (10 mg total) by mouth daily.   atorvastatin 20 MG tablet Commonly known as: LIPITOR Take 20 mg by mouth at bedtime.   bethanechol 5 MG tablet Commonly known as: URECHOLINE Take 5 mg by mouth 2 (two) times daily.   budesonide 0.5 MG/2ML nebulizer solution Commonly known as: PULMICORT Take 2 mLs (0.5 mg total) by nebulization 2 (two) times daily.   Combivent 18-103 MCG/ACT inhaler Generic drug: albuterol-ipratropium Inhale into the lungs every 4 (four) hours.   ipratropium-albuterol 0.5-2.5 (3) MG/3ML Soln Commonly known as: DUONEB Take 3 mLs by nebulization every 2 (two) hours as needed. Use 3 times daily x 4 days, then every 2 hours as needed.   cyclobenzaprine 5 MG tablet Commonly known as: FLEXERIL Take 5 mg by mouth 3 (three) times daily.   dextromethorphan-guaiFENesin 30-600 MG 12hr tablet Commonly known as: MUCINEX DM Take 1 tablet by mouth 2 (two) times daily.   doxycycline 100 MG tablet Commonly known as: VIBRA-TABS Take 1 tablet (100 mg total) by mouth every 12 (twelve) hours for 4 days.   folic acid 1 MG tablet Commonly known as: FOLVITE Take 1 mg by mouth daily.   GNP Vitamin C 1000 MG tablet Generic drug: ascorbic acid Take 1,000 mg by mouth daily.   levothyroxine 200 MCG tablet Commonly known as: SYNTHROID Take 1 tablet (200 mcg total) by mouth daily at 6 (six) AM.   loratadine 10 MG  tablet Commonly known as: CLARITIN Take 10 mg by mouth daily.   oxyCODONE-acetaminophen 10-325 MG tablet Commonly known as: PERCOCET Take 1 tablet by mouth 4 (four) times daily as needed for pain.   pantoprazole 40 MG tablet Commonly known as: PROTONIX Take 1 tablet (40 mg total) by mouth daily.   predniSONE 20 MG tablet Commonly known as: Deltasone 3 tablet by mouth daily x1 day; then 2 tablets by mouth daily x3 days; then 1 tablet  by mouth daily x3 days; then half tablet by mouth daily x3 days and stop prednisone.   Stiolto Respimat 2.5-2.5 MCG/ACT Aers Generic drug: Tiotropium Bromide-Olodaterol Inhale 2 puffs into the lungs daily.   Vitamin D (Ergocalciferol) 1.25 MG (50000 UNIT) Caps capsule Commonly known as: DRISDOL TAKE ONE CAPSULE BY MOUTH ONCE A WEEK   Vitamin D3 125 MCG (5000 UT) Caps Take 1 capsule (5,000 Units total) by mouth daily.   Xarelto 20 MG Tabs tablet Generic drug: rivaroxaban Take 20 mg by mouth daily.   Zinc 50 MG Tabs Take 1 tablet by mouth daily.      Allergies  Allergen Reactions  . Estrogens Hives  . Mustard [employer]  . Strawberry Extract Hives    The results of significant diagnostics from this hospitalization (including imaging, microbiology, ancillary and laboratory) are listed below for reference.    Significant Diagnostic Studies: DG Ankle Complete Right  Result Date: 08/08/2019 CLINICAL DATA:  Fall, syncope, lateral ankle bruising EXAM: RIGHT ANKLE - COMPLETE 3+ VIEW COMPARISON:  None FINDINGS: No acute bony abnormality. Specifically, no fracture, subluxation, or dislocation. Joint spaces maintained. Plantar calcaneal spur. IMPRESSION: No acute bony abnormality. Electronically Signed   By: Rolm Baptise M.D.   On: 08/08/2019 17:15   DG Chest Portable 1 View  Result Date: 08/08/2019 CLINICAL DATA:  COPD, shortness of breath EXAM: PORTABLE CHEST 1 VIEW COMPARISON:  06/13/2019 FINDINGS: Patchy bilateral lower lobe airspace opacities, left greater than right. Heart is upper limits normal in size. No effusions or pneumothorax. No acute bony abnormality. IMPRESSION: Patchy bilateral lower lobe opacities, left greater than right which could reflect atelectasis or pneumonia. Electronically Signed   By: Rolm Baptise M.D.   On: 08/08/2019 15:44    Microbiology: Recent Results (from the past 240 hour(s))  SARS Coronavirus 2 by RT PCR (hospital order, performed in [facility]  hospital lab) Nasopharyngeal Nasopharyngeal Swab     Status: None   Collection Time: 08/08/19  3:19 PM   Specimen: Nasopharyngeal Swab  Result Value Ref Range Status   SARS Coronavirus 2 NEGATIVE NEGATIVE Final    Comment: (NOTE) SARS-CoV-2 target nucleic acids are NOT DETECTED.  The SARS-CoV-2 RNA is generally detectable in upper and lower respiratory specimens during the acute phase of infection. The lowest concentration of SARS-CoV-2 viral copies this assay can detect is 250 copies / mL. A negative result does not preclude SARS-CoV-2 infection and should not be used as the sole basis for treatment or other patient management decisions.  A negative result may occur with improper specimen collection / handling, submission of specimen other than nasopharyngeal swab, presence of viral mutation(s) within the areas targeted by this assay, and inadequate number of viral copies (<250 copies / mL). A negative result must be combined with clinical observations, patient history, and epidemiological information.  Fact Sheet for Patients:   StrictlyIdeas.no  Fact Sheet for Healthcare Providers: BankingDealers.co.za  This test is not yet approved or  cleared by the [place] FDA and has  been authorized for detection and/or diagnosis of SARS-CoV-2 by FDA under an Emergency Use Authorization (EUA).  This EUA will remain in effect (meaning this test can be used) for the duration of the COVID-19 declaration under Section 564(b)(1) of the Act, 21 U.S.C. section 360bbb-3(b)(1), unless the authorization is terminated or revoked sooner.  Performed at [facility], [address] [place], [place] [address]   MRSA PCR Screening     Status: None   Collection Time: 08/09/19 10:44 AM   Specimen: Nasal Mucosa; Nasopharyngeal  Result Value Ref Range Status   MRSA by PCR NEGATIVE NEGATIVE Final    Comment:        The GeneXpert MRSA Assay (FDA approved  for NASAL specimens only), is one component of a comprehensive MRSA colonization surveillance program. It is not intended to diagnose MRSA infection nor to guide or monitor treatment for MRSA infections. Performed at [facility], [address] [place], [place] [address]      Labs: Basic Metabolic Panel: Recent Labs  Lab 08/08/19 1523 08/09/19 0603 08/10/19 0442  NA 141 142 138  K 4.4 5.4* 4.8  CL 92* 94* 91*  CO2 39* 37* 37*  GLUCOSE 124* 93 117*  BUN 9 13 21*  CREATININE 1.27* 0.97 0.82  CALCIUM 8.9 8.6* 8.8*  MG 2.0  --   --    CBC: Recent Labs  Lab 08/08/19 1523 08/09/19 0603  WBC 10.4 6.2  HGB 12.9 12.1  HCT 46.9* 42.9  MCV 117.5* 115.0*  PLT 209 173   BNP: BNP (last 3 results) Recent Labs    06/09/19 0935 06/11/19 2054 08/08/19 1524  BNP 444.0* 56.8 213.0*   CBG: Recent Labs  Lab 08/09/19 2335 08/10/19 0439 08/10/19 0749 08/10/19 1152 08/10/19 1605  GLUCAP 91 107* 116* 114* 136*   Signed:  Barton Dubois MD.  [facility] 08/10/2019, 4:09 PM

## 2019-08-10 NOTE — TOC Initial Note (Addendum)
Transition of Care [facility]) - Initial/Assessment Note    Patient Details  Name: Terri Casey MRN: [id number] Date of Birth: March 05, 1960  Transition of Care [facility]) CM/SW Contact:    Ihor Gully, LCSW Phone Number: 08/10/2019, 5:28 PM  Clinical Narrative:                 Patient from home with son and son's fiance. Admitted for AMS. Patient's oxygen is through [employer]. Patient states that oxygen nor CPAP (through [employer]) are working properly. DME specialist at [facility] advised that patient's CPAP was through [employer]. He indicated that he had spoken with patient multiple times regarding patient the need for patient to transfer her oxygen to [employer] because they only service [facility] and patient has moved to [facility] in February. Patient still has Lex[employer].  Spoke with Freda Munro at [employer]. Discussed patient's situation. Freda Munro advised that a transfer was possible because patient moved out of [place] service area. Discussed that patient would have to bring CPAP to [place] office for repair. Patient will have 4 etanks delivered to her hospital room today. She has 6 filled tanks at home. Patient will have a concentrator from [employer] delivered to her home today between 9:30-10:00 p.m.  Patient informed of details. Patient is agreeable to wait until etanks are delivered to room to discharge. Patient confirmed that [employer] could reach them at [phone] and was advised that someone would have to answer that number before they will deliver the oxygen. Patient agreeable.   Expected Discharge Plan: Home/Self Care Barriers to Discharge: Other (comment) (oxygen needs to be transferred from [facility] to [employer])   Patient Goals and CMS Choice Patient states their goals for this hospitalization and ongoing recovery are:: return home   Choice offered to / list presented to : Adult Children  Expected Discharge Plan and Services Expected Discharge Plan: Home/Self Care     Post Acute Care  Choice: Durable Medical Equipment Living arrangements for the past 2 months: Single Family Home Expected Discharge Date: 08/10/19                                    Prior Living Arrangements/Services Living arrangements for the past 2 months: Single Family Home Lives with:: Adult Children Patient language and need for interpreter reviewed:: Yes Do you feel safe going back to the place where you live?: Yes      Need for Family Participation in Patient Care: Yes (Comment) Care giver support system in place?: Yes (comment) Current home services: DME Criminal Activity/Legal Involvement Pertinent to Current Situation/Hospitalization: No - Comment as needed  Activities of Daily Living Home Assistive Devices/Equipment: None, Other (Comment) (family helps with movements) ADL Screening (condition at time of admission) Patient's cognitive ability adequate to safely complete daily activities?: Yes Is the patient deaf or have difficulty hearing?: No Does the patient have difficulty seeing, even when wearing glasses/contacts?: No Does the patient have difficulty concentrating, remembering, or making decisions?: No Patient able to express need for assistance with ADLs?: Yes Does the patient have difficulty dressing or bathing?: No Independently performs ADLs?: Yes (appropriate for developmental age) Does the patient have difficulty walking or climbing stairs?: No Weakness of Legs: Both Weakness of Arms/Hands: None  Permission Sought/Granted Permission sought to share information with : Family Supports Permission granted to share information with : Yes, Verbal Permission Granted  Share Information with NAME: [employer]  Permission granted to share info w Relationship: son's fiance     Emotional Assessment       Orientation: : Oriented to Self, Oriented to Place, Oriented to  Time, Oriented to Situation Alcohol / Substance Use: Not Applicable Psych Involvement: No  (comment)  Admission diagnosis:  Acute respiratory failure ([place]) [J96.00] Patient Active Problem List   Diagnosis Date Noted  . Acute on chronic respiratory failure with hypoxia and hypercapnia ([place]) 06/09/2019  . Obesity, Class III, BMI 40-49.9 (morbid obesity) ([place]) 06/09/2019  . Thrombocytopenia ([place]) 06/09/2019  . 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 Pericardial effusion: Per 2 d echo 03/26/2015 03/27/2015  . Acute on chronic respiratory failure ([place]) 03/26/2015  . PNA (pneumonia) 03/26/2015  . Nocturnal hypoxia 03/25/2015  . Acute respiratory failure with hypoxia ([place]) 03/25/2015  . COPD exacerbation ([place]) 03/25/2015  . COPD (chronic obstructive pulmonary disease) ([place]) 03/25/2015  . Anxiety   . Hypothyroidism 03/03/2015  . Vitamin D deficiency 03/03/2015  . Hyperlipidemia 07/08/2007  . CIGARETTE SMOKER 07/08/2007  . Essential hypertension, benign 07/08/2007  . EMPHYSEMA-on home O2 07/08/2007  . Asthma 07/08/2007  . C O P D 07/08/2007  . Sleep apnea 07/08/2007  . WEIGHT GAIN 07/08/2007   PCP:  Patient, No Pcp Per Pharmacy:   [facility], [place] [address] [place] [address] Phone: [phone] Fax: [phone]  [place], [place] - [address] [place] [address] Phone: [phone] Fax: [phone]     Social Determinants of Health (SDOH) Interventions    Readmission Risk Interventions No flowsheet data found.

## 2019-08-10 NOTE — Progress Notes (Signed)
Patient appears to be doing well she is awake and oriented. Placed on BiPAP for hs at 2300.

## 2019-08-10 NOTE — Progress Notes (Signed)
Patient discharged with oxygen tanks and supplies provided by home care agency. All belongings packed and transported with patient. Patient's daughter transporting with patient and assisting with belongings. Patient stable. Shows no s/s of distress or deterioration. Patient IV removed, catheter intact. Patient education provided and reiterated with daughter and patient. Patient transported to personal vehicle via wheelchair at 2010.

## 2019-08-10 NOTE — Progress Notes (Signed)
Patient's Bipap removed and 6L HFNC placed. Patient alert and oriented. No s/s of respiratory distress.

## 2019-08-10 NOTE — Progress Notes (Signed)
SATURATION QUALIFICATIONS: (This note is used to comply with regulatory documentation for home oxygen)  Patient Saturations on Room Air at Rest =  92%  Patient Saturations on Room Air while Ambulating = 80 %   Patient Saturations on 4Liters of oxygen while Ambulating 87%  Please briefly explain why patient needs home oxygen:Patient needs oxygen due to drop in saturation with activity.

## 2019-08-21 DIAGNOSIS — Z91148 Patient's other noncompliance with medication regimen for other reason: Secondary | ICD-10-CM | POA: Insufficient documentation

## 2019-08-21 DIAGNOSIS — R7989 Other specified abnormal findings of blood chemistry: Secondary | ICD-10-CM | POA: Insufficient documentation

## 2019-10-05 ENCOUNTER — Inpatient Hospital Stay (HOSPITAL_COMMUNITY)
Admission: EM | Admit: 2019-10-05 | Discharge: 2019-10-10 | DRG: 190 | Disposition: A | Discharge status: Home Health | Payer: Medicaid Other | Attending: Internal Medicine | Admitting: Internal Medicine

## 2019-10-05 ENCOUNTER — Emergency Department (HOSPITAL_COMMUNITY): Payer: Medicaid Other

## 2019-10-05 ENCOUNTER — Encounter (HOSPITAL_COMMUNITY): Payer: Self-pay | Admitting: *Deleted

## 2019-10-05 ENCOUNTER — Other Ambulatory Visit: Payer: Self-pay

## 2019-10-05 DIAGNOSIS — Z66 Do not resuscitate: Secondary | ICD-10-CM | POA: Diagnosis present

## 2019-10-05 DIAGNOSIS — J440 Chronic obstructive pulmonary disease with acute lower respiratory infection: Secondary | ICD-10-CM | POA: Diagnosis present

## 2019-10-05 DIAGNOSIS — J9622 Acute and chronic respiratory failure with hypercapnia: Secondary | ICD-10-CM | POA: Diagnosis present

## 2019-10-05 DIAGNOSIS — R296 Repeated falls: Secondary | ICD-10-CM | POA: Diagnosis present

## 2019-10-05 DIAGNOSIS — Z9181 History of falling: Secondary | ICD-10-CM

## 2019-10-05 DIAGNOSIS — D696 Thrombocytopenia, unspecified: Secondary | ICD-10-CM | POA: Diagnosis present

## 2019-10-05 DIAGNOSIS — F419 Anxiety disorder, unspecified: Secondary | ICD-10-CM | POA: Diagnosis present

## 2019-10-05 DIAGNOSIS — J9602 Acute respiratory failure with hypercapnia: Secondary | ICD-10-CM | POA: Diagnosis not present

## 2019-10-05 DIAGNOSIS — I959 Hypotension, unspecified: Secondary | ICD-10-CM | POA: Diagnosis present

## 2019-10-05 DIAGNOSIS — G8929 Other chronic pain: Secondary | ICD-10-CM | POA: Diagnosis present

## 2019-10-05 DIAGNOSIS — Z79890 Hormone replacement therapy (postmenopausal): Secondary | ICD-10-CM

## 2019-10-05 DIAGNOSIS — E039 Hypothyroidism, unspecified: Secondary | ICD-10-CM | POA: Diagnosis present

## 2019-10-05 DIAGNOSIS — M545 Low back pain, unspecified: Secondary | ICD-10-CM | POA: Diagnosis present

## 2019-10-05 DIAGNOSIS — K761 Chronic passive congestion of liver: Secondary | ICD-10-CM | POA: Diagnosis present

## 2019-10-05 DIAGNOSIS — I480 Paroxysmal atrial fibrillation: Secondary | ICD-10-CM | POA: Diagnosis present

## 2019-10-05 DIAGNOSIS — Z01818 Encounter for other preprocedural examination: Secondary | ICD-10-CM

## 2019-10-05 DIAGNOSIS — Z515 Encounter for palliative care: Secondary | ICD-10-CM

## 2019-10-05 DIAGNOSIS — J189 Pneumonia, unspecified organism: Secondary | ICD-10-CM | POA: Diagnosis present

## 2019-10-05 DIAGNOSIS — J9601 Acute respiratory failure with hypoxia: Secondary | ICD-10-CM | POA: Diagnosis present

## 2019-10-05 DIAGNOSIS — E119 Type 2 diabetes mellitus without complications: Secondary | ICD-10-CM | POA: Diagnosis present

## 2019-10-05 DIAGNOSIS — Z91018 Allergy to other foods: Secondary | ICD-10-CM

## 2019-10-05 DIAGNOSIS — G9341 Metabolic encephalopathy: Secondary | ICD-10-CM | POA: Diagnosis present

## 2019-10-05 DIAGNOSIS — Z6841 Body Mass Index (BMI) 40.0 and over, adult: Secondary | ICD-10-CM

## 2019-10-05 DIAGNOSIS — J9621 Acute and chronic respiratory failure with hypoxia: Secondary | ICD-10-CM | POA: Diagnosis present

## 2019-10-05 DIAGNOSIS — I5031 Acute diastolic (congestive) heart failure: Secondary | ICD-10-CM

## 2019-10-05 DIAGNOSIS — J45909 Unspecified asthma, uncomplicated: Secondary | ICD-10-CM | POA: Diagnosis present

## 2019-10-05 DIAGNOSIS — G4733 Obstructive sleep apnea (adult) (pediatric): Secondary | ICD-10-CM | POA: Diagnosis present

## 2019-10-05 DIAGNOSIS — E785 Hyperlipidemia, unspecified: Secondary | ICD-10-CM | POA: Diagnosis present

## 2019-10-05 DIAGNOSIS — J441 Chronic obstructive pulmonary disease with (acute) exacerbation: Secondary | ICD-10-CM | POA: Diagnosis present

## 2019-10-05 DIAGNOSIS — E559 Vitamin D deficiency, unspecified: Secondary | ICD-10-CM | POA: Diagnosis present

## 2019-10-05 DIAGNOSIS — Z9981 Dependence on supplemental oxygen: Secondary | ICD-10-CM | POA: Diagnosis not present

## 2019-10-05 DIAGNOSIS — I110 Hypertensive heart disease with heart failure: Secondary | ICD-10-CM | POA: Diagnosis present

## 2019-10-05 DIAGNOSIS — E860 Dehydration: Secondary | ICD-10-CM | POA: Diagnosis present

## 2019-10-05 DIAGNOSIS — Z23 Encounter for immunization: Secondary | ICD-10-CM

## 2019-10-05 DIAGNOSIS — Z20822 Contact with and (suspected) exposure to covid-19: Secondary | ICD-10-CM | POA: Diagnosis present

## 2019-10-05 DIAGNOSIS — F17210 Nicotine dependence, cigarettes, uncomplicated: Secondary | ICD-10-CM | POA: Diagnosis present

## 2019-10-05 DIAGNOSIS — Z888 Allergy status to other drugs, medicaments and biological substances status: Secondary | ICD-10-CM

## 2019-10-05 DIAGNOSIS — Z7902 Long term (current) use of antithrombotics/antiplatelets: Secondary | ICD-10-CM

## 2019-10-05 DIAGNOSIS — Z7189 Other specified counseling: Secondary | ICD-10-CM | POA: Diagnosis not present

## 2019-10-05 DIAGNOSIS — Z79891 Long term (current) use of opiate analgesic: Secondary | ICD-10-CM

## 2019-10-05 DIAGNOSIS — Z79899 Other long term (current) drug therapy: Secondary | ICD-10-CM

## 2019-10-05 LAB — CBC WITH DIFFERENTIAL/PLATELET
Abs Immature Granulocytes: 0.06 10*3/uL (ref 0.00–0.07)
Basophils Absolute: 0.1 10*3/uL (ref 0.0–0.1)
Basophils Relative: 1 %
Eosinophils Absolute: 0 10*3/uL (ref 0.0–0.5)
Eosinophils Relative: 0 %
HCT: 45.5 % (ref 36.0–46.0)
Hemoglobin: 12.3 g/dL (ref 12.0–15.0)
Immature Granulocytes: 1 %
Lymphocytes Relative: 4 %
Lymphs Abs: 0.4 10*3/uL — ABNORMAL LOW (ref 0.7–4.0)
MCH: 31.9 pg (ref 26.0–34.0)
MCHC: 27 g/dL — ABNORMAL LOW (ref 30.0–36.0)
MCV: 118.2 fL — ABNORMAL HIGH (ref 80.0–100.0)
Monocytes Absolute: 0.6 10*3/uL (ref 0.1–1.0)
Monocytes Relative: 6 %
Neutro Abs: 9.7 10*3/uL — ABNORMAL HIGH (ref 1.7–7.7)
Neutrophils Relative %: 88 %
Platelets: 192 10*3/uL (ref 150–400)
RBC: 3.85 MIL/uL — ABNORMAL LOW (ref 3.87–5.11)
RDW: 14.6 % (ref 11.5–15.5)
WBC Morphology: INCREASED
WBC: 10.8 10*3/uL — ABNORMAL HIGH (ref 4.0–10.5)
nRBC: 0 % (ref 0.0–0.2)

## 2019-10-05 LAB — RESPIRATORY PANEL BY RT PCR (FLU A&B, COVID)
Influenza A by PCR: NEGATIVE
Influenza B by PCR: NEGATIVE
SARS Coronavirus 2 by RT PCR: NEGATIVE

## 2019-10-05 LAB — BLOOD GAS, ARTERIAL
Acid-Base Excess: 15.2 mmol/L — ABNORMAL HIGH (ref 0.0–2.0)
Bicarbonate: 36.1 mmol/L — ABNORMAL HIGH (ref 20.0–28.0)
FIO2: 65
O2 Saturation: 98.1 %
Patient temperature: 36.4
pCO2 arterial: 96.4 mmHg (ref 32.0–48.0)
pH, Arterial: 7.265 — ABNORMAL LOW (ref 7.350–7.450)
pO2, Arterial: 96.4 mmHg (ref 83.0–108.0)

## 2019-10-05 LAB — COMPREHENSIVE METABOLIC PANEL
ALT: 11 U/L (ref 0–44)
AST: 17 U/L (ref 15–41)
Albumin: 3.4 g/dL — ABNORMAL LOW (ref 3.5–5.0)
Alkaline Phosphatase: 107 U/L (ref 38–126)
Anion gap: 11 (ref 5–15)
BUN: 15 mg/dL (ref 6–20)
CO2: 45 mmol/L — ABNORMAL HIGH (ref 22–32)
Calcium: 8.7 mg/dL — ABNORMAL LOW (ref 8.9–10.3)
Chloride: 86 mmol/L — ABNORMAL LOW (ref 98–111)
Creatinine, Ser: 1.14 mg/dL — ABNORMAL HIGH (ref 0.44–1.00)
GFR calc Af Amer: >60 mL/min (ref >60)
GFR calc non Af Amer: 52 mL/min — ABNORMAL LOW (ref >60)
Glucose, Bld: 115 mg/dL — ABNORMAL HIGH (ref 70–99)
Potassium: 4 mmol/L (ref 3.5–5.1)
Sodium: 142 mmol/L (ref 135–145)
Total Bilirubin: 0.6 mg/dL (ref 0.3–1.2)
Total Protein: 6.4 g/dL — ABNORMAL LOW (ref 6.5–8.1)

## 2019-10-05 LAB — PROTIME-INR
INR: 1.5 — ABNORMAL HIGH (ref 0.8–1.2)
Prothrombin Time: 17.9 seconds — ABNORMAL HIGH (ref 11.4–15.2)

## 2019-10-05 LAB — TROPONIN I (HIGH SENSITIVITY)
Troponin I (High Sensitivity): 113 ng/L (ref <18)
Troponin I (High Sensitivity): 129 ng/L (ref <18)

## 2019-10-05 LAB — GLUCOSE, CAPILLARY: Glucose-Capillary: 89 mg/dL (ref 70–99)

## 2019-10-05 LAB — LACTIC ACID, PLASMA
Lactic Acid, Venous: 1.3 mmol/L (ref 0.5–1.9)
Lactic Acid, Venous: 1.8 mmol/L (ref 0.5–1.9)

## 2019-10-05 LAB — AMMONIA: Ammonia: 33 umol/L (ref 9–35)

## 2019-10-05 LAB — BRAIN NATRIURETIC PEPTIDE: B Natriuretic Peptide: 310 pg/mL — ABNORMAL HIGH (ref 0.0–100.0)

## 2019-10-05 LAB — APTT: aPTT: 36 seconds (ref 24–36)

## 2019-10-05 MED ORDER — SODIUM CHLORIDE 0.9 % IV SOLN: INTRAVENOUS | Status: AC

## 2019-10-05 MED ORDER — SODIUM CHLORIDE 0.9 % IV BOLUS
2000.0000 mL | Freq: Once | INTRAVENOUS | Status: AC
  Administered 2019-10-05: 2000 mL via INTRAVENOUS

## 2019-10-05 MED ORDER — ACETAMINOPHEN 650 MG RE SUPP: 650.0000 mg | Freq: Four times a day (QID) | RECTAL | Status: DC | PRN

## 2019-10-05 MED ORDER — ONDANSETRON HCL 4 MG/2ML IJ SOLN: 4.0000 mg | Freq: Four times a day (QID) | INTRAMUSCULAR | Status: DC | PRN

## 2019-10-05 MED ORDER — INSULIN ASPART 100 UNIT/ML ~~LOC~~ SOLN
0.0000 [IU] | SUBCUTANEOUS | Status: DC
  Administered 2019-10-07 (×3): 1 [IU] via SUBCUTANEOUS

## 2019-10-05 MED ORDER — SODIUM CHLORIDE 0.9 % IV BOLUS
1000.0000 mL | Freq: Once | INTRAVENOUS | Status: AC
  Administered 2019-10-05: 1000 mL via INTRAVENOUS

## 2019-10-05 MED ORDER — ACETAMINOPHEN 325 MG PO TABS: 650.0000 mg | ORAL_TABLET | Freq: Four times a day (QID) | ORAL | Status: DC | PRN

## 2019-10-05 MED ORDER — SODIUM CHLORIDE 0.9 % IV SOLN
2.0000 g | Freq: Once | INTRAVENOUS | Status: AC
  Administered 2019-10-05: 2 g via INTRAVENOUS
  Filled 2019-10-05: qty 2

## 2019-10-05 MED ORDER — VANCOMYCIN HCL IN DEXTROSE 1-5 GM/200ML-% IV SOLN: 1000.0000 mg | Freq: Once | INTRAVENOUS | Status: DC

## 2019-10-05 MED ORDER — VANCOMYCIN HCL 2000 MG/400ML IV SOLN
2000.0000 mg | Freq: Once | INTRAVENOUS | Status: AC
  Administered 2019-10-05: 2000 mg via INTRAVENOUS
  Filled 2019-10-05: qty 400

## 2019-10-05 MED ORDER — IPRATROPIUM-ALBUTEROL 0.5-2.5 (3) MG/3ML IN SOLN
3.0000 mL | Freq: Four times a day (QID) | RESPIRATORY_TRACT | Status: AC
  Administered 2019-10-06 – 2019-10-07 (×5): 3 mL via RESPIRATORY_TRACT
  Filled 2019-10-05 (×5): qty 3

## 2019-10-05 MED ORDER — BUDESONIDE 0.5 MG/2ML IN SUSP: 0.5000 mg | Freq: Two times a day (BID) | RESPIRATORY_TRACT | Status: DC

## 2019-10-05 MED ORDER — GUAIFENESIN-DM 100-10 MG/5ML PO SYRP: 5.0000 mL | ORAL_SOLUTION | ORAL | Status: DC | PRN

## 2019-10-05 MED ORDER — ONDANSETRON HCL 4 MG PO TABS: 4.0000 mg | ORAL_TABLET | Freq: Four times a day (QID) | ORAL | Status: DC | PRN

## 2019-10-05 MED ORDER — MAGNESIUM SULFATE 2 GM/50ML IV SOLN
2.0000 g | Freq: Once | INTRAVENOUS | Status: AC
  Administered 2019-10-05: 2 g via INTRAVENOUS
  Filled 2019-10-05: qty 50

## 2019-10-05 MED ORDER — BUDESONIDE 0.5 MG/2ML IN SUSP
0.5000 mg | Freq: Two times a day (BID) | RESPIRATORY_TRACT | Status: DC
  Administered 2019-10-06 – 2019-10-10 (×9): 0.5 mg via RESPIRATORY_TRACT
  Filled 2019-10-05 (×10): qty 2

## 2019-10-05 MED ORDER — RIVAROXABAN 20 MG PO TABS
20.0000 mg | ORAL_TABLET | Freq: Every day | ORAL | Status: DC
  Administered 2019-10-06 – 2019-10-10 (×5): 20 mg via ORAL
  Filled 2019-10-05 (×5): qty 1

## 2019-10-05 MED ORDER — IPRATROPIUM-ALBUTEROL 0.5-2.5 (3) MG/3ML IN SOLN: 3.0000 mL | RESPIRATORY_TRACT | Status: DC | PRN

## 2019-10-05 MED ORDER — METHYLPREDNISOLONE SODIUM SUCC 125 MG IJ SOLR
60.0000 mg | Freq: Two times a day (BID) | INTRAMUSCULAR | Status: DC
  Administered 2019-10-06 – 2019-10-08 (×5): 60 mg via INTRAVENOUS
  Filled 2019-10-05 (×5): qty 2

## 2019-10-05 MED ORDER — METHYLPREDNISOLONE SODIUM SUCC 125 MG IJ SOLR
125.0000 mg | Freq: Once | INTRAMUSCULAR | Status: AC
  Administered 2019-10-05: 125 mg via INTRAVENOUS
  Filled 2019-10-05: qty 2

## 2019-10-05 MED ORDER — VANCOMYCIN HCL IN DEXTROSE 1-5 GM/200ML-% IV SOLN: 1000.0000 mg | Freq: Two times a day (BID) | INTRAVENOUS | Status: DC

## 2019-10-05 MED ORDER — POLYETHYLENE GLYCOL 3350 17 G PO PACK: 17.0000 g | PACK | Freq: Every day | ORAL | Status: DC | PRN

## 2019-10-05 MED ORDER — LEVOTHYROXINE SODIUM 100 MCG PO TABS
200.0000 ug | ORAL_TABLET | Freq: Every day | ORAL | Status: DC
  Administered 2019-10-06 – 2019-10-10 (×5): 200 ug via ORAL
  Filled 2019-10-05 (×5): qty 2

## 2019-10-05 MED ORDER — SODIUM CHLORIDE 0.9 % IV SOLN
2.0000 g | Freq: Three times a day (TID) | INTRAVENOUS | Status: DC
  Administered 2019-10-05 – 2019-10-08 (×8): 2 g via INTRAVENOUS
  Filled 2019-10-05 (×8): qty 2

## 2019-10-05 NOTE — Progress Notes (Signed)
Pharmacy Antibiotic Note  Terri Casey is a 59 y.o. female admitted on 10/05/2019 with pneumonia.  Pharmacy has been consulted for Vancomycin and cefepime dosing.  Plan: Vancomycin 2000mg  loading dose, then 1000mg  IV every 12 hours.  Goal trough 15-20 mcg/mL. AUC is 410 Cefepime 2gm IV q8h F/U cxs and clinical progress Monitor V/S, labs and levels as indicated  Height: 5\' 9"  (175.3 cm) Weight: 129.6 kg (285 lb 11.5 oz) IBW/kg (Calculated) : 66.2  Temp (24hrs), Avg:97 F (36.1 C), Min:96.4 F (35.8 C), Max:97.6 F (36.4 C)  Recent Labs  Lab 10/05/19 1218 10/05/19 1423  WBC 10.8*  --   CREATININE 1.14*  --   LATICACIDVEN 1.3 1.8    Estimated Creatinine Clearance: 75.9 mL/min (A) (by C-G formula based on SCr of 1.14 mg/dL (H)).    Allergies  Allergen Reactions  . Estrogens Hives  . Mustard [employer]  . Strawberry Extract Hives    Antimicrobials this admission: Vancomycin 10/4 >> Cefepime 10/4 >>   Microbiology results: 10/4 BCx: Pending 10/4 UCx: pending  MRSA PCR:   Thank you for allowing pharmacy to be a part of this patient's care.  Isac Sarna, BS [place], [place] Clinical Pharmacist Pager [phone] 10/05/2019 3:40 PM

## 2019-10-05 NOTE — ED Notes (Signed)
Pt in bed with eyes closed, pt arouses to loud verbal stim.  Pt has bipap in place, pt remains on the monitor.

## 2019-10-05 NOTE — ED Notes (Signed)
Pt hypotensive, md notified, 2L ns bolus given.

## 2019-10-05 NOTE — ED Notes (Signed)
Pt in bed with eyes closed, pt arouses to verbal stim, pt states that she is feeling better.

## 2019-10-05 NOTE — ED Notes (Signed)
Date and time results received: 10/05/19 1425 (use smartphrase ".now" to insert current time)  Test: Trop Critical Value: 113  Name of Provider Notified: [place]

## 2019-10-05 NOTE — ED Notes (Signed)
RT called to place pt on bipap per Dr. Ellsworth Lennox verbal order.

## 2019-10-05 NOTE — H&P (Signed)
History and Physical    Terri Casey QQV:[id number] DOB: 1960/08/21 DOA: 10/05/2019  PCP: Patient, No Pcp Per   Patient coming from: Home  I have personally briefly reviewed patient's old medical records in [place]  Chief Complaint: Unresponsiveness  HPI: Terri Casey is a 59 y.o. female with medical history significant for COPD, asthma with chronic respiratory failure on 6 L of oxygen, OSA, DM, hypertension. Patient was brought to the ED via EMS due to unresponsiveness.  Per transfer notes, on EMS arrival, patient's O2 sats was 52% on 6 L of oxygen, with nonrebreather patient's O2 sats increased to 90%.  On arrival to the ED patient was responsive only to painful stimuli.  At the time of my evaluation, patient is still on BiPAP, she is awake, able to talk answering questions  due to BiPAP history of same.  She still smokes cigarettes.  She has not been using her CPAP at night.  She tells me she has been compliant with her medications.  She also has not been eating well, and her breathing has been bad for the past few days.  She also reports she has been falling a lot.  Hospitalizations in the recent past for same.  Last hospitalization 8/7- 8/9 also with hypercarbic and hypoxic respiratory failure, PCO2 up to 107, patient required BiPAP.  She has required intubations also in the past.  ED Course: O2 sats 96% on nonrebreather, temperature down to 96.4, blood pressure systolic initially 332 dropped to 88/49.  WBC 10.8.  BNP 310.  Lactic acid 1.3.  Arterial blood gas showed pH of 7.2, PCO2 elevated at 96.4.  PO2 of 96.   Port Chest x-ray-left lower lobe opacity suspicious for pneumonia, suspect small left pleural effusion.  Patient was placed on BiPAP, 3 L bolus normal saline given with improvement in blood pressure, patient was started on broad-spectrum antibiotics IV vancomycin and cefepime.  Hospitalist admit for acute hypoxic and hypercarbic respiratory failure.  Review of  Systems: As per HPI all other systems reviewed and negative.  Past Medical History:  Diagnosis Date  . Anxiety   . Asthma   . CHF (congestive heart failure) ([place])   . COPD (chronic obstructive pulmonary disease) ([place])   . Diabetes mellitus without complication ([place])   . Hypertension   . Thyroid disease     Past Surgical History:  Procedure Laterality Date  . ABDOMINAL HYSTERECTOMY       reports that she has been smoking. She has been smoking about 0.50 packs per day. She has never used smokeless tobacco. She reports that she does not drink alcohol and does not use drugs.  Allergies  Allergen Reactions  . Estrogens Hives  . Mustard [employer]  . Strawberry Extract Hives   Family history-limited, patient on BiPAP.  Prior to Admission medications   Medication Sig Start Date End Date-  Taking? Authorizing Provider  amLODipine (NORVASC) 10 MG tablet Take 1 tablet (10 mg total) by mouth daily. 06/20/19  Yes Swayze, Ava, DO  atorvastatin (LIPITOR) 20 MG tablet Take 20 mg by mouth at bedtime.    Yes [provider]  bethanechol (URECHOLINE) 5 MG tablet Take 5 mg by mouth 2 (two) times daily. 08/06/19  Yes [provider]  cyclobenzaprine (FLEXERIL) 5 MG tablet Take 5 mg by mouth 3 (three) times daily. 02/23/19  Yes [provider]  levothyroxine (SYNTHROID) 200 MCG tablet Take 1 tablet (200 mcg total) by mouth daily at 6 (six)  AM. 06/20/19  Yes Swayze, Ava, DO  loratadine (CLARITIN) 10 MG tablet Take 10 mg by mouth daily.   Yes [provider]  oxyCODONE-acetaminophen (PERCOCET) 10-325 MG tablet Take 1 tablet by mouth 4 (four) times daily as needed for pain. 07/24/19  Yes [provider]  pantoprazole (PROTONIX) 40 MG tablet Take 1 tablet (40 mg total) by mouth daily. 06/20/19  Yes Swayze, Ava, DO  XARELTO 20 MG TABS tablet Take 20 mg by mouth daily. 05/08/19  Yes [provider]  budesonide (PULMICORT) 0.5 MG/2ML nebulizer solution Take 2  mLs (0.5 mg total) by nebulization 2 (two) times daily. Patient not taking: Reported on 10/05/2019 06/19/19   Swayze, Ava, DO  Cholecalciferol (VITAMIN D3) 5000 units CAPS Take 1 capsule (5,000 Units total) by mouth daily. Patient not taking: Reported on 10/05/2019 02/14/16   Cassandria Anger, MD  dextromethorphan-guaiFENesin [facility] DM) 30-600 MG 12hr tablet Take 1 tablet by mouth 2 (two) times daily. Patient not taking: Reported on 10/05/2019 08/10/19   Barton Dubois, MD  ipratropium-albuterol (DUONEB) 0.5-2.5 (3) MG/3ML SOLN Take 3 mLs by nebulization every 2 (two) hours as needed. Use 3 times daily x 4 days, then every 2 hours as needed. Patient not taking: Reported on 10/05/2019 03/29/15   Eugenie Filler, MD  predniSONE (DELTASONE) 20 MG tablet 3 tablet by mouth daily x1 day; then 2 tablets by mouth daily x3 days; then 1 tablet by mouth daily x3 days; then half tablet by mouth daily x3 days and stop prednisone. Patient not taking: Reported on 10/05/2019 08/10/19   Barton Dubois, MD  Vitamin D, Ergocalciferol, (DRISDOL) 50000 units CAPS capsule TAKE ONE CAPSULE BY MOUTH ONCE A WEEK 10/01/16   Cassandria Anger, MD    Physical Exam: Vitals:   10/05/19 1345 10/05/19 1430 10/05/19 1445 10/05/19 1450  BP: (!) 89/52 95/75 106/82   Pulse: 77 79 78   Resp: 12 15 16    Temp:    (!) 96.4 F (35.8 C)  TempSrc:    Rectal  SpO2: 95% 93% 97%   Weight:      Height:        Constitutional: On BiPAP, calm, comfortable Vitals:   10/05/19 1345 10/05/19 1430 10/05/19 1445 10/05/19 1450  BP: (!) 89/52 95/75 106/82   Pulse: 77 79 78   Resp: 12 15 16    Temp:    (!) 96.4 F (35.8 C)  TempSrc:    Rectal  SpO2: 95% 93% 97%   Weight:      Height:       Eyes: PERRL, lids and conjunctivae normal ENMT: On BiPAP Neck: normal, no masses, no thyromegaly Respiratory: On BiPAP, equal air entry.   Cardiovascular: Regular rate and rhythm, no murmurs / rubs / gallops. No extremity edema. 2+ pedal pulses.    Abdomen: no tenderness, no masses palpated. No hepatosplenomegaly. Bowel sounds positive.  Musculoskeletal: no clubbing / cyanosis. No joint deformity upper and lower extremities. Good ROM, no contractures. Normal muscle tone.  Skin: no rashes, lesions, ulcers. No induration Neurologic: On BiPAP hence limited, no apparent cranial nerve abnormality, moving extremities spontaneously, pain to left lower leg with movement, due to recent frequent falls. Psychiatric: Normal judgment and insight. Alert and oriented x 3. Normal mood.   Labs on Admission: I have personally reviewed following labs and imaging studies  CBC: Recent Labs  Lab 10/05/19 1218  WBC 10.8*  NEUTROABS 9.7*  HGB 12.3  HCT 45.5  MCV 118.2*  PLT  956   Basic Metabolic Panel: Recent Labs  Lab 10/05/19 1218  NA 142  K 4.0  CL 86*  CO2 45*  GLUCOSE 115*  BUN 15  CREATININE 1.14*  CALCIUM 8.7*   Liver Function Tests: Recent Labs  Lab 10/05/19 1218  AST 17  ALT 11  ALKPHOS 107  BILITOT 0.6  PROT 6.4*  ALBUMIN 3.4*    Radiological Exams on Admission: DG Chest Port 1 View  Result Date: 10/05/2019 CLINICAL DATA:  Unresponsive.  Hypoxia.  Possible sepsis. EXAM: PORTABLE CHEST 1 VIEW COMPARISON:  Chest x-ray dated August 20, 2019. FINDINGS: Stable cardiomegaly. Chronically coarsened interstitial markings, likely smoking-related. New retrocardiac left lower lobe opacity with suspected small left pleural effusion. The right lung is clear. No pneumothorax. No acute osseous abnormality. IMPRESSION: 1. Left lower lobe opacity suspicious for pneumonia given clinical history. Suspected small left pleural effusion. Electronically Signed   By: Titus Dubin M.D.   On: 10/05/2019 12:53    EKG: Independently reviewed.  Sinus rhythm, QTC 4665.  Rate 97.  Assessment/Plan Principal Problem:   Acute respiratory failure with hypoxia and hypercapnia (HCC) Active Problems:   Asthma   Hypothyroidism   COPD exacerbation  (HCC)   Obesity, Class III, BMI 40-49.9 (morbid obesity) (HCC)   AF (paroxysmal atrial fibrillation) (HCC)   Acute on chronic hypoxic and hypercarbic respiratory failure-  currently on BiPAP, mental status has improved. ABG shows PCO2- 96.4, PH- 7.23, O2 of 96,  serum bicarb 45 -reflecting chronic PCO2 retention. O2 sats down to 52% on 6 L.  Has required intubation for same in recent past, also history of Pseudomonas pneumonia.   Likely combination of COPD exacerbation provoked by possible pneumonia and sleep apnea.   -Continue Continue BIPAP - Repeat ABG  - Continue IV cefepime for PNA and COPD exacerbation  - BMP, CBC in A.m -Remain n.p.o. -Follow-up Covid test  Metabolic encephalopathy, falls - from marked hypercarbia- Pco2- 96. Mental status has improved with BIPAP. - Cont BIPAP  -May need PT eval prior to discharge  Pneumonia/COPD Exacerbation- with respiratory failure. On BIPAP.  Port chest today with left lower lobe opacity suspicious for pneumonia. WBC- 10.8.  Lactic acid 1.3. Rules out for sepsis.  Hypotension likely from dehydration, improved with IV fluid bolus. - IV cefepime - Duonebs sch and PRN - Resume home inhalers - Budesonide nebs - Mucolytics - IV solumedrol 60 q12h  Atria Fibrillation - Rate controlled, and on chronic anticoag. Not on rate limiting drugs. - Resume xarelto  HTN-became hypotensive, systolic down to 38V, likely from poor oral intake and anti-hypertensives - Improved with 3 L bolus given, continue N/s 75cc/hr x 15hrs. - Hold home Norvasc  Controlled DM- random glucose 115. Recent Hgba1c 5.1. Not on medications - SSI - s q6h while on steroids.  Hypothyrodism - resume Synthroid  DVT prophylaxis: Xarelto Code Status: Full code Family Communication: None at bedside. Disposition Plan: > 2 days, pending improvement in respiratory status. Consults called: none Admission status: Inpatient, telemetry I certify that at the point of admission it  is my clinical judgment that the patient will require inpatient hospital care spanning beyond 2 midnights from the point of admission due to high intensity of service, high risk for further deterioration and high frequency of surveillance required. The following factors support the patient status of inpatient:    Bethena Roys MD [facility]  10/05/2019, 3:23 PM

## 2019-10-05 NOTE — Progress Notes (Signed)
Pt on the floor from ED. She has received a total of 3L fluid with no output. Bladder scan showed greater than 725 mL urine. This RN as well as Doctor, hospital performed in/out cath using sterile technique. We emptied 800 mL urine. New pure wick in place. Will continue to monitor patient.

## 2019-10-05 NOTE — ED Triage Notes (Addendum)
Pt brought in by [facility] from home due to unresponsiveness. Upon EMS arrival, pt's O2 sat was 52% on 6L O2 via [place]. O2 sat increased to 90% with NRB per EMS. Pt responsive to painful stimuli upon arrival to ED. Hx end stage COPD.

## 2019-10-05 NOTE — ED Provider Notes (Signed)
[facility] EMERGENCY DEPARTMENT Provider Note   CSN: [id number] Arrival date & time: 10/05/19  1203     History Chief Complaint  Patient presents with  . Unresponsive    Terri Casey is a 59 y.o. female.  Patient comes in with confusion.  She was found unresponsive by paramedics with O2 sat in the 50s.  Patient has COPD and is on 6 L at home  The history is provided by medical records and the EMS personnel. No language interpreter was used.  Altered Mental Status Presenting symptoms: behavior changes   Severity:  Severe Most recent episode:  Today Episode history:  Continuous Timing:  Constant Progression:  Worsening Chronicity:  New Context: alcohol use   Associated symptoms: no abdominal pain        Past Medical History:  Diagnosis Date  . Anxiety   . Asthma   . CHF (congestive heart failure) ([place])   . COPD (chronic obstructive pulmonary disease) ([place])   . Diabetes mellitus without complication ([place])   . Hypertension   . Thyroid disease     Patient Active Problem List   Diagnosis Date Noted  . Acute on chronic respiratory failure with hypoxia and hypercapnia ([place]) 06/09/2019  . Obesity, Class III, BMI 40-49.9 (morbid obesity) ([place]) 06/09/2019  . Thrombocytopenia ([place]) 06/09/2019  . 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 Pericardial effusion: Per 2 d echo 03/26/2015 03/27/2015  . Acute on chronic respiratory failure ([place]) 03/26/2015  . PNA (pneumonia) 03/26/2015  . Nocturnal hypoxia 03/25/2015  . Acute respiratory failure with hypoxia ([place]) 03/25/2015  . COPD exacerbation ([place]) 03/25/2015  . COPD (chronic obstructive pulmonary disease) ([place]) 03/25/2015  . Anxiety   . Hypothyroidism 03/03/2015  . Vitamin D deficiency 03/03/2015  . Hyperlipidemia 07/08/2007  . CIGARETTE SMOKER 07/08/2007  . Essential hypertension, benign 07/08/2007  . EMPHYSEMA-on home O2 07/08/2007  . Asthma 07/08/2007  . C O P D 07/08/2007  . Sleep apnea 07/08/2007  .  WEIGHT GAIN 07/08/2007    Past Surgical History:  Procedure Laterality Date  . ABDOMINAL HYSTERECTOMY       OB History   No obstetric history on file.     No family history on file.  Social History   Tobacco Use  . Smoking status: Current Every Day Smoker    Packs/day: 0.50  . Smokeless tobacco: Never Used  Substance Use Topics  . Alcohol use: No  . Drug use: No    Home Medications Prior to Admission medications   Medication Sig Start Date End Date Taking? Authorizing Provider  amLODipine (NORVASC) 10 MG tablet Take 1 tablet (10 mg total) by mouth daily. 06/20/19  Yes Swayze, Ava, DO  atorvastatin (LIPITOR) 20 MG tablet Take 20 mg by mouth at bedtime.    Yes [provider]  bethanechol (URECHOLINE) 5 MG tablet Take 5 mg by mouth 2 (two) times daily. 08/06/19  Yes [provider]  cyclobenzaprine (FLEXERIL) 5 MG tablet Take 5 mg by mouth 3 (three) times daily. 02/23/19  Yes [provider]  levothyroxine (SYNTHROID) 200 MCG tablet Take 1 tablet (200 mcg total) by mouth daily at 6 (six) AM. 06/20/19  Yes Swayze, Ava, DO  loratadine (CLARITIN) 10 MG tablet Take 10 mg by mouth daily.   Yes [provider]  oxyCODONE-acetaminophen (PERCOCET) 10-325 MG tablet Take 1 tablet by mouth 4 (four) times daily as needed for pain. 07/24/19  Yes [provider]  pantoprazole (PROTONIX) 40 MG tablet Take 1 tablet (  40 mg total) by mouth daily. 06/20/19  Yes Swayze, Ava, DO  XARELTO 20 MG TABS tablet Take 20 mg by mouth daily. 05/08/19  Yes [provider]  budesonide (PULMICORT) 0.5 MG/2ML nebulizer solution Take 2 mLs (0.5 mg total) by nebulization 2 (two) times daily. Patient not taking: Reported on 10/05/2019 06/19/19   Swayze, Ava, DO  Cholecalciferol (VITAMIN D3) 5000 units CAPS Take 1 capsule (5,000 Units total) by mouth daily. Patient not taking: Reported on 10/05/2019 02/14/16   Cassandria Anger, MD  dextromethorphan-guaiFENesin [facility]  DM) 30-600 MG 12hr tablet Take 1 tablet by mouth 2 (two) times daily. Patient not taking: Reported on 10/05/2019 08/10/19   Barton Dubois, MD  ipratropium-albuterol (DUONEB) 0.5-2.5 (3) MG/3ML SOLN Take 3 mLs by nebulization every 2 (two) hours as needed. Use 3 times daily x 4 days, then every 2 hours as needed. Patient not taking: Reported on 10/05/2019 03/29/15   Eugenie Filler, MD  predniSONE (DELTASONE) 20 MG tablet 3 tablet by mouth daily x1 day; then 2 tablets by mouth daily x3 days; then 1 tablet by mouth daily x3 days; then half tablet by mouth daily x3 days and stop prednisone. Patient not taking: Reported on 10/05/2019 08/10/19   Barton Dubois, MD  Vitamin D, Ergocalciferol, (DRISDOL) 50000 units CAPS capsule TAKE ONE CAPSULE BY MOUTH ONCE A WEEK 10/01/16   Cassandria Anger, MD    Allergies    Estrogens, Mustard seed, and Strawberry extract  Review of Systems   Review of Systems  Unable to perform ROS: Mental status change  Gastrointestinal: Negative for abdominal pain.    Physical Exam Updated Vital Signs BP 106/82   Pulse 78   Temp (!) 96.4 F (35.8 C) (Rectal)   Resp 16   Ht 5\' 9"  (1.753 m)   Wt 129.6 kg   SpO2 97%   BMI 42.19 kg/m   Physical Exam Vitals reviewed.  Constitutional:      Appearance: She is well-developed.     Comments: Lethargic  HENT:     Head: Normocephalic.     Right Ear: Tympanic membrane normal.  Eyes:     General: No scleral icterus.    Conjunctiva/sclera: Conjunctivae normal.  Neck:     Thyroid: No thyromegaly.  Cardiovascular:     Rate and Rhythm: Normal rate and regular rhythm.     Heart sounds: No murmur heard.  No friction rub. No gallop.   Pulmonary:     Breath sounds: No stridor. No wheezing or rales.  Chest:     Chest wall: No tenderness.  Abdominal:     General: There is no distension.     Tenderness: There is no abdominal tenderness. There is no rebound.  Musculoskeletal:        General: Normal range of motion.      Cervical back: Neck supple.  Lymphadenopathy:     Cervical: No cervical adenopathy.  Skin:    Findings: No erythema or rash.  Neurological:     Motor: No abnormal muscle tone.     Coordination: Coordination normal.     Comments: Patient responding to painful stimuli only with movement     ED Results / Procedures / Treatments   Labs (all labs ordered are listed, but only abnormal results are displayed) Labs Reviewed  COMPREHENSIVE METABOLIC PANEL - Abnormal; Notable for the following components:      Result Value   Chloride 86 (*)    CO2 45 (*)  Glucose, Bld 115 (*)    Creatinine, Ser 1.14 (*)    Calcium 8.7 (*)    Total Protein 6.4 (*)    Albumin 3.4 (*)    GFR calc non Af Amer 52 (*)    All other components within normal limits  CBC WITH DIFFERENTIAL/PLATELET - Abnormal; Notable for the following components:   WBC 10.8 (*)    RBC 3.85 (*)    MCV 118.2 (*)    MCHC 27.0 (*)    Neutro Abs 9.7 (*)    Lymphs Abs 0.4 (*)    All other components within normal limits  BRAIN NATRIURETIC PEPTIDE - Abnormal; Notable for the following components:   B Natriuretic Peptide 310.0 (*)    All other components within normal limits  BLOOD GAS, ARTERIAL - Abnormal; Notable for the following components:   pH, Arterial 7.265 (*)    pCO2 arterial 96.4 (*)    Bicarbonate 36.1 (*)    Acid-Base Excess 15.2 (*)    All other components within normal limits  TROPONIN I (HIGH SENSITIVITY) - Abnormal; Notable for the following components:   Troponin I (High Sensitivity) 113 (*)    All other components within normal limits  CULTURE, BLOOD (ROUTINE X 2)  CULTURE, BLOOD (ROUTINE X 2)  URINE CULTURE  RESPIRATORY PANEL BY RT PCR (FLU A&B, COVID)  LACTIC ACID, PLASMA  LACTIC ACID, PLASMA  URINALYSIS, ROUTINE W REFLEX MICROSCOPIC  AMMONIA  PROTIME-INR  APTT  POC URINE PREG, ED  TROPONIN I (HIGH SENSITIVITY)    EKG None  Radiology DG Chest Port 1 View  Result Date: 10/05/2019 CLINICAL  DATA:  Unresponsive.  Hypoxia.  Possible sepsis. EXAM: PORTABLE CHEST 1 VIEW COMPARISON:  Chest x-ray dated August 20, 2019. FINDINGS: Stable cardiomegaly. Chronically coarsened interstitial markings, likely smoking-related. New retrocardiac left lower lobe opacity with suspected small left pleural effusion. The right lung is clear. No pneumothorax. No acute osseous abnormality. IMPRESSION: 1. Left lower lobe opacity suspicious for pneumonia given clinical history. Suspected small left pleural effusion. Electronically Signed   By: Titus Dubin M.D.   On: 10/05/2019 12:53    Procedures Procedures (including critical care time)  Medications Ordered in ED Medications  magnesium sulfate IVPB 2 g 50 mL (2 g Intravenous New Bag/Given 10/05/19 1446)  ceFEPIme (MAXIPIME) 2 g in sodium chloride 0.9 % 100 mL IVPB (0 g Intravenous Stopped 10/05/19 1314)  vancomycin (VANCOREADY) IVPB 2000 mg/400 mL ( Intravenous Stopped 10/05/19 1444)  sodium chloride 0.9 % bolus 2,000 mL (0 mLs Intravenous Stopped 10/05/19 1434)  methylPREDNISolone sodium succinate (SOLU-MEDROL) 125 mg/2 mL injection 125 mg (125 mg Intravenous Given 10/05/19 1439)  sodium chloride 0.9 % bolus 1,000 mL (1,000 mLs Intravenous New Bag/Given 10/05/19 1441)    ED Course  I have reviewed the triage vital signs and the nursing notes.  Pertinent labs & imaging results that were available during my care of the patient were reviewed by me and considered in my medical decision making (see chart for details).    CRITICAL CARE Performed by: Milton Ferguson Total critical care time: 45 minutes Critical care time was exclusive of separately billable procedures and treating other patients. Critical care was necessary to treat or prevent imminent or life-threatening deterioration. Critical care was time spent personally by me on the following activities: development of treatment plan with patient and/or surrogate as well as nursing, discussions with  consultants, evaluation of patient's response to treatment, examination of patient, obtaining history from patient or  surrogate, ordering and performing treatments and interventions, ordering and review of laboratory studies, ordering and review of radiographic studies, pulse oximetry and re-evaluation of patient's condition.    Patient with respiratory failure and pneumonia.  Patient responded well to BiPAP and has been treated for sepsis. MDM Rules/Calculators/A&P                          Patient with sepsis and respiratory failure she will be admitted to medicine       This patient presents to the ED for concern of altered mental status, this involves an extensive number of treatment options, and is a complaint that carries with it a high risk of complications and morbidity.  The differential diagnosis includes pneumonia CO2 retention.  Exacerbation of COPD   Lab Tests:  I Ordered, reviewed, and interpreted labs, which included CBC and chemistries which showed anemia and mild elevation white count with left shift Medicines ordered:   I ordered medication antibiotics for sepsis along with BiPAP  Imaging Studies ordered:   I ordered imaging studies which included chest x-ray  I independently visualized and interpreted idmaging which showed pneumonia  Additional history obtained:   Additional history obtained from EMS and records  Previous records obtained and reviewed.  Consultations Obtained:   I consulted hospitalist and discussed lab and imaging findings  Reevaluation:  After the interventions stated above, I reevaluated the patient and found improved  Critical Interventions:  .   Final Clinical Impression(s) / ED Diagnoses Final diagnoses:  None    Rx / DC Orders ED Discharge Orders    None       Milton Ferguson, MD 10/05/19 1532

## 2019-10-06 ENCOUNTER — Other Ambulatory Visit: Payer: Self-pay

## 2019-10-06 DIAGNOSIS — E039 Hypothyroidism, unspecified: Secondary | ICD-10-CM

## 2019-10-06 DIAGNOSIS — J441 Chronic obstructive pulmonary disease with (acute) exacerbation: Principal | ICD-10-CM

## 2019-10-06 DIAGNOSIS — J9602 Acute respiratory failure with hypercapnia: Secondary | ICD-10-CM | POA: Diagnosis not present

## 2019-10-06 DIAGNOSIS — J9601 Acute respiratory failure with hypoxia: Secondary | ICD-10-CM | POA: Diagnosis not present

## 2019-10-06 DIAGNOSIS — I480 Paroxysmal atrial fibrillation: Secondary | ICD-10-CM

## 2019-10-06 LAB — URINALYSIS, ROUTINE W REFLEX MICROSCOPIC
Bilirubin Urine: NEGATIVE
Glucose, UA: NEGATIVE mg/dL
Hgb urine dipstick: NEGATIVE
Ketones, ur: NEGATIVE mg/dL
Leukocytes,Ua: NEGATIVE
Nitrite: NEGATIVE
Protein, ur: 30 mg/dL — AB
Specific Gravity, Urine: 1.017 (ref 1.005–1.030)
pH: 5 (ref 5.0–8.0)

## 2019-10-06 LAB — GLUCOSE, CAPILLARY
Glucose-Capillary: 97 mg/dL (ref 70–99)
Glucose-Capillary: 87 mg/dL (ref 70–99)
Glucose-Capillary: 138 mg/dL — ABNORMAL HIGH (ref 70–99)
Glucose-Capillary: 112 mg/dL — ABNORMAL HIGH (ref 70–99)

## 2019-10-06 LAB — BLOOD GAS, ARTERIAL
Acid-Base Excess: 16.9 mmol/L — ABNORMAL HIGH (ref 0.0–2.0)
Acid-Base Excess: 11.8 mmol/L — ABNORMAL HIGH (ref 0.0–2.0)
Bicarbonate: 38.4 mmol/L — ABNORMAL HIGH (ref 20.0–28.0)
Bicarbonate: 35.3 mmol/L — ABNORMAL HIGH (ref 20.0–28.0)
FIO2: 50
FIO2: 44
O2 Saturation: 95.7 %
O2 Saturation: 97.8 %
Patient temperature: 35.8
Patient temperature: 37
pCO2 arterial: 80.1 mmHg (ref 32.0–48.0)
pCO2 arterial: 43 mmHg (ref 32.0–48.0)
pH, Arterial: 7.347 — ABNORMAL LOW (ref 7.350–7.450)
pH, Arterial: 7.527 — ABNORMAL HIGH (ref 7.350–7.450)
pO2, Arterial: 73 mmHg — ABNORMAL LOW (ref 83.0–108.0)
pO2, Arterial: 79.9 mmHg — ABNORMAL LOW (ref 83.0–108.0)

## 2019-10-06 LAB — MRSA PCR SCREENING: MRSA by PCR: NEGATIVE

## 2019-10-06 LAB — CBC
HCT: 41 % (ref 36.0–46.0)
Hemoglobin: 11.6 g/dL — ABNORMAL LOW (ref 12.0–15.0)
MCH: 31.9 pg (ref 26.0–34.0)
MCHC: 28.3 g/dL — ABNORMAL LOW (ref 30.0–36.0)
MCV: 112.6 fL — ABNORMAL HIGH (ref 80.0–100.0)
Platelets: 154 10*3/uL (ref 150–400)
RBC: 3.64 MIL/uL — ABNORMAL LOW (ref 3.87–5.11)
RDW: 14.2 % (ref 11.5–15.5)
WBC: 5 10*3/uL (ref 4.0–10.5)
nRBC: 0 % (ref 0.0–0.2)

## 2019-10-06 LAB — BASIC METABOLIC PANEL
Anion gap: 9 (ref 5–15)
BUN: 17 mg/dL (ref 6–20)
CO2: 39 mmol/L — ABNORMAL HIGH (ref 22–32)
Calcium: 8.5 mg/dL — ABNORMAL LOW (ref 8.9–10.3)
Chloride: 93 mmol/L — ABNORMAL LOW (ref 98–111)
Creatinine, Ser: 0.88 mg/dL (ref 0.44–1.00)
GFR calc Af Amer: >60 mL/min (ref >60)
GFR calc non Af Amer: >60 mL/min (ref >60)
Glucose, Bld: 94 mg/dL (ref 70–99)
Potassium: 4.6 mmol/L (ref 3.5–5.1)
Sodium: 141 mmol/L (ref 135–145)

## 2019-10-06 MED ORDER — GUAIFENESIN ER 600 MG PO TB12
1200.0000 mg | ORAL_TABLET | Freq: Two times a day (BID) | ORAL | Status: DC
  Administered 2019-10-06 – 2019-10-10 (×8): 1200 mg via ORAL
  Filled 2019-10-06 (×9): qty 2

## 2019-10-06 MED ORDER — CHLORHEXIDINE GLUCONATE CLOTH 2 % EX PADS
6.0000 | MEDICATED_PAD | Freq: Every day | CUTANEOUS | Status: DC
  Administered 2019-10-07 – 2019-10-08 (×2): 6 via TOPICAL

## 2019-10-06 MED ORDER — HALOPERIDOL LACTATE 5 MG/ML IJ SOLN: 1.0000 mg | Freq: Four times a day (QID) | INTRAMUSCULAR | Status: DC | PRN

## 2019-10-06 MED ORDER — HALOPERIDOL LACTATE 5 MG/ML IJ SOLN
1.0000 mg | Freq: Once | INTRAMUSCULAR | Status: AC
  Administered 2019-10-06: 1 mg via INTRAVENOUS
  Filled 2019-10-06: qty 1

## 2019-10-06 MED ORDER — CHLORHEXIDINE GLUCONATE 0.12 % MT SOLN
15.0000 mL | Freq: Two times a day (BID) | OROMUCOSAL | Status: DC
  Administered 2019-10-06 – 2019-10-10 (×9): 15 mL via OROMUCOSAL
  Filled 2019-10-06 (×9): qty 15

## 2019-10-06 MED ORDER — ORAL CARE MOUTH RINSE
15.0000 mL | Freq: Two times a day (BID) | OROMUCOSAL | Status: DC
  Administered 2019-10-06 – 2019-10-10 (×7): 15 mL via OROMUCOSAL

## 2019-10-06 MED ORDER — MORPHINE SULFATE (PF) 2 MG/ML IV SOLN: 1.0000 mg | INTRAVENOUS | Status: DC | PRN

## 2019-10-06 NOTE — Consult Note (Addendum)
NAME:  Terri Casey, MRN:  [id number], DOB:  01/27/1960, LOS: 1 ADMISSION DATE:  10/05/2019, CONSULTATION DATE:  10/5  REFERRING MD:  Wynetta Emery, [facility] CHIEF COMPLAINT:  resp distress   Brief History   51 yowf  smoker with 02 dep resp failure/ hypercabic/ cpap at home  admitted with ams and marked elevation of pC02 above baseline improved on bipap and admitted to [facility]/ icu and PCCM consulted pm 10/5   History of present illness   59 y.o. female with medical history significant for COPD, asthma with chronic respiratory failure on 6 L of oxygen, OSA, DM, hypertension. Patient was brought to the ED via EMS due to unresponsiveness.  Per transfer notes, on EMS arrival, patient's O2 sats was 52% on 6 L of oxygen, with nonrebreather patient's O2 sats increased to 90%.  On arrival to the ED patient was responsive only to painful stimuli still on BiPAP, she is awake, able to talk answering questions    She has not been using her CPAP at night and says  compliant with her medications.  She also has not been eating well, and her breathing has been bad for the past few days.  She also reports she has been falling a lot.  Hospitalizations in the recent past for same.  Last hospitalization 8/7- 8/9 also with hypercarbic and hypoxic respiratory failure, PCO2 up to 107, patient required BiPAP.  She has required intubations also in the past.  ED Course: O2 sats 96% on nonrebreather, temperature down to 96.4, blood pressure systolic initially 350 dropped to 88/49.  WBC 10.8.  BNP 310.  Lactic acid 1.3.  Arterial blood gas showed pH of 7.2, PCO2 elevated at 96.4.  PO2 of 96.   Port Chest x-ray-left lower lobe opacity suspicious for pneumonia, suspect small left pleural effusion.  Patient was placed on BiPAP, 3 L bolus normal saline given with improvement in blood pressure, patient was started on broad-spectrum antibiotics IV vancomycin and cefepime.  Hospitalist admit for acute hypoxic and hypercarbic respiratory  failure.  Past Medical History  COPD/ 02 dep but still smoking  OSA DM HBP Hypothyroidism on replacement ? Compliant?  Significant Hospital Events     Consults:  PCCM  10/5  Procedures:    Significant Diagnostic Tests:     Micro Data:  MRSA  PCR  10/4  neg  COVID and flu   PCR 10/4 neg  BC x 2/ 10/4  >>>    Antimicrobials:  Vanc IV 10/4  >>> Cefepime 10/4 >>>    Scheduled Meds: . budesonide  0.5 mg Nebulization BID  . chlorhexidine  15 mL Mouth Rinse BID  . Chlorhexidine Gluconate Cloth  6 each Topical Daily  . guaiFENesin  1,200 mg Oral BID  . insulin aspart  0-9 Units Subcutaneous Q4H  . ipratropium-albuterol  3 mL Nebulization Q6H  . levothyroxine  200 mcg Oral Q0600  . mouth rinse  15 mL Mouth Rinse q12n4p  . methylPREDNISolone (SOLU-MEDROL) injection  60 mg Intravenous Q12H  . rivaroxaban  20 mg Oral Daily   Continuous Infusions: . ceFEPime (MAXIPIME) IV 2 g (10/06/19 1523)   PRN Meds:.acetaminophen **OR** acetaminophen, guaiFENesin-dextromethorphan, ipratropium-albuterol, ondansetron **OR** ondansetron (ZOFRAN) IV, polyethylene glycol  Interim history/subjective:  Better vs am, less agitated p haldol   Objective   Blood pressure (!) 140/46, pulse 100, temperature 98.6 F (37 C), temperature source Axillary, resp. rate (!) 23, height 5\' 9"  (1.753 m), weight 130.1 kg, SpO2 90 %.  Vent Mode: BIPAP FiO2 (%):  [50 %-65 %] 50 % Set Rate:  [15 bmp-24 bmp] 24 bmp PEEP:  [9 cmH20] 9 cmH20   Intake/Output Summary (Last 24 hours) at 10/06/2019 1602 Last data filed at 10/06/2019 0600 Gross per 24 hour  Intake 1573.45 ml  Output 800 ml  Net 773.45 ml   Filed Weights   10/05/19 1209 10/05/19 2300  Weight: 129.6 kg 130.1 kg    Examination: Tmax 98.9  General: chronically  Ill >>> state age Lungs: scattered exp rhonchi  Cardiovascular: RRR no s3  Abdomen: soft/ benign Extremities: trace edema Neuro: oriented to person/ place but not date     I  personally reviewed images and agree with radiology impression as follows:  CXR:   10/5 Stable cardiomegaly. Chronically coarsened interstitial markings, likely smoking-related. New retrocardiac left lower lobe opacity with suspected small left pleural effusion. The right lung is clear.   Resolved Hospital Problem list      Assessment & Plan:   1) acute on chronic hypercarbic and hypoxemic resp failure in setting of aecopd vs CAP with ? Element of narcotic suppression of resp drive  >> improved vs am with rx for copd/ pna/ hold narcs >>> agree with present rx with bipap prn overnight but key is getting her mobilized to chair ? Needs w/u for leg pain which she says is why she took the pain meds    2) copd still smoking on pulmicort/ duoneb ? If limited funds but apparently   Group D in terms of symptom/risk and laba/lama/ICS  therefore appropriate rx at this point  - need set up on d/c with our clinic but should see Halford Chessman or Elsworth Soho since the need to take over her cpap (original provider no longer in practice)   3) OSA/cpap dep - ok to use bipap short term given ? resp drive/ aecopd but presumably can resume cpap at d/c pening f/u in our clinic.  4) Acquired hypothyroidism may also be contributing to variable AMS and hyper carbia  Lab Results  Component Value Date   TSH 32.331 (H) 06/09/2019     >>> check TSH      Labs   CBC: Recent Labs  Lab 10/05/19 1218 10/06/19 0527  WBC 10.8* 5.0  NEUTROABS 9.7*  --   HGB 12.3 11.6*  HCT 45.5 41.0  MCV 118.2* 112.6*  PLT 192 701    Basic Metabolic Panel: Recent Labs  Lab 10/05/19 1218 10/06/19 0527  NA 142 141  K 4.0 4.6  CL 86* 93*  CO2 45* 39*  GLUCOSE 115* 94  BUN 15 17  CREATININE 1.14* 0.88  CALCIUM 8.7* 8.5*   GFR: Estimated Creatinine Clearance: 98.5 mL/min (by C-G formula based on SCr of 0.88 mg/dL). Recent Labs  Lab 10/05/19 1218 10/05/19 1423 10/06/19 0527  WBC 10.8*  --  5.0  LATICACIDVEN 1.3 1.8  --      Liver Function Tests: Recent Labs  Lab 10/05/19 1218  AST 17  ALT 11  ALKPHOS 107  BILITOT 0.6  PROT 6.4*  ALBUMIN 3.4*   No results for input(s): LIPASE, AMYLASE in the last 168 hours. Recent Labs  Lab 10/05/19 1423  AMMONIA 33    ABG    Component Value Date/Time   PHART 7.527 (H) 10/06/2019 1500   PCO2ART 43.0 10/06/2019 1500   PO2ART 79.9 (L) 10/06/2019 1500   HCO3 35.3 (H) 10/06/2019 1500   TCO2 40 (H) 06/13/2019 1026   ACIDBASEDEF 7.7 (H)  08/09/2019 0748   O2SAT 97.8 10/06/2019 1500     Coagulation Profile: Recent Labs  Lab 10/05/19 1423  INR 1.5*    Cardiac Enzymes: No results for input(s): CKTOTAL, CKMB, CKMBINDEX, TROPONINI in the last 168 hours.  HbA1C: Hgb A1c MFr Bld  Date/Time Value Ref Range Status  06/09/2019 01:55 PM 5.1 4.8 - 5.6 % Final    Comment:    (NOTE) Pre diabetes:          5.7%-6.4% Diabetes:              >6.4% Glycemic control for   <7.0% adults with diabetes   02/21/2015 03:40 PM 5.3 <5.7 % Final    Comment:                                                                           According to the ADA Clinical Practice Recommendations for 2011, when HbA1c is used as a screening test:     >=6.5%   Diagnostic of Diabetes Mellitus            (if abnormal result is confirmed)   5.7-6.4%   Increased risk of developing Diabetes Mellitus   References:Diagnosis and Classification of Diabetes Mellitus,Diabetes CBJS,2831,51(VOHYW 1):S62-S69 and Standards of Medical Care in         Diabetes - 2011,Diabetes VPXT,0626,94 (Suppl 1):S11-S61.       CBG: Recent Labs  Lab 10/05/19 2238 10/06/19 0439 10/06/19 0904  GLUCAP 89 97 87      Past Medical History  She,  has a past medical history of Anxiety, Asthma, CHF (congestive heart failure) ([place]), COPD (chronic obstructive pulmonary disease) ([place]), Diabetes mellitus without complication ([place]), Hypertension, and Thyroid disease.   Surgical History    Past Surgical  History:  Procedure Laterality Date  . ABDOMINAL HYSTERECTOMY       Social History   reports that she has been smoking. She has been smoking about 0.50 packs per day. She has never used smokeless tobacco. She reports that she does not drink alcohol and does not use drugs.   Family History   Her family history is not on file.   Allergies Allergies  Allergen Reactions  . Estrogens Hives  . Mustard [employer]  . Strawberry Extract Hives     Home Medications  Prior to Admission medications   Medication Sig Start Date End Date Taking? Authorizing Provider  amLODipine (NORVASC) 10 MG tablet Take 1 tablet (10 mg total) by mouth daily. 06/20/19  Yes Swayze, Ava, DO  atorvastatin (LIPITOR) 20 MG tablet Take 20 mg by mouth at bedtime.    Yes [provider]  bethanechol (URECHOLINE) 5 MG tablet Take 5 mg by mouth 2 (two) times daily. 08/06/19  Yes [provider]  cyclobenzaprine (FLEXERIL) 5 MG tablet Take 5 mg by mouth 3 (three) times daily. 02/23/19  Yes [provider]  levothyroxine (SYNTHROID) 200 MCG tablet Take 1 tablet (200 mcg total) by mouth daily at 6 (six) AM. 06/20/19  Yes Swayze, Ava, DO  loratadine (CLARITIN) 10 MG tablet Take 10 mg by mouth daily.   Yes [provider]  oxyCODONE-acetaminophen (PERCOCET) 10-325 MG tablet Take 1 tablet by mouth 4 (four) times daily  as needed for pain. 07/24/19  Yes [provider]  pantoprazole (PROTONIX) 40 MG tablet Take 1 tablet (40 mg total) by mouth daily. 06/20/19  Yes Swayze, Ava, DO  XARELTO 20 MG TABS tablet Take 20 mg by mouth daily. 05/08/19  Yes [provider]  budesonide (PULMICORT) 0.5 MG/2ML nebulizer solution Take 2 mLs (0.5 mg total) by nebulization 2 (two) times daily. Patient not taking: Reported on 10/05/2019 06/19/19   Swayze, Ava, DO  Cholecalciferol (VITAMIN D3) 5000 units CAPS Take 1 capsule (5,000 Units total) by mouth daily. Patient not taking: Reported on 10/05/2019 02/14/16    Cassandria Anger, MD  dextromethorphan-guaiFENesin [facility] DM) 30-600 MG 12hr tablet Take 1 tablet by mouth 2 (two) times daily. Patient not taking: Reported on 10/05/2019 08/10/19   Barton Dubois, MD  ipratropium-albuterol (DUONEB) 0.5-2.5 (3) MG/3ML SOLN Take 3 mLs by nebulization every 2 (two) hours as needed. Use 3 times daily x 4 days, then every 2 hours as needed. Patient not taking: Reported on 10/05/2019 03/29/15   Eugenie Filler, MD  predniSONE (DELTASONE) 20 MG tablet 3 tablet by mouth daily x1 day; then 2 tablets by mouth daily x3 days; then 1 tablet by mouth daily x3 days; then half tablet by mouth daily x3 days and stop prednisone. Patient not taking: Reported on 10/05/2019 08/10/19   Barton Dubois, MD  Vitamin D, Ergocalciferol, (DRISDOL) 50000 units CAPS capsule TAKE ONE CAPSULE BY MOUTH ONCE A WEEK 10/01/16   Cassandria Anger, MD

## 2019-10-06 NOTE — Plan of Care (Signed)
Pt refusing to wear bipap. Placed on baseline 6L [place].

## 2019-10-06 NOTE — Progress Notes (Signed)
Spoke with pt sister who states patient was originally under hospice care and was a DNR. She was receiving care from hospice once a week to assist with bathing. She has since decided to make herself a full code and is declining hospice care. Patient is under the impression that if she's a DNR and needs treatment or needs to come to the hospital, that she will not receive care.

## 2019-10-06 NOTE — Progress Notes (Addendum)
PROGRESS NOTE   Terri Casey  YSA:[id number] DOB: May 10, 1960 DOA: 10/05/2019 PCP: Patient, No Pcp Per   Chief Complaint  Patient presents with  . Unresponsive    Brief Admission History:  59 y.o. female with medical history significant for severe COPD who continues to smoke had reportedly been on home hospice, asthma with chronic respiratory failure on 6 L of oxygen, OSA, DM, hypertension.  Patient was brought to the ED via EMS due to unresponsiveness.  Per transfer notes, on EMS arrival, patient's O2 sats was 52% on 6 L of oxygen, with nonrebreather patient's O2 sats increased to 90%.  On arrival to the ED patient was responsive only to painful stimuli.  Pt has had multiple recent hospitalizations and intubations (reportedly difficult to intubate), not using home CPAP and after bipap in ER.  Pt is on home hospice and is DNR/DNI.  I have confirmed with family.  Pt does not have capacity to make decisions at this time.     Assessment & Plan:   Principal Problem:   Acute respiratory failure with hypoxia and hypercapnia (HCC) Active Problems:   Asthma   Hypothyroidism   COPD exacerbation (HCC)   Obesity, Class III, BMI 40-49.9 (morbid obesity) (HCC)   AF (paroxysmal atrial fibrillation) (HCC)  Acute on chronic resp failure with hypercarbia and hypoxia - Pt seems to be tolerating bipap therapy and ABG is slowly improving.  She has pneumonia and started on cefepime.  She is on IV steroids and bronchodilators.  Follow up ABG.  Sars 2 coronavirus test is negative.  Treating supportively.  Pt is High Risk for intubation.  Pt does not have capacity to make healthcare decisions at this time. I confirmed with family that she is Full Code and no longer with hospice with [facility].   Requesting pulmonology consultation.  If no meaningful improvements with bipap would need to be intubated.       Metabolic encephalopathy - she has been having intermittent agitation and refusing treatments.   I have ordered low dose haldol for agitation.   Checking another ABG now.    Full Code - confirmed with family son and daughter in law.  Pt does not have capacity at this time to make healthcare decisions.  Palliative consult requested to help clarify goals of care.   Pneumonia and COPD with acute exacerbation - continue IV steroids, IV antibiotics, scheduled bronchodilators.  Sepsis has been ruled out.   Chronic atrial fibrillation - resume home rivaroxaban for full dose anticoagulation for stroke prevention.   Type 2 DM - continue SSI coverage and CBG testing.   Hypothyroidism - resume home levothyroxine.   Hypotension - resolved now, treated with IV fluid bolus and held home amlodipine.    DVT prophylaxis: rivaroxaban Code Status: DNR   Disposition: TBD  Status is: Inpatient  Remains inpatient appropriate because:IV treatments appropriate due to intensity of illness or inability to take PO and Inpatient level of care appropriate due to severity of illness  Dispo: The patient is from: Home              Anticipated d/c is to: Home              Anticipated d/c date is: > 3 days              Patient currently is not medically stable to d/c.  Consultants:   Pulmonary pccm MD  Procedures:   nippv  Antimicrobials:  Cefepime 10/4>>   Subjective:  Pt has been refusing bipap.    Objective: Vitals:   10/06/19 0700 10/06/19 0800 10/06/19 0956 10/06/19 1000  BP: 122/67 (!) 117/46  (!) 152/84  Pulse: 88 (!) 117  86  Resp: (!) 21 (!) 27    Temp: 98.6 F (37 C)     TempSrc: Axillary     SpO2: 97% 95% 100% 100%  Weight:      Height:        Intake/Output Summary (Last 24 hours) at 10/06/2019 1155 Last data filed at 10/06/2019 0600 Gross per 24 hour  Intake 4109.4 ml  Output 800 ml  Net 3309.4 ml   Filed Weights   10/05/19 1209 10/05/19 2300  Weight: 129.6 kg 130.1 kg   Examination:  General exam: chronically ill female, awake, seen on bipap, NAD.  Appears calm and  comfortable  Respiratory system: shallow breathing, exp wheezes heard.  Mild increased work of breathing. Cardiovascular system: normal S1 & S2 heard. Mild JVD.  Gastrointestinal system: Abdomen is obese, nondistended, soft and nontender. No organomegaly or masses felt. Normal bowel sounds heard. Central nervous system: Alert and oriented to person and place only. No focal neurological deficits. Extremities: Symmetric 5 x 5 power. Skin: No rashes, lesions or ulcers Psychiatry:  Mood & affect appropriate.  Intermittent confusion.   Data Reviewed: I have personally reviewed following labs and imaging studies  CBC: Recent Labs  Lab 10/05/19 1218 10/06/19 0527  WBC 10.8* 5.0  NEUTROABS 9.7*  --   HGB 12.3 11.6*  HCT 45.5 41.0  MCV 118.2* 112.6*  PLT 192 244    Basic Metabolic Panel: Recent Labs  Lab 10/05/19 1218 10/06/19 0527  NA 142 141  K 4.0 4.6  CL 86* 93*  CO2 45* 39*  GLUCOSE 115* 94  BUN 15 17  CREATININE 1.14* 0.88  CALCIUM 8.7* 8.5*    GFR: Estimated Creatinine Clearance: 98.5 mL/min (by C-G formula based on SCr of 0.88 mg/dL).  Liver Function Tests: Recent Labs  Lab 10/05/19 1218  AST 17  ALT 11  ALKPHOS 107  BILITOT 0.6  PROT 6.4*  ALBUMIN 3.4*    CBG: Recent Labs  Lab 10/05/19 2238 10/06/19 0439 10/06/19 0904  GLUCAP 89 97 87    Recent Results (from the past 240 hour(s))  Blood Culture (routine x 2)     Status: None (Preliminary result)   Collection Time: 10/05/19 12:18 PM   Specimen: BLOOD RIGHT HAND  Result Value Ref Range Status   Specimen Description BLOOD RIGHT HAND DRAWN BY RN  Final   Special Requests   Final    BOTTLES DRAWN AEROBIC AND ANAEROBIC Blood Culture adequate volume   Culture   Final    NO GROWTH < 24 HOURS Performed at [facility], [address], [place], [place] [address]    Report Status PENDING  Incomplete  Blood Culture (routine x 2)     Status: None (Preliminary result)   Collection Time: 10/05/19 12:23 PM    Specimen: BLOOD LEFT HAND  Result Value Ref Range Status   Specimen Description BLOOD LEFT HAND  Final   Special Requests   Final    BOTTLES DRAWN AEROBIC AND ANAEROBIC Blood Culture adequate volume   Culture   Final    NO GROWTH < 24 HOURS Performed at [facility], [address], [place],  [address]    Report Status PENDING  Incomplete  Respiratory Panel by RT PCR (Flu A&B, Covid) - Nasopharyngeal Swab  Status: None   Collection Time: 10/05/19  2:14 PM   Specimen: Nasopharyngeal Swab  Result Value Ref Range Status   SARS Coronavirus 2 by RT PCR NEGATIVE NEGATIVE Final    Comment: (NOTE) SARS-CoV-2 target nucleic acids are NOT DETECTED.  The SARS-CoV-2 RNA is generally detectable in upper respiratoy specimens during the acute phase of infection. The lowest concentration of SARS-CoV-2 viral copies this assay can detect is 131 copies/mL. A negative result does not preclude SARS-Cov-2 infection and should not be used as the sole basis for treatment or other patient management decisions. A negative result may occur with  improper specimen collection/handling, submission of specimen other than nasopharyngeal swab, presence of viral mutation(s) within the areas targeted by this assay, and inadequate number of viral copies (<131 copies/mL). A negative result must be combined with clinical observations, patient history, and epidemiological information. The expected result is Negative.  Fact Sheet for Patients:  PinkCheek.be  Fact Sheet for Healthcare Providers:  GravelBags.it  This test is no t yet approved or cleared by the [place] FDA and  has been authorized for detection and/or diagnosis of SARS-CoV-2 by FDA under an Emergency Use Authorization (EUA). This EUA will remain  in effect (meaning this test can be used) for the duration of the COVID-19 declaration under Section 564(b)(1) of the Act, 21  U.S.C. section 360bbb-3(b)(1), unless the authorization is terminated or revoked sooner.     Influenza A by PCR NEGATIVE NEGATIVE Final   Influenza B by PCR NEGATIVE NEGATIVE Final    Comment: (NOTE) The Xpert Xpress SARS-CoV-2/FLU/RSV assay is intended as an aid in  the diagnosis of influenza from Nasopharyngeal swab specimens and  should not be used as a sole basis for treatment. Nasal washings and  aspirates are unacceptable for Xpert Xpress SARS-CoV-2/FLU/RSV  testing.  Fact Sheet for Patients: PinkCheek.be  Fact Sheet for Healthcare Providers: GravelBags.it  This test is not yet approved or cleared by the [place] FDA and  has been authorized for detection and/or diagnosis of SARS-CoV-2 by  FDA under an Emergency Use Authorization (EUA). This EUA will remain  in effect (meaning this test can be used) for the duration of the  Covid-19 declaration under Section 564(b)(1) of the Act, 21  U.S.C. section 360bbb-3(b)(1), unless the authorization is  terminated or revoked. Performed at [facility], [address], [place], [place] [address]   MRSA PCR Screening     Status: None   Collection Time: 10/05/19 10:31 PM   Specimen: Nasal Mucosa; Nasopharyngeal  Result Value Ref Range Status   MRSA by PCR NEGATIVE NEGATIVE Final    Comment:        The GeneXpert MRSA Assay (FDA approved for NASAL specimens only), is one component of a comprehensive MRSA colonization surveillance program. It is not intended to diagnose MRSA infection nor to guide or monitor treatment for MRSA infections. Performed at [facility], [address], [place], [place] [address]      Radiology Studies: [facility] 1 View  Result Date: 10/05/2019 CLINICAL DATA:  Unresponsive.  Hypoxia.  Possible sepsis. EXAM: PORTABLE CHEST 1 VIEW COMPARISON:  Chest x-ray dated August 20, 2019. FINDINGS: Stable cardiomegaly. Chronically coarsened  interstitial markings, likely smoking-related. New retrocardiac left lower lobe opacity with suspected small left pleural effusion. The right lung is clear. No pneumothorax. No acute osseous abnormality. IMPRESSION: 1. Left lower lobe opacity suspicious for pneumonia given clinical history. Suspected small left pleural effusion. Electronically Signed   By:  Titus Dubin M.D.   On: 10/05/2019 12:53   Scheduled Meds: . budesonide  0.5 mg Nebulization BID  . chlorhexidine  15 mL Mouth Rinse BID  . Chlorhexidine Gluconate Cloth  6 each Topical Daily  . insulin aspart  0-9 Units Subcutaneous Q4H  . ipratropium-albuterol  3 mL Nebulization Q6H  . levothyroxine  200 mcg Oral Q0600  . mouth rinse  15 mL Mouth Rinse q12n4p  . methylPREDNISolone (SOLU-MEDROL) injection  60 mg Intravenous Q12H  . rivaroxaban  20 mg Oral Daily   Continuous Infusions: . sodium chloride 75 mL/hr at 10/05/19 2316  . ceFEPime (MAXIPIME) IV 2 g (10/06/19 0556)     LOS: 1 day   Critical Care Procedure Note Authorized and Performed by: Murvin Natal MD  Total Critical Care time:  40 mins Due to a high probability of clinically significant, life threatening deterioration, the patient required my highest level of preparedness to intervene emergently and I personally spent this critical care time directly and personally managing the patient.  This critical care time included obtaining a history; examining the patient, pulse oximetry; ordering and review of studies; arranging urgent treatment with development of a management plan; evaluation of patient's response of treatment; frequent reassessment; and discussions with other providers.  This critical care time was performed to assess and manage the high probability of imminent and life threatening deterioration that could result in multi-organ failure.  It was exclusive of separately billable procedures and treating other patients and teaching time.   Irwin Brakeman, MD How to  contact the [facility] Attending or Consulting provider [place] or covering provider during after hours [place], for this patient?  1. Check the care team in [facility] and look for a) attending/consulting [facility] provider listed and b) the [facility] team listed 2. Log into [email or web address] and use [facility]'s universal password to access. If you do not have the password, please contact the hospital operator. 3. Locate the [facility] provider you are looking for under [facility] and page to a number that you can be directly reached. 4. If you still have difficulty reaching the provider, please page the [facility] (Director on Call) for the Hospitalists listed on amion for assistance.  10/06/2019, 11:55 AM

## 2019-10-06 NOTE — Progress Notes (Signed)
10/06/2019 3:16 PM   PT BECOMING INCREASING ENCEPHALOPATHIC, REFUSING TO WEAR BIPAP AND OXYGEN.  REFUSING MEDICAL TREATMENTS.  I SPOKE WITH PATIENT'S FAMILY AND THEY ARE CLEAR THAT THEY WANT PATIENT TO BE FULL CODE AND UNDERSTAND THAT SHE MAY NEVER COME OFF VENTILATOR.  I ORDERED REPEAT ABG AND MADE PULMONOLOGY AWARE THAT SHE LIKELY WILL BE INTUBATED.    Murvin Natal MD

## 2019-10-06 NOTE — Progress Notes (Signed)
CRITICAL VALUE ALERT  Critical Value:  PCO2 80.1  Date & Time Notied:  10/06/19 0630  Provider Notified: Clearence Ped  Orders Received/Actions taken: pending orders; respiratory at bedside

## 2019-10-07 DIAGNOSIS — I5031 Acute diastolic (congestive) heart failure: Secondary | ICD-10-CM

## 2019-10-07 DIAGNOSIS — Z01818 Encounter for other preprocedural examination: Secondary | ICD-10-CM

## 2019-10-07 DIAGNOSIS — Z515 Encounter for palliative care: Secondary | ICD-10-CM

## 2019-10-07 DIAGNOSIS — J9622 Acute and chronic respiratory failure with hypercapnia: Secondary | ICD-10-CM | POA: Diagnosis not present

## 2019-10-07 DIAGNOSIS — J9601 Acute respiratory failure with hypoxia: Secondary | ICD-10-CM

## 2019-10-07 DIAGNOSIS — J9621 Acute and chronic respiratory failure with hypoxia: Secondary | ICD-10-CM | POA: Diagnosis not present

## 2019-10-07 DIAGNOSIS — J441 Chronic obstructive pulmonary disease with (acute) exacerbation: Secondary | ICD-10-CM | POA: Diagnosis not present

## 2019-10-07 DIAGNOSIS — E039 Hypothyroidism, unspecified: Secondary | ICD-10-CM | POA: Diagnosis not present

## 2019-10-07 DIAGNOSIS — J9602 Acute respiratory failure with hypercapnia: Secondary | ICD-10-CM

## 2019-10-07 DIAGNOSIS — G8929 Other chronic pain: Secondary | ICD-10-CM

## 2019-10-07 DIAGNOSIS — Z7189 Other specified counseling: Secondary | ICD-10-CM

## 2019-10-07 DIAGNOSIS — M545 Low back pain, unspecified: Secondary | ICD-10-CM

## 2019-10-07 LAB — FOLATE: Folate: 4.3 ng/mL — ABNORMAL LOW (ref >5.9)

## 2019-10-07 LAB — COMPREHENSIVE METABOLIC PANEL
ALT: 11 U/L (ref 0–44)
AST: 16 U/L (ref 15–41)
Albumin: 3 g/dL — ABNORMAL LOW (ref 3.5–5.0)
Alkaline Phosphatase: 77 U/L (ref 38–126)
Anion gap: 8 (ref 5–15)
BUN: 23 mg/dL — ABNORMAL HIGH (ref 6–20)
CO2: 39 mmol/L — ABNORMAL HIGH (ref 22–32)
Calcium: 8.8 mg/dL — ABNORMAL LOW (ref 8.9–10.3)
Chloride: 92 mmol/L — ABNORMAL LOW (ref 98–111)
Creatinine, Ser: 0.78 mg/dL (ref 0.44–1.00)
GFR calc non Af Amer: >60 mL/min (ref >60)
Glucose, Bld: 123 mg/dL — ABNORMAL HIGH (ref 70–99)
Potassium: 4.3 mmol/L (ref 3.5–5.1)
Sodium: 139 mmol/L (ref 135–145)
Total Bilirubin: 0.8 mg/dL (ref 0.3–1.2)
Total Protein: 5.8 g/dL — ABNORMAL LOW (ref 6.5–8.1)

## 2019-10-07 LAB — FIBRINOGEN: Fibrinogen: 456 mg/dL (ref 210–475)

## 2019-10-07 LAB — CBC WITH DIFFERENTIAL/PLATELET
Abs Immature Granulocytes: 0.02 10*3/uL (ref 0.00–0.07)
Basophils Absolute: 0 10*3/uL (ref 0.0–0.1)
Basophils Relative: 0 %
Eosinophils Absolute: 0 10*3/uL (ref 0.0–0.5)
Eosinophils Relative: 0 %
HCT: 35.9 % — ABNORMAL LOW (ref 36.0–46.0)
Hemoglobin: 10.5 g/dL — ABNORMAL LOW (ref 12.0–15.0)
Immature Granulocytes: 1 %
Lymphocytes Relative: 12 %
Lymphs Abs: 0.5 10*3/uL — ABNORMAL LOW (ref 0.7–4.0)
MCH: 31.2 pg (ref 26.0–34.0)
MCHC: 29.2 g/dL — ABNORMAL LOW (ref 30.0–36.0)
MCV: 106.5 fL — ABNORMAL HIGH (ref 80.0–100.0)
Monocytes Absolute: 0.1 10*3/uL (ref 0.1–1.0)
Monocytes Relative: 2 %
Neutro Abs: 3.6 10*3/uL (ref 1.7–7.7)
Neutrophils Relative %: 85 %
Platelets: 129 10*3/uL — ABNORMAL LOW (ref 150–400)
RBC: 3.37 MIL/uL — ABNORMAL LOW (ref 3.87–5.11)
RDW: 14.3 % (ref 11.5–15.5)
WBC: 4.2 10*3/uL (ref 4.0–10.5)
nRBC: 0 % (ref 0.0–0.2)

## 2019-10-07 LAB — BRAIN NATRIURETIC PEPTIDE: B Natriuretic Peptide: 489 pg/mL — ABNORMAL HIGH (ref 0.0–100.0)

## 2019-10-07 LAB — HEMOGLOBIN A1C
Hgb A1c MFr Bld: 5 % (ref 4.8–5.6)
Mean Plasma Glucose: 96.8 mg/dL

## 2019-10-07 LAB — GLUCOSE, CAPILLARY
Glucose-Capillary: 109 mg/dL — ABNORMAL HIGH (ref 70–99)
Glucose-Capillary: 109 mg/dL — ABNORMAL HIGH (ref 70–99)
Glucose-Capillary: 117 mg/dL — ABNORMAL HIGH (ref 70–99)
Glucose-Capillary: 126 mg/dL — ABNORMAL HIGH (ref 70–99)
Glucose-Capillary: 129 mg/dL — ABNORMAL HIGH (ref 70–99)
Glucose-Capillary: 135 mg/dL — ABNORMAL HIGH (ref 70–99)
Glucose-Capillary: 154 mg/dL — ABNORMAL HIGH (ref 70–99)

## 2019-10-07 LAB — VITAMIN B12: Vitamin B-12: 295 pg/mL (ref 180–914)

## 2019-10-07 LAB — PROCALCITONIN: Procalcitonin: 0.21 ng/mL

## 2019-10-07 LAB — MAGNESIUM: Magnesium: 1.8 mg/dL (ref 1.7–2.4)

## 2019-10-07 LAB — TSH: TSH: 8.446 u[IU]/mL — ABNORMAL HIGH (ref 0.350–4.500)

## 2019-10-07 MED ORDER — INSULIN ASPART 100 UNIT/ML ~~LOC~~ SOLN
0.0000 [IU] | Freq: Three times a day (TID) | SUBCUTANEOUS | Status: DC
  Administered 2019-10-08: 3 [IU] via SUBCUTANEOUS
  Administered 2019-10-08: 2 [IU] via SUBCUTANEOUS
  Administered 2019-10-08 – 2019-10-09 (×2): 1 [IU] via SUBCUTANEOUS

## 2019-10-07 MED ORDER — INSULIN ASPART 100 UNIT/ML ~~LOC~~ SOLN: 0.0000 [IU] | Freq: Every day | SUBCUTANEOUS | Status: DC

## 2019-10-07 MED ORDER — FUROSEMIDE 10 MG/ML IJ SOLN
40.0000 mg | Freq: Every day | INTRAMUSCULAR | Status: DC
  Administered 2019-10-07 – 2019-10-09 (×3): 40 mg via INTRAVENOUS
  Filled 2019-10-07 (×3): qty 4

## 2019-10-07 MED ORDER — OXYCODONE-ACETAMINOPHEN 5-325 MG PO TABS
1.0000 | ORAL_TABLET | Freq: Four times a day (QID) | ORAL | Status: DC | PRN
  Administered 2019-10-07: 1 via ORAL
  Filled 2019-10-07: qty 1

## 2019-10-07 MED ORDER — IPRATROPIUM-ALBUTEROL 0.5-2.5 (3) MG/3ML IN SOLN
3.0000 mL | Freq: Four times a day (QID) | RESPIRATORY_TRACT | Status: DC
  Administered 2019-10-07 (×2): 3 mL via RESPIRATORY_TRACT
  Filled 2019-10-07 (×2): qty 3

## 2019-10-07 MED ORDER — IPRATROPIUM-ALBUTEROL 0.5-2.5 (3) MG/3ML IN SOLN
3.0000 mL | Freq: Three times a day (TID) | RESPIRATORY_TRACT | Status: DC
  Administered 2019-10-08 – 2019-10-10 (×7): 3 mL via RESPIRATORY_TRACT
  Filled 2019-10-07 (×9): qty 3

## 2019-10-07 MED ORDER — OXYCODONE-ACETAMINOPHEN 10-325 MG PO TABS: 1.0000 | ORAL_TABLET | Freq: Four times a day (QID) | ORAL | Status: DC | PRN

## 2019-10-07 MED ORDER — OXYCODONE HCL 5 MG PO TABS
5.0000 mg | ORAL_TABLET | Freq: Four times a day (QID) | ORAL | Status: DC | PRN
  Administered 2019-10-07 – 2019-10-09 (×2): 5 mg via ORAL
  Filled 2019-10-07 (×2): qty 1

## 2019-10-07 NOTE — Consult Note (Signed)
Consultation Note Date: 10/07/2019   Patient Name: Terri Casey  DOB: Oct 14, 1960  MRN: [id number]  Age / Sex: 59 y.o., female  PCP: Patient, No Pcp Per Referring Physician: Orson Eva, MD  Reason for Consultation: Establishing goals of care  HPI/Patient Profile: 59 y.o. female  with past medical history of COPD, asthma, 6L oxygen at home, OSA, diabetes, hypertension, continues to smoke admitted on 10/05/2019 with unresponsiveness and oxygen sats 52% on 6L and improved to 90% with NRB. Placed on BiPAP in ED. Suspicion for pneumonia with COPD exacerbation and sleep apnea poor compliance with CPAP. Of note, reportedly had hospice care with DNR in place for brief time but has since revoked hospice and changed to full code. Palliative care consulted to help clarify fluctuating goals of care. PCCM following.   Clinical Assessment and Goals of Care: I met today with Terri Casey and daughter-in-law, Terri Casey, at bedside. Terri Casey is much improved from yesterday and alert and oriented currently. She has had periodic episodes of confusion at home that is likely multifactorial. Concern for thyroid function, CPAP has been broken, advanced COPD, and opioid use for low back pain (reports she takes oxy 10 mg once a day at bedtime). When she is confused she pulls off her oxygen which exacerbates her confusion and decline. Pain is in low back and then in legs and ankles with swelling.   When discussing goals of care Terri Casey decides that she would want to be intubated if needed (she has been intubated 4 times previously). We further discussed that if she is unable to successfully extubate our course of action. Terri Casey would desire one way extubation and comfort care and would NOT be interested in tracheostomy or long term ventilator support. She would trust son, Johnnette Gourd (and Terri Casey), to speak on her behalf for medical decisions. We  also discussed the benefits and use of hospice for symptom management with focus on comfort and quality of life at end of life.   All questions/concerns addressed. Emotional support provided.   Primary Decision Maker PATIENT    SUMMARY OF RECOMMENDATIONS   - FULL code - Not ready for hospice - NO tracheostomy/long term ventilator support  Code Status/Advance Care Planning:  Full code   Symptom Management:   Chronic pain: Consider trial Cymbalta 30 mg po daily. Tylenol 1000 mg TID. Minimize opioid use. Recommend PT. Lidoderm patch.   Palliative Prophylaxis:   Delirium Protocol, Frequent Pain Assessment, Oral Care and Turn Reposition  Additional Recommendations (Limitations, Scope, Preferences):  Full Scope Treatment  Psycho-social/Spiritual:   Desire for further Chaplaincy support:no  Additional Recommendations: Caregiving  Support/Resources and Education on Hospice  Prognosis:   Overall prognosis poor with advanced COPD.   Discharge Planning: To Be Determined      Primary Diagnoses: Present on Admission:  Acute respiratory failure with hypoxia and hypercapnia (HCC)  AF (paroxysmal atrial fibrillation) (HCC)  COPD exacerbation (HCC)  Obesity, Class III, BMI 40-49.9 (morbid obesity) ([place])  Asthma  Hypothyroidism   I have reviewed  the medical record, interviewed the patient and family, and examined the patient. The following aspects are pertinent.  Past Medical History:  Diagnosis Date   Anxiety    Asthma    CHF (congestive heart failure) (HCC)    COPD (chronic obstructive pulmonary disease) (HCC)    Diabetes mellitus without complication ([place])    Hypertension    Thyroid disease    Social History   Socioeconomic History   Marital status: Single    Spouse name: Not on file   Number of children: Not on file   Years of education: Not on file   Highest education level: Not on file  Occupational History   Not on file  Tobacco Use    Smoking status: Current Every Day Smoker    Packs/day: 0.50   Smokeless tobacco: Never Used  Substance and Sexual Activity   Alcohol use: No   Drug use: No   Sexual activity: Not on file  Other Topics Concern   Not on file  Social History Narrative   Not on file   Social Determinants of Health   Financial Resource Strain:    Difficulty of Paying Living Expenses: Not on file  Food Insecurity:    Worried About Running Out of Food in the Last Year: Not on file   Ran Out of Food in the Last Year: Not on file  Transportation Needs:    Lack of Transportation (Medical): Not on file   Lack of Transportation (Non-Medical): Not on file  Physical Activity:    Days of Exercise per Week: Not on file   Minutes of Exercise per Session: Not on file  Stress:    Feeling of Stress : Not on file  Social Connections:    Frequency of Communication with Friends and Family: Not on file   Frequency of Social Gatherings with Friends and Family: Not on file   Attends Religious Services: Not on file   Active Member of Clubs or Organizations: Not on file   Attends Archivist Meetings: Not on file   Marital Status: Not on file   No family history on file. Scheduled Meds:  budesonide  0.5 mg Nebulization BID   chlorhexidine  15 mL Mouth Rinse BID   Chlorhexidine Gluconate Cloth  6 each Topical Daily   guaiFENesin  1,200 mg Oral BID   insulin aspart  0-9 Units Subcutaneous Q4H   levothyroxine  200 mcg Oral Q0600   mouth rinse  15 mL Mouth Rinse q12n4p   methylPREDNISolone (SOLU-MEDROL) injection  60 mg Intravenous Q12H   rivaroxaban  20 mg Oral Daily   Continuous Infusions:  ceFEPime (MAXIPIME) IV 2 g (10/07/19 0517)   PRN Meds:.acetaminophen **OR** acetaminophen, guaiFENesin-dextromethorphan, ipratropium-albuterol, ondansetron **OR** ondansetron (ZOFRAN) IV, polyethylene glycol Allergies  Allergen Reactions   Estrogens Hives   Mustard Seed Hives    Strawberry Extract Hives   Review of Systems  Constitutional: Positive for activity change and fatigue.  Cardiovascular: Positive for leg swelling.  Musculoskeletal: Positive for back pain.  Neurological: Positive for weakness.    Physical Exam Vitals and nursing note reviewed.  Constitutional:      General: She is not in acute distress.    Appearance: She is ill-appearing.  Cardiovascular:     Rate and Rhythm: Normal rate.  Pulmonary:     Effort: No tachypnea, accessory muscle usage or respiratory distress.  Abdominal:     General: Abdomen is flat.     Palpations: Abdomen is soft.  Neurological:  Mental Status: She is alert and oriented to person, place, and time.     Vital Signs: BP (!) 169/62    Pulse 80    Temp 97.9 F (36.6 C) (Axillary)    Resp 14    Ht _0  (1.753 m)    Wt 130.1 kg    SpO2 96%    BMI 42.36 kg/m  Pain Scale: 0-10   Pain Score: Asleep   SpO2: SpO2: 96 % O2 Device:SpO2: 96 % O2 Flow Rate: .O2 Flow Rate (L/min): 5 L/min  IO: Intake/output summary:   Intake/Output Summary (Last 24 hours) at 10/07/2019 0850 Last data filed at 10/07/2019 0500 Gross per 24 hour  Intake 199.31 ml  Output --  Net 199.31 ml    LBM: Last BM Date:  (PTA) Baseline Weight: Weight: 129.6 kg Most recent weight: Weight: 130.1 kg     Palliative Assessment/Data:     Time In: 1330 Time Out: 1430 Time Total: 60 min Greater than 50%  of this time was spent counseling and coordinating care related to the above assessment and plan.  Signed by: Vinie Sill, NP Palliative Medicine Team Pager # [phone] (M-F 8a-5p) Team Phone # [phone] (Nights/Weekends)

## 2019-10-07 NOTE — Progress Notes (Signed)
PROGRESS NOTE  ELISEA KHADER FMB:[id number] DOB: 12-22-1960 DOA: 10/05/2019 PCP: Patient, No Pcp Per  Brief History:  59 y.o. female with medical history significant for COPD, asthma with chronic respiratory failure on 6 L of oxygen, OSA, DM, hypertension. Patient was brought to the ED via EMS due to unresponsiveness.  Per transfer notes, on EMS arrival, patient's O2 sats was 52% on 6 L of oxygen, with nonrebreather patient's O2 sats increased to 90%.  On arrival to the ED patient was responsive only to painful stimuli. Patient placed on BiPAP in ED with some improvement in mental status.  Initial ABG 7.26/96/96 (0.65).  Patient noted that she has been more sob in last 2-3 days prior to admission.  She continues to smoke.  She has not been using CPAP as prescribed but states she has bee compliant with her meds.  She was hospitalized from 08/06/19 to 08/08/19 due to respiratory failure requiring BiPAP from COPD exacerbation.  She is now on 6L [place] at home.     ED Course: O2 sats 96% on nonrebreather, temperature down to 96.4, blood pressure systolic initially 701 dropped to 88/49.  WBC 10.8.  BNP 310.  Lactic acid 1.3.  Arterial blood gas showed pH of 7.2, PCO2 elevated at 96.4.  PO2 of 96.   Port Chest x-ray-left lower lobe opacity suspicious for pneumonia, suspect small left pleural effusion.  Patient was placed on BiPAP, 3 L bolus normal saline given with improvement in blood pressure, patient was started on broad-spectrum antibiotics IV vancomycin and cefepime.  Hospitalist admit for acute hypoxic and hypercarbic respiratory failure.  Assessment/Plan: Acute on chronichypoxic and hypercarbicrespiratory failure -due to COPD exacerbation in setting of chronic opioid use which may be contributing to hypercarbia -Initial ABG 7.26/96/92 (0.65) -personally reviewed CXR--increased interstitial markings with LLL opacity -wean off BiPAP -chronically on 6L  COPD Exacerbation -continue  pulmicort -start duonebs -continue IV steroids bid -continue mucinex  Acute diasolic CHF -start IV lasix -accurate I/O --06/09/19 Echo--EF 50-55%, indeterminant diastolic, mild enlarged RV, trivial MR  LLL opacity -??PNA -continue cefepime for now -check PCT -checlk BNP -MRSA neg  Acute metabolic Encephalopathy -due to hypercarbia/hypoxia -improving on bipap  Paroxysmal atrial fibrillation -currently in sinus -continue xarelto -personally reviewed EKG--sinus, nonspecific TWI -06/09/19 Echo--EF 50-55%, indeterminant diastolic, mild enlarged RV, trivial MR  Essential Hypertension -resume amlodipine when off BiPAP  Chronic back pain -PMP Aware reviewed--pt receives monthly percocet 10/325, #120  Impaired glucose tolerance -06/09/19 A1C--5.1 -am A1C -SSI while on steroids  Hypothyrodism - resume Synthroid  Morbid Obesity -BMI 42.36 -lifestyle modification  Thrombocytopenia -likely due to chronic hepatic congestion -B12 -folate  Status is: Inpatient  Remains inpatient appropriate because:IV treatments appropriate due to intensity of illness or inability to take PO   Dispo: The patient is from: Home              Anticipated d/c is to: Home              Anticipated d/c date is: 2 days              Patient currently is not medically stable to d/c.        Family Communication: no  Family at bedside  Consultants:  pulm  Code Status:  FULL   DVT Prophylaxis:  xarelto   Procedures: As Listed in Progress Note Above  Antibiotics: None       Subjective: Patient denies fevers, chills,  headache, chest pain, dyspnea, nausea, vomiting, diarrhea, abdominal pain, dysuria, hematuria, hematochezia, and melena. She is breathing better, but remains sob  Objective: Vitals:   10/07/19 0600 10/07/19 0839 10/07/19 0900 10/07/19 1000  BP: (!) 169/62  (!) 171/66 (!) 185/63  Pulse: 77 80 68 77  Resp: 15 14 17 18   Temp:      TempSrc:      SpO2: 96%  97% 96%   Weight:      Height:        Intake/Output Summary (Last 24 hours) at 10/07/2019 1148 Last data filed at 10/07/2019 0500 Gross per 24 hour  Intake 199.31 ml  Output --  Net 199.31 ml   Weight change:  Exam:   General:  Pt is alert, follows commands appropriately, not in acute distress  HEENT: No icterus, No thrush, No neck mass, [place]/AT  Cardiovascular: RRR, S1/S2, no rubs, no gallops  Respiratory: bilateral crackles.  Bibasilar wheeze  Abdomen: Soft/+BS, non tender, non distended, no guarding  Extremities: 2+LE edema, No lymphangitis, No petechiae, No rashes, no synovitis   Data Reviewed: I have personally reviewed following labs and imaging studies Basic Metabolic Panel: Recent Labs  Lab 10/05/19 1218 10/06/19 0527 10/07/19 0402  NA 142 141 139  K 4.0 4.6 4.3  CL 86* 93* 92*  CO2 45* 39* 39*  GLUCOSE 115* 94 123*  BUN 15 17 23*  CREATININE 1.14* 0.88 0.78  CALCIUM 8.7* 8.5* 8.8*  MG  --   --  1.8   Liver Function Tests: Recent Labs  Lab 10/05/19 1218 10/07/19 0402  AST 17 16  ALT 11 11  ALKPHOS 107 77  BILITOT 0.6 0.8  PROT 6.4* 5.8*  ALBUMIN 3.4* 3.0*   No results for input(s): LIPASE, AMYLASE in the last 168 hours. Recent Labs  Lab 10/05/19 1423  AMMONIA 33   Coagulation Profile: Recent Labs  Lab 10/05/19 1423  INR 1.5*   CBC: Recent Labs  Lab 10/05/19 1218 10/06/19 0527 10/07/19 0402  WBC 10.8* 5.0 4.2  NEUTROABS 9.7*  --  3.6  HGB 12.3 11.6* 10.5*  HCT 45.5 41.0 35.9*  MCV 118.2* 112.6* 106.5*  PLT 192 154 129*   Cardiac Enzymes: No results for input(s): CKTOTAL, CKMB, CKMBINDEX, TROPONINI in the last 168 hours. BNP: Invalid input(s): POCBNP CBG: Recent Labs  Lab 10/06/19 1643 10/06/19 2001 10/07/19 0005 10/07/19 0520 10/07/19 0749  GLUCAP 138* 112* 126* 129* 135*   HbA1C: No results for input(s): HGBA1C in the last 72 hours. Urine analysis:    Component Value Date/Time   COLORURINE YELLOW 10/05/2019 2300    APPEARANCEUR HAZY (A) 10/05/2019 2300   LABSPEC 1.017 10/05/2019 2300   PHURINE 5.0 10/05/2019 2300   GLUCOSEU NEGATIVE 10/05/2019 2300   HGBUR NEGATIVE 10/05/2019 2300   BILIRUBINUR NEGATIVE 10/05/2019 2300   KETONESUR NEGATIVE 10/05/2019 2300   PROTEINUR 30 (A) 10/05/2019 2300   NITRITE NEGATIVE 10/05/2019 2300   LEUKOCYTESUR NEGATIVE 10/05/2019 2300   Sepsis Labs: @LABRCNTIP (procalcitonin:4,lacticidven:4) ) Recent Results (from the past 240 hour(s))  Blood Culture (routine x 2)     Status: None (Preliminary result)   Collection Time: 10/05/19 12:18 PM   Specimen: BLOOD RIGHT HAND  Result Value Ref Range Status   Specimen Description BLOOD RIGHT HAND DRAWN BY RN  Final   Special Requests   Final    BOTTLES DRAWN AEROBIC AND ANAEROBIC Blood Culture adequate volume   Culture   Final    NO GROWTH 2 DAYS Performed  at [facility], [address] [place], [place] [address]    Report Status PENDING  Incomplete  Blood Culture (routine x 2)     Status: None (Preliminary result)   Collection Time: 10/05/19 12:23 PM   Specimen: BLOOD LEFT HAND  Result Value Ref Range Status   Specimen Description BLOOD LEFT HAND  Final   Special Requests   Final    BOTTLES DRAWN AEROBIC AND ANAEROBIC Blood Culture adequate volume   Culture   Final    NO GROWTH 2 DAYS Performed at [facility], [address], [place], [place] [address]    Report Status PENDING  Incomplete  Respiratory Panel by RT PCR (Flu A&B, Covid) - Nasopharyngeal Swab     Status: None   Collection Time: 10/05/19  2:14 PM   Specimen: Nasopharyngeal Swab  Result Value Ref Range Status   SARS Coronavirus 2 by RT PCR NEGATIVE NEGATIVE Final    Comment: (NOTE) SARS-CoV-2 target nucleic acids are NOT DETECTED.  The SARS-CoV-2 RNA is generally detectable in upper respiratoy specimens during the acute phase of infection. The lowest concentration of SARS-CoV-2 viral copies this assay can detect is 131 copies/mL. A negative result  does not preclude SARS-Cov-2 infection and should not be used as the sole basis for treatment or other patient management decisions. A negative result may occur with  improper specimen collection/handling, submission of specimen other than nasopharyngeal swab, presence of viral mutation(s) within the areas targeted by this assay, and inadequate number of viral copies (<131 copies/mL). A negative result must be combined with clinical observations, patient history, and epidemiological information. The expected result is Negative.  Fact Sheet for Patients:  PinkCheek.be  Fact Sheet for Healthcare Providers:  GravelBags.it  This test is no t yet approved or cleared by the [place] FDA and  has been authorized for detection and/or diagnosis of SARS-CoV-2 by FDA under an Emergency Use Authorization (EUA). This EUA will remain  in effect (meaning this test can be used) for the duration of the COVID-19 declaration under Section 564(b)(1) of the Act, 21 U.S.C. section 360bbb-3(b)(1), unless the authorization is terminated or revoked sooner.     Influenza A by PCR NEGATIVE NEGATIVE Final   Influenza B by PCR NEGATIVE NEGATIVE Final    Comment: (NOTE) The Xpert Xpress SARS-CoV-2/FLU/RSV assay is intended as an aid in  the diagnosis of influenza from Nasopharyngeal swab specimens and  should not be used as a sole basis for treatment. Nasal washings and  aspirates are unacceptable for Xpert Xpress SARS-CoV-2/FLU/RSV  testing.  Fact Sheet for Patients: PinkCheek.be  Fact Sheet for Healthcare Providers: GravelBags.it  This test is not yet approved or cleared by the [place] FDA and  has been authorized for detection and/or diagnosis of SARS-CoV-2 by  FDA under an Emergency Use Authorization (EUA). This EUA will remain  in effect (meaning this test can be used) for  the duration of the  Covid-19 declaration under Section 564(b)(1) of the Act, 21  U.S.C. section 360bbb-3(b)(1), unless the authorization is  terminated or revoked. Performed at [facility], [address], [place], [place] [address]   MRSA PCR Screening     Status: None   Collection Time: 10/05/19 10:31 PM   Specimen: Nasal Mucosa; Nasopharyngeal  Result Value Ref Range Status   MRSA by PCR NEGATIVE NEGATIVE Final    Comment:        The GeneXpert MRSA Assay (FDA approved for NASAL specimens only), is one component  of a comprehensive MRSA colonization surveillance program. It is not intended to diagnose MRSA infection nor to guide or monitor treatment for MRSA infections. Performed at [facility], [address] [place], [place] [address]      Scheduled Meds: . budesonide  0.5 mg Nebulization BID  . chlorhexidine  15 mL Mouth Rinse BID  . Chlorhexidine Gluconate Cloth  6 each Topical Daily  . guaiFENesin  1,200 mg Oral BID  . insulin aspart  0-9 Units Subcutaneous Q4H  . levothyroxine  200 mcg Oral Q0600  . mouth rinse  15 mL Mouth Rinse q12n4p  . methylPREDNISolone (SOLU-MEDROL) injection  60 mg Intravenous Q12H  . rivaroxaban  20 mg Oral Daily   Continuous Infusions: . ceFEPime (MAXIPIME) IV 2 g (10/07/19 0517)    Procedures/Studies: DG Chest Port 1 View  Result Date: 10/05/2019 CLINICAL DATA:  Unresponsive.  Hypoxia.  Possible sepsis. EXAM: PORTABLE CHEST 1 VIEW COMPARISON:  Chest x-ray dated August 20, 2019. FINDINGS: Stable cardiomegaly. Chronically coarsened interstitial markings, likely smoking-related. New retrocardiac left lower lobe opacity with suspected small left pleural effusion. The right lung is clear. No pneumothorax. No acute osseous abnormality. IMPRESSION: 1. Left lower lobe opacity suspicious for pneumonia given clinical history. Suspected small left pleural effusion. Electronically Signed   By: Titus Dubin M.D.   On: 10/05/2019 12:53    Orson Eva, DO  [facility]  If 7PM-7AM, please contact night-coverage [email or web address] Password [email or web address] 10/07/2019, 11:48 AM   LOS: 2 days

## 2019-10-07 NOTE — Progress Notes (Signed)
Pt is requesting that I get her percocet restarted for her back pain. She states "sometimes I wake up in the middle of the night and I need it." I asked her how bad she is hurting currently and she said 7. Paged midlevel via [email or web address] to restart home percocet

## 2019-10-07 NOTE — TOC Initial Note (Signed)
Transition of Care [facility]) - Initial/Assessment Note    Patient Details  Name: Terri Casey MRN: [id number] Date of Birth: 1960-05-03  Transition of Care [facility]) CM/SW Contact:    Terri Casey, [place] Phone Number: 10/07/2019, 11:28 AM  Clinical Narrative:  Pt admitted due to acute on chronichypoxic and hypercarbicrespiratory failure. Assessment completed due to high risk readmission score. LCSW completed assessment with pt's daughter-in-law, Terri Casey and son, Terri Casey on phone. Pt lives with Terri Casey and Terri Casey and her 61 year old grandchild. Terri Casey is pt's PCA aid Monday through Friday from 8-4 and family is with pt around the clock. Pt requires assist with ADLs, especially since she recently twisted her ankle and has been unable to ambulate. Family initially unsure about d/c plan, but after discussing SNF and need to turn check over to facility, family plan to take pt home. They are requesting hospital bed. Pt is on 6 L home O2 through [employer]. She was supposed to have appointment at [facility] to establish PCP, but missed due to hospitalization. Pt was active with hospice services, but revoked. At this time, family is not interested in resuming as this was pt's wish. TOC will continue to follow.                  Expected Discharge Plan: Home/Self Care Barriers to Discharge: Continued Medical Work up   Patient Goals and CMS Choice Patient states their goals for this hospitalization and ongoing recovery are:: return home   Choice offered to / list presented to : Adult Children  Expected Discharge Plan and Services Expected Discharge Plan: Home/Self Care In-house Referral: Clinical Social Work, Hospice / [place] arrangements for the past 2 months: Single Family Home                                      Prior Living Arrangements/Services Living arrangements for the past 2 months: Single Family Home Lives with:: Adult Children Patient language  and need for interpreter reviewed:: Yes Do you feel safe going back to the place where you live?: Yes      Need for Family Participation in Patient Care: Yes (Comment) Care giver support system in place?: Yes (comment) Current home services: DME (O2, walker, potty chair, wheelchair, shower chair) Criminal Activity/Legal Involvement Pertinent to Current Situation/Hospitalization: No - Comment as needed  Activities of Daily Living Home Assistive Devices/Equipment: Wheelchair, Shower chair with back, Raised toilet seat with rails, Oxygen ADL Screening (condition at time of admission) Patient's cognitive ability adequate to safely complete daily activities?: Yes Is the patient deaf or have difficulty hearing?: No Does the patient have difficulty seeing, even when wearing glasses/contacts?: No Does the patient have difficulty concentrating, remembering, or making decisions?: No Patient able to express need for assistance with ADLs?: Yes Does the patient have difficulty dressing or bathing?: Yes Independently performs ADLs?: No Communication: Independent Dressing (OT): Needs assistance Is this a change from baseline?: Pre-admission baseline Grooming: Needs assistance Is this a change from baseline?: Pre-admission baseline Feeding: Independent Bathing: Needs assistance Is this a change from baseline?: Pre-admission baseline Toileting: Needs assistance Is this a change from baseline?: Pre-admission baseline In/Out Bed: Independent Walks in Home: Needs assistance Is this a change from baseline?: Pre-admission baseline Does the patient have difficulty walking or climbing stairs?: Yes Weakness of Legs: Both Weakness of Arms/Hands: Both  Permission Sought/Granted  Emotional Assessment   Attitude/Demeanor/Rapport: Unable to Assess Affect (typically observed): Unable to Assess Orientation: : Oriented to Self Alcohol / Substance Use: Not Applicable Psych Involvement:  No (comment)  Admission diagnosis:  Acute respiratory failure with hypoxia (HCC) [J96.01] Acute respiratory failure with hypoxia and hypercapnia ([place]) [J96.01, J96.02] Patient Active Problem List   Diagnosis Date Noted  . Acute respiratory failure with hypoxia and hypercapnia ([place]) 10/05/2019  . AF (paroxysmal atrial fibrillation) ([place]) 10/05/2019  . Acute on chronic respiratory failure with hypoxia and hypercapnia ([place]) 06/09/2019  . Obesity, Class III, BMI 40-49.9 (morbid obesity) ([place]) 06/09/2019  . Thrombocytopenia ([place]) 06/09/2019  . 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 Pericardial effusion: Per 2 d echo 03/26/2015 03/27/2015  . Acute on chronic respiratory failure ([place]) 03/26/2015  . PNA (pneumonia) 03/26/2015  . Nocturnal hypoxia 03/25/2015  . Acute respiratory failure with hypoxia ([place]) 03/25/2015  . COPD exacerbation ([place]) 03/25/2015  . COPD (chronic obstructive pulmonary disease) ([place]) 03/25/2015  . Anxiety   . Hypothyroidism 03/03/2015  . Vitamin D deficiency 03/03/2015  . Hyperlipidemia 07/08/2007  . CIGARETTE SMOKER 07/08/2007  . Essential hypertension, benign 07/08/2007  . EMPHYSEMA-on home O2 07/08/2007  . Asthma 07/08/2007  . C O P D 07/08/2007  . Sleep apnea 07/08/2007  . WEIGHT GAIN 07/08/2007   PCP:  Patient, No Pcp Per Pharmacy:   [place], [place] - [address] [place] [address] Phone: [phone] Fax: [phone]     Social Determinants of Health (SDOH) Interventions    Readmission Risk Interventions Readmission Risk Prevention Plan 10/07/2019  Transportation Screening Complete  HRI or [place] Complete  Social Work Consult for [place] Planning/Counseling Complete  Palliative Care Screening Complete  Medication Review Press photographer) Complete  Some recent data might be hidden

## 2019-10-07 NOTE — Progress Notes (Signed)
NAME:  Terri Casey, MRN:  [id number], DOB:  [birth year], LOS: 2 ADMISSION DATE:  10/05/2019, CONSULTATION DATE:  10/5  REFERRING MD:  Wynetta Emery, [facility] CHIEF COMPLAINT:  resp distress   Brief History   6 yowf  smoker with 02 dep resp failure/ hypercabic/ cpap at home  admitted with ams and marked elevation of pC02 above baseline improved on bipap and admitted to [facility]/ icu and PCCM consulted pm 10/5    failure, PCO2 up to 107, patient required BiPAP.  She has required intubations also in the past.     Past Medical History  COPD/ 02 dep but still smoking  OSA DM HBP Hypothyroidism on replacement ? Compliant?  Significant Hospital Events     Consults:  PCCM  10/5  Procedures:    Significant Diagnostic Tests:    Lab Results  Component Value Date   TSH 8.446 (H) 10/07/2019     Micro Data:  MRSA  PCR  10/4  neg  COVID and flu   PCR 10/4 neg  BC x 2   10/4  >>>    Antimicrobials:  Vanc IV 10/4    Cefepime 10/4  >>>    Scheduled Meds:  budesonide  0.5 mg Nebulization BID   chlorhexidine  15 mL Mouth Rinse BID   Chlorhexidine Gluconate Cloth  6 each Topical Daily   furosemide  40 mg Intravenous Daily   guaiFENesin  1,200 mg Oral BID   insulin aspart  0-9 Units Subcutaneous Q4H   ipratropium-albuterol  3 mL Nebulization Q6H   levothyroxine  200 mcg Oral Q0600   mouth rinse  15 mL Mouth Rinse q12n4p   methylPREDNISolone (SOLU-MEDROL) injection  60 mg Intravenous Q12H   rivaroxaban  20 mg Oral Daily   Continuous Infusions:  ceFEPime (MAXIPIME) IV 2 g (10/07/19 0517)   PRN Meds:.acetaminophen **OR** acetaminophen, guaiFENesin-dextromethorphan, ipratropium-albuterol, ondansetron **OR** ondansetron (ZOFRAN) IV, polyethylene glycol  Interim history/subjective:  Much more approp today, no sob or cough   Objective   Blood pressure (!) 158/63, pulse 70, temperature 98.4 F (36.9 C), temperature source Axillary, resp. rate 18, height 5\' 9"  (1.753 m),  weight 130.1 kg, SpO2 96 %.    Vent Mode: BIPAP FiO2 (%):  [40 %] 40 % PEEP:  [5 cmH20] 5 cmH20 Pressure Support:  [5 cmH20] 5 cmH20   Intake/Output Summary (Last 24 hours) at 10/07/2019 1323 Last data filed at 10/07/2019 0500 Gross per 24 hour  Intake 199.31 ml  Output --  Net 199.31 ml   Filed Weights   10/05/19 1209 10/05/19 2300  Weight: 129.6 kg 130.1 kg    Examination: Tmax 98.4 Pt alert, approp nad @ 45 degrees hob on  6lpm NP  No jvd Oropharynx clear,  mucosa nl Neck supple Lungs with distant bs  bilaterally RRR no s3 or or sign murmur Abd obese with limited excursion  Extr warm with no edema or clubbing noted Neuro  Sensorium intact,  no apparent motor deficits      I personally reviewed images and agree with radiology impression as follows:  CXR:   10/4 Stable cardiomegaly. Chronically coarsened interstitial markings, likely smoking-related. New retrocardiac left lower lobe opacity with suspected small left pleural effusion. The right lung is clear.   Resolved Hospital Problem list      Assessment & Plan:   1) acute on chronic hypercarbic and hypoxemic resp failure in setting of aecopd vs CAP with ? Element of narcotic suppression of resp drive assoc with  chemical hypothyroisdism which is prone to make pt more sensitive to narcs /sedatives >>  improved  ams >>>  Mobilize as much as posssible     2) copd still smoking on pulmicort/ duoneb ? If limited funds for outpt maint rx  but apparently she is   Group D in terms of symptom/risk and laba/lama/ICS  therefore appropriate rx at this point if can afford breztri or Trelegy if can afford if not just resume pumicoart   0.5 mg bid and qid duoneb - need set up on d/c with our clinic but should see Halford Chessman or Elsworth Soho since the need to take over her cpap (original provider no longer in practice)   3) OSA/cpap dep - ok to use bipap short term given ? resp drive/ aecopd but presumably can resume cpap at d/c pening f/u in  our clinic.  4) Acquired hypothyroidism may also be contributing to variable AMS and hyper carbia  Lab Results  Component Value Date   TSH 8.446 (H) 10/07/2019     >>> trending down from prior values so rec continue same 200 mcg / day dose until plateau's     Pulmonary f/u as inpt prn, as outpt with Halford Chessman or [place] to address sleep needs going forward as [employer]   CBC: Recent Labs  Lab 10/05/19 1218 10/06/19 0527 10/07/19 0402  WBC 10.8* 5.0 4.2  NEUTROABS 9.7*  --  3.6  HGB 12.3 11.6* 10.5*  HCT 45.5 41.0 35.9*  MCV 118.2* 112.6* 106.5*  PLT 192 154 129*    Basic Metabolic Panel: Recent Labs  Lab 10/05/19 1218 10/06/19 0527 10/07/19 0402  NA 142 141 139  K 4.0 4.6 4.3  CL 86* 93* 92*  CO2 45* 39* 39*  GLUCOSE 115* 94 123*  BUN 15 17 23*  CREATININE 1.14* 0.88 0.78  CALCIUM 8.7* 8.5* 8.8*  MG  --   --  1.8   GFR: Estimated Creatinine Clearance: 108.4 mL/min (by C-G formula based on SCr of 0.78 mg/dL). Recent Labs  Lab 10/05/19 1218 10/05/19 1423 10/06/19 0527 10/07/19 0402  WBC 10.8*  --  5.0 4.2  LATICACIDVEN 1.3 1.8  --   --     Liver Function Tests: Recent Labs  Lab 10/05/19 1218 10/07/19 0402  AST 17 16  ALT 11 11  ALKPHOS 107 77  BILITOT 0.6 0.8  PROT 6.4* 5.8*  ALBUMIN 3.4* 3.0*   No results for input(s): LIPASE, AMYLASE in the last 168 hours. Recent Labs  Lab 10/05/19 1423  AMMONIA 33    ABG    Component Value Date/Time   PHART 7.527 (H) 10/06/2019 1500   PCO2ART 43.0 10/06/2019 1500   PO2ART 79.9 (L) 10/06/2019 1500   HCO3 35.3 (H) 10/06/2019 1500   TCO2 40 (H) 06/13/2019 1026   ACIDBASEDEF 7.7 (H) 08/09/2019 0748   O2SAT 97.8 10/06/2019 1500     Coagulation Profile: Recent Labs  Lab 10/05/19 1423  INR 1.5*    Cardiac Enzymes: No results for input(s): CKTOTAL, CKMB, CKMBINDEX, TROPONINI in the last 168 hours.  HbA1C: Hgb A1c MFr Bld  Date/Time Value Ref Range Status  06/09/2019 01:55 PM 5.1 4.8 - 5.6 %  Final    Comment:    (NOTE) Pre diabetes:          5.7%-6.4% Diabetes:              >6.4% Glycemic control for   <7.0% adults with diabetes   02/21/2015 03:40 PM  5.3 <5.7 % Final    Comment:                                                                           According to the ADA Clinical Practice Recommendations for 2011, when HbA1c is used as a screening test:     >=6.5%   Diagnostic of Diabetes Mellitus            (if abnormal result is confirmed)   5.7-6.4%   Increased risk of developing Diabetes Mellitus   References:Diagnosis and Classification of Diabetes Mellitus,Diabetes SAYT,0160,10(XNATF 1):S62-S69 and Standards of Medical Care in         Diabetes - 2011,Diabetes TDDU,2025,42 (Suppl 1):S11-S61.       CBG: Recent Labs  Lab 10/06/19 2001 10/07/19 0005 10/07/19 0520 10/07/19 0749 10/07/19 1209  GLUCAP 112* 126* 129* 135* 109*      Christinia Gully, MD Pulmonary and [place] [phone]   After 7:00 pm call [place]  [phone]

## 2019-10-08 DIAGNOSIS — D696 Thrombocytopenia, unspecified: Secondary | ICD-10-CM

## 2019-10-08 LAB — BASIC METABOLIC PANEL
Anion gap: 10 (ref 5–15)
BUN: 27 mg/dL — ABNORMAL HIGH (ref 6–20)
CO2: 40 mmol/L — ABNORMAL HIGH (ref 22–32)
Calcium: 8.8 mg/dL — ABNORMAL LOW (ref 8.9–10.3)
Chloride: 88 mmol/L — ABNORMAL LOW (ref 98–111)
Creatinine, Ser: 0.94 mg/dL (ref 0.44–1.00)
GFR calc non Af Amer: >60 mL/min (ref >60)
Glucose, Bld: 140 mg/dL — ABNORMAL HIGH (ref 70–99)
Potassium: 4.4 mmol/L (ref 3.5–5.1)
Sodium: 138 mmol/L (ref 135–145)

## 2019-10-08 LAB — GLUCOSE, CAPILLARY
Glucose-Capillary: 125 mg/dL — ABNORMAL HIGH (ref 70–99)
Glucose-Capillary: 205 mg/dL — ABNORMAL HIGH (ref 70–99)
Glucose-Capillary: 180 mg/dL — ABNORMAL HIGH (ref 70–99)
Glucose-Capillary: 131 mg/dL — ABNORMAL HIGH (ref 70–99)

## 2019-10-08 LAB — MAGNESIUM: Magnesium: 1.7 mg/dL (ref 1.7–2.4)

## 2019-10-08 MED ORDER — AMOXICILLIN-POT CLAVULANATE 875-125 MG PO TABS
1.0000 | ORAL_TABLET | Freq: Two times a day (BID) | ORAL | Status: DC
  Administered 2019-10-08 – 2019-10-10 (×5): 1 via ORAL
  Filled 2019-10-08 (×5): qty 1

## 2019-10-08 MED ORDER — FOLIC ACID 1 MG PO TABS
1.0000 mg | ORAL_TABLET | Freq: Every day | ORAL | Status: DC
  Administered 2019-10-09 – 2019-10-10 (×2): 1 mg via ORAL
  Filled 2019-10-08 (×2): qty 1

## 2019-10-08 MED ORDER — PREDNISONE 20 MG PO TABS
60.0000 mg | ORAL_TABLET | Freq: Every day | ORAL | Status: DC
  Administered 2019-10-09 – 2019-10-10 (×2): 60 mg via ORAL
  Filled 2019-10-08 (×2): qty 3

## 2019-10-08 MED ORDER — AMLODIPINE BESYLATE 5 MG PO TABS
5.0000 mg | ORAL_TABLET | Freq: Every day | ORAL | Status: DC
  Administered 2019-10-08 – 2019-10-09 (×2): 5 mg via ORAL
  Filled 2019-10-08 (×2): qty 1

## 2019-10-08 MED ORDER — INFLUENZA VAC SPLIT QUAD 0.5 ML IM SUSY
0.5000 mL | PREFILLED_SYRINGE | INTRAMUSCULAR | Status: AC
  Administered 2019-10-10: 0.5 mL via INTRAMUSCULAR
  Filled 2019-10-08: qty 0.5

## 2019-10-08 MED ORDER — MAGNESIUM SULFATE 2 GM/50ML IV SOLN
2.0000 g | Freq: Once | INTRAVENOUS | Status: AC
  Administered 2019-10-08: 2 g via INTRAVENOUS
  Filled 2019-10-08: qty 50

## 2019-10-08 NOTE — Progress Notes (Signed)
Patient requires frequent re-positioning of the body in ways that cannot be achieved with  an ordinary bed or wedge pillow, to eliminate pain, reduce pressure, and the head of the  bed to be elevated more than 30 degrees most of the time due to COPD, acute on chronic respiratory failure with hypoxia and hypercapnia, Emphysema on home oxygen.

## 2019-10-08 NOTE — TOC Progression Note (Addendum)
Transition of Care [facility]) - Progression Note    Patient Details  Name: Terri Casey MRN: [id number] Date of Birth: Feb 07, 1960  Transition of Care [facility]) CM/SW Contact  Boneta Lucks, RN Phone Number: 10/08/2019, 1:18 PM  Clinical Narrative:   DC plan is to take patient back home, Family is requesting hospital bed. Order placed, Barbaraann Rondo with [employer] accepted the referral. TOC to follow.  Addendum :  Patient was active with [facility], refused service on 09/19/19 and discharged from Hospice services, per [place]. She will be accepted back when hospice is needed. Not interested in hospice right now.   Expected Discharge Plan: [place] Barriers to Discharge: Continued Medical Work up  Expected Discharge Plan and Services Expected Discharge Plan: [place] In-house Referral: Clinical Social Work, Hospice / Palliative Care Discharge Planning Services: CM Consult   Living arrangements for the past 2 months: Single Family Home                 DME Arranged: Hospital bed DME Agency: [employer] Date DME Agency Contacted: 10/08/19 Time DME Agency Contacted: [id number] Representative spoke with at DME Agency: Barbaraann Rondo      Readmission Risk Interventions Readmission Risk Prevention Plan 10/07/2019  Transportation Screening Complete  HRI or [place] Complete  Social Work Consult for [place] Planning/Counseling Complete  Palliative Care Screening Complete  Medication Review Press photographer) Complete  Some recent data might be hidden

## 2019-10-08 NOTE — Evaluation (Signed)
Physical Therapy Evaluation Patient Details Name: Terri Casey MRN: [id number] DOB: [birth year] Today's Date: 10/08/2019   History of Present Illness  Terri Casey is a 59 y.o. female with medical history significant for COPD, asthma with chronic respiratory failure on 6 L of oxygen, OSA, DM, hypertension.Patient was brought to the ED via EMS due to unresponsiveness.  Per transfer notes, on EMS arrival, patient's O2 sats was 52% on 6 L of oxygen, with nonrebreather patient's O2 sats increased to 90%.  On arrival to the ED patient was responsive only to painful stimuli. At the time of my evaluation, patient is still on BiPAP, she is awake, able to talk answering questions  due to BiPAP history of same.  She still smokes cigarettes.  She has not been using her CPAP at night.  She tells me she has been compliant with her medications.  She also has not been eating well, and her breathing has been bad for the past few days.  She also reports she has been falling a lot. Hospitalizations in the recent past for same.  Last hospitalization 8/7- 8/9 also with hypercarbic and hypoxic respiratory failure, PCO2 up to 107, patient required BiPAP.  She has required intubations also in the past.    Clinical Impression  Patient functioning near baseline for functional mobility and gait, demonstrates good return for taking steps at bedside without AD, preferred RW for longer distances due to fatigue, on 6 LPM with SpO2 dropping from 91% to 87% during ambulation and tolerated sitting up in chair after therapy - RN aware.  Patient will benefit from continued physical therapy in hospital and recommended venue below to increase strength, balance, endurance for safe ADLs and gait.     Follow Up Recommendations Home health PT;Supervision for mobility/OOB;Supervision - Intermittent    Equipment Recommendations  None recommended by PT    Recommendations for Other Services       Precautions / Restrictions  Precautions Precautions: Fall Precaution Comments: Home O2 dependent 6 LPM Restrictions Weight Bearing Restrictions: No      Mobility  Bed Mobility Overal bed mobility: Modified Independent                Transfers Overall transfer level: Needs assistance Equipment used: None;Rolling walker (2 wheeled) Transfers: Sit to/from [employer] Transfers Sit to Stand: Supervision Stand pivot transfers: Supervision       General transfer comment: slightly labored movement, good return for transferring to chair without AD  Ambulation/Gait Ambulation/Gait assistance: Supervision;Min guard Gait Distance (Feet): 45 Feet Assistive device: Rolling walker (2 wheeled) Gait Pattern/deviations: Decreased step length - right;Decreased step length - left;Decreased stride length Gait velocity: decreased   General Gait Details: can take steps at bedside safely without AD, but patient requested use of RW for ambulating out of room due to having to lean on nearby objects for longer distances, patient ambulated into hallway without loss of balance while on 6 LPM with SpO2 dropping from 91% to 87%  Stairs            Wheelchair Mobility    Modified Rankin (Stroke Patients Only)       Balance   Sitting-balance support: No upper extremity supported Sitting balance-Leahy Scale: Good Sitting balance - Comments: seated at EOB   Standing balance support: During functional activity;No upper extremity supported Standing balance-Leahy Scale: Fair Standing balance comment: fair/good using RW  Pertinent Vitals/Pain Pain Assessment: No/denies pain    Home Living Family/patient expects to be discharged to:: Private residence Living Arrangements: Children Available Help at Discharge: Family;Available 24 hours/day;Available PRN/intermittently Type of Home: House Home Access: Stairs to enter Entrance Stairs-Rails: Right (going up  steps) Entrance Stairs-Number of Steps: 3 Home Layout: One level Home Equipment: Bedside commode;Shower seat;Toilet riser;Walker - 2 wheels;Cane - quad;Wheelchair - manual      Prior Function Level of Independence: Needs assistance   Gait / Transfers Assistance Needed: Household and short distanced community ambulator using Quad-cane PRN  ADL's / Homemaking Assistance Needed: asissted by family        Hand Dominance   Dominant Hand: Right    Extremity/Trunk Assessment   Upper Extremity Assessment Upper Extremity Assessment: Overall WFL for tasks assessed    Lower Extremity Assessment Lower Extremity Assessment: Generalized weakness    Cervical / Trunk Assessment Cervical / Trunk Assessment: Normal  Communication   Communication: No difficulties  Cognition Arousal/Alertness: Awake/alert Behavior During Therapy: WFL for tasks assessed/performed Overall Cognitive Status: Within Functional Limits for tasks assessed                                        General Comments      Exercises     Assessment/Plan    PT Assessment Patient needs continued PT services  PT Problem List Decreased strength;Decreased activity tolerance;Decreased balance;Decreased mobility       PT Treatment Interventions Balance training;Gait training;Stair training;Functional mobility training;Therapeutic activities;Therapeutic exercise;Patient/family education    PT Goals (Current goals can be found in the Care Plan section)  Acute Rehab PT Goals Patient Stated Goal: return home with family to assist PT Goal Formulation: With patient Time For Goal Achievement: 10/15/19 Potential to Achieve Goals: Good    Frequency Min 3X/week   Barriers to discharge        Co-evaluation               AM-PAC PT "6 Clicks" Mobility  Outcome Measure Help needed turning from your back to your side while in a flat bed without using bedrails?: None Help needed moving from lying on  your back to sitting on the side of a flat bed without using bedrails?: A Little Help needed moving to and from a bed to a chair (including a wheelchair)?: A Little Help needed standing up from a chair using your arms (e.g., wheelchair or bedside chair)?: A Little Help needed to walk in hospital room?: A Little Help needed climbing 3-5 steps with a railing? : A Little 6 Click Score: 19    End of Session Equipment Utilized During Treatment: Oxygen Activity Tolerance: Patient tolerated treatment well;Patient limited by fatigue Patient left: in chair;with call bell/phone within reach Nurse Communication: Mobility status PT Visit Diagnosis: Unsteadiness on feet (R26.81);Other abnormalities of gait and mobility (R26.89);Muscle weakness (generalized) (M62.81)    Time: 4818-5631 PT Time Calculation (min) (ACUTE ONLY): 30 min   Charges:   PT Evaluation $PT Eval Moderate Complexity: 1 Mod PT Treatments $Therapeutic Activity: 23-37 mins        10:27 AM, 10/08/19 Lonell Grandchild, MPT Physical Therapist with [facility] [phone] office [phone] mobile phone

## 2019-10-08 NOTE — Progress Notes (Signed)
PROGRESS NOTE  Terri Casey NLG:[id number] DOB: [birth year] DOA: 10/05/2019 PCP: Patient, No Pcp Per   Brief History:  59 y.o.femalewith medical history significant forCOPD, asthma with chronic respiratory failure on 6 L of oxygen, OSA, DM, hypertension. Patient was brought to the ED via EMS due to unresponsiveness. Per transfer notes, on EMS arrival, patient's O2 sats was 52% on 6 L of oxygen,with nonrebreather patient's O2 sats increased to 90%.On arrival to the ED patient was responsive only to painful stimuli. Patient placed on BiPAP in ED with some improvement in mental status.  Initial ABG 7.26/96/96 (0.65).  Patient noted that she has been more sob in last 2-3 days prior to admission.  She continues to smoke.  She has not been using CPAP as prescribed but states she has bee compliant with her meds.  She was hospitalized from 08/06/19 to 08/08/19 due to respiratory failure requiring BiPAP from COPD exacerbation.  She is now on 6L [place] at home.     ED Course:O2 sats 96% on nonrebreather, temperature down to 96.4, blood pressure systolic initially 081 dropped to 88/49. WBC 10.8. BNP 310. Lactic acid 1.3. Arterial blood gas showed pH of 7.2, PCO2 elevated at 96.4. PO2 of 96. Port Chest x-ray-left lower lobe opacity suspicious for pneumonia,suspect small left pleural effusion. Patient was placed on BiPAP, 3L bolus normal saline given with improvement in blood pressure, patient was started on broad-spectrum antibiotics IV vancomycin and cefepime. Hospitalist admit for acute hypoxic and hypercarbic respiratory failure.  Assessment/Plan: Acute on chronichypoxic and hypercarbicrespiratory failure -due to COPD exacerbation in setting of chronic opioid use which may be contributing to hypercarbia -Initial ABG 7.26/96/92 (0.65) -personally reviewed CXR--increased interstitial markings with LLL opacity -wean off BiPAP>>6L -chronically on 6L  COPD  Exacerbation -continue pulmicort -continue duonebs -continue IV steroids bid>>po prednisone -continue mucinex  Acute diasolic CHF -Continue  IV lasix -accurate I/O--incomplete, but neg at least 1750 --06/09/19 Echo--EF 50-55%, indeterminant diastolic, mild enlarged RV, trivial MR  LLL opacity -??PNA -likely effusion -d/c cefepime -check PCT--0.21 -checlk BNP--488 -MRSA neg  Acute metabolic Encephalopathy -due to hypercarbia/hypoxia -improving on bipap>>>back to baseline 10/7-->off bipap  Paroxysmal atrial fibrillation -currently in sinus -continue xarelto -personally reviewed EKG--sinus, nonspecific TWI -06/09/19 Echo--EF 50-55%, indeterminant diastolic, mild enlarged RV, trivial MR  Essential Hypertension -resumed amlodipine when off BiPAP  Chronic back pain -PMP Aware reviewed--pt receives monthly percocet 10/325, #120  Impaired glucose tolerance -06/09/19 A1C--5.1 -am A1C -SSI while on steroids  Hypothyrodism - resume Synthroid  Morbid Obesity -BMI 42.36 -lifestyle modification  Thrombocytopenia -likely due to chronic hepatic congestion -B12--295 -folate--4.3-->replete    Status is: Inpatient  Remains inpatient appropriate because:IV treatments appropriate due to intensity of illness or inability to take PO   Dispo: The patient is from: Home  Anticipated d/c is to: Home  Anticipated d/c date is: 2 days  Patient currently is not medically stable to d/c.        Family Communication: no  Family at bedside  Consultants:  pulm  Code Status:  FULL   DVT Prophylaxis:  xarelto   Procedures: As Listed in Progress Note Above  Antibiotics: None    Subjective: Patient is breathing better, but remains sob with minimal exertion.  Denies f/c, cp, n/v/d, abd pain, headache.  Complains of dry mouth.  Objective: Vitals:   10/08/19 0700 10/08/19 0800 10/08/19 1000 10/08/19 1100  BP:  (!) 135/96 (!) 163/71    Pulse: 77  70 78   Resp: 16 12 16    Temp:  97.9 F (36.6 C)  98.1 F (36.7 C)  TempSrc:  Oral  Oral  SpO2: 92% 93% 96%   Weight:      Height:        Intake/Output Summary (Last 24 hours) at 10/08/2019 1120 Last data filed at 10/08/2019 0849 Gross per 24 hour  Intake 2376.67 ml  Output 1750 ml  Net 626.67 ml   Weight change:  Exam:   General:  Pt is alert, follows commands appropriately, not in acute distress  HEENT: No icterus, No thrush, No neck mass, [place]/AT  Cardiovascular: RRR, S1/S2, no rubs, no gallops  Respiratory: diminished breath sounds.  Bibasilar crackles. No wheeze  Abdomen: Soft/+BS, non tender, non distended, no guarding  Extremities: 2+LE edema, No lymphangitis, No petechiae, No rashes, no synovitis   Data Reviewed: I have personally reviewed following labs and imaging studies Basic Metabolic Panel: Recent Labs  Lab 10/05/19 1218 10/06/19 0527 10/07/19 0402 10/08/19 0407  NA 142 141 139 138  K 4.0 4.6 4.3 4.4  CL 86* 93* 92* 88*  CO2 45* 39* 39* 40*  GLUCOSE 115* 94 123* 140*  BUN 15 17 23* 27*  CREATININE 1.14* 0.88 0.78 0.94  CALCIUM 8.7* 8.5* 8.8* 8.8*  MG  --   --  1.8 1.7   Liver Function Tests: Recent Labs  Lab 10/05/19 1218 10/07/19 0402  AST 17 16  ALT 11 11  ALKPHOS 107 77  BILITOT 0.6 0.8  PROT 6.4* 5.8*  ALBUMIN 3.4* 3.0*   No results for input(s): LIPASE, AMYLASE in the last 168 hours. Recent Labs  Lab 10/05/19 1423  AMMONIA 33   Coagulation Profile: Recent Labs  Lab 10/05/19 1423  INR 1.5*   CBC: Recent Labs  Lab 10/05/19 1218 10/06/19 0527 10/07/19 0402  WBC 10.8* 5.0 4.2  NEUTROABS 9.7*  --  3.6  HGB 12.3 11.6* 10.5*  HCT 45.5 41.0 35.9*  MCV 118.2* 112.6* 106.5*  PLT 192 154 129*   Cardiac Enzymes: No results for input(s): CKTOTAL, CKMB, CKMBINDEX, TROPONINI in the last 168 hours. BNP: Invalid input(s): POCBNP CBG: Recent Labs  Lab 10/07/19 1659 10/07/19 2112  10/07/19 2357 10/08/19 0805 10/08/19 1111  GLUCAP 117* 109* 154* 125* 205*   HbA1C: Recent Labs    10/07/19 1327  HGBA1C 5.0   Urine analysis:    Component Value Date/Time   COLORURINE YELLOW 10/05/2019 2300   APPEARANCEUR HAZY (A) 10/05/2019 2300   LABSPEC 1.017 10/05/2019 2300   PHURINE 5.0 10/05/2019 2300   GLUCOSEU NEGATIVE 10/05/2019 2300   HGBUR NEGATIVE 10/05/2019 2300   BILIRUBINUR NEGATIVE 10/05/2019 2300   KETONESUR NEGATIVE 10/05/2019 2300   PROTEINUR 30 (A) 10/05/2019 2300   NITRITE NEGATIVE 10/05/2019 2300   LEUKOCYTESUR NEGATIVE 10/05/2019 2300   Sepsis Labs: @LABRCNTIP (procalcitonin:4,lacticidven:4) ) Recent Results (from the past 240 hour(s))  Blood Culture (routine x 2)     Status: None (Preliminary result)   Collection Time: 10/05/19 12:18 PM   Specimen: BLOOD RIGHT HAND  Result Value Ref Range Status   Specimen Description BLOOD RIGHT HAND DRAWN BY RN  Final   Special Requests   Final    BOTTLES DRAWN AEROBIC AND ANAEROBIC Blood Culture adequate volume   Culture   Final    NO GROWTH 3 DAYS Performed at [facility], [address] [place], [place] [address]    Report Status PENDING  Incomplete  Blood Culture (routine x 2)  Status: None (Preliminary result)   Collection Time: 10/05/19 12:23 PM   Specimen: BLOOD LEFT HAND  Result Value Ref Range Status   Specimen Description BLOOD LEFT HAND  Final   Special Requests   Final    BOTTLES DRAWN AEROBIC AND ANAEROBIC Blood Culture adequate volume   Culture   Final    NO GROWTH 3 DAYS Performed at [facility], [address], [place], [place] [address]    Report Status PENDING  Incomplete  Respiratory Panel by RT PCR (Flu A&B, Covid) - Nasopharyngeal Swab     Status: None   Collection Time: 10/05/19  2:14 PM   Specimen: Nasopharyngeal Swab  Result Value Ref Range Status   SARS Coronavirus 2 by RT PCR NEGATIVE NEGATIVE Final    Comment: (NOTE) SARS-CoV-2 target nucleic acids are NOT  DETECTED.  The SARS-CoV-2 RNA is generally detectable in upper respiratoy specimens during the acute phase of infection. The lowest concentration of SARS-CoV-2 viral copies this assay can detect is 131 copies/mL. A negative result does not preclude SARS-Cov-2 infection and should not be used as the sole basis for treatment or other patient management decisions. A negative result may occur with  improper specimen collection/handling, submission of specimen other than nasopharyngeal swab, presence of viral mutation(s) within the areas targeted by this assay, and inadequate number of viral copies (<131 copies/mL). A negative result must be combined with clinical observations, patient history, and epidemiological information. The expected result is Negative.  Fact Sheet for Patients:  PinkCheek.be  Fact Sheet for Healthcare Providers:  GravelBags.it  This test is no t yet approved or cleared by the [place] FDA and  has been authorized for detection and/or diagnosis of SARS-CoV-2 by FDA under an Emergency Use Authorization (EUA). This EUA will remain  in effect (meaning this test can be used) for the duration of the COVID-19 declaration under Section 564(b)(1) of the Act, 21 U.S.C. section 360bbb-3(b)(1), unless the authorization is terminated or revoked sooner.     Influenza A by PCR NEGATIVE NEGATIVE Final   Influenza B by PCR NEGATIVE NEGATIVE Final    Comment: (NOTE) The Xpert Xpress SARS-CoV-2/FLU/RSV assay is intended as an aid in  the diagnosis of influenza from Nasopharyngeal swab specimens and  should not be used as a sole basis for treatment. Nasal washings and  aspirates are unacceptable for Xpert Xpress SARS-CoV-2/FLU/RSV  testing.  Fact Sheet for Patients: PinkCheek.be  Fact Sheet for Healthcare Providers: GravelBags.it  This test is not yet  approved or cleared by the [place] FDA and  has been authorized for detection and/or diagnosis of SARS-CoV-2 by  FDA under an Emergency Use Authorization (EUA). This EUA will remain  in effect (meaning this test can be used) for the duration of the  Covid-19 declaration under Section 564(b)(1) of the Act, 21  U.S.C. section 360bbb-3(b)(1), unless the authorization is  terminated or revoked. Performed at [facility], [address] [place], [place] [address]   MRSA PCR Screening     Status: None   Collection Time: 10/05/19 10:31 PM   Specimen: Nasal Mucosa; Nasopharyngeal  Result Value Ref Range Status   MRSA by PCR NEGATIVE NEGATIVE Final    Comment:        The GeneXpert MRSA Assay (FDA approved for NASAL specimens only), is one component of a comprehensive MRSA colonization surveillance program. It is not intended to diagnose MRSA infection nor to guide or monitor treatment for MRSA infections. Performed at [facility], [address] [place], [place] [address]      Scheduled Meds:  budesonide  0.5 mg Nebulization BID   chlorhexidine  15 mL Mouth Rinse BID   Chlorhexidine Gluconate Cloth  6 each Topical Daily   furosemide  40 mg Intravenous Daily   guaiFENesin  1,200 mg Oral BID   insulin aspart  0-5 Units Subcutaneous QHS   insulin aspart  0-9 Units Subcutaneous TID WC   ipratropium-albuterol  3 mL Nebulization TID   levothyroxine  200 mcg Oral Q0600   mouth rinse  15 mL Mouth Rinse q12n4p   methylPREDNISolone (SOLU-MEDROL) injection  60 mg Intravenous Q12H   rivaroxaban  20 mg Oral Daily   Continuous Infusions:  ceFEPime (MAXIPIME) IV 2 g (10/08/19 0546)    Procedures/Studies: DG Chest Port 1 View  Result Date: 10/05/2019 CLINICAL DATA:  Unresponsive.  Hypoxia.  Possible sepsis. EXAM: PORTABLE CHEST 1 VIEW COMPARISON:  Chest x-ray dated August 20, 2019. FINDINGS: Stable cardiomegaly. Chronically coarsened interstitial markings, likely  smoking-related. New retrocardiac left lower lobe opacity with suspected small left pleural effusion. The right lung is clear. No pneumothorax. No acute osseous abnormality. IMPRESSION: 1. Left lower lobe opacity suspicious for pneumonia given clinical history. Suspected small left pleural effusion. Electronically Signed   By: Titus Dubin M.D.   On: 10/05/2019 12:53    Orson Eva, DO  [facility]  If 7PM-7AM, please contact night-coverage [email or web address] Password [email or web address] 10/08/2019, 11:20 AM   LOS: 3 days

## 2019-10-08 NOTE — Plan of Care (Signed)
  Problem: Acute Rehab PT Goals(only PT should resolve) Goal: Pt Will Go Supine/Side To Sit Outcome: Progressing Flowsheets (Taken 10/08/2019 1031) Pt will go Supine/Side to Sit: Independently Goal: Patient Will Transfer Sit To/From Stand Outcome: Progressing Flowsheets (Taken 10/08/2019 1031) Patient will transfer sit to/from stand: with modified independence Goal: Pt Will Transfer Bed To Chair/Chair To Bed Outcome: Progressing Flowsheets (Taken 10/08/2019 1031) Pt will Transfer Bed to Chair/Chair to Bed: with modified independence Goal: Pt Will Ambulate Outcome: Progressing Flowsheets (Taken 10/08/2019 1031) Pt will Ambulate:  75 feet  with supervision  with modified independence  with rolling walker  with cane   10:32 AM, 10/08/19 Lonell Grandchild, MPT Physical Therapist with [facility] [phone] office [phone] mobile phone

## 2019-10-08 NOTE — Progress Notes (Signed)
Palliative:  HPI: 59 y.o. female  with past medical history of COPD, asthma, 6L oxygen at home, OSA, diabetes, hypertension, continues to smoke admitted on 10/05/2019 with unresponsiveness and oxygen sats 52% on 6L and improved to 90% with NRB. Placed on BiPAP in ED. Suspicion for pneumonia with COPD exacerbation and sleep apnea poor compliance with CPAP. Of note, reportedly had hospice care with DNR in place for brief time but has since revoked hospice and changed to full code. Palliative care consulted to help clarify fluctuating goals of care. PCCM following.   I met today with Terri Casey and she appears much improved. She is sitting up in recliner and continues to be alert and oriented and in good spirits. Oxygen needs also improved. She reports that she has been living with son Terri Casey and his significant other Terri Casey since February but says they "try to put me out" because she smokes even though their 76 yo grandmother lives there and smokes as well. She continues to confirm that she would want son Terri Casey to be her surrogate Media planner. She also reports that she stopped using CPAP when she moved in with them but was compliant prior to her move. When I ask about CPAP being broken she replies "that's what they say." I expressed to her the importance of being compliant with CPAP. There appears to be some complicated family dynamics. Of note, no changes to goals of care.   We also discussed her chronic low back pain with radiation into both legs. She reports no relief in the past with higher dose Tylenol, lidoderm patches, Cymbalta. Only relief is from oxycodone but she does limit this to once and sometimes twice daily. She will need to continue to follow with her pain provider.   Exam: Alert, oriented. No distress. Sitting up in recliner. No distress. Breathing regular, unlabored. Abd flat. Moving all extremities.   Plan: - Home with home health and palliative to follow.   [place],  NP Palliative Medicine Team Pager [phone] (Please see [email or web address] for schedule) Team Phone [phone]    Greater than 50%  of this time was spent counseling and coordinating care related to the above assessment and plan

## 2019-10-09 LAB — MAGNESIUM: Magnesium: 2.1 mg/dL (ref 1.7–2.4)

## 2019-10-09 LAB — GLUCOSE, CAPILLARY
Glucose-Capillary: 93 mg/dL (ref 70–99)
Glucose-Capillary: 104 mg/dL — ABNORMAL HIGH (ref 70–99)
Glucose-Capillary: 126 mg/dL — ABNORMAL HIGH (ref 70–99)
Glucose-Capillary: 190 mg/dL — ABNORMAL HIGH (ref 70–99)

## 2019-10-09 LAB — BASIC METABOLIC PANEL
Anion gap: 9 (ref 5–15)
BUN: 23 mg/dL — ABNORMAL HIGH (ref 6–20)
CO2: 40 mmol/L — ABNORMAL HIGH (ref 22–32)
Calcium: 8.9 mg/dL (ref 8.9–10.3)
Chloride: 89 mmol/L — ABNORMAL LOW (ref 98–111)
Creatinine, Ser: 0.78 mg/dL (ref 0.44–1.00)
GFR calc non Af Amer: >60 mL/min (ref >60)
Glucose, Bld: 111 mg/dL — ABNORMAL HIGH (ref 70–99)
Potassium: 3.5 mmol/L (ref 3.5–5.1)
Sodium: 138 mmol/L (ref 135–145)

## 2019-10-09 MED ORDER — FUROSEMIDE 10 MG/ML IJ SOLN
40.0000 mg | Freq: Once | INTRAMUSCULAR | Status: AC
  Administered 2019-10-09: 40 mg via INTRAVENOUS
  Filled 2019-10-09: qty 4

## 2019-10-09 MED ORDER — ATORVASTATIN CALCIUM 20 MG PO TABS
20.0000 mg | ORAL_TABLET | Freq: Every day | ORAL | Status: DC
  Administered 2019-10-09 – 2019-10-10 (×2): 20 mg via ORAL
  Filled 2019-10-09 (×2): qty 1

## 2019-10-09 MED ORDER — POTASSIUM CHLORIDE CRYS ER 20 MEQ PO TBCR
20.0000 meq | EXTENDED_RELEASE_TABLET | Freq: Once | ORAL | Status: AC
  Administered 2019-10-09: 20 meq via ORAL
  Filled 2019-10-09: qty 1

## 2019-10-09 MED ORDER — FUROSEMIDE 40 MG PO TABS
40.0000 mg | ORAL_TABLET | Freq: Every day | ORAL | Status: DC
  Administered 2019-10-10: 40 mg via ORAL
  Filled 2019-10-09: qty 1

## 2019-10-09 NOTE — Discharge Summary (Signed)
Physician Discharge Summary  KHELANI KOPS SFK:[id number] DOB: 1960/12/11 DOA: 10/05/2019  PCP: Patient, No Pcp Per  Admit date: 10/05/2019 Discharge date: 10/10/2019  Admitted From: Home Disposition:  Home   Recommendations for Outpatient Follow-up:  1. Follow up with PCP in 1-2 weeks 2. Please obtain BMP/CBC in one week   Home Health: [facility]PT Equipment/Devices: 4LNC  Discharge Condition: Stable CODE STATUS: FULL Diet recommendation: Heart Healthy    Brief/Interim Summary: 59 y.o.femalewith medical history significant forCOPD, asthma with chronic respiratory failure on 6 L of oxygen, OSA, DM, hypertension. Patient was brought to the ED via EMS due to unresponsiveness. Per transfer notes, on EMS arrival, patient's O2 sats was 52% on 6 L of oxygen,with nonrebreather patient's O2 sats increased to 90%.On arrival to the ED patient was responsive only to painful stimuli. Patient placed on BiPAP in ED with some improvement in mental status. Initial ABG 7.26/96/96 (0.65). Patient noted that she has been more sob in last 2-3 days prior to admission. She continues to smoke. She has not been using CPAP as prescribed but states she has bee compliant with her meds. She was hospitalized from 08/06/19 to 08/08/19 due to respiratory failure requiring BiPAP from COPD exacerbation. She is now on 6L [place] at home.    ED Course:O2 sats 96% on nonrebreather, temperature down to 96.4, blood pressure systolic initially 174 dropped to 88/49. WBC 10.8. BNP 310. Lactic acid 1.3. Arterial blood gas showed pH of 7.2, PCO2 elevated at 96.4. PO2 of 96. Port Chest x-ray-left lower lobe opacity suspicious for pneumonia,suspect small left pleural effusion. Patient was placed on BiPAP, 3L bolus normal saline given with improvement in blood pressure, patient was started on broad-spectrum antibiotics IV vancomycin and cefepime. Hospitalist admit for acute hypoxic and hypercarbic respiratory  failure  Discharge Diagnoses:  Acute on chronichypoxic and hypercarbicrespiratory failure -due to COPD exacerbation in setting of chronic opioid use which may be contributing to hypercarbia -Initial ABG 7.26/96/92 (0.65) -personally reviewed CXR--increased interstitial markings with LLL opacity -wean off BiPAP>>6L>>4L with saturations 94-96% -chronically on 4-6L  COPD Exacerbation -continue pulmicort -continueduonebs -continue IV steroids bid>>po prednisone taper -continue mucinex  Acute diasolic CHF -ContinueIV lasix>>po lasix 40 mg daily  -start low dose metoprolol -accurate I/O--incomplete --06/09/19 Echo--EF 50-55%, indeterminant diastolic, mild enlarged RV, trivial MR -discharge weight 274.1 lbs  LLL opacity -??PNA -likely effusion -d/c cefepime -check PCT--0.21 -checlk BNP--488 -MRSA neg -d/c home with amox/clav x 2 days  Acute metabolic Encephalopathy -due to hypercarbia/hypoxia in setting of chronic opioid use -improving on bipap>>>back to baseline 10/7-->off bipap -remained stable at baseline off bipap  Paroxysmal atrial fibrillation -currently in sinus -continue xarelto -personally reviewed EKG--sinus, nonspecific TWI -06/09/19 Echo--EF 50-55%, indeterminant diastolic, mild enlarged RV, trivial MR  Essential Hypertension -started low dose metoprolol  Chronic back pain -PMP Aware reviewed--pt receives monthly percocet 10/325, #120  Impaired glucose tolerance -06/09/19 A1C--5.1 -10/6  A1C--5.0 -SSI while on steroids  Hypothyrodism - resume Synthroid  Morbid Obesity -BMI 42.36 -lifestyle modification  Thrombocytopenia -likely due to chronic hepatic congestion -B12--295 -folate--4.3-->replete    Discharge Instructions   Allergies as of 10/10/2019      Reactions   Estrogens Hives   Mustard Seed Hives   Strawberry Extract Hives      Medication List    STOP taking these medications   amLODipine 10 MG tablet Commonly known  as: NORVASC   budesonide 0.5 MG/2ML nebulizer solution Commonly known as: PULMICORT   dextromethorphan-guaiFENesin 30-600 MG 12hr tablet Commonly known  as: MUCINEX DM   ipratropium-albuterol 0.5-2.5 (3) MG/3ML Soln Commonly known as: DUONEB   Vitamin D3 125 MCG (5000 UT) Caps     TAKE these medications   amoxicillin-clavulanate 875-125 MG tablet Commonly known as: AUGMENTIN Take 1 tablet by mouth every 12 (twelve) hours.   atorvastatin 20 MG tablet Commonly known as: LIPITOR Take 20 mg by mouth at bedtime.   bethanechol 5 MG tablet Commonly known as: URECHOLINE Take 5 mg by mouth 2 (two) times daily.   cyclobenzaprine 5 MG tablet Commonly known as: FLEXERIL Take 5 mg by mouth 3 (three) times daily.   folic acid 1 MG tablet Commonly known as: FOLVITE Take 1 tablet (1 mg total) by mouth daily. Start taking on: October 11, 2019   furosemide 40 MG tablet Commonly known as: LASIX Take 1 tablet (40 mg total) by mouth daily. Start taking on: October 11, 2019   levothyroxine 200 MCG tablet Commonly known as: SYNTHROID Take 1 tablet (200 mcg total) by mouth daily at 6 (six) AM.   loratadine 10 MG tablet Commonly known as: CLARITIN Take 10 mg by mouth daily.   metoprolol tartrate 25 MG tablet Commonly known as: LOPRESSOR Take 0.5 tablets (12.5 mg total) by mouth 2 (two) times daily.   oxyCODONE-acetaminophen 10-325 MG tablet Commonly known as: PERCOCET Take 1 tablet by mouth 4 (four) times daily as needed for pain.   pantoprazole 40 MG tablet Commonly known as: PROTONIX Take 1 tablet (40 mg total) by mouth daily.   predniSONE 10 MG tablet Commonly known as: DELTASONE Take 6 tablets (60 mg total) by mouth daily with breakfast. And decrease by one tablet daily Start taking on: October 11, 2019 What changed:   medication strength  how much to take  how to take this  when to take this  additional instructions   Vitamin D (Ergocalciferol) 1.25 MG (50000  UNIT) Caps capsule Commonly known as: DRISDOL TAKE ONE CAPSULE BY MOUTH ONCE A WEEK   Xarelto 20 MG Tabs tablet Generic drug: rivaroxaban Take 20 mg by mouth daily.            Durable Medical Equipment  (From admission, onward)         Start     Ordered   10/08/19 1303  For home use only DME Hospital bed  Once       Question Answer Comment  Length of Need Lifetime   The above medical condition requires: Patient requires the ability to reposition frequently   Head must be elevated greater than: 30 degrees   Bed type Semi-electric   Support Surface: Gel Overlay      10/08/19 1303          Follow-up Information    [facility] Follow up.   Why:   Hospital Bed             Allergies  Allergen Reactions  . Estrogens Hives  . Mustard [employer]  . Strawberry Extract Hives    Consultations:  pulmonary   Procedures/Studies: DG Chest Port 1 View  Result Date: 10/05/2019 CLINICAL DATA:  Unresponsive.  Hypoxia.  Possible sepsis. EXAM: PORTABLE CHEST 1 VIEW COMPARISON:  Chest x-ray dated August 20, 2019. FINDINGS: Stable cardiomegaly. Chronically coarsened interstitial markings, likely smoking-related. New retrocardiac left lower lobe opacity with suspected small left pleural effusion. The right lung is clear. No pneumothorax. No acute osseous abnormality. IMPRESSION: 1. Left lower lobe opacity suspicious for pneumonia given clinical history. Suspected small left  pleural effusion. Electronically Signed   By: Titus Dubin M.D.   On: 10/05/2019 12:53        Discharge Exam: Vitals:   10/09/19 2004 10/10/19 0741  BP:    Pulse: 83   Resp: 20   Temp:    SpO2: 95% 96%   Vitals:   10/09/19 1354 10/09/19 1911 10/09/19 2004 10/10/19 0741  BP:  122/63    Pulse:  83 83   Resp:  20 20   Temp:  98.7 F (37.1 C)    TempSrc:  Oral    SpO2: 92% 98% 95% 96%  Weight:      Height:        General: Pt is alert, awake, not in acute distress Cardiovascular:  RRR, S1/S2 +, no rubs, no gallops Respiratory: diminished BS.  Bibasilar crackles. No wheeze Abdominal: Soft, NT, ND, bowel sounds + Extremities: trace LE edema, no cyanosis   The results of significant diagnostics from this hospitalization (including imaging, microbiology, ancillary and laboratory) are listed below for reference.    Significant Diagnostic Studies: DG Chest Port 1 View  Result Date: 10/05/2019 CLINICAL DATA:  Unresponsive.  Hypoxia.  Possible sepsis. EXAM: PORTABLE CHEST 1 VIEW COMPARISON:  Chest x-ray dated August 20, 2019. FINDINGS: Stable cardiomegaly. Chronically coarsened interstitial markings, likely smoking-related. New retrocardiac left lower lobe opacity with suspected small left pleural effusion. The right lung is clear. No pneumothorax. No acute osseous abnormality. IMPRESSION: 1. Left lower lobe opacity suspicious for pneumonia given clinical history. Suspected small left pleural effusion. Electronically Signed   By: Titus Dubin M.D.   On: 10/05/2019 12:53     Microbiology: Recent Results (from the past 240 hour(s))  Blood Culture (routine x 2)     Status: None   Collection Time: 10/05/19 12:18 PM   Specimen: BLOOD RIGHT HAND  Result Value Ref Range Status   Specimen Description BLOOD RIGHT HAND DRAWN BY RN  Final   Special Requests   Final    BOTTLES DRAWN AEROBIC AND ANAEROBIC Blood Culture adequate volume   Culture   Final    NO GROWTH 5 DAYS Performed at [facility], [address] [place], [place] [address]    Report Status 10/10/2019 FINAL  Final  Blood Culture (routine x 2)     Status: None   Collection Time: 10/05/19 12:23 PM   Specimen: BLOOD LEFT HAND  Result Value Ref Range Status   Specimen Description BLOOD LEFT HAND  Final   Special Requests   Final    BOTTLES DRAWN AEROBIC AND ANAEROBIC Blood Culture adequate volume   Culture   Final    NO GROWTH 5 DAYS Performed at [facility], [address] [place], [place] [address]     Report Status 10/10/2019 FINAL  Final  Respiratory Panel by RT PCR (Flu A&B, Covid) - Nasopharyngeal Swab     Status: None   Collection Time: 10/05/19  2:14 PM   Specimen: Nasopharyngeal Swab  Result Value Ref Range Status   SARS Coronavirus 2 by RT PCR NEGATIVE NEGATIVE Final    Comment: (NOTE) SARS-CoV-2 target nucleic acids are NOT DETECTED.  The SARS-CoV-2 RNA is generally detectable in upper respiratoy specimens during the acute phase of infection. The lowest concentration of SARS-CoV-2 viral copies this assay can detect is 131 copies/mL. A negative result does not preclude SARS-Cov-2 infection and should not be used as the sole basis for treatment or other patient management decisions. A negative result may occur with  improper specimen collection/handling, submission of specimen other than nasopharyngeal swab, presence of viral mutation(s) within the areas targeted by this assay, and inadequate number of viral copies (<131 copies/mL). A negative result must be combined with clinical observations, patient history, and epidemiological information. The expected result is Negative.  Fact Sheet for Patients:  PinkCheek.be  Fact Sheet for Healthcare Providers:  GravelBags.it  This test is no t yet approved or cleared by the [place] FDA and  has been authorized for detection and/or diagnosis of SARS-CoV-2 by FDA under an Emergency Use Authorization (EUA). This EUA will remain  in effect (meaning this test can be used) for the duration of the COVID-19 declaration under Section 564(b)(1) of the Act, 21 U.S.C. section 360bbb-3(b)(1), unless the authorization is terminated or revoked sooner.     Influenza A by PCR NEGATIVE NEGATIVE Final   Influenza B by PCR NEGATIVE NEGATIVE Final    Comment: (NOTE) The Xpert Xpress SARS-CoV-2/FLU/RSV assay is intended as an aid in  the diagnosis of influenza from Nasopharyngeal swab  specimens and  should not be used as a sole basis for treatment. Nasal washings and  aspirates are unacceptable for Xpert Xpress SARS-CoV-2/FLU/RSV  testing.  Fact Sheet for Patients: PinkCheek.be  Fact Sheet for Healthcare Providers: GravelBags.it  This test is not yet approved or cleared by the [place] FDA and  has been authorized for detection and/or diagnosis of SARS-CoV-2 by  FDA under an Emergency Use Authorization (EUA). This EUA will remain  in effect (meaning this test can be used) for the duration of the  Covid-19 declaration under Section 564(b)(1) of the Act, 21  U.S.C. section 360bbb-3(b)(1), unless the authorization is  terminated or revoked. Performed at [facility], [address], [place], [place] [address]   MRSA PCR Screening     Status: None   Collection Time: 10/05/19 10:31 PM   Specimen: Nasal Mucosa; Nasopharyngeal  Result Value Ref Range Status   MRSA by PCR NEGATIVE NEGATIVE Final    Comment:        The GeneXpert MRSA Assay (FDA approved for NASAL specimens only), is one component of a comprehensive MRSA colonization surveillance program. It is not intended to diagnose MRSA infection nor to guide or monitor treatment for MRSA infections. Performed at [facility], [address], [place], [place] [address]      Labs: Basic Metabolic Panel: Recent Labs  Lab 10/06/19 0527 10/06/19 0527 10/07/19 0402 10/07/19 0402 10/08/19 0407 10/08/19 0407 10/09/19 0709 10/10/19 0646  NA 141  --  139  --  138  --  138 139  K 4.6   < > 4.3   < > 4.4   < > 3.5 3.5  CL 93*  --  92*  --  88*  --  89* 87*  CO2 39*  --  39*  --  40*  --  40* 41*  GLUCOSE 94  --  123*  --  140*  --  111* 96  BUN 17  --  23*  --  27*  --  23* 27*  CREATININE 0.88  --  0.78  --  0.94  --  0.78 0.83  CALCIUM 8.5*  --  8.8*  --  8.8*  --  8.9 8.7*  MG  --   --  1.8  --  1.7  --  2.1 2.1   < > = values in this  interval not displayed.   Liver Function Tests: Recent Labs  Lab  10/05/19 1218 10/07/19 0402  AST 17 16  ALT 11 11  ALKPHOS 107 77  BILITOT 0.6 0.8  PROT 6.4* 5.8*  ALBUMIN 3.4* 3.0*   No results for input(s): LIPASE, AMYLASE in the last 168 hours. Recent Labs  Lab 10/05/19 1423  AMMONIA 33   CBC: Recent Labs  Lab 10/05/19 1218 10/06/19 0527 10/07/19 0402  WBC 10.8* 5.0 4.2  NEUTROABS 9.7*  --  3.6  HGB 12.3 11.6* 10.5*  HCT 45.5 41.0 35.9*  MCV 118.2* 112.6* 106.5*  PLT 192 154 129*   Cardiac Enzymes: No results for input(s): CKTOTAL, CKMB, CKMBINDEX, TROPONINI in the last 168 hours. BNP: Invalid input(s): POCBNP CBG: Recent Labs  Lab 10/09/19 1108 10/09/19 1627 10/09/19 2048 10/10/19 0757 10/10/19 1119  GLUCAP 104* 126* 190* 99 87    Time coordinating discharge:  36 minutes  Signed:  Orson Eva, DO [facility] Pager: [phone] 10/10/2019, 12:30 PM

## 2019-10-09 NOTE — Progress Notes (Signed)
PROGRESS NOTE  Terri Casey NFA:[id number] DOB: November 13, 1960 DOA: 10/05/2019 PCP: Patient, No Pcp Per  Brief History: 59 y.o.femalewith medical history significant forCOPD, asthma with chronic respiratory failure on 6 L of oxygen, OSA, DM, hypertension. Patient was brought to the ED via EMS due to unresponsiveness. Per transfer notes, on EMS arrival, patient's O2 sats was 52% on 6 L of oxygen,with nonrebreather patient's O2 sats increased to 90%.On arrival to the ED patient was responsive only to painful stimuli. Patient placed on BiPAP in ED with some improvement in mental status. Initial ABG 7.26/96/96 (0.65). Patient noted that she has been more sob in last 2-3 days prior to admission. She continues to smoke. She has not been using CPAP as prescribed but states she has bee compliant with her meds. She was hospitalized from 08/06/19 to 08/08/19 due to respiratory failure requiring BiPAP from COPD exacerbation. She is now on 6L [place] at home.    ED Course:O2 sats 96% on nonrebreather, temperature down to 96.4, blood pressure systolic initially 469 dropped to 88/49. WBC 10.8. BNP 310. Lactic acid 1.3. Arterial blood gas showed pH of 7.2, PCO2 elevated at 96.4. PO2 of 96. Port Chest x-ray-left lower lobe opacity suspicious for pneumonia,suspect small left pleural effusion. Patient was placed on BiPAP, 3L bolus normal saline given with improvement in blood pressure, patient was started on broad-spectrum antibiotics IV vancomycin and cefepime. Hospitalist admit for acute hypoxic and hypercarbic respiratory failure.  Assessment/Plan: Acute on chronichypoxic and hypercarbicrespiratory failure -due to COPD exacerbation in setting of chronic opioid use which may be contributing to hypercarbia -Initial ABG 7.26/96/92 (0.65) -personally reviewed CXR--increased interstitial markings with LLL opacity -wean off BiPAP>>6L>>4L -chronically on 4-6L  COPD  Exacerbation -continue pulmicort -continue duonebs -continue IV steroids bid>>po prednisone -continue mucinex  Acute diasolic CHF -Continue  IV lasix -remains volume overloaded -accurate I/O--incomplete, but neg at least 1750 --06/09/19 Echo--EF 50-55%, indeterminant diastolic, mild enlarged RV, trivial MR  LLL opacity -??PNA -likely effusion -d/c cefepime -check PCT--0.21 -checlk BNP--488 -MRSA neg  Acute metabolic Encephalopathy -due to hypercarbia/hypoxia -improving on bipap>>>back to baseline 10/7-->off bipap  Paroxysmal atrial fibrillation -currently in sinus -continue xarelto -personally reviewed EKG--sinus, nonspecific TWI -06/09/19 Echo--EF 50-55%, indeterminant diastolic, mild enlarged RV, trivial MR  Essential Hypertension -resumed amlodipine when off BiPAP  Chronic back pain -PMP Aware reviewed--pt receives monthly percocet 10/325, #120  Impaired glucose tolerance -06/09/19 A1C--5.1 -10/6  A1C--5.0 -SSI while on steroids  Hypothyrodism - resume Synthroid  Morbid Obesity -BMI 42.36 -lifestyle modification  Thrombocytopenia -likely due to chronic hepatic congestion -B12--295 -folate--4.3-->replete    Status is: Inpatient  Remains inpatient appropriate because:IV treatments appropriate due to intensity of illness or inability to take PO   Dispo: The patient is from:Home Anticipated d/c is GE:XBMW Anticipated d/c date is: 10/9 Patient currently is not medically stable to d/c.        Family Communication:noFamily at bedside  Consultants:pulm  Code Status: FULL   DVT Prophylaxis:xarelto   Procedures: As Listed in Progress Note Above  Antibiotics: None    Subjective: Patient is breathing better, but still has sob with minimal exertion.  Has nonproductive cough.  Denies f/c cp, n/v/d, abd pain.  Objective: Vitals:   10/09/19 0400 10/09/19 0423  10/09/19 0728 10/09/19 1105  BP:  (!) 153/71  114/61  Pulse:  82    Resp:  18    Temp:  98 F (36.7 C)    TempSrc:  Oral    SpO2:  98% 98%   Weight: 124.3 kg     Height:        Intake/Output Summary (Last 24 hours) at 10/09/2019 1215 Last data filed at 10/09/2019 1200 Gross per 24 hour  Intake 1080 ml  Output 1000 ml  Net 80 ml   Weight change:  Exam:   General:  Pt is alert, follows commands appropriately, not in acute distress  HEENT: No icterus, No thrush, No neck mass, [place]/AT  Cardiovascular: RRR, S1/S2, no rubs, no gallops  Respiratory: diminished BS.  Bibasilar rales.  Scattered mild wheeze  Abdomen: Soft/+BS, non tender, non distended, no guarding  Extremities: 1+LE edema, No lymphangitis, No petechiae, No rashes, no synovitis   Data Reviewed: I have personally reviewed following labs and imaging studies Basic Metabolic Panel: Recent Labs  Lab 10/05/19 1218 10/06/19 0527 10/07/19 0402 10/08/19 0407 10/09/19 0709  NA 142 141 139 138 138  K 4.0 4.6 4.3 4.4 3.5  CL 86* 93* 92* 88* 89*  CO2 45* 39* 39* 40* 40*  GLUCOSE 115* 94 123* 140* 111*  BUN 15 17 23* 27* 23*  CREATININE 1.14* 0.88 0.78 0.94 0.78  CALCIUM 8.7* 8.5* 8.8* 8.8* 8.9  MG  --   --  1.8 1.7 2.1   Liver Function Tests: Recent Labs  Lab 10/05/19 1218 10/07/19 0402  AST 17 16  ALT 11 11  ALKPHOS 107 77  BILITOT 0.6 0.8  PROT 6.4* 5.8*  ALBUMIN 3.4* 3.0*   No results for input(s): LIPASE, AMYLASE in the last 168 hours. Recent Labs  Lab 10/05/19 1423  AMMONIA 33   Coagulation Profile: Recent Labs  Lab 10/05/19 1423  INR 1.5*   CBC: Recent Labs  Lab 10/05/19 1218 10/06/19 0527 10/07/19 0402  WBC 10.8* 5.0 4.2  NEUTROABS 9.7*  --  3.6  HGB 12.3 11.6* 10.5*  HCT 45.5 41.0 35.9*  MCV 118.2* 112.6* 106.5*  PLT 192 154 129*   Cardiac Enzymes: No results for input(s): CKTOTAL, CKMB, CKMBINDEX, TROPONINI in the last 168 hours. BNP: Invalid input(s): POCBNP CBG: Recent  Labs  Lab 10/08/19 1111 10/08/19 1617 10/08/19 2229 10/09/19 0731 10/09/19 1108  GLUCAP 205* 180* 131* 93 104*   HbA1C: Recent Labs    10/07/19 1327  HGBA1C 5.0   Urine analysis:    Component Value Date/Time   COLORURINE YELLOW 10/05/2019 2300   APPEARANCEUR HAZY (A) 10/05/2019 2300   LABSPEC 1.017 10/05/2019 2300   PHURINE 5.0 10/05/2019 2300   GLUCOSEU NEGATIVE 10/05/2019 2300   HGBUR NEGATIVE 10/05/2019 2300   BILIRUBINUR NEGATIVE 10/05/2019 2300   KETONESUR NEGATIVE 10/05/2019 2300   PROTEINUR 30 (A) 10/05/2019 2300   NITRITE NEGATIVE 10/05/2019 2300   LEUKOCYTESUR NEGATIVE 10/05/2019 2300   Sepsis Labs: @LABRCNTIP (procalcitonin:4,lacticidven:4) ) Recent Results (from the past 240 hour(s))  Blood Culture (routine x 2)     Status: None (Preliminary result)   Collection Time: 10/05/19 12:18 PM   Specimen: BLOOD RIGHT HAND  Result Value Ref Range Status   Specimen Description BLOOD RIGHT HAND DRAWN BY RN  Final   Special Requests   Final    BOTTLES DRAWN AEROBIC AND ANAEROBIC Blood Culture adequate volume   Culture   Final    NO GROWTH 4 DAYS Performed at [facility], [address], [place], [place] [address]    Report Status PENDING  Incomplete  Blood Culture (routine x 2)     Status: None (Preliminary result)   Collection  Time: 10/05/19 12:23 PM   Specimen: BLOOD LEFT HAND  Result Value Ref Range Status   Specimen Description BLOOD LEFT HAND  Final   Special Requests   Final    BOTTLES DRAWN AEROBIC AND ANAEROBIC Blood Culture adequate volume   Culture   Final    NO GROWTH 4 DAYS Performed at [facility], [address] [place], [place] [address]    Report Status PENDING  Incomplete  Respiratory Panel by RT PCR (Flu A&B, Covid) - Nasopharyngeal Swab     Status: None   Collection Time: 10/05/19  2:14 PM   Specimen: Nasopharyngeal Swab  Result Value Ref Range Status   SARS Coronavirus 2 by RT PCR NEGATIVE NEGATIVE Final    Comment: (NOTE) SARS-CoV-2  target nucleic acids are NOT DETECTED.  The SARS-CoV-2 RNA is generally detectable in upper respiratoy specimens during the acute phase of infection. The lowest concentration of SARS-CoV-2 viral copies this assay can detect is 131 copies/mL. A negative result does not preclude SARS-Cov-2 infection and should not be used as the sole basis for treatment or other patient management decisions. A negative result may occur with  improper specimen collection/handling, submission of specimen other than nasopharyngeal swab, presence of viral mutation(s) within the areas targeted by this assay, and inadequate number of viral copies (<131 copies/mL). A negative result must be combined with clinical observations, patient history, and epidemiological information. The expected result is Negative.  Fact Sheet for Patients:  PinkCheek.be  Fact Sheet for Healthcare Providers:  GravelBags.it  This test is no t yet approved or cleared by the [place] FDA and  has been authorized for detection and/or diagnosis of SARS-CoV-2 by FDA under an Emergency Use Authorization (EUA). This EUA will remain  in effect (meaning this test can be used) for the duration of the COVID-19 declaration under Section 564(b)(1) of the Act, 21 U.S.C. section 360bbb-3(b)(1), unless the authorization is terminated or revoked sooner.     Influenza A by PCR NEGATIVE NEGATIVE Final   Influenza B by PCR NEGATIVE NEGATIVE Final    Comment: (NOTE) The Xpert Xpress SARS-CoV-2/FLU/RSV assay is intended as an aid in  the diagnosis of influenza from Nasopharyngeal swab specimens and  should not be used as a sole basis for treatment. Nasal washings and  aspirates are unacceptable for Xpert Xpress SARS-CoV-2/FLU/RSV  testing.  Fact Sheet for Patients: PinkCheek.be  Fact Sheet for Healthcare  Providers: GravelBags.it  This test is not yet approved or cleared by the [place] FDA and  has been authorized for detection and/or diagnosis of SARS-CoV-2 by  FDA under an Emergency Use Authorization (EUA). This EUA will remain  in effect (meaning this test can be used) for the duration of the  Covid-19 declaration under Section 564(b)(1) of the Act, 21  U.S.C. section 360bbb-3(b)(1), unless the authorization is  terminated or revoked. Performed at [facility], [address], [place], [place] [address]   MRSA PCR Screening     Status: None   Collection Time: 10/05/19 10:31 PM   Specimen: Nasal Mucosa; Nasopharyngeal  Result Value Ref Range Status   MRSA by PCR NEGATIVE NEGATIVE Final    Comment:        The GeneXpert MRSA Assay (FDA approved for NASAL specimens only), is one component of a comprehensive MRSA colonization surveillance program. It is not intended to diagnose MRSA infection nor to guide or monitor treatment for MRSA infections. Performed at [facility], [address], [place], [place] [address]  Scheduled Meds: . amLODipine  5 mg Oral Daily  . amoxicillin-clavulanate  1 tablet Oral Q12H  . budesonide  0.5 mg Nebulization BID  . chlorhexidine  15 mL Mouth Rinse BID  . Chlorhexidine Gluconate Cloth  6 each Topical Daily  . folic acid  1 mg Oral Daily  . furosemide  40 mg Intravenous Daily  . guaiFENesin  1,200 mg Oral BID  . influenza vac split quadrivalent PF  0.5 mL Intramuscular Tomorrow-1000  . insulin aspart  0-5 Units Subcutaneous QHS  . insulin aspart  0-9 Units Subcutaneous TID WC  . ipratropium-albuterol  3 mL Nebulization TID  . levothyroxine  200 mcg Oral Q0600  . mouth rinse  15 mL Mouth Rinse q12n4p  . potassium chloride  20 mEq Oral Once  . predniSONE  60 mg Oral Q breakfast  . rivaroxaban  20 mg Oral Daily   Continuous Infusions:  Procedures/Studies: DG Chest Port 1 View  Result Date:  10/05/2019 CLINICAL DATA:  Unresponsive.  Hypoxia.  Possible sepsis. EXAM: PORTABLE CHEST 1 VIEW COMPARISON:  Chest x-ray dated August 20, 2019. FINDINGS: Stable cardiomegaly. Chronically coarsened interstitial markings, likely smoking-related. New retrocardiac left lower lobe opacity with suspected small left pleural effusion. The right lung is clear. No pneumothorax. No acute osseous abnormality. IMPRESSION: 1. Left lower lobe opacity suspicious for pneumonia given clinical history. Suspected small left pleural effusion. Electronically Signed   By: Titus Dubin M.D.   On: 10/05/2019 12:53    Orson Eva, DO  [facility]  If 7PM-7AM, please contact night-coverage [email or web address] Password [email or web address] 10/09/2019, 12:15 PM   LOS: 4 days

## 2019-10-10 LAB — CULTURE, BLOOD (ROUTINE X 2)
Culture: NO GROWTH
Culture: NO GROWTH
Special Requests: ADEQUATE
Special Requests: ADEQUATE

## 2019-10-10 LAB — MAGNESIUM: Magnesium: 2.1 mg/dL (ref 1.7–2.4)

## 2019-10-10 LAB — BASIC METABOLIC PANEL
Anion gap: 11 (ref 5–15)
BUN: 27 mg/dL — ABNORMAL HIGH (ref 6–20)
CO2: 41 mmol/L — ABNORMAL HIGH (ref 22–32)
Calcium: 8.7 mg/dL — ABNORMAL LOW (ref 8.9–10.3)
Chloride: 87 mmol/L — ABNORMAL LOW (ref 98–111)
Creatinine, Ser: 0.83 mg/dL (ref 0.44–1.00)
GFR, Estimated: >60 mL/min (ref >60)
Glucose, Bld: 96 mg/dL (ref 70–99)
Potassium: 3.5 mmol/L (ref 3.5–5.1)
Sodium: 139 mmol/L (ref 135–145)

## 2019-10-10 LAB — GLUCOSE, CAPILLARY
Glucose-Capillary: 99 mg/dL (ref 70–99)
Glucose-Capillary: 87 mg/dL (ref 70–99)
Glucose-Capillary: 66 mg/dL — ABNORMAL LOW (ref 70–99)
Glucose-Capillary: 183 mg/dL — ABNORMAL HIGH (ref 70–99)

## 2019-10-10 MED ORDER — PREDNISONE 10 MG PO TABS: 60.0000 mg | ORAL_TABLET | Freq: Every day | ORAL | 0 refills | Status: DC

## 2019-10-10 MED ORDER — PREDNISONE 10 MG PO TABS
60.0000 mg | ORAL_TABLET | Freq: Every day | ORAL | 0 refills | Status: DC
Start: 2019-10-11 — End: 2019-10-10

## 2019-10-10 MED ORDER — AMOXICILLIN-POT CLAVULANATE 875-125 MG PO TABS: 1.0000 | ORAL_TABLET | Freq: Two times a day (BID) | ORAL | 0 refills | Status: DC

## 2019-10-10 MED ORDER — METOPROLOL TARTRATE 25 MG PO TABS: 12.5000 mg | ORAL_TABLET | Freq: Two times a day (BID) | ORAL | 1 refills | Status: DC

## 2019-10-10 MED ORDER — FOLIC ACID 1 MG PO TABS: 1.0000 mg | ORAL_TABLET | Freq: Every day | ORAL | Status: AC

## 2019-10-10 MED ORDER — FUROSEMIDE 40 MG PO TABS
40.0000 mg | ORAL_TABLET | Freq: Every day | ORAL | 1 refills | Status: DC
Start: 2019-10-11 — End: 2019-10-10

## 2019-10-10 MED ORDER — FUROSEMIDE 40 MG PO TABS: 40.0000 mg | ORAL_TABLET | Freq: Every day | ORAL | 1 refills | Status: DC

## 2019-10-10 NOTE — Progress Notes (Addendum)
Pt discharged via WC to POV with all personal belongings in her possession. Daughter brought pt's home O2 for discharge transport.  Addendum: Pt's blood sugar at 1630 was 66 mg/dl. Pt with no s/s hypoglycemia. Pt given crackers and soda pop, and once dinner arrived, pt ate >50% of supper meal. Blood sugar rechecked prior to discharge, resulted 183 mg/dl.

## 2020-06-23 ENCOUNTER — Other Ambulatory Visit: Payer: Self-pay

## 2020-06-23 ENCOUNTER — Encounter: Payer: Self-pay | Admitting: Internal Medicine

## 2020-06-23 ENCOUNTER — Ambulatory Visit (INDEPENDENT_AMBULATORY_CARE_PROVIDER_SITE_OTHER): Payer: Medicaid Other | Admitting: Internal Medicine

## 2020-06-23 VITALS — BP 104/60 | HR 84 | Temp 97.7°F | Ht 69.0 in | Wt 265.0 lb

## 2020-06-23 DIAGNOSIS — J449 Chronic obstructive pulmonary disease, unspecified: Secondary | ICD-10-CM | POA: Diagnosis not present

## 2020-06-23 DIAGNOSIS — J9612 Chronic respiratory failure with hypercapnia: Secondary | ICD-10-CM

## 2020-06-23 DIAGNOSIS — J9611 Chronic respiratory failure with hypoxia: Secondary | ICD-10-CM | POA: Insufficient documentation

## 2020-06-23 DIAGNOSIS — F17200 Nicotine dependence, unspecified, uncomplicated: Secondary | ICD-10-CM

## 2020-06-23 DIAGNOSIS — F17210 Nicotine dependence, cigarettes, uncomplicated: Secondary | ICD-10-CM

## 2020-06-23 MED ORDER — STIOLTO RESPIMAT 2.5-2.5 MCG/ACT IN AERS: INHALATION_SPRAY | RESPIRATORY_TRACT | 11 refills | Status: DC

## 2020-06-23 MED ORDER — ALBUTEROL SULFATE (2.5 MG/3ML) 0.083% IN NEBU: 2.5000 mg | INHALATION_SOLUTION | Freq: Four times a day (QID) | RESPIRATORY_TRACT | 12 refills | Status: DC | PRN

## 2020-06-23 MED ORDER — ALBUTEROL SULFATE HFA 108 (90 BASE) MCG/ACT IN AERS: INHALATION_SPRAY | RESPIRATORY_TRACT | 11 refills | Status: DC

## 2020-06-23 NOTE — Patient Instructions (Addendum)
Use your bubble chamber 2.5 lpm sitting and sleeping   Make sure you check your oxygen saturation  at your highest level of activity  to be sure it stays over 90% and adjust  02 flow upward to maintain this level if needed but remember to turn it back to previous settings when you stop (to conserve your supply).   Stop advair (purple disc)  Pantoprazole is 40 mg Take 30-60 min before first meal of the day   Plan A = Automatic = Always=    stiolto 2 puffs each am   Work on inhaler technique:  relax and gently blow all the way out then take a nice smooth full deep breath back in, triggering the inhaler at same time you start breathing in.  Hold for up to 5 seconds if you can.  Rinse and gargle with water when done.  If mouth or throat bother you at all,  try brushing teeth/gums/tongue with arm and hammer toothpaste/ make a slurry and gargle and spit out.       Plan B = Backup (to supplement plan A, not to replace it) Only use your albuterol inhaler as a rescue medication to be used if you can't catch your breath by resting or doing a relaxed purse lip breathing pattern.  - The less you use it, the better it will work when you need it. - Ok to use the inhaler up to 2 puffs  every 4 hours if you must but call for appointment if use goes up over your usual need - Don't leave home without it !!  (think of it like the spare tire for your car)   Plan C = Crisis (instead of Plan B but only if Plan B stops working) - only use your albuterol nebulizer if you first try Plan B and it fails to help > ok to use the nebulizer up to every 4 hours but if start needing it regularly call for immediate appointment   The key is to stop smoking completely before smoking completely stops you!    Please schedule a follow up office visit in 6 weeks, call sooner if needed with all medications /inhalers/ solutions in hand so we can verify exactly what you are taking. This includes all medications from all doctors and  over the counters    >>> PFT's on return

## 2020-06-23 NOTE — Assessment & Plan Note (Addendum)
HC03   10/10/2019   = 41  -  06/23/2020   Walked RA  approx   200 ft  @ moderate pace  stopped due to  Sob/leg pain  on 2lpm but sats still 91%    Advised on goal of keeping sats > 90%   >>> Make sure you check your oxygen saturation  at your highest level of activity  to be sure it stays over 90% and adjust  02 flow upward to maintain this level if needed but remember to turn it back to previous settings when you stop (to conserve your supply).    F/u with pft's in 6 weeks and then set up sleep medicine f/u as previously rec  Each maintenance medication was reviewed in detail including emphasizing most importantly the difference between maintenance and prns and under what circumstances the prns are to be triggered using an action plan format where appropriate.  Total time for H and P, chart review, counseling, reviewing smi/02 device(s) , directly observing portions of ambulatory 02 saturation study/ and generating customized AVS unique to this office visit / same day charting  >  55 min

## 2020-06-23 NOTE — Progress Notes (Signed)
Terri Casey, female    DOB: 1960/08/02 MRN: [id number]   Brief patient profile:  23 yowf active smoker with onset of doe 2016 on alb / prednisone/02  and on stiiolto x 2020 just one puff daily then covid pna 10/2019   Admit date: 10/05/2019 Discharge date: 10/10/2019   Home Health: HHPT Equipment/Devices: 4LNC   Brief/Interim Summary: 60 y.o. female with medical history significant for COPD, asthma with chronic respiratory failure on 6 L of oxygen, OSA, DM, hypertension. Patient was brought to the ED via EMS due to unresponsiveness.  Per transfer notes, on EMS arrival, patient's O2 sats was 52% on 6 L of oxygen, with nonrebreather patient's O2 sats increased to 90%.  On arrival to the ED patient was responsive only to painful stimuli. Patient placed on BiPAP in ED with some improvement in mental status.  Initial ABG 7.26/96/96 (0.65).  Patient noted that she has been more sob in last 2-3 days prior to admission. .  She has not been using CPAP  She was hospitalized from 08/06/19 to 08/08/19 due to respiratory failure requiring BiPAP from COPD exacerbation.    ED Course: O2 sats 96% on nonrebreather, temperature down to 96.4, blood pressure systolic initially 836 dropped to 88/49.  WBC 10.8.  BNP 310.  Lactic acid 1.3.  Arterial blood gas showed pH of 7.2, PCO2 elevated at 96.4.  PO2 of 96.   Port Chest x-ray-left lower lobe opacity suspicious for pneumonia, suspect small left pleural effusion.  Patient was placed on BiPAP, 3 L bolus normal saline given with improvement in blood pressure, patient was started on broad-spectrum antibiotics IV vancomycin and cefepime.  Hospitalist admit for acute hypoxic and hypercarbic respiratory failure   Discharge Diagnoses:  Acute on chronic hypoxic and hypercarbic respiratory failure -due to COPD exacerbation in setting of chronic opioid use which may be contributing to hypercarbia -Initial ABG 7.26/96/92 (0.65) -personally reviewed CXR--increased  interstitial markings with LLL opacity -wean off BiPAP>>6L>>4L with saturations 94-96% -chronically on 4-6L   COPD Exacerbation -continue pulmicort -continue duonebs -continue IV steroids bid>>po prednisone taper -continue mucinex   Acute diasolic CHF -Continue  IV lasix>>po lasix 40 mg daily  -start low dose metoprolol -accurate I/O--incomplete --06/09/19 Echo--EF 50-55%, indeterminant diastolic, mild enlarged RV, trivial MR -discharge weight 274.1 lbs   LLL opacity -??PNA -likely effusion -d/c cefepime -check PCT--0.21 -checlk BNP--488 -MRSA neg -d/c home with amox/clav x 2 days   Acute metabolic Encephalopathy -due to hypercarbia/hypoxia in setting of chronic opioid use -improving on bipap>>>back to baseline 10/7-->off bipap -remained stable at baseline off bipap   Paroxysmal atrial fibrillation -currently in sinus -continue xarelto -personally reviewed EKG--sinus, nonspecific TWI -06/09/19 Echo--EF 50-55%, indeterminant diastolic, mild enlarged RV, trivial MR   Essential Hypertension -started low dose metoprolol       Says accident age 3 damaged nasal passages ever since can't breathe well thru either nostril with multiple ent evals "not helpful" says never seen in tertiary center  or [place].     History of Present Illness  06/23/2020  Pulmonary/ 1st office eval/ Terri Casey / [place] [facility] still smoking on advair and [employer] technical [employer] Complaint  Patient presents with   Consult    COPD and Emphysema for several years, She reports she has [facility] for her home health. Short of breath, breathing machines on backorder.  Dyspnea:  room to  room = MMRC3 = can't walk 100 yards even at a slow pace at a flat grade s stopping due to  sob s 02 and says doesn't have a good portable system from [employer]  Cough: dry worse p supper and all night long   Sleep: on side flat bed 2 pillows / cpap x years from [facility]  SABA use: not helpful on advair 500 and stiolto once a day   No  obvious patterns in day to day or daytime variability or assoc excess/ purulent sputum or mucus plugs or hemoptysis or cp or chest tightness, subjective wheeze or overt   hb symptoms.   Also denies any obvious fluctuation of symptoms with weather or environmental changes or other aggravating or alleviating factors except as outlined above   No unusual exposure hx or h/o childhood pna/ asthma or knowledge of premature birth.  Current Allergies, Complete Past Medical History, Past Surgical History, Family History, and Social History were reviewed in [employer] record.  ROS  The following are not active complaints unless bolded Hoarseness, sore throat, dysphagia, dental problems, itching, sneezing,  nasal congestion or discharge of excess mucus or purulent secretions, ear ache,   fever, chills, sweats, unintended wt loss or wt gain, classically pleuritic or exertional cp,  orthopnea pnd or arm/hand swelling  or leg swelling, presyncope, palpitations, abdominal pain, anorexia, nausea, vomiting, diarrhea  or change in bowel habits or change in bladder habits, change in stools or change in urine, dysuria, hematuria,  rash, arthralgias, visual complaints, headache, numbness, weakness or ataxia or problems with walking or coordination,  change in mood or  memory.           Past Medical History:  Diagnosis Date   Anxiety    Asthma    CHF (congestive heart failure) (HCC)    COPD (chronic obstructive pulmonary disease) ([place])    Diabetes mellitus without complication ([place])    Hypertension    Thyroid disease     Outpatient Medications Prior to Visit  Medication Sig Dispense Refill   ADVAIR DISKUS 500-50 MCG/ACT AEPB Inhale 1 puff into the lungs 2 (two) times daily.     atorvastatin (LIPITOR) 20 MG tablet Take 20 mg by mouth at bedtime.      bethanechol (URECHOLINE) 5 MG tablet Take 5 mg by mouth 2 (two) times daily.     clobetasol cream (TEMOVATE) 0.05 % Apply topically. Apply  as directed.     cyclobenzaprine (FLEXERIL) 5 MG tablet Take 5 mg by mouth 3 (three) times daily.     escitalopram (LEXAPRO) 10 MG tablet Take 1 tablet by mouth daily.     folic acid (FOLVITE) 1 MG tablet Take 1 tablet (1 mg total) by mouth daily.     furosemide (LASIX) 40 MG tablet Take 1 tablet (40 mg total) by mouth daily. 30 tablet 1   levothyroxine (SYNTHROID) 200 MCG tablet Take 1 tablet (200 mcg total) by mouth daily at 6 (six) AM. 30 tablet 0   liothyronine (CYTOMEL) 25 MCG tablet Take 25 mcg by mouth daily.     loratadine (CLARITIN) 10 MG tablet Take 10 mg by mouth daily.     metoprolol tartrate (LOPRESSOR) 25 MG tablet Take 0.5 tablets (12.5 mg total) by mouth 2 (two) times daily. 60 tablet 1   oxyCODONE-acetaminophen (PERCOCET) 10-325 MG tablet Take 1 tablet by mouth 4 (four) times daily as needed for pain.     pantoprazole (PROTONIX) 40 MG tablet Take 1 tablet (40 mg total) by mouth daily. 30 tablet 0   STIOLTO RESPIMAT 2.5-2.5 MCG/ACT AERS Inhale 2 each into the  lungs. 2 puffs as needed.     topiramate (TOPAMAX) 100 MG tablet Take 100 mg by mouth 2 (two) times daily.     Vitamin D, Ergocalciferol, (DRISDOL) 50000 units CAPS capsule TAKE ONE CAPSULE BY MOUTH ONCE A WEEK 12 capsule 0   XARELTO 20 MG TABS tablet Take 20 mg by mouth daily.     bethanechol (URECHOLINE) 5 MG tablet Take 5 mg by mouth daily.         0       0      Objective:     BP 104/60 (BP Location: Left Arm, Patient Position: Sitting, Cuff Size: Large)   Pulse 84   Temp 97.7 F (36.5 C) (Temporal)   Ht 5\' 9"  (1.753 m)   Wt 265 lb (120.2 kg)   SpO2 96% Comment: 2 liters  BMI 39.13 kg/m   SpO2: 96 % (2 liters) O2 Type: Continuous O2  A,b wf gruff voice /smoker's rattle   HEENT : pt wearing mask not removed for exam due to covid -19 concerns.    NECK :  without JVD/Nodes/TM/ nl carotid upstrokes bilaterally   LUNGS: no acc muscle use,  Mod barrel  contour chest wall with bilateral  Distant bs s  audible wheeze and  without cough on insp or exp maneuvers and mod  Hyperresonant  to  percussion bilaterally     CV:  RRR  no s3 or murmur or increase in P2, and no edema   ABD:  soft and nontender with pos mid insp Hoover's  in the supine position. No bruits or organomegaly appreciated, bowel sounds nl  MS:     ext warm without deformities, calf tenderness, cyanosis or clubbing No obvious joint restrictions   SKIN: warm and dry without lesions    NEURO:  alert, approp, nl sensorium with  no motor or cerebellar deficits apparent.           Assessment   COPD GOLD ? / still smoking Active smoker - 06/23/2020  After extensive coaching inhaler device,  effectiveness =    75% (short Ti) continue stiolto 2 pffs each am and stop advair  Pt is Group B in terms of symptom/risk and laba/lama therefore appropriate rx at this point >>>  Max dose stiolto and hope to reduce/ eliminate smoking at this point  Re saba: I spent extra time with pt today reviewing appropriate use of albuterol for prn use on exertion with the following points: 1) saba is for relief of sob that does not improve by walking a slower pace or resting but rather if the pt does not improve after trying this first. 2) If the pt is convinced, as many are, that saba helps recover from activity faster then it's easy to tell if this is the case by re-challenging : ie stop, take the inhaler, then p 5 minutes try the exact same activity (intensity of workload) that just caused the symptoms and see if they are substantially diminished or not after saba 3) if there is an activity that reproducibly causes the symptoms, try the saba 15 min before the activity on alternate days   If in fact the saba really does help, then fine to continue to use it prn but advised may need to look closer at the maintenance regimen being used to achieve better control of airways disease with exertion.       Chronic respiratory failure with hypoxia and  hypercapnia ([place]) HC03   10/10/2019   =  41  -  06/23/2020   Walked RA  approx   200 ft  @ moderate pace  stopped due to  Sob/leg pain  on 2lpm but sats still 91%    Advised on goal of keeping sats > 90%   >>> Make sure you check your oxygen saturation  at your highest level of activity  to be sure it stays over 90% and adjust  02 flow upward to maintain this level if needed but remember to turn it back to previous settings when you stop (to conserve your supply).    F/u with pft's in 6 weeks and then set up sleep medicine f/u as previously rec  Each maintenance medication was reviewed in detail including emphasizing most importantly the difference between maintenance and prns and under what circumstances the prns are to be triggered using an action plan format where appropriate.  Total time for H and P, chart review, counseling, reviewing smi/02 device(s) , directly observing portions of ambulatory 02 saturation study/ and generating customized AVS unique to this office visit / same day charting  >  55 min             CIGARETTE SMO[employer]ER 4-5 min discussion re active cigarette smoking in addition to office E&M  Ask about tobacco use:   ongoing Advise quitting   I took an extended  opportunity with this patient to outline the consequences of continued cigarette use  in airway disorders based on all the data we have from the multiple national lung health studies (perfomed over decades at millions of dollars in cost)  indicating that smoking cessation, not choice of inhalers or physicians, is the most important aspect of her care.   Assess willingness:  Not committed at this point Assist in quit attempt:  Per PCP when ready Arrange follow up:   Follow up per Primary Care planned            Christinia Gully, MD 06/23/2020

## 2020-06-23 NOTE — Assessment & Plan Note (Signed)

## 2020-06-23 NOTE — Assessment & Plan Note (Signed)
Active smoker - 06/23/2020  After extensive coaching inhaler device,  effectiveness =    75% (short Ti) continue stiolto 2 pffs each am and stop advair  Pt is Group B in terms of symptom/risk and laba/lama therefore appropriate rx at this point >>>  Max dose stiolto and hope to reduce/ eliminate smoking at this point  Re saba: I spent extra time with pt today reviewing appropriate use of albuterol for prn use on exertion with the following points: 1) saba is for relief of sob that does not improve by walking a slower pace or resting but rather if the pt does not improve after trying this first. 2) If the pt is convinced, as many are, that saba helps recover from activity faster then it's easy to tell if this is the case by re-challenging : ie stop, take the inhaler, then p 5 minutes try the exact same activity (intensity of workload) that just caused the symptoms and see if they are substantially diminished or not after saba 3) if there is an activity that reproducibly causes the symptoms, try the saba 15 min before the activity on alternate days   If in fact the saba really does help, then fine to continue to use it prn but advised may need to look closer at the maintenance regimen being used to achieve better control of airways disease with exertion.

## 2020-06-28 ENCOUNTER — Telehealth: Payer: Self-pay | Admitting: *Deleted

## 2020-06-28 NOTE — Telephone Encounter (Signed)
[facility] to inquire if they could fill insurance preference for Albuterol inhaler.  Insurance preference is [employer], pharmacy has already run the [employer] and filled.  No PA needed.

## 2020-08-02 ENCOUNTER — Other Ambulatory Visit (HOSPITAL_COMMUNITY)
Admission: RE | Admit: 2020-08-02 | Discharge: 2020-08-02 | Disposition: A | Discharge status: Home / Self Care | Payer: Medicaid Other | Source: Ambulatory Visit | Attending: Internal Medicine | Admitting: Internal Medicine

## 2020-08-04 ENCOUNTER — Encounter (HOSPITAL_COMMUNITY): Payer: Medicaid Other

## 2020-08-08 ENCOUNTER — Ambulatory Visit: Payer: Medicaid Other | Admitting: Internal Medicine

## 2021-03-16 ENCOUNTER — Inpatient Hospital Stay (HOSPITAL_COMMUNITY)
Admission: EM | Admit: 2021-03-16 | Discharge: 2021-03-21 | DRG: 189 | Disposition: A | Discharge status: Home Health | Payer: Medicaid Other | Attending: Family Medicine | Admitting: Family Medicine

## 2021-03-16 ENCOUNTER — Encounter (HOSPITAL_COMMUNITY): Payer: Self-pay

## 2021-03-16 ENCOUNTER — Other Ambulatory Visit: Payer: Self-pay

## 2021-03-16 ENCOUNTER — Emergency Department (HOSPITAL_COMMUNITY): Payer: Medicaid Other

## 2021-03-16 DIAGNOSIS — F17200 Nicotine dependence, unspecified, uncomplicated: Secondary | ICD-10-CM | POA: Diagnosis not present

## 2021-03-16 DIAGNOSIS — D229 Melanocytic nevi, unspecified: Secondary | ICD-10-CM | POA: Diagnosis present

## 2021-03-16 DIAGNOSIS — J9602 Acute respiratory failure with hypercapnia: Principal | ICD-10-CM | POA: Diagnosis present

## 2021-03-16 DIAGNOSIS — J9622 Acute and chronic respiratory failure with hypercapnia: Secondary | ICD-10-CM | POA: Diagnosis present

## 2021-03-16 DIAGNOSIS — Z8701 Personal history of pneumonia (recurrent): Secondary | ICD-10-CM

## 2021-03-16 DIAGNOSIS — Z8616 Personal history of COVID-19: Secondary | ICD-10-CM | POA: Diagnosis not present

## 2021-03-16 DIAGNOSIS — Z79890 Hormone replacement therapy (postmenopausal): Secondary | ICD-10-CM | POA: Diagnosis not present

## 2021-03-16 DIAGNOSIS — Z9981 Dependence on supplemental oxygen: Secondary | ICD-10-CM | POA: Diagnosis not present

## 2021-03-16 DIAGNOSIS — Z7901 Long term (current) use of anticoagulants: Secondary | ICD-10-CM | POA: Diagnosis not present

## 2021-03-16 DIAGNOSIS — Z7951 Long term (current) use of inhaled steroids: Secondary | ICD-10-CM | POA: Diagnosis not present

## 2021-03-16 DIAGNOSIS — I5032 Chronic diastolic (congestive) heart failure: Secondary | ICD-10-CM | POA: Diagnosis not present

## 2021-03-16 DIAGNOSIS — E785 Hyperlipidemia, unspecified: Secondary | ICD-10-CM | POA: Diagnosis present

## 2021-03-16 DIAGNOSIS — E8729 Other acidosis: Secondary | ICD-10-CM | POA: Diagnosis present

## 2021-03-16 DIAGNOSIS — I110 Hypertensive heart disease with heart failure: Secondary | ICD-10-CM | POA: Diagnosis present

## 2021-03-16 DIAGNOSIS — E039 Hypothyroidism, unspecified: Secondary | ICD-10-CM | POA: Diagnosis present

## 2021-03-16 DIAGNOSIS — J438 Other emphysema: Secondary | ICD-10-CM | POA: Diagnosis present

## 2021-03-16 DIAGNOSIS — Z79899 Other long term (current) drug therapy: Secondary | ICD-10-CM | POA: Diagnosis not present

## 2021-03-16 DIAGNOSIS — D225 Melanocytic nevi of trunk: Secondary | ICD-10-CM | POA: Diagnosis present

## 2021-03-16 DIAGNOSIS — F17210 Nicotine dependence, cigarettes, uncomplicated: Secondary | ICD-10-CM | POA: Diagnosis present

## 2021-03-16 DIAGNOSIS — J9601 Acute respiratory failure with hypoxia: Secondary | ICD-10-CM | POA: Diagnosis not present

## 2021-03-16 DIAGNOSIS — Z720 Tobacco use: Secondary | ICD-10-CM | POA: Diagnosis present

## 2021-03-16 DIAGNOSIS — I10 Essential (primary) hypertension: Secondary | ICD-10-CM | POA: Diagnosis present

## 2021-03-16 DIAGNOSIS — T68XXXS Hypothermia, sequela: Secondary | ICD-10-CM

## 2021-03-16 DIAGNOSIS — Z6833 Body mass index (BMI) 33.0-33.9, adult: Secondary | ICD-10-CM

## 2021-03-16 DIAGNOSIS — E119 Type 2 diabetes mellitus without complications: Secondary | ICD-10-CM | POA: Diagnosis present

## 2021-03-16 DIAGNOSIS — J441 Chronic obstructive pulmonary disease with (acute) exacerbation: Secondary | ICD-10-CM | POA: Diagnosis present

## 2021-03-16 DIAGNOSIS — I480 Paroxysmal atrial fibrillation: Secondary | ICD-10-CM | POA: Diagnosis present

## 2021-03-16 DIAGNOSIS — T68XXXA Hypothermia, initial encounter: Secondary | ICD-10-CM | POA: Insufficient documentation

## 2021-03-16 DIAGNOSIS — E669 Obesity, unspecified: Secondary | ICD-10-CM | POA: Diagnosis present

## 2021-03-16 DIAGNOSIS — G4733 Obstructive sleep apnea (adult) (pediatric): Secondary | ICD-10-CM | POA: Diagnosis present

## 2021-03-16 DIAGNOSIS — E782 Mixed hyperlipidemia: Secondary | ICD-10-CM | POA: Diagnosis not present

## 2021-03-16 DIAGNOSIS — J439 Emphysema, unspecified: Secondary | ICD-10-CM | POA: Diagnosis present

## 2021-03-16 DIAGNOSIS — J9621 Acute and chronic respiratory failure with hypoxia: Secondary | ICD-10-CM | POA: Diagnosis present

## 2021-03-16 LAB — COMPREHENSIVE METABOLIC PANEL
ALT: 11 U/L (ref 0–44)
AST: 19 U/L (ref 15–41)
Albumin: 3.7 g/dL (ref 3.5–5.0)
Alkaline Phosphatase: 145 U/L — ABNORMAL HIGH (ref 38–126)
Anion gap: 8 (ref 5–15)
BUN: 21 mg/dL (ref 8–23)
CO2: 40 mmol/L — ABNORMAL HIGH (ref 22–32)
Calcium: 9.5 mg/dL (ref 8.9–10.3)
Chloride: 93 mmol/L — ABNORMAL LOW (ref 98–111)
Creatinine, Ser: 0.78 mg/dL (ref 0.44–1.00)
GFR, Estimated: >60 mL/min (ref >60)
Glucose, Bld: 217 mg/dL — ABNORMAL HIGH (ref 70–99)
Potassium: 4.4 mmol/L (ref 3.5–5.1)
Sodium: 141 mmol/L (ref 135–145)
Total Bilirubin: 0.5 mg/dL (ref 0.3–1.2)
Total Protein: 7.9 g/dL (ref 6.5–8.1)

## 2021-03-16 LAB — URINALYSIS, ROUTINE W REFLEX MICROSCOPIC
Bilirubin Urine: NEGATIVE
Glucose, UA: NEGATIVE mg/dL
Ketones, ur: NEGATIVE mg/dL
Nitrite: NEGATIVE
Protein, ur: >=300 mg/dL — AB
Specific Gravity, Urine: 1.024 (ref 1.005–1.030)
pH: 5 (ref 5.0–8.0)

## 2021-03-16 LAB — BLOOD GAS, ARTERIAL
Acid-Base Excess: 10.5 mmol/L — ABNORMAL HIGH (ref 0.0–2.0)
Acid-Base Excess: 13.8 mmol/L — ABNORMAL HIGH (ref 0.0–2.0)
Acid-Base Excess: 15.3 mmol/L — ABNORMAL HIGH (ref 0.0–2.0)
Bicarbonate: 42.6 mmol/L — ABNORMAL HIGH (ref 20.0–28.0)
Bicarbonate: 45 mmol/L — ABNORMAL HIGH (ref 20.0–28.0)
Bicarbonate: 45.3 mmol/L — ABNORMAL HIGH (ref 20.0–28.0)
Drawn by: 23430
Drawn by: 23430
Drawn by: 35043
FIO2: 40 %
FIO2: 40 %
FIO2: 35 %
O2 Saturation: 97.9 %
O2 Saturation: 98.8 %
O2 Saturation: 92.3 %
Patient temperature: 35.7
Patient temperature: 36.7
Patient temperature: 37.3
pCO2 arterial: 98 mmHg (ref 32–48)
pCO2 arterial: 97 mmHg (ref 32–48)
pCO2 arterial: 85 mmHg (ref 32–48)
pH, Arterial: 7.24 — ABNORMAL LOW (ref 7.35–7.45)
pH, Arterial: 7.27 — ABNORMAL LOW (ref 7.35–7.45)
pH, Arterial: 7.34 — ABNORMAL LOW (ref 7.35–7.45)
pO2, Arterial: 81 mmHg — ABNORMAL LOW (ref 83–108)
pO2, Arterial: 80 mmHg — ABNORMAL LOW (ref 83–108)
pO2, Arterial: 52 mmHg — ABNORMAL LOW (ref 83–108)

## 2021-03-16 LAB — PROTIME-INR
INR: 1 (ref 0.8–1.2)
Prothrombin Time: 13 seconds (ref 11.4–15.2)

## 2021-03-16 LAB — APTT: aPTT: 30 seconds (ref 24–36)

## 2021-03-16 LAB — CBC WITH DIFFERENTIAL/PLATELET
Abs Immature Granulocytes: 0.12 10*3/uL — ABNORMAL HIGH (ref 0.00–0.07)
Basophils Absolute: 0.1 10*3/uL (ref 0.0–0.1)
Basophils Relative: 1 %
Eosinophils Absolute: 0 10*3/uL (ref 0.0–0.5)
Eosinophils Relative: 0 %
HCT: 47.1 % — ABNORMAL HIGH (ref 36.0–46.0)
Hemoglobin: 13.7 g/dL (ref 12.0–15.0)
Immature Granulocytes: 1 %
Lymphocytes Relative: 17 %
Lymphs Abs: 1.8 10*3/uL (ref 0.7–4.0)
MCH: 32.7 pg (ref 26.0–34.0)
MCHC: 29.1 g/dL — ABNORMAL LOW (ref 30.0–36.0)
MCV: 112.4 fL — ABNORMAL HIGH (ref 80.0–100.0)
Monocytes Absolute: 0.7 10*3/uL (ref 0.1–1.0)
Monocytes Relative: 7 %
Neutro Abs: 7.8 10*3/uL — ABNORMAL HIGH (ref 1.7–7.7)
Neutrophils Relative %: 74 %
Platelets: 177 10*3/uL (ref 150–400)
RBC: 4.19 MIL/uL (ref 3.87–5.11)
RDW: 12.7 % (ref 11.5–15.5)
WBC: 10.4 10*3/uL (ref 4.0–10.5)
nRBC: 0.2 % (ref 0.0–0.2)

## 2021-03-16 LAB — RESP PANEL BY RT-PCR (FLU A&B, COVID) ARPGX2
Influenza A by PCR: NEGATIVE
Influenza B by PCR: NEGATIVE
SARS Coronavirus 2 by RT PCR: NEGATIVE

## 2021-03-16 LAB — LACTIC ACID, PLASMA
Lactic Acid, Venous: 1.1 mmol/L (ref 0.5–1.9)
Lactic Acid, Venous: 1.1 mmol/L (ref 0.5–1.9)

## 2021-03-16 LAB — TROPONIN I (HIGH SENSITIVITY)
Troponin I (High Sensitivity): 14 ng/L (ref <18)
Troponin I (High Sensitivity): 41 ng/L — ABNORMAL HIGH (ref <18)

## 2021-03-16 LAB — BRAIN NATRIURETIC PEPTIDE: B Natriuretic Peptide: 141 pg/mL — ABNORMAL HIGH (ref 0.0–100.0)

## 2021-03-16 MED ORDER — PREDNISONE 20 MG PO TABS: 40.0000 mg | ORAL_TABLET | Freq: Every day | ORAL | Status: DC

## 2021-03-16 MED ORDER — ARFORMOTEROL TARTRATE 15 MCG/2ML IN NEBU
15.0000 ug | INHALATION_SOLUTION | Freq: Two times a day (BID) | RESPIRATORY_TRACT | Status: DC
  Administered 2021-03-16 – 2021-03-17 (×2): 15 ug via RESPIRATORY_TRACT
  Filled 2021-03-16: qty 2

## 2021-03-16 MED ORDER — ALBUTEROL SULFATE (2.5 MG/3ML) 0.083% IN NEBU
2.5000 mg | INHALATION_SOLUTION | RESPIRATORY_TRACT | Status: DC | PRN
  Administered 2021-03-18 – 2021-03-20 (×4): 2.5 mg via RESPIRATORY_TRACT
  Filled 2021-03-16 (×4): qty 3

## 2021-03-16 MED ORDER — SODIUM CHLORIDE 0.9 % IV SOLN
2.0000 g | INTRAVENOUS | Status: DC
  Administered 2021-03-17 – 2021-03-20 (×4): 2 g via INTRAVENOUS
  Filled 2021-03-16 (×4): qty 20

## 2021-03-16 MED ORDER — SODIUM CHLORIDE 0.9 % IV SOLN
500.0000 mg | Freq: Once | INTRAVENOUS | Status: AC
  Administered 2021-03-16: 500 mg via INTRAVENOUS
  Filled 2021-03-16: qty 5

## 2021-03-16 MED ORDER — DIGOXIN 0.25 MG/ML IJ SOLN
0.1250 mg | Freq: Once | INTRAMUSCULAR | Status: AC
  Administered 2021-03-16: 0.125 mg via INTRAVENOUS
  Filled 2021-03-16: qty 2

## 2021-03-16 MED ORDER — IPRATROPIUM-ALBUTEROL 0.5-2.5 (3) MG/3ML IN SOLN
RESPIRATORY_TRACT | Status: AC
  Filled 2021-03-16: qty 3

## 2021-03-16 MED ORDER — SODIUM CHLORIDE 0.9 % IV SOLN
2.0000 g | Freq: Once | INTRAVENOUS | Status: AC
  Administered 2021-03-16: 2 g via INTRAVENOUS
  Filled 2021-03-16: qty 20

## 2021-03-16 MED ORDER — METHYLPREDNISOLONE SODIUM SUCC 125 MG IJ SOLR
125.0000 mg | Freq: Four times a day (QID) | INTRAMUSCULAR | Status: DC
  Administered 2021-03-16 – 2021-03-17 (×4): 125 mg via INTRAVENOUS
  Filled 2021-03-16 (×4): qty 2

## 2021-03-16 MED ORDER — SODIUM CHLORIDE 0.9 % IV SOLN: INTRAVENOUS | Status: DC

## 2021-03-16 MED ORDER — ALBUTEROL SULFATE (2.5 MG/3ML) 0.083% IN NEBU
INHALATION_SOLUTION | RESPIRATORY_TRACT | Status: AC
  Administered 2021-03-16: 2.5 mg via RESPIRATORY_TRACT
  Filled 2021-03-16: qty 3

## 2021-03-16 MED ORDER — ALBUTEROL SULFATE (2.5 MG/3ML) 0.083% IN NEBU: 2.5000 mg | INHALATION_SOLUTION | Freq: Once | RESPIRATORY_TRACT | Status: AC

## 2021-03-16 MED ORDER — IPRATROPIUM-ALBUTEROL 0.5-2.5 (3) MG/3ML IN SOLN: 3.0000 mL | Freq: Once | RESPIRATORY_TRACT | Status: AC

## 2021-03-16 MED ORDER — IPRATROPIUM-ALBUTEROL 0.5-2.5 (3) MG/3ML IN SOLN
3.0000 mL | RESPIRATORY_TRACT | Status: DC
  Administered 2021-03-16 – 2021-03-17 (×6): 3 mL via RESPIRATORY_TRACT
  Filled 2021-03-16 (×3): qty 3
  Filled 2021-03-16: qty 6
  Filled 2021-03-16: qty 3

## 2021-03-16 MED ORDER — ACETAMINOPHEN 650 MG RE SUPP: 650.0000 mg | Freq: Four times a day (QID) | RECTAL | Status: DC | PRN

## 2021-03-16 MED ORDER — NALOXONE HCL 2 MG/2ML IJ SOSY
1.0000 mg | PREFILLED_SYRINGE | Freq: Once | INTRAMUSCULAR | Status: AC
  Administered 2021-03-16: 1 mg via INTRAVENOUS
  Filled 2021-03-16: qty 2

## 2021-03-16 MED ORDER — ONDANSETRON HCL 4 MG/2ML IJ SOLN: 4.0000 mg | Freq: Four times a day (QID) | INTRAMUSCULAR | Status: DC | PRN

## 2021-03-16 MED ORDER — MAGNESIUM SULFATE 2 GM/50ML IV SOLN
2.0000 g | Freq: Once | INTRAVENOUS | Status: AC
Start: 2021-03-16 — End: 2021-03-16
  Administered 2021-03-16: 2 g via INTRAVENOUS
  Filled 2021-03-16: qty 50

## 2021-03-16 MED ORDER — ONDANSETRON HCL 4 MG PO TABS: 4.0000 mg | ORAL_TABLET | Freq: Four times a day (QID) | ORAL | Status: DC | PRN

## 2021-03-16 MED ORDER — PANTOPRAZOLE SODIUM 40 MG IV SOLR
40.0000 mg | INTRAVENOUS | Status: DC
Start: 2021-03-16 — End: 2021-03-17
  Administered 2021-03-16: 40 mg via INTRAVENOUS
  Filled 2021-03-16: qty 10

## 2021-03-16 MED ORDER — ACETAMINOPHEN 325 MG PO TABS: 650.0000 mg | ORAL_TABLET | Freq: Four times a day (QID) | ORAL | Status: DC | PRN

## 2021-03-16 MED ORDER — IPRATROPIUM BROMIDE 0.02 % IN SOLN: 0.5000 mg | Freq: Four times a day (QID) | RESPIRATORY_TRACT | Status: DC

## 2021-03-16 MED ORDER — METHYLPREDNISOLONE SODIUM SUCC 125 MG IJ SOLR: 80.0000 mg | Freq: Two times a day (BID) | INTRAMUSCULAR | Status: DC

## 2021-03-16 MED ORDER — IPRATROPIUM-ALBUTEROL 0.5-2.5 (3) MG/3ML IN SOLN
RESPIRATORY_TRACT | Status: AC
  Administered 2021-03-16: 3 mL via RESPIRATORY_TRACT
  Filled 2021-03-16: qty 3

## 2021-03-16 NOTE — Progress Notes (Addendum)
Patient had desat episode.  Took Bipap up to 35% FIO2.  Sat now 92%. Swabbed patient's mouth and changed mask from large to medium.  Large was too big and making seal impossible. ?

## 2021-03-16 NOTE — Progress Notes (Signed)
Patient moved on Bipap from [place] to [place] without incident ?

## 2021-03-16 NOTE — Consult Note (Signed)
? ?NAME:  SEBASTIAN DZIK, MRN:  [id number], DOB:  March 23, 1960, LOS: 0 ?ADMISSION DATE:  03/16/2021, CONSULTATION DATE:  3/16 ?REFERRING MD:  [place]/ [facility] ER , CHIEF COMPLAINT:  acute resp distress   ? ?History of Present Illness:  ?72 yowf active smoker    with medical history significant for COPD, asthma with chronic respiratory failure on home oxygen, OSA, DM, hypertension reported to have with progressive dyspnea, cough,  was extremely short of breath when paramedics arrived, she received DuoNebs   with magnesium and steroids, without much improvement, so she was brought to ED, patient herself denied any fever, chills or any COVID exposures, she denies any chest pain, reports she is still smoking 1 pack lasts her 3 to 4 days.. ?-In ED patient was significantly dyspneic, hypoxic where she was started on BiPAP, ABG after 20 minutes on BiPAP showing pH of 7.24, PCO2 of 98, and PO2 of 81, BNP  141 and  patient report feeling better after brief use of BiPAP and IV steroids,   [facility] hospitalist consulted to admit. ? ?PCCM asked to see in ER on BiPAP ? Will need ET - not able to give much hx though doe at baseline appears to be with adl's/ across the room. ? ?Seen 06/23/20 p covid pna with doe room to room = MMRC3 = can't walk 100 yards even at a slow pace at a flat grade s stopping due to sob. On cpap from [facility]. On 2lpm with sats 912% on 2lpm with ambulation and rec stiolo and pfts but did not return for f/u  ?  ? ?Pertinent  Medical History  ?Anxiety ?Asthma ?CHF ?COPD - no pfts on file  ?Diabetes mellitus without complication ?Hypertension not on acei ?Thyroid disease.  ? ?Significant Hospital Events: ?Including procedures, antibiotic start and stop dates in addition to other pertinent events   ?BC x 2  3/16 >>>  ?Bipap 3/16 >>>  ?Zmax 3/16 >>> ?CTX  3/16 >>>  ? ?Interim History / Subjective:  ?More awake than prior to bipap with prominent upper airway wheeze  ? ?Objective   ?Blood pressure 109/71, pulse (!)  115, temperature 97.7 ?F (36.5 ?C), resp. rate (!) 21, height '5\' 9"'$  (1.753 m), weight 104.3 kg, SpO2 (!) 89 %. ?   ?FiO2 (%):  [45 %] 45 %  ? ?Intake/Output Summary (Last 24 hours) at 03/16/2021 1611 ?Last data filed at 03/16/2021 1532 ?Gross per 24 hour  ?Intake 101.47 ml  ?Output --  ?Net 101.47 ml  ? ?Filed Weights  ? 03/16/21 1418  ?Weight: 104.3 kg  ? ? ?Examination: ? Tmax  97.7  ?General appearance:    much older than state aged/ bipap dep in ER 30 degrees hob  ?At Rest 02 sats  92% on bipap 0.45 FIO2 with 20/10 I/E and vt around 350   ?No jvd ?Oropharynx covered with bipap mask ?Neck supple ?Lungs with coarse upper airway wheezing   ?RRR no s3 or or sign murmur ?Abd obese but soft  with limted insp excursion  ?Extr warm with no edema or clubbing noted ?Neuro  Sensorium sleepy ,  no apparent motor deficits  ? ?  ?I personally reviewed images and agree with radiology impression as follows:  ?CXR:   portable 3/16 ?There are no signs of alveolar pulmonary edema or focal pulmonary ?consolidation. ?There is prominence of interstitial markings in the lower lung ?fields which may suggest scarring or mild interstitial edema or ?interstitial pneumonia. ? ?Assessment & Plan:  ?  1) Acute on chronic resp failure hypoxemic and hypercarbic  with both upper and lower airflow obst apparent ? From prior ET?  Though last admit in 10/2019 did not require ET with similar presentation and she has large buffering capacity so re leave on bipap, recheck abgs one hour and ? Et needed (discussed with EDP ? ?2) severe copd/ still smoking/ doubt adherence/ 02 and cpap dep  ? ?Best Practice (right click and "Reselect all SmartList Selections" daily)  ?Per [facility]  ? ?Labs   ?CBC: ?Recent Labs  ?Lab 03/16/21 ?1423  ?WBC 10.4  ?NEUTROABS 7.8*  ?HGB 13.7  ?HCT 47.1*  ?MCV 112.4*  ?PLT 177  ? ? ?Basic Metabolic Panel: ?Recent Labs  ?Lab 03/16/21 ?1423  ?NA 141  ?K 4.4  ?CL 93*  ?CO2 40*  ?GLUCOSE 217*  ?BUN 21  ?CREATININE 0.78  ?CALCIUM 9.5   ? ?GFR: ?Estimated Creatinine Clearance: 93.7 mL/min (by C-G formula based on SCr of 0.78 mg/dL). ?Recent Labs  ?Lab 03/16/21 ?1423  ?WBC 10.4  ?LATICACIDVEN 1.1  ? ? ?Liver Function Tests: ?Recent Labs  ?Lab 03/16/21 ?1423  ?AST 19  ?ALT 11  ?ALKPHOS 145*  ?BILITOT 0.5  ?PROT 7.9  ?ALBUMIN 3.7  ? ?No results for input(s): LIPASE, AMYLASE in the last 168 hours. ?No results for input(s): AMMONIA in the last 168 hours. ? ?ABG ?   ?Component Value Date/Time  ? PHART 7.24 (L) 03/16/2021 1447  ? PCO2ART 98 (HH) 03/16/2021 1447  ? PO2ART 81 (L) 03/16/2021 1447  ? HCO3 42.6 (H) 03/16/2021 1447  ? TCO2 40 (H) 06/13/2019 1026  ? ACIDBASEDEF 7.7 (H) 08/09/2019 0748  ? O2SAT 97.9 03/16/2021 1447  ?  ? ?Coagulation Profile: ?Recent Labs  ?Lab 03/16/21 ?1423  ?INR 1.0  ? ? ?Cardiac Enzymes: ?No results for input(s): CKTOTAL, CKMB, CKMBINDEX, TROPONINI in the last 168 hours. ? ?HbA1C: ?Hgb A1c MFr Bld  ?Date/Time Value Ref Range Status  ?10/07/2019 01:27 PM 5.0 4.8 - 5.6 % Final  ?  Comment:  ?  (NOTE) ?Pre diabetes:          5.7%-6.4% ? ?Diabetes:              >6.4% ? ?Glycemic control for   <7.0% ?adults with diabetes ?  ?06/09/2019 01:55 PM 5.1 4.8 - 5.6 % Final  ?  Comment:  ?  (NOTE) ?Pre diabetes:          5.7%-6.4% ?Diabetes:              >6.4% ?Glycemic control for   <7.0% ?adults with diabetes ?  ? ? ?CBG: ?No results for input(s): GLUCAP in the last 168 hours. ? ?  ? ?Past Medical History:  ?She,  has a past medical history of Anxiety, Asthma, CHF (congestive heart failure) ([place]), COPD (chronic obstructive pulmonary disease) ([place]), Diabetes mellitus without complication ([place]), Hypertension, and Thyroid disease.  ? ?Surgical History:  ? ?Past Surgical History:  ?Procedure Laterality Date  ? ABDOMINAL HYSTERECTOMY    ?  ? ?Social History:  ? reports that she has been smoking cigarettes. She has a 10.00 pack-year smoking history. She has never used smokeless tobacco. She reports that she does not drink alcohol and does  not use drugs.  ? ?Family History:  ?Her family history is not on file.  ? ?Allergies ?Allergies  ?Allergen Reactions  ? Estrogens Hives  ? Mustard Seed Hives  ? Strawberry Extract Hives  ?  ? ?Home Medications  ?Prior  to Admission medications   ?Medication Sig Start Date End Date Taking? Authorizing Provider  ?ADVAIR DISKUS 500-50 MCG/ACT AEPB Inhale 1 puff into the lungs 2 (two) times daily. 02/28/21  Yes [provider]  ?albuterol (PROAIR HFA) 108 (90 Base) MCG/ACT inhaler 2 puffs every 4 hours as needed only  if your can't catch your breath 06/23/20  Yes Tanda Rockers, MD  ?albuterol (PROVENTIL) (2.5 MG/3ML) 0.083% nebulizer solution Take 3 mLs (2.5 mg total) by nebulization every 6 (six) hours as needed for wheezing or shortness of breath. 06/23/20  Yes Tanda Rockers, MD  ?amLODipine (NORVASC) 10 MG tablet Take 10 mg by mouth daily. 02/28/21  Yes [provider]  ?atorvastatin (LIPITOR) 20 MG tablet Take 20 mg by mouth at bedtime.    Yes [provider]  ?bethanechol (URECHOLINE) 5 MG tablet Take 5 mg by mouth 2 (two) times daily. 08/06/19  Yes [provider]  ?clobetasol cream (TEMOVATE) 0.05 % Apply topically. Apply as directed. 03/15/20  Yes [provider]  ?cyclobenzaprine (FLEXERIL) 5 MG tablet Take 5 mg by mouth 3 (three) times daily. 02/23/19  Yes [provider]  ?escitalopram (LEXAPRO) 10 MG tablet Take 1 tablet by mouth daily. 05/26/20  Yes [provider]  ?folic acid (FOLVITE) 1 MG tablet Take 1 tablet (1 mg total) by mouth daily. 10/11/19  Yes Tat, Shanon Brow, MD  ?furosemide (LASIX) 40 MG tablet Take 1 tablet (40 mg total) by mouth daily. 10/11/19  Yes Tat, Shanon Brow, MD  ?levothyroxine (SYNTHROID) 200 MCG tablet Take 1 tablet (200 mcg total) by mouth daily at 6 (six) AM. 06/20/19  Yes Swayze, Ava, DO  ?liothyronine (CYTOMEL) 25 MCG tablet Take 25 mcg by mouth daily. 06/16/20  Yes [provider]  ?loratadine (CLARITIN) 10 MG tablet Take  10 mg by mouth daily.   Yes [provider]  ?metoprolol tartrate (LOPRESSOR) 25 MG tablet Take 0.5 tablets (12.5 mg total) by mouth 2 (two) times daily. 10/10/19  Yes Orson Eva, MD  ?oxyCODONE-acetami

## 2021-03-16 NOTE — ED Notes (Signed)
Daughter-in-law [name] updated at this time on patient status and states she will be up here to visit patient tomorrow.  ?

## 2021-03-16 NOTE — ED Triage Notes (Signed)
Patient via EMS due to altered mental status and resp failure. EMS gave 2G magnesium, albuterol, 125 mg solumedrol.  ?

## 2021-03-16 NOTE — ED Notes (Signed)
Respiratory at bedside placing patient on BiPap ?

## 2021-03-16 NOTE — ED Notes (Signed)
Patient asking  for water and educated on the importance of BiPAP ?

## 2021-03-16 NOTE — ED Provider Notes (Signed)
?Terri ?Provider Note ? ? ?CSN: [id number] ?Arrival date & time: 03/16/21  1405 ? ?  ? ?History ? ?Chief Complaint  ?Patient presents with  ? Altered Mental Status  ? ? ?Terri Casey is a 61 y.o. female. ? ?Patient with a history of COPD and congestive heart failure.  She has been wheezing and short of breath for couple days.  When paramedics arrived she was extremely short of breath and they gave her 2 neb treatments along with magnesium and steroids.  Patient did not improve ? ?The history is provided by the patient, the EMS personnel and medical records. No language interpreter was used.  ?Altered Mental Status ?Presenting symptoms: behavior changes   ?Severity:  Moderate ?Most recent episode:  Today ?Episode history:  Continuous ?Timing:  Constant ?Progression:  Worsening ?Chronicity:  Recurrent ?Context: not alcohol use   ? ?  ? ?Home Medications ?Prior to Admission medications   ?Medication Sig Start Date End Date Taking? Authorizing Provider  ?albuterol (PROAIR HFA) 108 (90 Base) MCG/ACT inhaler 2 puffs every 4 hours as needed only  if your can't catch your breath 06/23/20   Tanda Rockers, MD  ?albuterol (PROVENTIL) (2.5 MG/3ML) 0.083% nebulizer solution Take 3 mLs (2.5 mg total) by nebulization every 6 (six) hours as needed for wheezing or shortness of breath. 06/23/20   Tanda Rockers, MD  ?atorvastatin (LIPITOR) 20 MG tablet Take 20 mg by mouth at bedtime.     [provider]  ?bethanechol (URECHOLINE) 5 MG tablet Take 5 mg by mouth 2 (two) times daily. 08/06/19   [provider]  ?clobetasol cream (TEMOVATE) 0.05 % Apply topically. Apply as directed. 03/15/20   [provider]  ?cyclobenzaprine (FLEXERIL) 5 MG tablet Take 5 mg by mouth 3 (three) times daily. 02/23/19   [provider]  ?escitalopram (LEXAPRO) 10 MG tablet Take 1 tablet by mouth daily. 05/26/20   [provider]  ?folic acid (FOLVITE) 1 MG tablet Take 1 tablet (1 mg  total) by mouth daily. 10/11/19   Orson Eva, MD  ?furosemide (LASIX) 40 MG tablet Take 1 tablet (40 mg total) by mouth daily. 10/11/19   Orson Eva, MD  ?levothyroxine (SYNTHROID) 200 MCG tablet Take 1 tablet (200 mcg total) by mouth daily at 6 (six) AM. 06/20/19   Swayze, Ava, DO  ?liothyronine (CYTOMEL) 25 MCG tablet Take 25 mcg by mouth daily. 06/16/20   [provider]  ?loratadine (CLARITIN) 10 MG tablet Take 10 mg by mouth daily.    [provider]  ?metoprolol tartrate (LOPRESSOR) 25 MG tablet Take 0.5 tablets (12.5 mg total) by mouth 2 (two) times daily. 10/10/19   Orson Eva, MD  ?oxyCODONE-acetaminophen (PERCOCET) 10-325 MG tablet Take 1 tablet by mouth 4 (four) times daily as needed for pain. 07/24/19   [provider]  ?pantoprazole (PROTONIX) 40 MG tablet Take 1 tablet (40 mg total) by mouth daily. 06/20/19   Swayze, Ava, DO  ?STIOLTO RESPIMAT 2.5-2.5 MCG/ACT AERS 2 puffs  each am 06/23/20   Tanda Rockers, MD  ?topiramate (TOPAMAX) 100 MG tablet Take 100 mg by mouth 2 (two) times daily. 06/01/20   [provider]  ?Vitamin D, Ergocalciferol, (DRISDOL) 50000 units CAPS capsule TAKE ONE CAPSULE BY MOUTH ONCE A WEEK 10/01/16   Nida, Marella Chimes, MD  ?XARELTO 20 MG TABS tablet Take 20 mg by mouth daily. 05/08/19   [provider]  ?   ? ?Allergies    ?  Estrogens, Mustard seed, and Strawberry extract   ? ?Review of Systems   ?Review of Systems  ?Unable to perform ROS: Mental status change  ? ?Physical Exam ?Updated Vital Signs ?BP 127/84   Pulse (!) 108   Temp (!) 97.2 ?F (36.2 ?C)   Resp (!) 23   Ht '5\' 9"'$  (1.753 m)   Wt 104.3 kg   SpO2 93%   BMI 33.97 kg/m?  ?Physical Exam ?Vitals and nursing note reviewed.  ?Constitutional:   ?   Appearance: She is well-developed.  ?   Comments: Lethargic  ?HENT:  ?   Head: Normocephalic.  ?   Comments: Patient is protecting her airway ?   Nose: Nose normal.  ?Eyes:  ?   General: No scleral icterus. ?   Conjunctiva/sclera:  Conjunctivae normal.  ?Neck:  ?   Thyroid: No thyromegaly.  ?Cardiovascular:  ?   Rate and Rhythm: Normal rate and regular rhythm.  ?   Heart sounds: No murmur heard. ?  No friction rub. No gallop.  ?Pulmonary:  ?   Breath sounds: No stridor. No wheezing or rales.  ?Chest:  ?   Chest wall: No tenderness.  ?Abdominal:  ?   General: There is no distension.  ?   Tenderness: There is no abdominal tenderness. There is no rebound.  ?Musculoskeletal:     ?   General: Normal range of motion.  ?   Cervical back: Neck supple.  ?Lymphadenopathy:  ?   Cervical: No cervical adenopathy.  ?Skin: ?   Findings: No erythema or rash.  ?Neurological:  ?   Motor: No abnormal muscle tone.  ?   Coordination: Coordination normal.  ?   Comments: Oriented to person only  ?Psychiatric:     ?   Behavior: Behavior normal.  ? ? ?ED Results / Procedures / Treatments   ?Labs ?(all labs ordered are listed, but only abnormal results are displayed) ?Labs Reviewed  ?COMPREHENSIVE METABOLIC PANEL - Abnormal; Notable for the following components:  ?    Result Value  ? Chloride 93 (*)   ? CO2 40 (*)   ? Glucose, Bld 217 (*)   ? Alkaline Phosphatase 145 (*)   ? All other components within normal limits  ?CBC WITH DIFFERENTIAL/PLATELET - Abnormal; Notable for the following components:  ? HCT 47.1 (*)   ? MCV 112.4 (*)   ? MCHC 29.1 (*)   ? Neutro Abs 7.8 (*)   ? Abs Immature Granulocytes 0.12 (*)   ? All other components within normal limits  ?BRAIN NATRIURETIC PEPTIDE - Abnormal; Notable for the following components:  ? B Natriuretic Peptide 141.0 (*)   ? All other components within normal limits  ?BLOOD GAS, ARTERIAL - Abnormal; Notable for the following components:  ? pH, Arterial 7.24 (*)   ? pCO2 arterial 98 (*)   ? pO2, Arterial 81 (*)   ? Bicarbonate 42.6 (*)   ? Acid-Base Excess 10.5 (*)   ? All other components within normal limits  ?CULTURE, BLOOD (ROUTINE X 2)  ?CULTURE, BLOOD (ROUTINE X 2)  ?URINE CULTURE  ?LACTIC ACID, PLASMA  ?PROTIME-INR   ?APTT  ?LACTIC ACID, PLASMA  ?URINALYSIS, ROUTINE W REFLEX MICROSCOPIC  ?BLOOD GAS, ARTERIAL  ?TROPONIN I (HIGH SENSITIVITY)  ? ? ?EKG ?None ? ?Radiology ?DG Chest Port 1 View ? ?Result Date: 03/16/2021 ?CLINICAL DATA:  Shortness of breath EXAM: PORTABLE CHEST 1 VIEW COMPARISON:  10/05/2019 FINDINGS: Transverse diameter of heart is increased. There are no  signs of alveolar pulmonary edema or focal pulmonary consolidation. There is slight prominence of interstitial markings in the lower lung fields suggesting possible interstitial pneumonia. Low position of diaphragms may suggest COPD. There is no pleural effusion. Apices are not included in their entirety limiting evaluation for small pneumothorax. IMPRESSION: There are no signs of alveolar pulmonary edema or focal pulmonary consolidation. There is prominence of interstitial markings in the lower lung fields which may suggest scarring or mild interstitial edema or interstitial pneumonia. Electronically Signed   By: Elmer Picker M.D.   On: 03/16/2021 14:36   ? ?Procedures ?Procedures  ? ? ?Medications Ordered in ED ?Medications  ?azithromycin (ZITHROMAX) 500 mg in sodium chloride 0.9 % 250 mL IVPB (500 mg Intravenous New Bag/Given 03/16/21 1455)  ?naloxone [facility]) injection 1 mg (1 mg Intravenous Given 03/16/21 1442)  ?ipratropium-albuterol (DUONEB) 0.5-2.5 (3) MG/3ML nebulizer solution 3 mL (3 mLs Nebulization Given 03/16/21 1429)  ?albuterol (PROVENTIL) (2.5 MG/3ML) 0.083% nebulizer solution 2.5 mg (2.5 mg Nebulization Given 03/16/21 1429)  ?cefTRIAXone (ROCEPHIN) 2 g in sodium chloride 0.9 % 100 mL IVPB (0 g Intravenous Stopped 03/16/21 1532)  ? ? ?ED Course/ Medical Decision Making/ A&P ? CRITICAL CARE ?Performed by: Milton Ferguson ?Total critical care time: 40 minutes ?Critical care time was exclusive of separately billable procedures and treating other patients. ?Critical care was necessary to treat or prevent imminent or life-threatening  deterioration. ?Critical care was time spent personally by me on the following activities: development of treatment plan with patient and/or surrogate as well as nursing, discussions with consultants, evaluation of patient's response to

## 2021-03-16 NOTE — Progress Notes (Signed)
Patient sat was around 93% when I was getting ABG.  RN settled patient back up on bed and patient laid back and sat went up to 96% so I weaned O2 from 35% to 30%. ?

## 2021-03-16 NOTE — ED Notes (Signed)
Patient currently on BiPAP.

## 2021-03-16 NOTE — H&P (Signed)
? ?                                                                                                       [facility] H&P ? ? Patient Demographics:  ? ? Terri Casey, is a 61 y.o. female  MRN: [id number]   DOB - 1960-01-17 ? ?Admit Date - 03/16/2021 ? ?Outpatient Primary MD for the patient is [facility] ? ?Referring MD/NP/PA: Dr Roderic Palau ? ?Patient coming from: home ? ?Chief Complaint  ?Patient presents with  ? Altered Mental Status  ?  ? ? HPI:  ? ? Terri Casey  is a 61 y.o. female,  with medical history significant for COPD, asthma with chronic respiratory failure on home oxygen, OSA, DM, hypertension. ?-Patient with known history of COPD, chronic hypoxic respiratory failure on oxygen, she is still smoking, is lethargic but open eye and provide some history, history was obtained mainly from ED physician, patient with progressive dyspnea, cough, he was extremely short of breath when paramedics arrived, she received DuoNebs treatment, like with magnesium and steroids, without much improvement, so she was brought to ED, patient herself denies any fever, chills or any COVID exposures, she denies any chest pain, reports she is still smoking 1 pack lasts her 3 to 4 days.. ?-In ED patient was significantly dyspneic, hypoxic where she was started on BiPAP, ABG after 20 minutes on BiPAP showing pH of 7.24, PCO2 of 98, and PO2 of 81, BNP mildly elevated at 141, but no scar congestion or imaging, and no edema on physical exam, patient report feeling better after brief use of BiPAP and IV steroids, ED discussed with PCCM who will evaluate the patient as well, [facility] hospitalist consulted to admit. ? ? Review of systems:  ?  ?In addition to the HPI above, ? ?A full 10 point Review of Systems was done, except as stated above, all other Review of Systems were negative. ? ? ?With Past History of the following :  ? ? ?Past Medical History:  ?Diagnosis Date  ? Anxiety   ? Asthma   ? CHF (congestive heart  failure) ([place])   ? COPD (chronic obstructive pulmonary disease) ([place])   ? Diabetes mellitus without complication ([place])   ? Hypertension   ? Thyroid disease   ?   ? ?Past Surgical History:  ?Procedure Laterality Date  ? ABDOMINAL HYSTERECTOMY    ? ? ? ? Social History:  ? ?  ?Social History  ? ?Tobacco Use  ? Smoking status: Every Day  ?  Packs/day: 0.50  ?  Years: 20.00  ?  Pack years: 10.00  ?  Types: Cigarettes  ? Smokeless tobacco: Never  ? Tobacco comments:  ?  Patient reports 10-15 cigs at most.   ?Substance Use Topics  ? Alcohol use: No  ?  ? ? ? Family History :  ? ? History reviewed. No pertinent family history. ? ? ? Home Medications:  ? ?Prior to Admission medications   ?Medication Sig Start Date End Date Taking? Authorizing Provider  ?albuterol (PROAIR  HFA) 108 (90 Base) MCG/ACT inhaler 2 puffs every 4 hours as needed only  if your can't catch your breath 06/23/20  Yes Tanda Rockers, MD  ?albuterol (PROVENTIL) (2.5 MG/3ML) 0.083% nebulizer solution Take 3 mLs (2.5 mg total) by nebulization every 6 (six) hours as needed for wheezing or shortness of breath. 06/23/20  Yes Tanda Rockers, MD  ?atorvastatin (LIPITOR) 20 MG tablet Take 20 mg by mouth at bedtime.    Yes [provider]  ?bethanechol (URECHOLINE) 5 MG tablet Take 5 mg by mouth 2 (two) times daily. 08/06/19  Yes [provider]  ?clobetasol cream (TEMOVATE) 0.05 % Apply topically. Apply as directed. 03/15/20  Yes [provider]  ?cyclobenzaprine (FLEXERIL) 5 MG tablet Take 5 mg by mouth 3 (three) times daily. 02/23/19  Yes [provider]  ?escitalopram (LEXAPRO) 10 MG tablet Take 1 tablet by mouth daily. 05/26/20  Yes [provider]  ?folic acid (FOLVITE) 1 MG tablet Take 1 tablet (1 mg total) by mouth daily. 10/11/19  Yes Tat, Shanon Brow, MD  ?furosemide (LASIX) 40 MG tablet Take 1 tablet (40 mg total) by mouth daily. 10/11/19  Yes Tat, Shanon Brow, MD  ?levothyroxine (SYNTHROID) 200 MCG tablet Take 1 tablet (200  mcg total) by mouth daily at 6 (six) AM. 06/20/19  Yes Swayze, Ava, DO  ?liothyronine (CYTOMEL) 25 MCG tablet Take 25 mcg by mouth daily. 06/16/20  Yes [provider]  ?loratadine (CLARITIN) 10 MG tablet Take 10 mg by mouth daily.   Yes [provider]  ?metoprolol tartrate (LOPRESSOR) 25 MG tablet Take 0.5 tablets (12.5 mg total) by mouth 2 (two) times daily. 10/10/19  Yes Tat, Shanon Brow, MD  ?oxyCODONE-acetaminophen (PERCOCET) 10-325 MG tablet Take 1 tablet by mouth 4 (four) times daily as needed for pain. 07/24/19  Yes [provider]  ?pantoprazole (PROTONIX) 40 MG tablet Take 1 tablet (40 mg total) by mouth daily. 06/20/19  Yes Swayze, Ava, DO  ?STIOLTO RESPIMAT 2.5-2.5 MCG/ACT AERS 2 puffs  each am 06/23/20  Yes Tanda Rockers, MD  ?topiramate (TOPAMAX) 100 MG tablet Take 100 mg by mouth 2 (two) times daily. 06/01/20  Yes [provider]  ?ADVAIR DISKUS 500-50 MCG/ACT AEPB Inhale 1 puff into the lungs 2 (two) times daily. 02/28/21   [provider]  ?Vitamin D, Ergocalciferol, (DRISDOL) 50000 units CAPS capsule TAKE ONE CAPSULE BY MOUTH ONCE A WEEK ?Patient not taking: Reported on 03/16/2021 10/01/16   Cassandria Anger, MD  ?Alveda Reasons 20 MG TABS tablet Take 20 mg by mouth daily. 05/08/19   [provider]  ? ? ? Allergies:  ? ?  ?Allergies  ?Allergen Reactions  ? Estrogens Hives  ? Mustard Seed Hives  ? Strawberry Extract Hives  ? ? ? Physical Exam:  ? ?Vitals ? ?Blood pressure 109/71, pulse (!) 115, temperature 97.7 ?F (36.5 ?C), resp. rate (!) 21, height '5\' 9"'$  (1.753 m), weight 104.3 kg, SpO2 (!) 89 %. ? ? ?1. General frail, ill-appearing female, laying in bed on BiPAP ? ?2.  Lethargic, but opens eyes to verbal stimuli, answer questions appropriately with some delay then go back to sleep ? ?3. No F.N deficits, ALL C.Nerves Intact, moving all extremities with no deficits ? ?4. Ears and Eyes appear Normal, Conjunctivae clear, PERRLA. Moist Oral Mucosa. ? ?5. Supple  Neck, No JVD, No cervical lymphadenopathy appriciated, No Carotid Bruits. ? ?6. Symmetrical Chest wall movement, nester entry bilaterally with wheezing ? ?7.  Tachycardic, No Gallops,  Rubs or Murmurs, No Parasternal Heave. ? ?8. Positive Bowel Sounds, Abdomen Soft, No tenderness, No organomegaly appriciated,No rebound -guarding or rigidity. ? ?9.  No Cyanosis, Normal Skin Turgor, No Skin Rash or Bruise. ? ?10. Good muscle tone,  joints appear normal , no effusions, Normal ROM. ? ?11. No Palpable Lymph Nodes in Neck or Axillae ? ? ? ? Data Review:  ? ? CBC ?Recent Labs  ?Lab 03/16/21 ?1423  ?WBC 10.4  ?HGB 13.7  ?HCT 47.1*  ?PLT 177  ?MCV 112.4*  ?MCH 32.7  ?MCHC 29.1*  ?RDW 12.7  ?LYMPHSABS 1.8  ?MONOABS 0.7  ?EOSABS 0.0  ?BASOSABS 0.1  ? ?------------------------------------------------------------------------------------------------------------------ ? ?Chemistries  ?Recent Labs  ?Lab 03/16/21 ?1423  ?NA 141  ?K 4.4  ?CL 93*  ?CO2 40*  ?GLUCOSE 217*  ?BUN 21  ?CREATININE 0.78  ?CALCIUM 9.5  ?AST 19  ?ALT 11  ?ALKPHOS 145*  ?BILITOT 0.5  ? ?------------------------------------------------------------------------------------------------------------------ ?estimated creatinine clearance is 93.7 mL/min (by C-G formula based on SCr of 0.78 mg/dL). ?------------------------------------------------------------------------------------------------------------------ ?No results for input(s): TSH, T4TOTAL, T3FREE, THYROIDAB in the last 72 hours. ? ?Invalid input(s): FREET3 ? ?Coagulation profile ?Recent Labs  ?Lab 03/16/21 ?1423  ?INR 1.0  ? ?------------------------------------------------------------------------------------------------------------------- ?No results for input(s): DDIMER in the last 72 hours. ?------------------------------------------------------------------------------------------------------------------- ? ?Cardiac Enzymes ?No results for input(s): CKMB, TROPONINI, MYOGLOBIN in the last 168  hours. ? ?Invalid input(s): CK ?------------------------------------------------------------------------------------------------------------------ ?   ?Component Value Date/Time  ? BNP 141.0 (H) 03/16/2021 1410  ? ? ? ?

## 2021-03-16 NOTE — ED Notes (Addendum)
Dr. Waldron Labs at bedside to assess patient and  verbalizes 84% oxygen on BiPAP. When patient is awake 90% on BiPAP ?

## 2021-03-17 ENCOUNTER — Encounter (HOSPITAL_COMMUNITY): Payer: Self-pay | Admitting: Internal Medicine

## 2021-03-17 DIAGNOSIS — J9601 Acute respiratory failure with hypoxia: Secondary | ICD-10-CM | POA: Diagnosis not present

## 2021-03-17 DIAGNOSIS — J9602 Acute respiratory failure with hypercapnia: Secondary | ICD-10-CM | POA: Diagnosis not present

## 2021-03-17 DIAGNOSIS — J441 Chronic obstructive pulmonary disease with (acute) exacerbation: Secondary | ICD-10-CM | POA: Diagnosis not present

## 2021-03-17 DIAGNOSIS — I480 Paroxysmal atrial fibrillation: Secondary | ICD-10-CM | POA: Diagnosis not present

## 2021-03-17 DIAGNOSIS — E782 Mixed hyperlipidemia: Secondary | ICD-10-CM

## 2021-03-17 DIAGNOSIS — I5032 Chronic diastolic (congestive) heart failure: Secondary | ICD-10-CM | POA: Diagnosis not present

## 2021-03-17 LAB — MRSA NEXT GEN BY PCR, NASAL: MRSA by PCR Next Gen: NOT DETECTED

## 2021-03-17 LAB — CBC
HCT: 41.5 % (ref 36.0–46.0)
Hemoglobin: 12 g/dL (ref 12.0–15.0)
MCH: 31.1 pg (ref 26.0–34.0)
MCHC: 28.9 g/dL — ABNORMAL LOW (ref 30.0–36.0)
MCV: 107.5 fL — ABNORMAL HIGH (ref 80.0–100.0)
Platelets: 149 10*3/uL — ABNORMAL LOW (ref 150–400)
RBC: 3.86 MIL/uL — ABNORMAL LOW (ref 3.87–5.11)
RDW: 12.7 % (ref 11.5–15.5)
WBC: 4.9 10*3/uL (ref 4.0–10.5)
nRBC: 0 % (ref 0.0–0.2)

## 2021-03-17 LAB — BASIC METABOLIC PANEL
Anion gap: 7 (ref 5–15)
BUN: 28 mg/dL — ABNORMAL HIGH (ref 8–23)
CO2: 39 mmol/L — ABNORMAL HIGH (ref 22–32)
Calcium: 9.4 mg/dL (ref 8.9–10.3)
Chloride: 96 mmol/L — ABNORMAL LOW (ref 98–111)
Creatinine, Ser: 0.67 mg/dL (ref 0.44–1.00)
GFR, Estimated: >60 mL/min (ref >60)
Glucose, Bld: 128 mg/dL — ABNORMAL HIGH (ref 70–99)
Potassium: 4.5 mmol/L (ref 3.5–5.1)
Sodium: 142 mmol/L (ref 135–145)

## 2021-03-17 LAB — BLOOD GAS, ARTERIAL
Acid-Base Excess: 14.5 mmol/L — ABNORMAL HIGH (ref 0.0–2.0)
Bicarbonate: 42.7 mmol/L — ABNORMAL HIGH (ref 20.0–28.0)
Drawn by: 38235
FIO2: 40 %
O2 Saturation: 95.2 %
Patient temperature: 37
pCO2 arterial: 69 mmHg (ref 32–48)
pH, Arterial: 7.4 (ref 7.35–7.45)
pO2, Arterial: 66 mmHg — ABNORMAL LOW (ref 83–108)

## 2021-03-17 LAB — HIV ANTIBODY (ROUTINE TESTING W REFLEX): HIV Screen 4th Generation wRfx: NONREACTIVE

## 2021-03-17 MED ORDER — FUROSEMIDE 40 MG PO TABS
40.0000 mg | ORAL_TABLET | Freq: Every day | ORAL | Status: DC
  Administered 2021-03-17 – 2021-03-21 (×5): 40 mg via ORAL
  Filled 2021-03-17 (×5): qty 1

## 2021-03-17 MED ORDER — LORATADINE 10 MG PO TABS
10.0000 mg | ORAL_TABLET | Freq: Every day | ORAL | Status: DC
  Administered 2021-03-17 – 2021-03-21 (×5): 10 mg via ORAL
  Filled 2021-03-17 (×5): qty 1

## 2021-03-17 MED ORDER — METHYLPREDNISOLONE SODIUM SUCC 125 MG IJ SOLR
80.0000 mg | Freq: Two times a day (BID) | INTRAMUSCULAR | Status: DC
Start: 2021-03-17 — End: 2021-03-20
  Administered 2021-03-17 – 2021-03-20 (×6): 80 mg via INTRAVENOUS
  Filled 2021-03-17 (×6): qty 2

## 2021-03-17 MED ORDER — RIVAROXABAN 20 MG PO TABS
20.0000 mg | ORAL_TABLET | Freq: Every day | ORAL | Status: DC
  Administered 2021-03-17 – 2021-03-20 (×4): 20 mg via ORAL
  Filled 2021-03-17 (×4): qty 1

## 2021-03-17 MED ORDER — CHLORHEXIDINE GLUCONATE CLOTH 2 % EX PADS
6.0000 | MEDICATED_PAD | Freq: Every day | CUTANEOUS | Status: DC
  Administered 2021-03-17 – 2021-03-20 (×4): 6 via TOPICAL

## 2021-03-17 MED ORDER — METOPROLOL TARTRATE 25 MG PO TABS
12.5000 mg | ORAL_TABLET | Freq: Two times a day (BID) | ORAL | Status: DC
  Administered 2021-03-17 – 2021-03-21 (×8): 12.5 mg via ORAL
  Filled 2021-03-17 (×9): qty 1

## 2021-03-17 MED ORDER — ARFORMOTEROL TARTRATE 15 MCG/2ML IN NEBU
15.0000 ug | INHALATION_SOLUTION | Freq: Two times a day (BID) | RESPIRATORY_TRACT | Status: DC
  Administered 2021-03-17 – 2021-03-21 (×8): 15 ug via RESPIRATORY_TRACT
  Filled 2021-03-17 (×8): qty 2

## 2021-03-17 MED ORDER — BETHANECHOL CHLORIDE 10 MG PO TABS
5.0000 mg | ORAL_TABLET | Freq: Two times a day (BID) | ORAL | Status: DC
  Filled 2021-03-17 (×6): qty 1

## 2021-03-17 MED ORDER — AMLODIPINE BESYLATE 5 MG PO TABS
10.0000 mg | ORAL_TABLET | Freq: Every day | ORAL | Status: DC
  Administered 2021-03-17 – 2021-03-21 (×5): 10 mg via ORAL
  Filled 2021-03-17 (×5): qty 2

## 2021-03-17 MED ORDER — DM-GUAIFENESIN ER 30-600 MG PO TB12
2.0000 | ORAL_TABLET | Freq: Two times a day (BID) | ORAL | Status: DC
Start: 2021-03-17 — End: 2021-03-18
  Administered 2021-03-17 – 2021-03-18 (×3): 2 via ORAL
  Filled 2021-03-17 (×3): qty 2

## 2021-03-17 MED ORDER — CYCLOBENZAPRINE HCL 10 MG PO TABS
5.0000 mg | ORAL_TABLET | Freq: Three times a day (TID) | ORAL | Status: DC | PRN
  Administered 2021-03-17 – 2021-03-18 (×2): 5 mg via ORAL
  Filled 2021-03-17 (×2): qty 1

## 2021-03-17 MED ORDER — PANTOPRAZOLE SODIUM 40 MG PO TBEC
40.0000 mg | DELAYED_RELEASE_TABLET | Freq: Two times a day (BID) | ORAL | Status: DC
Start: 2021-03-17 — End: 2021-03-21
  Administered 2021-03-17 – 2021-03-21 (×8): 40 mg via ORAL
  Filled 2021-03-17 (×8): qty 1

## 2021-03-17 MED ORDER — NICOTINE 21 MG/24HR TD PT24
21.0000 mg | MEDICATED_PATCH | Freq: Every day | TRANSDERMAL | Status: DC
  Administered 2021-03-17 – 2021-03-21 (×5): 21 mg via TRANSDERMAL
  Filled 2021-03-17 (×5): qty 1

## 2021-03-17 MED ORDER — TOPIRAMATE 100 MG PO TABS
100.0000 mg | ORAL_TABLET | Freq: Two times a day (BID) | ORAL | Status: DC
  Administered 2021-03-17 – 2021-03-21 (×9): 100 mg via ORAL
  Filled 2021-03-17 (×9): qty 1

## 2021-03-17 MED ORDER — ATORVASTATIN CALCIUM 20 MG PO TABS
20.0000 mg | ORAL_TABLET | Freq: Every day | ORAL | Status: DC
  Administered 2021-03-17 – 2021-03-20 (×4): 20 mg via ORAL
  Filled 2021-03-17 (×4): qty 1

## 2021-03-17 MED ORDER — REVEFENACIN 175 MCG/3ML IN SOLN
175.0000 ug | Freq: Every day | RESPIRATORY_TRACT | Status: DC
  Administered 2021-03-18 – 2021-03-21 (×4): 175 ug via RESPIRATORY_TRACT
  Filled 2021-03-17 (×4): qty 3

## 2021-03-17 MED ORDER — FOLIC ACID 1 MG PO TABS
1.0000 mg | ORAL_TABLET | Freq: Every day | ORAL | Status: DC
  Administered 2021-03-17 – 2021-03-21 (×5): 1 mg via ORAL
  Filled 2021-03-17 (×5): qty 1

## 2021-03-17 MED ORDER — ESCITALOPRAM OXALATE 10 MG PO TABS
10.0000 mg | ORAL_TABLET | Freq: Every day | ORAL | Status: DC
Start: 2021-03-17 — End: 2021-03-21
  Administered 2021-03-17 – 2021-03-21 (×5): 10 mg via ORAL
  Filled 2021-03-17 (×5): qty 1

## 2021-03-17 MED ORDER — LEVOTHYROXINE SODIUM 100 MCG PO TABS
200.0000 ug | ORAL_TABLET | Freq: Every day | ORAL | Status: DC
  Administered 2021-03-18: 200 ug via ORAL
  Filled 2021-03-17: qty 2

## 2021-03-17 NOTE — Assessment & Plan Note (Signed)
Pt strongly advised to stop and nicotine patch ordered.  ?

## 2021-03-17 NOTE — Assessment & Plan Note (Signed)
Stable on home meds.  Following on monitor.  ?

## 2021-03-17 NOTE — Progress Notes (Signed)
eLink Physician-Brief Progress Note ?Patient Name: Terri Casey ?DOB: 1960-10-30 ?MRN: [id number] ? ? ?Date of Service ? 03/17/2021  ?HPI/Events of Note ? 61 yr old smoker with COPD and obesity presents with acute on chronic hypercapneic resp failure requiring noninvasive ventilation.  No acute radiographic findings.  Admit note reflects concern about upper airway obstruction from prior intubation.  Presently resting on bipap pulling tidal volumes of nearly 1 liter.  Ipap quite elevated at 25 but patient comfortable.    ?eICU Interventions ? Chart reviewed. ?Repeat abg ordered for the am  ? ? ? ?Intervention Category ?Evaluation Type: New Patient Evaluation ? Mauri Brooklyn, P ?03/17/2021, 12:29 AM ?

## 2021-03-17 NOTE — Progress Notes (Signed)
?  Transition of Care (TOC) Screening Note ? ? ?Patient Details  ?Name: Terri Casey ?Date of Birth: May 27, 1960 ? ? ?Transition of Care (TOC) CM/SW Contact:    ?Iona Beard, LCSWA ?Phone Number: ?03/17/2021, 10:18 AM ? ? ? ?Transition of Care Department [facility]) has reviewed patient and no TOC needs have been identified at this time. We will continue to monitor patient advancement through interdisciplinary progression rounds. If new patient transition needs arise, please place a TOC consult. ?  ?

## 2021-03-17 NOTE — Assessment & Plan Note (Signed)
Stable on home meds.  ?

## 2021-03-17 NOTE — Assessment & Plan Note (Addendum)
Bipap treatment as ordered in hospital with good results with nightly use.  ?Home NIV being arranged.  ? ?

## 2021-03-17 NOTE — Progress Notes (Signed)
? ?NAME:  Terri Casey, MRN:  [id number], DOB:  20-Jul-1960, LOS: 1 ?ADMISSION DATE:  03/16/2021, CONSULTATION DATE:  3/16 ?REFERRING MD:  [place]/ [facility] , CHIEF COMPLAINT:  acute resp distress   ? ?History of Present Illness:  ?57 yowf active smoker    with medical history significant for COPD, asthma with chronic respiratory failure on home oxygen, OSA, DM, hypertension reported to have with progressive dyspnea, cough,  was extremely short of breath when paramedics arrived, she received DuoNebs   with magnesium and steroids, without much improvement, so she was brought to ED, patient herself denied any fever, chills or any COVID exposures, she denies any chest pain, reports she is still smoking 1 pack lasts her 3 to 4 days.. ?-In ED patient was significantly dyspneic, hypoxic where she was started on BiPAP, ABG after 20 minutes on BiPAP showing pH of 7.24, PCO2 of 98, and PO2 of 81, BNP  141 and  patient report feeling better after brief use of BiPAP and IV steroids,   [facility] hospitalist consulted to admit. ? ?PCCM asked to see in ER on BiPAP ? Will need ET - not able to give much hx though doe at baseline appears to be with adl's/ across the room. ? ?Seen 06/23/20 p covid pna with doe room to room = MMRC3 = can't walk 100 yards even at a slow pace at a flat grade s stopping due to sob. On cpap from [facility]. On 2lpm with sats 912% on 2lpm with ambulation and rec stiolo and pfts but did not return for f/u  ?  ? ?Pertinent  Medical History  ?Anxiety ?Asthma ?CHF ?COPD - no pfts on file  ?Diabetes mellitus without complication ?Hypertension not on acei ?Thyroid disease.  ? ?Significant Hospital Events: ?Including procedures, antibiotic start and stop dates in addition to other pertinent events   ?BC x 2  3/16 >>>  ?Bipap 3/16 >>>  ?Zmax 3/16 >>> ?CTX  3/16 >>>  ? ? ?Scheduled Meds: ? amLODipine  10 mg Oral Daily  ? atorvastatin  20 mg Oral QHS  ? bethanechol  5 mg Oral BID  ? Chlorhexidine Gluconate Cloth  6 each  Topical Daily  ? escitalopram  10 mg Oral Daily  ? folic acid  1 mg Oral Daily  ? furosemide  40 mg Oral Daily  ? ipratropium-albuterol  3 mL Nebulization Q4H  ? [START ON 03/18/2021] levothyroxine  200 mcg Oral Q0600  ? loratadine  10 mg Oral Daily  ? methylPREDNISolone (SOLU-MEDROL) injection  125 mg Intravenous Q6H  ? metoprolol tartrate  12.5 mg Oral BID  ? nicotine  21 mg Transdermal Daily  ? pantoprazole (PROTONIX) IV  40 mg Intravenous Q24H  ? rivaroxaban  20 mg Oral Q supper  ? topiramate  100 mg Oral BID  ? ?Continuous Infusions: ? cefTRIAXone (ROCEPHIN)  IV    ? ?PRN Meds:.acetaminophen **OR** acetaminophen, albuterol, cyclobenzaprine, ondansetron **OR** ondansetron (ZOFRAN) IV  ? ? ?Interim History / Subjective:  ?On bipap overnight, trying on HFN 02/ up in chair as much as possible  ? ?Objective   ?Blood pressure (!) 164/88, pulse (!) 103, temperature 99 ?F (37.2 ?C), temperature source Axillary, resp. rate 18, height '5\' 9"'$  (1.753 m), weight 111.9 kg, SpO2 91 %. ?   ?FiO2 (%):  [35 %-45 %] 40 %  ? ?Intake/Output Summary (Last 24 hours) at 03/17/2021 0629 ?Last data filed at 03/16/2021 1858 ?Gross per 24 hour  ?Intake 451.47 ml  ?  Output --  ?Net 451.47 ml  ? ?Filed Weights  ? 03/16/21 1418 03/16/21 2355  ?Weight: 104.3 kg 111.9 kg  ? ? ?Examination: ? Tmax   100.2  ?General appearance:    obese disheveled elderly wf sitting in chair   ?At Rest 02 sats  92% on 10 lpmHFNC   ?No jvd ?Oropharynx clear,  mucosa nl ?Neck supple ?Lungs with insp/exp rhonchi with upper airway component  as well  ?RRR no s3 or or sign murmur ?Abd obese with limited  excursion  ?Extr warm with no edema or clubbing noted ?Neuro  Sensorium intact,  no apparent motor deficits    ? ?   ? ?Assessment & Plan:  ?1) Acute on chronic resp failure hypoxemic and hypercarbic  with both upper and lower airflow obst apparent ? From prior ET?  Though last admit in 10/2019 did not require ET with similar presentation and she has large buffering  capacity so see how she does on bipap prn /keep up in chair as much as possible (fine with me if she sleeps in it as does at home)  ? ?2) severe copd/ still smoking/ doubt adherence/ 02 dep ?- rx as aecopd as you are but subtitute brovana/yupelri for duoneb due to national shortage   ? 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 ? ? ?Best Practice (right click and "Reselect all SmartList Selections" daily)  ?Per [facility]  ? ?Labs   ?CBC: ?Recent Labs  ?Lab 03/16/21 ?1423 03/17/21 ?0412  ?WBC 10.4 4.9  ?NEUTROABS 7.8*  --   ?HGB 13.7 12.0  ?HCT 47.1* 41.5  ?MCV 112.4* 107.5*  ?PLT 177 149*  ? ? ?Basic Metabolic Panel: ?Recent Labs  ?Lab 03/16/21 ?1423 03/17/21 ?0412  ?NA 141 142  ?K 4.4 4.5  ?CL 93* 96*  ?CO2 40* 39*  ?GLUCOSE 217* 128*  ?BUN 21 28*  ?CREATININE 0.78 0.67  ?CALCIUM 9.5 9.4  ? ?GFR: ?Estimated Creatinine Clearance: 97.3 mL/min (by C-G formula based on SCr of 0.67 mg/dL). ?Recent Labs  ?Lab 03/16/21 ?1423 03/16/21 ?1721 03/17/21 ?0412  ?WBC 10.4  --  4.9  ?LATICACIDVEN 1.1 1.1  --   ? ? ?Liver Function Tests: ?Recent Labs  ?Lab 03/16/21 ?1423  ?AST 19  ?ALT 11  ?ALKPHOS 145*  ?BILITOT 0.5  ?PROT 7.9  ?ALBUMIN 3.7  ? ?No results for input(s): LIPASE, AMYLASE in the last 168 hours. ?No results for input(s): AMMONIA in the last 168 hours. ? ?ABG ?   ?Component Value Date/Time  ? PHART 7.4 03/17/2021 0505  ? PCO2ART 69 (HH) 03/17/2021 0505  ? PO2ART 66 (L) 03/17/2021 0505  ? HCO3 42.7 (H) 03/17/2021 0505  ? TCO2 40 (H) 06/13/2019 1026  ? ACIDBASEDEF 7.7 (H) 08/09/2019 0748  ? O2SAT 95.2 03/17/2021 0505  ?  ? ?Coagulation Profile: ?Recent Labs  ?Lab 03/16/21 ?1423  ?INR 1.0  ? ? ?Cardiac Enzymes: ?No results for input(s): CKTOTAL, CKMB, CKMBINDEX, TROPONINI in the last 168 hours. ? ?HbA1C: ?Hgb A1c MFr Bld  ?Date/Time Value Ref Range Status  ?10/07/2019 01:27 PM 5.0 4.8 -  5.6 % Final  ?  Comment:  ?  (NOTE) ?Pre diabetes:          5.7%-6.4% ? ?Diabetes:              >6.4% ? ?Glycemic control for   <  7.0% ?adults with diabetes ?  ?06/09/2019 01:55 PM 5.1 4.8 - 5.6 % Final  ?  Comment:  ?  (NOTE) ?Pre diabetes:          5.7%-6.4% ?Diabetes:              >6.4% ?Glycemic control for   <7.0% ?adults with diabetes ?  ? ? ?CBG: ?No results for input(s): GLUCAP in the last 168 hours. ? ?   ? The patient is critically ill with multiple organ systems failure and requires high complexity decision making for assessment and support, frequent evaluation and titration of therapies, application of advanced monitoring technologies and extensive interpretation of multiple databases. Critical Care Time devoted to patient care services described in this note is 40 min minutes. ? ? ?Christinia Gully, MD ?Pulmonary and Critical Care Medicine ?[place] ?Cell [phone]  ? ?After 7:00 pm call Elink  [phone]   ? ? ?

## 2021-03-17 NOTE — Plan of Care (Signed)
?  Problem: Acute Rehab PT Goals(only PT should resolve) ?Goal: Pt Will Go Supine/Side To Sit ?Outcome: Progressing ?Flowsheets (Taken 03/17/2021 1028) ?Pt will go Supine/Side to Sit: ? Independently ? with modified independence ?Goal: Patient Will Transfer Sit To/From Stand ?Outcome: Progressing ?Flowsheets (Taken 03/17/2021 1028) ?Patient will transfer sit to/from stand: ? Independently ? with modified independence ?Goal: Pt Will Transfer Bed To Chair/Chair To Bed ?Outcome: Progressing ?Flowsheets (Taken 03/17/2021 1028) ?Pt will Transfer Bed to Chair/Chair to Bed: ? Independently ? with modified independence ?Goal: Pt Will Ambulate ?Outcome: Progressing ?Flowsheets (Taken 03/17/2021 1028) ?Pt will Ambulate: ? 50 feet ? with supervision ? with rolling walker ? with least restrictive assistive device ?  ?10:29 AM, 03/17/21 ?Lonell Grandchild, MPT ?Physical Therapist with [place] ?[facility] ?[phone] office ?[phone] mobile phone ? ?

## 2021-03-17 NOTE — Assessment & Plan Note (Addendum)
Resumed home atrovastatin daily  ?

## 2021-03-17 NOTE — Assessment & Plan Note (Addendum)
Resumed home levothyroxine.  ?

## 2021-03-17 NOTE — Progress Notes (Signed)
?PROGRESS NOTE ? ? ?Terri Casey  ZOX:[id number] DOB: 05/30/1960 DOA: 03/16/2021 ?PCP: [facility]  ? ?Chief Complaint  ?Patient presents with  ? Altered Mental Status  ? ?Level of care: Stepdown ? ?Brief Admission History:  ? 61 y.o. female,  with medical history significant for COPD, asthma with chronic respiratory failure on home oxygen, OSA, DM, hypertension. ?-Patient with known history of COPD, chronic hypoxic respiratory failure on oxygen, she is still smoking, is lethargic but open eye and provide some history, history was obtained mainly from ED physician, patient with progressive dyspnea, cough, he was extremely short of breath when paramedics arrived, she received DuoNebs treatment, like with magnesium and steroids, without much improvement, so she was brought to ED, patient herself denies any fever, chills or any COVID exposures, she denies any chest pain, reports she is still smoking 1 pack lasts her 3 to 4 days.. ?-In ED patient was significantly dyspneic, hypoxic where she was started on BiPAP, ABG after 20 minutes on BiPAP showing pH of 7.24, PCO2 of 98, and PO2 of 81, BNP mildly elevated at 141, but no scar congestion or imaging, and no edema on physical exam, patient report feeling better after brief use of BiPAP and IV steroids, ED discussed with PCCM who will evaluate the patient as well, [facility] hospitalist consulted to admit. ?  ?03/17/2021:  Pt tolerated BiPAP well.  She has been weaned off BiPAP temporarily.  Tolerating diet.  Remains very short of breath.  Tolerating bronchodilators.  Patient says she is wanting to try to stop smoking but has been unable to.  Agreeable to nicotine patch. ?  ?Assessment and Plan: ?* Acute hypercapnic respiratory failure (HCC) ?Bipap treatment as ordered ?High risk for intubation (difficult airway) and trying to avoid intubation ? ?Chronic diastolic CHF (congestive heart failure) ([place]) ?Resume home meds and monitor fluid volume  status.  ? ?AF (paroxysmal atrial fibrillation) ([place]) ?Stable on home meds.  Following on monitor.  ? ?COPD exacerbation ([place]) ?IV steroids and regular bronchodilators.  ? ?Acute respiratory failure with hypoxia ([place]) ?Following pulmonary recommendations ?Continue aggressive treatments as ordered.  ? ?Hypothyroidism ?Resume home levothyroxine.  ? ?EMPHYSEMA-on home O2 ?Treated acute exacerbation ?Appreciate pulmonary consultation  ? ?Essential hypertension, benign ?Stable on home meds.  ? ?CIGARETTE SMOKER ?Pt strongly advised to stop and nicotine patch ordered.  ? ?Hyperlipidemia ?Resume home atrovastatin daily  ? ?DVT prophylaxis: rivaroxaban ?Code Status: Full  ?Family Communication:  ?Disposition: Status is: Inpatient ?Remains inpatient appropriate because: needs bipap, IV steroids, antibiotics... ?  ?Consultants:  ?pulmonary ?Procedures:  ?bipap ?Antimicrobials:  ?See MAR  ?Subjective: ?Pt remains very SOB.  No chest pain.  Some palpitations.  ?Objective: ?Vitals:  ? 03/17/21 1232 03/17/21 1300 03/17/21 1400 03/17/21 1500  ?BP:  128/73 138/63 135/66  ?Pulse: (!) 111 (!) 108 (!) 106 (!) 102  ?Resp: (!) 27 (!) 28 (!) 24 (!) 23  ?Temp:      ?TempSrc:      ?SpO2: 92% 97% 91% 90%  ?Weight:      ?Height:      ? ? ?Intake/Output Summary (Last 24 hours) at 03/17/2021 1555 ?Last data filed at 03/17/2021 1529 ?Gross per 24 hour  ?Intake 1004.07 ml  ?Output 275 ml  ?Net 729.07 ml  ? ?Filed Weights  ? 03/16/21 1418 03/16/21 2355  ?Weight: 104.3 kg 111.9 kg  ? ?Examination: ? ?General exam: Appears calm and comfortable  ?Respiratory system: Clear to auscultation. Respiratory effort normal. ?Cardiovascular  system: normal S1 & S2 heard. No JVD, murmurs, rubs, gallops or clicks. No pedal edema. ?Gastrointestinal system: Abdomen is nondistended, soft and nontender. No organomegaly or masses felt. Normal bowel sounds heard. ?Central nervous system: Alert and oriented. No focal neurological deficits. ?Extremities: Symmetric 5 x 5  power. ?Skin: No rashes, lesions or ulcers. ?Psychiatry: Judgement and insight appear normal. Mood & affect appropriate.  ? ?Data Reviewed: I have personally reviewed following labs and imaging studies ? ?CBC: ?Recent Labs  ?Lab 03/16/21 ?1423 03/17/21 ?0412  ?WBC 10.4 4.9  ?NEUTROABS 7.8*  --   ?HGB 13.7 12.0  ?HCT 47.1* 41.5  ?MCV 112.4* 107.5*  ?PLT 177 149*  ? ? ?Basic Metabolic Panel: ?Recent Labs  ?Lab 03/16/21 ?1423 03/17/21 ?0412  ?NA 141 142  ?K 4.4 4.5  ?CL 93* 96*  ?CO2 40* 39*  ?GLUCOSE 217* 128*  ?BUN 21 28*  ?CREATININE 0.78 0.67  ?CALCIUM 9.5 9.4  ? ? ?CBG: ?No results for input(s): GLUCAP in the last 168 hours. ? ?Recent Results (from the past 240 hour(s))  ?Blood Culture (routine x 2)     Status: None (Preliminary result)  ? Collection Time: 03/16/21  2:17 PM  ? Specimen: BLOOD  ?Result Value Ref Range Status  ? Specimen Description BLOOD BLOOD RIGHT HAND  Final  ? Special Requests   Final  ?  BOTTLES DRAWN AEROBIC ONLY Blood Culture results may not be optimal due to an inadequate volume of blood received in culture bottles  ? Culture   Final  ?  NO GROWTH < 24 HOURS ?Performed at [facility], [address], [place], [place] [address] ?  ? Report Status PENDING  Incomplete  ?Blood Culture (routine x 2)     Status: None (Preliminary result)  ? Collection Time: 03/16/21  2:23 PM  ? Specimen: BLOOD  ?Result Value Ref Range Status  ? Specimen Description BLOOD BLOOD LEFT FOREARM  Final  ? Special Requests   Final  ?  BOTTLES DRAWN AEROBIC AND ANAEROBIC Blood Culture adequate volume  ? Culture   Final  ?  NO GROWTH < 24 HOURS ?Performed at [facility], [address], [place], [place] [address] ?  ? Report Status PENDING  Incomplete  ?Resp Panel by RT-PCR (Flu A&B, Covid) Nasopharyngeal Swab     Status: None  ? Collection Time: 03/16/21 10:05 PM  ? Specimen: Nasopharyngeal Swab; Nasopharyngeal(NP) swabs in vial transport medium  ?Result Value Ref Range Status  ? SARS Coronavirus 2 by RT PCR NEGATIVE  NEGATIVE Final  ?  Comment: (NOTE) ?SARS-CoV-2 target nucleic acids are NOT DETECTED. ? ?The SARS-CoV-2 RNA is generally detectable in upper respiratory ?specimens during the acute phase of infection. The lowest ?concentration of SARS-CoV-2 viral copies this assay can detect is ?138 copies/mL. A negative result does not preclude SARS-Cov-2 ?infection and should not be used as the sole basis for treatment or ?other patient management decisions. A negative result may occur with  ?improper specimen collection/handling, submission of specimen other ?than nasopharyngeal swab, presence of viral mutation(s) within the ?areas targeted by this assay, and inadequate number of viral ?copies(<138 copies/mL). A negative result must be combined with ?clinical observations, patient history, and epidemiological ?information. The expected result is Negative. ? ?Fact Sheet for Patients:  ?EntrepreneurPulse.com.au ? ?Fact Sheet for Healthcare Providers:  ?IncredibleEmployment.be ? ?This test is no t yet approved or cleared by the [place] FDA and  ?has been authorized for detection and/or diagnosis of SARS-CoV-2 by ?FDA  under an Emergency Use Authorization (EUA). This EUA will remain  ?in effect (meaning this test can be used) for the duration of the ?COVID-19 declaration under Section 564(b)(1) of the Act, 21 ?U.S.C.section 360bbb-3(b)(1), unless the authorization is terminated  ?or revoked sooner.  ? ? ?  ? Influenza A by PCR NEGATIVE NEGATIVE Final  ? Influenza B by PCR NEGATIVE NEGATIVE Final  ?  Comment: (NOTE) ?The Xpert Xpress SARS-CoV-2/FLU/RSV plus assay is intended as an aid ?in the diagnosis of influenza from Nasopharyngeal swab specimens and ?should not be used as a sole basis for treatment. Nasal washings and ?aspirates are unacceptable for Xpert Xpress SARS-CoV-2/FLU/RSV ?testing. ? ?Fact Sheet for Patients: ?EntrepreneurPulse.com.au ? ?Fact Sheet for Healthcare  Providers: ?IncredibleEmployment.be ? ?This test is not yet approved or cleared by the [place] FDA and ?has been authorized for detection and/or diagnosis of SARS-CoV-2 by ?FDA under an

## 2021-03-17 NOTE — Assessment & Plan Note (Addendum)
Treating acute exacerbation ?Appreciate pulmonary consultation  ?TOC arranged for home NIV.  ?

## 2021-03-17 NOTE — Assessment & Plan Note (Addendum)
Following pulmonary recommendations ?TOC made arrangements home NIV option.   ?Pt is doing much better now and wants to go home.  ?DC home today.  Home NIV has been arranged.   ?

## 2021-03-17 NOTE — Assessment & Plan Note (Addendum)
IV steroids and bronchodilators.  ?Pt feels well.  DC home on long steroid taper.  ?

## 2021-03-17 NOTE — Hospital Course (Addendum)
61 y.o. female,  with medical history significant for COPD, asthma with chronic respiratory failure on home oxygen, OSA, DM, hypertension. ?-Patient with known history of COPD, chronic hypoxic respiratory failure on oxygen, she is still smoking, is lethargic but open eye and provide some history, history was obtained mainly from ED physician, patient with progressive dyspnea, cough, he was extremely short of breath when paramedics arrived, she received DuoNebs treatment, like with magnesium and steroids, without much improvement, so she was brought to ED, patient herself denies any fever, chills or any COVID exposures, she denies any chest pain, reports she is still smoking 1 pack lasts her 3 to 4 days.. ?-In ED patient was significantly dyspneic, hypoxic where she was started on BiPAP, ABG after 20 minutes on BiPAP showing pH of 7.24, PCO2 of 98, and PO2 of 81, BNP mildly elevated at 141, but no scar congestion or imaging, and no edema on physical exam, patient report feeling better after brief use of BiPAP and IV steroids, ED discussed with PCCM who will evaluate the patient as well, [facility] hospitalist consulted to admit. ?  ?03/17/2021:  Pt tolerated BiPAP well.  She has been weaned off BiPAP temporarily.  Tolerating diet.  Remains very short of breath.  Tolerating bronchodilators.  Patient says she is wanting to try to stop smoking but has been unable to.  Agreeable to nicotine patch. ? ?03/18/2021: Pt remains on bipap.  Severe SOB with only a few steps to bedside commode. Agreed to nicotine patch.   ? ?03/19/2021:  Pt says she has qualified for CPAP in past but never received equipment.  She was on bipap all night, now on [place].   ? ?03/20/2021: Remains on bipap.  Transfer out of SDU to telemetry.  TOC requested for NIV for home or bipap for home.   ? ?03/21/2021:  Pt requesting to discharge home.  Breathing much better with bipap.  Arrangements made and forms signed by MD for home NIV.  Pt to discharge home today.     ?

## 2021-03-17 NOTE — Assessment & Plan Note (Addendum)
Resume home meds and monitor fluid volume status.  ?Appears well compensated and not fluid overloaded.  ?

## 2021-03-17 NOTE — Evaluation (Signed)
Physical Therapy Evaluation ?Patient Details ?Name: Terri Casey ?MRN: [id number] ?DOB: 03-16-1960 ?Today's Date: 03/17/2021 ? ?History of Present Illness ? Terri Casey  is a 61 y.o. female,  with medical history significant for COPD, asthma with chronic respiratory failure on home oxygen, OSA, DM, hypertension.  -Patient with known history of COPD, chronic hypoxic respiratory failure on oxygen, she is still smoking, is lethargic but open eye and provide some history, history was obtained mainly from ED physician, patient with progressive dyspnea, cough, he was extremely short of breath when paramedics arrived, she received DuoNebs treatment, like with magnesium and steroids, without much improvement, so she was brought to ED, patient herself denies any fever, chills or any COVID exposures, she denies any chest pain, reports she is still smoking 1 pack lasts her 3 to 4 days..  -In ED patient was significantly dyspneic, hypoxic where she was started on BiPAP, ABG after 20 minutes on BiPAP showing pH of 7.24, PCO2 of 98, and PO2 of 81, BNP mildly elevated at 141, but no scar congestion or imaging, and no edema on physical exam, patient report feeling better after brief use of BiPAP and IV steroids, ED discussed with PCCM who will evaluate the patient as well, [facility] hospitalist consulted to admit. ?  ?Clinical Impression ? Patient functioning near baseline for functional mobility and gait other than limited mostly due to severe SOB with SpO2 dropping from 94% to 78% while on 15 LPM O2 with exertion during transfer to chair.  Patient unable to attempt ambulation due to this and tolerated staying up in chair after therapy with her son present in room - RN aware.  Patient will benefit from continued skilled physical therapy in hospital and recommended venue below to increase strength, balance, endurance for safe ADLs and gait.  ?   ?   ? ?Recommendations for follow up therapy are one component of a  multi-disciplinary discharge planning process, led by the attending physician.  Recommendations may be updated based on patient status, additional functional criteria and insurance authorization. ? ?Follow Up Recommendations Home health PT ? ?  ?Assistance Recommended at Discharge Set up Supervision/Assistance  ?Patient can return home with the following ? A little help with walking and/or transfers;A little help with bathing/dressing/bathroom;Help with stairs or ramp for entrance;Assistance with cooking/housework ? ?  ?Equipment Recommendations None recommended by PT  ?Recommendations for Other Services ?    ?  ?Functional Status Assessment Patient has had a recent decline in their functional status and demonstrates the ability to make significant improvements in function in a reasonable and predictable amount of time.  ? ?  ?Precautions / Restrictions Precautions ?Precautions: Fall ?Restrictions ?Weight Bearing Restrictions: No  ? ?  ? ?Mobility ? Bed Mobility ?Overal bed mobility: Modified Independent ?  ?  ?  ?  ?  ?  ?  ?  ? ?Transfers ?Overall transfer level: Needs assistance ?Equipment used: None ?Transfers: Sit to/from Stand, Bed to chair/wheelchair/BSC ?Sit to Stand: Supervision ?  ?Step pivot transfers: Supervision ?  ?  ?  ?General transfer comment: increased time, labored movement ?  ? ?Ambulation/Gait ?Ambulation/Gait assistance: Min guard ?Gait Distance (Feet): 4 Feet ?Assistive device: Rolling walker (2 wheels), None, 1 person hand held assist ?Gait Pattern/deviations: Decreased step length - right, Decreased step length - left, Decreased stride length ?Gait velocity: decreased ?  ?  ?General Gait Details: limitd to a few steps at bedside due to SOB with SpO2 dropping from 94% to 78% while  on 15 LPM O2d ? ?Stairs ?  ?  ?  ?  ?  ? ?Wheelchair Mobility ?  ? ?Modified Rankin (Stroke Patients Only) ?  ? ?  ? ?Balance Overall balance assessment: Needs assistance ?Sitting-balance support: Feet supported, No  upper extremity supported ?Sitting balance-Leahy Scale: Good ?Sitting balance - Comments: seated at EOB ?  ?Standing balance support: During functional activity, No upper extremity supported ?Standing balance-Leahy Scale: Fair ?Standing balance comment: without AD ?  ?  ?  ?  ?  ?  ?  ?  ?  ?  ?  ?   ? ? ? ?Pertinent Vitals/Pain Pain Assessment ?Pain Assessment: No/denies pain  ? ? ?Home Living Family/patient expects to be discharged to:: Private residence ?Living Arrangements: Children ?Available Help at Discharge: Family;Personal care attendant;Available PRN/intermittently ?Type of Home: House ?Home Access: Stairs to enter ?Entrance Stairs-Rails: Right ?Entrance Stairs-Number of Steps: 3-4 ?  ?Home Layout: One level ?Home Equipment: BSC/3in1;Rolling Walker (2 wheels);Toilet riser;Cane - single point;Wheelchair - manual ?   ?  ?Prior Function Prior Level of Function : Needs assist ?  ?  ?  ?Physical Assist : Mobility (physical);ADLs (physical) ?Mobility (physical): Bed mobility;Transfers;Gait;Stairs ?ADLs (physical): Bathing ?Mobility Comments: Household ambulator with frequent leaning on walls, furniture, uses RW for longer distances ?ADLs Comments: home aides from 8am to 3pm x 5 days/week, assisted by family ?  ? ? ?Hand Dominance  ? Dominant Hand: Right ? ?  ?Extremity/Trunk Assessment  ?   ?  ? ?  ?  ? ?   ?Communication  ? Communication: No difficulties  ?Cognition Arousal/Alertness: Awake/alert ?Behavior During Therapy: [facility] for tasks assessed/performed ?Overall Cognitive Status: Within Functional Limits for tasks assessed ?  ?  ?  ?  ?  ?  ?  ?  ?  ?  ?  ?  ?  ?  ?  ?  ?  ?  ?  ? ?  ?General Comments   ? ?  ?Exercises    ? ?Assessment/Plan  ?  ?PT Assessment Patient needs continued PT services  ?PT Problem List Decreased strength;Decreased activity tolerance;Decreased balance;Decreased mobility ? ?   ?  ?PT Treatment Interventions DME instruction;Gait training;Stair training;Functional mobility  training;Therapeutic activities;Therapeutic exercise;Patient/family education;Balance training   ? ?PT Goals (Current goals can be found in the Care Plan section)  ?Acute Rehab PT Goals ?Patient Stated Goal: return home with family and home aides to assist ?PT Goal Formulation: With patient/family ?Time For Goal Achievement: 03/24/21 ?Potential to Achieve Goals: Good ? ?  ?Frequency Min 3X/week ?  ? ? ?Co-evaluation   ?  ?  ?  ?  ? ? ?  ?AM-PAC PT "6 Clicks" Mobility  ?Outcome Measure Help needed turning from your back to your side while in a flat bed without using bedrails?: None ?Help needed moving from lying on your back to sitting on the side of a flat bed without using bedrails?: None ?Help needed moving to and from a bed to a chair (including a wheelchair)?: A Little ?Help needed standing up from a chair using your arms (e.g., wheelchair or bedside chair)?: A Little ?Help needed to walk in hospital room?: A Little ?Help needed climbing 3-5 steps with a railing? : A Lot ?6 Click Score: 19 ? ?  ?End of Session   ?Activity Tolerance: Patient tolerated treatment well;Patient limited by fatigue;Other (comment) (SOB with SpO2 dropping) ?Patient left: in chair;with family/visitor present ?Nurse Communication: Mobility status ?PT Visit Diagnosis: Unsteadiness  on feet (R26.81);Other abnormalities of gait and mobility (R26.89);Muscle weakness (generalized) (M62.81) ?  ? ?Time: 1443-1540 ?PT Time Calculation (min) (ACUTE ONLY): 20 min ? ? ?Charges:   PT Evaluation ?$PT Eval Moderate Complexity: 1 Mod ?PT Treatments ?$Therapeutic Activity: 8-22 mins ?  ?   ? ? ?10:26 AM, 03/17/21 ?Lonell Grandchild, MPT ?Physical Therapist with [place] ?[facility] ?[phone] office ?[phone] mobile phone ? ? ?

## 2021-03-18 DIAGNOSIS — J9602 Acute respiratory failure with hypercapnia: Secondary | ICD-10-CM | POA: Diagnosis not present

## 2021-03-18 DIAGNOSIS — J9601 Acute respiratory failure with hypoxia: Secondary | ICD-10-CM | POA: Diagnosis not present

## 2021-03-18 DIAGNOSIS — I480 Paroxysmal atrial fibrillation: Secondary | ICD-10-CM | POA: Diagnosis not present

## 2021-03-18 DIAGNOSIS — I5032 Chronic diastolic (congestive) heart failure: Secondary | ICD-10-CM | POA: Diagnosis not present

## 2021-03-18 MED ORDER — ALUM & MAG HYDROXIDE-SIMETH 200-200-20 MG/5ML PO SUSP
30.0000 mL | ORAL | Status: DC | PRN
  Administered 2021-03-18 – 2021-03-19 (×2): 30 mL via ORAL
  Filled 2021-03-18 (×2): qty 30

## 2021-03-18 MED ORDER — DEXTROMETHORPHAN POLISTIREX ER 30 MG/5ML PO SUER
30.0000 mg | Freq: Two times a day (BID) | ORAL | Status: DC
  Administered 2021-03-18 – 2021-03-21 (×6): 30 mg via ORAL
  Filled 2021-03-18 (×10): qty 5

## 2021-03-18 MED ORDER — LEVOTHYROXINE SODIUM 75 MCG PO TABS
225.0000 ug | ORAL_TABLET | Freq: Every day | ORAL | Status: DC
  Administered 2021-03-19 – 2021-03-21 (×3): 225 ug via ORAL
  Filled 2021-03-18: qty 1
  Filled 2021-03-18: qty 3
  Filled 2021-03-18: qty 1

## 2021-03-18 NOTE — Progress Notes (Signed)
?PROGRESS NOTE ? ? ?TRINH SANJOSE  QMV:[id number] DOB: [birth year] DOA: 03/16/2021 ?PCP: [facility]  ? ?Chief Complaint  ?Patient presents with  ? Altered Mental Status  ? ?Level of care: Stepdown ? ?Brief Admission History:  ? 61 y.o. female,  with medical history significant for COPD, asthma with chronic respiratory failure on home oxygen, OSA, DM, hypertension. ?-Patient with known history of COPD, chronic hypoxic respiratory failure on oxygen, she is still smoking, is lethargic but open eye and provide some history, history was obtained mainly from ED physician, patient with progressive dyspnea, cough, he was extremely short of breath when paramedics arrived, she received DuoNebs treatment, like with magnesium and steroids, without much improvement, so she was brought to ED, patient herself denies any fever, chills or any COVID exposures, she denies any chest pain, reports she is still smoking 1 pack lasts her 3 to 4 days.. ?-In ED patient was significantly dyspneic, hypoxic where she was started on BiPAP, ABG after 20 minutes on BiPAP showing pH of 7.24, PCO2 of 98, and PO2 of 81, BNP mildly elevated at 141, but no scar congestion or imaging, and no edema on physical exam, patient report feeling better after brief use of BiPAP and IV steroids, ED discussed with PCCM who will evaluate the patient as well, [facility] hospitalist consulted to admit. ?  ?03/17/2021:  Pt tolerated BiPAP well.  She has been weaned off BiPAP temporarily.  Tolerating diet.  Remains very short of breath.  Tolerating bronchodilators.  Patient says she is wanting to try to stop smoking but has been unable to.  Agreeable to nicotine patch. ? ?03/18/2021: Pt remains on bipap.  Severe SOB with only a few steps to bedside commode. Agreed to nicotine patch.   ?  ?Assessment and Plan: ?* Acute hypercapnic respiratory failure (HCC) ?Bipap treatment as ordered ?High risk for intubation (difficult airway) and trying to avoid  intubation ? ?Chronic diastolic CHF (congestive heart failure) ([place]) ?Resume home meds and monitor fluid volume status.  ? ?AF (paroxysmal atrial fibrillation) ([place]) ?Stable on home meds.  Following on monitor.  ? ?COPD exacerbation ([place]) ?IV steroids and bronchodilators.  ? ?Acute respiratory failure with hypoxia ([place]) ?Following pulmonary recommendations ?Continue aggressive treatments as ordered.  ? ?Hypothyroidism ?Resumed home levothyroxine.  ? ?EMPHYSEMA-on home O2 ?Treating acute exacerbation ?Appreciate pulmonary consultation  ? ?Essential hypertension, benign ?Stable on home meds.  ? ?CIGARETTE SMOKER ?Pt strongly advised to stop and nicotine patch ordered.  ? ?Hyperlipidemia ?Resumed home atrovastatin daily  ? ?DVT prophylaxis: rivaroxaban ?Code Status: Full  ?Family Communication:  ?Disposition: Status is: Inpatient ?Remains inpatient appropriate because: needs bipap, IV steroids, antibiotics... ?  ?Consultants:  ?pulmonary ?Procedures:  ?bipap ?Antimicrobials:  ?See MAR  ?Subjective: ?Pt remains very SOB.  She is still requiring bipap support.  ?Objective: ?Vitals:  ? 03/18/21 0637 03/18/21 0700 03/18/21 0900 03/18/21 1100  ?BP:  (!) 107/44 123/73   ?Pulse:  79 91   ?Resp:  15 (!) 23   ?Temp:    98.1 ?F (36.7 ?C)  ?TempSrc:    Axillary  ?SpO2:  96% 94%   ?Weight: 113.4 kg     ?Height:      ? ? ?Intake/Output Summary (Last 24 hours) at 03/18/2021 1232 ?Last data filed at 03/17/2021 Bosie Helper ?Gross per 24 hour  ?Intake 100.06 ml  ?Output 300 ml  ?Net -199.94 ml  ? ?Filed Weights  ? 03/16/21 1418 03/16/21 2355 03/18/21 0637  ?Weight: 104.3 kg  111.9 kg 113.4 kg  ? ?Examination: ? ?General exam: Appears calm and comfortable  ?Respiratory system: increased work of breathing, diffuse inspiratory/expiratory wheezing heard.  ?Cardiovascular system: normal S1 & S2 heard. No JVD, murmurs, rubs, gallops or clicks. No pedal edema. ?Gastrointestinal system: Abdomen is nondistended, soft and nontender. No organomegaly or  masses felt. Normal bowel sounds heard. ?Central nervous system: Alert and oriented. No focal neurological deficits. ?Extremities: Symmetric 5 x 5 power. ?Skin: No rashes, lesions or ulcers. ?Psychiatry: Judgement and insight appear normal. Mood & affect appropriate.  ? ?Data Reviewed: I have personally reviewed following labs and imaging studies ? ?CBC: ?Recent Labs  ?Lab 03/16/21 ?1423 03/17/21 ?0412  ?WBC 10.4 4.9  ?NEUTROABS 7.8*  --   ?HGB 13.7 12.0  ?HCT 47.1* 41.5  ?MCV 112.4* 107.5*  ?PLT 177 149*  ? ? ?Basic Metabolic Panel: ?Recent Labs  ?Lab 03/16/21 ?1423 03/17/21 ?0412  ?NA 141 142  ?K 4.4 4.5  ?CL 93* 96*  ?CO2 40* 39*  ?GLUCOSE 217* 128*  ?BUN 21 28*  ?CREATININE 0.78 0.67  ?CALCIUM 9.5 9.4  ? ? ?CBG: ?No results for input(s): GLUCAP in the last 168 hours. ? ?Recent Results (from the past 240 hour(s))  ?Blood Culture (routine x 2)     Status: None (Preliminary result)  ? Collection Time: 03/16/21  2:17 PM  ? Specimen: BLOOD  ?Result Value Ref Range Status  ? Specimen Description BLOOD BLOOD RIGHT HAND  Final  ? Special Requests   Final  ?  BOTTLES DRAWN AEROBIC ONLY Blood Culture results may not be optimal due to an inadequate volume of blood received in culture bottles  ? Culture   Final  ?  NO GROWTH 2 DAYS ?Performed at [facility], [address], [place], [place] [address] ?  ? Report Status PENDING  Incomplete  ?Blood Culture (routine x 2)     Status: None (Preliminary result)  ? Collection Time: 03/16/21  2:23 PM  ? Specimen: BLOOD  ?Result Value Ref Range Status  ? Specimen Description BLOOD BLOOD LEFT FOREARM  Final  ? Special Requests   Final  ?  BOTTLES DRAWN AEROBIC AND ANAEROBIC Blood Culture adequate volume  ? Culture   Final  ?  NO GROWTH 2 DAYS ?Performed at [facility], [address], [place], [place] [address] ?  ? Report Status PENDING  Incomplete  ?Urine Culture     Status: Abnormal (Preliminary result)  ? Collection Time: 03/16/21  2:23 PM  ? Specimen: In/Out Cath Urine  ?Result  Value Ref Range Status  ? Specimen Description   Final  ?  IN/OUT CATH URINE ?Performed at [facility], [address], [place], [place] [address] ?  ? Special Requests   Final  ?  NONE ?Performed at [facility], [address], [place], [place] [address] ?  ? Culture (A)  Final  ?  40,000 COLONIES/mL ESCHERICHIA COLI ?SUSCEPTIBILITIES TO FOLLOW ?Performed at [facility], [place] [address] [place],  [address] ?  ? Report Status PENDING  Incomplete  ?Resp Panel by RT-PCR (Flu A&B, Covid) Nasopharyngeal Swab     Status: None  ? Collection Time: 03/16/21 10:05 PM  ? Specimen: Nasopharyngeal Swab; Nasopharyngeal(NP) swabs in vial transport medium  ?Result Value Ref Range Status  ? SARS Coronavirus 2 by RT PCR NEGATIVE NEGATIVE Final  ?  Comment: (NOTE) ?SARS-CoV-2 target nucleic acids are NOT DETECTED. ? ?The SARS-CoV-2 RNA is generally detectable in upper respiratory ?specimens  during the acute phase of infection. The lowest ?concentration of SARS-CoV-2 viral copies this assay can detect is ?138 copies/mL. A negative result does not preclude SARS-Cov-2 ?infection and should not be used as the sole basis for treatment or ?other patient management decisions. A negative result may occur with  ?improper specimen collection/handling, submission of specimen other ?than nasopharyngeal swab, presence of viral mutation(s) within the ?areas targeted by this assay, and inadequate number of viral ?copies(<138 copies/mL). A negative result must be combined with ?clinical observations, patient history, and epidemiological ?information. The expected result is Negative. ? ?Fact Sheet for Patients:  ?EntrepreneurPulse.com.au ? ?Fact Sheet for Healthcare Providers:  ?IncredibleEmployment.be ? ?This test is no t yet approved or cleared by the [place] FDA and  ?has been authorized for detection and/or diagnosis of SARS-CoV-2 by ?FDA under an Emergency Use Authorization (EUA). This EUA  will remain  ?in effect (meaning this test can be used) for the duration of the ?COVID-19 declaration under Section 564(b)(1) of the Act, 21 ?U.S.C.section 360bbb-3(b)(1), unless the authorization is ter

## 2021-03-19 DIAGNOSIS — J9602 Acute respiratory failure with hypercapnia: Secondary | ICD-10-CM | POA: Diagnosis not present

## 2021-03-19 DIAGNOSIS — D229 Melanocytic nevi, unspecified: Secondary | ICD-10-CM | POA: Diagnosis present

## 2021-03-19 DIAGNOSIS — I5032 Chronic diastolic (congestive) heart failure: Secondary | ICD-10-CM | POA: Diagnosis not present

## 2021-03-19 DIAGNOSIS — J9601 Acute respiratory failure with hypoxia: Secondary | ICD-10-CM | POA: Diagnosis not present

## 2021-03-19 DIAGNOSIS — I480 Paroxysmal atrial fibrillation: Secondary | ICD-10-CM | POA: Diagnosis not present

## 2021-03-19 LAB — URINE CULTURE: Culture: 40000 — AB

## 2021-03-19 NOTE — Progress Notes (Signed)
Decreasing BiPAP to 18/6 f 12 , 35 fio2 -- ?

## 2021-03-19 NOTE — Progress Notes (Signed)
Off BiPAP , on HFNC 6 liters.  ?

## 2021-03-19 NOTE — Progress Notes (Addendum)
?PROGRESS NOTE ? ? ?Terri Casey  FMB:[id number] DOB: 05-Nov-1960 DOA: 03/16/2021 ?PCP: [facility]  ? ?Chief Complaint  ?Patient presents with  ? Altered Mental Status  ? ?Level of care: Stepdown ? ?Brief Admission History:  ? 61 y.o. female,  with medical history significant for COPD, asthma with chronic respiratory failure on home oxygen, OSA, DM, hypertension. ?-Patient with known history of COPD, chronic hypoxic respiratory failure on oxygen, she is still smoking, is lethargic but open eye and provide some history, history was obtained mainly from ED physician, patient with progressive dyspnea, cough, he was extremely short of breath when paramedics arrived, she received DuoNebs treatment, like with magnesium and steroids, without much improvement, so she was brought to ED, patient herself denies any fever, chills or any COVID exposures, she denies any chest pain, reports she is still smoking 1 pack lasts her 3 to 4 days.. ?-In ED patient was significantly dyspneic, hypoxic where she was started on BiPAP, ABG after 20 minutes on BiPAP showing pH of 7.24, PCO2 of 98, and PO2 of 81, BNP mildly elevated at 141, but no scar congestion or imaging, and no edema on physical exam, patient report feeling better after brief use of BiPAP and IV steroids, ED discussed with PCCM who will evaluate the patient as well, [facility] hospitalist consulted to admit. ?  ?03/17/2021:  Pt tolerated BiPAP well.  She has been weaned off BiPAP temporarily.  Tolerating diet.  Remains very short of breath.  Tolerating bronchodilators.  Patient says she is wanting to try to stop smoking but has been unable to.  Agreeable to nicotine patch. ? ?03/18/2021: Pt remains on bipap.  Severe SOB with only a few steps to bedside commode. Agreed to nicotine patch.   ? ?03/19/2021:  Pt says she has qualified for CPAP in past but never received equipment.  She was on bipap all night, now on [place].   ?  ?Assessment and Plan: ?* Acute  hypercapnic respiratory failure (HCC) ?Bipap treatment as ordered ?High risk for intubation (difficult airway) and trying to avoid intubation ? ?Suspicious nevus left buttock ?Pt was strongly advised to see a dermatologist as soon as possible to have it removed/sent for pathology and she verbalized understanding and said that she would follow up.   ? ?COPD exacerbation ([place]) ?IV steroids and bronchodilators.  ? ?Chronic diastolic CHF (congestive heart failure) ([place]) ?Resume home meds and monitor fluid volume status.  ? ?AF (paroxysmal atrial fibrillation) ([place]) ?Stable on home meds.  Following on monitor.  ? ?Acute respiratory failure with hypoxia ([place]) ?Following pulmonary recommendations ?Continue aggressive treatments as ordered.  ?Ask TOC about home bipap option.   ? ?Hypothyroidism ?Resumed home levothyroxine.  ? ?EMPHYSEMA-on home O2 ?Treating acute exacerbation ?Appreciate pulmonary consultation  ? ?Essential hypertension, benign ?Stable on home meds.  ? ?CIGARETTE SMOKER ?Pt strongly advised to stop and nicotine patch ordered.  ? ?Hyperlipidemia ?Resumed home atrovastatin daily  ? ?DVT prophylaxis: rivaroxaban ?Code Status: Full  ?Family Communication:  ?Disposition: Status is: Inpatient ?Remains inpatient appropriate because: needs bipap, IV steroids, antibiotics... ?  ?Consultants:  ?pulmonary ?Procedures:  ?bipap ?Antimicrobials:  ?See MAR  ?Subjective: ?Pt just off bipap this morning. She has no cpap at home.  Nicotine patch working so far.   ?Objective: ?Vitals:  ? 03/19/21 0800 03/19/21 0900 03/19/21 1000 03/19/21 1126  ?BP: (!) 122/52 (!) 114/49 (!) 96/40   ?Pulse: 70 77 78   ?Resp: (!) 21 (!) 22 19   ?  Temp:    98.1 ?F (36.7 ?C)  ?TempSrc:    Oral  ?SpO2: 95% 95% 95%   ?Weight:      ?Height:      ? ? ?Intake/Output Summary (Last 24 hours) at 03/19/2021 1223 ?Last data filed at 03/19/2021 1000 ?Gross per 24 hour  ?Intake 1260 ml  ?Output 650 ml  ?Net 610 ml  ? ?Filed Weights  ? 03/16/21 2355 03/18/21  4627 03/19/21 0539  ?Weight: 111.9 kg 113.4 kg 111.1 kg  ? ?Examination: ? ?General exam: Appears calm and comfortable  ?Respiratory system: better air movement diffuse inspiratory/expiratory wheezing heard.  ?Cardiovascular system: normal S1 & S2 heard. No JVD, murmurs, rubs, gallops or clicks. No pedal edema. ?Gastrointestinal system: Abdomen is nondistended, soft and nontender. No organomegaly or masses felt. Normal bowel sounds heard. ?Central nervous system: Alert and oriented. No focal neurological deficits. ?Extremities: Symmetric 5 x 5 power. ?Skin: black flat irregular shaped nevus left buttock. ?Psychiatry: Judgement and insight appear normal. Mood & affect appropriate.  ? ?Data Reviewed: I have personally reviewed following labs and imaging studies ? ?CBC: ?Recent Labs  ?Lab 03/16/21 ?1423 03/17/21 ?0412  ?WBC 10.4 4.9  ?NEUTROABS 7.8*  --   ?HGB 13.7 12.0  ?HCT 47.1* 41.5  ?MCV 112.4* 107.5*  ?PLT 177 149*  ? ? ?Basic Metabolic Panel: ?Recent Labs  ?Lab 03/16/21 ?1423 03/17/21 ?0412  ?NA 141 142  ?K 4.4 4.5  ?CL 93* 96*  ?CO2 40* 39*  ?GLUCOSE 217* 128*  ?BUN 21 28*  ?CREATININE 0.78 0.67  ?CALCIUM 9.5 9.4  ? ? ?CBG: ?No results for input(s): GLUCAP in the last 168 hours. ? ?Recent Results (from the past 240 hour(s))  ?Blood Culture (routine x 2)     Status: None (Preliminary result)  ? Collection Time: 03/16/21  2:17 PM  ? Specimen: BLOOD  ?Result Value Ref Range Status  ? Specimen Description BLOOD BLOOD RIGHT HAND  Final  ? Special Requests   Final  ?  BOTTLES DRAWN AEROBIC ONLY Blood Culture results may not be optimal due to an inadequate volume of blood received in culture bottles  ? Culture   Final  ?  NO GROWTH 2 DAYS ?Performed at [facility], [address], [place], [place] [address] ?  ? Report Status PENDING  Incomplete  ?Blood Culture (routine x 2)     Status: None (Preliminary result)  ? Collection Time: 03/16/21  2:23 PM  ? Specimen: BLOOD  ?Result Value Ref Range Status  ? Specimen  Description BLOOD BLOOD LEFT FOREARM  Final  ? Special Requests   Final  ?  BOTTLES DRAWN AEROBIC AND ANAEROBIC Blood Culture adequate volume  ? Culture   Final  ?  NO GROWTH 2 DAYS ?Performed at [facility], [address] [place], [place] [address] ?  ? Report Status PENDING  Incomplete  ?Urine Culture     Status: Abnormal  ? Collection Time: 03/16/21  2:23 PM  ? Specimen: In/Out Cath Urine  ?Result Value Ref Range Status  ? Specimen Description   Final  ?  IN/OUT CATH URINE ?Performed at [facility], [address], [place], [place] [address] ?  ? Special Requests   Final  ?  NONE ?Performed at [facility], [address] [place], [place] [address] ?  ? Culture 40,000 COLONIES/mL ESCHERICHIA COLI (A)  Final  ? Report Status 03/19/2021 FINAL  Final  ? Organism ID, Bacteria ESCHERICHIA COLI (A)  Final  ?  Susceptibility  ? Escherichia coli - MIC*  ?  AMPICILLIN >=32 RESISTANT Resistant   ?  CEFAZOLIN <=4 SENSITIVE Sensitive   ?  CEFEPIME <=0.12 SENSITIVE Sensitive   ?  CEFTRIAXONE <=0.25 SENSITIVE Sensitive   ?  CIPROFLOXACIN <=0.25 SENSITIVE Sensitive   ?  GENTAMICIN <=1 SENSITIVE Sensitive   ?  IMIPENEM <=0.25 SENSITIVE Sensitive   ?  NITROFURANTOIN <=16 SENSITIVE Sensitive   ?  TRIMETH/SULFA <=20 SENSITIVE Sensitive   ?  AMPICILLIN/SULBACTAM 16 INTERMEDIATE Intermediate   ?  PIP/TAZO <=4 SENSITIVE Sensitive   ?  * 40,000 COLONIES/mL ESCHERICHIA COLI  ?Resp Panel by RT-PCR (Flu A&B, Covid) Nasopharyngeal Swab     Status: None  ? Collection Time: 03/16/21 10:05 PM  ? Specimen: Nasopharyngeal Swab; Nasopharyngeal(NP) swabs in vial transport medium  ?Result Value Ref Range Status  ? SARS Coronavirus 2 by RT PCR NEGATIVE NEGATIVE Final  ?  Comment: (NOTE) ?SARS-CoV-2 target nucleic acids are NOT DETECTED. ? ?The SARS-CoV-2 RNA is generally detectable in upper respiratory ?specimens during the acute phase of infection. The lowest ?concentration of SARS-CoV-2 viral copies this assay can detect is ?138 copies/mL. A  negative result does not preclude SARS-Cov-2 ?infection and should not be used as the sole basis for treatment or ?other patient management decisions. A negative result may occur with  ?improper specimen colle

## 2021-03-19 NOTE — Assessment & Plan Note (Addendum)
Pt was strongly advised to see a dermatologist as soon as possible to have it removed/sent for pathology and she verbalized understanding and said that she would follow up.   ?

## 2021-03-20 DIAGNOSIS — I5032 Chronic diastolic (congestive) heart failure: Secondary | ICD-10-CM | POA: Diagnosis not present

## 2021-03-20 DIAGNOSIS — J9602 Acute respiratory failure with hypercapnia: Secondary | ICD-10-CM | POA: Diagnosis not present

## 2021-03-20 DIAGNOSIS — J9601 Acute respiratory failure with hypoxia: Secondary | ICD-10-CM | POA: Diagnosis not present

## 2021-03-20 DIAGNOSIS — I480 Paroxysmal atrial fibrillation: Secondary | ICD-10-CM | POA: Diagnosis not present

## 2021-03-20 LAB — BASIC METABOLIC PANEL
Anion gap: 8 (ref 5–15)
BUN: 33 mg/dL — ABNORMAL HIGH (ref 8–23)
CO2: 38 mmol/L — ABNORMAL HIGH (ref 22–32)
Calcium: 9.2 mg/dL (ref 8.9–10.3)
Chloride: 93 mmol/L — ABNORMAL LOW (ref 98–111)
Creatinine, Ser: 0.79 mg/dL (ref 0.44–1.00)
GFR, Estimated: >60 mL/min (ref >60)
Glucose, Bld: 124 mg/dL — ABNORMAL HIGH (ref 70–99)
Potassium: 4.5 mmol/L (ref 3.5–5.1)
Sodium: 139 mmol/L (ref 135–145)

## 2021-03-20 MED ORDER — ORAL CARE MOUTH RINSE: 15.0000 mL | Freq: Two times a day (BID) | OROMUCOSAL | Status: DC

## 2021-03-20 MED ORDER — CHLORHEXIDINE GLUCONATE 0.12 % MT SOLN
15.0000 mL | Freq: Two times a day (BID) | OROMUCOSAL | Status: DC
  Administered 2021-03-20 – 2021-03-21 (×2): 15 mL via OROMUCOSAL
  Filled 2021-03-20 (×2): qty 15

## 2021-03-20 MED ORDER — METHYLPREDNISOLONE SODIUM SUCC 125 MG IJ SOLR
60.0000 mg | Freq: Two times a day (BID) | INTRAMUSCULAR | Status: DC
  Administered 2021-03-20 – 2021-03-21 (×2): 60 mg via INTRAVENOUS
  Filled 2021-03-20 (×2): qty 2

## 2021-03-20 NOTE — TOC Initial Note (Signed)
Transition of Care (TOC) - Initial/Assessment Note  ? ? ?Patient Details  ?Name: Terri Casey ?MRN: [id number] ?Date of Birth: 24-Oct-1960 ? ?Transition of Care (TOC) CM/SW Contact:    ?Boneta Lucks, RN ?Phone Number: ?03/20/2021, 12:15 PM ? ?Clinical Narrative:    Patient admitted with Acute hypercapnic respiratory failure. TOC consulted for home NIV. Caryl Pina with [facility] accepted the referral. TOC to follow, DC possibly 2 days.              ? ? ?Expected Discharge Plan: Home/Self Care ?Barriers to Discharge: Continued Medical Work up ? ? ?Patient Goals and CMS Choice ?Patient states their goals for this hospitalization and ongoing recovery are:: to go home. ?CMS Medicare.gov Compare Post Acute Care list provided to:: Patient ?Choice offered to / list presented to : Patient ? ?Expected Discharge Plan and Services ?Expected Discharge Plan: Home/Self Care ? Prior Living Arrangements/Services ?  ?   ? ?Activities of Daily Living ?Home Assistive Devices/Equipment: Gilford Rile (specify type) ?ADL Screening (condition at time of admission) ?Patient's cognitive ability adequate to safely complete daily activities?: No ?Is the patient deaf or have difficulty hearing?: No ?Does the patient have difficulty seeing, even when wearing glasses/contacts?: No ?Does the patient have difficulty concentrating, remembering, or making decisions?: Yes ?Patient able to express need for assistance with ADLs?: Yes ?Does the patient have difficulty dressing or bathing?: Yes ?Independently performs ADLs?: No ?Communication: Needs assistance ?Is this a change from baseline?: Pre-admission baseline ?Dressing (OT): Needs assistance ?Is this a change from baseline?: Pre-admission baseline ?Grooming: Needs assistance ?Is this a change from baseline?: Pre-admission baseline ?Feeding: Needs assistance ?Is this a change from baseline?: Pre-admission baseline ?Bathing: Needs assistance ?Is this a change from baseline?: Pre-admission  baseline ?Toileting: Needs assistance ?Is this a change from baseline?: Pre-admission baseline ?In/Out Bed: Needs assistance ?Is this a change from baseline?: Pre-admission baseline ?Walks in Home: Needs assistance ?Is this a change from baseline?: Pre-admission baseline ?Does the patient have difficulty walking or climbing stairs?: Yes ?Weakness of Legs: Both ?Weakness of Arms/Hands: None ?  ?Admission diagnosis:  Acute respiratory failure with hypercapnia ([place]) [J96.02] ?Acute hypercapnic respiratory failure ([place]) [J96.02] ?Patient Active Problem List  ? Diagnosis Date Noted  ? Suspicious nevus left buttock 03/19/2021  ? Acute hypercapnic respiratory failure ([place]) 03/16/2021  ? Hypothermia 03/16/2021  ? Chronic diastolic CHF (congestive heart failure) ([place]) 03/16/2021  ? Chronic respiratory failure with hypoxia and hypercapnia ([place]) 06/23/2020  ? Acute diastolic CHF (congestive heart failure) ([place]) 10/07/2019  ? Chronic low back pain   ? Goals of care, counseling/discussion   ? Palliative care by specialist   ? Acute respiratory failure with hypoxia and hypercapnia ([place]) 10/05/2019  ? AF (paroxysmal atrial fibrillation) ([place]) 10/05/2019  ? Acute on chronic respiratory failure with hypoxia and hypercapnia ([place]) 06/09/2019  ? Obesity, Class III, BMI 40-49.9 (morbid obesity) ([place]) 06/09/2019  ? Thrombocytopenia ([place]) 06/09/2019  ? 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 Pericardial effusion: Per 2 d echo 03/26/2015 03/27/2015  ? Acute on chronic respiratory failure ([place]) 03/26/2015  ? PNA (pneumonia) 03/26/2015  ? Nocturnal hypoxia 03/25/2015  ? Acute respiratory failure with hypoxia ([place]) 03/25/2015  ? COPD exacerbation ([place]) 03/25/2015  ? COPD (chronic obstructive pulmonary disease) ([place]) 03/25/2015  ? Anxiety   ? Hypothyroidism 03/03/2015  ? Vitamin D deficiency 03/03/2015  ? Hyperlipidemia 07/08/2007  ? CIGARETTE SMOKER 07/08/2007  ? Essential hypertension, benign 07/08/2007  ? EMPHYSEMA-on home O2 07/08/2007   ? Asthma 07/08/2007  ? COPD  GOLD ? / still smoking 07/08/2007  ? Sleep apnea 07/08/2007  ? WEIGHT GAIN 07/08/2007  ? ?PCP:  [facility] ?Pharmacy:   ?[facility] [address] ?[place] [address] ?Phone: [phone] Fax: [phone] ? ? ?Readmission Risk Interventions ?Readmission Risk Prevention Plan 03/20/2021 10/07/2019  ?Medication Screening Complete -  ?Transportation Screening Complete Complete  ?[place] or Home Care Consult - Complete  ?Social Work Consult for [place] Planning/Counseling - Complete  ?Palliative Care Screening - Complete  ?Medication Review Press photographer) - Complete  ?Some recent data might be hidden  ? ? ? ?

## 2021-03-20 NOTE — Progress Notes (Signed)
?PROGRESS NOTE ? ? ?Terri Casey  TMH:[id number] DOB: 21-Aug-1960 DOA: 03/16/2021 ?PCP: [facility]  ? ?Chief Complaint  ?Patient presents with  ? Altered Mental Status  ? ?Level of care: Telemetry ? ?Brief Admission History:  ? 61 y.o. female,  with medical history significant for COPD, asthma with chronic respiratory failure on home oxygen, OSA, DM, hypertension. ?-Patient with known history of COPD, chronic hypoxic respiratory failure on oxygen, she is still smoking, is lethargic but open eye and provide some history, history was obtained mainly from ED physician, patient with progressive dyspnea, cough, he was extremely short of breath when paramedics arrived, she received DuoNebs treatment, like with magnesium and steroids, without much improvement, so she was brought to ED, patient herself denies any fever, chills or any COVID exposures, she denies any chest pain, reports she is still smoking 1 pack lasts her 3 to 4 days.. ?-In ED patient was significantly dyspneic, hypoxic where she was started on BiPAP, ABG after 20 minutes on BiPAP showing pH of 7.24, PCO2 of 98, and PO2 of 81, BNP mildly elevated at 141, but no scar congestion or imaging, and no edema on physical exam, patient report feeling better after brief use of BiPAP and IV steroids, ED discussed with PCCM who will evaluate the patient as well, [facility] hospitalist consulted to admit. ?  ?03/17/2021:  Pt tolerated BiPAP well.  She has been weaned off BiPAP temporarily.  Tolerating diet.  Remains very short of breath.  Tolerating bronchodilators.  Patient says she is wanting to try to stop smoking but has been unable to.  Agreeable to nicotine patch. ? ?03/18/2021: Pt remains on bipap.  Severe SOB with only a few steps to bedside commode. Agreed to nicotine patch.   ? ?03/19/2021:  Pt says she has qualified for CPAP in past but never received equipment.  She was on bipap all night, now on .   ? ?03/20/2021: Remains on nightly  bipap.  Transfer out of SDU to telemetry.  TOC requested for NIV for home or bipap for home.   ?  ?Assessment and Plan: ?* Acute hypercapnic respiratory failure (HCC) ?Bipap treatment as ordered ?High risk for intubation (difficult airway) and trying to avoid intubation ? ?Suspicious nevus left buttock ?Pt was strongly advised to see a dermatologist as soon as possible to have it removed/sent for pathology and she verbalized understanding and said that she would follow up.   ? ?COPD exacerbation ([place]) ?IV steroids and bronchodilators.  Reducing IV steroids today.  ? ?Chronic diastolic CHF (congestive heart failure) ([place]) ?Resume home meds and monitor fluid volume status.  ?Appears well compensated and not fluid overloaded.  ? ?AF (paroxysmal atrial fibrillation) ([place]) ?Stable on home meds.  Following on monitor.  ? ?Acute respiratory failure with hypoxia ([place]) ?Following pulmonary recommendations ?Continue aggressive treatments as ordered.  ?Asked TOC about home bipap or NIV option.   ? ?Hypothyroidism ?Resumed home levothyroxine.  ? ?EMPHYSEMA-on home O2 ?Treating acute exacerbation ?Appreciate pulmonary consultation  ?TOC investigating for home NIV or bipap.  ? ?Essential hypertension, benign ?Stable on home meds.  ? ?CIGARETTE SMOKER ?Pt strongly advised to stop and nicotine patch ordered.  ? ?Hyperlipidemia ?Resumed home atrovastatin daily  ? ?DVT prophylaxis: rivaroxaban ?Code Status: Full  ?Family Communication:  ?Disposition: Status is: Inpatient ?Remains inpatient appropriate because: needs bipap, IV steroids, antibiotics... ?  ?Consultants:  ?pulmonary ?Procedures:  ?bipap ?Antimicrobials:  ?See MAR  ?Subjective: ?Pt using bipap nightly and doing  pretty good during the day.  Pt does not have a CpAP at home.  SOB improving.    ?Objective: ?Vitals:  ? 03/20/21 0825 03/20/21 0900 03/20/21 1000 03/20/21 1113  ?BP: (!) 115/53 (!) 122/52 (!) 119/51   ?Pulse: 80 82 77   ?Resp:  19 18   ?Temp:    98.2 ?F (36.8 ?C)   ?TempSrc:    Oral  ?SpO2:  94% 93%   ?Weight:      ?Height:      ? ? ?Intake/Output Summary (Last 24 hours) at 03/20/2021 1137 ?Last data filed at 03/20/2021 0034 ?Gross per 24 hour  ?Intake 460 ml  ?Output 300 ml  ?Net 160 ml  ? ?[place] Weights  ? 03/18/21 9179 03/19/21 0539 03/20/21 0331  ?Weight: 113.4 kg 111.1 kg 111.8 kg  ? ?Examination: ? ?General exam: Appears calm and comfortable  ?Respiratory system: better air movement but diffuse inspiratory/expiratory wheezing heard. No increased work of breathing noted.   ?Cardiovascular system: normal S1 & S2 heard. No JVD, murmurs, rubs, gallops or clicks. No pedal edema. ?Gastrointestinal system: Abdomen is nondistended, soft and nontender. No organomegaly or masses felt. Normal bowel sounds heard. ?Central nervous system: Alert and oriented. No focal neurological deficits. ?Extremities: Symmetric 5 x 5 power. ?Skin: black flat irregular shaped nevus left buttock. ?Psychiatry: Judgement and insight appear normal. Mood & affect appropriate.  ? ?Data Reviewed: I have personally reviewed following labs and imaging studies ? ?CBC: ?Recent Labs  ?Lab 03/16/21 ?1423 03/17/21 ?0412  ?WBC 10.4 4.9  ?NEUTROABS 7.8*  --   ?HGB 13.7 12.0  ?HCT 47.1* 41.5  ?MCV 112.4* 107.5*  ?PLT 177 149*  ? ? ?Basic Metabolic Panel: ?Recent Labs  ?Lab 03/16/21 ?1423 03/17/21 ?0412 03/20/21 ?0311  ?NA 141 142 139  ?K 4.4 4.5 4.5  ?CL 93* 96* 93*  ?CO2 40* 39* 38*  ?GLUCOSE 217* 128* 124*  ?BUN 21 28* 33*  ?CREATININE 0.78 0.67 0.79  ?CALCIUM 9.5 9.4 9.2  ? ? ?CBG: ?No results for input(s): GLUCAP in the last 168 hours. ? ?Recent Results (from the past 240 hour(s))  ?Blood Culture (routine x 2)     Status: None (Preliminary result)  ? Collection Time: 03/16/21  2:17 PM  ? Specimen: BLOOD  ?Result Value Ref Range Status  ? Specimen Description BLOOD BLOOD RIGHT HAND  Final  ? Special Requests   Final  ?  BOTTLES DRAWN AEROBIC ONLY Blood Culture results may not be optimal due to an inadequate volume  of blood received in culture bottles  ? Culture   Final  ?  NO GROWTH 3 DAYS ?Performed at [facility], [address] [place], [place] [address] ?  ? Report Status PENDING  Incomplete  ?Blood Culture (routine x 2)     Status: None (Preliminary result)  ? Collection Time: 03/16/21  2:23 PM  ? Specimen: BLOOD  ?Result Value Ref Range Status  ? Specimen Description BLOOD BLOOD LEFT FOREARM  Final  ? Special Requests   Final  ?  BOTTLES DRAWN AEROBIC AND ANAEROBIC Blood Culture adequate volume  ? Culture   Final  ?  NO GROWTH 3 DAYS ?Performed at [facility], [address] [place], [place] [address] ?  ? Report Status PENDING  Incomplete  ?Urine Culture     Status: Abnormal  ? Collection Time: 03/16/21  2:23 PM  ? Specimen: In/Out Cath Urine  ?Result Value Ref Range Status  ? Specimen Description   Final  ?  IN/OUT CATH URINE ?Performed at [facility], [address], [place], [place] [address] ?  ? Special Requests   Final  ?  NONE ?Performed at [facility], [address] [place], [place] [address] ?  ? Culture 40,000 COLONIES/mL ESCHERICHIA COLI (A)  Final  ? Report Status 03/19/2021 FINAL  Final  ? Organism ID, Bacteria ESCHERICHIA COLI (A)  Final  ?    Susceptibility  ? Escherichia coli - MIC*  ?  AMPICILLIN >=32 RESISTANT Resistant   ?  CEFAZOLIN <=4 SENSITIVE Sensitive   ?  CEFEPIME <=0.12 SENSITIVE Sensitive   ?  CEFTRIAXONE <=0.25 SENSITIVE Sensitive   ?  CIPROFLOXACIN <=0.25 SENSITIVE Sensitive   ?  GENTAMICIN <=1 SENSITIVE Sensitive   ?  IMIPENEM <=0.25 SENSITIVE Sensitive   ?  NITROFURANTOIN <=16 SENSITIVE Sensitive   ?  TRIMETH/SULFA <=20 SENSITIVE Sensitive   ?  AMPICILLIN/SULBACTAM 16 INTERMEDIATE Intermediate   ?  PIP/TAZO <=4 SENSITIVE Sensitive   ?  * 40,000 COLONIES/mL ESCHERICHIA COLI  ?Resp Panel by RT-PCR (Flu A&B, Covid) Nasopharyngeal Swab     Status: None  ? Collection Time: 03/16/21 10:05 PM  ? Specimen: Nasopharyngeal Swab; Nasopharyngeal(NP) swabs in vial transport medium  ?Result Value  Ref Range Status  ? SARS Coronavirus 2 by RT PCR NEGATIVE NEGATIVE Final  ?  Comment: (NOTE) ?SARS-CoV-2 target nucleic acids are NOT DETECTED. ? ?The SARS-CoV-2 RNA is generally detectable in upper

## 2021-03-21 ENCOUNTER — Telehealth: Payer: Self-pay

## 2021-03-21 DIAGNOSIS — J441 Chronic obstructive pulmonary disease with (acute) exacerbation: Secondary | ICD-10-CM | POA: Diagnosis not present

## 2021-03-21 DIAGNOSIS — I480 Paroxysmal atrial fibrillation: Secondary | ICD-10-CM | POA: Diagnosis not present

## 2021-03-21 DIAGNOSIS — I5032 Chronic diastolic (congestive) heart failure: Secondary | ICD-10-CM | POA: Diagnosis not present

## 2021-03-21 DIAGNOSIS — J9602 Acute respiratory failure with hypercapnia: Secondary | ICD-10-CM | POA: Diagnosis not present

## 2021-03-21 DIAGNOSIS — D229 Melanocytic nevi, unspecified: Secondary | ICD-10-CM

## 2021-03-21 LAB — CULTURE, BLOOD (ROUTINE X 2)
Culture: NO GROWTH
Culture: NO GROWTH
Special Requests: ADEQUATE

## 2021-03-21 MED ORDER — DOXYCYCLINE HYCLATE 100 MG PO CAPS: 100.0000 mg | ORAL_CAPSULE | Freq: Two times a day (BID) | ORAL | 0 refills | Status: AC

## 2021-03-21 MED ORDER — ALBUTEROL SULFATE (2.5 MG/3ML) 0.083% IN NEBU: 2.5000 mg | INHALATION_SOLUTION | Freq: Four times a day (QID) | RESPIRATORY_TRACT | 5 refills | Status: DC | PRN

## 2021-03-21 MED ORDER — PREDNISONE 20 MG PO TABS: ORAL_TABLET | ORAL | 0 refills | Status: DC

## 2021-03-21 MED ORDER — ALBUTEROL SULFATE HFA 108 (90 BASE) MCG/ACT IN AERS: INHALATION_SPRAY | RESPIRATORY_TRACT | 5 refills | Status: DC

## 2021-03-21 NOTE — Discharge Summary (Addendum)
Physician Discharge Summary  ?JERZEY KOMPERDA ASN:[id number] DOB: [birth year] DOA: 03/16/2021 ? ?PCP: [facility] ? ?Admit date: 03/16/2021 ?Discharge date: 03/21/2021 ? ?Admitted From:  Home  ?Disposition: Home with HH  ? ?Recommendations for Outpatient Follow-up:  ?Follow up with PCP in 1 weeks ?Follow up with [facility] in 2 weeks ?Pt was strongly advised to see a dermatologist or PCP asap regarding suspicious nevus on skin ?Home NIV being arranged  ? ?Home Health: PT, RN, SW  ? ?Discharge Condition: STABLE   ?CODE STATUS: FULL   ?DIET: resume prior home diet ? ?Brief Hospitalization Summary: ?Please see all hospital notes, images, labs for full details of the hospitalization. ? 61 y.o. female,  with medical history significant for COPD, asthma with chronic respiratory failure on home oxygen, OSA, DM, hypertension. ?-Patient with known history of COPD, chronic hypoxic respiratory failure on oxygen, she is still smoking, is lethargic but open eye and provide some history, history was obtained mainly from ED physician, patient with progressive dyspnea, cough, he was extremely short of breath when paramedics arrived, she received DuoNebs treatment, like with magnesium and steroids, without much improvement, so she was brought to ED, patient herself denies any fever, chills or any COVID exposures, she denies any chest pain, reports she is still smoking 1 pack lasts her 3 to 4 days.. ?-In ED patient was significantly dyspneic, hypoxic where she was started on BiPAP, ABG after 20 minutes on BiPAP showing pH of 7.24, PCO2 of 98, and PO2 of 81, BNP mildly elevated at 141, but no scar congestion or imaging, and no edema on physical exam, patient report feeling better after brief use of BiPAP and IV steroids, ED discussed with PCCM who will evaluate the patient as well, [facility] hospitalist consulted to admit. ?  ?03/17/2021:  Pt tolerated BiPAP well.  She has been weaned off BiPAP temporarily.   Tolerating diet.  Remains very short of breath.  Tolerating bronchodilators.  Patient says she is wanting to try to stop smoking but has been unable to.  Agreeable to nicotine patch. ? ?03/18/2021: Pt remains on bipap.  Severe SOB with only a few steps to bedside commode. Agreed to nicotine patch.   ? ?03/19/2021:  Pt says she has qualified for CPAP in past but never received equipment.  She was on bipap all night, now on [place].   ? ?03/20/2021: Remains on bipap.  Transfer out of SDU to telemetry.  TOC requested for NIV for home or bipap for home.   ? ?03/21/2021:  Pt requesting to discharge home.  Breathing much better with bipap.  Arrangements made and forms signed by MD for home NIV.  Pt to discharge home today.    ? ?Discharge Diagnoses:  ?Principal Problem: ?  Acute hypercapnic respiratory failure ([place]) ?Active Problems: ?  COPD exacerbation ([place]) ?  Suspicious nevus left buttock ?  Chronic diastolic CHF (congestive heart failure) ([place]) ?  Hyperlipidemia ?  CIGARETTE SMOKER ?  Essential hypertension, benign ?  EMPHYSEMA-on home O2 ?  Hypothyroidism ?  Acute respiratory failure with hypoxia ([place]) ?  AF (paroxysmal atrial fibrillation) ([place]) ? ? ?Discharge Instructions: ?Discharge Instructions   ? ? Ambulatory referral to Dermatology   Complete by: As directed ?  ? ?  ? ?Allergies as of 03/21/2021   ? ?   Reactions  ? Estrogens Hives  ? Mustard Seed Hives  ? Strawberry Extract Hives  ? ?  ? ?  ?Medication List  ?  ? ?  STOP taking these medications   ? ?Advair Diskus 500-50 MCG/ACT Aepb ?Generic drug: fluticasone-salmeterol ?  ?liothyronine 25 MCG tablet ?Commonly known as: CYTOMEL ?  ? ?  ? ?TAKE these medications   ? ?albuterol 108 (90 Base) MCG/ACT inhaler ?Commonly known as: ProAir HFA ?2 puffs every 4 hours as needed only  if your can't catch your breath ?  ?albuterol (2.5 MG/3ML) 0.083% nebulizer solution ?Commonly known as: PROVENTIL ?Take 3 mLs (2.5 mg total) by nebulization every 6 (six) hours as needed for  wheezing or shortness of breath. ?  ?amLODipine 10 MG tablet ?Commonly known as: NORVASC ?Take 10 mg by mouth daily. ?  ?atorvastatin 20 MG tablet ?Commonly known as: LIPITOR ?Take 20 mg by mouth at bedtime. ?  ?bethanechol 5 MG tablet ?Commonly known as: URECHOLINE ?Take 5 mg by mouth 2 (two) times daily. ?  ?clobetasol cream 0.05 % ?Commonly known as: TEMOVATE ?Apply topically. Apply as directed. ?  ?cyclobenzaprine 5 MG tablet ?Commonly known as: FLEXERIL ?Take 5 mg by mouth 3 (three) times daily. ?  ?doxycycline 100 MG capsule ?Commonly known as: VIBRAMYCIN ?Take 1 capsule (100 mg total) by mouth 2 (two) times daily for 3 days. ?  ?escitalopram 10 MG tablet ?Commonly known as: LEXAPRO ?Take 1 tablet by mouth daily. ?  ?folic acid 1 MG tablet ?Commonly known as: FOLVITE ?Take 1 tablet (1 mg total) by mouth daily. ?  ?furosemide 40 MG tablet ?Commonly known as: LASIX ?Take 1 tablet (40 mg total) by mouth daily. ?  ?levothyroxine 200 MCG tablet ?Commonly known as: SYNTHROID ?Take 1 tablet (200 mcg total) by mouth daily at 6 (six) AM. ?  ?loratadine 10 MG tablet ?Commonly known as: CLARITIN ?Take 10 mg by mouth daily. ?  ?metoprolol tartrate 25 MG tablet ?Commonly known as: LOPRESSOR ?Take 0.5 tablets (12.5 mg total) by mouth 2 (two) times daily. ?  ?oxyCODONE-acetaminophen 10-325 MG tablet ?Commonly known as: PERCOCET ?Take 1 tablet by mouth 4 (four) times daily as needed for pain. ?  ?pantoprazole 40 MG tablet ?Commonly known as: PROTONIX ?Take 1 tablet (40 mg total) by mouth daily. ?  ?predniSONE 20 MG tablet ?Commonly known as: DELTASONE ?Take 3 PO QAM x5days, 2 PO QAM x5days, 1 PO QAM x5days ?Start taking on: March 22, 2021 ?  ?Stiolto Respimat 2.5-2.5 MCG/ACT Aers ?Generic drug: Tiotropium Bromide-Olodaterol ?2 puffs  each am ?  ?topiramate 100 MG tablet ?Commonly known as: TOPAMAX ?Take 100 mg by mouth 2 (two) times daily. ?  ?Xarelto 20 MG Tabs tablet ?Generic drug: rivaroxaban ?Take 20 mg by mouth daily. ?   ? ?  ? ? Follow-up Information   ? ? [facility] Schedule an appointment as soon as possible for a visit in 1 week(s).   ?Why: Hospital Follow Up ?Contact information: ?[address] ?[place] [address] ?[phone] ? ? ?  ?  ? ? [facility]. Schedule an appointment as soon as possible for a visit in 2 week(s).   ?Specialty: Pulmonology ?Why: Hospital Follow Up ?Contact information: ?[address], [address] ?[place] [address] ?[phone] ? ?  ?  ? ?  ?  ? ?  ? ?Allergies  ?Allergen Reactions  ? Estrogens Hives  ? Mustard Seed Hives  ? Strawberry Extract Hives  ? ?Allergies as of 03/21/2021   ? ?   Reactions  ? Estrogens Hives  ? Mustard Seed Hives  ? Strawberry Extract Hives  ? ?  ? ?  ?  Medication List  ?  ? ?STOP taking these medications   ? ?Advair Diskus 500-50 MCG/ACT Aepb ?Generic drug: fluticasone-salmeterol ?  ?liothyronine 25 MCG tablet ?Commonly known as: CYTOMEL ?  ? ?  ? ?TAKE these medications   ? ?albuterol 108 (90 Base) MCG/ACT inhaler ?Commonly known as: ProAir HFA ?2 puffs every 4 hours as needed only  if your can't catch your breath ?  ?albuterol (2.5 MG/3ML) 0.083% nebulizer solution ?Commonly known as: PROVENTIL ?Take 3 mLs (2.5 mg total) by nebulization every 6 (six) hours as needed for wheezing or shortness of breath. ?  ?amLODipine 10 MG tablet ?Commonly known as: NORVASC ?Take 10 mg by mouth daily. ?  ?atorvastatin 20 MG tablet ?Commonly known as: LIPITOR ?Take 20 mg by mouth at bedtime. ?  ?bethanechol 5 MG tablet ?Commonly known as: URECHOLINE ?Take 5 mg by mouth 2 (two) times daily. ?  ?clobetasol cream 0.05 % ?Commonly known as: TEMOVATE ?Apply topically. Apply as directed. ?  ?cyclobenzaprine 5 MG tablet ?Commonly known as: FLEXERIL ?Take 5 mg by mouth 3 (three) times daily. ?  ?doxycycline 100 MG capsule ?Commonly known as: VIBRAMYCIN ?Take 1 capsule (100 mg total) by mouth 2 (two) times daily for 3 days. ?  ?escitalopram  10 MG tablet ?Commonly known as: LEXAPRO ?Take 1 tablet by mouth daily. ?  ?folic acid 1 MG tablet ?Commonly known as: FOLVITE ?Take 1 tablet (1 mg total) by mouth daily. ?  ?furosemide 40 MG tablet ?Commo

## 2021-03-21 NOTE — Progress Notes (Signed)
Physical Therapy Treatment ?Patient Details ?Name: Terri Casey ?MRN: [id number] ?DOB: [birth year] ?Today's Date: 03/21/2021 ? ? ?History of Present Illness Terri Casey  is a 61 y.o. female,  with medical history significant for COPD, asthma with chronic respiratory failure on home oxygen, OSA, DM, hypertension.  -Patient with known history of COPD, chronic hypoxic respiratory failure on oxygen, she is still smoking, is lethargic but open eye and provide some history, history was obtained mainly from ED physician, patient with progressive dyspnea, cough, he was extremely short of breath when paramedics arrived, she received DuoNebs treatment, like with magnesium and steroids, without much improvement, so she was brought to ED, patient herself denies any fever, chills or any COVID exposures, she denies any chest pain, reports she is still smoking 1 pack lasts her 3 to 4 days..  -In ED patient was significantly dyspneic, hypoxic where she was started on BiPAP, ABG after 20 minutes on BiPAP showing pH of 7.24, PCO2 of 98, and PO2 of 81, BNP mildly elevated at 141, but no scar congestion or imaging, and no edema on physical exam, patient report feeling better after brief use of BiPAP and IV steroids, ED discussed with PCCM who will evaluate the patient as well, [facility] hospitalist consulted to admit. ? ?  ?PT Comments  ? ? Pt in bed getting a breathing treatment.  Willing to participate with PT today.  Pt overall completes bed mobility indpendently but with increased time due to weakness and shortness of breath.  Pt stood with supervision/contact guard, ambulated to restroom to void (hygiene independently) and ambulated back to chair.  Pt required more assist to stand from commode as it was not at a raised height.  Pt without any LOB or complaints during session but with noted dyspnea /shortness of breath with all activity.  PT remained on oxygen via [place] during entire session. ?   ?Recommendations for follow up  therapy are one component of a multi-disciplinary discharge planning process, led by the attending physician.  Recommendations may be updated based on patient status, additional functional criteria and insurance authorization. ? ?Follow Up Recommendations ? Home health PT ?  ?  ?Assistance Recommended at Discharge Set up Supervision/Assistance  ?Patient can return home with the following A little help with walking and/or transfers;A little help with bathing/dressing/bathroom;Help with stairs or ramp for entrance;Assistance with cooking/housework ?  ?Equipment Recommendations ? None recommended by PT  ?  ?   ?   ? ?Mobility ? Bed Mobility ?Overal bed mobility: Modified Independent ?  ?  ?  ?  ?  ?  ?  ?  ? ?Transfers ?Overall transfer level: Needs assistance ?Equipment used: Rolling walker (2 wheels) ?Transfers: Sit to/from Stand ?Sit to Stand: Min guard ?  ?  ?  ?  ?  ?  ?  ? ?Ambulation/Gait ?Ambulation/Gait assistance: Supervision ?Gait Distance (Feet): 30 Feet ?Assistive device: Rolling walker (2 wheels), None ?Gait Pattern/deviations: WFL(Within Functional Limits) ?  ?Gait velocity interpretation: <1.31 ft/sec, indicative of household ambulator ?  ?  ? ? ? ? ?  ?   ?Cognition Arousal/Alertness: Awake/alert ?Behavior During Therapy: [facility] for tasks assessed/performed ?Overall Cognitive Status: Within Functional Limits for tasks assessed ?  ?  ?  ?  ?  ?  ?  ?  ?  ?  ?  ?  ?  ?  ?  ?  ?  ?  ?  ? ?  ?   ?   ? ?Pertinent Vitals/Pain  Pain Assessment ?Pain Assessment: No/denies pain  ? ? ? ?PT Goals (current goals can now be found in the care plan section)   ? ?  ?Frequency ? ? ? Min 3X/week ? ? ? ?  ?PT Plan Current plan remains appropriate  ? ? ?   ?AM-PAC PT "6 Clicks" Mobility   ?Outcome Measure ? Help needed turning from your back to your side while in a flat bed without using bedrails?: None ?Help needed moving from lying on your back to sitting on the side of a flat bed without using bedrails?: None ?Help needed  moving to and from a bed to a chair (including a wheelchair)?: A Little ?Help needed standing up from a chair using your arms (e.g., wheelchair or bedside chair)?: A Little ?Help needed to walk in hospital room?: A Little ?Help needed climbing 3-5 steps with a railing? : A Lot ?6 Click Score: 19 ? ?  ?End of Session   ?Activity Tolerance: Patient tolerated treatment well;Patient limited by fatigue;Other (comment) (SOB with SpO2 dropping) ?Patient left: in chair ?Nurse Communication: Mobility status ?PT Visit Diagnosis: Unsteadiness on feet (R26.81);Other abnormalities of gait and mobility (R26.89);Muscle weakness (generalized) (M62.81) ?  ? ? ?Time: 6503-5465 ?PT Time Calculation (min) (ACUTE ONLY): 23 min ? ?Charges:  $Therapeutic Exercise: 23-37 mins          ?          ?Reiko Vinje Sula Soda, PTA/CLT, WTA ?[phone] ? ? ? ?Mare Ferrari, Lorence Nagengast B ?03/21/2021, 1:21 PM ? ?

## 2021-03-21 NOTE — Telephone Encounter (Signed)
Received urgent referral from hospital today regarding nevus on buttock. Scheduled patient but she declined appt at this time. She will call back to reschedule when she is ready.  ?

## 2021-03-21 NOTE — TOC Transition Note (Signed)
Transition of Care (TOC) - CM/SW Discharge Note ? ? ?Patient Details  ?Name: LORRAINA SPRING ?MRN: [id number] ?Date of Birth: 08/04/1960 ? ?Transition of Care (TOC) CM/SW Contact:  ?Boneta Lucks, RN ?Phone Number: ?03/21/2021, 11:34 AM ? ? ?Clinical Narrative:   Patient discharging home today. Caryl Pina with [employer] finishing orders for NIV, MD has signed and completed narrative as needed.  ? ?Final next level of care: Home/Self Care ?Barriers to Discharge: Barriers Resolved ? ?Patient Goals and CMS Choice ?Patient states their goals for this hospitalization and ongoing recovery are:: to go home. ?CMS Medicare.gov Compare Post Acute Care list provided to:: Patient ?Choice offered to / list presented to : Patient ? ?Discharge Placement ?  ?            ?Patient and family notified of of transfer: 03/21/21 ? ?Discharge Plan and Services ?  ?   ?Date DME Agency Contacted: 03/20/21 ?Time DME Agency Contacted: 1200 ?Representative spoke with at DME Agency: Caryl Pina ?  ?   ?Readmission Risk Interventions ?Readmission Risk Prevention Plan 03/21/2021 03/20/2021 10/07/2019  ?Post Dischage Appt Complete - -  ?Medication Screening Complete Complete -  ?Transportation Screening Complete Complete Complete  ?[place] or Home Care Consult - - Complete  ?Social Work Consult for [place] Planning/Counseling - - Complete  ?Palliative Care Screening - - Complete  ?Medication Review Press photographer) - - Complete  ?Some recent data might be hidden  ? ? ? ? ? ?

## 2021-03-21 NOTE — Progress Notes (Signed)
?  Pt has severe chronic respiratory failure due to severe COPD with chronic hypercapnic resp failure. Pt requires frequent durations of respiratory support and deteriorates quickly in the absence of non-invasive mechanical ventilator. Pt's PC02 was 98 on 03/16/21. Interruption or failure to provide NIMV would quickly lead to exacerbation of the patient's condition, lead to hospitalization and likely harm the patient. Bilevel device unable to adequately support their nocturnal ventilation needs. Patient requires the use of NIV both QHS and daytime to help with exacerbation periods. The use of the NIV will treat the patient's high PCO2 levels and can reduce risk of exacerbations and future hospitalizations when used at night and during the day. Patient would benefit from NIV therapy with set tidal volumes and pressure support as BIPAP will not be able to accommodate these advanced settings. Continued use of the NIMV is preferred. Patient can clear secretions and protect their airway  ? ?C. Marisa Hua, MD ?How to contact the [facility] Attending or Consulting provider [place] or covering provider during after hours [place], for this patient?  ?Check the care team in [facility] and look for a) attending/consulting [facility] provider listed and b) the [facility] team listed ?Log into [email or web address] and use [place]'s universal password to access. If you do not have the password, please contact the hospital operator. ?Locate the [facility] provider you are looking for under [facility] and page to a number that you can be directly reached. ?If you still have difficulty reaching the provider, please page the [facility] (Director on Call) for the Hospitalists listed on amion for assistance. ? ?

## 2021-03-21 NOTE — Discharge Instructions (Signed)
IMPORTANT INFORMATION: PAY CLOSE ATTENTION   PHYSICIAN DISCHARGE INSTRUCTIONS  Follow with Primary care provider  [facility]  and other consultants as instructed by your Hospitalist Physician  SEEK MEDICAL CARE OR RETURN TO EMERGENCY ROOM IF SYMPTOMS COME BACK, WORSEN OR NEW PROBLEM DEVELOPS   Please note: You were cared for by a hospitalist during your hospital stay. Every effort will be made to forward records to your primary care provider.  You can request that your primary care provider send for your hospital records if they have not received them.  Once you are discharged, your primary care physician will handle any further medical issues. Please note that NO REFILLS for any discharge medications will be authorized once you are discharged, as it is imperative that you return to your primary care physician (or establish a relationship with a primary care physician if you do not have one) for your post hospital discharge needs so that they can reassess your need for medications and monitor your lab values.  Please get a complete blood count and chemistry panel checked by your Primary MD at your next visit, and again as instructed by your Primary MD.  Get Medicines reviewed and adjusted: Please take all your medications with you for your next visit with your Primary MD  Laboratory/radiological data: Please request your Primary MD to go over all hospital tests and procedure/radiological results at the follow up, please ask your primary care provider to get all Hospital records sent to his/her office.  In some cases, they will be blood work, cultures and biopsy results pending at the time of your discharge. Please request that your primary care provider follow up on these results.  If you are diabetic, please bring your blood sugar readings with you to your follow up appointment with primary care.    Please call and make your follow up appointments as soon as  possible.    Also Note the following: If you experience worsening of your admission symptoms, develop shortness of breath, life threatening emergency, suicidal or homicidal thoughts you must seek medical attention immediately by calling 911 or calling your MD immediately  if symptoms less severe.  You must read complete instructions/literature along with all the possible adverse reactions/side effects for all the Medicines you take and that have been prescribed to you. Take any new Medicines after you have completely understood and accpet all the possible adverse reactions/side effects.   Do not drive when taking Pain medications or sleeping medications (Benzodiazepines)  Do not take more than prescribed Pain, Sleep and Anxiety Medications. It is not advisable to combine anxiety,sleep and pain medications without talking with your primary care practitioner  Special Instructions: If you have smoked or chewed Tobacco  in the last 2 yrs please stop smoking, stop any regular Alcohol  and or any Recreational drug use.  Wear Seat belts while driving.  Do not drive if taking any narcotic, mind altering or controlled substances or recreational drugs or alcohol.       

## 2021-03-21 NOTE — Progress Notes (Signed)
Patient discharged home today, transported home by family. Discharge paperwork went over with patient, patient verbalized understanding. Belongings sent home with patient. [employer] [facility] dropped of oxygen tank for patient to have during transport home. ?

## 2021-04-03 ENCOUNTER — Ambulatory Visit: Payer: Medicaid Other | Admitting: Dermatology

## 2021-07-25 ENCOUNTER — Ambulatory Visit: Payer: Medicaid Other | Admitting: Audiologist

## 2021-08-01 ENCOUNTER — Ambulatory Visit: Payer: Medicaid Other | Admitting: Audiologist

## 2021-08-29 ENCOUNTER — Emergency Department (HOSPITAL_COMMUNITY)
Admission: EM | Admit: 2021-08-29 | Discharge: 2021-08-29 | Disposition: A | Discharge status: Home / Self Care | Payer: Medicaid Other | Attending: Emergency Medicine | Admitting: Emergency Medicine

## 2021-08-29 ENCOUNTER — Emergency Department (HOSPITAL_COMMUNITY): Payer: Medicaid Other

## 2021-08-29 ENCOUNTER — Encounter (HOSPITAL_COMMUNITY): Payer: Self-pay | Admitting: *Deleted

## 2021-08-29 ENCOUNTER — Other Ambulatory Visit: Payer: Self-pay

## 2021-08-29 DIAGNOSIS — Z20822 Contact with and (suspected) exposure to covid-19: Secondary | ICD-10-CM | POA: Insufficient documentation

## 2021-08-29 DIAGNOSIS — W19XXXA Unspecified fall, initial encounter: Secondary | ICD-10-CM | POA: Insufficient documentation

## 2021-08-29 DIAGNOSIS — M7989 Other specified soft tissue disorders: Secondary | ICD-10-CM | POA: Diagnosis not present

## 2021-08-29 DIAGNOSIS — M25572 Pain in left ankle and joints of left foot: Secondary | ICD-10-CM | POA: Diagnosis present

## 2021-08-29 DIAGNOSIS — F17200 Nicotine dependence, unspecified, uncomplicated: Secondary | ICD-10-CM | POA: Insufficient documentation

## 2021-08-29 DIAGNOSIS — Z7901 Long term (current) use of anticoagulants: Secondary | ICD-10-CM | POA: Insufficient documentation

## 2021-08-29 DIAGNOSIS — Z79899 Other long term (current) drug therapy: Secondary | ICD-10-CM | POA: Diagnosis not present

## 2021-08-29 DIAGNOSIS — M79672 Pain in left foot: Secondary | ICD-10-CM | POA: Insufficient documentation

## 2021-08-29 DIAGNOSIS — M25472 Effusion, left ankle: Secondary | ICD-10-CM | POA: Diagnosis not present

## 2021-08-29 DIAGNOSIS — S99922A Unspecified injury of left foot, initial encounter: Secondary | ICD-10-CM

## 2021-08-29 DIAGNOSIS — R0602 Shortness of breath: Secondary | ICD-10-CM | POA: Diagnosis not present

## 2021-08-29 LAB — COMPREHENSIVE METABOLIC PANEL
ALT: 11 U/L (ref 0–44)
AST: 19 U/L (ref 15–41)
Albumin: 3.7 g/dL (ref 3.5–5.0)
Alkaline Phosphatase: 119 U/L (ref 38–126)
Anion gap: 14 (ref 5–15)
BUN: 12 mg/dL (ref 8–23)
CO2: 36 mmol/L — ABNORMAL HIGH (ref 22–32)
Calcium: 9 mg/dL (ref 8.9–10.3)
Chloride: 91 mmol/L — ABNORMAL LOW (ref 98–111)
Creatinine, Ser: 0.76 mg/dL (ref 0.44–1.00)
GFR, Estimated: >60 mL/min (ref >60)
Glucose, Bld: 128 mg/dL — ABNORMAL HIGH (ref 70–99)
Potassium: 4.1 mmol/L (ref 3.5–5.1)
Sodium: 141 mmol/L (ref 135–145)
Total Bilirubin: 0.6 mg/dL (ref 0.3–1.2)
Total Protein: 7.4 g/dL (ref 6.5–8.1)

## 2021-08-29 LAB — RESP PANEL BY RT-PCR (FLU A&B, COVID) ARPGX2
Influenza A by PCR: NEGATIVE
Influenza B by PCR: NEGATIVE
SARS Coronavirus 2 by RT PCR: NEGATIVE

## 2021-08-29 LAB — CBC WITH DIFFERENTIAL/PLATELET
Abs Immature Granulocytes: 0.02 10*3/uL (ref 0.00–0.07)
Basophils Absolute: 0 10*3/uL (ref 0.0–0.1)
Basophils Relative: 0 %
Eosinophils Absolute: 0 10*3/uL (ref 0.0–0.5)
Eosinophils Relative: 0 %
HCT: 45.9 % (ref 36.0–46.0)
Hemoglobin: 13.3 g/dL (ref 12.0–15.0)
Immature Granulocytes: 0 %
Lymphocytes Relative: 7 %
Lymphs Abs: 0.5 10*3/uL — ABNORMAL LOW (ref 0.7–4.0)
MCH: 31.6 pg (ref 26.0–34.0)
MCHC: 29 g/dL — ABNORMAL LOW (ref 30.0–36.0)
MCV: 109 fL — ABNORMAL HIGH (ref 80.0–100.0)
Monocytes Absolute: 0.2 10*3/uL (ref 0.1–1.0)
Monocytes Relative: 3 %
Neutro Abs: 6.8 10*3/uL (ref 1.7–7.7)
Neutrophils Relative %: 90 %
Platelets: 122 10*3/uL — ABNORMAL LOW (ref 150–400)
RBC: 4.21 MIL/uL (ref 3.87–5.11)
RDW: 14.1 % (ref 11.5–15.5)
WBC: 7.6 10*3/uL (ref 4.0–10.5)
nRBC: 0 % (ref 0.0–0.2)

## 2021-08-29 LAB — BRAIN NATRIURETIC PEPTIDE: B Natriuretic Peptide: 80 pg/mL (ref 0.0–100.0)

## 2021-08-29 MED ORDER — BACITRACIN ZINC 500 UNIT/GM EX OINT
TOPICAL_OINTMENT | Freq: Two times a day (BID) | CUTANEOUS | Status: DC
  Filled 2021-08-29: qty 1.8

## 2021-08-29 MED ORDER — FENTANYL CITRATE PF 50 MCG/ML IJ SOSY
50.0000 ug | PREFILLED_SYRINGE | Freq: Once | INTRAMUSCULAR | Status: AC
  Administered 2021-08-29: 50 ug via INTRAVENOUS
  Filled 2021-08-29: qty 1

## 2021-08-29 NOTE — ED Triage Notes (Signed)
Pt brought in by [facility] for c/o fall and sob; pt given solumedrol and 2 duonebs en route by ems; sats increased to 99%  Pt has pain to left foot after fall at 1am  No deformity noted

## 2021-08-29 NOTE — ED Notes (Signed)
Patients right great and second toe wrapped with bacitracin, telfa, and kling. Cam boot applied to left foot.

## 2021-08-29 NOTE — ED Provider Notes (Signed)
[facility] EMERGENCY DEPARTMENT Provider Note   CSN: [id number] Arrival date & time: 08/29/21  1259     History  Chief Complaint  Patient presents with   Shortness of Breath    Terri Casey is a 61 y.o. female.  HPI 61 year old female presents with a fall and left foot/ankle injury.  Patient was smoking last night and when she came inside her legs gave out and she fell.  She ripped off her great toenail on the right side but otherwise has minimal pain there.  However the left foot is swollen and the ankle is painful.  She is also more short of breath than typical though she has chronic dyspnea and is chronically on oxygen.  Pain is severe in her left foot.  Home Medications Prior to Admission medications   Medication Sig Start Date End Date Taking? Authorizing Provider  albuterol (PROAIR HFA) 108 (90 Base) MCG/ACT inhaler 2 puffs every 4 hours as needed only  if your can't catch your breath 03/21/21   Wynetta Emery, Clanford L, MD  albuterol (PROVENTIL) (2.5 MG/3ML) 0.083% nebulizer solution Take 3 mLs (2.5 mg total) by nebulization every 6 (six) hours as needed for wheezing or shortness of breath. 03/21/21   Johnson, Clanford L, MD  amLODipine (NORVASC) 10 MG tablet Take 10 mg by mouth daily. 02/28/21   [provider]  atorvastatin (LIPITOR) 20 MG tablet Take 20 mg by mouth at bedtime.     [provider]  bethanechol (URECHOLINE) 5 MG tablet Take 5 mg by mouth 2 (two) times daily. 08/06/19   [provider]  clobetasol cream (TEMOVATE) 0.05 % Apply topically. Apply as directed. 03/15/20   [provider]  cyclobenzaprine (FLEXERIL) 5 MG tablet Take 5 mg by mouth 3 (three) times daily. 02/23/19   [provider]  escitalopram (LEXAPRO) 10 MG tablet Take 1 tablet by mouth daily. 05/26/20   [provider]  folic acid (FOLVITE) 1 MG tablet Take 1 tablet (1 mg total) by mouth daily. 10/11/19   Orson Eva, MD  furosemide (LASIX) 40 MG tablet  Take 1 tablet (40 mg total) by mouth daily. 10/11/19   Orson Eva, MD  levothyroxine (SYNTHROID) 200 MCG tablet Take 1 tablet (200 mcg total) by mouth daily at 6 (six) AM. 06/20/19   Swayze, Ava, DO  loratadine (CLARITIN) 10 MG tablet Take 10 mg by mouth daily.    [provider]  metoprolol tartrate (LOPRESSOR) 25 MG tablet Take 0.5 tablets (12.5 mg total) by mouth 2 (two) times daily. 10/10/19   Orson Eva, MD  oxyCODONE-acetaminophen (PERCOCET) 10-325 MG tablet Take 1 tablet by mouth 4 (four) times daily as needed for pain. 07/24/19   [provider]  pantoprazole (PROTONIX) 40 MG tablet Take 1 tablet (40 mg total) by mouth daily. 06/20/19   Swayze, Ava, DO  predniSONE (DELTASONE) 20 MG tablet Take 3 PO QAM x5days, 2 PO QAM x5days, 1 PO QAM x5days 03/22/21   Murlean Iba, MD  STIOLTO RESPIMAT 2.5-2.5 MCG/ACT AERS 2 puffs  each am 06/23/20   Tanda Rockers, MD  topiramate (TOPAMAX) 100 MG tablet Take 100 mg by mouth 2 (two) times daily. 06/01/20   [provider]  XARELTO 20 MG TABS tablet Take 20 mg by mouth daily. 05/08/19   [provider]      Allergies    Estrogens, Mustard seed, and Strawberry extract    Review of Systems   Review of Systems  Respiratory:  Positive for shortness of breath.   Musculoskeletal:  Positive for arthralgias and joint swelling.    Physical Exam Updated Vital Signs BP (!) 126/92   Pulse 77   Temp 98.3 F (36.8 C) (Oral)   Resp 17   Ht '5\' 9"'$  (1.753 m)   Wt 102.1 kg   SpO2 93%   BMI 33.23 kg/m  Physical Exam Vitals and nursing note reviewed.  Constitutional:      Appearance: She is well-developed. She is obese.  HENT:     Head: Normocephalic and atraumatic.  Cardiovascular:     Rate and Rhythm: Normal rate and regular rhythm.     Pulses:          Dorsalis pedis pulses are 2+ on the right side and 2+ on the left side.     Heart sounds: Normal heart sounds.  Pulmonary:     Effort: Pulmonary effort is normal.      Breath sounds: Normal breath sounds. No wheezing.  Abdominal:     Palpations: Abdomen is soft.     Tenderness: There is no abdominal tenderness.  Musculoskeletal:     Right ankle: No tenderness. Normal range of motion.     Left ankle: Swelling present. Tenderness present. Decreased range of motion.     Right foot: No tenderness.     Left foot: Decreased range of motion. Swelling and tenderness present.     Comments: Right great toe nail is completely avulsed  Skin:    General: Skin is warm and dry.  Neurological:     Mental Status: She is alert.     ED Results / Procedures / Treatments   Labs (all labs ordered are listed, but only abnormal results are displayed) Labs Reviewed  RESP PANEL BY RT-PCR (FLU A&B, COVID) [id number]  COMPREHENSIVE METABOLIC PANEL  CBC WITH DIFFERENTIAL/PLATELET  BRAIN NATRIURETIC PEPTIDE    EKG None  Radiology DG Foot Complete Left  Result Date: 08/29/2021 CLINICAL DATA:  A 61 year old female presents with foot pain after fall. EXAM: LEFT FOOT - COMPLETE 3+ VIEW COMPARISON:  Ankle evaluation of the same date. FINDINGS: Distal aspects of the fourth and fifth metatarsal with nondisplaced fractures suggested particularly about the fourth metatarsal head. Lateral view is incomplete and obtained with the ankle radiographs due to patient condition by report. No additional fractures are seen on AP and oblique projections of the foot or on the partial lateral view of the foot obtained with the ankle. IMPRESSION: Fracture of the fourth metatarsal near the head of the fourth metatarsal. Potential fracture of the adjacent fifth metatarsal at the head. Limited evaluation due to lack of true lateral projection. When patient condition improves could consider repeat imaging for further assessment to evaluate for angulation of fractures about the forefoot and other signs of subtle injury either to the forefoot or calcaneus which was partially excluded from view along  posterior calcaneus when assessing the ankle. Electronically Signed   By: Zetta Bills M.D.   On: 08/29/2021 15:16   DG Chest Port 1 View  Result Date: 08/29/2021 CLINICAL DATA:  Shortness of breath EXAM: PORTABLE CHEST 1 VIEW COMPARISON:  03/16/2021 FINDINGS: Stable cardiomegaly. Prominent perihilar and bibasilar interstitial markings. No lobar consolidation. No pleural effusion or pneumothorax. IMPRESSION: Cardiomegaly with prominent perihilar and bibasilar interstitial markings. Findings may reflect CHF with interstitial edema. Electronically Signed   By: Davina Poke D.O.   On: 08/29/2021 15:13   DG Ankle Complete Left  Result Date: 08/29/2021 CLINICAL DATA:  w a 61 year old female presents for evaluation of fall with foot injury. EXAM: LEFT ANKLE COMPLETE - 3+ VIEW COMPARISON:  Foot evaluation of the same date. FINDINGS: Degenerative changes about the ankle. Soft tissue swelling about the ankle and foot to the extent evaluated on the ankle evaluation. Bony fragment anterior to the navicular and superior to the navicular on lateral projection. Mild soft tissue swelling in the area. Imaging limited by report due to patient condition. A portion of the calcaneus is excluded from view on the lateral projection. IMPRESSION: 1. Query navicular avulsion fracture. Correlate with point tenderness over the midfoot and anterior ankle over the dorsum of the ankle. 2. Soft tissue swelling. 3. Limited evaluation due to patient condition as discussed. A portion of the posterior calcaneus is excluded from view. Electronically Signed   By: Zetta Bills M.D.   On: 08/29/2021 15:12   DG Foot Complete Right  Result Date: 08/29/2021 CLINICAL DATA:  Pain post fall EXAM: RIGHT FOOT COMPLETE - 3+ VIEW COMPARISON:  None Available. FINDINGS: There is no evidence of fracture or dislocation. There is no evidence of arthropathy or other focal bone abnormality. Soft tissues are unremarkable. IMPRESSION: Negative.  Electronically Signed   By: Lucrezia Europe M.D.   On: 08/29/2021 15:10    Procedures Procedures    Medications Ordered in ED Medications  bacitracin ointment (has no administration in time range)  fentaNYL (SUBLIMAZE) injection 50 mcg (50 mcg Intravenous Given 08/29/21 1351)    ED Course/ Medical Decision Making/ A&P                           Medical Decision Making Amount and/or Complexity of Data Reviewed Labs: ordered. Radiology: ordered.  Risk OTC drugs. Prescription drug management.   Patient's x-rays reviewed.  No lobar pneumonia on the x-ray but possible CHF.  However her foot x-ray is concerning for a avulsion navicular fracture.  We will put in a cam boot.  Labs are currently pending for eval of CHF and other electrolyte disturbance that could cause her to feel generally weak and fall.  Of note, her right great toenail has completely been removed from the fall yesterday.  Offered to try and stented open to help facilitate the next nail growing but patient has declined.  Patient has a history of hypercarbic respiratory failure in the past but right now is awake, alert, and does not appear lethargic or altered.  I think this is less likely today.  Care transferred to Dr. Roderic Palau.         Final Clinical Impression(s) / ED Diagnoses Final diagnoses:  None    Rx / DC Orders ED Discharge Orders     None         Sherwood Gambler, MD 08/29/21 1546

## 2021-08-29 NOTE — Discharge Instructions (Signed)
Follow-up with Dr. Amedeo Kinsman is the end of this week.  Next week

## 2021-08-29 NOTE — ED Notes (Addendum)
Patient alert to voice but is now falling asleep in conversation. Patient educated on trying to stay awake and breathe through her nose. Oxygen saturation 79% on 2L/[place] and oxygen turned up. Patient is opening her mouth to breathe, oxygen placed in mouth and oxygen requirement turned up to 5L/[place]. Dr. Regenia Skeeter made aware; awaiting orders.

## 2021-08-29 NOTE — ED Notes (Signed)
Daughter in law, [name], updated at this time for patient care and outcome of testing. Family member verbalized understanding.

## 2021-08-29 NOTE — ED Notes (Signed)
Patient increased work of breathing with any movement and oxygen desaturation to 88% on her normal 2L/[place]

## 2021-08-29 NOTE — ED Notes (Signed)
Daughter [name] states that she would be here in 1 hour to get the patient.

## 2021-09-05 ENCOUNTER — Ambulatory Visit (INDEPENDENT_AMBULATORY_CARE_PROVIDER_SITE_OTHER): Payer: Medicaid Other | Admitting: Orthopedic Surgery

## 2021-09-05 ENCOUNTER — Encounter: Payer: Self-pay | Admitting: Orthopedic Surgery

## 2021-09-05 VITALS — BP 118/54 | HR 78 | Ht 69.0 in | Wt 225.0 lb

## 2021-09-05 DIAGNOSIS — S92345A Nondisplaced fracture of fourth metatarsal bone, left foot, initial encounter for closed fracture: Secondary | ICD-10-CM | POA: Diagnosis not present

## 2021-09-05 DIAGNOSIS — M8580 Other specified disorders of bone density and structure, unspecified site: Secondary | ICD-10-CM | POA: Insufficient documentation

## 2021-09-05 DIAGNOSIS — G4761 Periodic limb movement disorder: Secondary | ICD-10-CM | POA: Insufficient documentation

## 2021-09-05 DIAGNOSIS — J309 Allergic rhinitis, unspecified: Secondary | ICD-10-CM | POA: Insufficient documentation

## 2021-09-05 DIAGNOSIS — R0689 Other abnormalities of breathing: Secondary | ICD-10-CM | POA: Insufficient documentation

## 2021-09-05 DIAGNOSIS — G8929 Other chronic pain: Secondary | ICD-10-CM | POA: Insufficient documentation

## 2021-09-05 DIAGNOSIS — E063 Autoimmune thyroiditis: Secondary | ICD-10-CM | POA: Insufficient documentation

## 2021-09-05 DIAGNOSIS — S92355A Nondisplaced fracture of fifth metatarsal bone, left foot, initial encounter for closed fracture: Secondary | ICD-10-CM | POA: Diagnosis not present

## 2021-09-05 MED ORDER — HYDROCODONE-ACETAMINOPHEN 5-325 MG PO TABS: 1.0000 | ORAL_TABLET | Freq: Four times a day (QID) | ORAL | 0 refills | Status: DC | PRN

## 2021-09-05 NOTE — Patient Instructions (Signed)
Wear the boot on left foot for support  Okay to remove for hygiene  You can bear weight through your heel  Elevate your foot to help with swelling and pain control  Medications as needed  Follow-up in 2 weeks

## 2021-09-05 NOTE — Progress Notes (Signed)
New Patient Visit  Assessment: Terri Casey is a 61 y.o. female with the following: 1. Closed nondisplaced fracture of fourth metatarsal bone of left foot, initial encounter 2. Closed nondisplaced fracture of fifth metatarsal bone of left foot, initial encounter  Plan: Aleksandra A Tomaszewski fell and sustained fractures to the lateral aspect of the left foot.  These are nondisplaced.  On physical exam, she has a lot of swelling and bruising.  Recommended that she continue to wear the boot, to provide additional support.  Elevate the foot to help with swelling.  Okay to bear weight through her heel.  No weightbearing through her forefoot.  Okay to remove the boot for hygiene.  She will follow-up in 2 weeks.  Provided with prescription for hydrocodone to help with pain.  Follow-up: Return in about 2 weeks (around 09/19/2021).  Subjective:  Chief Complaint  Patient presents with   Foot Injury    Bilateral feet injury 08/29/21 left fracture and right toenail avulsion     History of Present Illness: Terri Casey is a 61 y.o. female who presents for evaluation of left foot pain.  Approximate 1 week ago, she states she got up to walk outside to smoke a cigarette, when she fell.  Apparently her oxygen levels decreased rapidly.  She injured her foot.  Radiographs demonstrated fractures of the fourth and fifth metatarsal.  She has been wearing a boot.  Limited weightbearing since it happened.  She also sustained injuries to her right foot, but these were limited to her nails.  No prior injury to her left foot.  She is on oxygen at baseline.   Review of Systems: No fevers or chills No numbness or tingling No chest pain + shortness of breath No bowel or bladder dysfunction No GI distress No headaches   Medical History:  Past Medical History:  Diagnosis Date   Anxiety    Asthma    CHF (congestive heart failure) (HCC)    COPD (chronic obstructive pulmonary disease) ([place])    Diabetes  mellitus without complication ([place])    Hypertension    Thyroid disease     Past Surgical History:  Procedure Laterality Date   ABDOMINAL HYSTERECTOMY      History reviewed. No pertinent family history. Social History   Tobacco Use   Smoking status: Every Day    Packs/day: 0.50    Years: 20.00    Total pack years: 10.00    Types: Cigarettes   Smokeless tobacco: Never   Tobacco comments:    Patient reports 10-15 cigs at most.   Substance Use Topics   Alcohol use: No   Drug use: No    Allergies  Allergen Reactions   Estrogens Hives   Mustard Seed Hives   Strawberry Extract Hives    Current Meds  Medication Sig   HYDROcodone-acetaminophen (NORCO/VICODIN) 5-325 MG tablet Take 1 tablet by mouth every 6 (six) hours as needed for moderate pain.    Objective: BP (!) 118/54   Pulse 78   Ht '5\' 9"'$  (1.753 m)   Wt 225 lb (102.1 kg)   BMI 33.23 kg/m   Physical Exam:  General: Elderly female., Alert and oriented., No acute distress., and Seated in a wheelchair. Gait: Unable to ambulate.  Left foot with diffuse swelling and ecchymosis.  Tenderness to palpation over the lateral aspect of the forefoot.  Tenderness to palpation of the anterior and lateral aspect of the ankle.  Sensation intact over the dorsum of her foot.  Toes warm and well-perfused.  Onychomycosis of the great toe.  IMAGING: I personally reviewed images previously obtained in clinic   X-ray of the left foot demonstrates minimally displaced fracture of the distal aspect of the fourth metatarsal, as well as the fifth metatarsal.  X-rays of the right foot were negative for any injury.   New Medications:  Meds ordered this encounter  Medications   HYDROcodone-acetaminophen (NORCO/VICODIN) 5-325 MG tablet    Sig: Take 1 tablet by mouth every 6 (six) hours as needed for moderate pain.    Dispense:  20 tablet    Refill:  0      Mordecai Rasmussen, MD  09/05/2021 2:34 PM

## 2021-09-11 ENCOUNTER — Emergency Department (HOSPITAL_COMMUNITY): Payer: Medicaid Other

## 2021-09-11 ENCOUNTER — Inpatient Hospital Stay (HOSPITAL_COMMUNITY)
Admission: EM | Admit: 2021-09-11 | Discharge: 2021-09-25 | DRG: 871 | Disposition: A | Discharge status: Home Health | Payer: Medicaid Other | Source: Skilled Nursing Facility | Attending: Internal Medicine | Admitting: Internal Medicine

## 2021-09-11 ENCOUNTER — Inpatient Hospital Stay (HOSPITAL_COMMUNITY): Payer: Medicaid Other

## 2021-09-11 DIAGNOSIS — J432 Centrilobular emphysema: Secondary | ICD-10-CM | POA: Diagnosis present

## 2021-09-11 DIAGNOSIS — D539 Nutritional anemia, unspecified: Secondary | ICD-10-CM | POA: Diagnosis present

## 2021-09-11 DIAGNOSIS — Z91018 Allergy to other foods: Secondary | ICD-10-CM

## 2021-09-11 DIAGNOSIS — E669 Obesity, unspecified: Secondary | ICD-10-CM | POA: Diagnosis present

## 2021-09-11 DIAGNOSIS — R6521 Severe sepsis with septic shock: Secondary | ICD-10-CM | POA: Diagnosis present

## 2021-09-11 DIAGNOSIS — F419 Anxiety disorder, unspecified: Secondary | ICD-10-CM | POA: Diagnosis present

## 2021-09-11 DIAGNOSIS — I480 Paroxysmal atrial fibrillation: Secondary | ICD-10-CM | POA: Diagnosis present

## 2021-09-11 DIAGNOSIS — F32A Depression, unspecified: Secondary | ICD-10-CM | POA: Diagnosis present

## 2021-09-11 DIAGNOSIS — J9601 Acute respiratory failure with hypoxia: Secondary | ICD-10-CM | POA: Diagnosis not present

## 2021-09-11 DIAGNOSIS — Z888 Allergy status to other drugs, medicaments and biological substances status: Secondary | ICD-10-CM

## 2021-09-11 DIAGNOSIS — J90 Pleural effusion, not elsewhere classified: Secondary | ICD-10-CM | POA: Diagnosis not present

## 2021-09-11 DIAGNOSIS — J9602 Acute respiratory failure with hypercapnia: Secondary | ICD-10-CM | POA: Diagnosis not present

## 2021-09-11 DIAGNOSIS — E785 Hyperlipidemia, unspecified: Secondary | ICD-10-CM | POA: Diagnosis present

## 2021-09-11 DIAGNOSIS — J9690 Respiratory failure, unspecified, unspecified whether with hypoxia or hypercapnia: Secondary | ICD-10-CM | POA: Diagnosis present

## 2021-09-11 DIAGNOSIS — L409 Psoriasis, unspecified: Secondary | ICD-10-CM | POA: Diagnosis present

## 2021-09-11 DIAGNOSIS — J690 Pneumonitis due to inhalation of food and vomit: Secondary | ICD-10-CM | POA: Diagnosis present

## 2021-09-11 DIAGNOSIS — E039 Hypothyroidism, unspecified: Secondary | ICD-10-CM | POA: Diagnosis present

## 2021-09-11 DIAGNOSIS — N170 Acute kidney failure with tubular necrosis: Secondary | ICD-10-CM | POA: Diagnosis present

## 2021-09-11 DIAGNOSIS — F17210 Nicotine dependence, cigarettes, uncomplicated: Secondary | ICD-10-CM | POA: Diagnosis present

## 2021-09-11 DIAGNOSIS — J9621 Acute and chronic respiratory failure with hypoxia: Secondary | ICD-10-CM | POA: Diagnosis present

## 2021-09-11 DIAGNOSIS — E782 Mixed hyperlipidemia: Secondary | ICD-10-CM | POA: Diagnosis not present

## 2021-09-11 DIAGNOSIS — J189 Pneumonia, unspecified organism: Secondary | ICD-10-CM | POA: Diagnosis present

## 2021-09-11 DIAGNOSIS — G43909 Migraine, unspecified, not intractable, without status migrainosus: Secondary | ICD-10-CM | POA: Diagnosis present

## 2021-09-11 DIAGNOSIS — I10 Essential (primary) hypertension: Secondary | ICD-10-CM | POA: Diagnosis not present

## 2021-09-11 DIAGNOSIS — Y92122 Bedroom in nursing home as the place of occurrence of the external cause: Secondary | ICD-10-CM

## 2021-09-11 DIAGNOSIS — W19XXXD Unspecified fall, subsequent encounter: Secondary | ICD-10-CM | POA: Diagnosis present

## 2021-09-11 DIAGNOSIS — G8929 Other chronic pain: Secondary | ICD-10-CM | POA: Diagnosis present

## 2021-09-11 DIAGNOSIS — Z01818 Encounter for other preprocedural examination: Secondary | ICD-10-CM | POA: Diagnosis not present

## 2021-09-11 DIAGNOSIS — E872 Acidosis, unspecified: Secondary | ICD-10-CM | POA: Diagnosis present

## 2021-09-11 DIAGNOSIS — J449 Chronic obstructive pulmonary disease, unspecified: Secondary | ICD-10-CM | POA: Diagnosis not present

## 2021-09-11 DIAGNOSIS — A419 Sepsis, unspecified organism: Secondary | ICD-10-CM

## 2021-09-11 DIAGNOSIS — Z79890 Hormone replacement therapy (postmenopausal): Secondary | ICD-10-CM

## 2021-09-11 DIAGNOSIS — R0602 Shortness of breath: Secondary | ICD-10-CM | POA: Diagnosis present

## 2021-09-11 DIAGNOSIS — A411 Sepsis due to other specified staphylococcus: Principal | ICD-10-CM | POA: Diagnosis present

## 2021-09-11 DIAGNOSIS — G9341 Metabolic encephalopathy: Secondary | ICD-10-CM | POA: Diagnosis present

## 2021-09-11 DIAGNOSIS — I5032 Chronic diastolic (congestive) heart failure: Secondary | ICD-10-CM | POA: Diagnosis present

## 2021-09-11 DIAGNOSIS — S92351D Displaced fracture of fifth metatarsal bone, right foot, subsequent encounter for fracture with routine healing: Secondary | ICD-10-CM

## 2021-09-11 DIAGNOSIS — R0609 Other forms of dyspnea: Secondary | ICD-10-CM | POA: Diagnosis not present

## 2021-09-11 DIAGNOSIS — Z7901 Long term (current) use of anticoagulants: Secondary | ICD-10-CM

## 2021-09-11 DIAGNOSIS — W050XXA Fall from non-moving wheelchair, initial encounter: Secondary | ICD-10-CM | POA: Diagnosis present

## 2021-09-11 DIAGNOSIS — Z79891 Long term (current) use of opiate analgesic: Secondary | ICD-10-CM

## 2021-09-11 DIAGNOSIS — E876 Hypokalemia: Secondary | ICD-10-CM | POA: Diagnosis not present

## 2021-09-11 DIAGNOSIS — S92342D Displaced fracture of fourth metatarsal bone, left foot, subsequent encounter for fracture with routine healing: Secondary | ICD-10-CM

## 2021-09-11 DIAGNOSIS — J9622 Acute and chronic respiratory failure with hypercapnia: Secondary | ICD-10-CM | POA: Diagnosis not present

## 2021-09-11 DIAGNOSIS — Z79899 Other long term (current) drug therapy: Secondary | ICD-10-CM

## 2021-09-11 DIAGNOSIS — I110 Hypertensive heart disease with heart failure: Secondary | ICD-10-CM | POA: Diagnosis present

## 2021-09-11 DIAGNOSIS — E1165 Type 2 diabetes mellitus with hyperglycemia: Secondary | ICD-10-CM | POA: Diagnosis present

## 2021-09-11 DIAGNOSIS — G4733 Obstructive sleep apnea (adult) (pediatric): Secondary | ICD-10-CM | POA: Diagnosis present

## 2021-09-11 LAB — POCT I-STAT 7, (LYTES, BLD GAS, ICA,H+H)
Acid-Base Excess: 14 mmol/L — ABNORMAL HIGH (ref 0.0–2.0)
Bicarbonate: 37.8 mmol/L — ABNORMAL HIGH (ref 20.0–28.0)
Calcium, Ion: 1.11 mmol/L — ABNORMAL LOW (ref 1.15–1.40)
HCT: 36 % (ref 36.0–46.0)
Hemoglobin: 12.2 g/dL (ref 12.0–15.0)
O2 Saturation: 95 %
Patient temperature: 38.3
Potassium: 3.9 mmol/L (ref 3.5–5.1)
Sodium: 135 mmol/L (ref 135–145)
TCO2: 39 mmol/L — ABNORMAL HIGH (ref 22–32)
pCO2 arterial: 42.6 mmHg (ref 32–48)
pH, Arterial: 7.559 — ABNORMAL HIGH (ref 7.35–7.45)
pO2, Arterial: 72 mmHg — ABNORMAL LOW (ref 83–108)

## 2021-09-11 LAB — I-STAT ARTERIAL BLOOD GAS, ED
Acid-Base Excess: 14 mmol/L — ABNORMAL HIGH (ref 0.0–2.0)
Bicarbonate: 36.7 mmol/L — ABNORMAL HIGH (ref 20.0–28.0)
Calcium, Ion: 1.12 mmol/L — ABNORMAL LOW (ref 1.15–1.40)
HCT: 38 % (ref 36.0–46.0)
Hemoglobin: 12.9 g/dL (ref 12.0–15.0)
O2 Saturation: 84 %
Patient temperature: 101
Potassium: 3.8 mmol/L (ref 3.5–5.1)
Sodium: 136 mmol/L (ref 135–145)
TCO2: 38 mmol/L — ABNORMAL HIGH (ref 22–32)
pCO2 arterial: 40.7 mmHg (ref 32–48)
pH, Arterial: 7.568 — ABNORMAL HIGH (ref 7.35–7.45)
pO2, Arterial: 45 mmHg — ABNORMAL LOW (ref 83–108)

## 2021-09-11 LAB — BLOOD GAS, ARTERIAL
Acid-Base Excess: 7.3 mmol/L — ABNORMAL HIGH (ref 0.0–2.0)
Acid-Base Excess: 11.3 mmol/L — ABNORMAL HIGH (ref 0.0–2.0)
Bicarbonate: 38.1 mmol/L — ABNORMAL HIGH (ref 20.0–28.0)
Bicarbonate: 41.8 mmol/L — ABNORMAL HIGH (ref 20.0–28.0)
Drawn by: 10555
Drawn by: 23430
FIO2: 100 %
FIO2: 60 %
O2 Saturation: 92.2 %
O2 Saturation: 92.1 %
Patient temperature: 36.5
Patient temperature: 36.3
pCO2 arterial: 87 mmHg (ref 32–48)
pCO2 arterial: 84 mmHg (ref 32–48)
pH, Arterial: 7.25 — ABNORMAL LOW (ref 7.35–7.45)
pH, Arterial: 7.3 — ABNORMAL LOW (ref 7.35–7.45)
pO2, Arterial: 57 mmHg — ABNORMAL LOW (ref 83–108)
pO2, Arterial: 54 mmHg — ABNORMAL LOW (ref 83–108)

## 2021-09-11 LAB — URINALYSIS, ROUTINE W REFLEX MICROSCOPIC
Bilirubin Urine: NEGATIVE
Glucose, UA: 50 mg/dL — AB
Hgb urine dipstick: NEGATIVE
Ketones, ur: NEGATIVE mg/dL
Leukocytes,Ua: NEGATIVE
Nitrite: NEGATIVE
Protein, ur: 100 mg/dL — AB
Specific Gravity, Urine: 1.016 (ref 1.005–1.030)
pH: 5 (ref 5.0–8.0)

## 2021-09-11 LAB — LACTIC ACID, PLASMA
Lactic Acid, Venous: 6 mmol/L (ref 0.5–1.9)
Lactic Acid, Venous: 4.8 mmol/L (ref 0.5–1.9)
Lactic Acid, Venous: 1.8 mmol/L (ref 0.5–1.9)

## 2021-09-11 LAB — T4, FREE: Free T4: <0.25 ng/dL — ABNORMAL LOW (ref 0.61–1.12)

## 2021-09-11 LAB — COMPREHENSIVE METABOLIC PANEL
ALT: 11 U/L (ref 0–44)
AST: 22 U/L (ref 15–41)
Albumin: 3.2 g/dL — ABNORMAL LOW (ref 3.5–5.0)
Alkaline Phosphatase: 131 U/L — ABNORMAL HIGH (ref 38–126)
Anion gap: 13 (ref 5–15)
BUN: 15 mg/dL (ref 8–23)
CO2: 35 mmol/L — ABNORMAL HIGH (ref 22–32)
Calcium: 8.8 mg/dL — ABNORMAL LOW (ref 8.9–10.3)
Chloride: 94 mmol/L — ABNORMAL LOW (ref 98–111)
Creatinine, Ser: 1.28 mg/dL — ABNORMAL HIGH (ref 0.44–1.00)
GFR, Estimated: 47 mL/min — ABNORMAL LOW (ref >60)
Glucose, Bld: 254 mg/dL — ABNORMAL HIGH (ref 70–99)
Potassium: 4.5 mmol/L (ref 3.5–5.1)
Sodium: 142 mmol/L (ref 135–145)
Total Bilirubin: 0.9 mg/dL (ref 0.3–1.2)
Total Protein: 6.8 g/dL (ref 6.5–8.1)

## 2021-09-11 LAB — CBC
HCT: 47.6 % — ABNORMAL HIGH (ref 36.0–46.0)
Hemoglobin: 13 g/dL (ref 12.0–15.0)
MCH: 31.6 pg (ref 26.0–34.0)
MCHC: 27.3 g/dL — ABNORMAL LOW (ref 30.0–36.0)
MCV: 115.8 fL — ABNORMAL HIGH (ref 80.0–100.0)
Platelets: 174 10*3/uL (ref 150–400)
RBC: 4.11 MIL/uL (ref 3.87–5.11)
RDW: 14.6 % (ref 11.5–15.5)
WBC: 14.8 10*3/uL — ABNORMAL HIGH (ref 4.0–10.5)
nRBC: 0 % (ref 0.0–0.2)

## 2021-09-11 LAB — CBG MONITORING, ED
Glucose-Capillary: 233 mg/dL — ABNORMAL HIGH (ref 70–99)
Glucose-Capillary: 85 mg/dL (ref 70–99)
Glucose-Capillary: 92 mg/dL (ref 70–99)
Glucose-Capillary: 86 mg/dL (ref 70–99)

## 2021-09-11 LAB — GLUCOSE, CAPILLARY
Glucose-Capillary: 86 mg/dL (ref 70–99)
Glucose-Capillary: 93 mg/dL (ref 70–99)

## 2021-09-11 LAB — PROTIME-INR
INR: 1.7 — ABNORMAL HIGH (ref 0.8–1.2)
Prothrombin Time: 19.4 seconds — ABNORMAL HIGH (ref 11.4–15.2)

## 2021-09-11 LAB — TROPONIN I (HIGH SENSITIVITY)
Troponin I (High Sensitivity): 15 ng/L (ref <18)
Troponin I (High Sensitivity): 15 ng/L (ref <18)

## 2021-09-11 LAB — BRAIN NATRIURETIC PEPTIDE: B Natriuretic Peptide: 126 pg/mL — ABNORMAL HIGH (ref 0.0–100.0)

## 2021-09-11 LAB — PROCALCITONIN: Procalcitonin: 2.37 ng/mL

## 2021-09-11 LAB — CORTISOL: Cortisol, Plasma: 21.3 ug/dL

## 2021-09-11 LAB — STREP PNEUMONIAE URINARY ANTIGEN
Strep Pneumo Urinary Antigen: NEGATIVE
Strep Pneumo Urinary Antigen: NEGATIVE

## 2021-09-11 LAB — TSH: TSH: 68.265 u[IU]/mL — ABNORMAL HIGH (ref 0.350–4.500)

## 2021-09-11 LAB — MRSA NEXT GEN BY PCR, NASAL: MRSA by PCR Next Gen: NOT DETECTED

## 2021-09-11 LAB — HEMOGLOBIN A1C
Hgb A1c MFr Bld: 4.9 % (ref 4.8–5.6)
Hgb A1c MFr Bld: 4.9 % (ref 4.8–5.6)
Mean Plasma Glucose: 93.93 mg/dL
Mean Plasma Glucose: 93.93 mg/dL

## 2021-09-11 MED ORDER — PIPERACILLIN-TAZOBACTAM 3.375 G IVPB
3.3750 g | Freq: Three times a day (TID) | INTRAVENOUS | Status: DC
  Administered 2021-09-11 – 2021-09-13 (×5): 3.375 g via INTRAVENOUS
  Filled 2021-09-11 (×5): qty 50

## 2021-09-11 MED ORDER — PANTOPRAZOLE SODIUM 40 MG IV SOLR
40.0000 mg | INTRAVENOUS | Status: DC
  Administered 2021-09-11: 40 mg via INTRAVENOUS
  Filled 2021-09-11: qty 10

## 2021-09-11 MED ORDER — ETOMIDATE 2 MG/ML IV SOLN
20.0000 mg | Freq: Once | INTRAVENOUS | Status: AC
  Administered 2021-09-11: 20 mg via INTRAVENOUS
  Filled 2021-09-11: qty 10

## 2021-09-11 MED ORDER — ORAL CARE MOUTH RINSE: 15.0000 mL | OROMUCOSAL | Status: DC | PRN

## 2021-09-11 MED ORDER — ATORVASTATIN CALCIUM 10 MG PO TABS
20.0000 mg | ORAL_TABLET | Freq: Every day | ORAL | Status: DC
  Administered 2021-09-11 – 2021-09-13 (×3): 20 mg
  Filled 2021-09-11 (×3): qty 2

## 2021-09-11 MED ORDER — SODIUM CHLORIDE 0.9 % IV SOLN
500.0000 mg | INTRAVENOUS | Status: DC
  Administered 2021-09-11: 500 mg via INTRAVENOUS
  Filled 2021-09-11 (×2): qty 5

## 2021-09-11 MED ORDER — LACTATED RINGERS IV SOLN
INTRAVENOUS | Status: DC
  Administered 2021-09-11: 100 mL/h via INTRAVENOUS

## 2021-09-11 MED ORDER — CHLORHEXIDINE GLUCONATE CLOTH 2 % EX PADS: 6.0000 | MEDICATED_PAD | Freq: Every day | CUTANEOUS | Status: DC

## 2021-09-11 MED ORDER — IPRATROPIUM-ALBUTEROL 0.5-2.5 (3) MG/3ML IN SOLN
3.0000 mL | Freq: Once | RESPIRATORY_TRACT | Status: AC
  Administered 2021-09-11: 3 mL via RESPIRATORY_TRACT
  Filled 2021-09-11: qty 3

## 2021-09-11 MED ORDER — SODIUM CHLORIDE 0.9 % IV SOLN: INTRAVENOUS | Status: DC | PRN

## 2021-09-11 MED ORDER — FENTANYL CITRATE PF 50 MCG/ML IJ SOSY
50.0000 ug | PREFILLED_SYRINGE | INTRAMUSCULAR | Status: DC | PRN
  Administered 2021-09-12: 50 ug via INTRAVENOUS
  Filled 2021-09-11: qty 1

## 2021-09-11 MED ORDER — ACETAMINOPHEN 325 MG PO TABS: 650.0000 mg | ORAL_TABLET | Freq: Four times a day (QID) | ORAL | Status: DC | PRN

## 2021-09-11 MED ORDER — FENTANYL CITRATE PF 50 MCG/ML IJ SOSY
50.0000 ug | PREFILLED_SYRINGE | INTRAMUSCULAR | Status: DC | PRN
  Administered 2021-09-11: 50 ug via INTRAVENOUS
  Filled 2021-09-11: qty 1

## 2021-09-11 MED ORDER — SODIUM CHLORIDE 0.9 % IV SOLN
2.0000 g | Freq: Once | INTRAVENOUS | Status: AC
  Administered 2021-09-11: 2 g via INTRAVENOUS
  Filled 2021-09-11: qty 20

## 2021-09-11 MED ORDER — ACETAMINOPHEN 650 MG RE SUPP
650.0000 mg | Freq: Four times a day (QID) | RECTAL | Status: DC | PRN
  Administered 2021-09-11: 650 mg via RECTAL
  Filled 2021-09-11: qty 1

## 2021-09-11 MED ORDER — LACTATED RINGERS IV BOLUS
1000.0000 mL | Freq: Once | INTRAVENOUS | Status: AC
  Administered 2021-09-11: 1000 mL via INTRAVENOUS

## 2021-09-11 MED ORDER — PROPOFOL 1000 MG/100ML IV EMUL
5.0000 ug/kg/min | INTRAVENOUS | Status: DC
  Administered 2021-09-11: 15 ug/kg/min via INTRAVENOUS
  Administered 2021-09-11: 45 ug/kg/min via INTRAVENOUS
  Administered 2021-09-11: 60 ug/kg/min via INTRAVENOUS
  Administered 2021-09-12: 30 ug/kg/min via INTRAVENOUS
  Administered 2021-09-12: 50 ug/kg/min via INTRAVENOUS
  Administered 2021-09-12: 60 ug/kg/min via INTRAVENOUS
  Administered 2021-09-12 (×2): 25 ug/kg/min via INTRAVENOUS
  Administered 2021-09-13 (×2): 50 ug/kg/min via INTRAVENOUS
  Filled 2021-09-11 (×7): qty 100
  Filled 2021-09-11: qty 200
  Filled 2021-09-11: qty 100

## 2021-09-11 MED ORDER — FENTANYL CITRATE PF 50 MCG/ML IJ SOSY
50.0000 ug | PREFILLED_SYRINGE | INTRAMUSCULAR | Status: DC | PRN
  Administered 2021-09-11 (×2): 100 ug via INTRAVENOUS
  Administered 2021-09-12 (×2): 50 ug via INTRAVENOUS
  Filled 2021-09-11: qty 2
  Filled 2021-09-11: qty 1
  Filled 2021-09-11 (×2): qty 2

## 2021-09-11 MED ORDER — ONDANSETRON HCL 4 MG PO TABS: 4.0000 mg | ORAL_TABLET | Freq: Four times a day (QID) | ORAL | Status: DC | PRN

## 2021-09-11 MED ORDER — NOREPINEPHRINE 4 MG/250ML-% IV SOLN
0.0000 ug/min | INTRAVENOUS | Status: DC
  Administered 2021-09-11: 6 ug/min via INTRAVENOUS
  Administered 2021-09-11: 2 ug/min via INTRAVENOUS
  Administered 2021-09-12: 8 ug/min via INTRAVENOUS
  Filled 2021-09-11 (×3): qty 250

## 2021-09-11 MED ORDER — ONDANSETRON HCL 4 MG/2ML IJ SOLN: 4.0000 mg | Freq: Four times a day (QID) | INTRAMUSCULAR | Status: DC | PRN

## 2021-09-11 MED ORDER — PANTOPRAZOLE 2 MG/ML SUSPENSION
40.0000 mg | Freq: Every day | ORAL | Status: DC
  Administered 2021-09-12 – 2021-09-13 (×2): 40 mg
  Filled 2021-09-11 (×2): qty 20

## 2021-09-11 MED ORDER — LEVOTHYROXINE SODIUM 100 MCG PO TABS
200.0000 ug | ORAL_TABLET | Freq: Every day | ORAL | Status: DC
  Administered 2021-09-12 – 2021-09-13 (×2): 200 ug
  Filled 2021-09-11 (×2): qty 2

## 2021-09-11 MED ORDER — ORAL CARE MOUTH RINSE
15.0000 mL | OROMUCOSAL | Status: DC
  Administered 2021-09-11 – 2021-09-13 (×20): 15 mL via OROMUCOSAL

## 2021-09-11 MED ORDER — INSULIN ASPART 100 UNIT/ML IJ SOLN
0.0000 [IU] | INTRAMUSCULAR | Status: DC
  Administered 2021-09-18 – 2021-09-23 (×8): 2 [IU] via SUBCUTANEOUS

## 2021-09-11 MED ORDER — IPRATROPIUM BROMIDE 0.02 % IN SOLN
RESPIRATORY_TRACT | Status: AC
  Administered 2021-09-11: 0.5 mg
  Filled 2021-09-11: qty 2.5

## 2021-09-11 MED ORDER — FOLIC ACID 1 MG PO TABS
1.0000 mg | ORAL_TABLET | Freq: Every day | ORAL | Status: DC
  Administered 2021-09-12 – 2021-09-13 (×2): 1 mg
  Filled 2021-09-11 (×2): qty 1

## 2021-09-11 MED ORDER — SODIUM CHLORIDE 0.9% FLUSH: 10.0000 mL | INTRAVENOUS | Status: DC | PRN

## 2021-09-11 MED ORDER — CHLORHEXIDINE GLUCONATE CLOTH 2 % EX PADS
6.0000 | MEDICATED_PAD | Freq: Every day | CUTANEOUS | Status: DC
  Administered 2021-09-11 – 2021-09-24 (×14): 6 via TOPICAL

## 2021-09-11 MED ORDER — DOCUSATE SODIUM 100 MG PO CAPS: 100.0000 mg | ORAL_CAPSULE | Freq: Two times a day (BID) | ORAL | Status: DC | PRN

## 2021-09-11 MED ORDER — ALBUTEROL SULFATE (2.5 MG/3ML) 0.083% IN NEBU
INHALATION_SOLUTION | RESPIRATORY_TRACT | Status: AC
  Administered 2021-09-11: 5 mg
  Filled 2021-09-11: qty 6

## 2021-09-11 MED ORDER — LORAZEPAM 2 MG/ML IJ SOLN
2.0000 mg | Freq: Once | INTRAMUSCULAR | Status: AC
  Administered 2021-09-11: 2 mg via INTRAVENOUS

## 2021-09-11 MED ORDER — PIPERACILLIN-TAZOBACTAM 3.375 G IVPB 30 MIN
3.3750 g | Freq: Once | INTRAVENOUS | Status: AC
  Administered 2021-09-11: 3.375 g via INTRAVENOUS
  Filled 2021-09-11: qty 50

## 2021-09-11 MED ORDER — ORAL CARE MOUTH RINSE: 15.0000 mL | OROMUCOSAL | Status: DC

## 2021-09-11 MED ORDER — VASOPRESSIN 20 UNITS/100 ML INFUSION FOR SHOCK: 0.0000 [IU]/min | INTRAVENOUS | Status: DC

## 2021-09-11 MED ORDER — HEPARIN SODIUM (PORCINE) 5000 UNIT/ML IJ SOLN
5000.0000 [IU] | Freq: Three times a day (TID) | INTRAMUSCULAR | Status: DC
  Administered 2021-09-11: 5000 [IU] via SUBCUTANEOUS
  Filled 2021-09-11: qty 1

## 2021-09-11 MED ORDER — IPRATROPIUM-ALBUTEROL 0.5-2.5 (3) MG/3ML IN SOLN
3.0000 mL | Freq: Once | RESPIRATORY_TRACT | Status: AC
Start: 2021-09-11 — End: 2021-09-11
  Administered 2021-09-11: 3 mL via RESPIRATORY_TRACT
  Filled 2021-09-11: qty 3

## 2021-09-11 MED ORDER — IOHEXOL 350 MG/ML SOLN
100.0000 mL | Freq: Once | INTRAVENOUS | Status: AC | PRN
  Administered 2021-09-11: 100 mL via INTRAVENOUS

## 2021-09-11 MED ORDER — LORAZEPAM 2 MG/ML IJ SOLN
1.0000 mg | Freq: Once | INTRAMUSCULAR | Status: DC
  Filled 2021-09-11: qty 1

## 2021-09-11 MED ORDER — LORAZEPAM 2 MG/ML IJ SOLN
INTRAMUSCULAR | Status: AC
  Filled 2021-09-11: qty 2

## 2021-09-11 MED ORDER — SODIUM CHLORIDE 0.9% FLUSH: 10.0000 mL | Freq: Two times a day (BID) | INTRAVENOUS | Status: DC

## 2021-09-11 MED ORDER — SUCCINYLCHOLINE CHLORIDE 200 MG/10ML IV SOSY
100.0000 mg | PREFILLED_SYRINGE | Freq: Once | INTRAVENOUS | Status: AC
  Administered 2021-09-11: 100 mg via INTRAVENOUS
  Filled 2021-09-11: qty 10

## 2021-09-11 MED ORDER — SODIUM CHLORIDE 0.9% FLUSH
10.0000 mL | Freq: Two times a day (BID) | INTRAVENOUS | Status: DC
  Administered 2021-09-11 – 2021-09-12 (×2): 10 mL
  Administered 2021-09-12 – 2021-09-13 (×2): 20 mL
  Administered 2021-09-13 – 2021-09-20 (×14): 10 mL
  Administered 2021-09-20: 30 mL
  Administered 2021-09-21 – 2021-09-24 (×7): 10 mL
  Administered 2021-09-24: 30 mL

## 2021-09-11 MED ORDER — LEVALBUTEROL HCL 0.63 MG/3ML IN NEBU
0.6300 mg | INHALATION_SOLUTION | Freq: Four times a day (QID) | RESPIRATORY_TRACT | Status: DC
  Administered 2021-09-11 – 2021-09-12 (×4): 0.63 mg via RESPIRATORY_TRACT
  Filled 2021-09-11 (×4): qty 3

## 2021-09-11 MED ORDER — HEPARIN SODIUM (PORCINE) 5000 UNIT/ML IJ SOLN
5000.0000 [IU] | Freq: Three times a day (TID) | INTRAMUSCULAR | Status: DC
  Administered 2021-09-11 – 2021-09-12 (×2): 5000 [IU] via SUBCUTANEOUS
  Filled 2021-09-11: qty 1

## 2021-09-11 MED ORDER — POLYETHYLENE GLYCOL 3350 17 G PO PACK: 17.0000 g | PACK | Freq: Every day | ORAL | Status: DC | PRN

## 2021-09-11 NOTE — Consult Note (Signed)
NAME:  Terri Casey, MRN:  [id number], DOB:  1960-05-24, LOS: 0 ADMISSION DATE:  09/11/2021, CONSULTATION DATE:  09/11/21 REFERRING MD:  [facility], CHIEF COMPLAINT:  acute on chronic hypoxemic/hypercarbic Resp Failure / ? Asp    History of Present Illness:   49 yowf MO / cpap dep in past/  smoker with medical history significant for COPD, tobacco abuse, chronic diastolic CHF, hypothyroidism, hypertension, paroxysmal atrial fibrillation, type 2 diabetes,  brought to the ED from her facility due to unresponsiveness with agonal breathing.  She was found on the floor tipped over her wheelchair and she was in respiratory distress.  EMS was called and bag-valve-mask was started in route and patient was brought to the ED.  No other history is available.  Initial eval in ER suggested possible sepsis with dense L lung infiltrates c/w mostly atx on CTa with fluid / debris LMSB c/w aspiration /obst and likely asp pna evolving and PCCM service consulted pm 9/11     Pertinent  Medical History  Anxiety Asthma CHF COPD - no pfts on file  Diabetes mellitus without complication Hypertension not on acei Thyroid disease.   Significant Hospital Events: Including procedures, antibiotic start and stop dates in addition to other pertinent events   CTa chest extensive L sided airway obstruction with fluid/debris with min L effusion R IJ CVL    Scheduled Meds:  heparin  5,000 Units Subcutaneous Q8H   insulin aspart  0-15 Units Subcutaneous Q4H   levalbuterol  0.63 mg Nebulization Q6H   LORazepam  1 mg Intravenous Once   pantoprazole (PROTONIX) IV  40 mg Intravenous Q24H   Continuous Infusions:  lactated ringers 100 mL/hr at 09/11/21 0809   norepinephrine (LEVOPHED) Adult infusion 2.5 mcg/min (09/11/21 1248)   piperacillin-tazobactam     piperacillin-tazobactam (ZOSYN)  IV     PRN Meds:.acetaminophen **OR** acetaminophen, ondansetron **OR** ondansetron (ZOFRAN) IV  Interim History / Subjective:   Denies cp/doing better on bipap   lung volumes now around 500 cc  and lower RR  Objective   Blood pressure 113/77, pulse 69, temperature 98.4 F (36.9 C), resp. rate 20, height '5\' 9"'$  (1.753 m), SpO2 92 %.    FiO2 (%):  [60 %-100 %] 60 %   Intake/Output Summary (Last 24 hours) at 09/11/2021 1257 Last data filed at 09/11/2021 0810 Gross per 24 hour  Intake 1350 ml  Output --  Net 1350 ml   There were no vitals filed for this visit.  Examination: Tmax:  98.4 General appearance:    obese wf  supine in ER on Bipap  At Rest 02 sats  90% on bipap 0.45 FIO2   No jvd Oropharynx bipap mask in place Neck supple Lungs with a few scattered exp > insp rhonchi bilaterally/ very distant BS esp L base RRR no s3 or or sign murmur Abd obese with poor  excursion  Extr warm with no edema or clubbing noted/ L boot  Neuro  Sensorium responds to verbal but very difficult to understand,  no apparent motor deficits     I personally reviewed images and agree with radiology impression as follows:  CXR:   portable  9/11 Increased density in left lower lung field suggests pleural effusion and atelectasis/pneumonia. There is decreased volume in the left lung. Tip of right IJ central venous catheter is seen in superior vena cava. There is no pneumothorax. My review:  def vol loss on L      Assessment & Plan:  1)  Acute on chronic resp failure hypoxemic and hypercarbic components / bipap dep on arrival but high risk needing ET based on CT findings with extensive aspiration into LMSB/ best option is ET and transfer to [facility] as she will likely need prolonged intubation and repeated bedside FOB.    >>>   discussed with EDP/ Dr Manuella Ghazi and Eric Form NP at [facility]    2) ? Septic shock likely source Asp/ HCAP  - neo dep in ER/ Abx per [facility]  >>> check cvp when available / continue fluids as needed to maintain cvp > 10 or wean off the pressors, whichever comes first  >>> check PCT/ cortisol level as has been on  prednisone in past    3)  AKI  like secondary to #2  Lab Results  Component Value Date   CREATININE 1.28 (H) 09/11/2021   CREATININE 0.76 08/29/2021   CREATININE 0.79 03/20/2021    4)  MO / CPAP dep at baseline, high risk dvt/ pe >>> Rx Hep sq per [facility]   Best Practice (right click and "Reselect all SmartList Selections" daily)    Per [facility]ad   Labs   CBC: Recent Labs  Lab 09/11/21 0446  WBC 14.8*  HGB 13.0  HCT 47.6*  MCV 115.8*  PLT 786    Basic Metabolic Panel: Recent Labs  Lab 09/11/21 0446  NA 142  K 4.5  CL 94*  CO2 35*  GLUCOSE 254*  BUN 15  CREATININE 1.28*  CALCIUM 8.8*   GFR: Estimated Creatinine Clearance: 58 mL/min (A) (by C-G formula based on SCr of 1.28 mg/dL (H)). Recent Labs  Lab 09/11/21 0446 09/11/21 0645  WBC 14.8*  --   LATICACIDVEN 6.0* 4.8*    Liver Function Tests: Recent Labs  Lab 09/11/21 0446  AST 22  ALT 11  ALKPHOS 131*  BILITOT 0.9  PROT 6.8  ALBUMIN 3.2*   No results for input(s): "LIPASE", "AMYLASE" in the last 168 hours. No results for input(s): "AMMONIA" in the last 168 hours.  ABG    Component Value Date/Time   PHART 7.3 (L) 09/11/2021 0844   PCO2ART 84 (HH) 09/11/2021 0844   PO2ART 54 (L) 09/11/2021 0844   HCO3 41.8 (H) 09/11/2021 0844   TCO2 40 (H) 06/13/2019 1026   ACIDBASEDEF 7.7 (H) 08/09/2019 0748   O2SAT 92.1 09/11/2021 0844     Coagulation Profile: Recent Labs  Lab 09/11/21 0446  INR 1.7*    Cardiac Enzymes: No results for input(s): "CKTOTAL", "CKMB", "CKMBINDEX", "TROPONINI" in the last 168 hours.  HbA1C: Hgb A1c MFr Bld  Date/Time Value Ref Range Status  10/07/2019 01:27 PM 5.0 4.8 - 5.6 % Final    Comment:    (NOTE) Pre diabetes:          5.7%-6.4%  Diabetes:              >6.4%  Glycemic control for   <7.0% adults with diabetes   06/09/2019 01:55 PM 5.1 4.8 - 5.6 % Final    Comment:    (NOTE) Pre diabetes:          5.7%-6.4% Diabetes:              >6.4% Glycemic control  for   <7.0% adults with diabetes     CBG: Recent Labs  Lab 09/11/21 0517  GLUCAP 233*       Past Medical History:  She,  has a past medical history of Anxiety, Asthma, CHF (congestive heart failure) ([place]), COPD (  chronic obstructive pulmonary disease) ([place]), Diabetes mellitus without complication ([place]), Hypertension, and Thyroid disease.   Surgical History:   Past Surgical History:  Procedure Laterality Date   ABDOMINAL HYSTERECTOMY       Social History:   reports that she has been smoking cigarettes. She has a 10.00 pack-year smoking history. She has never used smokeless tobacco. She reports that she does not drink alcohol and does not use drugs.   Family History:  Her family history is not on file.   Allergies Allergies  Allergen Reactions   Estrogens Hives   Mustard Seed Hives   Strawberry Extract Hives     Home Medications  Prior to Admission medications   Medication Sig Start Date End Date Taking? Authorizing Provider  albuterol (PROAIR HFA) 108 (90 Base) MCG/ACT inhaler 2 puffs every 4 hours as needed only  if your can't catch your breath 03/21/21  Yes Johnson, Clanford L, MD  albuterol (PROVENTIL) (2.5 MG/3ML) 0.083% nebulizer solution Take 3 mLs (2.5 mg total) by nebulization every 6 (six) hours as needed for wheezing or shortness of breath. 03/21/21  Yes Johnson, Clanford L, MD  amLODipine (NORVASC) 10 MG tablet Take 10 mg by mouth daily. 02/28/21  Yes [provider]  atorvastatin (LIPITOR) 20 MG tablet Take 20 mg by mouth at bedtime.    Yes [provider]  bethanechol (URECHOLINE) 5 MG tablet Take 5 mg by mouth 2 (two) times daily. 08/06/19  Yes [provider]  cyclobenzaprine (FLEXERIL) 5 MG tablet Take 5 mg by mouth 3 (three) times daily. 02/23/19  Yes [provider]  escitalopram (LEXAPRO) 10 MG tablet Take 1 tablet by mouth daily. 05/26/20  Yes [provider]  folic acid (FOLVITE) 1 MG tablet Take 1 tablet (1 mg  total) by mouth daily. 10/11/19  Yes Tat, Shanon Brow, MD  furosemide (LASIX) 40 MG tablet Take 1 tablet (40 mg total) by mouth daily. 10/11/19  Yes Tat, Shanon Brow, MD  levothyroxine (SYNTHROID) 200 MCG tablet Take 1 tablet (200 mcg total) by mouth daily at 6 (six) AM. 06/20/19  Yes Swayze, Ava, DO  liothyronine (CYTOMEL) 25 MCG tablet Take 25 mcg by mouth daily. 07/31/21  Yes [provider]  loratadine (CLARITIN) 10 MG tablet Take 10 mg by mouth daily.   Yes [provider]  metoprolol tartrate (LOPRESSOR) 25 MG tablet Take 0.5 tablets (12.5 mg total) by mouth 2 (two) times daily. 10/10/19  Yes Tat, Shanon Brow, MD  oxyCODONE-acetaminophen (PERCOCET) 10-325 MG tablet Take 1 tablet by mouth 4 (four) times daily. 09/06/21  Yes [provider]  pantoprazole (PROTONIX) 40 MG tablet Take 1 tablet (40 mg total) by mouth daily. 06/20/19  Yes Swayze, Ava, DO  SUMAtriptan (IMITREX) 50 MG tablet Take 50 mg by mouth every 2 (two) hours as needed for migraine. 07/31/21  Yes [provider]  topiramate (TOPAMAX) 100 MG tablet Take 100 mg by mouth 2 (two) times daily. 06/01/20  Yes [provider]  XARELTO 20 MG TABS tablet Take 20 mg by mouth daily. 05/08/19  Yes [provider]  clobetasol cream (TEMOVATE) 0.05 % Apply topically. Apply as directed. Patient not taking: Reported on 09/11/2021 03/15/20   [provider]  HYDROcodone-acetaminophen (NORCO/VICODIN) 5-325 MG tablet Take 1 tablet by mouth every 6 (six) hours as needed for moderate pain. Patient not taking: Reported on 09/11/2021 09/05/21   Mordecai Rasmussen, MD  predniSONE (DELTASONE) 20 MG tablet Take 3 PO QAM x5days, 2 PO QAM x5days,  1 PO QAM x5days Patient not taking: Reported on 09/11/2021 03/22/21   Murlean Iba, MD      The patient is critically ill with multiple organ systems failure and requires high complexity decision making for assessment and support, frequent evaluation and titration of therapies,  application of advanced monitoring technologies and extensive interpretation of multiple databases. Critical Care Time devoted to patient care services described in this note is 45  minutes.   Christinia Gully, MD Pulmonary and [place] [phone]   After 7:00 pm call Elink  [phone]

## 2021-09-11 NOTE — ED Notes (Signed)
Pt intubated/ 7.5 ET tube, 23 @ the lip. Positive color change noted.

## 2021-09-11 NOTE — ED Notes (Signed)
Bair hugger applied.

## 2021-09-11 NOTE — ED Triage Notes (Signed)
Pt in from home via [facility] after being found tilted downwards in her wheelchair against a door, agonal respirations on EMS arrival, ETCO2 of 88. On scene, EMS provided BVM rescue breaths and gave multiple duonebs and '125mg'$  Solumedrol en route. Arrives GCS of 11

## 2021-09-11 NOTE — ED Notes (Signed)
Patient trying to pull of leads and pull at bipap. Patient verbalized understanding when nurse told patient the importance of everything monitoring.

## 2021-09-11 NOTE — Progress Notes (Addendum)
[facility] Progress Note Patient Name: Terri Casey DOB: 07-18-1960 MRN: [id number]   Date of Service  09/11/2021  HPI/Events of Note  Notified that [facility] called regarding positive blood cultures in aerobic and anaerobic bottles.  Pt received Zosyn.   eICU Interventions  Add vancomycin pending final cultures.      Intervention Category Intermediate Interventions: Infection - evaluation and management  [name] 09/11/2021, 11:55 PM  3:27 AM Notified by pharmacy that blood culture grew staph epidermidis but also documented Methicillin resistance.   Plan> Pt on Vancomycin and Zosyn at this time.

## 2021-09-11 NOTE — Progress Notes (Signed)
[facility] Physician-Brief Progress Note Patient Name: Terri Casey DOB: 05-Apr-1960 MRN: [id number]   Date of Service  09/11/2021  HPI/Events of Note  60/F with COPD, tobacco abuse, chronic diastolic CHF, paroxysmal afib, DM, brought in due to unresponsiveness and agonal breathing. CT chest showing fluid and debris obstructing the left main bronchus, consolidation at the lingula and LLL as well as RML and RLL.  CT head with no acute CT findings.  Pt was intubated.  Pt transferred to [facility] from [facility] for possible repeated bronchoscopy.   BP 120/70, HR 85, RR 27, O2 sats 93%.     eICU Interventions  Admit to ICU.  Repeat ABG now. May need to decrease minute ventilation.  Latest vent setting at 530, 16, PEEP 8, 60% FiO2.  Arterial line is being placed now.  Continue empiric antibiotics. Follow up cultures. Pulmonary toilet.  Levophed to maintain MAP >65. Heparin for DVT prophylaxis.  Protonix for GI prophylaxis.      Intervention Category Evaluation Type: New Patient Evaluation  [name] 09/11/2021, 9:48 PM

## 2021-09-11 NOTE — H&P (Signed)
History and Physical    Terri Casey BJS:[id number] DOB: March 17, 1960 DOA: 09/11/2021  PCP: [name], NP   Patient coming from: SNF  Chief Complaint: Dyspnea/unresponsiveness  HPI: Terri Casey is a 61 y.o. female with medical history significant for COPD, tobacco abuse, chronic diastolic CHF, hypothyroidism, hypertension, paroxysmal atrial fibrillation, type 2 diabetes, and obesity who was brought to the ED from her facility due to unresponsiveness with agonal breathing.  She was found on the floor tipped over her wheelchair and she was in respiratory distress.  EMS was called and bag-valve-mask was started in route and patient was brought to the ED.  No other history is provided otherwise.   ED Course: Vital signs with some initial hypothermia as well as bradycardia and tachypnea.  ABG demonstrating hypoxemic and hypercarbic respiratory failure and she was started on BiPAP.  Blood pressures were noted to be low and patient has received IJ central venous line and has been started on fluids and norepinephrine.  Lactic acid initially 6 and has down trended to 4.8.  She has been started on Rocephin and azithromycin with findings of left lower lobe pneumonia with effusion.  Review of Systems: Reviewed as noted above, otherwise negative.  Past Medical History:  Diagnosis Date   Anxiety    Asthma    CHF (congestive heart failure) (HCC)    COPD (chronic obstructive pulmonary disease) ([place])    Diabetes mellitus without complication ([place])    Hypertension    Thyroid disease     Past Surgical History:  Procedure Laterality Date   ABDOMINAL HYSTERECTOMY       reports that she has been smoking cigarettes. She has a 10.00 pack-year smoking history. She has never used smokeless tobacco. She reports that she does not drink alcohol and does not use drugs.  Allergies  Allergen Reactions   Estrogens Hives   Mustard Seed Hives   Strawberry Extract Hives    No family history on  file.  Prior to Admission medications   Medication Sig Start Date End Date Taking? Authorizing Provider  albuterol (PROAIR HFA) 108 (90 Base) MCG/ACT inhaler 2 puffs every 4 hours as needed only  if your can't catch your breath 03/21/21  Yes Johnson, Clanford L, MD  albuterol (PROVENTIL) (2.5 MG/3ML) 0.083% nebulizer solution Take 3 mLs (2.5 mg total) by nebulization every 6 (six) hours as needed for wheezing or shortness of breath. 03/21/21  Yes Johnson, Clanford L, MD  amLODipine (NORVASC) 10 MG tablet Take 10 mg by mouth daily. 02/28/21  Yes [provider]  atorvastatin (LIPITOR) 20 MG tablet Take 20 mg by mouth at bedtime.    Yes [provider]  bethanechol (URECHOLINE) 5 MG tablet Take 5 mg by mouth 2 (two) times daily. 08/06/19  Yes [provider]  cyclobenzaprine (FLEXERIL) 5 MG tablet Take 5 mg by mouth 3 (three) times daily. 02/23/19  Yes [provider]  escitalopram (LEXAPRO) 10 MG tablet Take 1 tablet by mouth daily. 05/26/20  Yes [provider]  folic acid (FOLVITE) 1 MG tablet Take 1 tablet (1 mg total) by mouth daily. 10/11/19  Yes Tat, Shanon Brow, MD  furosemide (LASIX) 40 MG tablet Take 1 tablet (40 mg total) by mouth daily. 10/11/19  Yes Tat, Shanon Brow, MD  levothyroxine (SYNTHROID) 200 MCG tablet Take 1 tablet (200 mcg total) by mouth daily at 6 (six) AM. 06/20/19  Yes Swayze, Ava, DO  liothyronine (CYTOMEL) 25 MCG tablet Take 25 mcg by mouth daily.  07/31/21  Yes [provider]  loratadine (CLARITIN) 10 MG tablet Take 10 mg by mouth daily.   Yes [provider]  metoprolol tartrate (LOPRESSOR) 25 MG tablet Take 0.5 tablets (12.5 mg total) by mouth 2 (two) times daily. 10/10/19  Yes Tat, Shanon Brow, MD  oxyCODONE-acetaminophen (PERCOCET) 10-325 MG tablet Take 1 tablet by mouth 4 (four) times daily. 09/06/21  Yes [provider]  pantoprazole (PROTONIX) 40 MG tablet Take 1 tablet (40 mg total) by mouth daily. 06/20/19  Yes Swayze,  Ava, DO  SUMAtriptan (IMITREX) 50 MG tablet Take 50 mg by mouth every 2 (two) hours as needed for migraine. 07/31/21  Yes [provider]  topiramate (TOPAMAX) 100 MG tablet Take 100 mg by mouth 2 (two) times daily. 06/01/20  Yes [provider]  XARELTO 20 MG TABS tablet Take 20 mg by mouth daily. 05/08/19  Yes [provider]  clobetasol cream (TEMOVATE) 0.05 % Apply topically. Apply as directed. Patient not taking: Reported on 09/11/2021 03/15/20   [provider]  HYDROcodone-acetaminophen (NORCO/VICODIN) 5-325 MG tablet Take 1 tablet by mouth every 6 (six) hours as needed for moderate pain. Patient not taking: Reported on 09/11/2021 09/05/21   Mordecai Rasmussen, MD  predniSONE (DELTASONE) 20 MG tablet Take 3 PO QAM x5days, 2 PO QAM x5days, 1 PO QAM x5days Patient not taking: Reported on 09/11/2021 03/22/21   Murlean Iba, MD    Physical Exam: Vitals:   09/11/21 1145 09/11/21 1200 09/11/21 1215 09/11/21 1220  BP: (!) 154/125 (!) 172/150 (!) 180/157   Pulse: 71 72 72 70  Resp: (!) 22 (!) 21 17 (!) 24  Temp: 98.1 F (36.7 C) 98.2 F (36.8 C) 97.9 F (36.6 C)   TempSrc:      SpO2: 94% 93% 91% 93%  Height:        Constitutional: Lethargic and unresponsive, obese Vitals:   09/11/21 1145 09/11/21 1200 09/11/21 1215 09/11/21 1220  BP: (!) 154/125 (!) 172/150 (!) 180/157   Pulse: 71 72 72 70  Resp: (!) 22 (!) 21 17 (!) 24  Temp: 98.1 F (36.7 C) 98.2 F (36.8 C) 97.9 F (36.6 C)   TempSrc:      SpO2: 94% 93% 91% 93%  Height:       Eyes: lids and conjunctivae normal Neck: normal, supple Respiratory: Diminished to auscultation bilaterally currently on BiPAP Cardiovascular: Regular rate and rhythm, no murmurs. Abdomen: no tenderness, no distention. Bowel sounds positive.  Musculoskeletal:  No edema. Skin: no rashes, lesions, ulcers.  Psychiatric: Flat affect  Labs on Admission: I have personally reviewed following labs and imaging  studies  CBC: Recent Labs  Lab 09/11/21 0446  WBC 14.8*  HGB 13.0  HCT 47.6*  MCV 115.8*  PLT 629   Basic Metabolic Panel: Recent Labs  Lab 09/11/21 0446  NA 142  K 4.5  CL 94*  CO2 35*  GLUCOSE 254*  BUN 15  CREATININE 1.28*  CALCIUM 8.8*   GFR: Estimated Creatinine Clearance: 58 mL/min (A) (by C-G formula based on SCr of 1.28 mg/dL (H)). Liver Function Tests: Recent Labs  Lab 09/11/21 0446  AST 22  ALT 11  ALKPHOS 131*  BILITOT 0.9  PROT 6.8  ALBUMIN 3.2*   No results for input(s): "LIPASE", "AMYLASE" in the last 168 hours. No results for input(s): "AMMONIA" in the last 168 hours. Coagulation Profile: Recent Labs  Lab 09/11/21 0446  INR 1.7*   Cardiac Enzymes: No results for input(s): "  CKTOTAL", "CKMB", "CKMBINDEX", "TROPONINI" in the last 168 hours. BNP (last 3 results) No results for input(s): "PROBNP" in the last 8760 hours. HbA1C: No results for input(s): "HGBA1C" in the last 72 hours. CBG: Recent Labs  Lab 09/11/21 0517  GLUCAP 233*   Lipid Profile: No results for input(s): "CHOL", "HDL", "LDLCALC", "TRIG", "CHOLHDL", "LDLDIRECT" in the last 72 hours. Thyroid Function Tests: No results for input(s): "TSH", "T4TOTAL", "FREET4", "T3FREE", "THYROIDAB" in the last 72 hours. Anemia Panel: No results for input(s): "VITAMINB12", "FOLATE", "FERRITIN", "TIBC", "IRON", "RETICCTPCT" in the last 72 hours. Urine analysis:    Component Value Date/Time   COLORURINE AMBER (A) 09/11/2021 0526   APPEARANCEUR CLOUDY (A) 09/11/2021 0526   LABSPEC 1.016 09/11/2021 0526   PHURINE 5.0 09/11/2021 0526   GLUCOSEU 50 (A) 09/11/2021 0526   HGBUR NEGATIVE 09/11/2021 0526   BILIRUBINUR NEGATIVE 09/11/2021 0526   KETONESUR NEGATIVE 09/11/2021 0526   PROTEINUR 100 (A) 09/11/2021 0526   NITRITE NEGATIVE 09/11/2021 0526   LEUKOCYTESUR NEGATIVE 09/11/2021 0526    Radiological Exams on Admission: DG Chest Port 1 View  Result Date: 09/11/2021 CLINICAL DATA:   Placement of central venous catheter EXAM: PORTABLE CHEST 1 VIEW COMPARISON:  Previous studies including the examination done earlier today FINDINGS: There is decreased volume in the left lung. There is shift of mediastinum to the left due to decreased volume in the left lung. There is large infiltrate in left lower lung field obscuring the left hemidiaphragm. There are no signs of pulmonary edema or focal infiltrates in right lung. There is interval placement of right IJ central venous catheter with its tip in superior vena cava. There is no pneumothorax. IMPRESSION: Increased density in left lower lung field suggests pleural effusion and atelectasis/pneumonia. There is decreased volume in the left lung. Tip of right IJ central venous catheter is seen in superior vena cava. There is no pneumothorax. Electronically Signed   By: Elmer Picker M.D.   On: 09/11/2021 08:10   CT HEAD WO CONTRAST (5MM)  Result Date: 09/11/2021 CLINICAL DATA:  Neck trauma, dangerous injury mechanism with head trauma. Pulmonary embolism suspected. High probability. EXAM: CT HEAD WITHOUT CONTRAST CT CERVICAL SPINE WITHOUT CONTRAST CT ANGIOGRAPHY CHEST WITH CONTRAST TECHNIQUE: Multidetector CT imaging of the head was performed using the standard protocol without the use of intravenous contrast. Multiplanar CT image reconstructions were created from the axial data set. Multidetector CT imaging of the cervical spine was performed using the standard protocol without the use of intravenous contrast. Multiplanar CT image reconstructions were created from the axial data set. Multidetector CT imaging of the chest was performed using the standard protocol during bolus administration of intravenous contrast. Multiplanar CT image reconstructions and MIPs were obtained to evaluate the vascular anatomy. RADIATION DOSE REDUCTION: This exam was performed according to the departmental dose-optimization program which includes automated exposure  control, adjustment of the mA and/or kV according to patient size and/or use of iterative reconstruction technique. CONTRAST:  75 mL Isovue 370 IV. COMPARISON:  No prior head CT or cervical spine CT for comparison. Comparison is made with portable chest today, portable chest 08/29/2021, CTA chest 03/26/2015. FINDINGS: CT HEAD WITHOUT CONTRAST FINDINGS Brain: There is mild cerebral atrophy. Cerebellum and brainstem are unremarkable. Gray-white matter differentiation and attenuation appear normal. No infarct, hemorrhage or mass are seen. The ventricles are normal in size and position. Vascular: No hyperdense vessels are unexpected calcifications. Skull: No visible scalp hematoma. Negative for fractures or focal lesions. Sinuses/orbits: Unremarkable orbital contents.  There are scattered opacities in the ethmoid air cells. Other sinuses, bilateral mastoid air cells are clear. Other: None. CT CERVICAL SPINE FINDINGS Alignment: Normal. Skull base and vertebrae: Intact, with osteopenia. No focal or significant bone lesion. Discogenic Schmorl's nodes C6-7 incidentally noted. Soft tissues and spinal canal: No spinal canal hematoma is seen. There is no precervical soft tissue swelling. There are mild calcifications in the proximal cervical ICAs. Disc levels: Mild disc space loss C3-4 and C5-6, mild to moderate disc narrowing C6-7 with small bidirectional osteophytes at C5-6 and C6-7. No herniated discs or cord compromise. No significant facet hypertrophy or foraminal narrowing. Other: None. CTA CHEST FINDINGS Cardiovascular: Mild cardiomegaly is increased from 2017. There previously was a pericardial effusion which is no longer seen. There are trace calcifications in the right coronary artery. There are no other visible coronary calcifications. There is aortic atherosclerosis without stenosis, aneurysm or dissection. The great vessels are clear. Ectatic pulmonary trunk 3.6 cm indicating arterial hypertension, previously 2.9  cm. No arterial embolism is seen. The pulmonary veins are normal caliber. Mediastinum/Nodes: Left chest volume loss with left mediastinal shift. There are mildly enlarged lymph nodes along the left hilum, largest 1.4 cm in short axis. No bulky or encasing adenopathy. There are left-sided prevascular lymph nodes up to 9 mm, subcarinal nodes up to 1.1 cm. Thyroid gland markedly atrophic without mass. There is no axillary mass. Thoracic esophagus is unremarkable. The tracheal air column is clear. Lungs/Pleura: Small left pleural effusion extends subpulmonic, some layering posteriorly. No right pleural fluid or pneumothorax. The lungs are moderately emphysematous with centrilobular changes predominating. There is fluid and debris obstructing the mid to distal left main bronchus and extending into the lower lobe first order segmental bronchi. The upper lobe main bronchi are not well seen and probably also contain fluid and debris. There is consolidation in the inferior/lateral lingula, dense consolidation with air bronchograms in left lower lobe basal segments, some degree of atelectasis also evidenced by leftward mediastinal shift. Right lung central bronchi are patent. There is bronchial thickening in the right middle lobe with consolidation in the medial segment medially. There is additional small paraspinal consolidation in the right lower lobe medial basal segment. There is an irregular 7 mm partially pleural-based nodule in the lateral aspect of the right upper lobe apex on 6:36. Remaining lungs are clear. Upper Abdomen: There is mild hepatic steatosis. No acute abnormality. Musculoskeletal: Mild thoracic kyphodextroscoliosis. The ribcage is intact. Osteopenia and thoracic spondylosis. Review of the MIP images confirms the above findings. IMPRESSION: 1. No acute intracranial CT findings or depressed skull fractures. 2. Scattered ethmoid sinus opacification. 3. Osteopenia and degenerative change of the cervical  spine without evidence of fractures or malalignment. 4. Cardiomegaly and prominent pulmonary trunk. No arterial embolism is seen. Trace CAD. 5. Aortic atherosclerosis. 6. Fluid and debris obstructing the left main bronchus and extending into the left upper and lower lobes with left lung volume loss as well as lingular and left lower lobe basal consolidation, with underlying pleural effusion. Findings compatible with a postobstructive pneumonia possibly related to aspiration, with likely reactive adenopathy. A follow-up study is recommended to ensure clearing after treatment. 7. Additional areas of consolidation in the right middle and lower lobes. COPD. 8. 7 mm right upper lobe irregular nodule not seen in 2017. Per Fleischner guidelines, follow-up chest CT is recommended 6-12 months, then another follow-up CT at 18-24 months. These guidelines do not apply to cancer patients and immunocompromised patients. For lung cancer screening,  adhere to Lung-RADS guidelines. Electronically Signed   By: Telford Nab M.D.   On: 09/11/2021 07:46   CT CERVICAL SPINE WO CONTRAST  Result Date: 09/11/2021 CLINICAL DATA:  Neck trauma, dangerous injury mechanism with head trauma. Pulmonary embolism suspected. High probability. EXAM: CT HEAD WITHOUT CONTRAST CT CERVICAL SPINE WITHOUT CONTRAST CT ANGIOGRAPHY CHEST WITH CONTRAST TECHNIQUE: Multidetector CT imaging of the head was performed using the standard protocol without the use of intravenous contrast. Multiplanar CT image reconstructions were created from the axial data set. Multidetector CT imaging of the cervical spine was performed using the standard protocol without the use of intravenous contrast. Multiplanar CT image reconstructions were created from the axial data set. Multidetector CT imaging of the chest was performed using the standard protocol during bolus administration of intravenous contrast. Multiplanar CT image reconstructions and MIPs were obtained to evaluate  the vascular anatomy. RADIATION DOSE REDUCTION: This exam was performed according to the departmental dose-optimization program which includes automated exposure control, adjustment of the mA and/or kV according to patient size and/or use of iterative reconstruction technique. CONTRAST:  75 mL Isovue 370 IV. COMPARISON:  No prior head CT or cervical spine CT for comparison. Comparison is made with portable chest today, portable chest 08/29/2021, CTA chest 03/26/2015. FINDINGS: CT HEAD WITHOUT CONTRAST FINDINGS Brain: There is mild cerebral atrophy. Cerebellum and brainstem are unremarkable. Gray-white matter differentiation and attenuation appear normal. No infarct, hemorrhage or mass are seen. The ventricles are normal in size and position. Vascular: No hyperdense vessels are unexpected calcifications. Skull: No visible scalp hematoma. Negative for fractures or focal lesions. Sinuses/orbits: Unremarkable orbital contents. There are scattered opacities in the ethmoid air cells. Other sinuses, bilateral mastoid air cells are clear. Other: None. CT CERVICAL SPINE FINDINGS Alignment: Normal. Skull base and vertebrae: Intact, with osteopenia. No focal or significant bone lesion. Discogenic Schmorl's nodes C6-7 incidentally noted. Soft tissues and spinal canal: No spinal canal hematoma is seen. There is no precervical soft tissue swelling. There are mild calcifications in the proximal cervical ICAs. Disc levels: Mild disc space loss C3-4 and C5-6, mild to moderate disc narrowing C6-7 with small bidirectional osteophytes at C5-6 and C6-7. No herniated discs or cord compromise. No significant facet hypertrophy or foraminal narrowing. Other: None. CTA CHEST FINDINGS Cardiovascular: Mild cardiomegaly is increased from 2017. There previously was a pericardial effusion which is no longer seen. There are trace calcifications in the right coronary artery. There are no other visible coronary calcifications. There is aortic  atherosclerosis without stenosis, aneurysm or dissection. The great vessels are clear. Ectatic pulmonary trunk 3.6 cm indicating arterial hypertension, previously 2.9 cm. No arterial embolism is seen. The pulmonary veins are normal caliber. Mediastinum/Nodes: Left chest volume loss with left mediastinal shift. There are mildly enlarged lymph nodes along the left hilum, largest 1.4 cm in short axis. No bulky or encasing adenopathy. There are left-sided prevascular lymph nodes up to 9 mm, subcarinal nodes up to 1.1 cm. Thyroid gland markedly atrophic without mass. There is no axillary mass. Thoracic esophagus is unremarkable. The tracheal air column is clear. Lungs/Pleura: Small left pleural effusion extends subpulmonic, some layering posteriorly. No right pleural fluid or pneumothorax. The lungs are moderately emphysematous with centrilobular changes predominating. There is fluid and debris obstructing the mid to distal left main bronchus and extending into the lower lobe first order segmental bronchi. The upper lobe main bronchi are not well seen and probably also contain fluid and debris. There is consolidation in the inferior/lateral lingula,  dense consolidation with air bronchograms in left lower lobe basal segments, some degree of atelectasis also evidenced by leftward mediastinal shift. Right lung central bronchi are patent. There is bronchial thickening in the right middle lobe with consolidation in the medial segment medially. There is additional small paraspinal consolidation in the right lower lobe medial basal segment. There is an irregular 7 mm partially pleural-based nodule in the lateral aspect of the right upper lobe apex on 6:36. Remaining lungs are clear. Upper Abdomen: There is mild hepatic steatosis. No acute abnormality. Musculoskeletal: Mild thoracic kyphodextroscoliosis. The ribcage is intact. Osteopenia and thoracic spondylosis. Review of the MIP images confirms the above findings. IMPRESSION:  1. No acute intracranial CT findings or depressed skull fractures. 2. Scattered ethmoid sinus opacification. 3. Osteopenia and degenerative change of the cervical spine without evidence of fractures or malalignment. 4. Cardiomegaly and prominent pulmonary trunk. No arterial embolism is seen. Trace CAD. 5. Aortic atherosclerosis. 6. Fluid and debris obstructing the left main bronchus and extending into the left upper and lower lobes with left lung volume loss as well as lingular and left lower lobe basal consolidation, with underlying pleural effusion. Findings compatible with a postobstructive pneumonia possibly related to aspiration, with likely reactive adenopathy. A follow-up study is recommended to ensure clearing after treatment. 7. Additional areas of consolidation in the right middle and lower lobes. COPD. 8. 7 mm right upper lobe irregular nodule not seen in 2017. Per Fleischner guidelines, follow-up chest CT is recommended 6-12 months, then another follow-up CT at 18-24 months. These guidelines do not apply to cancer patients and immunocompromised patients. For lung cancer screening, adhere to Lung-RADS guidelines. Electronically Signed   By: Telford Nab M.D.   On: 09/11/2021 07:46   CT Angio Chest Pulmonary Embolism (PE) W or WO Contrast  Result Date: 09/11/2021 CLINICAL DATA:  Neck trauma, dangerous injury mechanism with head trauma. Pulmonary embolism suspected. High probability. EXAM: CT HEAD WITHOUT CONTRAST CT CERVICAL SPINE WITHOUT CONTRAST CT ANGIOGRAPHY CHEST WITH CONTRAST TECHNIQUE: Multidetector CT imaging of the head was performed using the standard protocol without the use of intravenous contrast. Multiplanar CT image reconstructions were created from the axial data set. Multidetector CT imaging of the cervical spine was performed using the standard protocol without the use of intravenous contrast. Multiplanar CT image reconstructions were created from the axial data set. Multidetector  CT imaging of the chest was performed using the standard protocol during bolus administration of intravenous contrast. Multiplanar CT image reconstructions and MIPs were obtained to evaluate the vascular anatomy. RADIATION DOSE REDUCTION: This exam was performed according to the departmental dose-optimization program which includes automated exposure control, adjustment of the mA and/or kV according to patient size and/or use of iterative reconstruction technique. CONTRAST:  75 mL Isovue 370 IV. COMPARISON:  No prior head CT or cervical spine CT for comparison. Comparison is made with portable chest today, portable chest 08/29/2021, CTA chest 03/26/2015. FINDINGS: CT HEAD WITHOUT CONTRAST FINDINGS Brain: There is mild cerebral atrophy. Cerebellum and brainstem are unremarkable. Gray-white matter differentiation and attenuation appear normal. No infarct, hemorrhage or mass are seen. The ventricles are normal in size and position. Vascular: No hyperdense vessels are unexpected calcifications. Skull: No visible scalp hematoma. Negative for fractures or focal lesions. Sinuses/orbits: Unremarkable orbital contents. There are scattered opacities in the ethmoid air cells. Other sinuses, bilateral mastoid air cells are clear. Other: None. CT CERVICAL SPINE FINDINGS Alignment: Normal. Skull base and vertebrae: Intact, with osteopenia. No focal or significant bone  lesion. Discogenic Schmorl's nodes C6-7 incidentally noted. Soft tissues and spinal canal: No spinal canal hematoma is seen. There is no precervical soft tissue swelling. There are mild calcifications in the proximal cervical ICAs. Disc levels: Mild disc space loss C3-4 and C5-6, mild to moderate disc narrowing C6-7 with small bidirectional osteophytes at C5-6 and C6-7. No herniated discs or cord compromise. No significant facet hypertrophy or foraminal narrowing. Other: None. CTA CHEST FINDINGS Cardiovascular: Mild cardiomegaly is increased from 2017. There  previously was a pericardial effusion which is no longer seen. There are trace calcifications in the right coronary artery. There are no other visible coronary calcifications. There is aortic atherosclerosis without stenosis, aneurysm or dissection. The great vessels are clear. Ectatic pulmonary trunk 3.6 cm indicating arterial hypertension, previously 2.9 cm. No arterial embolism is seen. The pulmonary veins are normal caliber. Mediastinum/Nodes: Left chest volume loss with left mediastinal shift. There are mildly enlarged lymph nodes along the left hilum, largest 1.4 cm in short axis. No bulky or encasing adenopathy. There are left-sided prevascular lymph nodes up to 9 mm, subcarinal nodes up to 1.1 cm. Thyroid gland markedly atrophic without mass. There is no axillary mass. Thoracic esophagus is unremarkable. The tracheal air column is clear. Lungs/Pleura: Small left pleural effusion extends subpulmonic, some layering posteriorly. No right pleural fluid or pneumothorax. The lungs are moderately emphysematous with centrilobular changes predominating. There is fluid and debris obstructing the mid to distal left main bronchus and extending into the lower lobe first order segmental bronchi. The upper lobe main bronchi are not well seen and probably also contain fluid and debris. There is consolidation in the inferior/lateral lingula, dense consolidation with air bronchograms in left lower lobe basal segments, some degree of atelectasis also evidenced by leftward mediastinal shift. Right lung central bronchi are patent. There is bronchial thickening in the right middle lobe with consolidation in the medial segment medially. There is additional small paraspinal consolidation in the right lower lobe medial basal segment. There is an irregular 7 mm partially pleural-based nodule in the lateral aspect of the right upper lobe apex on 6:36. Remaining lungs are clear. Upper Abdomen: There is mild hepatic steatosis. No acute  abnormality. Musculoskeletal: Mild thoracic kyphodextroscoliosis. The ribcage is intact. Osteopenia and thoracic spondylosis. Review of the MIP images confirms the above findings. IMPRESSION: 1. No acute intracranial CT findings or depressed skull fractures. 2. Scattered ethmoid sinus opacification. 3. Osteopenia and degenerative change of the cervical spine without evidence of fractures or malalignment. 4. Cardiomegaly and prominent pulmonary trunk. No arterial embolism is seen. Trace CAD. 5. Aortic atherosclerosis. 6. Fluid and debris obstructing the left main bronchus and extending into the left upper and lower lobes with left lung volume loss as well as lingular and left lower lobe basal consolidation, with underlying pleural effusion. Findings compatible with a postobstructive pneumonia possibly related to aspiration, with likely reactive adenopathy. A follow-up study is recommended to ensure clearing after treatment. 7. Additional areas of consolidation in the right middle and lower lobes. COPD. 8. 7 mm right upper lobe irregular nodule not seen in 2017. Per Fleischner guidelines, follow-up chest CT is recommended 6-12 months, then another follow-up CT at 18-24 months. These guidelines do not apply to cancer patients and immunocompromised patients. For lung cancer screening, adhere to Lung-RADS guidelines. Electronically Signed   By: Telford Nab M.D.   On: 09/11/2021 07:46   DG Chest Port 1 View  Result Date: 09/11/2021 CLINICAL DATA:  Shortness of breath and weakness.  EXAM: PORTABLE CHEST 1 VIEW COMPARISON:  Portable chest 08/29/2021. FINDINGS: There is increased patchy consolidation in the left lower lung field, small underlying left pleural effusion, findings consistent with left lower lobe pneumonia and parapneumonic effusion. The remaining lungs clear with COPD change. There is mild cardiomegaly without evidence of CHF. Tortuous aorta with stable mediastinum. Osteopenia. Patient is rotated to the  right. IMPRESSION: Left lower lobe patchy consolidation and small underlying pleural fluid consistent with left lower lobe pneumonia or aspiration and parapneumonic effusion. Clinical correlation and radiographic follow-up recommended.  COPD. Electronically Signed   By: Telford Nab M.D.   On: 09/11/2021 05:30    EKG: Independently reviewed.  SR 66 bpm  Assessment/Plan Principal Problem:   Septic shock (HCC) Active Problems:   Acute hypercapnic respiratory failure (HCC)   Chronic diastolic CHF (congestive heart failure) (HCC)   AF (paroxysmal atrial fibrillation) (HCC)   Essential hypertension, benign   Hyperlipidemia   Hypothyroidism   PNA (pneumonia)   Acute hypoxemic respiratory failure (HCC)    Septic shock, present on admission, secondary to left lower lobe pneumonia with effusion concerning for aspiration Given Rocephin and azithromycin in ED, started on Zosyn Check strep pneumonia and Legionella antigen May need to consider thoracentesis and/or bronchoscopy, appreciate pulmonology evaluation Monitor lactic acidosis and continue IV fluid judiciously Monitor repeat labs and blood cultures  Acute on chronic hypoxemic respiratory failure and hypercapnic failure secondary to above Wean BiPAP as tolerated No PE noted  Acute metabolic encephalopathy secondary to above Monitor as septic shock is being treated Avoid home Flexeril and topiramate for now CT head with no acute findings  COPD Appreciate pulmonology evaluation Does not appear to be in exacerbation, but will order scheduled breathing treatments for now Avoid steroids  Chronic diastolic CHF Continue IV fluid judiciously and hold Lasix while monitoring lactic acidosis Continue ins and outs and daily weights Echocardiogram 6/21 with EF 50-55%, indeterminate diastolic function  Paroxysmal atrial fibrillation Currently rate controlled, hold metoprolol Does not appear to be on  anticoagulation  Hypothyroidism Resume home levothyroxine when able  OSA Currently on BiPAP  Type 2 diabetes with hyperglycemia SSI every 4 hours  Hypertension Hold home BP medications given shock physiology  Obesity BMI 33.23  Tobacco abuse Noted in chart, unable to ascertain whether this is active   DVT prophylaxis: Heparin Code Status: Full Family Communication: None at bedside Disposition Plan:Admit for tx of septic shock Consults called:PCCM Admission status: Inpatient, ICU  Severity of Illness: The appropriate patient status for this patient is INPATIENT. Inpatient status is judged to be reasonable and necessary in order to provide the required intensity of service to ensure the patient's safety. The patient's presenting symptoms, physical exam findings, and initial radiographic and laboratory data in the context of their chronic comorbidities is felt to place them at high risk for further clinical deterioration. Furthermore, it is not anticipated that the patient will be medically stable for discharge from the hospital within 2 midnights of admission.   * I certify that at the point of admission it is my clinical judgment that the patient will require inpatient hospital care spanning beyond 2 midnights from the point of admission due to high intensity of service, high risk for further deterioration and high frequency of surveillance required.*  Critical care time: 72 minutes.   Thelmer Legler D Manuella Ghazi DO [facility]  If 7PM-7AM, please contact night-coverage [email or web address]  09/11/2021, 12:38 PM

## 2021-09-11 NOTE — ED Provider Notes (Addendum)
[facility] Emergency Department Provider Note MRN:  [id number]  Arrival date & time: 09/11/21     Chief Complaint   Shortness of Breath   History of Present Illness   Terri Casey is a 61 y.o. year-old female with a history of CHF, COPD, diabetes presenting to the ED with chief complaint of shortness of breath.  Reportedly patient was found on the floor of her room at the care facility.  She had tipped over her wheelchair and the wheelchair and the patient were upside down.  Per EMS she was agonal he breathing and minimally responsive.  Improved mental status with bag-valve-mask en route.  Review of Systems  I was unable to obtain a full/accurate HPI, PMH, or ROS due to the patient's altered mental status, respiratory failure.  Patient's Health History    Past Medical History:  Diagnosis Date   Anxiety    Asthma    CHF (congestive heart failure) (HCC)    COPD (chronic obstructive pulmonary disease) ([place])    Diabetes mellitus without complication ([place])    Hypertension    Thyroid disease     Past Surgical History:  Procedure Laterality Date   ABDOMINAL HYSTERECTOMY      No family history on file.  Social History   Socioeconomic History   Marital status: Single    Spouse name: Not on file   Number of children: Not on file   Years of education: Not on file   Highest education level: Not on file  Occupational History   Not on file  Tobacco Use   Smoking status: Every Day    Packs/day: 0.50    Years: 20.00    Total pack years: 10.00    Types: Cigarettes   Smokeless tobacco: Never   Tobacco comments:    Patient reports 10-15 cigs at most.   Substance and Sexual Activity   Alcohol use: No   Drug use: No   Sexual activity: Not on file  Other Topics Concern   Not on file  Social History Narrative   Not on file   Social Determinants of Health   Financial Resource Strain: Not on file  Food Insecurity: Not on file  Transportation  Needs: Not on file  Physical Activity: Not on file  Stress: Not on file  Social Connections: Not on file  Intimate Partner Violence: Not on file     Physical Exam   Vitals:   09/11/21 0649 09/11/21 0700  BP:  (!) 78/56  Pulse: 63 65  Resp:    Temp: (!) 97.2 F (36.2 C) (!) 97.2 F (36.2 C)  SpO2: 96% 97%    CONSTITUTIONAL: Ill-appearing NEURO/PSYCH: Somnolent, wakes with effort, oriented to name EYES:  eyes equal and reactive ENT/NECK:  no LAD, no JVD CARDIO: Regular rate, well-perfused, normal S1 and S2 PULM: Poor air movement GI/GU:  non-distended, non-tender MSK/SPINE:  No gross deformities, no edema SKIN:  no rash, atraumatic   *Additional and/or pertinent findings included in MDM below  Diagnostic and Interventional Summary    EKG Interpretation  Date/Time:  Monday September 11 2021 04:52:38 EDT Ventricular Rate:  66 PR Interval:  164 QRS Duration: 122 QT Interval:  421 QTC Calculation: 442 R Axis:   112 Text Interpretation: Sinus rhythm Nonspecific intraventricular conduction delay Nonspecific T abnormalities, lateral leads Confirmed by Gerlene Fee [phone]) on 09/11/2021 5:04:13 AM       Labs Reviewed  CBC - Abnormal; Notable for the following components:  Result Value   WBC 14.8 (*)    HCT 47.6 (*)    MCV 115.8 (*)    MCHC 27.3 (*)    All other components within normal limits  COMPREHENSIVE METABOLIC PANEL - Abnormal; Notable for the following components:   Chloride 94 (*)    CO2 35 (*)    Glucose, Bld 254 (*)    Creatinine, Ser 1.28 (*)    Calcium 8.8 (*)    Albumin 3.2 (*)    Alkaline Phosphatase 131 (*)    GFR, Estimated 47 (*)    All other components within normal limits  BRAIN NATRIURETIC PEPTIDE - Abnormal; Notable for the following components:   B Natriuretic Peptide 126.0 (*)    All other components within normal limits  LACTIC ACID, PLASMA - Abnormal; Notable for the following components:   Lactic Acid, Venous 6.0 (*)    All  other components within normal limits  PROTIME-INR - Abnormal; Notable for the following components:   Prothrombin Time 19.4 (*)    INR 1.7 (*)    All other components within normal limits  BLOOD GAS, ARTERIAL - Abnormal; Notable for the following components:   pH, Arterial 7.25 (*)    pCO2 arterial 87 (*)    pO2, Arterial 57 (*)    Bicarbonate 38.1 (*)    Acid-Base Excess 7.3 (*)    All other components within normal limits  URINALYSIS, ROUTINE W REFLEX MICROSCOPIC - Abnormal; Notable for the following components:   Color, Urine AMBER (*)    APPearance CLOUDY (*)    Glucose, UA 50 (*)    Protein, ur 100 (*)    Bacteria, UA RARE (*)    Non Squamous Epithelial 0-5 (*)    All other components within normal limits  LACTIC ACID, PLASMA - Abnormal; Notable for the following components:   Lactic Acid, Venous 4.8 (*)    All other components within normal limits  CBG MONITORING, ED - Abnormal; Notable for the following components:   Glucose-Capillary 233 (*)    All other components within normal limits  CULTURE, BLOOD (ROUTINE X 2)  CULTURE, BLOOD (ROUTINE X 2)  TROPONIN I (HIGH SENSITIVITY)  TROPONIN I (HIGH SENSITIVITY)    CT CERVICAL SPINE WO CONTRAST  Final Result    CT HEAD WO CONTRAST (5MM)  Final Result    CT Angio Chest Pulmonary Embolism (PE) W or WO Contrast  Final Result    DG Chest Port 1 View  Final Result    DG Chest Port 1 View    (Results Pending)    Medications  LORazepam (ATIVAN) injection 1 mg (has no administration in time range)  azithromycin (ZITHROMAX) 500 mg in sodium chloride 0.9 % 250 mL IVPB (500 mg Intravenous New Bag/Given 09/11/21 0557)  lactated ringers infusion (has no administration in time range)  norepinephrine (LEVOPHED) '4mg'$  in 245m (0.016 mg/mL) premix infusion (has no administration in time range)  lactated ringers bolus 1,000 mL (has no administration in time range)  ipratropium-albuterol (DUONEB) 0.5-2.5 (3) MG/3ML nebulizer solution  3 mL (3 mLs Nebulization Given 09/11/21 0519)  ipratropium-albuterol (DUONEB) 0.5-2.5 (3) MG/3ML nebulizer solution 3 mL (3 mLs Nebulization Given 09/11/21 0519)  iohexol (OMNIPAQUE) 350 MG/ML injection 100 mL (100 mLs Intravenous Contrast Given 09/11/21 0711)  cefTRIAXone (ROCEPHIN) 2 g in sodium chloride 0.9 % 100 mL IVPB (0 g Intravenous Stopped 09/11/21 0654)  lactated ringers bolus 1,000 mL (1,000 mLs Intravenous New Bag/Given 09/11/21 0549)  ipratropium (ATROVENT) 0.02 %  nebulizer solution (0.5 mg  Given 09/11/21 0543)  albuterol (PROVENTIL) (2.5 MG/3ML) 0.083% nebulizer solution (5 mg  Given 09/11/21 0542)     Procedures  /  Critical Care .Critical Care  Performed by: Maudie Flakes, MD Authorized by: Maudie Flakes, MD   Critical care provider statement:    Critical care time (minutes):  45   Critical care was necessary to treat or prevent imminent or life-threatening deterioration of the following conditions:  Respiratory failure   Critical care was time spent personally by me on the following activities:  Development of treatment plan with patient or surrogate, discussions with consultants, evaluation of patient's response to treatment, examination of patient, ordering and review of laboratory studies, ordering and review of radiographic studies, ordering and performing treatments and interventions, pulse oximetry, re-evaluation of patient's condition and review of old charts .Central Line  Date/Time: 09/11/2021 7:53 AM  Performed by: Maudie Flakes, MD Authorized by: Maudie Flakes, MD   Consent:    Consent obtained:  Emergent situation Universal protocol:    Patient identity confirmed:  Hospital-assigned identification number Pre-procedure details:    Indication(s): central venous access     Hand hygiene: Hand hygiene performed prior to insertion     Sterile barrier technique: All elements of maximal sterile technique followed     Skin preparation:  Chlorhexidine   Skin  preparation agent: Skin preparation agent completely dried prior to procedure   Sedation:    Sedation type:  Anxiolysis Anesthesia:    Anesthesia method:  Local infiltration   Local anesthetic:  Lidocaine 1% w/o epi Procedure details:    Location:  R internal jugular   Patient position:  Trendelenburg   Procedural supplies:  Triple lumen   Catheter size:  7 Fr   Landmarks identified: yes     Ultrasound guidance: yes     Ultrasound guidance timing: real time     Sterile ultrasound techniques: Sterile gel and sterile probe covers were used     Number of attempts:  2   Successful placement: yes   Post-procedure details:    Post-procedure:  Dressing applied and line sutured   Assessment:  Blood return through all ports, free fluid flow, no pneumothorax on x-ray and placement verified by x-ray   Procedure completion:  Tolerated well, no immediate complications   ED Course and Medical Decision Making  Initial Impression and Ddx Hypoxic and hypercarbic respiratory failure.  Patient has a long history of CHF and COPD.  Per chart review had a similar presentation earlier in the year.  She has responded well to EMS intervention, received Solu-Medrol, nebs, bag-valve-mask.  She is now breathing on her own, she is on BiPAP, she responds to questions.  She is still profoundly hypoxic, was in the 60s on room air currently requiring 100% FiO2.  Suspect hypercarbia as well given the reported PCO2 readings with EMS and the unresponsiveness that improved with bagging.  Overall suspect that she developed respiratory failure and then threw herself out of the wheelchair in a desperate effort for help.  Must consider trauma as well, unclear if she had any significant head trauma, chest trauma etc.  Patient also has a cam boot to the left leg and the left leg appears a bit swollen.  PE is considered as well.  Past medical/surgical history that increases complexity of ED encounter: COPD  Interpretation of  Diagnostics I personally reviewed the Chest Xray and my interpretation is as follows: Left pleural effusion  Labs, CT imaging pending  Patient Reassessment and Ultimate Disposition/Management Clinical Course as of 09/11/21 0756  Mon Sep 11, 2021  0609 Patient is hypothermic, has a leukocytosis, lactate of 6, having some soft blood pressures at this time.  Sepsis is also considered, code sepsis initiated, starting IV antibiotics.  Will start with judicious fluids, 1 L and reassess given her CHF history and the overall suspicion that her lactate is more a function of her profound hypoxia that is now corrected. [MB]    Clinical Course User Index [MB] Maudie Flakes, MD     7:30 AM update: On reassessment patient continues to have hypotension, earlier she seemed warm and well-perfused with a strong radial pulse and her heart rate in the 60s but at this time her heart rate is in the 90s to 100s, she feels not as well perfused, cannot palpate a radial.  Given this and her clear source of infection in the lungs, there is a growing concern for septic shock.  Will provide further crystalloid resuscitation, start low-dose pressors.  CVL obtained as described above.  Will admit to ICU.  Patient management required discussion with the following services or consulting groups:  None  Complexity of Problems Addressed Acute illness or injury that poses threat of life of bodily function  Additional Data Reviewed and Analyzed Further history obtained from: EMS on arrival  Additional Factors Impacting ED Encounter Risk Consideration of hospitalization  Barth Kirks. [place]onia Small, [place] [email or web address]  Final Clinical Impressions(s) / ED Diagnoses     ICD-10-CM   1. Acute respiratory failure with hypoxia and hypercapnia (HCC)  J96.01    J96.02     2. Septic shock [facility])  A41.9    R65.21       ED Discharge Orders     None        Discharge  Instructions Discussed with and Provided to Patient:   Discharge Instructions   None      Maudie Flakes, MD 09/11/21 9379    Maudie Flakes, MD 09/11/21 0240    Maudie Flakes, MD 09/11/21 [phone]

## 2021-09-11 NOTE — ED Notes (Signed)
Family updated as to patient's status.

## 2021-09-11 NOTE — Progress Notes (Signed)
Pharmacy Antibiotic Note  Terri Casey is a 61 y.o. female admitted on 09/11/2021 with  aspiration pneumonia .  Pharmacy has been consulted for Zosyn dosing.  Plan: Zosyn 3.375g IV q8h (4 hour infusion).  Height: '5\' 9"'$  (175.3 cm) IBW/kg (Calculated) : 66.2  Temp (24hrs), Avg:97 F (36.1 C), Min:94.1 F (34.5 C), Max:98.4 F (36.9 C)  Recent Labs  Lab 09/11/21 0446 09/11/21 0645  WBC 14.8*  --   CREATININE 1.28*  --   LATICACIDVEN 6.0* 4.8*    Estimated Creatinine Clearance: 58 mL/min (A) (by C-G formula based on SCr of 1.28 mg/dL (H)).    Allergies  Allergen Reactions   Estrogens Hives   Mustard Seed Hives   Strawberry Extract Hives    Antimicrobials this admission: Zosyn 9/11 >> CTX/azith 9/11  Microbiology results: 9/11 BCx: pending  Thank you for allowing pharmacy to be a part of this patient's care.  Margot Ables, PharmD Clinical Pharmacist 09/11/2021 1:17 PM

## 2021-09-11 NOTE — ED Notes (Signed)
Daughter Bubba Hales [phone] would like an update asap

## 2021-09-11 NOTE — ED Notes (Addendum)
Pt bp dropped to 93/61. Levo turned back on to 2.5 mcg.

## 2021-09-11 NOTE — Sepsis Progress Note (Signed)
Discussed more IVF with the ED physician, for sepsis protocol. He feels her lactic acid is elevated d/t respiratory failure. She has a Hx of CHF and he does not feel the additional fluid is appropriate at this time. Will continue to monitor.

## 2021-09-11 NOTE — ED Provider Notes (Signed)
I was informed by critical care medicine that patient was going to need intubation and transfer to main campus.  Patient has a history of difficult airway.  I attempted to consent the patient, she agreed with need for intubation to protect her breathing as she feels very short of breath.  However given her mental status, additionally contacted patient's son and explained the situation.  Despite her critical airway status, he agrees with need for proceeding with intubation.  See further documentation for RSI procedure.   Tretha Sciara, MD 09/11/21 1345

## 2021-09-11 NOTE — Progress Notes (Signed)
Arterial line attempted with no success. Pressure held over sites with no hematoma.

## 2021-09-11 NOTE — Progress Notes (Signed)
RN and RT transported pt from ED to 3M09 without any complications.

## 2021-09-11 NOTE — ED Notes (Signed)
MD at bedside to intubate pt.

## 2021-09-11 NOTE — ED Notes (Signed)
Pt bib [facility] from [facility] after being found down with agonal respirations. Pt arrives intubated with OG tube, temp foley, and R IJ triple lumen in place. Propofol infusing at 14mg/kg/min. Levophed infusing 10 mcg/min. Lactated Ringers infusing at 1078mhr. 50 mcg fentanyl given by [facility] en route. Vitals stable per [facility].

## 2021-09-11 NOTE — ED Provider Notes (Signed)
[facility] EMERGENCY DEPARTMENT Provider Note   CSN: [id number] Arrival date & time: 09/11/21  0441     History  Chief Complaint  Patient presents with   Shortness of Breath    Terri Casey is a 61 y.o. female.  HPI     Home Medications Prior to Admission medications   Medication Sig Start Date End Date Taking? Authorizing Provider  albuterol (PROAIR HFA) 108 (90 Base) MCG/ACT inhaler 2 puffs every 4 hours as needed only  if your can't catch your breath 03/21/21  Yes Johnson, Clanford L, MD  albuterol (PROVENTIL) (2.5 MG/3ML) 0.083% nebulizer solution Take 3 mLs (2.5 mg total) by nebulization every 6 (six) hours as needed for wheezing or shortness of breath. 03/21/21  Yes Johnson, Clanford L, MD  amLODipine (NORVASC) 10 MG tablet Take 10 mg by mouth daily. 02/28/21  Yes [provider]  atorvastatin (LIPITOR) 20 MG tablet Take 20 mg by mouth at bedtime.    Yes [provider]  bethanechol (URECHOLINE) 5 MG tablet Take 5 mg by mouth 2 (two) times daily. 08/06/19  Yes [provider]  cyclobenzaprine (FLEXERIL) 5 MG tablet Take 5 mg by mouth 3 (three) times daily. 02/23/19  Yes [provider]  escitalopram (LEXAPRO) 10 MG tablet Take 1 tablet by mouth daily. 05/26/20  Yes [provider]  folic acid (FOLVITE) 1 MG tablet Take 1 tablet (1 mg total) by mouth daily. 10/11/19  Yes Tat, Shanon Brow, MD  furosemide (LASIX) 40 MG tablet Take 1 tablet (40 mg total) by mouth daily. 10/11/19  Yes Tat, Shanon Brow, MD  levothyroxine (SYNTHROID) 200 MCG tablet Take 1 tablet (200 mcg total) by mouth daily at 6 (six) AM. 06/20/19  Yes Swayze, Ava, DO  liothyronine (CYTOMEL) 25 MCG tablet Take 25 mcg by mouth daily. 07/31/21  Yes [provider]  loratadine (CLARITIN) 10 MG tablet Take 10 mg by mouth daily.   Yes [provider]  metoprolol tartrate (LOPRESSOR) 25 MG tablet Take 0.5 tablets (12.5 mg total) by mouth 2 (two) times daily. 10/10/19  Yes  Tat, Shanon Brow, MD  oxyCODONE-acetaminophen (PERCOCET) 10-325 MG tablet Take 1 tablet by mouth 4 (four) times daily. 09/06/21  Yes [provider]  pantoprazole (PROTONIX) 40 MG tablet Take 1 tablet (40 mg total) by mouth daily. 06/20/19  Yes Swayze, Ava, DO  SUMAtriptan (IMITREX) 50 MG tablet Take 50 mg by mouth every 2 (two) hours as needed for migraine. 07/31/21  Yes [provider]  topiramate (TOPAMAX) 100 MG tablet Take 100 mg by mouth 2 (two) times daily. 06/01/20  Yes [provider]  XARELTO 20 MG TABS tablet Take 20 mg by mouth daily. 05/08/19  Yes [provider]  clobetasol cream (TEMOVATE) 0.05 % Apply topically. Apply as directed. Patient not taking: Reported on 09/11/2021 03/15/20   [provider]  HYDROcodone-acetaminophen (NORCO/VICODIN) 5-325 MG tablet Take 1 tablet by mouth every 6 (six) hours as needed for moderate pain. Patient not taking: Reported on 09/11/2021 09/05/21   Mordecai Rasmussen, MD  predniSONE (DELTASONE) 20 MG tablet Take 3 PO QAM x5days, 2 PO QAM x5days, 1 PO QAM x5days Patient not taking: Reported on 09/11/2021 03/22/21   Murlean Iba, MD      Allergies    Estrogens, Mustard seed, and Strawberry extract    Review of Systems   Review of Systems  Physical Exam Updated Vital Signs BP 113/68   Pulse 67   Temp 99.1 F (37.3  C)   Resp 20   Ht _0  (1.753 m)   SpO2 99%   BMI 33.23 kg/m  Physical Exam  ED Results / Procedures / Treatments   Labs (all labs ordered are listed, but only abnormal results are displayed) Labs Reviewed  CBC - Abnormal; Notable for the following components:      Result Value   WBC 14.8 (*)    HCT 47.6 (*)    MCV 115.8 (*)    MCHC 27.3 (*)    All other components within normal limits  COMPREHENSIVE METABOLIC PANEL - Abnormal; Notable for the following components:   Chloride 94 (*)    CO2 35 (*)    Glucose, Bld 254 (*)    Creatinine, Ser 1.28 (*)    Calcium 8.8 (*)    Albumin 3.2 (*)     Alkaline Phosphatase 131 (*)    GFR, Estimated 47 (*)    All other components within normal limits  BRAIN NATRIURETIC PEPTIDE - Abnormal; Notable for the following components:   B Natriuretic Peptide 126.0 (*)    All other components within normal limits  LACTIC ACID, PLASMA - Abnormal; Notable for the following components:   Lactic Acid, Venous 6.0 (*)    All other components within normal limits  PROTIME-INR - Abnormal; Notable for the following components:   Prothrombin Time 19.4 (*)    INR 1.7 (*)    All other components within normal limits  BLOOD GAS, ARTERIAL - Abnormal; Notable for the following components:   pH, Arterial 7.25 (*)    pCO2 arterial 87 (*)    pO2, Arterial 57 (*)    Bicarbonate 38.1 (*)    Acid-Base Excess 7.3 (*)    All other components within normal limits  URINALYSIS, ROUTINE W REFLEX MICROSCOPIC - Abnormal; Notable for the following components:   Color, Urine AMBER (*)    APPearance CLOUDY (*)    Glucose, UA 50 (*)    Protein, ur 100 (*)    Bacteria, UA RARE (*)    Non Squamous Epithelial 0-5 (*)    All other components within normal limits  LACTIC ACID, PLASMA - Abnormal; Notable for the following components:   Lactic Acid, Venous 4.8 (*)    All other components within normal limits  BLOOD GAS, ARTERIAL - Abnormal; Notable for the following components:   pH, Arterial 7.3 (*)    pCO2 arterial 84 (*)    pO2, Arterial 54 (*)    Bicarbonate 41.8 (*)    Acid-Base Excess 11.3 (*)    All other components within normal limits  TSH - Abnormal; Notable for the following components:   TSH 68.265 (*)    All other components within normal limits  CBG MONITORING, ED - Abnormal; Notable for the following components:   Glucose-Capillary 233 (*)    All other components within normal limits  CULTURE, BLOOD (ROUTINE X 2)  CULTURE, BLOOD (ROUTINE X 2)  LACTIC ACID, PLASMA  PROCALCITONIN  BLOOD GAS, ARTERIAL  HEMOGLOBIN A1C  STREP PNEUMONIAE URINARY  ANTIGEN  LEGIONELLA PNEUMOPHILA SEROGP 1 UR AG  CORTISOL  CBG MONITORING, ED  TROPONIN I (HIGH SENSITIVITY)  TROPONIN I (HIGH SENSITIVITY)    EKG EKG Interpretation  Date/Time:  Monday September 11 2021 04:52:38 EDT Ventricular Rate:  66 PR Interval:  164 QRS Duration: 122 QT Interval:  421 QTC Calculation: 442 R Axis:   112 Text Interpretation: Sinus rhythm Nonspecific intraventricular conduction delay Nonspecific T abnormalities, lateral leads  Confirmed by Gerlene Fee [phone]) on 09/11/2021 5:04:13 AM  Radiology DG Chest Port 1V same Day  Result Date: 09/11/2021 CLINICAL DATA:  Post intubation. EXAM: PORTABLE CHEST 1 VIEW COMPARISON:  September 11, 2021 FINDINGS: EKG leads project over the chest. Endotracheal tube in place approximately 2.9 cm from the expected location of the carina. LEFT mainstem bronchus showed material within the lumen on the prior study. Potential endotracheal material based on the appearance of the tracheal air column on the current exam. RIGHT IJ central venous catheter remains in place, tip in the area of the low SVC. Gastric tube coursing through in off field of the radiograph into the upper abdomen. Cardiomediastinal contours and hilar structures are stable. Persistent LEFT lower lobe consolidation and potential small LEFT effusion. No pneumothorax. Volume loss in the LEFT chest leads to shift of mediastinal structures from RIGHT to LEFT. On limited assessment no acute skeletal process. IMPRESSION: 1. Endotracheal tube in place approximately 2.9 cm from the expected location of the carina. Carina difficult to localize due to presence of material within the LEFT mainstem bronchus and likely potentially within the tracheal air column. Tip of the endotracheal tube is approximately 1.7 cm below the clavicular heads. 2. Constellation of findings again raising the question of aspiration pneumonia. Given endobronchial material would suggest pulmonary consultation. 3.  Follow-up is suggested to ensure resolution of above findings. Electronically Signed   By: Zetta Bills M.D.   On: 09/11/2021 15:17   DG Chest Port 1 View  Result Date: 09/11/2021 CLINICAL DATA:  Placement of central venous catheter EXAM: PORTABLE CHEST 1 VIEW COMPARISON:  Previous studies including the examination done earlier today FINDINGS: There is decreased volume in the left lung. There is shift of mediastinum to the left due to decreased volume in the left lung. There is large infiltrate in left lower lung field obscuring the left hemidiaphragm. There are no signs of pulmonary edema or focal infiltrates in right lung. There is interval placement of right IJ central venous catheter with its tip in superior vena cava. There is no pneumothorax. IMPRESSION: Increased density in left lower lung field suggests pleural effusion and atelectasis/pneumonia. There is decreased volume in the left lung. Tip of right IJ central venous catheter is seen in superior vena cava. There is no pneumothorax. Electronically Signed   By: Elmer Picker M.D.   On: 09/11/2021 08:10   CT HEAD WO CONTRAST (5MM)  Result Date: 09/11/2021 CLINICAL DATA:  Neck trauma, dangerous injury mechanism with head trauma. Pulmonary embolism suspected. High probability. EXAM: CT HEAD WITHOUT CONTRAST CT CERVICAL SPINE WITHOUT CONTRAST CT ANGIOGRAPHY CHEST WITH CONTRAST TECHNIQUE: Multidetector CT imaging of the head was performed using the standard protocol without the use of intravenous contrast. Multiplanar CT image reconstructions were created from the axial data set. Multidetector CT imaging of the cervical spine was performed using the standard protocol without the use of intravenous contrast. Multiplanar CT image reconstructions were created from the axial data set. Multidetector CT imaging of the chest was performed using the standard protocol during bolus administration of intravenous contrast. Multiplanar CT image  reconstructions and MIPs were obtained to evaluate the vascular anatomy. RADIATION DOSE REDUCTION: This exam was performed according to the departmental dose-optimization program which includes automated exposure control, adjustment of the mA and/or kV according to patient size and/or use of iterative reconstruction technique. CONTRAST:  75 mL Isovue 370 IV. COMPARISON:  No prior head CT or cervical spine CT for comparison. Comparison is made with  portable chest today, portable chest 08/29/2021, CTA chest 03/26/2015. FINDINGS: CT HEAD WITHOUT CONTRAST FINDINGS Brain: There is mild cerebral atrophy. Cerebellum and brainstem are unremarkable. Gray-white matter differentiation and attenuation appear normal. No infarct, hemorrhage or mass are seen. The ventricles are normal in size and position. Vascular: No hyperdense vessels are unexpected calcifications. Skull: No visible scalp hematoma. Negative for fractures or focal lesions. Sinuses/orbits: Unremarkable orbital contents. There are scattered opacities in the ethmoid air cells. Other sinuses, bilateral mastoid air cells are clear. Other: None. CT CERVICAL SPINE FINDINGS Alignment: Normal. Skull base and vertebrae: Intact, with osteopenia. No focal or significant bone lesion. Discogenic Schmorl's nodes C6-7 incidentally noted. Soft tissues and spinal canal: No spinal canal hematoma is seen. There is no precervical soft tissue swelling. There are mild calcifications in the proximal cervical ICAs. Disc levels: Mild disc space loss C3-4 and C5-6, mild to moderate disc narrowing C6-7 with small bidirectional osteophytes at C5-6 and C6-7. No herniated discs or cord compromise. No significant facet hypertrophy or foraminal narrowing. Other: None. CTA CHEST FINDINGS Cardiovascular: Mild cardiomegaly is increased from 2017. There previously was a pericardial effusion which is no longer seen. There are trace calcifications in the right coronary artery. There are no other  visible coronary calcifications. There is aortic atherosclerosis without stenosis, aneurysm or dissection. The great vessels are clear. Ectatic pulmonary trunk 3.6 cm indicating arterial hypertension, previously 2.9 cm. No arterial embolism is seen. The pulmonary veins are normal caliber. Mediastinum/Nodes: Left chest volume loss with left mediastinal shift. There are mildly enlarged lymph nodes along the left hilum, largest 1.4 cm in short axis. No bulky or encasing adenopathy. There are left-sided prevascular lymph nodes up to 9 mm, subcarinal nodes up to 1.1 cm. Thyroid gland markedly atrophic without mass. There is no axillary mass. Thoracic esophagus is unremarkable. The tracheal air column is clear. Lungs/Pleura: Small left pleural effusion extends subpulmonic, some layering posteriorly. No right pleural fluid or pneumothorax. The lungs are moderately emphysematous with centrilobular changes predominating. There is fluid and debris obstructing the mid to distal left main bronchus and extending into the lower lobe first order segmental bronchi. The upper lobe main bronchi are not well seen and probably also contain fluid and debris. There is consolidation in the inferior/lateral lingula, dense consolidation with air bronchograms in left lower lobe basal segments, some degree of atelectasis also evidenced by leftward mediastinal shift. Right lung central bronchi are patent. There is bronchial thickening in the right middle lobe with consolidation in the medial segment medially. There is additional small paraspinal consolidation in the right lower lobe medial basal segment. There is an irregular 7 mm partially pleural-based nodule in the lateral aspect of the right upper lobe apex on 6:36. Remaining lungs are clear. Upper Abdomen: There is mild hepatic steatosis. No acute abnormality. Musculoskeletal: Mild thoracic kyphodextroscoliosis. The ribcage is intact. Osteopenia and thoracic spondylosis. Review of the MIP  images confirms the above findings. IMPRESSION: 1. No acute intracranial CT findings or depressed skull fractures. 2. Scattered ethmoid sinus opacification. 3. Osteopenia and degenerative change of the cervical spine without evidence of fractures or malalignment. 4. Cardiomegaly and prominent pulmonary trunk. No arterial embolism is seen. Trace CAD. 5. Aortic atherosclerosis. 6. Fluid and debris obstructing the left main bronchus and extending into the left upper and lower lobes with left lung volume loss as well as lingular and left lower lobe basal consolidation, with underlying pleural effusion. Findings compatible with a postobstructive pneumonia possibly related to aspiration, with  likely reactive adenopathy. A follow-up study is recommended to ensure clearing after treatment. 7. Additional areas of consolidation in the right middle and lower lobes. COPD. 8. 7 mm right upper lobe irregular nodule not seen in 2017. Per Fleischner guidelines, follow-up chest CT is recommended 6-12 months, then another follow-up CT at 18-24 months. These guidelines do not apply to cancer patients and immunocompromised patients. For lung cancer screening, adhere to Lung-RADS guidelines. Electronically Signed   By: Telford Nab M.D.   On: 09/11/2021 07:46   CT CERVICAL SPINE WO CONTRAST  Result Date: 09/11/2021 CLINICAL DATA:  Neck trauma, dangerous injury mechanism with head trauma. Pulmonary embolism suspected. High probability. EXAM: CT HEAD WITHOUT CONTRAST CT CERVICAL SPINE WITHOUT CONTRAST CT ANGIOGRAPHY CHEST WITH CONTRAST TECHNIQUE: Multidetector CT imaging of the head was performed using the standard protocol without the use of intravenous contrast. Multiplanar CT image reconstructions were created from the axial data set. Multidetector CT imaging of the cervical spine was performed using the standard protocol without the use of intravenous contrast. Multiplanar CT image reconstructions were created from the axial  data set. Multidetector CT imaging of the chest was performed using the standard protocol during bolus administration of intravenous contrast. Multiplanar CT image reconstructions and MIPs were obtained to evaluate the vascular anatomy. RADIATION DOSE REDUCTION: This exam was performed according to the departmental dose-optimization program which includes automated exposure control, adjustment of the mA and/or kV according to patient size and/or use of iterative reconstruction technique. CONTRAST:  75 mL Isovue 370 IV. COMPARISON:  No prior head CT or cervical spine CT for comparison. Comparison is made with portable chest today, portable chest 08/29/2021, CTA chest 03/26/2015. FINDINGS: CT HEAD WITHOUT CONTRAST FINDINGS Brain: There is mild cerebral atrophy. Cerebellum and brainstem are unremarkable. Gray-white matter differentiation and attenuation appear normal. No infarct, hemorrhage or mass are seen. The ventricles are normal in size and position. Vascular: No hyperdense vessels are unexpected calcifications. Skull: No visible scalp hematoma. Negative for fractures or focal lesions. Sinuses/orbits: Unremarkable orbital contents. There are scattered opacities in the ethmoid air cells. Other sinuses, bilateral mastoid air cells are clear. Other: None. CT CERVICAL SPINE FINDINGS Alignment: Normal. Skull base and vertebrae: Intact, with osteopenia. No focal or significant bone lesion. Discogenic Schmorl's nodes C6-7 incidentally noted. Soft tissues and spinal canal: No spinal canal hematoma is seen. There is no precervical soft tissue swelling. There are mild calcifications in the proximal cervical ICAs. Disc levels: Mild disc space loss C3-4 and C5-6, mild to moderate disc narrowing C6-7 with small bidirectional osteophytes at C5-6 and C6-7. No herniated discs or cord compromise. No significant facet hypertrophy or foraminal narrowing. Other: None. CTA CHEST FINDINGS Cardiovascular: Mild cardiomegaly is increased  from 2017. There previously was a pericardial effusion which is no longer seen. There are trace calcifications in the right coronary artery. There are no other visible coronary calcifications. There is aortic atherosclerosis without stenosis, aneurysm or dissection. The great vessels are clear. Ectatic pulmonary trunk 3.6 cm indicating arterial hypertension, previously 2.9 cm. No arterial embolism is seen. The pulmonary veins are normal caliber. Mediastinum/Nodes: Left chest volume loss with left mediastinal shift. There are mildly enlarged lymph nodes along the left hilum, largest 1.4 cm in short axis. No bulky or encasing adenopathy. There are left-sided prevascular lymph nodes up to 9 mm, subcarinal nodes up to 1.1 cm. Thyroid gland markedly atrophic without mass. There is no axillary mass. Thoracic esophagus is unremarkable. The tracheal air column is clear. Lungs/Pleura:  Small left pleural effusion extends subpulmonic, some layering posteriorly. No right pleural fluid or pneumothorax. The lungs are moderately emphysematous with centrilobular changes predominating. There is fluid and debris obstructing the mid to distal left main bronchus and extending into the lower lobe first order segmental bronchi. The upper lobe main bronchi are not well seen and probably also contain fluid and debris. There is consolidation in the inferior/lateral lingula, dense consolidation with air bronchograms in left lower lobe basal segments, some degree of atelectasis also evidenced by leftward mediastinal shift. Right lung central bronchi are patent. There is bronchial thickening in the right middle lobe with consolidation in the medial segment medially. There is additional small paraspinal consolidation in the right lower lobe medial basal segment. There is an irregular 7 mm partially pleural-based nodule in the lateral aspect of the right upper lobe apex on 6:36. Remaining lungs are clear. Upper Abdomen: There is mild hepatic  steatosis. No acute abnormality. Musculoskeletal: Mild thoracic kyphodextroscoliosis. The ribcage is intact. Osteopenia and thoracic spondylosis. Review of the MIP images confirms the above findings. IMPRESSION: 1. No acute intracranial CT findings or depressed skull fractures. 2. Scattered ethmoid sinus opacification. 3. Osteopenia and degenerative change of the cervical spine without evidence of fractures or malalignment. 4. Cardiomegaly and prominent pulmonary trunk. No arterial embolism is seen. Trace CAD. 5. Aortic atherosclerosis. 6. Fluid and debris obstructing the left main bronchus and extending into the left upper and lower lobes with left lung volume loss as well as lingular and left lower lobe basal consolidation, with underlying pleural effusion. Findings compatible with a postobstructive pneumonia possibly related to aspiration, with likely reactive adenopathy. A follow-up study is recommended to ensure clearing after treatment. 7. Additional areas of consolidation in the right middle and lower lobes. COPD. 8. 7 mm right upper lobe irregular nodule not seen in 2017. Per Fleischner guidelines, follow-up chest CT is recommended 6-12 months, then another follow-up CT at 18-24 months. These guidelines do not apply to cancer patients and immunocompromised patients. For lung cancer screening, adhere to Lung-RADS guidelines. Electronically Signed   By: Telford Nab M.D.   On: 09/11/2021 07:46   CT Angio Chest Pulmonary Embolism (PE) W or WO Contrast  Result Date: 09/11/2021 CLINICAL DATA:  Neck trauma, dangerous injury mechanism with head trauma. Pulmonary embolism suspected. High probability. EXAM: CT HEAD WITHOUT CONTRAST CT CERVICAL SPINE WITHOUT CONTRAST CT ANGIOGRAPHY CHEST WITH CONTRAST TECHNIQUE: Multidetector CT imaging of the head was performed using the standard protocol without the use of intravenous contrast. Multiplanar CT image reconstructions were created from the axial data set.  Multidetector CT imaging of the cervical spine was performed using the standard protocol without the use of intravenous contrast. Multiplanar CT image reconstructions were created from the axial data set. Multidetector CT imaging of the chest was performed using the standard protocol during bolus administration of intravenous contrast. Multiplanar CT image reconstructions and MIPs were obtained to evaluate the vascular anatomy. RADIATION DOSE REDUCTION: This exam was performed according to the departmental dose-optimization program which includes automated exposure control, adjustment of the mA and/or kV according to patient size and/or use of iterative reconstruction technique. CONTRAST:  75 mL Isovue 370 IV. COMPARISON:  No prior head CT or cervical spine CT for comparison. Comparison is made with portable chest today, portable chest 08/29/2021, CTA chest 03/26/2015. FINDINGS: CT HEAD WITHOUT CONTRAST FINDINGS Brain: There is mild cerebral atrophy. Cerebellum and brainstem are unremarkable. Gray-white matter differentiation and attenuation appear normal. No infarct, hemorrhage or  mass are seen. The ventricles are normal in size and position. Vascular: No hyperdense vessels are unexpected calcifications. Skull: No visible scalp hematoma. Negative for fractures or focal lesions. Sinuses/orbits: Unremarkable orbital contents. There are scattered opacities in the ethmoid air cells. Other sinuses, bilateral mastoid air cells are clear. Other: None. CT CERVICAL SPINE FINDINGS Alignment: Normal. Skull base and vertebrae: Intact, with osteopenia. No focal or significant bone lesion. Discogenic Schmorl's nodes C6-7 incidentally noted. Soft tissues and spinal canal: No spinal canal hematoma is seen. There is no precervical soft tissue swelling. There are mild calcifications in the proximal cervical ICAs. Disc levels: Mild disc space loss C3-4 and C5-6, mild to moderate disc narrowing C6-7 with small bidirectional  osteophytes at C5-6 and C6-7. No herniated discs or cord compromise. No significant facet hypertrophy or foraminal narrowing. Other: None. CTA CHEST FINDINGS Cardiovascular: Mild cardiomegaly is increased from 2017. There previously was a pericardial effusion which is no longer seen. There are trace calcifications in the right coronary artery. There are no other visible coronary calcifications. There is aortic atherosclerosis without stenosis, aneurysm or dissection. The great vessels are clear. Ectatic pulmonary trunk 3.6 cm indicating arterial hypertension, previously 2.9 cm. No arterial embolism is seen. The pulmonary veins are normal caliber. Mediastinum/Nodes: Left chest volume loss with left mediastinal shift. There are mildly enlarged lymph nodes along the left hilum, largest 1.4 cm in short axis. No bulky or encasing adenopathy. There are left-sided prevascular lymph nodes up to 9 mm, subcarinal nodes up to 1.1 cm. Thyroid gland markedly atrophic without mass. There is no axillary mass. Thoracic esophagus is unremarkable. The tracheal air column is clear. Lungs/Pleura: Small left pleural effusion extends subpulmonic, some layering posteriorly. No right pleural fluid or pneumothorax. The lungs are moderately emphysematous with centrilobular changes predominating. There is fluid and debris obstructing the mid to distal left main bronchus and extending into the lower lobe first order segmental bronchi. The upper lobe main bronchi are not well seen and probably also contain fluid and debris. There is consolidation in the inferior/lateral lingula, dense consolidation with air bronchograms in left lower lobe basal segments, some degree of atelectasis also evidenced by leftward mediastinal shift. Right lung central bronchi are patent. There is bronchial thickening in the right middle lobe with consolidation in the medial segment medially. There is additional small paraspinal consolidation in the right lower lobe  medial basal segment. There is an irregular 7 mm partially pleural-based nodule in the lateral aspect of the right upper lobe apex on 6:36. Remaining lungs are clear. Upper Abdomen: There is mild hepatic steatosis. No acute abnormality. Musculoskeletal: Mild thoracic kyphodextroscoliosis. The ribcage is intact. Osteopenia and thoracic spondylosis. Review of the MIP images confirms the above findings. IMPRESSION: 1. No acute intracranial CT findings or depressed skull fractures. 2. Scattered ethmoid sinus opacification. 3. Osteopenia and degenerative change of the cervical spine without evidence of fractures or malalignment. 4. Cardiomegaly and prominent pulmonary trunk. No arterial embolism is seen. Trace CAD. 5. Aortic atherosclerosis. 6. Fluid and debris obstructing the left main bronchus and extending into the left upper and lower lobes with left lung volume loss as well as lingular and left lower lobe basal consolidation, with underlying pleural effusion. Findings compatible with a postobstructive pneumonia possibly related to aspiration, with likely reactive adenopathy. A follow-up study is recommended to ensure clearing after treatment. 7. Additional areas of consolidation in the right middle and lower lobes. COPD. 8. 7 mm right upper lobe irregular nodule not seen in  2017. Per Fleischner guidelines, follow-up chest CT is recommended 6-12 months, then another follow-up CT at 18-24 months. These guidelines do not apply to cancer patients and immunocompromised patients. For lung cancer screening, adhere to Lung-RADS guidelines. Electronically Signed   By: Telford Nab M.D.   On: 09/11/2021 07:46   DG Chest Port 1 View  Result Date: 09/11/2021 CLINICAL DATA:  Shortness of breath and weakness. EXAM: PORTABLE CHEST 1 VIEW COMPARISON:  Portable chest 08/29/2021. FINDINGS: There is increased patchy consolidation in the left lower lung field, small underlying left pleural effusion, findings consistent with left  lower lobe pneumonia and parapneumonic effusion. The remaining lungs clear with COPD change. There is mild cardiomegaly without evidence of CHF. Tortuous aorta with stable mediastinum. Osteopenia. Patient is rotated to the right. IMPRESSION: Left lower lobe patchy consolidation and small underlying pleural fluid consistent with left lower lobe pneumonia or aspiration and parapneumonic effusion. Clinical correlation and radiographic follow-up recommended.  COPD. Electronically Signed   By: Telford Nab M.D.   On: 09/11/2021 05:30    Procedures .Critical Care  Performed by: Tretha Sciara, MD Authorized by: Tretha Sciara, MD   Critical care provider statement:    Critical care time (minutes):  35   Critical care was necessary to treat or prevent imminent or life-threatening deterioration of the following conditions:  Cardiac failure, sepsis, shock and CNS failure or compromise   Critical care was time spent personally by me on the following activities:  Development of treatment plan with patient or surrogate, blood draw for specimens, ordering and performing treatments and interventions, ordering and review of laboratory studies, ordering and review of radiographic studies, evaluation of patient's response to treatment and re-evaluation of patient's condition   Care discussed with: accepting provider at another facility   Procedure Name: Intubation Date/Time: 09/11/2021 4:12 PM  Performed by: Tretha Sciara, MDPre-anesthesia Checklist: Patient identified, Emergency Drugs available and Timeout performed Oxygen Delivery Method: Ambu bag Preoxygenation: Pre-oxygenation with 100% oxygen Induction Type: IV induction and Rapid sequence Ventilation: Mask ventilation with difficulty and Two handed mask ventilation required Laryngoscope Size: 3 and Mac Grade View: Grade I Tube size: 7.5 mm Number of attempts: 1 Airway Equipment and Method: Video-laryngoscopy Placement Confirmation: ETT  inserted through vocal cords under direct vision Secured at: 23.5 cm Tube secured with: ETT holder Dental Injury: Teeth and Oropharynx as per pre-operative assessment         Medications Ordered in ED Medications  LORazepam (ATIVAN) injection 1 mg (1 mg Intravenous Not Given 09/11/21 0817)  lactated ringers infusion ( Intravenous Infusion Verify 09/11/21 1418)  norepinephrine (LEVOPHED) 76m in 2533m(0.016 mg/mL) premix infusion (28 mcg/min Intravenous Rate/Dose Change 09/11/21 1508)  heparin injection 5,000 Units (5,000 Units Subcutaneous Given 09/11/21 1344)  pantoprazole (PROTONIX) injection 40 mg (40 mg Intravenous Given 09/11/21 1347)  levalbuterol (XOPENEX) nebulizer solution 0.63 mg (0.63 mg Nebulization Given 09/11/21 1417)  acetaminophen (TYLENOL) tablet 650 mg (has no administration in time range)    Or  acetaminophen (TYLENOL) suppository 650 mg (has no administration in time range)  ondansetron (ZOFRAN) tablet 4 mg (has no administration in time range)    Or  ondansetron (ZOFRAN) injection 4 mg (has no administration in time range)  insulin aspart (novoLOG) injection 0-15 Units ( Subcutaneous Not Given 09/11/21 1325)  piperacillin-tazobactam (ZOSYN) IVPB 3.375 g (has no administration in time range)  fentaNYL (SUBLIMAZE) injection 50 mcg (50 mcg Intravenous Given 09/11/21 1428)  propofol (DIPRIVAN) 1000 MG/100ML infusion (has no administration in  time range)  vasopressin (PITRESSIN) 20 Units in sodium chloride 0.9 % 100 mL infusion-*FOR SHOCK* (has no administration in time range)  ipratropium-albuterol (DUONEB) 0.5-2.5 (3) MG/3ML nebulizer solution 3 mL (3 mLs Nebulization Given 09/11/21 0519)  ipratropium-albuterol (DUONEB) 0.5-2.5 (3) MG/3ML nebulizer solution 3 mL (3 mLs Nebulization Given 09/11/21 0519)  iohexol (OMNIPAQUE) 350 MG/ML injection 100 mL (100 mLs Intravenous Contrast Given 09/11/21 0711)  cefTRIAXone (ROCEPHIN) 2 g in sodium chloride 0.9 % 100 mL IVPB (0 g  Intravenous Stopped 09/11/21 0654)  lactated ringers bolus 1,000 mL (0 mLs Intravenous Stopped 09/11/21 0759)  ipratropium (ATROVENT) 0.02 % nebulizer solution (0.5 mg  Given 09/11/21 0543)  albuterol (PROVENTIL) (2.5 MG/3ML) 0.083% nebulizer solution (5 mg  Given 09/11/21 0542)  lactated ringers bolus 1,000 mL (0 mLs Intravenous Stopped 09/11/21 1301)  LORazepam (ATIVAN) injection 2 mg (2 mg Intravenous Given 09/11/21 0810)  piperacillin-tazobactam (ZOSYN) IVPB 3.375 g (0 g Intravenous Stopped 09/11/21 1407)  etomidate (AMIDATE) injection 20 mg (20 mg Intravenous Given 09/11/21 1410)  succinylcholine (ANECTINE) syringe 100 mg (100 mg Intravenous Given 09/11/21 1411)  lactated ringers bolus 1,000 mL (1,000 mLs Intravenous New Bag/Given 09/11/21 1509)    ED Course/ Medical Decision Making/ A&P Clinical Course as of 09/11/21 1611  Mon Sep 11, 2021  0609 Patient is hypothermic, has a leukocytosis, lactate of 6, having some soft blood pressures at this time.  Sepsis is also considered, code sepsis initiated, starting IV antibiotics.  Will start with judicious fluids, 1 L and reassess given her CHF history and the overall suspicion that her lactate is more a function of her profound hypoxia that is now corrected. [MB]  0807 [place]: COPD/CHF Found AMS. On levo. Has right IJ. On BiPAP. Trying to stave off intubation. COPDer.  [CC]    Clinical Course User Index [CC] Tretha Sciara, MD [MB] Sedonia Small Barth Kirks, MD                           Medical Decision Making Amount and/or Complexity of Data Reviewed Labs: ordered. Radiology: ordered. ECG/medicine tests: ordered.  Risk Prescription drug management. Decision regarding hospitalization.   I received care of this patient from prior provider.  Please see their note for ultimate H&P and initial findings.  In short, this is a patient with sepsis secondary to complex parapneumonic effusion.  Critical care recommended treatment with antibiotics, transfer  for bronchoscopy.  Critical care requested intubation which I believe is reasonable given patient's clinical deterioration on BiPAP.  Intubation uncomplicated proceeded as above. Patient required close observation and management.  Required titration of vasopressors.  Will add on vasopressin due to continued shock.  Further IV fluids.  Patient required frequent consultation with critical care team and was ultimately accepted at [facility] on an ED to ED transfer for emergent bronc and further work-up.  Patient patient continues to be pending transfer at this time.        Final Clinical Impression(s) / ED Diagnoses Final diagnoses:  Acute respiratory failure with hypoxia and hypercapnia ([place])  Septic shock ([place])    Rx / DC Orders ED Discharge Orders     None         Tretha Sciara, MD 09/11/21 1614

## 2021-09-12 ENCOUNTER — Inpatient Hospital Stay (HOSPITAL_COMMUNITY): Payer: Medicaid Other

## 2021-09-12 DIAGNOSIS — R6521 Severe sepsis with septic shock: Secondary | ICD-10-CM | POA: Diagnosis not present

## 2021-09-12 DIAGNOSIS — A419 Sepsis, unspecified organism: Secondary | ICD-10-CM | POA: Diagnosis not present

## 2021-09-12 LAB — POCT I-STAT 7, (LYTES, BLD GAS, ICA,H+H)
Acid-Base Excess: 12 mmol/L — ABNORMAL HIGH (ref 0.0–2.0)
Acid-Base Excess: 8 mmol/L — ABNORMAL HIGH (ref 0.0–2.0)
Bicarbonate: 37.5 mmol/L — ABNORMAL HIGH (ref 20.0–28.0)
Bicarbonate: 32.6 mmol/L — ABNORMAL HIGH (ref 20.0–28.0)
Calcium, Ion: 1.14 mmol/L — ABNORMAL LOW (ref 1.15–1.40)
Calcium, Ion: 1.11 mmol/L — ABNORMAL LOW (ref 1.15–1.40)
HCT: 35 % — ABNORMAL LOW (ref 36.0–46.0)
HCT: 36 % (ref 36.0–46.0)
Hemoglobin: 11.9 g/dL — ABNORMAL LOW (ref 12.0–15.0)
Hemoglobin: 12.2 g/dL (ref 12.0–15.0)
O2 Saturation: 97 %
O2 Saturation: 90 %
Patient temperature: 37.5
Potassium: 3.8 mmol/L (ref 3.5–5.1)
Potassium: 3 mmol/L — ABNORMAL LOW (ref 3.5–5.1)
Sodium: 135 mmol/L (ref 135–145)
Sodium: 137 mmol/L (ref 135–145)
TCO2: 39 mmol/L — ABNORMAL HIGH (ref 22–32)
TCO2: 34 mmol/L — ABNORMAL HIGH (ref 22–32)
pCO2 arterial: 54.8 mmHg — ABNORMAL HIGH (ref 32–48)
pCO2 arterial: 44.1 mmHg (ref 32–48)
pH, Arterial: 7.445 (ref 7.35–7.45)
pH, Arterial: 7.477 — ABNORMAL HIGH (ref 7.35–7.45)
pO2, Arterial: 90 mmHg (ref 83–108)
pO2, Arterial: 56 mmHg — ABNORMAL LOW (ref 83–108)

## 2021-09-12 LAB — BLOOD CULTURE ID PANEL (REFLEXED) - BCID2

## 2021-09-12 LAB — MAGNESIUM
Magnesium: 1.6 mg/dL — ABNORMAL LOW (ref 1.7–2.4)
Magnesium: 1.9 mg/dL (ref 1.7–2.4)

## 2021-09-12 LAB — LEGIONELLA PNEUMOPHILA SEROGP 1 UR AG: L. pneumophila Serogp 1 Ur Ag: NEGATIVE

## 2021-09-12 LAB — BASIC METABOLIC PANEL
Anion gap: 7 (ref 5–15)
BUN: 15 mg/dL (ref 8–23)
CO2: 35 mmol/L — ABNORMAL HIGH (ref 22–32)
Calcium: 8.6 mg/dL — ABNORMAL LOW (ref 8.9–10.3)
Chloride: 95 mmol/L — ABNORMAL LOW (ref 98–111)
Creatinine, Ser: 1.05 mg/dL — ABNORMAL HIGH (ref 0.44–1.00)
GFR, Estimated: >60 mL/min (ref >60)
Glucose, Bld: 96 mg/dL (ref 70–99)
Potassium: 3.9 mmol/L (ref 3.5–5.1)
Sodium: 137 mmol/L (ref 135–145)

## 2021-09-12 LAB — GLUCOSE, CAPILLARY
Glucose-Capillary: 100 mg/dL — ABNORMAL HIGH (ref 70–99)
Glucose-Capillary: 70 mg/dL (ref 70–99)
Glucose-Capillary: 72 mg/dL (ref 70–99)
Glucose-Capillary: 74 mg/dL (ref 70–99)
Glucose-Capillary: 84 mg/dL (ref 70–99)
Glucose-Capillary: 98 mg/dL (ref 70–99)

## 2021-09-12 LAB — CBC
HCT: 37.1 % (ref 36.0–46.0)
Hemoglobin: 11.2 g/dL — ABNORMAL LOW (ref 12.0–15.0)
MCH: 31.6 pg (ref 26.0–34.0)
MCHC: 30.2 g/dL (ref 30.0–36.0)
MCV: 104.8 fL — ABNORMAL HIGH (ref 80.0–100.0)
Platelets: 154 10*3/uL (ref 150–400)
RBC: 3.54 MIL/uL — ABNORMAL LOW (ref 3.87–5.11)
RDW: 14.8 % (ref 11.5–15.5)
WBC: 14 10*3/uL — ABNORMAL HIGH (ref 4.0–10.5)
nRBC: 0 % (ref 0.0–0.2)

## 2021-09-12 LAB — PHOSPHORUS
Phosphorus: 1.8 mg/dL — ABNORMAL LOW (ref 2.5–4.6)
Phosphorus: 3.6 mg/dL (ref 2.5–4.6)

## 2021-09-12 LAB — HEPARIN LEVEL (UNFRACTIONATED): Heparin Unfractionated: 0.51 IU/mL (ref 0.30–0.70)

## 2021-09-12 MED ORDER — BETHANECHOL CHLORIDE 5 MG PO TABS
5.0000 mg | ORAL_TABLET | Freq: Two times a day (BID) | ORAL | Status: DC
  Administered 2021-09-12 – 2021-09-13 (×4): 5 mg
  Filled 2021-09-12 (×4): qty 1

## 2021-09-12 MED ORDER — PROSOURCE TF20 ENFIT COMPATIBL EN LIQD
60.0000 mL | Freq: Three times a day (TID) | ENTERAL | Status: DC
  Administered 2021-09-12 (×3): 60 mL
  Filled 2021-09-12 (×3): qty 60

## 2021-09-12 MED ORDER — SODIUM CHLORIDE 0.9 % IV BOLUS
1000.0000 mL | Freq: Once | INTRAVENOUS | Status: AC
  Administered 2021-09-12: 1000 mL via INTRAVENOUS

## 2021-09-12 MED ORDER — HEPARIN (PORCINE) 25000 UT/250ML-% IV SOLN
1200.0000 [IU]/h | INTRAVENOUS | Status: DC
  Administered 2021-09-12 – 2021-09-13 (×2): 1200 [IU]/h via INTRAVENOUS
  Filled 2021-09-12 (×2): qty 250

## 2021-09-12 MED ORDER — ALBUTEROL SULFATE (2.5 MG/3ML) 0.083% IN NEBU: 2.5000 mg | INHALATION_SOLUTION | Freq: Four times a day (QID) | RESPIRATORY_TRACT | Status: DC | PRN

## 2021-09-12 MED ORDER — SODIUM PHOSPHATES 45 MMOLE/15ML IV SOLN
30.0000 mmol | Freq: Once | INTRAVENOUS | Status: AC
  Administered 2021-09-12: 30 mmol via INTRAVENOUS
  Filled 2021-09-12: qty 10

## 2021-09-12 MED ORDER — IPRATROPIUM-ALBUTEROL 0.5-2.5 (3) MG/3ML IN SOLN
3.0000 mL | Freq: Four times a day (QID) | RESPIRATORY_TRACT | Status: AC
  Administered 2021-09-12 – 2021-09-13 (×3): 3 mL via RESPIRATORY_TRACT
  Filled 2021-09-12 (×3): qty 3

## 2021-09-12 MED ORDER — SODIUM CHLORIDE 0.9 % IV SOLN: INTRAVENOUS | Status: DC | PRN

## 2021-09-12 MED ORDER — VITAL AF 1.2 CAL PO LIQD
1000.0000 mL | ORAL | Status: DC
  Administered 2021-09-12: 1000 mL

## 2021-09-12 MED ORDER — MAGNESIUM SULFATE 2 GM/50ML IV SOLN
2.0000 g | Freq: Once | INTRAVENOUS | Status: AC
  Administered 2021-09-12: 2 g via INTRAVENOUS
  Filled 2021-09-12: qty 50

## 2021-09-12 MED ORDER — RISPERIDONE 0.25 MG PO TABS
0.2500 mg | ORAL_TABLET | Freq: Every day | ORAL | Status: DC
  Administered 2021-09-12 – 2021-09-13 (×2): 0.25 mg
  Filled 2021-09-12 (×3): qty 1

## 2021-09-12 MED ORDER — VANCOMYCIN HCL IN DEXTROSE 1-5 GM/200ML-% IV SOLN: 1000.0000 mg | INTRAVENOUS | Status: DC

## 2021-09-12 MED ORDER — VANCOMYCIN HCL 2000 MG/400ML IV SOLN
2000.0000 mg | Freq: Once | INTRAVENOUS | Status: AC
  Administered 2021-09-12: 2000 mg via INTRAVENOUS
  Filled 2021-09-12: qty 400

## 2021-09-12 MED ORDER — ESCITALOPRAM OXALATE 10 MG PO TABS
10.0000 mg | ORAL_TABLET | Freq: Every day | ORAL | Status: DC
  Administered 2021-09-12 – 2021-09-13 (×2): 10 mg
  Filled 2021-09-12 (×2): qty 1

## 2021-09-12 MED ORDER — LACTATED RINGERS IV BOLUS: 1000.0000 mL | Freq: Once | INTRAVENOUS | Status: DC

## 2021-09-12 MED ORDER — VANCOMYCIN HCL 1250 MG/250ML IV SOLN
1250.0000 mg | INTRAVENOUS | Status: DC
  Administered 2021-09-13 – 2021-09-14 (×2): 1250 mg via INTRAVENOUS
  Filled 2021-09-12 (×2): qty 250

## 2021-09-12 MED FILL — Fentanyl Citrate Preservative Free (PF) Inj 100 MCG/2ML: INTRAMUSCULAR | Qty: 50 | Status: AC

## 2021-09-12 NOTE — Progress Notes (Signed)
ANTICOAGULATION CONSULT NOTE - Initial Consult  Pharmacy Consult for heparin  Indication: atrial fibrillation  Allergies  Allergen Reactions   Estrogens Hives   Mustard Seed Hives   Strawberry Extract Hives    Patient Measurements: Height: '5\' 9"'$  (175.3 cm) Weight: 112.5 kg (248 lb 0.3 oz) IBW/kg (Calculated) : 66.2 Heparin Dosing Weight: 88.5kg  Vital Signs: Temp: 99 F (37.2 C) (09/12 0735) Temp Source: Bladder (09/12 0735) BP: 137/76 (09/12 0809) Pulse Rate: 76 (09/12 0809)  Labs: Recent Labs    09/11/21 0446 09/11/21 0648 09/11/21 2039 09/11/21 2256 09/12/21 0308 09/12/21 0423  HGB 13.0  --    < > 12.2 11.2* 11.9*  HCT 47.6*  --    < > 36.0 37.1 35.0*  PLT 174  --   --   --  154  --   LABPROT 19.4*  --   --   --   --   --   INR 1.7*  --   --   --   --   --   CREATININE 1.28*  --   --   --  1.05*  --   TROPONINIHS 15 15  --   --   --   --    < > = values in this interval not displayed.    Estimated Creatinine Clearance: 74.3 mL/min (A) (by C-G formula based on SCr of 1.05 mg/dL (H)).   Medical History: Past Medical History:  Diagnosis Date   Anxiety    Asthma    CHF (congestive heart failure) (HCC)    COPD (chronic obstructive pulmonary disease) ([place])    Diabetes mellitus without complication (HCC)    Hypertension    Thyroid disease     Medications:  Medications Prior to Admission  Medication Sig Dispense Refill Last Dose   albuterol (PROAIR HFA) 108 (90 Base) MCG/ACT inhaler 2 puffs every 4 hours as needed only  if your can't catch your breath 1 each 5 unk   albuterol (PROVENTIL) (2.5 MG/3ML) 0.083% nebulizer solution Take 3 mLs (2.5 mg total) by nebulization every 6 (six) hours as needed for wheezing or shortness of breath. 75 mL 5 unk   amLODipine (NORVASC) 10 MG tablet Take 10 mg by mouth daily.   09/10/2021   atorvastatin (LIPITOR) 20 MG tablet Take 20 mg by mouth at bedtime.    09/10/2021   bethanechol (URECHOLINE) 5 MG tablet Take 5 mg by mouth  2 (two) times daily.   09/10/2021   cyclobenzaprine (FLEXERIL) 5 MG tablet Take 5 mg by mouth 3 (three) times daily.   09/10/2021   escitalopram (LEXAPRO) 10 MG tablet Take 1 tablet by mouth daily.   4/50/3888   folic acid (FOLVITE) 1 MG tablet Take 1 tablet (1 mg total) by mouth daily.   09/10/2021   furosemide (LASIX) 40 MG tablet Take 1 tablet (40 mg total) by mouth daily. 30 tablet 1 09/10/2021   levothyroxine (SYNTHROID) 200 MCG tablet Take 1 tablet (200 mcg total) by mouth daily at 6 (six) AM. 30 tablet 0 09/10/2021   liothyronine (CYTOMEL) 25 MCG tablet Take 25 mcg by mouth daily.   09/10/2021   loratadine (CLARITIN) 10 MG tablet Take 10 mg by mouth daily.   09/10/2021   metoprolol tartrate (LOPRESSOR) 25 MG tablet Take 0.5 tablets (12.5 mg total) by mouth 2 (two) times daily. 60 tablet 1 09/10/2021 at am   oxyCODONE-acetaminophen (PERCOCET) 10-325 MG tablet Take 1 tablet by mouth 4 (four) times daily.  09/10/2021   pantoprazole (PROTONIX) 40 MG tablet Take 1 tablet (40 mg total) by mouth daily. 30 tablet 0 09/10/2021   SUMAtriptan (IMITREX) 50 MG tablet Take 50 mg by mouth every 2 (two) hours as needed for migraine.   unk   topiramate (TOPAMAX) 100 MG tablet Take 100 mg by mouth 2 (two) times daily.   09/10/2021   XARELTO 20 MG TABS tablet Take 20 mg by mouth daily.   09/10/2021 at 0900   clobetasol cream (TEMOVATE) 0.05 % Apply topically. Apply as directed. (Patient not taking: Reported on 09/11/2021)   Not Taking   HYDROcodone-acetaminophen (NORCO/VICODIN) 5-325 MG tablet Take 1 tablet by mouth every 6 (six) hours as needed for moderate pain. (Patient not taking: Reported on 09/11/2021) 20 tablet 0 Not Taking   predniSONE (DELTASONE) 20 MG tablet Take 3 PO QAM x5days, 2 PO QAM x5days, 1 PO QAM x5days (Patient not taking: Reported on 09/11/2021) 30 tablet 0 Not Taking    Assessment: Patient is a 61 y.o female with a PMH of atrial fibrillation on Xarelto at home. Last dose 9/10 @ 09:00. Renal function  is improving, hemoglobin and platelets stable.   No bleeding noted. Some bruising noted on feet.   Goal of Therapy:  Heparin level 0.3-0.7 units/ml aPTT 66-105 seconds Monitor platelets by anticoagulation protocol: Yes   Plan:  Start heparin infusion at 1200 units/hr Will not bolus in the setting of recent DOAC intake and also heparin prophylaxis dosing.  Check anti-Xa level in 6 hours and daily while on heparin Will obtain aPTT as well, anticipate anti-Xa will not correlate in the setting of DOAC use and AKI.  Continue to monitor H&H and platelets  Terri Casey 09/12/2021,8:45 AM

## 2021-09-12 NOTE — Progress Notes (Signed)
[place] Progress Note Patient Name: Terri Casey DOB: 10/26/1960 MRN: [id number]   Date of Service  09/12/2021  HPI/Events of Note  Patient with episodic agitation, she is currently calm, plan is for am extubation if possible.  eICU Interventions  Will order PRN low dose Risperidone via the enteral tube.        Frederik Pear 09/12/2021, 8:36 PM

## 2021-09-12 NOTE — Progress Notes (Signed)
Pharmacy Antibiotic Note  Terri Casey is a 61 y.o. female admitted on 09/11/2021 with  aspiration pneumonia .  Pharmacy has been consulted for Zosyn dosing. GPC noted in 1 of 2 blood cultures, adding vancomycin for now per PCCM. Kidney function has improved since dosing, Scr down to 1.05.  Plan: Continue Zosyn 3.375g EI q8h Change vancomycin to '1250mg'$  Q24H (eAUC: 422)   Height: '5\' 9"'$  (175.3 cm) Weight: 112.5 kg (248 lb 0.3 oz) IBW/kg (Calculated) : 66.2  Temp (24hrs), Avg:99.7 F (37.6 C), Min:97.3 F (36.3 C), Max:101.2 F (38.4 C)  Recent Labs  Lab 09/11/21 0446 09/11/21 0645 09/11/21 1241 09/12/21 0308  WBC 14.8*  --   --  14.0*  CREATININE 1.28*  --   --  1.05*  LATICACIDVEN 6.0* 4.8* 1.8  --      Estimated Creatinine Clearance: 74.3 mL/min (A) (by C-G formula based on SCr of 1.05 mg/dL (H)).    Allergies  Allergen Reactions   Estrogens Hives   Mustard Seed Hives   Strawberry Extract Hives    Antimicrobials this admission: Zosyn 9/11 >> CTX/azith 9/11  Microbiology results: 9/11 BCx: GPCs  Esmeralda Arthur, PharmD  Clinical Pharmacist Please check AMION for all [facility] Pharmacy numbers 09/12/2021

## 2021-09-12 NOTE — Progress Notes (Signed)
Advanced OG tube 10 cm per MD. Waiting for x-ray confirmation.

## 2021-09-12 NOTE — Progress Notes (Signed)
Pharmacy Antibiotic Note  Terri Casey is a 61 y.o. female admitted on 09/11/2021 with  aspiration pneumonia .  Pharmacy has been consulted for Zosyn dosing. GPC noted in 1 of 2 blood cultures, adding vancomycin for now per PCCM. Cr up acutely this am.  Plan: Continue Zosyn 3.375g EI q8h Add vancomycin '2000mg'$  x1 then '1000mg'$  IV q24h - est AUC 446  Height: '5\' 9"'$  (175.3 cm) IBW/kg (Calculated) : 66.2  Temp (24hrs), Avg:99.1 F (37.3 C), Min:94.1 F (34.5 C), Max:101.2 F (38.4 C)  Recent Labs  Lab 09/11/21 0446 09/11/21 0645 09/11/21 1241  WBC 14.8*  --   --   CREATININE 1.28*  --   --   LATICACIDVEN 6.0* 4.8* 1.8     Estimated Creatinine Clearance: 58 mL/min (A) (by C-G formula based on SCr of 1.28 mg/dL (H)).    Allergies  Allergen Reactions   Estrogens Hives   Mustard Seed Hives   Strawberry Extract Hives    Antimicrobials this admission: Zosyn 9/11 >> CTX/azith 9/11  Microbiology results: 9/11 BCx: GPCs  Arrie Senate, PharmD, BCPS, [facility] Clinical Pharmacist Please check AMION for all [facility] Pharmacy numbers 09/12/2021

## 2021-09-12 NOTE — Progress Notes (Signed)
ANTICOAGULATION CONSULT NOTE Pharmacy Consult for heparin  Indication: atrial fibrillation  Allergies  Allergen Reactions   Estrogens Hives   Mustard Seed Hives   Strawberry Extract Hives    Patient Measurements: Height: '5\' 9"'$  (175.3 cm) Weight: 112.5 kg (248 lb 0.3 oz) IBW/kg (Calculated) : 66.2 Heparin Dosing Weight: 88.5kg  Vital Signs: Temp: 98.4 F (36.9 C) (09/12 1525) Temp Source: Bladder (09/12 1525) BP: 106/66 (09/12 1400) Pulse Rate: 71 (09/12 1400)  Labs: Recent Labs    09/11/21 0446 09/11/21 0648 09/11/21 2039 09/11/21 2256 09/12/21 0308 09/12/21 0423 09/12/21 1441  HGB 13.0  --    < > 12.2 11.2* 11.9*  --   HCT 47.6*  --    < > 36.0 37.1 35.0*  --   PLT 174  --   --   --  154  --   --   LABPROT 19.4*  --   --   --   --   --   --   INR 1.7*  --   --   --   --   --   --   HEPARINUNFRC  --   --   --   --   --   --  0.51  CREATININE 1.28*  --   --   --  1.05*  --   --   TROPONINIHS 15 15  --   --   --   --   --    < > = values in this interval not displayed.     Estimated Creatinine Clearance: 74.3 mL/min (A) (by C-G formula based on SCr of 1.05 mg/dL (H)).   Medical History: Past Medical History:  Diagnosis Date   Anxiety    Asthma    CHF (congestive heart failure) (HCC)    COPD (chronic obstructive pulmonary disease) ([place])    Diabetes mellitus without complication ([place])    Hypertension    Thyroid disease     Assessment: Patient is a 61 y.o female with a PMH of atrial fibrillation on Xarelto at home. Last dose 9/10 @ 09:00. Renal function is improving, hemoglobin and platelets stable.   No bleeding noted. Some bruising noted on feet.   Heparin level therapeutic   Goal of Therapy:  Heparin level 0.3-0.7 units/ml aPTT 66-105 seconds Monitor platelets by anticoagulation protocol: Yes   Plan:  Continue heparin at 1200 units / hr Next heparin level in AM  Thank you. Anette Guarneri, PharmD 09/12/2021,3:32 PM

## 2021-09-12 NOTE — Progress Notes (Signed)
Called and updated CJ Sellars (son) on patient's clinical status. All questions answered. Confirmed that topamax home medication is for migraines and that she has never had a seizure.

## 2021-09-12 NOTE — Progress Notes (Signed)
[place] Progress Note Patient Name: Terri Casey DOB: [birth year] MRN: [id number]   Date of Service  09/12/2021  HPI/Events of Note  Notified of abnormal labs - K 3.8, phos 1.8, Mg 1.6, crea 1.05.  Ionized calcium 1.1.4.  eICU Interventions  Replete with NaPhos and MgSO4.      Intervention Category Intermediate Interventions: Electrolyte abnormality - evaluation and management  [name] 09/12/2021, 4:32 AM

## 2021-09-12 NOTE — Progress Notes (Signed)
PHARMACY - PHYSICIAN COMMUNICATION CRITICAL VALUE ALERT - BLOOD CULTURE IDENTIFICATION (BCID)  Terri Casey is an 61 y.o. female who presented to [facility] on 09/11/2021 with a chief complaint of aspiration pneumonia   Assessment:  Staphylococcus species w/ resistance   Name of physician (or Provider) Contacted: Yap  Current antibiotics: vancomycin and zosyn  Changes to prescribed antibiotics recommended:  Patient is on recommended antibiotics - No changes needed  Results for orders placed or performed during the hospital encounter of 09/11/21  Blood Culture ID Panel (Reflexed) (Collected: 09/11/2021  5:38 AM)  Result Value Ref Range   Enterococcus faecalis NOT DETECTED NOT DETECTED   Enterococcus Faecium NOT DETECTED NOT DETECTED   Listeria monocytogenes NOT DETECTED NOT DETECTED   Staphylococcus species DETECTED (A) NOT DETECTED   Staphylococcus aureus (BCID) NOT DETECTED NOT DETECTED   Staphylococcus epidermidis DETECTED (A) NOT DETECTED   Staphylococcus lugdunensis NOT DETECTED NOT DETECTED   Streptococcus species NOT DETECTED NOT DETECTED   Streptococcus agalactiae NOT DETECTED NOT DETECTED   Streptococcus pneumoniae NOT DETECTED NOT DETECTED   Streptococcus pyogenes NOT DETECTED NOT DETECTED   A.calcoaceticus-baumannii NOT DETECTED NOT DETECTED   Bacteroides fragilis NOT DETECTED NOT DETECTED   Enterobacterales NOT DETECTED NOT DETECTED   Enterobacter cloacae complex NOT DETECTED NOT DETECTED   Escherichia coli NOT DETECTED NOT DETECTED   Klebsiella aerogenes NOT DETECTED NOT DETECTED   Klebsiella oxytoca NOT DETECTED NOT DETECTED   Klebsiella pneumoniae NOT DETECTED NOT DETECTED   Proteus species NOT DETECTED NOT DETECTED   Salmonella species NOT DETECTED NOT DETECTED   Serratia marcescens NOT DETECTED NOT DETECTED   Haemophilus influenzae NOT DETECTED NOT DETECTED   Neisseria meningitidis NOT DETECTED NOT DETECTED   Pseudomonas aeruginosa NOT DETECTED NOT  DETECTED   Stenotrophomonas maltophilia NOT DETECTED NOT DETECTED   Candida albicans NOT DETECTED NOT DETECTED   Candida auris NOT DETECTED NOT DETECTED   Candida glabrata NOT DETECTED NOT DETECTED   Candida krusei NOT DETECTED NOT DETECTED   Candida parapsilosis NOT DETECTED NOT DETECTED   Candida tropicalis NOT DETECTED NOT DETECTED   Cryptococcus neoformans/gattii NOT DETECTED NOT DETECTED   Methicillin resistance mecA/C DETECTED (A) NOT DETECTED    Terri Casey 09/12/2021  3:18 AM

## 2021-09-12 NOTE — Progress Notes (Signed)
Initial Nutrition Assessment  DOCUMENTATION CODES:   Obesity unspecified  INTERVENTION:   Initiate tube feeding via OG tube: Vital AF 1.2 at 20 ml/h and increase by 10 ml every 8 hours to goal rate of 50 ml/hr  (1200 ml per day) Prosource TF20 60 ml TID   Provides 1680 kcal, 150 gm protein, 973 ml free water daily   NUTRITION DIAGNOSIS:   Inadequate oral intake related to inability to eat as evidenced by NPO status.  GOAL:   Patient will meet greater than or equal to 90% of their needs  MONITOR:   TF tolerance  REASON FOR ASSESSMENT:   Consult Assessment of nutrition requirement/status  ASSESSMENT:   Pt with PMH of COPD, current active smoker, anxiety, asthma, CHF, DM, HTN, OSA, PAF, and hypothyroidism admitted for sepsis due to aspiration.   Spoke with RN at bedside.   9/11 admit for aspiration PNA, intubated 9/12 OK with MD to start TF  Medications reviewed and include: folic acid, SSI, synthroid, protonix  LR @ 100 ml/hr Levophed @ 4 mcg  Na phos x 1  Propofol @ 15 ml/hr provides: 396 kcal   Labs reviewed: PO4 1.8, magnesium 1.6  16 F OG tube; per xray side port and tip in stomach   NUTRITION - FOCUSED PHYSICAL EXAM:  Flowsheet Row Most Recent Value  Orbital Region No depletion  Upper Arm Region No depletion  Thoracic and Lumbar Region No depletion  Buccal Region No depletion  Temple Region Mild depletion  Clavicle Bone Region No depletion  Clavicle and Acromion Bone Region Mild depletion  Scapular Bone Region No depletion  Dorsal Hand No depletion  Patellar Region No depletion  Anterior Thigh Region No depletion  Posterior Calf Region No depletion  Edema (RD Assessment) None  Hair Reviewed  Eyes Unable to assess  Mouth Unable to assess  Skin Reviewed  Nails Reviewed       Diet Order:   Diet Order             Diet NPO time specified  Diet effective now                   EDUCATION NEEDS:   Not appropriate for education at  this time  Skin:  Skin Assessment: Reviewed RN Assessment  Last BM:  unknown  Height:   Ht Readings from Last 1 Encounters:  09/11/21 '5\' 9"'$  (1.753 m)    Weight:   Wt Readings from Last 1 Encounters:  09/12/21 112.5 kg    BMI:  Body mass index is 36.63 kg/m.  Estimated Nutritional Needs:   Kcal:  1500-1700  Protein:  130 grams  Fluid:  >1.5 L/day  Lockie Pares., RD, LDN, CNSC See AMiON for contact information

## 2021-09-12 NOTE — Progress Notes (Signed)
NAME:  Terri Casey, MRN:  [id number], DOB:  1960-10-09, LOS: 1 ADMISSION DATE:  09/11/2021, CONSULTATION DATE:  09/11/21 REFERRING MD:  Heath Lark, CHIEF COMPLAINT:  AHRF   History of Present Illness:  61 year old female active smoker with COPD who presented to ED with unresponsiveness and agonal breathing. Found on floor tipped over in wheelchair in respiratory distress. EMS transported via BVM. Failed BiPAP and then intubated in the ED. PCCM consulted for concerned for aspiration pneumonia.  Pertinent  Medical History  Anxiety Asthma dCHF COPD - no pfts on file  Diabetes mellitus without complication Hypertension not on acei Hypothyroidism PAF OSA not on CPAP Significant Hospital Events: Including procedures, antibiotic start and stop dates in addition to other pertinent events   9/11 Transferred from [facility] to [facility]  Interim History / Subjective:  Transferred to ICU last night. On vent and pressors  Objective   Blood pressure 137/76, pulse 76, temperature 99 F (37.2 C), temperature source Bladder, resp. rate 20, height '5\' 9"'$  (1.753 m), weight 112.5 kg, SpO2 98 %. CVP:  [1 mmHg-4 mmHg] 1 mmHg  Vent Mode: PRVC FiO2 (%):  [60 %] 60 % Set Rate:  [12 bmp-20 bmp] 12 bmp Vt Set:  [530 mL] 530 mL PEEP:  [5 cmH20-8 cmH20] 8 cmH20 Plateau Pressure:  [8 cmH20-23 cmH20] 19 cmH20   Intake/Output Summary (Last 24 hours) at 09/12/2021 0814 Last data filed at 09/12/2021 0800 Gross per 24 hour  Intake 5049.43 ml  Output 825 ml  Net 4224.43 ml   Filed Weights   09/11/21 2200 09/12/21 0300  Weight: 112.5 kg 112.5 kg    Physical Exam: General: Chronically ill-appearing, no acute distress HENT: [place], AT, ETT in place Eyes: EOMI, no scleral icterus Respiratory: Diminished breath sounds bilaterally.  No crackles, wheezing or rales Cardiovascular: RRR, -M/R/G, no JVD GI: BS+, soft, nontender Extremities:-Edema,-tenderness Neuro: Sedated, moves extremities x 4, PERRL Skin:  Bruising on feed GU: Foley in place   Resolved Hospital Problem list     Assessment & Plan:   Acute encephalopathy secondary to sepsis CT NAICA --Supportive care --PAD protocol: Propofol and Fentanyl  Acute hypoxemic respiratory failure 2/2 aspiration Emphysema CT Chest with left mediastinal shift with fluid/debri in left main bronchus --Full vent support. Vent weaning protocol --CXR reviewed with improved atelectasis --Scheduled duonebs today followed by PRN --Continue abx --VAP  Septic shock secondary aspiration, possible bacteremia LA cleared 1 of 2 Bcx methicillin resistant staph epidermidis --Continue Vanc and Zosyn. De-escalate pending culture data --LR bolus --Wean levophed for MAP >65 --Obtain trach aspirate  Atrial fibrillation - currently in NSR Chronic diastolic heart failure --Start heparin --Hold BB in setting of hypotension  AKI 2/2 ATN --Monitor UOP/Cr --Avoid nephrotoxic agents --Foley in place  Fracture of 4th and 5th metatarsal  Hx fall in August --Followed by Ortho outpatient. Scheduled for 9/19. Consider consult if still inpatient --NWB on forefoot  Hypothyroidism --Synthroid  ?Hx migraines --Hold imitrex and topamax --Discuss with family indication for meds  Chronic pain --Hold home pain meds and flexeril  Depression --Resume home lexapro  Best Practice (right click and "Reselect all SmartList Selections" daily)   Diet/type: tubefeeds DVT prophylaxis: systemic heparin GI prophylaxis: PPI Lines: Central line Foley:  Yes, and it is still needed Code Status:  full code Last date of multidisciplinary goals of care discussion '[]'$  Will update family  Labs   CBC: Recent Labs  Lab 09/11/21 0446 09/11/21 2039 09/11/21 2256 09/12/21 0308  09/12/21 0423  WBC 14.8*  --   --  14.0*  --   HGB 13.0 12.9 12.2 11.2* 11.9*  HCT 47.6* 38.0 36.0 37.1 35.0*  MCV 115.8*  --   --  104.8*  --   PLT 174  --   --  154  --     Basic  Metabolic Panel: Recent Labs  Lab 09/11/21 0446 09/11/21 2039 09/11/21 2256 09/12/21 0308 09/12/21 0423  NA 142 136 135 137 135  K 4.5 3.8 3.9 3.9 3.8  CL 94*  --   --  95*  --   CO2 35*  --   --  35*  --   GLUCOSE 254*  --   --  96  --   BUN 15  --   --  15  --   CREATININE 1.28*  --   --  1.05*  --   CALCIUM 8.8*  --   --  8.6*  --   MG  --   --   --  1.6*  --   PHOS  --   --   --  1.8*  --    GFR: Estimated Creatinine Clearance: 74.3 mL/min (A) (by C-G formula based on SCr of 1.05 mg/dL (H)). Recent Labs  Lab 09/11/21 0446 09/11/21 0645 09/11/21 1241 09/12/21 0308  PROCALCITON  --   --  2.37  --   WBC 14.8*  --   --  14.0*  LATICACIDVEN 6.0* 4.8* 1.8  --     Liver Function Tests: Recent Labs  Lab 09/11/21 0446  AST 22  ALT 11  ALKPHOS 131*  BILITOT 0.9  PROT 6.8  ALBUMIN 3.2*   No results for input(s): "LIPASE", "AMYLASE" in the last 168 hours. No results for input(s): "AMMONIA" in the last 168 hours.  ABG    Component Value Date/Time   PHART 7.445 09/12/2021 0423   PCO2ART 54.8 (H) 09/12/2021 0423   PO2ART 90 09/12/2021 0423   HCO3 37.5 (H) 09/12/2021 0423   TCO2 39 (H) 09/12/2021 0423   ACIDBASEDEF 7.7 (H) 08/09/2019 0748   O2SAT 97 09/12/2021 0423     Coagulation Profile: Recent Labs  Lab 09/11/21 0446  INR 1.7*    Cardiac Enzymes: No results for input(s): "CKTOTAL", "CKMB", "CKMBINDEX", "TROPONINI" in the last 168 hours.  HbA1C: Hgb A1c MFr Bld  Date/Time Value Ref Range Status  09/11/2021 05:38 PM 4.9 4.8 - 5.6 % Final    Comment:    (NOTE) Pre diabetes:          5.7%-6.4%  Diabetes:              >6.4%  Glycemic control for   <7.0% adults with diabetes   09/11/2021 04:46 AM 4.9 4.8 - 5.6 % Final    Comment:    (NOTE) Pre diabetes:          5.7%-6.4%  Diabetes:              >6.4%  Glycemic control for   <7.0% adults with diabetes     CBG: Recent Labs  Lab 09/11/21 2055 09/11/21 2141 09/11/21 2322 09/12/21 0313  09/12/21 0733  GLUCAP 86 86 93 100* 84    Review of Systems:   Unable to obtain due to critical illness  Past Medical History:  She,  has a past medical history of Anxiety, Asthma, CHF (congestive heart failure) ([place]), COPD (chronic obstructive pulmonary disease) ([place]), Diabetes mellitus without complication ([place]), Hypertension, and Thyroid  disease.   Surgical History:   Past Surgical History:  Procedure Laterality Date   ABDOMINAL HYSTERECTOMY       Social History:   reports that she has been smoking cigarettes. She has a 10.00 pack-year smoking history. She has never used smokeless tobacco. She reports that she does not drink alcohol and does not use drugs.   Family History:  Her family history is not on file.   Allergies Allergies  Allergen Reactions   Estrogens Hives   Mustard Seed Hives   Strawberry Extract Hives     Home Medications  Prior to Admission medications   Medication Sig Start Date End Date Taking? Authorizing Provider  albuterol (PROAIR HFA) 108 (90 Base) MCG/ACT inhaler 2 puffs every 4 hours as needed only  if your can't catch your breath 03/21/21  Yes Johnson, Clanford L, MD  albuterol (PROVENTIL) (2.5 MG/3ML) 0.083% nebulizer solution Take 3 mLs (2.5 mg total) by nebulization every 6 (six) hours as needed for wheezing or shortness of breath. 03/21/21  Yes Johnson, Clanford L, MD  amLODipine (NORVASC) 10 MG tablet Take 10 mg by mouth daily. 02/28/21  Yes [provider]  atorvastatin (LIPITOR) 20 MG tablet Take 20 mg by mouth at bedtime.    Yes [provider]  bethanechol (URECHOLINE) 5 MG tablet Take 5 mg by mouth 2 (two) times daily. 08/06/19  Yes [provider]  cyclobenzaprine (FLEXERIL) 5 MG tablet Take 5 mg by mouth 3 (three) times daily. 02/23/19  Yes [provider]  escitalopram (LEXAPRO) 10 MG tablet Take 1 tablet by mouth daily. 05/26/20  Yes [provider]  folic acid (FOLVITE) 1 MG tablet Take 1 tablet  (1 mg total) by mouth daily. 10/11/19  Yes Tat, Shanon Brow, MD  furosemide (LASIX) 40 MG tablet Take 1 tablet (40 mg total) by mouth daily. 10/11/19  Yes Tat, Shanon Brow, MD  levothyroxine (SYNTHROID) 200 MCG tablet Take 1 tablet (200 mcg total) by mouth daily at 6 (six) AM. 06/20/19  Yes Swayze, Ava, DO  liothyronine (CYTOMEL) 25 MCG tablet Take 25 mcg by mouth daily. 07/31/21  Yes [provider]  loratadine (CLARITIN) 10 MG tablet Take 10 mg by mouth daily.   Yes [provider]  metoprolol tartrate (LOPRESSOR) 25 MG tablet Take 0.5 tablets (12.5 mg total) by mouth 2 (two) times daily. 10/10/19  Yes Tat, Shanon Brow, MD  oxyCODONE-acetaminophen (PERCOCET) 10-325 MG tablet Take 1 tablet by mouth 4 (four) times daily. 09/06/21  Yes [provider]  pantoprazole (PROTONIX) 40 MG tablet Take 1 tablet (40 mg total) by mouth daily. 06/20/19  Yes Swayze, Ava, DO  SUMAtriptan (IMITREX) 50 MG tablet Take 50 mg by mouth every 2 (two) hours as needed for migraine. 07/31/21  Yes [provider]  topiramate (TOPAMAX) 100 MG tablet Take 100 mg by mouth 2 (two) times daily. 06/01/20  Yes [provider]  XARELTO 20 MG TABS tablet Take 20 mg by mouth daily. 05/08/19  Yes [provider]  clobetasol cream (TEMOVATE) 0.05 % Apply topically. Apply as directed. Patient not taking: Reported on 09/11/2021 03/15/20   [provider]  HYDROcodone-acetaminophen (NORCO/VICODIN) 5-325 MG tablet Take 1 tablet by mouth every 6 (six) hours as needed for moderate pain. Patient not taking: Reported on 09/11/2021 09/05/21   Mordecai Rasmussen, MD  predniSONE (DELTASONE) 20 MG tablet Take 3 PO QAM x5days, 2 PO QAM x5days, 1 PO QAM x5days Patient not taking: Reported on 09/11/2021 03/22/21  Murlean Iba, MD     Critical care time: 60 min     The patient is critically ill with multiple organ systems failure and requires high complexity decision making for assessment and support, frequent  evaluation and titration of therapies, application of advanced monitoring technologies and extensive interpretation of multiple databases.  Independent Critical Care Time: 60 Minutes.   Rodman Pickle, M.D. [facility] Pulmonary/Critical Care Medicine 09/12/2021 8:15 AM   Please see Amion for pager number to reach on-call Pulmonary and Critical Care Team.

## 2021-09-13 DIAGNOSIS — A419 Sepsis, unspecified organism: Secondary | ICD-10-CM | POA: Diagnosis not present

## 2021-09-13 DIAGNOSIS — J9601 Acute respiratory failure with hypoxia: Secondary | ICD-10-CM | POA: Diagnosis not present

## 2021-09-13 LAB — BASIC METABOLIC PANEL
Anion gap: 10 (ref 5–15)
Anion gap: 8 (ref 5–15)
BUN: 20 mg/dL (ref 8–23)
BUN: 16 mg/dL (ref 8–23)
CO2: 31 mmol/L (ref 22–32)
CO2: 33 mmol/L — ABNORMAL HIGH (ref 22–32)
Calcium: 7.6 mg/dL — ABNORMAL LOW (ref 8.9–10.3)
Calcium: 8.1 mg/dL — ABNORMAL LOW (ref 8.9–10.3)
Chloride: 99 mmol/L (ref 98–111)
Chloride: 102 mmol/L (ref 98–111)
Creatinine, Ser: 0.92 mg/dL (ref 0.44–1.00)
Creatinine, Ser: 0.86 mg/dL (ref 0.44–1.00)
GFR, Estimated: >60 mL/min (ref >60)
GFR, Estimated: >60 mL/min (ref >60)
Glucose, Bld: 95 mg/dL (ref 70–99)
Glucose, Bld: 108 mg/dL — ABNORMAL HIGH (ref 70–99)
Potassium: 2.5 mmol/L — CL (ref 3.5–5.1)
Potassium: 3.1 mmol/L — ABNORMAL LOW (ref 3.5–5.1)
Sodium: 140 mmol/L (ref 135–145)
Sodium: 143 mmol/L (ref 135–145)

## 2021-09-13 LAB — CBC
HCT: 29.5 % — ABNORMAL LOW (ref 36.0–46.0)
Hemoglobin: 9.2 g/dL — ABNORMAL LOW (ref 12.0–15.0)
MCH: 31.7 pg (ref 26.0–34.0)
MCHC: 31.2 g/dL (ref 30.0–36.0)
MCV: 101.7 fL — ABNORMAL HIGH (ref 80.0–100.0)
Platelets: 107 10*3/uL — ABNORMAL LOW (ref 150–400)
RBC: 2.9 MIL/uL — ABNORMAL LOW (ref 3.87–5.11)
RDW: 15.6 % — ABNORMAL HIGH (ref 11.5–15.5)
WBC: 7.9 10*3/uL (ref 4.0–10.5)
nRBC: 0 % (ref 0.0–0.2)

## 2021-09-13 LAB — MAGNESIUM: Magnesium: 1.9 mg/dL (ref 1.7–2.4)

## 2021-09-13 LAB — TRIGLYCERIDES: Triglycerides: 189 mg/dL — ABNORMAL HIGH (ref <150)

## 2021-09-13 LAB — LEGIONELLA PNEUMOPHILA SEROGP 1 UR AG: L. pneumophila Serogp 1 Ur Ag: NEGATIVE

## 2021-09-13 LAB — GLUCOSE, CAPILLARY
Glucose-Capillary: 79 mg/dL (ref 70–99)
Glucose-Capillary: 76 mg/dL (ref 70–99)
Glucose-Capillary: 97 mg/dL (ref 70–99)
Glucose-Capillary: 91 mg/dL (ref 70–99)
Glucose-Capillary: 94 mg/dL (ref 70–99)

## 2021-09-13 LAB — HEPARIN LEVEL (UNFRACTIONATED): Heparin Unfractionated: 0.31 IU/mL (ref 0.30–0.70)

## 2021-09-13 LAB — PHOSPHORUS: Phosphorus: 3 mg/dL (ref 2.5–4.6)

## 2021-09-13 MED ORDER — POTASSIUM CHLORIDE 10 MEQ/50ML IV SOLN
10.0000 meq | INTRAVENOUS | Status: AC
  Administered 2021-09-13 (×3): 10 meq via INTRAVENOUS

## 2021-09-13 MED ORDER — ORAL CARE MOUTH RINSE: 15.0000 mL | OROMUCOSAL | Status: DC | PRN

## 2021-09-13 MED ORDER — BETHANECHOL CHLORIDE 5 MG PO TABS
5.0000 mg | ORAL_TABLET | Freq: Two times a day (BID) | ORAL | Status: DC
  Administered 2021-09-14 – 2021-09-25 (×23): 5 mg via ORAL
  Filled 2021-09-13 (×24): qty 1

## 2021-09-13 MED ORDER — POTASSIUM CHLORIDE 20 MEQ PO PACK
40.0000 meq | PACK | Freq: Once | ORAL | Status: AC
  Administered 2021-09-13: 40 meq
  Filled 2021-09-13: qty 2

## 2021-09-13 MED ORDER — POTASSIUM CHLORIDE 10 MEQ/50ML IV SOLN
10.0000 meq | INTRAVENOUS | Status: AC
  Administered 2021-09-13 (×6): 10 meq via INTRAVENOUS
  Filled 2021-09-13 (×6): qty 50

## 2021-09-13 MED ORDER — IPRATROPIUM-ALBUTEROL 0.5-2.5 (3) MG/3ML IN SOLN: 3.0000 mL | Freq: Four times a day (QID) | RESPIRATORY_TRACT | Status: DC | PRN

## 2021-09-13 MED ORDER — RIVAROXABAN 20 MG PO TABS
20.0000 mg | ORAL_TABLET | Freq: Every day | ORAL | Status: DC
  Administered 2021-09-13 – 2021-09-15 (×3): 20 mg via ORAL
  Filled 2021-09-13 (×3): qty 1

## 2021-09-13 MED ORDER — ESCITALOPRAM OXALATE 10 MG PO TABS
10.0000 mg | ORAL_TABLET | Freq: Every day | ORAL | Status: DC
  Administered 2021-09-14 – 2021-09-25 (×12): 10 mg via ORAL
  Filled 2021-09-13 (×12): qty 1

## 2021-09-13 MED ORDER — POTASSIUM CHLORIDE 10 MEQ/50ML IV SOLN
INTRAVENOUS | Status: AC
  Administered 2021-09-13: 10 meq via INTRAVENOUS
  Filled 2021-09-13: qty 200

## 2021-09-13 MED ORDER — UMECLIDINIUM-VILANTEROL 62.5-25 MCG/ACT IN AEPB
1.0000 | INHALATION_SPRAY | Freq: Every day | RESPIRATORY_TRACT | Status: DC
  Administered 2021-09-14 – 2021-09-16 (×3): 1 via RESPIRATORY_TRACT
  Filled 2021-09-13: qty 14

## 2021-09-13 MED ORDER — FOLIC ACID 1 MG PO TABS
1.0000 mg | ORAL_TABLET | Freq: Every day | ORAL | Status: DC
  Administered 2021-09-14 – 2021-09-25 (×12): 1 mg via ORAL
  Filled 2021-09-13 (×12): qty 1

## 2021-09-13 MED ORDER — DM-GUAIFENESIN ER 30-600 MG PO TB12: 1.0000 | ORAL_TABLET | Freq: Two times a day (BID) | ORAL | Status: DC | PRN

## 2021-09-13 MED ORDER — RISPERIDONE 0.25 MG PO TABS
0.2500 mg | ORAL_TABLET | Freq: Every day | ORAL | Status: DC
  Administered 2021-09-14 – 2021-09-24 (×11): 0.25 mg via ORAL
  Filled 2021-09-13 (×12): qty 1

## 2021-09-13 MED ORDER — UMECLIDINIUM-VILANTEROL 62.5-25 MCG/ACT IN AEPB
1.0000 | INHALATION_SPRAY | Freq: Every day | RESPIRATORY_TRACT | Status: DC
  Filled 2021-09-13: qty 14

## 2021-09-13 MED ORDER — SODIUM CHLORIDE 0.9 % IV SOLN
2.0000 g | INTRAVENOUS | Status: DC
  Administered 2021-09-13 – 2021-09-21 (×9): 2 g via INTRAVENOUS
  Filled 2021-09-13 (×9): qty 20

## 2021-09-13 MED ORDER — MAGNESIUM SULFATE 2 GM/50ML IV SOLN
2.0000 g | Freq: Once | INTRAVENOUS | Status: AC
  Administered 2021-09-13: 2 g via INTRAVENOUS
  Filled 2021-09-13: qty 50

## 2021-09-13 MED ORDER — POTASSIUM CHLORIDE 10 MEQ/100ML IV SOLN
10.0000 meq | INTRAVENOUS | Status: DC
  Filled 2021-09-13: qty 100

## 2021-09-13 MED ORDER — LEVOTHYROXINE SODIUM 100 MCG PO TABS
200.0000 ug | ORAL_TABLET | Freq: Every day | ORAL | Status: DC
  Administered 2021-09-14 – 2021-09-25 (×12): 200 ug via ORAL
  Filled 2021-09-13 (×12): qty 2

## 2021-09-13 MED ORDER — OXYCODONE-ACETAMINOPHEN 5-325 MG PO TABS
1.0000 | ORAL_TABLET | Freq: Four times a day (QID) | ORAL | Status: DC | PRN
  Administered 2021-09-13 – 2021-09-23 (×9): 1 via ORAL
  Filled 2021-09-13 (×9): qty 1

## 2021-09-13 MED ORDER — ATORVASTATIN CALCIUM 10 MG PO TABS
20.0000 mg | ORAL_TABLET | Freq: Every day | ORAL | Status: DC
  Administered 2021-09-14 – 2021-09-24 (×11): 20 mg via ORAL
  Filled 2021-09-13 (×12): qty 2

## 2021-09-13 MED ORDER — RIVAROXABAN 20 MG PO TABS: 20.0000 mg | ORAL_TABLET | Freq: Every day | ORAL | Status: DC

## 2021-09-13 MED ORDER — PANTOPRAZOLE SODIUM 40 MG PO TBEC
40.0000 mg | DELAYED_RELEASE_TABLET | Freq: Every day | ORAL | Status: DC
  Administered 2021-09-14 – 2021-09-25 (×12): 40 mg via ORAL
  Filled 2021-09-13 (×12): qty 1

## 2021-09-13 NOTE — Progress Notes (Signed)
Pt states she don't want to wear CPAP.

## 2021-09-13 NOTE — Progress Notes (Signed)
VAST RN assessed bilateral arms utilizing ultrasound to attempt to place IV so that CL can be dc'd. Few vessels visualized were too small or too deep for USGIV placement. Unit RN notified.

## 2021-09-13 NOTE — Progress Notes (Signed)
NAME:  Terri Casey, MRN:  [id number], DOB:  Aug 13, 1960, LOS: 2 ADMISSION DATE:  09/11/2021, CONSULTATION DATE:  09/11/21 REFERRING MD:  Heath Lark, CHIEF COMPLAINT:  AHRF   History of Present Illness:  61 year old female active smoker with COPD who presented to ED with unresponsiveness and agonal breathing. Found on floor tipped over in wheelchair in respiratory distress. EMS transported via BVM. Failed BiPAP and then intubated in the ED. PCCM consulted for concerned for aspiration pneumonia.  Pertinent  Medical History  Anxiety Asthma dCHF COPD - no pfts on file  Diabetes mellitus without complication Hypertension not on acei Hypothyroidism PAF OSA not on CPAP Significant Hospital Events: Including procedures, antibiotic start and stop dates in addition to other pertinent events   9/11 [facility] from [facility] to [facility]  Interim History / Subjective:  Off pressors at 1231 yesterday 9/12, ventilated but FiO2 weaned to 50%, she is awake and responsive to voice and touch and follows commands  Objective   Blood pressure 101/60, pulse 71, temperature 97.7 F (36.5 C), temperature source Oral, resp. rate 18, height '5\' 9"'$  (1.753 m), weight 114.6 kg, SpO2 97 %. CVP:  [9 mmHg-10 mmHg] 10 mmHg  Vent Mode: PRVC FiO2 (%):  [40 %-60 %] 50 % Set Rate:  [12 bmp-18 bmp] 18 bmp Vt Set:  [530 mL] 530 mL PEEP:  [5 cmH20-8 cmH20] 5 cmH20 Plateau Pressure:  [12 cmH20-19 cmH20] 18 cmH20   Intake/Output Summary (Last 24 hours) at 09/13/2021 0647 Last data filed at 09/13/2021 0600 Gross per 24 hour  Intake 4256.08 ml  Output 1745 ml  Net 2511.08 ml    Filed Weights   09/11/21 2200 09/12/21 0300 09/13/21 0500  Weight: 112.5 kg 112.5 kg 114.6 kg    Physical Exam: General: Awake, NAD, responsive to voice and follows commands HENT: NCAT, ET tube in place Eyes: EOMI, no scleral icterus, pupils equal Respiratory: Distant lung sounds, no wheezes rales or crackles, no  retractions Cardiovascular: regular rate, no murmurs rubs or gallops GI: Soft, nontender to palpation, nondistended, bowel sounds normoactive Extremities: No lower extremity edema, medial aspects of feet with bruising present Neuro: Able to follow commands with voice GU: Deferred  Potassium 2.5 Creatinine 0.92 from 1.05 WBC 7.9 Hemoglobin 9.2 from 12.2 MCV 101.7 Platelets 107 from 154 TG 189 from [facility] Problem list     Assessment & Plan:   Acute encephalopathy secondary to sepsis CT NAICA --Supportive care --PAD protocol: wean Propofol infusion and Fentanyl prn  Acute hypoxemic respiratory failure 2/2 aspiration Emphysema CT Chest with left mediastinal shift with fluid/debri in left main bronchus -- Passed SBT, extubate patient --Duonebs --Continue abx --VAP  Septic shock secondary aspiration, possible bacteremia LA cleared 1 of 3 Bcx methicillin resistant staph epidermidis. MRSA negative --Continue Zosyn and vancomycin. De-escalate pending culture data --LR bolus --Levophed weaned off yesterday --Obtain trach aspirate  Atrial fibrillation - currently in NSR Chronic diastolic heart failure --heparin --Hold BB in setting of hypotension  AKI 2/2 ATN Improved 0.92 today --Monitor UOP/Cr --Avoid nephrotoxic agents --Foley in place  Macrocytic Anemia  Hgb 9.2 from 12.2.  Overall looks dilutional this morning. -Consider B12  Fracture of 4th and 5th metatarsal  Hx fall in August --Followed by Ortho outpatient. Scheduled for 9/19. Consider consult if still inpatient --NWB on forefoot  Hypothyroidism --Synthroid  Hypokalemia -Replete  ?Hx migraines Family confirmed these medications are for migraines not seizures --Hold imitrex and topamax  Chronic  pain --Hold home pain meds and flexeril  Depression --Resume home lexapro  Best Practice (right click and "Reselect all SmartList Selections" daily)   Diet/type:  tubefeeds DVT prophylaxis: systemic heparin GI prophylaxis: PPI Lines: Central line Foley:  Yes, and it is still needed Code Status:  full code Last date of multidisciplinary goals of care discussion '[]'$  Will update family  Labs   CBC: Recent Labs  Lab 09/11/21 0446 09/11/21 2039 09/11/21 2256 09/12/21 0308 09/12/21 0423 09/12/21 1634 09/13/21 0353  WBC 14.8*  --   --  14.0*  --   --  7.9  HGB 13.0   < > 12.2 11.2* 11.9* 12.2 9.2*  HCT 47.6*   < > 36.0 37.1 35.0* 36.0 29.5*  MCV 115.8*  --   --  104.8*  --   --  101.7*  PLT 174  --   --  154  --   --  107*   < > = values in this interval not displayed.     Basic Metabolic Panel: Recent Labs  Lab 09/11/21 0446 09/11/21 2039 09/11/21 2256 09/12/21 0308 09/12/21 0423 09/12/21 1441 09/12/21 1634 09/13/21 0353  NA 142   < > 135 137 135  --  137 140  K 4.5   < > 3.9 3.9 3.8  --  3.0* 2.5*  CL 94*  --   --  95*  --   --   --  99  CO2 35*  --   --  35*  --   --   --  31  GLUCOSE 254*  --   --  96  --   --   --  95  BUN 15  --   --  15  --   --   --  20  CREATININE 1.28*  --   --  1.05*  --   --   --  0.92  CALCIUM 8.8*  --   --  8.6*  --   --   --  7.6*  MG  --   --   --  1.6*  --  1.9  --  1.9  PHOS  --   --   --  1.8*  --  3.6  --  3.0   < > = values in this interval not displayed.    GFR: Estimated Creatinine Clearance: 85.7 mL/min (by C-G formula based on SCr of 0.92 mg/dL). Recent Labs  Lab 09/11/21 0446 09/11/21 0645 09/11/21 1241 09/12/21 0308 09/13/21 0353  PROCALCITON  --   --  2.37  --   --   WBC 14.8*  --   --  14.0* 7.9  LATICACIDVEN 6.0* 4.8* 1.8  --   --      Liver Function Tests: Recent Labs  Lab 09/11/21 0446  AST 22  ALT 11  ALKPHOS 131*  BILITOT 0.9  PROT 6.8  ALBUMIN 3.2*    No results for input(s): "LIPASE", "AMYLASE" in the last 168 hours. No results for input(s): "AMMONIA" in the last 168 hours.  ABG    Component Value Date/Time   PHART 7.477 (H) 09/12/2021 1634    PCO2ART 44.1 09/12/2021 1634   PO2ART 56 (L) 09/12/2021 1634   HCO3 32.6 (H) 09/12/2021 1634   TCO2 34 (H) 09/12/2021 1634   ACIDBASEDEF 7.7 (H) 08/09/2019 0748   O2SAT 90 09/12/2021 1634     Coagulation Profile: Recent Labs  Lab 09/11/21 0446  INR 1.7*     Cardiac  Enzymes: No results for input(s): "CKTOTAL", "CKMB", "CKMBINDEX", "TROPONINI" in the last 168 hours.  HbA1C: Hgb A1c MFr Bld  Date/Time Value Ref Range Status  09/11/2021 05:38 PM 4.9 4.8 - 5.6 % Final    Comment:    (NOTE) Pre diabetes:          5.7%-6.4%  Diabetes:              >6.4%  Glycemic control for   <7.0% adults with diabetes   09/11/2021 04:46 AM 4.9 4.8 - 5.6 % Final    Comment:    (NOTE) Pre diabetes:          5.7%-6.4%  Diabetes:              >6.4%  Glycemic control for   <7.0% adults with diabetes     CBG: Recent Labs  Lab 09/12/21 1108 09/12/21 1523 09/12/21 1922 09/12/21 2335 09/13/21 0313  GLUCAP 98 72 70 74 79     Review of Systems:   Unable to obtain due to critical illness  Past Medical History:  She,  has a past medical history of Anxiety, Asthma, CHF (congestive heart failure) ([place]), COPD (chronic obstructive pulmonary disease) ([place]), Diabetes mellitus without complication ([place]), Hypertension, and Thyroid disease.   Surgical History:   Past Surgical History:  Procedure Laterality Date   ABDOMINAL HYSTERECTOMY       Social History:   reports that she has been smoking cigarettes. She has a 10.00 pack-year smoking history. She has never used smokeless tobacco. She reports that she does not drink alcohol and does not use drugs.   Family History:  Her family history is not on file.   Allergies Allergies  Allergen Reactions   Estrogens Hives   Mustard Seed Hives   Strawberry Extract Hives     Home Medications  Prior to Admission medications   Medication Sig Start Date End Date Taking? Authorizing Provider  albuterol (PROAIR HFA) 108 (90 Base) MCG/ACT  inhaler 2 puffs every 4 hours as needed only  if your can't catch your breath 03/21/21  Yes Johnson, Clanford L, MD  albuterol (PROVENTIL) (2.5 MG/3ML) 0.083% nebulizer solution Take 3 mLs (2.5 mg total) by nebulization every 6 (six) hours as needed for wheezing or shortness of breath. 03/21/21  Yes Johnson, Clanford L, MD  amLODipine (NORVASC) 10 MG tablet Take 10 mg by mouth daily. 02/28/21  Yes [provider]  atorvastatin (LIPITOR) 20 MG tablet Take 20 mg by mouth at bedtime.    Yes [provider]  bethanechol (URECHOLINE) 5 MG tablet Take 5 mg by mouth 2 (two) times daily. 08/06/19  Yes [provider]  cyclobenzaprine (FLEXERIL) 5 MG tablet Take 5 mg by mouth 3 (three) times daily. 02/23/19  Yes [provider]  escitalopram (LEXAPRO) 10 MG tablet Take 1 tablet by mouth daily. 05/26/20  Yes [provider]  folic acid (FOLVITE) 1 MG tablet Take 1 tablet (1 mg total) by mouth daily. 10/11/19  Yes Tat, Shanon Brow, MD  furosemide (LASIX) 40 MG tablet Take 1 tablet (40 mg total) by mouth daily. 10/11/19  Yes Tat, Shanon Brow, MD  levothyroxine (SYNTHROID) 200 MCG tablet Take 1 tablet (200 mcg total) by mouth daily at 6 (six) AM. 06/20/19  Yes Swayze, Ava, DO  liothyronine (CYTOMEL) 25 MCG tablet Take 25 mcg by mouth daily. 07/31/21  Yes [provider]  loratadine (CLARITIN) 10 MG tablet Take 10 mg by mouth daily.   Yes [provider]  metoprolol tartrate (LOPRESSOR) 25 MG tablet Take 0.5 tablets (12.5 mg total) by mouth 2 (two) times daily. 10/10/19  Yes Tat, Shanon Brow, MD  oxyCODONE-acetaminophen (PERCOCET) 10-325 MG tablet Take 1 tablet by mouth 4 (four) times daily. 09/06/21  Yes [provider]  pantoprazole (PROTONIX) 40 MG tablet Take 1 tablet (40 mg total) by mouth daily. 06/20/19  Yes Swayze, Ava, DO  SUMAtriptan (IMITREX) 50 MG tablet Take 50 mg by mouth every 2 (two) hours as needed for migraine. 07/31/21  Yes [provider]   topiramate (TOPAMAX) 100 MG tablet Take 100 mg by mouth 2 (two) times daily. 06/01/20  Yes [provider]  XARELTO 20 MG TABS tablet Take 20 mg by mouth daily. 05/08/19  Yes [provider]  clobetasol cream (TEMOVATE) 0.05 % Apply topically. Apply as directed. Patient not taking: Reported on 09/11/2021 03/15/20   [provider]  HYDROcodone-acetaminophen (NORCO/VICODIN) 5-325 MG tablet Take 1 tablet by mouth every 6 (six) hours as needed for moderate pain. Patient not taking: Reported on 09/11/2021 09/05/21   Mordecai Rasmussen, MD  predniSONE (DELTASONE) 20 MG tablet Take 3 PO QAM x5days, 2 PO QAM x5days, 1 PO QAM x5days Patient not taking: Reported on 09/11/2021 03/22/21   Murlean Iba, MD     Critical care time: 60 min     The patient is critically ill with multiple organ systems failure and requires high complexity decision making for assessment and support, frequent evaluation and titration of therapies, application of advanced monitoring technologies and extensive interpretation of multiple databases.  Independent Critical Care Time: 60 Minutes.   Gerrit Heck, MD FM PGY-2 [facility] 09/13/2021 6:47 AM   Please see Amion for pager number to reach on-call Pulmonary and Critical Care Team.

## 2021-09-13 NOTE — Evaluation (Signed)
Physical Therapy Evaluation Patient Details Name: Terri Casey MRN: [id number] DOB: [birth year] Today's Date: 09/13/2021  History of Present Illness  pt is a 61 y/o female presenting to ED 9/11 with unresponsiveness and agonal breathing.  Found on the floor tipped over in her wheelchair in respiratory distress.  Failed BiPAP then intubated in the ED.  Concern for aspiration PNA.  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    PMH:  CHF, COPD, HTN, DM, ?recent L foot fx/4th, 5th metatarsals?  Clinical Impression  Pt admitted with/for the above described event.  Pt presently not at baseline function and needing min to moderate assist for basic mobility, further limited by L foot fractures..  Pt currently limited functionally due to the problems listed below.  (see problems list.)  Pt will benefit from PT to maximize function and safety to be able to get home safely with available assist.        Recommendations for follow up therapy are one component of a multi-disciplinary discharge planning process, led by the attending physician.  Recommendations may be updated based on patient status, additional functional criteria and insurance authorization.  Follow Up Recommendations Home health PT      Assistance Recommended at Discharge Intermittent Supervision/Assistance  Patient can return home with the following  A little help with walking and/or transfers;A little help with bathing/dressing/bathroom;Assistance with cooking/housework;Direct supervision/assist for medications management;Direct supervision/assist for financial management;Assist for transportation;Help with stairs or ramp for entrance    Equipment Recommendations None recommended by PT  Recommendations for Other Services       Functional Status Assessment Patient has had a recent decline in their functional status and demonstrates the ability to make  significant improvements in function in a reasonable and predictable amount of time.     Precautions / Restrictions Precautions Precautions: Fall Restrictions Weight Bearing Restrictions: Yes LLE Weight Bearing: Non weight bearing (in the forefoot.  Will clarify if can w/bear through heel with boot.)      Mobility  Bed Mobility Overal bed mobility: Needs Assistance Bed Mobility: Supine to Sit     Supine to sit: Min assist     General bed mobility comments: pt moved to EOB mostly under her own power with minimal truncal assist up via L elbow.    Transfers Overall transfer level: Needs assistance Equipment used: None Transfers: Bed to chair/wheelchair/BSC, Sit to/from Stand Sit to Stand: Min assist Stand pivot transfers: Min assist, Mod assist (mod if asked to take pivotal steps bed to bsc, min if pivot only)         General transfer comment: cues for hand placement, assist forward more than boost.    Ambulation/Gait               General Gait Details: NT today, unsure of W/bearing parameters  Stairs            Wheelchair Mobility    Modified Rankin (Stroke Patients Only)       Balance Overall balance assessment: Needs assistance Sitting-balance support: No upper extremity supported, Single extremity supported, Feet supported Sitting balance-Leahy Scale: Fair     Standing balance support: Single extremity supported, Bilateral upper extremity supported, During functional activity Standing balance-Leahy Scale: Poor Standing balance comment: reliant on stationary surfaces or external support  Pertinent Vitals/Pain Pain Assessment Pain Assessment: Faces Faces Pain Scale: Hurts a little bit Pain Location: R>L foot Pain Descriptors / Indicators: Sore Pain Intervention(s): Limited activity within patient's tolerance, Monitored during session    Home Living Family/patient expects to be discharged to:: Private  residence Living Arrangements: Children Available Help at Discharge: Family;Personal care attendant;Available PRN/intermittently Type of Home: House Home Access: Stairs to enter Entrance Stairs-Rails: Right Entrance Stairs-Number of Steps: 3-4   Home Layout: One level Home Equipment: BSC/3in1;Rolling Walker (2 wheels);Toilet riser;Cane - single point;Wheelchair - manual      Prior Function Prior Level of Function : Needs assist       Physical Assist : Mobility (physical);ADLs (physical)     Mobility Comments: Household ambulator with frequent leaning on walls, furniture, uses RW for longer distances ADLs Comments: home aides from 8am to 3pm x 5 days/week, assisted by family     Hand Dominance   Dominant Hand: Right    Extremity/Trunk Assessment   Upper Extremity Assessment Upper Extremity Assessment: Generalized weakness    Lower Extremity Assessment Lower Extremity Assessment: Generalized weakness    Cervical / Trunk Assessment Cervical / Trunk Assessment: Kyphotic  Communication   Communication: No difficulties  Cognition Arousal/Alertness: Awake/alert Behavior During Therapy: WFL for tasks assessed/performed Overall Cognitive Status: No family/caregiver present to determine baseline cognitive functioning                                          General Comments      Exercises     Assessment/Plan    PT Assessment Patient needs continued PT services  PT Problem List Decreased strength;Decreased activity tolerance;Decreased balance;Decreased mobility;Decreased knowledge of use of DME;Cardiopulmonary status limiting activity;Pain       PT Treatment Interventions DME instruction;Gait training;Stair training;Functional mobility training;Therapeutic activities;Balance training;Patient/family education    PT Goals (Current goals can be found in the Care Plan section)  Acute Rehab PT Goals PT Goal Formulation: Patient unable to participate in  goal setting Time For Goal Achievement: 09/27/21 Potential to Achieve Goals: Good    Frequency Min 3X/week     Co-evaluation               AM-PAC PT "6 Clicks" Mobility  Outcome Measure Help needed turning from your back to your side while in a flat bed without using bedrails?: A Little Help needed moving from lying on your back to sitting on the side of a flat bed without using bedrails?: A Little Help needed moving to and from a bed to a chair (including a wheelchair)?: A Lot Help needed standing up from a chair using your arms (e.g., wheelchair or bedside chair)?: A Little Help needed to walk in hospital room?: A Lot Help needed climbing 3-5 steps with a railing? : A Lot 6 Click Score: 15    End of Session   Activity Tolerance: Patient tolerated treatment well Patient left: in chair;with call bell/phone within reach;with chair alarm set Nurse Communication: Mobility status PT Visit Diagnosis: Unsteadiness on feet (R26.81);Muscle weakness (generalized) (M62.81)    Time: 3559-7416 PT Time Calculation (min) (ACUTE ONLY): 42 min   Charges:   PT Evaluation $PT Eval Moderate Complexity: 1 Mod PT Treatments $Therapeutic Activity: 23-37 mins        09/13/2021  Ginger Carne., PT Acute Rehabilitation Services [phone]  (pager) [phone]  (office)  Tessie Fass Heraclio Seidman 09/13/2021, 6:11  PM

## 2021-09-13 NOTE — Procedures (Signed)
Extubation Procedure Note  Patient Details:   Name: Terri Casey DOB: Mar 02, 1960 MRN: [id number]   Airway Documentation:    Vent end date: 09/13/21 Vent end time: 0855   Evaluation  O2 sats: stable throughout Complications: No apparent complications Patient did tolerate procedure well. Bilateral Breath Sounds: Diminished   Yes  Pt extubated to [place] with RN at bedside. Positive cuff leak noted and pt is tolerating well. RT will monitor.  Cathie Olden 09/13/2021, 8:56 AM

## 2021-09-13 NOTE — Progress Notes (Signed)
[facility] Progress Note Patient Name: Terri Casey DOB: October 08, 1960 MRN: [id number]   Date of Service  09/13/2021  HPI/Events of Note  K+ 2.5, Cr 0.92, GFR > 60.  eICU Interventions  K+ repleted per E-Link electrolyte replacement protocol.        Kerry Kass Eugene Zeiders 09/13/2021, 4:57 AM

## 2021-09-13 NOTE — Progress Notes (Signed)
Patient arrived to unit, VSS. No complaints from patient, will continue to monitor throughout the shift.

## 2021-09-13 NOTE — Progress Notes (Signed)
[facility] ADULT ICU REPLACEMENT PROTOCOL   The patient does apply for the [facility] Adult ICU Electrolyte Replacment Protocol based on the criteria listed below:   1.Exclusion criteria: TCTS patients, ECMO patients, and Dialysis patients 2. Is GFR >/= 30 ml/min? Yes.    Patient's GFR today is >60 3. Is SCr </= 2? Yes.   Patient's SCr is 0.92 mg/dL 4. Did SCr increase >/= 0.5 in 24 hours? No. 5.Pt's weight >40kg  Yes.   6. Abnormal electrolyte(s): mag 1.9  7. Electrolytes replaced per protocol 8.  Call MD STAT for K+ </= 2.5, Phos </= 1, or Mag </= 1 Physician:  Dr. Simonne Come 2.5, awaiting orders  Terri Casey 09/13/2021 4:51 AM

## 2021-09-13 NOTE — Progress Notes (Signed)
Pt placed on PS/CPAP 8/5 on 40% and is tolerating well. RT will monitor. 

## 2021-09-14 DIAGNOSIS — I5032 Chronic diastolic (congestive) heart failure: Secondary | ICD-10-CM

## 2021-09-14 DIAGNOSIS — I480 Paroxysmal atrial fibrillation: Secondary | ICD-10-CM

## 2021-09-14 DIAGNOSIS — J9601 Acute respiratory failure with hypoxia: Secondary | ICD-10-CM | POA: Diagnosis not present

## 2021-09-14 DIAGNOSIS — I10 Essential (primary) hypertension: Secondary | ICD-10-CM

## 2021-09-14 DIAGNOSIS — A419 Sepsis, unspecified organism: Secondary | ICD-10-CM | POA: Diagnosis not present

## 2021-09-14 LAB — CULTURE, BLOOD (ROUTINE X 2): Special Requests: ADEQUATE

## 2021-09-14 LAB — GLUCOSE, CAPILLARY
Glucose-Capillary: 106 mg/dL — ABNORMAL HIGH (ref 70–99)
Glucose-Capillary: 72 mg/dL (ref 70–99)
Glucose-Capillary: 88 mg/dL (ref 70–99)
Glucose-Capillary: 89 mg/dL (ref 70–99)
Glucose-Capillary: 89 mg/dL (ref 70–99)
Glucose-Capillary: 92 mg/dL (ref 70–99)
Glucose-Capillary: 96 mg/dL (ref 70–99)

## 2021-09-14 LAB — CBC
HCT: 34.9 % — ABNORMAL LOW (ref 36.0–46.0)
Hemoglobin: 10.4 g/dL — ABNORMAL LOW (ref 12.0–15.0)
MCH: 31.5 pg (ref 26.0–34.0)
MCHC: 29.8 g/dL — ABNORMAL LOW (ref 30.0–36.0)
MCV: 105.8 fL — ABNORMAL HIGH (ref 80.0–100.0)
Platelets: 128 10*3/uL — ABNORMAL LOW (ref 150–400)
RBC: 3.3 MIL/uL — ABNORMAL LOW (ref 3.87–5.11)
RDW: 16 % — ABNORMAL HIGH (ref 11.5–15.5)
WBC: 6.2 10*3/uL (ref 4.0–10.5)
nRBC: 0 % (ref 0.0–0.2)

## 2021-09-14 LAB — BASIC METABOLIC PANEL
Anion gap: 9 (ref 5–15)
BUN: 11 mg/dL (ref 8–23)
CO2: 33 mmol/L — ABNORMAL HIGH (ref 22–32)
Calcium: 8.3 mg/dL — ABNORMAL LOW (ref 8.9–10.3)
Chloride: 103 mmol/L (ref 98–111)
Creatinine, Ser: 0.79 mg/dL (ref 0.44–1.00)
GFR, Estimated: >60 mL/min (ref >60)
Glucose, Bld: 95 mg/dL (ref 70–99)
Potassium: 3.9 mmol/L (ref 3.5–5.1)
Sodium: 145 mmol/L (ref 135–145)

## 2021-09-14 LAB — MAGNESIUM: Magnesium: 2.3 mg/dL (ref 1.7–2.4)

## 2021-09-14 LAB — PHOSPHORUS: Phosphorus: 4.2 mg/dL (ref 2.5–4.6)

## 2021-09-14 LAB — VANCOMYCIN, RANDOM: Vancomycin Rm: 17 ug/mL

## 2021-09-14 MED ORDER — VANCOMYCIN HCL 1250 MG/250ML IV SOLN
1250.0000 mg | Freq: Two times a day (BID) | INTRAVENOUS | Status: DC
  Administered 2021-09-14 – 2021-09-16 (×5): 1250 mg via INTRAVENOUS
  Filled 2021-09-14 (×6): qty 250

## 2021-09-14 MED ORDER — ADULT MULTIVITAMIN W/MINERALS CH
1.0000 | ORAL_TABLET | Freq: Every day | ORAL | Status: DC
  Administered 2021-09-14 – 2021-09-25 (×12): 1 via ORAL
  Filled 2021-09-14 (×12): qty 1

## 2021-09-14 MED ORDER — ENSURE ENLIVE PO LIQD
237.0000 mL | Freq: Two times a day (BID) | ORAL | Status: DC
  Administered 2021-09-15 – 2021-09-22 (×7): 237 mL via ORAL

## 2021-09-14 NOTE — Progress Notes (Signed)
Nutrition Follow-up  DOCUMENTATION CODES:   Obesity unspecified  INTERVENTION:  Liberalize diet from a carb modified to a regular diet to provide widest variety of menu options to enhance nutritional adequacy Ensure Enlive po BID, each supplement provides 350 kcal and 20 grams of protein. MVI with minerals daily  NUTRITION DIAGNOSIS:   Inadequate oral intake related to inability to eat as evidenced by NPO status.  Improving-pt now on a diet  GOAL:   Patient will meet greater than or equal to 90% of their needs  Goal not met  MONITOR:   PO intake, Supplement acceptance, Labs, Weight trends  REASON FOR ASSESSMENT:   Consult Assessment of nutrition requirement/status  ASSESSMENT:   Pt with PMH of COPD, current active smoker, anxiety, asthma, CHF, DM, HTN, OSA, PAF, and hypothyroidism admitted for sepsis due to aspiration.  9/11 admit for aspiration PNA, intubated 9/12 OK with MD to start TF 9/13 extubated, TF d/c   Food related allergies: strawberry extract, mustard seed  Pt sleeping at time of visit. She awoke easily to her name but appeared drowsy throughout visit. She has a fair appetite. NT present, reports that she ate most of her breakfast except the eggs this morning. Pt has been receiving Mom's meals TID PTA. She started receiving these about 2 months ago. She also reports that since this time, she has begun loosing weight but does not feel as though she has cut back on amount or types of foods she was previously eating. She occasionally drinks and Ensure/Boost at home. Denies difficulty chewing or swallowing foods.   Meal completion: 9/14: 30% breakfast  Reviewed prior weight history. There is limited documentation within the last year to review. Weight documented on 3/20 was 113 kg. 08/29 her weight was documented to be 102.1 kg. Current weight is noted to be 115.6 kg.   Admit weight: 112.5 kg Current weight: 115.6 kg  Medications: folvite, SSI 0-15 units  q4h, synthroid, protonix, IV abx, NaCl PRN  Labs: HgbA1c 4.9%, CBG's 88-106 x24 hours  Diet Order:   Diet Order             Diet Carb Modified Fluid consistency: Thin; Room service appropriate? Yes  Diet effective now                   EDUCATION NEEDS:   Not appropriate for education at this time  Skin:  Skin Assessment: Reviewed RN Assessment  Last BM:  9/13 (type 7 x3, large- via FMS)  Height:   Ht Readings from Last 1 Encounters:  09/13/21 5' 9"  (1.753 m)    Weight:   Wt Readings from Last 1 Encounters:  09/14/21 115.6 kg    Ideal Body Weight:  65.1 kg  BMI:  Body mass index is 37.64 kg/m.  Estimated Nutritional Needs:   Kcal:  1500-1700  Protein:  85-100g  Fluid:  >1.5 L/day  Clayborne Dana, RDN, LDN Clinical Nutrition

## 2021-09-14 NOTE — Progress Notes (Signed)
Physical Therapy Treatment Patient Details Name: Terri Casey MRN: [id number] DOB: October 06, 1960 Today's Date: 09/14/2021   History of Present Illness pt is a 61 y/o female presenting to ED 9/11 with unresponsiveness and agonal breathing.  Found on the floor tipped over in her wheelchair in respiratory distress.  Failed BiPAP then intubated in the ED.  Concern for aspiration PNA.  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    PMH:  CHF, COPD, HTN, DM,  recent L foot fx/4th, 5th metatarsals (outpt ortho visit 9/5)    PT Comments    Pt more lethargic today. Able to transfer to chair. After treatment complete was able to confirm with outpatient ortho note that pt can weight bear on heel of lt foot and is to keep weight off of lt forefoot. If pt doesn't progress in the next few days may need to consider SNF.   Recommendations for follow up therapy are one component of a multi-disciplinary discharge planning process, led by the attending physician.  Recommendations may be updated based on patient status, additional functional criteria and insurance authorization.  Follow Up Recommendations  Home health PT     Assistance Recommended at Discharge Intermittent Supervision/Assistance  Patient can return home with the following A little help with walking and/or transfers;A little help with bathing/dressing/bathroom;Assistance with cooking/housework;Direct supervision/assist for medications management;Direct supervision/assist for financial management;Assist for transportation;Help with stairs or ramp for entrance   Equipment Recommendations  None recommended by PT    Recommendations for Other Services       Precautions / Restrictions Precautions Precautions: Fall Required Braces or Orthoses: Other Brace Other Brace: CAM boot Lt foot Restrictions Weight Bearing Restrictions: Yes LLE Weight Bearing: Non weight bearing  (in the forefoot. Pt can weight bear as tolerated through the heel per 9/5 outpt orthopedic note.)     Mobility  Bed Mobility Overal bed mobility: Needs Assistance Bed Mobility: Supine to Sit     Supine to sit: Min guard     General bed mobility comments: Assist for safety and lines    Transfers Overall transfer level: Needs assistance Equipment used: Rolling walker (2 wheels) Transfers: Bed to chair/wheelchair/BSC, Sit to/from Stand Sit to Stand: Min assist   Step pivot transfers: +2 physical assistance, Min assist       General transfer comment: Assist for balance and support.    Ambulation/Gait               General Gait Details: Didn't determine until after treatment that pt can weight bear through left heel.   Stairs             Wheelchair Mobility    Modified Rankin (Stroke Patients Only)       Balance Overall balance assessment: Needs assistance Sitting-balance support: No upper extremity supported, Single extremity supported, Feet supported Sitting balance-Leahy Scale: Fair     Standing balance support: Single extremity supported, Bilateral upper extremity supported, During functional activity Standing balance-Leahy Scale: Poor Standing balance comment: walker and min assist for static standing                            Cognition Arousal/Alertness: Lethargic Behavior During Therapy: Flat affect Overall Cognitive Status: No family/caregiver present to determine baseline cognitive functioning  General Comments: Able to arouse pt with verbal stimuli but quickly back to sleep if not stimulated        Exercises      General Comments General comments (skin integrity, edema, etc.): SpO2 83% on 3L with transfer to recliner. Incr to 4L with SpO2 incr to 90%      Pertinent Vitals/Pain Pain Assessment Pain Assessment: Faces Faces Pain Scale: No hurt    Home Living                           Prior Function            PT Goals (current goals can now be found in the care plan section) Progress towards PT goals: Progressing toward goals    Frequency    Min 3X/week      PT Plan Current plan remains appropriate    Co-evaluation              AM-PAC PT "6 Clicks" Mobility   Outcome Measure  Help needed turning from your back to your side while in a flat bed without using bedrails?: A Little Help needed moving from lying on your back to sitting on the side of a flat bed without using bedrails?: A Little Help needed moving to and from a bed to a chair (including a wheelchair)?: A Lot Help needed standing up from a chair using your arms (e.g., wheelchair or bedside chair)?: A Little Help needed to walk in hospital room?: Total Help needed climbing 3-5 steps with a railing? : A Lot 6 Click Score: 14    End of Session Equipment Utilized During Treatment: Gait belt;Oxygen Activity Tolerance: Patient limited by lethargy Patient left: in chair;with call bell/phone within reach;with chair alarm set Nurse Communication: Mobility status PT Visit Diagnosis: Unsteadiness on feet (R26.81);Muscle weakness (generalized) (M62.81)     Time: 9449-6759 PT Time Calculation (min) (ACUTE ONLY): 21 min  Charges:  $Therapeutic Activity: 8-22 mins                     [place] [place] 09/14/2021, 1:07 PM

## 2021-09-14 NOTE — Plan of Care (Signed)
  Problem: Clinical Measurements: Goal: Respiratory complications will improve Outcome: Progressing   Problem: Activity: Goal: Risk for activity intolerance will decrease Outcome: Progressing   

## 2021-09-14 NOTE — Progress Notes (Signed)
PROGRESS NOTE  Terri Casey FBP:[id number] DOB: 12/15/1960 DOA: 09/11/2021 PCP: [name], NP  HPI/Recap of past 31 hours: 61 year old female active smoker with hx of COPD, diastolic HF, diabetes mellitus, hypertension, hypothyroidism, paroxysmal A-fib, OSA, presented to ED with unresponsiveness and agonal breathing. Found on floor tipped over in wheelchair in respiratory distress. Failed BiPAP and then intubated in the ED. PCCM admitted for respiratory failure.  Patient was intubated on 9/11, further stabilized extubated on 9/13.  [facility] assumed care on 9/14.    Today, patient's reports feeling somewhat better, still reports some congestion with productive cough, denies any chest pain, worsening shortness of breath, some mild generalized nonspecific abdominal pain, still with diarrhea, denies any fever/chills.    Assessment/Plan: Principal Problem:   Septic shock (HCC) Active Problems:   Acute hypercapnic respiratory failure (HCC)   Chronic diastolic CHF (congestive heart failure) (HCC)   AF (paroxysmal atrial fibrillation) (HCC)   Essential hypertension, benign   Hyperlipidemia   Hypothyroidism   PNA (pneumonia)   Acute hypoxemic respiratory failure (HCC)   Respiratory failure (HCC)   Acute encephalopathy secondary to sepsis Septic shock secondary aspiration, possible staph epidermidis bacteremia Encephalopathy resolved Currently off Levophed- BP has remained stable 1 of 3 Bcx methicillin resistant staph epidermidis. MRSA negative, repeat blood cultures pending Continue Vanco and ceftriaxone, pending culture report Monitor closely  Acute hypoxemic respiratory failure 2/2 likely aspiration COPD Active smoker CT Chest with left mediastinal shift with fluid/debri in left main bronchus Intubated on 9/11, extubated on 9/13, currently now on about 4 L of oxygen Duonebs, flutter valve, pulm hygiene Continue abx as above Swallow eval Advised to quit  smoking Supplemental O2 as needed plans to wean off once able  Diarrhea Continue rectal tube for now Monitor closely  Paroxysmal atrial fibrillation Currently in NSR Continue Xarelto, hold BP due to soft BP Telemetry  Chronic diastolic heart failure Hypertension Appears euvolemic Hold home Lasix for now, hold amlodipine,  Macrocytic Anemia  Hemoglobin currently down to 10.4 Anemia panel pending Daily CBC   Fracture of 4th and 5th metatarsal  Hx fall in August Followed by Ortho outpatient. Scheduled for 9/19 Plan for consult if still inpatient NWB on forefoot   Hypothyroidism Synthroid   ?Hx migraines Family confirmed these medications are for migraines Hold imitrex and topamax for now   Chronic pain Resume home Percocet, hold flexeril   Depression Resume home lexapro  Obesity Lifestyle modification advised       Estimated body mass index is 37.64 kg/m as calculated from the following:   Height as of this encounter: '5\' 9"'$  (1.753 m).   Weight as of this encounter: 115.6 kg.     Code Status: Full  Family Communication: None at bedside  Disposition Plan: Status is: Inpatient Remains inpatient appropriate because: Level of care      Consultants: PCCM  Procedures: Mechanical ventilation  Antimicrobials: Vancomycin, ceftriaxone  DVT prophylaxis: Xarelto   Objective: Vitals:   09/14/21 0330 09/14/21 0512 09/14/21 0913 09/14/21 0917  BP: 122/77  (!) 115/58   Pulse: 85  87   Resp: 18  17   Temp: (!) 97.4 F (36.3 C)  (!) 97.4 F (36.3 C)   TempSrc: Oral  Axillary   SpO2: 96% 91% (!) 89% 93%  Weight: 115.6 kg     Height:        Intake/Output Summary (Last 24 hours) at 09/14/2021 1343 Last data filed at 09/14/2021 0900 Gross per 24  hour  Intake 539.86 ml  Output 625 ml  Net -85.14 ml   Filed Weights   09/13/21 0500 09/13/21 2214 09/14/21 0330  Weight: 114.6 kg 115.7 kg 115.6 kg    Exam: General: NAD, chronically  ill-appearing, appears older than stated age Cardiovascular: S1, S2 present Respiratory: Coarse breath sounds bilaterally Abdomen: Soft, nontender, nondistended, bowel sounds present Musculoskeletal: No bilateral pedal edema noted Skin: Normal Psychiatry: Normal mood     Data Reviewed: CBC: Recent Labs  Lab 09/11/21 0446 09/11/21 2039 09/12/21 0308 09/12/21 0423 09/12/21 1634 09/13/21 0353 09/14/21 0400  WBC 14.8*  --  14.0*  --   --  7.9 6.2  HGB 13.0   < > 11.2* 11.9* 12.2 9.2* 10.4*  HCT 47.6*   < > 37.1 35.0* 36.0 29.5* 34.9*  MCV 115.8*  --  104.8*  --   --  101.7* 105.8*  PLT 174  --  154  --   --  107* 128*   < > = values in this interval not displayed.   Basic Metabolic Panel: Recent Labs  Lab 09/11/21 0446 09/11/21 2039 09/12/21 0308 09/12/21 0423 09/12/21 1441 09/12/21 1634 09/13/21 0353 09/13/21 1212 09/14/21 0400  NA 142   < > 137 135  --  137 140 143 145  K 4.5   < > 3.9 3.8  --  3.0* 2.5* 3.1* 3.9  CL 94*  --  95*  --   --   --  99 102 103  CO2 35*  --  35*  --   --   --  31 33* 33*  GLUCOSE 254*  --  96  --   --   --  95 108* 95  BUN 15  --  15  --   --   --  '20 16 11  '$ CREATININE 1.28*  --  1.05*  --   --   --  0.92 0.86 0.79  CALCIUM 8.8*  --  8.6*  --   --   --  7.6* 8.1* 8.3*  MG  --   --  1.6*  --  1.9  --  1.9  --  2.3  PHOS  --   --  1.8*  --  3.6  --  3.0  --  4.2   < > = values in this interval not displayed.   GFR: Estimated Creatinine Clearance: 99 mL/min (by C-G formula based on SCr of 0.79 mg/dL). Liver Function Tests: Recent Labs  Lab 09/11/21 0446  AST 22  ALT 11  ALKPHOS 131*  BILITOT 0.9  PROT 6.8  ALBUMIN 3.2*   No results for input(s): "LIPASE", "AMYLASE" in the last 168 hours. No results for input(s): "AMMONIA" in the last 168 hours. Coagulation Profile: Recent Labs  Lab 09/11/21 0446  INR 1.7*   Cardiac Enzymes: No results for input(s): "CKTOTAL", "CKMB", "CKMBINDEX", "TROPONINI" in the last 168 hours. BNP  (last 3 results) No results for input(s): "PROBNP" in the last 8760 hours. HbA1C: Recent Labs    09/11/21 1738  HGBA1C 4.9   CBG: Recent Labs  Lab 09/13/21 2003 09/14/21 0011 09/14/21 0507 09/14/21 0900 09/14/21 1152  GLUCAP 94 92 88 106* 89   Lipid Profile: Recent Labs    09/13/21 0353  TRIG 189*   Thyroid Function Tests: Recent Labs    09/11/21 1730  FREET4 <0.25*   Anemia Panel: No results for input(s): "VITAMINB12", "FOLATE", "FERRITIN", "TIBC", "IRON", "RETICCTPCT" in the last 72 hours. Urine analysis:  Component Value Date/Time   COLORURINE AMBER (A) 09/11/2021 0526   APPEARANCEUR CLOUDY (A) 09/11/2021 0526   LABSPEC 1.016 09/11/2021 0526   PHURINE 5.0 09/11/2021 0526   GLUCOSEU 50 (A) 09/11/2021 0526   HGBUR NEGATIVE 09/11/2021 0526   BILIRUBINUR NEGATIVE 09/11/2021 0526   KETONESUR NEGATIVE 09/11/2021 0526   PROTEINUR 100 (A) 09/11/2021 0526   NITRITE NEGATIVE 09/11/2021 0526   LEUKOCYTESUR NEGATIVE 09/11/2021 0526   Sepsis Labs: '@LABRCNTIP'$ (procalcitonin:4,lacticidven:4)  ) Recent Results (from the past 240 hour(s))  Culture, blood (Routine X 2) w Reflex to ID Panel     Status: Abnormal (Preliminary result)   Collection Time: 09/11/21  5:38 AM   Specimen: Left Antecubital; Blood  Result Value Ref Range Status   Specimen Description   Final    LEFT ANTECUBITAL BOTTLES DRAWN AEROBIC AND ANAEROBIC Performed at [facility], [address] [place], [place] [address]    Special Requests   Final    Blood Culture adequate volume Performed at [facility], [address] [place], [place] [address]    Culture  Setup Time   Final    GRAM POSITIVE COCCI ANAEROBIC AND AEROBIC BOTTLES Gram Stain Report Called to,Read Back By and Verified With: SHEPARD,B'@2330'$  BY MATTHEWS, B 9.11.2023 CRITICAL RESULT CALLED TO, READ BACK BY AND VERIFIED WITH: T RUDISILL,PHARMD'@0313'$  09/12/21 [place]    Culture (A)  Final    STAPHYLOCOCCUS EPIDERMIDIS THE SIGNIFICANCE OF  ISOLATING THIS ORGANISM FROM A SINGLE SET OF BLOOD CULTURES WHEN MULTIPLE SETS ARE DRAWN IS UNCERTAIN. PLEASE NOTIFY THE MICROBIOLOGY DEPARTMENT WITHIN ONE WEEK IF SPECIATION AND SENSITIVITIES ARE REQUIRED. CULTURE REINCUBATED FOR BETTER GROWTH Performed at [facility], [place] [address] [place], [place] [address]    Report Status PENDING  Incomplete  Blood Culture ID Panel (Reflexed)     Status: Abnormal   Collection Time: 09/11/21  5:38 AM  Result Value Ref Range Status   Enterococcus faecalis NOT DETECTED NOT DETECTED Final   Enterococcus Faecium NOT DETECTED NOT DETECTED Final   Listeria monocytogenes NOT DETECTED NOT DETECTED Final   Staphylococcus species DETECTED (A) NOT DETECTED Final    Comment: CRITICAL RESULT CALLED TO, READ BACK BY AND VERIFIED WITH: T RUDISILL,PHARMD'@0315'$  09/12/21 [place]    Staphylococcus aureus (BCID) NOT DETECTED NOT DETECTED Final   Staphylococcus epidermidis DETECTED (A) NOT DETECTED Final    Comment: Methicillin (oxacillin) resistant coagulase negative staphylococcus. Possible blood culture contaminant (unless isolated from more than one blood culture draw or clinical case suggests pathogenicity). No antibiotic treatment is indicated for blood  culture contaminants. CRITICAL RESULT CALLED TO, READ BACK BY AND VERIFIED WITH: T RUDISILL,PHARMD'@0315'$  09/12/21 [place]    Staphylococcus lugdunensis NOT DETECTED NOT DETECTED Final   Streptococcus species NOT DETECTED NOT DETECTED Final   Streptococcus agalactiae NOT DETECTED NOT DETECTED Final   Streptococcus pneumoniae NOT DETECTED NOT DETECTED Final   Streptococcus pyogenes NOT DETECTED NOT DETECTED Final   A.calcoaceticus-baumannii NOT DETECTED NOT DETECTED Final   Bacteroides fragilis NOT DETECTED NOT DETECTED Final   Enterobacterales NOT DETECTED NOT DETECTED Final   Enterobacter cloacae complex NOT DETECTED NOT DETECTED Final   Escherichia coli NOT DETECTED NOT DETECTED Final   Klebsiella aerogenes NOT  DETECTED NOT DETECTED Final   Klebsiella oxytoca NOT DETECTED NOT DETECTED Final   Klebsiella pneumoniae NOT DETECTED NOT DETECTED Final   Proteus species NOT DETECTED NOT DETECTED Final   Salmonella species NOT DETECTED NOT DETECTED Final   Serratia marcescens NOT DETECTED NOT DETECTED Final  Haemophilus influenzae NOT DETECTED NOT DETECTED Final   Neisseria meningitidis NOT DETECTED NOT DETECTED Final   Pseudomonas aeruginosa NOT DETECTED NOT DETECTED Final   Stenotrophomonas maltophilia NOT DETECTED NOT DETECTED Final   Candida albicans NOT DETECTED NOT DETECTED Final   Candida auris NOT DETECTED NOT DETECTED Final   Candida glabrata NOT DETECTED NOT DETECTED Final   Candida krusei NOT DETECTED NOT DETECTED Final   Candida parapsilosis NOT DETECTED NOT DETECTED Final   Candida tropicalis NOT DETECTED NOT DETECTED Final   Cryptococcus neoformans/gattii NOT DETECTED NOT DETECTED Final   Methicillin resistance mecA/C DETECTED (A) NOT DETECTED Final    Comment: CRITICAL RESULT CALLED TO, READ BACK BY AND VERIFIED WITH: T RUDISILL,PHARMD'@0315'$  09/12/21 [place] Performed at [facility], [place] [address] [place], [place] [address]   Culture, blood (Routine X 2) w Reflex to ID Panel     Status: Abnormal   Collection Time: 09/11/21  5:44 AM   Specimen: Right Antecubital; Blood  Result Value Ref Range Status   Specimen Description   Final    RIGHT ANTECUBITAL BOTTLES DRAWN AEROBIC AND ANAEROBIC Performed at [facility], [address] [place], [place] [address]    Special Requests   Final    Blood Culture adequate volume Performed at [facility], [address], [place], [place] [address]    Culture  Setup Time   Final    GRAM POSITIVE COCCI AEROBIC BOTTLE ONLY Gram Stain Report Called to,Read Back By and Verified With: [facility] M. AT [place] AT 3491 ON [id number] BY THOMPSON S. CRITICAL VALUE NOTED.  VALUE IS CONSISTENT WITH PREVIOUSLY REPORTED AND CALLED VALUE.    Culture (A)  Final     STAPHYLOCOCCUS EPIDERMIDIS SUSCEPTIBILITIES PERFORMED ON PREVIOUS CULTURE WITHIN THE LAST 5 DAYS. Performed at [facility], [place] [address], [place], [place] [address]    Report Status 09/14/2021 FINAL  Final  MRSA Next Gen by PCR, Nasal     Status: None   Collection Time: 09/11/21  8:00 PM   Specimen: Nasal Mucosa; Nasal Swab  Result Value Ref Range Status   MRSA by PCR Next Gen NOT DETECTED NOT DETECTED Final    Comment: (NOTE) The GeneXpert MRSA Assay (FDA approved for NASAL specimens only), is one component of a comprehensive MRSA colonization surveillance program. It is not intended to diagnose MRSA infection nor to guide or monitor treatment for MRSA infections. Test performance is not FDA approved in patients less than 14 years old. Performed at [facility], [place] [address], [place], [place] [address]   Culture, blood (Routine X 2) w Reflex to ID Panel     Status: None (Preliminary result)   Collection Time: 09/14/21  6:18 AM   Specimen: BLOOD LEFT HAND  Result Value Ref Range Status   Specimen Description BLOOD LEFT HAND  Final   Special Requests   Final    BOTTLES DRAWN AEROBIC AND ANAEROBIC Blood Culture adequate volume   Culture   Final    NO GROWTH <12 HOURS Performed at [facility], [place] [address] [place],  [address]    Report Status PENDING  Incomplete  Culture, blood (Routine X 2) w Reflex to ID Panel     Status: None (Preliminary result)   Collection Time: 09/14/21  6:27 AM   Specimen: BLOOD LEFT HAND  Result Value Ref Range Status   Specimen Description BLOOD LEFT HAND  Final   Special Requests   Final  BOTTLES DRAWN AEROBIC AND ANAEROBIC Blood Culture adequate volume   Culture   Final    NO GROWTH <12 HOURS Performed at [facility], [place] [address], [place], [place] [address]    Report Status PENDING  Incomplete      Studies: No results found.  Scheduled Meds:  atorvastatin  20 mg Oral QHS   bethanechol  5 mg Oral  BID   Chlorhexidine Gluconate Cloth  6 each Topical Daily   escitalopram  10 mg Oral Daily   folic acid  1 mg Oral Daily   insulin aspart  0-15 Units Subcutaneous Q4H   levothyroxine  200 mcg Oral Q0600   pantoprazole  40 mg Oral Daily   risperiDONE  0.25 mg Oral QHS   rivaroxaban  20 mg Oral Q supper   sodium chloride flush  10-40 mL Intracatheter Q12H   umeclidinium-vilanterol  1 puff Inhalation Daily    Continuous Infusions:  sodium chloride 10 mL/hr at 09/14/21 0350   cefTRIAXone (ROCEPHIN)  IV 2 g (09/14/21 1039)   vancomycin 1,250 mg (09/14/21 0353)     LOS: 3 days     Alma Friendly, MD [facility]  If 7PM-7AM, please contact night-coverage [email or web address] 09/14/2021, 1:43 PM

## 2021-09-14 NOTE — Progress Notes (Signed)
Pharmacy Antibiotic Note  Terri Casey is a 61 y.o. female admitted on 09/11/2021 with staph epi bacteremia - sensitivities pending  Vancomycin random level this PM 17 (trough range)  Plan: Increase Vancomycin to 1250 mg iv Q 12 hours  Continue Ceftriaxone 2 grams iv Q 24 hours  Height: '5\' 9"'$  (175.3 cm) Weight: 115.6 kg (254 lb 13.6 oz) IBW/kg (Calculated) : 66.2  Temp (24hrs), Avg:98 F (36.7 C), Min:97.4 F (36.3 C), Max:98.9 F (37.2 C)  Recent Labs  Lab 09/11/21 0446 09/11/21 0645 09/11/21 1241 09/12/21 0308 09/13/21 0353 09/13/21 1212 09/14/21 0400 09/14/21 1450  WBC 14.8*  --   --  14.0* 7.9  --  6.2  --   CREATININE 1.28*  --   --  1.05* 0.92 0.86 0.79  --   LATICACIDVEN 6.0* 4.8* 1.8  --   --   --   --   --   VANCORANDOM  --   --   --   --   --   --   --  17     Estimated Creatinine Clearance: 99 mL/min (by C-G formula based on SCr of 0.79 mg/dL).    Allergies  Allergen Reactions   Estrogens Hives   Mustard Seed Hives   Strawberry Extract Hives    Thank you Anette Guarneri, PharmD 09/14/2021

## 2021-09-14 NOTE — Progress Notes (Signed)
Dr. Horris Latino at bedside and is aware that patient is alert but drowsy and with exertion desats down into the 80's and takes awhile to recover. Currently 93% on 3L nasal cannula. No orders at this time.

## 2021-09-14 NOTE — TOC Initial Note (Addendum)
Transition of Care [facility]) - Initial/Assessment Note    Patient Details  Name: Terri Casey MRN: [id number] Date of Birth: 01/31/1960  [facility] CM/SW Contact:    Zenon Mayo, RN Phone Number: 09/14/2021, 3:34 PM  Clinical Narrative:                 Patient is from home with son, she has home oxygen with adapt 2-3 liters, also has a bipap that she uses at night.  She states she has an Photographer, NCM offered choice from Medicare. Gov list for HHPT, she does not have a preference.  She states her son will transport her home at dc.  NCM unable to get HHPT in [place], NCM spoke with patient she will try to do outpatient at [facility] , NCM called [facility] outpatient rehab , left message for a return call.   Expected Discharge Plan: [place] Barriers to Discharge: Continued Medical Work up   Patient Goals and CMS Choice Patient states their goals for this hospitalization and ongoing recovery are:: return home CMS Medicare.gov Compare Post Acute Care list provided to:: Patient Choice offered to / list presented to : Patient  Expected Discharge Plan and Services Expected Discharge Plan: [place]   Discharge Planning Services: CM Consult Post Acute Care Choice: [place] arrangements for the past 2 months: Single Family Home                   DME Agency: NA                  Prior Living Arrangements/Services Living arrangements for the past 2 months: Single Family Home Lives with:: Adult Children Patient language and need for interpreter reviewed:: Yes Do you feel safe going back to the place where you live?: Yes      Need for Family Participation in Patient Care: Yes (Comment) Care giver support system in place?: Yes (comment) Current home services: DME (home oxygen 2-3 liters with Adapt, , bipap at night with adapt,) Criminal Activity/Legal Involvement Pertinent to Current  Situation/Hospitalization: No - Comment as needed  Activities of Daily Living      Permission Sought/Granted                  Emotional Assessment Appearance:: Appears stated age Attitude/Demeanor/Rapport: Engaged Affect (typically observed): Appropriate Orientation: : Oriented to Self, Oriented to Place, Oriented to  Time, Oriented to Situation Alcohol / Substance Use: Not Applicable Psych Involvement: No (comment)  Admission diagnosis:  Respiratory failure ([place]) [J96.90] Encounter for intubation [Z01.818] Septic shock ([place]) [A41.9, R65.21] Acute respiratory failure with hypoxia and hypercapnia ([place]) [J96.01, J96.02] Acute hypoxemic respiratory failure ([place]) [J96.01] Patient Active Problem List   Diagnosis Date Noted   Acute hypoxemic respiratory failure ([place]) 09/11/2021   Septic shock ([place]) 09/11/2021   Respiratory failure ([place]) 09/11/2021   Hashimoto's thyroiditis 09/05/2021   Chronic back pain greater than 3 months duration 09/05/2021   Allergic rhinitis 09/05/2021   Hypercapnia 09/05/2021   Osteopenia 09/05/2021   Periodic limb movements of sleep 09/05/2021   Suspicious nevus left buttock 03/19/2021   Acute hypercapnic respiratory failure ([place]) 03/16/2021   Hypothermia 03/16/2021   Chronic diastolic CHF (congestive heart failure) ([place]) 03/16/2021   Chronic respiratory failure with hypoxia and hypercapnia ([place]) 78/29/5621   Acute diastolic CHF (congestive heart failure) ([place]) 10/07/2019   Chronic low back pain    Goals of care,  counseling/discussion    Palliative care by specialist    Acute respiratory failure with hypoxia and hypercapnia (HCC) 10/05/2019   AF (paroxysmal atrial fibrillation) ([place]) 10/05/2019   Elevated brain natriuretic peptide (BNP) level 08/21/2019   Noncompliance with medication regimen 08/21/2019   Acute on chronic respiratory failure with hypoxia and hypercapnia ([place]) 06/09/2019   Obesity, Class III, BMI 40-49.9 (morbid obesity) ([place])  06/09/2019   Thrombocytopenia ([place]) 06/09/2019   Nontoxic multinodular goiter 07/06/2015   Pericardial effusion: Per 2 d echo 03/26/2015 03/27/2015   Acute on chronic respiratory failure ([place]) 03/26/2015   PNA (pneumonia) 03/26/2015   Nocturnal hypoxia 03/25/2015   Acute respiratory failure with hypoxia ([place]) 03/25/2015   COPD exacerbation ([place]) 03/25/2015   COPD (chronic obstructive pulmonary disease) ([place]) 03/25/2015   Anxiety    Hypothyroidism 03/03/2015   Vitamin D deficiency 03/03/2015   Hyperlipidemia 07/08/2007   CIGARETTE SMOKER 07/08/2007   Essential hypertension, benign 07/08/2007   EMPHYSEMA-on home O2 07/08/2007   Asthma 07/08/2007   COPD GOLD ? / still smoking 07/08/2007   Sleep apnea 07/08/2007   WEIGHT GAIN 07/08/2007   PCP:  [name], NP Pharmacy:   [place], [place] - [address] [place] [address] Phone: [phone] Fax: [phone]     Social Determinants of Health (SDOH) Interventions    Readmission Risk Interventions    09/14/2021    3:21 PM 03/21/2021   10:52 AM 03/20/2021   12:14 PM  Readmission Risk Prevention Plan  Post Dischage Appt  Complete   Medication Screening  Complete Complete  Transportation Screening  Complete Complete  PCP or Specialist Appt within 3-5 Days Complete    HRI or [place] Complete    Social Work Consult for [place] Planning/Counseling Complete    Palliative Care Screening Not Applicable    Medication Review Press photographer) Complete

## 2021-09-15 DIAGNOSIS — A419 Sepsis, unspecified organism: Secondary | ICD-10-CM | POA: Diagnosis not present

## 2021-09-15 DIAGNOSIS — J9601 Acute respiratory failure with hypoxia: Secondary | ICD-10-CM | POA: Diagnosis not present

## 2021-09-15 DIAGNOSIS — I480 Paroxysmal atrial fibrillation: Secondary | ICD-10-CM | POA: Diagnosis not present

## 2021-09-15 DIAGNOSIS — I5032 Chronic diastolic (congestive) heart failure: Secondary | ICD-10-CM | POA: Diagnosis not present

## 2021-09-15 LAB — CULTURE, BLOOD (ROUTINE X 2): Special Requests: ADEQUATE

## 2021-09-15 LAB — CBC
HCT: 36.2 % (ref 36.0–46.0)
Hemoglobin: 10.1 g/dL — ABNORMAL LOW (ref 12.0–15.0)
MCH: 30.5 pg (ref 26.0–34.0)
MCHC: 27.9 g/dL — ABNORMAL LOW (ref 30.0–36.0)
MCV: 109.4 fL — ABNORMAL HIGH (ref 80.0–100.0)
Platelets: 136 10*3/uL — ABNORMAL LOW (ref 150–400)
RBC: 3.31 MIL/uL — ABNORMAL LOW (ref 3.87–5.11)
RDW: 15.8 % — ABNORMAL HIGH (ref 11.5–15.5)
WBC: 5.7 10*3/uL (ref 4.0–10.5)
nRBC: 0 % (ref 0.0–0.2)

## 2021-09-15 LAB — BASIC METABOLIC PANEL
Anion gap: 7 (ref 5–15)
BUN: 7 mg/dL — ABNORMAL LOW (ref 8–23)
CO2: 34 mmol/L — ABNORMAL HIGH (ref 22–32)
Calcium: 8.4 mg/dL — ABNORMAL LOW (ref 8.9–10.3)
Chloride: 101 mmol/L (ref 98–111)
Creatinine, Ser: 0.7 mg/dL (ref 0.44–1.00)
GFR, Estimated: >60 mL/min (ref >60)
Glucose, Bld: 89 mg/dL (ref 70–99)
Potassium: 4.2 mmol/L (ref 3.5–5.1)
Sodium: 142 mmol/L (ref 135–145)

## 2021-09-15 LAB — GLUCOSE, CAPILLARY
Glucose-Capillary: 100 mg/dL — ABNORMAL HIGH (ref 70–99)
Glucose-Capillary: 100 mg/dL — ABNORMAL HIGH (ref 70–99)
Glucose-Capillary: 106 mg/dL — ABNORMAL HIGH (ref 70–99)
Glucose-Capillary: 78 mg/dL (ref 70–99)
Glucose-Capillary: 82 mg/dL (ref 70–99)
Glucose-Capillary: 89 mg/dL (ref 70–99)

## 2021-09-15 LAB — VITAMIN B12: Vitamin B-12: 312 pg/mL (ref 180–914)

## 2021-09-15 LAB — IRON AND TIBC
Iron: 43 ug/dL (ref 28–170)
Saturation Ratios: 16 % (ref 10.4–31.8)
TIBC: 272 ug/dL (ref 250–450)
UIBC: 229 ug/dL

## 2021-09-15 LAB — FOLATE: Folate: 16.6 ng/mL (ref >5.9)

## 2021-09-15 LAB — FERRITIN: Ferritin: 45 ng/mL (ref 11–307)

## 2021-09-15 MED ORDER — BUDESONIDE 0.25 MG/2ML IN SUSP
0.2500 mg | Freq: Two times a day (BID) | RESPIRATORY_TRACT | Status: DC
  Administered 2021-09-15 – 2021-09-25 (×20): 0.25 mg via RESPIRATORY_TRACT
  Filled 2021-09-15 (×20): qty 2

## 2021-09-15 MED ORDER — IPRATROPIUM-ALBUTEROL 0.5-2.5 (3) MG/3ML IN SOLN
3.0000 mL | Freq: Four times a day (QID) | RESPIRATORY_TRACT | Status: DC
  Administered 2021-09-15 – 2021-09-16 (×4): 3 mL via RESPIRATORY_TRACT
  Filled 2021-09-15 (×4): qty 3

## 2021-09-15 MED ORDER — DM-GUAIFENESIN ER 30-600 MG PO TB12
1.0000 | ORAL_TABLET | Freq: Two times a day (BID) | ORAL | Status: DC
  Administered 2021-09-15 – 2021-09-25 (×20): 1 via ORAL
  Filled 2021-09-15 (×20): qty 1

## 2021-09-15 MED ORDER — ACETYLCYSTEINE 20 % IN SOLN
3.0000 mL | Freq: Three times a day (TID) | RESPIRATORY_TRACT | Status: DC
  Administered 2021-09-15: 3 mL via RESPIRATORY_TRACT
  Filled 2021-09-15 (×2): qty 4

## 2021-09-15 MED ORDER — ALBUTEROL SULFATE (2.5 MG/3ML) 0.083% IN NEBU
2.5000 mg | INHALATION_SOLUTION | RESPIRATORY_TRACT | Status: DC | PRN
  Administered 2021-09-15: 2.5 mg via RESPIRATORY_TRACT
  Filled 2021-09-15: qty 3

## 2021-09-15 NOTE — Evaluation (Signed)
Occupational Therapy Evaluation Patient Details Name: Terri Casey MRN: [id number] DOB: 04-18-1960 Today's Date: 09/15/2021   History of Present Illness pt is a 61 y/o female presenting to ED 9/11 with unresponsiveness and agonal breathing.  Found on the floor tipped over in her wheelchair in respiratory distress.  Failed BiPAP then intubated in the ED.  Concern for aspiration PNA.  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    PMH:  CHF, COPD, HTN, DM, Recent L foot fx/4th, 5th metatarsals and in CAM boot.   Clinical Impression   Pt in bed upon therapy arrival, engrossed in her cell phone with her telemetry alarm going off due to her nasal oxygen being off and SpO2 at 77%. Assisted patient with replacing her oxygen as she reports that she removed it to blow her nose and forgot to put it back on. With increased time and VC to close her mouth, breathe in, open mouth to breathe out, pt's SpO2 did rise to 93%.  SpO2 level fluctuated throughout session during bed activity and sit to stand using RW for NT to change bed linens. Pt presents with decreased cognition related to awareness, problem solving, and clinical reasoning, decreased strength and activity tolerance as well as fluctuating levels of alertness due to SpO2 and requires increased assistance to complete BADL tasks. Recommend OPOT at discharge to focus on mentioned deficits. OT will follow patient acutely.     Recommendations for follow up therapy are one component of a multi-disciplinary discharge planning process, led by the attending physician.  Recommendations may be updated based on patient status, additional functional criteria and insurance authorization.   Follow Up Recommendations  Outpatient OT    Assistance Recommended at Discharge PRN  Patient can return home with the following A little help with walking and/or transfers;A little help with  bathing/dressing/bathroom;Assistance with cooking/housework;Help with stairs or ramp for entrance;Assist for transportation;Direct supervision/assist for financial management;Direct supervision/assist for medications management    Functional Status Assessment  Patient has had a recent decline in their functional status and demonstrates the ability to make significant improvements in function in a reasonable and predictable amount of time.  Equipment Recommendations  None recommended by OT       Precautions / Restrictions Precautions Precautions: Fall Required Braces or Orthoses: Other Brace Other Brace: CAM boot Lt foot Restrictions Weight Bearing Restrictions: Yes LLE Weight Bearing: Non weight bearing (ok to bear weight through heel only)      Mobility Bed Mobility Overal bed mobility: Needs Assistance Bed Mobility: Supine to Sit, Sit to Sidelying       Sit to supine: HOB elevated, Min guard Sit to sidelying: Min assist General bed mobility comments: Assist for safety and lines. VC for step sequencing and to remain on task    Transfers Overall transfer level: Needs assistance Equipment used: Rolling walker (2 wheels) Transfers: Sit to/from Stand Sit to Stand: Min assist, From elevated surface      Balance Overall balance assessment: Mild deficits observed, not formally tested       ADL either performed or assessed with clinical judgement   ADL Overall ADL's : Needs assistance/impaired Eating/Feeding: Modified independent;Bed level   Grooming: Wash/dry hands;Wash/dry face;Set up;Sitting   Upper Body Bathing: Minimal assistance;Sitting   Lower Body Bathing: Maximal assistance;Sit to/from stand   Upper Body Dressing : Set up;Sitting   Lower Body Dressing: Maximal assistance;Sit to/from stand   Toilet Transfer: Minimal assistance;Rolling walker (2 wheels);BSC/3in1  Toileting- Clothing Manipulation and Hygiene: Moderate assistance;Sit to/from stand                Vision Baseline Vision/History: 0 No visual deficits Ability to See in Adequate Light: 0 Adequate (Able to read text on cellphone) Patient Visual Report: No change from baseline Vision Assessment?: No apparent visual deficits     Perception Perception Perception: Within Functional Limits   Praxis Praxis Praxis: Impaired Praxis Impairment Details: Motor planning Praxis-Other Comments: mild impairment noted    Pertinent Vitals/Pain Pain Assessment Pain Assessment: No/denies pain Faces Pain Scale: No hurt     Hand Dominance Right   Extremity/Trunk Assessment Upper Extremity Assessment Upper Extremity Assessment: Generalized weakness   Lower Extremity Assessment Lower Extremity Assessment: Defer to PT evaluation   Cervical / Trunk Assessment Cervical / Trunk Assessment: Kyphotic   Communication Communication Communication: No difficulties   Cognition Arousal/Alertness:  (in and out of alertness; periods of lethargy although able to become alert with verbal cues.) Behavior During Therapy: WFL for tasks assessed/performed Overall Cognitive Status: No family/caregiver present to determine baseline cognitive functioning       General Comments: Would follow 1 step commands with occassional "spacing out," and needed directions/commands repeated.                Home Living Family/patient expects to be discharged to:: Private residence Living Arrangements: Children Available Help at Discharge: Family;Personal care attendant;Available PRN/intermittently Type of Home: House Home Access: Stairs to enter [employer] of Steps: 3-4 Entrance Stairs-Rails: Right Home Layout: One level     Bathroom Shower/Tub: Teacher, early years/pre: Handicapped height Bathroom Accessibility: Yes How Accessible: Accessible via walker Home Equipment: BSC/3in1;Rolling Walker (2 wheels);Toilet riser;Cane - single point;Wheelchair - manual          Prior  Functioning/Environment Prior Level of Function : Needs assist       Physical Assist : Mobility (physical);ADLs (physical) Mobility (physical): Bed mobility;Transfers;Gait;Stairs ADLs (physical): Bathing Mobility Comments: Household ambulator with frequent leaning on walls, furniture, uses RW for longer distances ADLs Comments: home aides from 8am to 3pm x 5 days/week, assisted by family        OT Problem List: Decreased strength;Decreased coordination;Decreased cognition;Decreased activity tolerance;Decreased safety awareness;Impaired balance (sitting and/or standing);Decreased knowledge of use of DME or AE;Decreased knowledge of precautions      OT Treatment/Interventions: Self-care/ADL training;Therapeutic exercise;Therapeutic activities;Neuromuscular education;Cognitive remediation/compensation;Energy conservation;Visual/perceptual remediation/compensation;DME and/or AE instruction;Patient/family education;Balance training;Manual therapy;Modalities    OT Goals(Current goals can be found in the care plan section) Acute Rehab OT Goals Patient Stated Goal: none stated OT Goal Formulation: Patient unable to participate in goal setting Time For Goal Achievement: 09/29/21 Potential to Achieve Goals: Good  OT Frequency: Min 2X/week       AM-PAC OT "6 Clicks" Daily Activity     Outcome Measure Help from another person eating meals?: None Help from another person taking care of personal grooming?: A Little Help from another person toileting, which includes using toliet, bedpan, or urinal?: A Lot Help from another person bathing (including washing, rinsing, drying)?: A Lot Help from another person to put on and taking off regular upper body clothing?: A Little Help from another person to put on and taking off regular lower body clothing?: A Lot 6 Click Score: 16   End of Session Equipment Utilized During Treatment: Gait belt;Rolling walker (2 wheels);Oxygen  Activity Tolerance:  Patient tolerated treatment well Patient left: in bed;with call bell/phone within reach;with bed alarm set  OT  Visit Diagnosis: History of falling (Z91.81);Muscle weakness (generalized) (M62.81)                Time: 3202-3343 OT Time Calculation (min): 28 min Charges:  OT General Charges $OT Visit: 1 Visit OT Evaluation $OT Eval Moderate Complexity: 1 Mod  [employer], OTR/L,CBIS  Supplemental OT - [facility] and [facility]   Pressley Barsky, Clarene Duke 09/15/2021, 1:52 PM

## 2021-09-15 NOTE — Progress Notes (Signed)
Physical Therapy Treatment Patient Details Name: Terri Casey MRN: [id number] DOB: [birth year] Today's Date: 09/15/2021   History of Present Illness pt is a 61 y/o female presenting to ED 9/11 with unresponsiveness and agonal breathing.  Found on the floor tipped over in her wheelchair in respiratory distress.  Failed BiPAP then intubated in the ED.  Concern for aspiration PNA.  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    PMH:  CHF, COPD, HTN, DM, Recent L foot fx/4th, 5th metatarsals and in CAM boot.    PT Comments    Pt was seen for mobility on side of bed and afterward asked nursing for assist in getting her O2 gauge back to appropriate level.  Pt removed her cannula and helped her replace it.  Pt is somewhat distracted and has moments of looking down and seeming distracted, but can refocus with verbal intervention.  PT and pt discussed her ability to assist herself, and requires a significant amount of help to scoot up the bed and reposition.  Follow along with her for transfers, as she could stand with minor help but desaturated as low as 76% upon standing on 15L.  Her wall reading was 13L and had to go to 15 on a tank due to length of her line.  Sat upon sitting again was 92%. Follow along with her to continue mobility as her vitals will tolerate, and encourage her to be up in chair as able.    Recommendations for follow up therapy are one component of a multi-disciplinary discharge planning process, led by the attending physician.  Recommendations may be updated based on patient status, additional functional criteria and insurance authorization.  Follow Up Recommendations  Home health PT     Assistance Recommended at Discharge Intermittent Supervision/Assistance  Patient can return home with the following A little help with walking and/or transfers;A little help with bathing/dressing/bathroom;Assistance with  cooking/housework;Direct supervision/assist for medications management;Direct supervision/assist for financial management;Assist for transportation;Help with stairs or ramp for entrance   Equipment Recommendations  None recommended by PT    Recommendations for Other Services       Precautions / Restrictions Precautions Precautions: Fall Precaution Comments: flexiseal, catheter, IV, telemetry Required Braces or Orthoses: Other Brace Other Brace: CAM boot Lt foot Restrictions Weight Bearing Restrictions: Yes LLE Weight Bearing:  (WB through heel only) Other Position/Activity Restrictions: Cam boot on LLE to stand     Mobility  Bed Mobility Overal bed mobility: Needs Assistance Bed Mobility: Supine to Sit, Sit to Supine     Supine to sit: Min assist Sit to supine: Min assist   General bed mobility comments: min assist for lines and trunk support to get OOB, for legs to return to bed    Transfers Overall transfer level: Needs assistance Equipment used: Rolling walker (2 wheels) Transfers: Sit to/from Stand Sit to Stand: Min guard, Min assist           General transfer comment: support power up and control of sitting    Ambulation/Gait               General Gait Details: unable to attempt   Stairs             Wheelchair Mobility    Modified Rankin (Stroke Patients Only)       Balance Overall balance assessment: Needs assistance Sitting-balance support: Feet supported Sitting balance-Leahy Scale: Fair     Standing balance support: Bilateral upper  extremity supported, During functional activity Standing balance-Leahy Scale: Poor                              Cognition Arousal/Alertness: Awake/alert Behavior During Therapy: Flat affect Overall Cognitive Status: No family/caregiver present to determine baseline cognitive functioning                                 General Comments: pt is slow to respond verbally to  requests and to complete tasks, but is fairly appropriate with answers to questions        Exercises      General Comments General comments (skin integrity, edema, etc.): Pt is up to side of bed with help and with management of lines and O2 could stand briefly.  Requires assistance to control descent      Pertinent Vitals/Pain Pain Assessment Pain Assessment: No/denies pain    Home Living Family/patient expects to be discharged to:: Private residence Living Arrangements: Children Available Help at Discharge: Family;Personal care attendant;Available PRN/intermittently Type of Home: House Home Access: Stairs to enter Entrance Stairs-Rails: Right Entrance Stairs-Number of Steps: 3-4   Home Layout: One level Home Equipment: BSC/3in1;Rolling Walker (2 wheels);Toilet riser;Cane - single point;Wheelchair - manual      Prior Function            PT Goals (current goals can now be found in the care plan section) Progress towards PT goals: Progressing toward goals    Frequency    Min 3X/week      PT Plan Current plan remains appropriate    Co-evaluation              AM-PAC PT "6 Clicks" Mobility   Outcome Measure  Help needed turning from your back to your side while in a flat bed without using bedrails?: A Little Help needed moving from lying on your back to sitting on the side of a flat bed without using bedrails?: A Lot Help needed moving to and from a bed to a chair (including a wheelchair)?: A Lot Help needed standing up from a chair using your arms (e.g., wheelchair or bedside chair)?: A Little Help needed to walk in hospital room?: Total Help needed climbing 3-5 steps with a railing? : Total 6 Click Score: 12    End of Session Equipment Utilized During Treatment: Gait belt;Oxygen Activity Tolerance: Patient limited by fatigue Patient left: in bed;with call bell/phone within reach;with bed alarm set Nurse Communication: Mobility status PT Visit  Diagnosis: Unsteadiness on feet (R26.81);Muscle weakness (generalized) (M62.81)     Time: 7408-1448 PT Time Calculation (min) (ACUTE ONLY): 30 min  Charges:  $Therapeutic Activity: 23-37 mins    Ramond Dial 09/15/2021, 2:19 PM  Mee Hives, PT PhD Acute Rehab Dept. Number: [place] and [place]

## 2021-09-15 NOTE — Progress Notes (Signed)
BSE completed, full report to follow.  Pt with functional oropharyngeal swallow based on clinical evaluation.  She passed 3 ounce Yale swallow screen - does admit to significant reflux PTA but denies being on a reflux medication at home.  Recommend continue reg/thin diet with general precautions.   Kathleen Lime, MS [facility] SLP Acute Rehab Services Office [phone] Pager [phone]

## 2021-09-15 NOTE — Evaluation (Signed)
Clinical/Bedside Swallow Evaluation Patient Details  Name: Terri Casey MRN: [id number] Date of Birth: 1960/08/01  Today's Date: 09/15/2021 Time: SLP Start Time (ACUTE ONLY): 92 SLP Stop Time (ACUTE ONLY): 1144 SLP Time Calculation (min) (ACUTE ONLY): 39 min  Past Medical History:  Past Medical History:  Diagnosis Date   Anxiety    Asthma    CHF (congestive heart failure) (HCC)    COPD (chronic obstructive pulmonary disease) ([place])    Diabetes mellitus without complication ([place])    Hypertension    Thyroid disease    Past Surgical History:  Past Surgical History:  Procedure Laterality Date   ABDOMINAL HYSTERECTOMY     HPI:  61 yo female found with agonal respirations and down from wheelchair. She was found to have septic shock/AHRF, aspiration pna = failed Bipap - intubated 9/11-9/13/2023- now on 12 Liters oxygen.  PMH + for COPD, CHF, DM, HTN, hypothyroidism, Afib, tobacco abuse and obesity and recent broken foot.  Swallow eval ordered.   Pt denies dysphagia prior to admit.    Assessment / Plan / Recommendation  Clinical Impression  Pt with functional oropharyngeal swallow based on clinical evaluation.  Negative CN exam and pt reports chronic hoarseness.  She easily passed 3 ounce Yale swallow screen.  She does admit to significant reflux PTA but denies being on a reflux medication at home.  Pt observed consuming graham crackers, applesauce, Glucerna, Apple juice and water.  No indication of aspiration and she was observed to exhale post-swallow.  She does admit to becoming short of breath at times with intake - advised her to follow strict aspiration/esophageal reflux precautions.  Recommend continue reg/thin diet with general precautions. SLP Visit Diagnosis: Dysphagia, unspecified (R13.10)    Aspiration Risk  No limitations;Mild aspiration risk    Diet Recommendation Regular;Thin liquid   Liquid Administration via: Cup;Straw Medication Administration: Whole meds with  liquid Supervision: Patient able to self feed Compensations: Slow rate;Small sips/bites;Other (Comment) (start intake with liquids due to xerostomia and high oxygen needs) Postural Changes: Remain upright for at least 30 minutes after po intake;Seated upright at 90 degrees    Other  Recommendations Oral Care Recommendations: Oral care BID    Recommendations for follow up therapy are one component of a multi-disciplinary discharge planning process, led by the attending physician.  Recommendations may be updated based on patient status, additional functional criteria and insurance authorization.  Follow up Recommendations No SLP follow up      Assistance Recommended at Discharge None  Functional Status Assessment Patient has not had a recent decline in their functional status  Frequency and Duration     N/a       Prognosis    N/a    Swallow Study   General Date of Onset: 09/15/21 HPI: 61 yo female found with agonal respirations and down from wheelchair. She was found to have septic shock/AHRF, aspiration pna = failed Bipap - intubated 9/11-9/13/2023- now on 12 Liters oxygen.  PMH + for COPD, CHF, DM, HTN, hypothyroidism, Afib, tobacco abuse and obesity and recent broken foot.  Swallow eval ordered.   Pt denies dysphagia prior to admit. Type of Study: Bedside Swallow Evaluation Diet Prior to this Study: Regular;Thin liquids Temperature Spikes Noted: No Respiratory Status: Nasal cannula (12 Liters) Behavior/Cognition: Alert;Cooperative;Pleasant mood Oral Cavity Assessment: Within Functional Limits Oral Care Completed by SLP: No Oral Cavity - Dentition: Dentures, top;Dentures, bottom Vision: Functional for self-feeding Self-Feeding Abilities: Able to feed self Patient Positioning: Upright in bed Baseline Vocal  Quality: Hoarse Volitional Cough: Strong Volitional Swallow: Able to elicit    Oral/Motor/Sensory Function Overall Oral Motor/Sensory Function: Within functional limits    Ice Chips Ice chips: Within functional limits   Thin Liquid Thin Liquid: Within functional limits Presentation: Self Fed;Straw;Cup    Nectar Thick Nectar Thick Liquid: Within functional limits Presentation: Self Fed;Cup   Honey Thick Honey Thick Liquid: Not tested   Puree Puree: Within functional limits Presentation: Self Fed;Spoon   Solid     Solid: Within functional limits Presentation: Self Fed;Spoon      Terri Casey 09/15/2021,5:08 PM   Kathleen Lime, MS [facility] SLP [place] Office [phone] Pager [phone]

## 2021-09-15 NOTE — Progress Notes (Signed)
PROGRESS NOTE  Terri Casey ZJQ:[id number] DOB: [birth year] DOA: 09/11/2021 PCP: [name], NP  HPI/Recap of past 10 hours: 61 year old female active smoker with hx of COPD, diastolic HF, diabetes mellitus, hypertension, hypothyroidism, paroxysmal A-fib, OSA, presented to ED with unresponsiveness and agonal breathing. Found on floor tipped over in wheelchair in respiratory distress. Failed BiPAP and then intubated in the ED. PCCM admitted for respiratory failure.  Patient was intubated on 9/11, further stabilized extubated on 9/13.  [facility] assumed care on 9/14.    Today, patient noted to be requiring more oxygen, noted to be more congested, difficult to cough up sputum, reports worsening shortness of breath.  Patient denies any chest pain, fever/chills.  Order placed for BiPAP at bedtime   Assessment/Plan: Principal Problem:   Septic shock ([facility]) Active Problems:   Acute hypercapnic respiratory failure (HCC)   Chronic diastolic CHF (congestive heart failure) (HCC)   AF (paroxysmal atrial fibrillation) (HCC)   Essential hypertension, benign   Hyperlipidemia   Hypothyroidism   PNA (pneumonia)   Acute hypoxemic respiratory failure (HCC)   Respiratory failure (HCC)   Acute encephalopathy secondary to sepsis Septic shock secondary aspiration, possible staph epidermidis bacteremia Encephalopathy resolved Currently off Levophed- BP has remained stable 1 of 3 Bcx methicillin resistant staph epidermidis. MRSA negative, repeat blood cultures pending Continue Vanco and ceftriaxone, pending culture report Monitor closely  Acute hypoxemic respiratory failure 2/2 likely aspiration COPD Active smoker CT Chest with left mediastinal shift with fluid/debri in left main bronchus Intubated on 9/11, extubated on 9/13, currently now on about 9L of oxygen Duonebs, Pulmicort, Mucomyst nebulizer, flutter valve, pulm hygiene Continue abx as above Swallow eval Advised to quit  smoking Supplemental O2  Diarrhea Continue rectal tube for now Monitor closely  Paroxysmal atrial fibrillation Currently in NSR Continue Xarelto, hold metoprolol due to soft BP Telemetry  Chronic diastolic heart failure Hypertension Appears euvolemic Hold home Lasix for now, hold amlodipine,  Macrocytic Anemia  Hemoglobin currently down to 10.4 Anemia panel unremarkable Daily CBC   Fracture of 4th and 5th metatarsal  Hx fall in August Followed by Ortho outpatient. Scheduled for 9/19 Plan for consult if still inpatient NWB on forefoot   Hypothyroidism Synthroid   ?Hx migraines Family confirmed these medications are for migraines Hold imitrex and topamax for now   Chronic pain Resume home Percocet, hold flexeril   Depression Resume home lexapro  Obesity Lifestyle modification advised  BiPAP at bedtime       Estimated body mass index is 38.29 kg/m as calculated from the following:   Height as of this encounter: '5\' 9"'$  (1.753 m).   Weight as of this encounter: 117.6 kg.     Code Status: Full  Family Communication: None at bedside  Disposition Plan: Status is: Inpatient Remains inpatient appropriate because: Level of care      Consultants: PCCM  Procedures: Mechanical ventilation  Antimicrobials: Vancomycin, ceftriaxone  DVT prophylaxis: Xarelto   Objective: Vitals:   09/15/21 0800 09/15/21 0804 09/15/21 1112 09/15/21 1220  BP: (!) 148/64  126/67 133/72  Pulse: 95  82 93  Resp: '18  14 16  '$ Temp: 97.9 F (36.6 C)  (!) 97.4 F (36.3 C) (!) 97.4 F (36.3 C)  TempSrc: Oral  Oral Oral  SpO2: 90% (!) 84% 90% 92%  Weight:      Height:        Intake/Output Summary (Last 24 hours) at 09/15/2021 1930 Last data filed at 09/15/2021 2409  Gross per 24 hour  Intake 240 ml  Output 950 ml  Net -710 ml   Filed Weights   09/13/21 2214 09/14/21 0330 09/15/21 0350  Weight: 115.7 kg 115.6 kg 117.6 kg    Exam: General: NAD, chronically  ill-appearing, appears older than stated age Cardiovascular: S1, S2 present Respiratory: Coarse breath sounds bilaterally Abdomen: Soft, nontender, nondistended, bowel sounds present Musculoskeletal: No bilateral pedal edema noted Skin: Normal Psychiatry: Normal mood     Data Reviewed: CBC: Recent Labs  Lab 09/11/21 0446 09/11/21 2039 09/12/21 0308 09/12/21 0423 09/12/21 1634 09/13/21 0353 09/14/21 0400 09/15/21 0500  WBC 14.8*  --  14.0*  --   --  7.9 6.2 5.7  HGB 13.0   < > 11.2* 11.9* 12.2 9.2* 10.4* 10.1*  HCT 47.6*   < > 37.1 35.0* 36.0 29.5* 34.9* 36.2  MCV 115.8*  --  104.8*  --   --  101.7* 105.8* 109.4*  PLT 174  --  154  --   --  107* 128* 136*   < > = values in this interval not displayed.   Basic Metabolic Panel: Recent Labs  Lab 09/12/21 0308 09/12/21 0423 09/12/21 1441 09/12/21 1634 09/13/21 0353 09/13/21 1212 09/14/21 0400 09/15/21 0500  NA 137   < >  --  137 140 143 145 142  K 3.9   < >  --  3.0* 2.5* 3.1* 3.9 4.2  CL 95*  --   --   --  99 102 103 101  CO2 35*  --   --   --  31 33* 33* 34*  GLUCOSE 96  --   --   --  95 108* 95 89  BUN 15  --   --   --  '20 16 11 '$ 7*  CREATININE 1.05*  --   --   --  0.92 0.86 0.79 0.70  CALCIUM 8.6*  --   --   --  7.6* 8.1* 8.3* 8.4*  MG 1.6*  --  1.9  --  1.9  --  2.3  --   PHOS 1.8*  --  3.6  --  3.0  --  4.2  --    < > = values in this interval not displayed.   GFR: Estimated Creatinine Clearance: 99.9 mL/min (by C-G formula based on SCr of 0.7 mg/dL). Liver Function Tests: Recent Labs  Lab 09/11/21 0446  AST 22  ALT 11  ALKPHOS 131*  BILITOT 0.9  PROT 6.8  ALBUMIN 3.2*   No results for input(s): "LIPASE", "AMYLASE" in the last 168 hours. No results for input(s): "AMMONIA" in the last 168 hours. Coagulation Profile: Recent Labs  Lab 09/11/21 0446  INR 1.7*   Cardiac Enzymes: No results for input(s): "CKTOTAL", "CKMB", "CKMBINDEX", "TROPONINI" in the last 168 hours. BNP (last 3 results) No  results for input(s): "PROBNP" in the last 8760 hours. HbA1C: No results for input(s): "HGBA1C" in the last 72 hours.  CBG: Recent Labs  Lab 09/15/21 0023 09/15/21 0357 09/15/21 0744 09/15/21 1110 09/15/21 1650  GLUCAP 100* 89 78 106* 100*   Lipid Profile: Recent Labs    09/13/21 0353  TRIG 189*   Thyroid Function Tests: No results for input(s): "TSH", "T4TOTAL", "FREET4", "T3FREE", "THYROIDAB" in the last 72 hours.  Anemia Panel: Recent Labs    09/15/21 0500  VITAMINB12 312  FOLATE 16.6  FERRITIN 45  TIBC 272  IRON 43   Urine analysis:    Component Value Date/Time  COLORURINE AMBER (A) 09/11/2021 0526   APPEARANCEUR CLOUDY (A) 09/11/2021 0526   LABSPEC 1.016 09/11/2021 0526   PHURINE 5.0 09/11/2021 0526   GLUCOSEU 50 (A) 09/11/2021 0526   HGBUR NEGATIVE 09/11/2021 0526   BILIRUBINUR NEGATIVE 09/11/2021 0526   KETONESUR NEGATIVE 09/11/2021 0526   PROTEINUR 100 (A) 09/11/2021 0526   NITRITE NEGATIVE 09/11/2021 0526   LEUKOCYTESUR NEGATIVE 09/11/2021 0526   Sepsis Labs: '@LABRCNTIP'$ (procalcitonin:4,lacticidven:4)  ) Recent Results (from the past 240 hour(s))  Culture, blood (Routine X 2) w Reflex to ID Panel     Status: Abnormal   Collection Time: 09/11/21  5:38 AM   Specimen: Left Antecubital; Blood  Result Value Ref Range Status   Specimen Description   Final    LEFT ANTECUBITAL BOTTLES DRAWN AEROBIC AND ANAEROBIC Performed at [facility], [address], [place], [place] [address]    Special Requests   Final    Blood Culture adequate volume Performed at [facility], [address], [place], [place] [address]    Culture  Setup Time   Final    GRAM POSITIVE COCCI ANAEROBIC AND AEROBIC BOTTLES Gram Stain Report Called to,Read Back By and Verified With: SHEPARD,B'@2330'$  BY MATTHEWS, B 9.11.2023 CRITICAL RESULT CALLED TO, READ BACK BY AND VERIFIED WITH: T RUDISILL,PHARMD'@0313'$  09/12/21 [place]    Culture (A)  Final    STAPHYLOCOCCUS EPIDERMIDIS THE  SIGNIFICANCE OF ISOLATING THIS ORGANISM FROM A SINGLE SET OF BLOOD CULTURES WHEN MULTIPLE SETS ARE DRAWN IS UNCERTAIN. PLEASE NOTIFY THE MICROBIOLOGY DEPARTMENT WITHIN ONE WEEK IF SPECIATION AND SENSITIVITIES ARE REQUIRED. Performed at [facility], [place] [address], [place], [place] [address]    Report Status 09/15/2021 FINAL  Final  Blood Culture ID Panel (Reflexed)     Status: Abnormal   Collection Time: 09/11/21  5:38 AM  Result Value Ref Range Status   Enterococcus faecalis NOT DETECTED NOT DETECTED Final   Enterococcus Faecium NOT DETECTED NOT DETECTED Final   Listeria monocytogenes NOT DETECTED NOT DETECTED Final   Staphylococcus species DETECTED (A) NOT DETECTED Final    Comment: CRITICAL RESULT CALLED TO, READ BACK BY AND VERIFIED WITH: T RUDISILL,PHARMD'@0315'$  09/12/21 [place]    Staphylococcus aureus (BCID) NOT DETECTED NOT DETECTED Final   Staphylococcus epidermidis DETECTED (A) NOT DETECTED Final    Comment: Methicillin (oxacillin) resistant coagulase negative staphylococcus. Possible blood culture contaminant (unless isolated from more than one blood culture draw or clinical case suggests pathogenicity). No antibiotic treatment is indicated for blood  culture contaminants. CRITICAL RESULT CALLED TO, READ BACK BY AND VERIFIED WITH: T RUDISILL,PHARMD'@0315'$  09/12/21 [place]    Staphylococcus lugdunensis NOT DETECTED NOT DETECTED Final   Streptococcus species NOT DETECTED NOT DETECTED Final   Streptococcus agalactiae NOT DETECTED NOT DETECTED Final   Streptococcus pneumoniae NOT DETECTED NOT DETECTED Final   Streptococcus pyogenes NOT DETECTED NOT DETECTED Final   A.calcoaceticus-baumannii NOT DETECTED NOT DETECTED Final   Bacteroides fragilis NOT DETECTED NOT DETECTED Final   Enterobacterales NOT DETECTED NOT DETECTED Final   Enterobacter cloacae complex NOT DETECTED NOT DETECTED Final   Escherichia coli NOT DETECTED NOT DETECTED Final   Klebsiella aerogenes NOT DETECTED NOT DETECTED  Final   Klebsiella oxytoca NOT DETECTED NOT DETECTED Final   Klebsiella pneumoniae NOT DETECTED NOT DETECTED Final   Proteus species NOT DETECTED NOT DETECTED Final   Salmonella species NOT DETECTED NOT DETECTED Final   Serratia marcescens NOT DETECTED NOT DETECTED Final   Haemophilus influenzae NOT DETECTED NOT DETECTED Final   Neisseria  meningitidis NOT DETECTED NOT DETECTED Final   Pseudomonas aeruginosa NOT DETECTED NOT DETECTED Final   Stenotrophomonas maltophilia NOT DETECTED NOT DETECTED Final   Candida albicans NOT DETECTED NOT DETECTED Final   Candida auris NOT DETECTED NOT DETECTED Final   Candida glabrata NOT DETECTED NOT DETECTED Final   Candida krusei NOT DETECTED NOT DETECTED Final   Candida parapsilosis NOT DETECTED NOT DETECTED Final   Candida tropicalis NOT DETECTED NOT DETECTED Final   Cryptococcus neoformans/gattii NOT DETECTED NOT DETECTED Final   Methicillin resistance mecA/C DETECTED (A) NOT DETECTED Final    Comment: CRITICAL RESULT CALLED TO, READ BACK BY AND VERIFIED WITH: T RUDISILL,PHARMD'@0315'$  09/12/21 [place] Performed at [facility], [address], [place], [place] [address]   Culture, blood (Routine X 2) w Reflex to ID Panel     Status: Abnormal   Collection Time: 09/11/21  5:44 AM   Specimen: Right Antecubital; Blood  Result Value Ref Range Status   Specimen Description   Final    RIGHT ANTECUBITAL BOTTLES DRAWN AEROBIC AND ANAEROBIC Performed at [facility], [address], [place], [place] [address]    Special Requests   Final    Blood Culture adequate volume Performed at [facility], [address] [place], [place] [address]    Culture  Setup Time   Final    GRAM POSITIVE COCCI AEROBIC BOTTLE ONLY Gram Stain Report Called to,Read Back By and Verified With: [facility] M. AT [place] AT 4818 ON [id number] BY THOMPSON S. CRITICAL VALUE NOTED.  VALUE IS CONSISTENT WITH PREVIOUSLY REPORTED AND CALLED VALUE.    Culture (A)  Final    STAPHYLOCOCCUS  EPIDERMIDIS SUSCEPTIBILITIES PERFORMED ON PREVIOUS CULTURE WITHIN THE LAST 5 DAYS. Performed at [facility], [place] [address], [place], [place] [address]    Report Status 09/14/2021 FINAL  Final  MRSA Next Gen by PCR, Nasal     Status: None   Collection Time: 09/11/21  8:00 PM   Specimen: Nasal Mucosa; Nasal Swab  Result Value Ref Range Status   MRSA by PCR Next Gen NOT DETECTED NOT DETECTED Final    Comment: (NOTE) The GeneXpert MRSA Assay (FDA approved for NASAL specimens only), is one component of a comprehensive MRSA colonization surveillance program. It is not intended to diagnose MRSA infection nor to guide or monitor treatment for MRSA infections. Test performance is not FDA approved in patients less than 7 years old. Performed at [facility], [place] [address], [place], [place] [address]   Culture, blood (Routine X 2) w Reflex to ID Panel     Status: None (Preliminary result)   Collection Time: 09/14/21  6:18 AM   Specimen: BLOOD LEFT HAND  Result Value Ref Range Status   Specimen Description BLOOD LEFT HAND  Final   Special Requests   Final    BOTTLES DRAWN AEROBIC AND ANAEROBIC Blood Culture adequate volume   Culture   Final    NO GROWTH 1 DAY Performed at [facility], [place] [address], [place], [place] [address]    Report Status PENDING  Incomplete  Culture, blood (Routine X 2) w Reflex to ID Panel     Status: None (Preliminary result)   Collection Time: 09/14/21  6:27 AM   Specimen: BLOOD LEFT HAND  Result Value Ref Range Status   Specimen Description BLOOD LEFT HAND  Final   Special Requests   Final    BOTTLES DRAWN AEROBIC AND ANAEROBIC Blood Culture adequate  volume   Culture   Final    NO GROWTH 1 DAY Performed at [facility], [place] [address], [place], [place] [address]    Report Status PENDING  Incomplete      Studies: No results found.  Scheduled Meds:  atorvastatin  20 mg Oral QHS   bethanechol  5 mg Oral BID   Chlorhexidine  Gluconate Cloth  6 each Topical Daily   escitalopram  10 mg Oral Daily   feeding supplement  237 mL Oral BID BM   folic acid  1 mg Oral Daily   insulin aspart  0-15 Units Subcutaneous Q4H   levothyroxine  200 mcg Oral Q0600   multivitamin with minerals  1 tablet Oral Daily   pantoprazole  40 mg Oral Daily   risperiDONE  0.25 mg Oral QHS   rivaroxaban  20 mg Oral Q supper   sodium chloride flush  10-40 mL Intracatheter Q12H   umeclidinium-vilanterol  1 puff Inhalation Daily    Continuous Infusions:  sodium chloride Stopped (09/14/21 1500)   cefTRIAXone (ROCEPHIN)  IV 2 g (09/15/21 1152)   vancomycin 1,250 mg (09/15/21 1809)     LOS: 4 days     Alma Friendly, MD [facility]  If 7PM-7AM, please contact night-coverage [email or web address] 09/15/2021, 7:30 PM

## 2021-09-16 ENCOUNTER — Inpatient Hospital Stay (HOSPITAL_COMMUNITY): Payer: Medicaid Other

## 2021-09-16 DIAGNOSIS — J9601 Acute respiratory failure with hypoxia: Secondary | ICD-10-CM | POA: Diagnosis not present

## 2021-09-16 DIAGNOSIS — A419 Sepsis, unspecified organism: Secondary | ICD-10-CM | POA: Diagnosis not present

## 2021-09-16 DIAGNOSIS — I5032 Chronic diastolic (congestive) heart failure: Secondary | ICD-10-CM | POA: Diagnosis not present

## 2021-09-16 DIAGNOSIS — J9602 Acute respiratory failure with hypercapnia: Secondary | ICD-10-CM | POA: Diagnosis not present

## 2021-09-16 DIAGNOSIS — I480 Paroxysmal atrial fibrillation: Secondary | ICD-10-CM | POA: Diagnosis not present

## 2021-09-16 LAB — BLOOD GAS, ARTERIAL
Acid-Base Excess: 8.6 mmol/L — ABNORMAL HIGH (ref 0.0–2.0)
Bicarbonate: 39.5 mmol/L — ABNORMAL HIGH (ref 20.0–28.0)
Drawn by: 164
O2 Saturation: 98.5 %
Patient temperature: 37
pCO2 arterial: 90 mmHg (ref 32–48)
pH, Arterial: 7.25 — ABNORMAL LOW (ref 7.35–7.45)
pO2, Arterial: 89 mmHg (ref 83–108)

## 2021-09-16 LAB — BASIC METABOLIC PANEL
Anion gap: 6 (ref 5–15)
BUN: 6 mg/dL — ABNORMAL LOW (ref 8–23)
CO2: 36 mmol/L — ABNORMAL HIGH (ref 22–32)
Calcium: 8.3 mg/dL — ABNORMAL LOW (ref 8.9–10.3)
Chloride: 99 mmol/L (ref 98–111)
Creatinine, Ser: 0.67 mg/dL (ref 0.44–1.00)
GFR, Estimated: >60 mL/min (ref >60)
Glucose, Bld: 92 mg/dL (ref 70–99)
Potassium: 4.4 mmol/L (ref 3.5–5.1)
Sodium: 141 mmol/L (ref 135–145)

## 2021-09-16 LAB — CBC
HCT: 33.3 % — ABNORMAL LOW (ref 36.0–46.0)
Hemoglobin: 9.5 g/dL — ABNORMAL LOW (ref 12.0–15.0)
MCH: 31.5 pg (ref 26.0–34.0)
MCHC: 28.5 g/dL — ABNORMAL LOW (ref 30.0–36.0)
MCV: 110.3 fL — ABNORMAL HIGH (ref 80.0–100.0)
Platelets: 121 10*3/uL — ABNORMAL LOW (ref 150–400)
RBC: 3.02 MIL/uL — ABNORMAL LOW (ref 3.87–5.11)
RDW: 15.3 % (ref 11.5–15.5)
WBC: 4.9 10*3/uL (ref 4.0–10.5)
nRBC: 0 % (ref 0.0–0.2)

## 2021-09-16 LAB — GLUCOSE, CAPILLARY
Glucose-Capillary: 102 mg/dL — ABNORMAL HIGH (ref 70–99)
Glucose-Capillary: 109 mg/dL — ABNORMAL HIGH (ref 70–99)
Glucose-Capillary: 74 mg/dL (ref 70–99)
Glucose-Capillary: 87 mg/dL (ref 70–99)
Glucose-Capillary: 97 mg/dL (ref 70–99)
Glucose-Capillary: 99 mg/dL (ref 70–99)

## 2021-09-16 LAB — VANCOMYCIN, PEAK: Vancomycin Pk: 36 ug/mL (ref 30–40)

## 2021-09-16 LAB — BRAIN NATRIURETIC PEPTIDE: B Natriuretic Peptide: 102.1 pg/mL — ABNORMAL HIGH (ref 0.0–100.0)

## 2021-09-16 MED ORDER — REVEFENACIN 175 MCG/3ML IN SOLN
175.0000 ug | Freq: Every day | RESPIRATORY_TRACT | Status: DC
  Administered 2021-09-17 – 2021-09-25 (×9): 175 ug via RESPIRATORY_TRACT
  Filled 2021-09-16 (×10): qty 3

## 2021-09-16 MED ORDER — ARFORMOTEROL TARTRATE 15 MCG/2ML IN NEBU
15.0000 ug | INHALATION_SOLUTION | Freq: Two times a day (BID) | RESPIRATORY_TRACT | Status: DC
  Administered 2021-09-16 – 2021-09-25 (×18): 15 ug via RESPIRATORY_TRACT
  Filled 2021-09-16 (×18): qty 2

## 2021-09-16 MED ORDER — FUROSEMIDE 10 MG/ML IJ SOLN
40.0000 mg | Freq: Two times a day (BID) | INTRAMUSCULAR | Status: DC
  Administered 2021-09-16 – 2021-09-19 (×6): 40 mg via INTRAVENOUS
  Filled 2021-09-16 (×6): qty 4

## 2021-09-16 MED ORDER — ACETYLCYSTEINE 20 % IN SOLN
3.0000 mL | Freq: Three times a day (TID) | RESPIRATORY_TRACT | Status: DC
  Administered 2021-09-16 (×2): 3 mL via RESPIRATORY_TRACT
  Filled 2021-09-16 (×3): qty 4

## 2021-09-16 NOTE — Significant Event (Signed)
Rapid Response Event Note   Reason for Call :  Hypoxia/Low oxygen levels  Initial Focused Assessment:  Called by the RT who noted that the patient had an oxygen level drop to 79% while she was applying the chest PT vest. The patient was on 11L of oxygen at the time. The RT placed the patient on Bipap (40%) and her oxygen level was still in the low 80's. Bipap increased to 100% and the patient returned to the high 90's O2. The patient has been pulling low VT on Bipap (371)  Patient was asleep on my arrival but awoke to my voice. Patient is alert and oriented but falls back asleep after my questions. Lung sounds are tight and I do not hear much air movement, especially in her lower lobes. Patient has had thick secretions and has been receiving Mucomyst.   BP 117/74 HR 94 O2 98 (100% O2 on bipap)   Interventions:  Chest xray- increase in left pleural effusion and new small right pleural effusion. Diffuse bilateral interstitial pulmonary opacity, most likely edema ABG 7.25/99 CO2/89 Po2/39.5  Plan of Care:  Patient to remain on 3E wearing bipap Patient to stop anticoagulation so CCM can drain pleural effusion Stop mucomyst and chest PT    Event Summary:   MD Notified: Dr. Horris Latino and Dr. Valeta Harms bedside Call Time:1355 Arrival Time: 1400 End Time: [place], RN

## 2021-09-16 NOTE — Progress Notes (Signed)
PROGRESS NOTE  Terri Casey YIF:[id number] DOB: 08-13-1960 DOA: 09/11/2021 PCP: [name], NP  HPI/Recap of past 75 hours: 61 year old female active smoker with hx of COPD, diastolic HF, diabetes mellitus, hypertension, hypothyroidism, paroxysmal A-fib, OSA, presented to ED with unresponsiveness and agonal breathing. Found on floor tipped over in wheelchair in respiratory distress. Failed BiPAP and then intubated in the ED. PCCM admitted for respiratory failure.  Patient was intubated on 9/11, further stabilized extubated on 9/13.  [facility] assumed care on 9/14.    Today, patient noted to be requiring more O2, lethargic.   Assessment/Plan: Principal Problem:   Septic shock (HCC) Active Problems:   Acute hypercapnic respiratory failure (HCC)   Chronic diastolic CHF (congestive heart failure) (HCC)   AF (paroxysmal atrial fibrillation) (HCC)   Essential hypertension, benign   Hyperlipidemia   Hypothyroidism   PNA (pneumonia)   Acute hypoxemic respiratory failure (HCC)   Respiratory failure (HCC)   Acute encephalopathy secondary to sepsis Septic shock secondary aspiration, possible staph epidermidis bacteremia Encephalopathy resolved Currently off Levophed- BP has remained stable 1 of 3 Bcx methicillin resistant staph epidermidis. MRSA negative, repeat blood cultures pending Continue Vanco and ceftriaxone Monitor closely  Acute on chronic hypoxemic hypercapnic respiratory failure  Aspiration pneumonia COPD Active smoker Left-sided pleural effusion shown on x-ray on 09/16/2021 CT Chest with left mediastinal shift with fluid/debri in left main bronchus Intubated on 9/11, extubated on 9/13, currently now on BiPAP on 09/16/2021 Chest x-ray showed left pleural effusion with pulmonary edema PCCM reconsulted, appreciate recs Duonebs, Pulmicort, Brovana nebulizer, flutter valve, pulm hygiene CT chest without contrast pending Plan for thoracentesis, hold  Xarelto Continue abx as above Low threshold to move to ICU  Diarrhea Continue rectal tube for now Monitor closely  Paroxysmal atrial fibrillation Currently in NSR Hold Xarelto as above, hold metoprolol due to soft BP Telemetry  Chronic diastolic heart failure, likely acute Hypertension Appears overloaded Chest x-ray with pulmonary edema, left-sided pleural effusion Echo pending Plan for thoracentesis as above Start IV Lasix, hold amlodipine  Macrocytic Anemia  Hemoglobin currently down to 10.4 Anemia panel unremarkable Daily CBC   Fracture of 4th and 5th metatarsal  Hx fall in August Followed by Ortho outpatient. Scheduled for 9/19 Plan for consult if still inpatient NWB on forefoot   Hypothyroidism Synthroid   ?Hx migraines Family confirmed these medications are for migraines Hold imitrex and topamax for now   Chronic pain Resume home Percocet, hold flexeril   Depression Resume home lexapro  Obesity Lifestyle modification advised  Tobacco abuse Reluctant to quit  BiPAP at bedtime and nap times       Estimated body mass index is 38.94 kg/m as calculated from the following:   Height as of this encounter: '5\' 9"'$  (1.753 m).   Weight as of this encounter: 119.6 kg.     Code Status: Full  Family Communication: None at bedside  Disposition Plan: Status is: Inpatient Remains inpatient appropriate because: Level of care      Consultants: PCCM  Procedures: Mechanical ventilation  Antimicrobials: Vancomycin, ceftriaxone  DVT prophylaxis: Hold Xarelto-thoracentesis   Objective: Vitals:   09/16/21 1414 09/16/21 1427 09/16/21 1600 09/16/21 1605  BP: (!) 106/50 117/75 117/61   Pulse: 95 94 83   Resp: '20 14 19   '$ Temp:   98 F (36.7 C)   TempSrc:   Oral   SpO2: 93% 100% 92% 98%  Weight:      Height:  Intake/Output Summary (Last 24 hours) at 09/16/2021 1748 Last data filed at 09/16/2021 1721 Gross per 24 hour  Intake 480 ml   Output 2100 ml  Net -1620 ml   Filed Weights   09/14/21 0330 09/15/21 0350 09/16/21 0014  Weight: 115.6 kg 117.6 kg 119.6 kg    Exam: General: Mild distress, chronically ill-appearing, appears older than stated age Cardiovascular: S1, S2 present Respiratory: Diminished breath sounds bilaterally Abdomen: Soft, nontender, nondistended, bowel sounds present Musculoskeletal: No bilateral pedal edema noted Skin: Normal Psychiatry: Normal mood     Data Reviewed: CBC: Recent Labs  Lab 09/12/21 0308 09/12/21 0423 09/12/21 1634 09/13/21 0353 09/14/21 0400 09/15/21 0500 09/16/21 0650  WBC 14.0*  --   --  7.9 6.2 5.7 4.9  HGB 11.2*   < > 12.2 9.2* 10.4* 10.1* 9.5*  HCT 37.1   < > 36.0 29.5* 34.9* 36.2 33.3*  MCV 104.8*  --   --  101.7* 105.8* 109.4* 110.3*  PLT 154  --   --  107* 128* 136* 121*   < > = values in this interval not displayed.   Basic Metabolic Panel: Recent Labs  Lab 09/12/21 0308 09/12/21 0423 09/12/21 1441 09/12/21 1634 09/13/21 0353 09/13/21 1212 09/14/21 0400 09/15/21 0500 09/16/21 0650  NA 137   < >  --    < > 140 143 145 142 141  K 3.9   < >  --    < > 2.5* 3.1* 3.9 4.2 4.4  CL 95*  --   --   --  99 102 103 101 99  CO2 35*  --   --   --  31 33* 33* 34* 36*  GLUCOSE 96  --   --   --  95 108* 95 89 92  BUN 15  --   --   --  '20 16 11 '$ 7* 6*  CREATININE 1.05*  --   --   --  0.92 0.86 0.79 0.70 0.67  CALCIUM 8.6*  --   --   --  7.6* 8.1* 8.3* 8.4* 8.3*  MG 1.6*  --  1.9  --  1.9  --  2.3  --   --   PHOS 1.8*  --  3.6  --  3.0  --  4.2  --   --    < > = values in this interval not displayed.   GFR: Estimated Creatinine Clearance: 100.8 mL/min (by C-G formula based on SCr of 0.67 mg/dL). Liver Function Tests: Recent Labs  Lab 09/11/21 0446  AST 22  ALT 11  ALKPHOS 131*  BILITOT 0.9  PROT 6.8  ALBUMIN 3.2*   No results for input(s): "LIPASE", "AMYLASE" in the last 168 hours. No results for input(s): "AMMONIA" in the last 168  hours. Coagulation Profile: Recent Labs  Lab 09/11/21 0446  INR 1.7*   Cardiac Enzymes: No results for input(s): "CKTOTAL", "CKMB", "CKMBINDEX", "TROPONINI" in the last 168 hours. BNP (last 3 results) No results for input(s): "PROBNP" in the last 8760 hours. HbA1C: No results for input(s): "HGBA1C" in the last 72 hours.  CBG: Recent Labs  Lab 09/16/21 0020 09/16/21 0454 09/16/21 0753 09/16/21 1202 09/16/21 1631  GLUCAP 102* 97 87 109* 99   Lipid Profile: No results for input(s): "CHOL", "HDL", "LDLCALC", "TRIG", "CHOLHDL", "LDLDIRECT" in the last 72 hours.  Thyroid Function Tests: No results for input(s): "TSH", "T4TOTAL", "FREET4", "T3FREE", "THYROIDAB" in the last 72 hours.  Anemia Panel: Recent Labs  09/15/21 0500  VITAMINB12 312  FOLATE 16.6  FERRITIN 45  TIBC 272  IRON 43   Urine analysis:    Component Value Date/Time   COLORURINE AMBER (A) 09/11/2021 0526   APPEARANCEUR CLOUDY (A) 09/11/2021 0526   LABSPEC 1.016 09/11/2021 0526   PHURINE 5.0 09/11/2021 0526   GLUCOSEU 50 (A) 09/11/2021 0526   HGBUR NEGATIVE 09/11/2021 0526   BILIRUBINUR NEGATIVE 09/11/2021 0526   KETONESUR NEGATIVE 09/11/2021 0526   PROTEINUR 100 (A) 09/11/2021 0526   NITRITE NEGATIVE 09/11/2021 0526   LEUKOCYTESUR NEGATIVE 09/11/2021 0526   Sepsis Labs: '@LABRCNTIP'$ (procalcitonin:4,lacticidven:4)  ) Recent Results (from the past 240 hour(s))  Culture, blood (Routine X 2) w Reflex to ID Panel     Status: Abnormal   Collection Time: 09/11/21  5:38 AM   Specimen: Left Antecubital; Blood  Result Value Ref Range Status   Specimen Description   Final    LEFT ANTECUBITAL BOTTLES DRAWN AEROBIC AND ANAEROBIC Performed at [facility], [address], [place], [place] [address]    Special Requests   Final    Blood Culture adequate volume Performed at [facility], [address], [place], [place] [address]    Culture  Setup Time   Final    GRAM POSITIVE COCCI ANAEROBIC AND AEROBIC  BOTTLES Gram Stain Report Called to,Read Back By and Verified With: SHEPARD,B'@2330'$  BY MATTHEWS, B 9.11.2023 CRITICAL RESULT CALLED TO, READ BACK BY AND VERIFIED WITH: T RUDISILL,PHARMD'@0313'$  09/12/21 [place]    Culture (A)  Final    STAPHYLOCOCCUS EPIDERMIDIS THE SIGNIFICANCE OF ISOLATING THIS ORGANISM FROM A SINGLE SET OF BLOOD CULTURES WHEN MULTIPLE SETS ARE DRAWN IS UNCERTAIN. PLEASE NOTIFY THE MICROBIOLOGY DEPARTMENT WITHIN ONE WEEK IF SPECIATION AND SENSITIVITIES ARE REQUIRED. Performed at [facility], [place] [address], [place], [place] [address]    Report Status 09/15/2021 FINAL  Final  Blood Culture ID Panel (Reflexed)     Status: Abnormal   Collection Time: 09/11/21  5:38 AM  Result Value Ref Range Status   Enterococcus faecalis NOT DETECTED NOT DETECTED Final   Enterococcus Faecium NOT DETECTED NOT DETECTED Final   Listeria monocytogenes NOT DETECTED NOT DETECTED Final   Staphylococcus species DETECTED (A) NOT DETECTED Final    Comment: CRITICAL RESULT CALLED TO, READ BACK BY AND VERIFIED WITH: T RUDISILL,PHARMD'@0315'$  09/12/21 [place]    Staphylococcus aureus (BCID) NOT DETECTED NOT DETECTED Final   Staphylococcus epidermidis DETECTED (A) NOT DETECTED Final    Comment: Methicillin (oxacillin) resistant coagulase negative staphylococcus. Possible blood culture contaminant (unless isolated from more than one blood culture draw or clinical case suggests pathogenicity). No antibiotic treatment is indicated for blood  culture contaminants. CRITICAL RESULT CALLED TO, READ BACK BY AND VERIFIED WITH: T RUDISILL,PHARMD'@0315'$  09/12/21 [place]    Staphylococcus lugdunensis NOT DETECTED NOT DETECTED Final   Streptococcus species NOT DETECTED NOT DETECTED Final   Streptococcus agalactiae NOT DETECTED NOT DETECTED Final   Streptococcus pneumoniae NOT DETECTED NOT DETECTED Final   Streptococcus pyogenes NOT DETECTED NOT DETECTED Final   A.calcoaceticus-baumannii NOT DETECTED NOT DETECTED Final    Bacteroides fragilis NOT DETECTED NOT DETECTED Final   Enterobacterales NOT DETECTED NOT DETECTED Final   Enterobacter cloacae complex NOT DETECTED NOT DETECTED Final   Escherichia coli NOT DETECTED NOT DETECTED Final   Klebsiella aerogenes NOT DETECTED NOT DETECTED Final   Klebsiella oxytoca NOT DETECTED NOT DETECTED Final   Klebsiella pneumoniae NOT DETECTED NOT DETECTED Final   Proteus species NOT DETECTED NOT DETECTED Final  Salmonella species NOT DETECTED NOT DETECTED Final   Serratia marcescens NOT DETECTED NOT DETECTED Final   Haemophilus influenzae NOT DETECTED NOT DETECTED Final   Neisseria meningitidis NOT DETECTED NOT DETECTED Final   Pseudomonas aeruginosa NOT DETECTED NOT DETECTED Final   Stenotrophomonas maltophilia NOT DETECTED NOT DETECTED Final   Candida albicans NOT DETECTED NOT DETECTED Final   Candida auris NOT DETECTED NOT DETECTED Final   Candida glabrata NOT DETECTED NOT DETECTED Final   Candida krusei NOT DETECTED NOT DETECTED Final   Candida parapsilosis NOT DETECTED NOT DETECTED Final   Candida tropicalis NOT DETECTED NOT DETECTED Final   Cryptococcus neoformans/gattii NOT DETECTED NOT DETECTED Final   Methicillin resistance mecA/C DETECTED (A) NOT DETECTED Final    Comment: CRITICAL RESULT CALLED TO, READ BACK BY AND VERIFIED WITH: T RUDISILL,PHARMD'@0315'$  09/12/21 [place] Performed at [facility], [address], [place], [place] [address]   Culture, blood (Routine X 2) w Reflex to ID Panel     Status: Abnormal   Collection Time: 09/11/21  5:44 AM   Specimen: Right Antecubital; Blood  Result Value Ref Range Status   Specimen Description   Final    RIGHT ANTECUBITAL BOTTLES DRAWN AEROBIC AND ANAEROBIC Performed at [facility], [address] [place], [place] [address]    Special Requests   Final    Blood Culture adequate volume Performed at [facility], [address], [place], [place] [address]    Culture  Setup Time   Final    GRAM POSITIVE COCCI  AEROBIC BOTTLE ONLY Gram Stain Report Called to,Read Back By and Verified With: [facility]. AT [place] AT 3704 ON [id number] BY THOMPSON S. CRITICAL VALUE NOTED.  VALUE IS CONSISTENT WITH PREVIOUSLY REPORTED AND CALLED VALUE.    Culture (A)  Final    STAPHYLOCOCCUS EPIDERMIDIS SUSCEPTIBILITIES PERFORMED ON PREVIOUS CULTURE WITHIN THE LAST 5 DAYS. Performed at [facility], [place] [address] [place], [place] [address]    Report Status 09/14/2021 FINAL  Final  MRSA Next Gen by PCR, Nasal     Status: None   Collection Time: 09/11/21  8:00 PM   Specimen: Nasal Mucosa; Nasal Swab  Result Value Ref Range Status   MRSA by PCR Next Gen NOT DETECTED NOT DETECTED Final    Comment: (NOTE) The GeneXpert MRSA Assay (FDA approved for NASAL specimens only), is one component of a comprehensive MRSA colonization surveillance program. It is not intended to diagnose MRSA infection nor to guide or monitor treatment for MRSA infections. Test performance is not FDA approved in patients less than 50 years old. Performed at [facility], [place] [address], [place], [place] [address]   Culture, blood (Routine X 2) w Reflex to ID Panel     Status: None (Preliminary result)   Collection Time: 09/14/21  6:18 AM   Specimen: BLOOD LEFT HAND  Result Value Ref Range Status   Specimen Description BLOOD LEFT HAND  Final   Special Requests   Final    BOTTLES DRAWN AEROBIC AND ANAEROBIC Blood Culture adequate volume   Culture   Final    NO GROWTH 2 DAYS Performed at [facility], [place] [address], [place], [place] [address]    Report Status PENDING  Incomplete  Culture, blood (Routine X 2) w Reflex to ID Panel     Status: None (Preliminary result)   Collection Time: 09/14/21  6:27 AM   Specimen: BLOOD LEFT HAND  Result Value Ref Range  Status   Specimen Description BLOOD LEFT HAND  Final   Special Requests   Final    BOTTLES DRAWN AEROBIC AND ANAEROBIC Blood Culture adequate volume   Culture   Final    NO  GROWTH 2 DAYS Performed at [facility], [address], [place], [place] [address]    Report Status PENDING  Incomplete      Studies: DG CHEST PORT 1 VIEW  Result Date: 09/16/2021 CLINICAL DATA:  Respiratory failure, hypoxia EXAM: PORTABLE CHEST 1 VIEW COMPARISON:  09/12/2021 FINDINGS: Interval endotracheal and esophagogastric extubation. Right neck vascular catheter remains in position. Cardiomegaly. Interval increase in moderate left pleural effusion and new small, layering right pleural effusion. Diffuse bilateral interstitial pulmonary opacity. Underlying emphysema. IMPRESSION: 1. Interval increase in moderate left pleural effusion and new small, layering right pleural effusion. 2. Diffuse bilateral interstitial pulmonary opacity, most likely edema. 3. Cardiomegaly. Electronically Signed   By: Delanna Ahmadi M.D.   On: 09/16/2021 14:42    Scheduled Meds:  arformoterol  15 mcg Nebulization BID   atorvastatin  20 mg Oral QHS   bethanechol  5 mg Oral BID   budesonide (PULMICORT) nebulizer solution  0.25 mg Nebulization BID   Chlorhexidine Gluconate Cloth  6 each Topical Daily   dextromethorphan-guaiFENesin  1 tablet Oral BID   escitalopram  10 mg Oral Daily   feeding supplement  237 mL Oral BID BM   folic acid  1 mg Oral Daily   furosemide  40 mg Intravenous BID   insulin aspart  0-15 Units Subcutaneous Q4H   levothyroxine  200 mcg Oral Q0600   multivitamin with minerals  1 tablet Oral Daily   pantoprazole  40 mg Oral Daily   [START ON 09/17/2021] revefenacin  175 mcg Nebulization Daily   risperiDONE  0.25 mg Oral QHS   sodium chloride flush  10-40 mL Intracatheter Q12H    Continuous Infusions:  sodium chloride Stopped (09/14/21 1500)   cefTRIAXone (ROCEPHIN)  IV 2 g (09/16/21 1213)   vancomycin 1,250 mg (09/16/21 1718)     LOS: 5 days     Alma Friendly, MD [facility]  If 7PM-7AM, please contact night-coverage [email or web address] 09/16/2021, 5:48 PM

## 2021-09-16 NOTE — Progress Notes (Signed)
Pt has BIPAP at bedside. Pt will not wear BIPAP tonight due to frequent coughing and thick secretions. Pt currently receiving Mucomyst treatments and using flutter device.

## 2021-09-16 NOTE — Progress Notes (Signed)
NAME:  Terri Casey, MRN:  [id number], DOB:  October 27, 1960, LOS: 5 ADMISSION DATE:  09/11/2021, CONSULTATION DATE:  09/11/21 REFERRING MD:  Heath Lark, CHIEF COMPLAINT:  AHRF   History of Present Illness:  61 year old female active smoker with COPD who presented to ED with unresponsiveness and agonal breathing. Found on floor tipped over in wheelchair in respiratory distress. EMS transported via BVM. Failed BiPAP and then intubated in the ED. PCCM consulted for concerned for aspiration pneumonia.  Pertinent  Medical History  Anxiety Asthma dCHF COPD - no pfts on file  Diabetes mellitus without complication Hypertension not on acei Hypothyroidism PAF OSA not on CPAP  Significant Hospital Events: Including procedures, antibiotic start and stop dates in addition to other pertinent events   9/11 Transferred from [facility] to [facility]  Interim History / Subjective:   Was more obtunded and hypoxemic today.  Had an arterial blood gas drawn found to be mildly hypercapnic with pH of 7.25/90 baseline she has chronic hypercapnia  Objective   Blood pressure 117/61, pulse 83, temperature 98 F (36.7 C), temperature source Oral, resp. rate 19, height '5\' 9"'$  (1.753 m), weight 119.6 kg, SpO2 98 %.    FiO2 (%):  [100 %] 100 %   Intake/Output Summary (Last 24 hours) at 09/16/2021 1717 Last data filed at 09/16/2021 1633 Gross per 24 hour  Intake 480 ml  Output 1700 ml  Net -1220 ml   Filed Weights   09/14/21 0330 09/15/21 0350 09/16/21 0014  Weight: 115.6 kg 117.6 kg 119.6 kg    Physical Exam: General: Chronically ill-appearing obese female HENT: NCAT, tracking appropriately Respiratory: Diminished breath sounds bilaterally, absent breath sounds in the bases Cardiovascular: Regular rhythm, S1-S2 GI: Obese, soft nondistended Extremities: No significant edema Neuro: Alert oriented following commands Skin: Scattered bruising, thin skin GU: Deferred  Resolved Hospital Problem list      Assessment & Plan:   Acute on chronic hypoxemic hypercapnic respiratory failure  Recently treated for aspiration pneumonia Emphysema Acute encephalopathy secondary to sepsis - resolved  Atrial fibrillation Chronic diastolic heart failure Chest x-ray with worsening left-sided pleural effusion P: Start Pulmicort, Brovana, Yupelri, PRN albuterol nebs Stop mucomyst  Continue BiPAP nightly and with naps. Hold Xarelto We will need thoracentesis on the left likely CT chest without contrast No need for ICU admission at this time. If respiratory status worsens low threshold moving into the ICU  Staph epi bacteremia Plan: Continue antibiotics  Atrial fibrillation - currently in NSR Chronic diastolic heart failure - on xarelto   AKI 2/2 ATN, improved  Plan: Follow urine output  Fracture of 4th and 5th metatarsal  Hx fall in August P: Outpatient ortho follow up   Hypothyroidism --Continue home Synthroid  ?Hx migraines Plan: Hold Imitrex and Topamax  Chronic pain We will need to readdress home pain regimen before discharge  Depression Continue home Lexapro  Best Practice (right click and "Reselect all SmartList Selections" daily)   Diet/type: tubefeeds DVT prophylaxis: systemic heparin GI prophylaxis: PPI Lines: Central line Foley:  Yes, and it is still needed Code Status:  full code Last date of multidisciplinary goals of care discussion '[]'$  Will update family  Labs   CBC: Recent Labs  Lab 09/12/21 0308 09/12/21 0423 09/12/21 1634 09/13/21 0353 09/14/21 0400 09/15/21 0500 09/16/21 0650  WBC 14.0*  --   --  7.9 6.2 5.7 4.9  HGB 11.2*   < > 12.2 9.2* 10.4* 10.1* 9.5*  HCT 37.1   < >  36.0 29.5* 34.9* 36.2 33.3*  MCV 104.8*  --   --  101.7* 105.8* 109.4* 110.3*  PLT 154  --   --  107* 128* 136* 121*   < > = values in this interval not displayed.    Basic Metabolic Panel: Recent Labs  Lab 09/12/21 0308 09/12/21 0423 09/12/21 1441 09/12/21 1634  09/13/21 0353 09/13/21 1212 09/14/21 0400 09/15/21 0500 09/16/21 0650  NA 137   < >  --    < > 140 143 145 142 141  K 3.9   < >  --    < > 2.5* 3.1* 3.9 4.2 4.4  CL 95*  --   --   --  99 102 103 101 99  CO2 35*  --   --   --  31 33* 33* 34* 36*  GLUCOSE 96  --   --   --  95 108* 95 89 92  BUN 15  --   --   --  '20 16 11 '$ 7* 6*  CREATININE 1.05*  --   --   --  0.92 0.86 0.79 0.70 0.67  CALCIUM 8.6*  --   --   --  7.6* 8.1* 8.3* 8.4* 8.3*  MG 1.6*  --  1.9  --  1.9  --  2.3  --   --   PHOS 1.8*  --  3.6  --  3.0  --  4.2  --   --    < > = values in this interval not displayed.   GFR: Estimated Creatinine Clearance: 100.8 mL/min (by C-G formula based on SCr of 0.67 mg/dL). Recent Labs  Lab 09/11/21 0446 09/11/21 0645 09/11/21 1241 09/12/21 0308 09/13/21 0353 09/14/21 0400 09/15/21 0500 09/16/21 0650  PROCALCITON  --   --  2.37  --   --   --   --   --   WBC 14.8*  --   --    < > 7.9 6.2 5.7 4.9  LATICACIDVEN 6.0* 4.8* 1.8  --   --   --   --   --    < > = values in this interval not displayed.    Liver Function Tests: Recent Labs  Lab 09/11/21 0446  AST 22  ALT 11  ALKPHOS 131*  BILITOT 0.9  PROT 6.8  ALBUMIN 3.2*   No results for input(s): "LIPASE", "AMYLASE" in the last 168 hours. No results for input(s): "AMMONIA" in the last 168 hours.  ABG    Component Value Date/Time   PHART 7.25 (L) 09/16/2021 1419   PCO2ART 90 (HH) 09/16/2021 1419   PO2ART 89 09/16/2021 1419   HCO3 39.5 (H) 09/16/2021 1419   TCO2 34 (H) 09/12/2021 1634   ACIDBASEDEF 7.7 (H) 08/09/2019 0748   O2SAT 98.5 09/16/2021 1419     Coagulation Profile: Recent Labs  Lab 09/11/21 0446  INR 1.7*    Cardiac Enzymes: No results for input(s): "CKTOTAL", "CKMB", "CKMBINDEX", "TROPONINI" in the last 168 hours.  HbA1C: Hgb A1c MFr Bld  Date/Time Value Ref Range Status  09/11/2021 05:38 PM 4.9 4.8 - 5.6 % Final    Comment:    (NOTE) Pre diabetes:          5.7%-6.4%  Diabetes:               >6.4%  Glycemic control for   <7.0% adults with diabetes   09/11/2021 04:46 AM 4.9 4.8 - 5.6 % Final    Comment:    (  NOTE) Pre diabetes:          5.7%-6.4%  Diabetes:              >6.4%  Glycemic control for   <7.0% adults with diabetes     CBG: Recent Labs  Lab 09/16/21 0020 09/16/21 0454 09/16/21 0753 09/16/21 1202 09/16/21 1631  GLUCAP 102* 97 87 109* 99    Review of Systems:   Unable to obtain due to critical illness  Past Medical History:  She,  has a past medical history of Anxiety, Asthma, CHF (congestive heart failure) ([place]), COPD (chronic obstructive pulmonary disease) ([place]), Diabetes mellitus without complication ([place]), Hypertension, and Thyroid disease.   Surgical History:   Past Surgical History:  Procedure Laterality Date   ABDOMINAL HYSTERECTOMY       Social History:   reports that she has been smoking cigarettes. She has a 10.00 pack-year smoking history. She has never used smokeless tobacco. She reports that she does not drink alcohol and does not use drugs.   Family History:  Her family history is not on file.   Allergies Allergies  Allergen Reactions   Estrogens Hives   Mustard Seed Hives   Strawberry Extract Hives     Home Medications  Prior to Admission medications   Medication Sig Start Date End Date Taking? Authorizing Provider  albuterol (PROAIR HFA) 108 (90 Base) MCG/ACT inhaler 2 puffs every 4 hours as needed only  if your can't catch your breath 03/21/21  Yes Johnson, Clanford L, MD  albuterol (PROVENTIL) (2.5 MG/3ML) 0.083% nebulizer solution Take 3 mLs (2.5 mg total) by nebulization every 6 (six) hours as needed for wheezing or shortness of breath. 03/21/21  Yes Johnson, Clanford L, MD  amLODipine (NORVASC) 10 MG tablet Take 10 mg by mouth daily. 02/28/21  Yes [provider]  atorvastatin (LIPITOR) 20 MG tablet Take 20 mg by mouth at bedtime.    Yes [provider]  bethanechol (URECHOLINE) 5 MG tablet Take 5 mg  by mouth 2 (two) times daily. 08/06/19  Yes [provider]  cyclobenzaprine (FLEXERIL) 5 MG tablet Take 5 mg by mouth 3 (three) times daily. 02/23/19  Yes [provider]  escitalopram (LEXAPRO) 10 MG tablet Take 1 tablet by mouth daily. 05/26/20  Yes [provider]  folic acid (FOLVITE) 1 MG tablet Take 1 tablet (1 mg total) by mouth daily. 10/11/19  Yes Tat, Shanon Brow, MD  furosemide (LASIX) 40 MG tablet Take 1 tablet (40 mg total) by mouth daily. 10/11/19  Yes Tat, Shanon Brow, MD  levothyroxine (SYNTHROID) 200 MCG tablet Take 1 tablet (200 mcg total) by mouth daily at 6 (six) AM. 06/20/19  Yes Swayze, Ava, DO  liothyronine (CYTOMEL) 25 MCG tablet Take 25 mcg by mouth daily. 07/31/21  Yes [provider]  loratadine (CLARITIN) 10 MG tablet Take 10 mg by mouth daily.   Yes [provider]  metoprolol tartrate (LOPRESSOR) 25 MG tablet Take 0.5 tablets (12.5 mg total) by mouth 2 (two) times daily. 10/10/19  Yes Tat, Shanon Brow, MD  oxyCODONE-acetaminophen (PERCOCET) 10-325 MG tablet Take 1 tablet by mouth 4 (four) times daily. 09/06/21  Yes [provider]  pantoprazole (PROTONIX) 40 MG tablet Take 1 tablet (40 mg total) by mouth daily. 06/20/19  Yes Swayze, Ava, DO  SUMAtriptan (IMITREX) 50 MG tablet Take 50 mg by mouth every 2 (two) hours as needed for migraine. 07/31/21  Yes [provider]  topiramate (TOPAMAX) 100 MG tablet Take 100 mg  by mouth 2 (two) times daily. 06/01/20  Yes [provider]  XARELTO 20 MG TABS tablet Take 20 mg by mouth daily. 05/08/19  Yes [provider]  clobetasol cream (TEMOVATE) 0.05 % Apply topically. Apply as directed. Patient not taking: Reported on 09/11/2021 03/15/20   [provider]  HYDROcodone-acetaminophen (NORCO/VICODIN) 5-325 MG tablet Take 1 tablet by mouth every 6 (six) hours as needed for moderate pain. Patient not taking: Reported on 09/11/2021 09/05/21   Mordecai Rasmussen, MD  predniSONE  (DELTASONE) 20 MG tablet Take 3 PO QAM x5days, 2 PO QAM x5days, 1 PO QAM x5days Patient not taking: Reported on 09/11/2021 03/22/21   Murlean Iba, MD     This patient is critically ill with multiple organ system failure; which, requires frequent high complexity decision making, assessment, support, evaluation, and titration of therapies. This was completed through the application of advanced monitoring technologies and extensive interpretation of multiple databases. During this encounter critical care time was devoted to patient care services described in this note for 32 minutes.  [facility] 09/16/2021 5:17 PM

## 2021-09-17 ENCOUNTER — Inpatient Hospital Stay (HOSPITAL_COMMUNITY): Payer: Medicaid Other

## 2021-09-17 DIAGNOSIS — A419 Sepsis, unspecified organism: Secondary | ICD-10-CM | POA: Diagnosis not present

## 2021-09-17 DIAGNOSIS — J9601 Acute respiratory failure with hypoxia: Secondary | ICD-10-CM | POA: Diagnosis not present

## 2021-09-17 DIAGNOSIS — R0609 Other forms of dyspnea: Secondary | ICD-10-CM

## 2021-09-17 DIAGNOSIS — I5032 Chronic diastolic (congestive) heart failure: Secondary | ICD-10-CM | POA: Diagnosis not present

## 2021-09-17 DIAGNOSIS — I480 Paroxysmal atrial fibrillation: Secondary | ICD-10-CM | POA: Diagnosis not present

## 2021-09-17 LAB — BASIC METABOLIC PANEL
Anion gap: 5 (ref 5–15)
BUN: <5 mg/dL — ABNORMAL LOW (ref 8–23)
CO2: 39 mmol/L — ABNORMAL HIGH (ref 22–32)
Calcium: 8.4 mg/dL — ABNORMAL LOW (ref 8.9–10.3)
Chloride: 95 mmol/L — ABNORMAL LOW (ref 98–111)
Creatinine, Ser: 0.65 mg/dL (ref 0.44–1.00)
GFR, Estimated: >60 mL/min (ref >60)
Glucose, Bld: 84 mg/dL (ref 70–99)
Potassium: 3.7 mmol/L (ref 3.5–5.1)
Sodium: 139 mmol/L (ref 135–145)

## 2021-09-17 LAB — GLUCOSE, CAPILLARY
Glucose-Capillary: 102 mg/dL — ABNORMAL HIGH (ref 70–99)
Glucose-Capillary: 135 mg/dL — ABNORMAL HIGH (ref 70–99)
Glucose-Capillary: 71 mg/dL (ref 70–99)
Glucose-Capillary: 84 mg/dL (ref 70–99)
Glucose-Capillary: 89 mg/dL (ref 70–99)
Glucose-Capillary: 96 mg/dL (ref 70–99)
Glucose-Capillary: 99 mg/dL (ref 70–99)

## 2021-09-17 LAB — CBC WITH DIFFERENTIAL/PLATELET
Abs Immature Granulocytes: 0.06 10*3/uL (ref 0.00–0.07)
Basophils Absolute: 0 10*3/uL (ref 0.0–0.1)
Basophils Relative: 0 %
Eosinophils Absolute: 0.1 10*3/uL (ref 0.0–0.5)
Eosinophils Relative: 1 %
HCT: 29.9 % — ABNORMAL LOW (ref 36.0–46.0)
Hemoglobin: 8.8 g/dL — ABNORMAL LOW (ref 12.0–15.0)
Immature Granulocytes: 1 %
Lymphocytes Relative: 25 %
Lymphs Abs: 1.3 10*3/uL (ref 0.7–4.0)
MCH: 31.4 pg (ref 26.0–34.0)
MCHC: 29.4 g/dL — ABNORMAL LOW (ref 30.0–36.0)
MCV: 106.8 fL — ABNORMAL HIGH (ref 80.0–100.0)
Monocytes Absolute: 0.6 10*3/uL (ref 0.1–1.0)
Monocytes Relative: 11 %
Neutro Abs: 3.2 10*3/uL (ref 1.7–7.7)
Neutrophils Relative %: 62 %
Platelets: 120 10*3/uL — ABNORMAL LOW (ref 150–400)
RBC: 2.8 MIL/uL — ABNORMAL LOW (ref 3.87–5.11)
RDW: 14.8 % (ref 11.5–15.5)
WBC: 5.2 10*3/uL (ref 4.0–10.5)
nRBC: 0 % (ref 0.0–0.2)

## 2021-09-17 LAB — VANCOMYCIN, RANDOM: Vancomycin Rm: 21 ug/mL

## 2021-09-17 LAB — ECHOCARDIOGRAM COMPLETE
AR max vel: 3.67 cm2
AV Area VTI: 3.37 cm2
AV Area mean vel: 3.52 cm2
AV Mean grad: 5 mmHg
AV Peak grad: 8.2 mmHg
Ao pk vel: 1.43 m/s
Area-P 1/2: 4.57 cm2
Height: 69 in
S' Lateral: 3.2 cm
Weight: 4218.72 oz

## 2021-09-17 LAB — VANCOMYCIN, TROUGH: Vancomycin Tr: 23 ug/mL (ref 15–20)

## 2021-09-17 MED ORDER — VANCOMYCIN HCL 1500 MG/300ML IV SOLN
1500.0000 mg | INTRAVENOUS | Status: DC
  Administered 2021-09-17 – 2021-09-21 (×5): 1500 mg via INTRAVENOUS
  Filled 2021-09-17 (×6): qty 300

## 2021-09-17 NOTE — Progress Notes (Signed)
Pharmacy Antibiotic Note  Terri Casey is a 61 y.o. female admitted on 09/11/2021 with staph epi bacteremia. Pharmacy consulted to dose vancomycin. Currently on vancomycin 1250 mg q12h.   Vanc peak 36 on 9/16 0900  Vanc trough @ 1730 unable to be collected by RN. VT was redrawn on 7/17 0500 and was high at 23. VR was then obtained at 0900 with a level of 21.  Used adjusted levels for estimate of dosing regimen using peak of 36 and trough of 23. Renal function stable. WBC wnl and afebrile.    Plan: Vancomycin 1500 mg Q24 hours; Expected AUC ~493 Continue Ceftriaxone 2 grams IV Q 24 hours Obtain vancomycin peak and trough levels for goal AUC 400-550 Monitor renal function, culture results, and clinical status  Narrow abx as able and f/u with appropriate duration   Height: '5\' 9"'$  (175.3 cm) Weight: 119.6 kg (263 lb 10.7 oz) IBW/kg (Calculated) : 66.2  Temp (24hrs), Avg:98.4 F (36.9 C), Min:98 F (36.7 C), Max:98.9 F (37.2 C)  Recent Labs  Lab 09/11/21 0446 09/11/21 0645 09/11/21 1241 09/12/21 0308 09/13/21 0353 09/13/21 1212 09/14/21 0400 09/14/21 1450 09/15/21 0500 09/16/21 0650 09/16/21 0916 09/17/21 0500 09/17/21 0820  WBC 14.8*  --   --    < > 7.9  --  6.2  --  5.7 4.9  --  5.2  --   CREATININE 1.28*  --   --    < > 0.92 0.86 0.79  --  0.70 0.67  --  0.65  --   LATICACIDVEN 6.0* 4.8* 1.8  --   --   --   --   --   --   --   --   --   --   VANCOTROUGH  --   --   --   --   --   --   --   --   --   --   --  23*  --   VANCOPEAK  --   --   --   --   --   --   --   --   --   --  36  --   --   VANCORANDOM  --   --   --   --   --   --   --  17  --   --   --   --  21   < > = values in this interval not displayed.    Estimated Creatinine Clearance: 100.8 mL/min (by C-G formula based on SCr of 0.65 mg/dL).    Allergies  Allergen Reactions   Estrogens Hives   Mustard Seed Hives   Strawberry Extract Hives    Gena Fray, PharmD PGY1 Pharmacy Resident    09/17/2021 11:24 AM

## 2021-09-17 NOTE — Progress Notes (Signed)
Patient accidentally removed rectal tube with balloon inflated. Patient refusing to allow reinsertion at this time. Patient denies pain at this time

## 2021-09-17 NOTE — Progress Notes (Signed)
PROGRESS NOTE  ELESE RANE WRU:[id number] DOB: 1960-12-03 DOA: 09/11/2021 PCP: [name], NP  HPI/Recap of past 108 hours: 61 year old female active smoker with hx of COPD, diastolic HF, diabetes mellitus, hypertension, hypothyroidism, paroxysmal A-fib, OSA, presented to ED with unresponsiveness and agonal breathing. Found on floor tipped over in wheelchair in respiratory distress. Failed BiPAP and then intubated in the ED. PCCM admitted for respiratory failure.  Patient was intubated on 9/11, further stabilized extubated on 9/13.  [facility] assumed care on 9/14.    Patient's BiPAP this a.m., reported that she hated BiPAP, but denies any new complaints.   Assessment/Plan: Principal Problem:   Septic shock (HCC) Active Problems:   Acute hypercapnic respiratory failure (HCC)   Chronic diastolic CHF (congestive heart failure) (HCC)   AF (paroxysmal atrial fibrillation) (HCC)   Essential hypertension, benign   Hyperlipidemia   Hypothyroidism   PNA (pneumonia)   Acute hypoxemic respiratory failure (HCC)   Respiratory failure (HCC)   Acute encephalopathy secondary to sepsis Septic shock secondary aspiration, possible staph epidermidis bacteremia Encephalopathy resolved Currently off Levophed- BP has remained stable 1 of 3 Bcx methicillin resistant staph epidermidis. MRSA negative, repeat blood cultures pending Continue Vanco and ceftriaxone Monitor closely  Acute on chronic hypoxemic hypercapnic respiratory failure  Aspiration pneumonia COPD Active smoker Left-sided pleural effusion shown on x-ray on 09/16/2021 CT Chest with left mediastinal shift with fluid/debri in left main bronchus Intubated on 9/11, extubated on 9/13, currently now on BiPAP on 09/16/2021 Chest x-ray showed left pleural effusion with pulmonary edema PCCM reconsulted, appreciate recs Duonebs, Pulmicort, Brovana nebulizer, flutter valve, pulm hygiene CT chest without contrast showed small  bilateral pleural effusions and associated atelectasis or consolidation particularly on the right Plan for thoracentesis as per PCCM, hold Xarelto Continue abx as above Low threshold to move to ICU  Diarrhea Accidentally removed rectal tube No need to replace, monitor closely  Paroxysmal atrial fibrillation Currently in NSR Hold Xarelto as above, hold metoprolol due to soft BP Telemetry  Chronic diastolic heart failure, likely acute Hypertension Appears overloaded Chest x-ray with pulmonary edema, left-sided pleural effusion Echo showed EF of 60 to 65%, no regional wall motion abnormality, grade 1 diastolic dysfunction Plan for possible thoracentesis as above Start IV Lasix, hold amlodipine  Macrocytic Anemia  Hemoglobin currently down to 10.4 Anemia panel unremarkable Daily CBC   Fracture of 4th and 5th metatarsal  Hx fall in August Followed by Ortho outpatient. Scheduled for 9/19 Plan for consult if still inpatient NWB on forefoot   Hypothyroidism Synthroid   ?Hx migraines Family confirmed these medications are for migraines Hold imitrex and topamax for now   Chronic pain Resume home Percocet, hold flexeril   Depression Resume home lexapro  Obesity Lifestyle modification advised  Tobacco abuse Reluctant to quit  BiPAP at bedtime and nap times       Estimated body mass index is 38.94 kg/m as calculated from the following:   Height as of this encounter: '5\' 9"'$  (1.753 m).   Weight as of this encounter: 119.6 kg.     Code Status: Full  Family Communication: None at bedside  Disposition Plan: Status is: Inpatient Remains inpatient appropriate because: Level of care      Consultants: PCCM  Procedures: Mechanical ventilation  Antimicrobials: Vancomycin, ceftriaxone  DVT prophylaxis: Hold Xarelto-thoracentesis   Objective: Vitals:   09/17/21 1246 09/17/21 1247 09/17/21 1500 09/17/21 1619  BP:      Pulse:  Resp:      Temp:       TempSrc:      SpO2: (!) 88% 90% 94% 94%  Weight:      Height:        Intake/Output Summary (Last 24 hours) at 09/17/2021 1807 Last data filed at 09/17/2021 1705 Gross per 24 hour  Intake 1281.14 ml  Output 3250 ml  Net -1968.86 ml   Filed Weights   09/14/21 0330 09/15/21 0350 09/16/21 0014  Weight: 115.6 kg 117.6 kg 119.6 kg    Exam: General: Mild distress, chronically ill-appearing, appears older than stated age Cardiovascular: S1, S2 present Respiratory: Diminished breath sounds bilaterally Abdomen: Soft, nontender, nondistended, bowel sounds present Musculoskeletal: bilateral pedal edema noted Skin: Normal Psychiatry: Normal mood     Data Reviewed: CBC: Recent Labs  Lab 09/13/21 0353 09/14/21 0400 09/15/21 0500 09/16/21 0650 09/17/21 0500  WBC 7.9 6.2 5.7 4.9 5.2  NEUTROABS  --   --   --   --  3.2  HGB 9.2* 10.4* 10.1* 9.5* 8.8*  HCT 29.5* 34.9* 36.2 33.3* 29.9*  MCV 101.7* 105.8* 109.4* 110.3* 106.8*  PLT 107* 128* 136* 121* 563*   Basic Metabolic Panel: Recent Labs  Lab 09/12/21 0308 09/12/21 0423 09/12/21 1441 09/12/21 1634 09/13/21 0353 09/13/21 1212 09/14/21 0400 09/15/21 0500 09/16/21 0650 09/17/21 0500  NA 137   < >  --    < > 140 143 145 142 141 139  K 3.9   < >  --    < > 2.5* 3.1* 3.9 4.2 4.4 3.7  CL 95*  --   --   --  99 102 103 101 99 95*  CO2 35*  --   --   --  31 33* 33* 34* 36* 39*  GLUCOSE 96  --   --   --  95 108* 95 89 92 84  BUN 15  --   --   --  '20 16 11 '$ 7* 6* <5*  CREATININE 1.05*  --   --   --  0.92 0.86 0.79 0.70 0.67 0.65  CALCIUM 8.6*  --   --   --  7.6* 8.1* 8.3* 8.4* 8.3* 8.4*  MG 1.6*  --  1.9  --  1.9  --  2.3  --   --   --   PHOS 1.8*  --  3.6  --  3.0  --  4.2  --   --   --    < > = values in this interval not displayed.   GFR: Estimated Creatinine Clearance: 100.8 mL/min (by C-G formula based on SCr of 0.65 mg/dL). Liver Function Tests: Recent Labs  Lab 09/11/21 0446  AST 22  ALT 11  ALKPHOS 131*   BILITOT 0.9  PROT 6.8  ALBUMIN 3.2*   No results for input(s): "LIPASE", "AMYLASE" in the last 168 hours. No results for input(s): "AMMONIA" in the last 168 hours. Coagulation Profile: Recent Labs  Lab 09/11/21 0446  INR 1.7*   Cardiac Enzymes: No results for input(s): "CKTOTAL", "CKMB", "CKMBINDEX", "TROPONINI" in the last 168 hours. BNP (last 3 results) No results for input(s): "PROBNP" in the last 8760 hours. HbA1C: No results for input(s): "HGBA1C" in the last 72 hours.  CBG: Recent Labs  Lab 09/17/21 0616 09/17/21 0832 09/17/21 1148 09/17/21 1539 09/17/21 1700  GLUCAP 96 84 102* 135* 89   Lipid Profile: No results for input(s): "CHOL", "HDL", "LDLCALC", "TRIG", "CHOLHDL", "LDLDIRECT" in the last 72 hours.  Thyroid Function Tests: No results for input(s): "TSH", "T4TOTAL", "FREET4", "T3FREE", "THYROIDAB" in the last 72 hours.  Anemia Panel: Recent Labs    09/15/21 0500  VITAMINB12 312  FOLATE 16.6  FERRITIN 45  TIBC 272  IRON 43   Urine analysis:    Component Value Date/Time   COLORURINE AMBER (A) 09/11/2021 0526   APPEARANCEUR CLOUDY (A) 09/11/2021 0526   LABSPEC 1.016 09/11/2021 0526   PHURINE 5.0 09/11/2021 0526   GLUCOSEU 50 (A) 09/11/2021 0526   HGBUR NEGATIVE 09/11/2021 0526   BILIRUBINUR NEGATIVE 09/11/2021 0526   KETONESUR NEGATIVE 09/11/2021 0526   PROTEINUR 100 (A) 09/11/2021 0526   NITRITE NEGATIVE 09/11/2021 0526   LEUKOCYTESUR NEGATIVE 09/11/2021 0526   Sepsis Labs: '@LABRCNTIP'$ (procalcitonin:4,lacticidven:4)  ) Recent Results (from the past 240 hour(s))  Culture, blood (Routine X 2) w Reflex to ID Panel     Status: Abnormal   Collection Time: 09/11/21  5:38 AM   Specimen: Left Antecubital; Blood  Result Value Ref Range Status   Specimen Description   Final    LEFT ANTECUBITAL BOTTLES DRAWN AEROBIC AND ANAEROBIC Performed at [facility], [address] [place], [place] [address]    Special Requests   Final    Blood Culture  adequate volume Performed at [facility], [address], [place], [place] [address]    Culture  Setup Time   Final    GRAM POSITIVE COCCI ANAEROBIC AND AEROBIC BOTTLES Gram Stain Report Called to,Read Back By and Verified With: SHEPARD,B'@2330'$  BY MATTHEWS, B 9.11.2023 CRITICAL RESULT CALLED TO, READ BACK BY AND VERIFIED WITH: T RUDISILL,PHARMD'@0313'$  09/12/21 [place]    Culture (A)  Final    STAPHYLOCOCCUS EPIDERMIDIS THE SIGNIFICANCE OF ISOLATING THIS ORGANISM FROM A SINGLE SET OF BLOOD CULTURES WHEN MULTIPLE SETS ARE DRAWN IS UNCERTAIN. PLEASE NOTIFY THE MICROBIOLOGY DEPARTMENT WITHIN ONE WEEK IF SPECIATION AND SENSITIVITIES ARE REQUIRED. Performed at [facility], [place] [address], [place], [place] [address]    Report Status 09/15/2021 FINAL  Final  Blood Culture ID Panel (Reflexed)     Status: Abnormal   Collection Time: 09/11/21  5:38 AM  Result Value Ref Range Status   Enterococcus faecalis NOT DETECTED NOT DETECTED Final   Enterococcus Faecium NOT DETECTED NOT DETECTED Final   Listeria monocytogenes NOT DETECTED NOT DETECTED Final   Staphylococcus species DETECTED (A) NOT DETECTED Final    Comment: CRITICAL RESULT CALLED TO, READ BACK BY AND VERIFIED WITH: T RUDISILL,PHARMD'@0315'$  09/12/21 [place]    Staphylococcus aureus (BCID) NOT DETECTED NOT DETECTED Final   Staphylococcus epidermidis DETECTED (A) NOT DETECTED Final    Comment: Methicillin (oxacillin) resistant coagulase negative staphylococcus. Possible blood culture contaminant (unless isolated from more than one blood culture draw or clinical case suggests pathogenicity). No antibiotic treatment is indicated for blood  culture contaminants. CRITICAL RESULT CALLED TO, READ BACK BY AND VERIFIED WITH: T RUDISILL,PHARMD'@0315'$  09/12/21 [place]    Staphylococcus lugdunensis NOT DETECTED NOT DETECTED Final   Streptococcus species NOT DETECTED NOT DETECTED Final   Streptococcus agalactiae NOT DETECTED NOT DETECTED Final   Streptococcus  pneumoniae NOT DETECTED NOT DETECTED Final   Streptococcus pyogenes NOT DETECTED NOT DETECTED Final   A.calcoaceticus-baumannii NOT DETECTED NOT DETECTED Final   Bacteroides fragilis NOT DETECTED NOT DETECTED Final   Enterobacterales NOT DETECTED NOT DETECTED Final   Enterobacter cloacae complex NOT DETECTED NOT DETECTED Final   Escherichia coli NOT DETECTED NOT DETECTED Final   Klebsiella aerogenes NOT DETECTED NOT DETECTED Final   Klebsiella  oxytoca NOT DETECTED NOT DETECTED Final   Klebsiella pneumoniae NOT DETECTED NOT DETECTED Final   Proteus species NOT DETECTED NOT DETECTED Final   Salmonella species NOT DETECTED NOT DETECTED Final   Serratia marcescens NOT DETECTED NOT DETECTED Final   Haemophilus influenzae NOT DETECTED NOT DETECTED Final   Neisseria meningitidis NOT DETECTED NOT DETECTED Final   Pseudomonas aeruginosa NOT DETECTED NOT DETECTED Final   Stenotrophomonas maltophilia NOT DETECTED NOT DETECTED Final   Candida albicans NOT DETECTED NOT DETECTED Final   Candida auris NOT DETECTED NOT DETECTED Final   Candida glabrata NOT DETECTED NOT DETECTED Final   Candida krusei NOT DETECTED NOT DETECTED Final   Candida parapsilosis NOT DETECTED NOT DETECTED Final   Candida tropicalis NOT DETECTED NOT DETECTED Final   Cryptococcus neoformans/gattii NOT DETECTED NOT DETECTED Final   Methicillin resistance mecA/C DETECTED (A) NOT DETECTED Final    Comment: CRITICAL RESULT CALLED TO, READ BACK BY AND VERIFIED WITH: T RUDISILL,PHARMD'@0315'$  09/12/21 [place] Performed at [facility], [place] [address] [place], [place] [address]   Culture, blood (Routine X 2) w Reflex to ID Panel     Status: Abnormal   Collection Time: 09/11/21  5:44 AM   Specimen: Right Antecubital; Blood  Result Value Ref Range Status   Specimen Description   Final    RIGHT ANTECUBITAL BOTTLES DRAWN AEROBIC AND ANAEROBIC Performed at [facility], [address] [place], [place] [address]    Special Requests    Final    Blood Culture adequate volume Performed at [facility], [address], [place], [place] [address]    Culture  Setup Time   Final    GRAM POSITIVE COCCI AEROBIC BOTTLE ONLY Gram Stain Report Called to,Read Back By and Verified With: [facility] M. AT [place] AT 0254 ON [id number] BY THOMPSON S. CRITICAL VALUE NOTED.  VALUE IS CONSISTENT WITH PREVIOUSLY REPORTED AND CALLED VALUE.    Culture (A)  Final    STAPHYLOCOCCUS EPIDERMIDIS SUSCEPTIBILITIES PERFORMED ON PREVIOUS CULTURE WITHIN THE LAST 5 DAYS. Performed at [facility], [place] [address], [place], [place] [address]    Report Status 09/14/2021 FINAL  Final  MRSA Next Gen by PCR, Nasal     Status: None   Collection Time: 09/11/21  8:00 PM   Specimen: Nasal Mucosa; Nasal Swab  Result Value Ref Range Status   MRSA by PCR Next Gen NOT DETECTED NOT DETECTED Final    Comment: (NOTE) The GeneXpert MRSA Assay (FDA approved for NASAL specimens only), is one component of a comprehensive MRSA colonization surveillance program. It is not intended to diagnose MRSA infection nor to guide or monitor treatment for MRSA infections. Test performance is not FDA approved in patients less than 39 years old. Performed at [facility], [place] [address] [place], [place] [address]   Culture, blood (Routine X 2) w Reflex to ID Panel     Status: None (Preliminary result)   Collection Time: 09/14/21  6:18 AM   Specimen: BLOOD LEFT HAND  Result Value Ref Range Status   Specimen Description BLOOD LEFT HAND  Final   Special Requests   Final    BOTTLES DRAWN AEROBIC AND ANAEROBIC Blood Culture adequate volume   Culture   Final    NO GROWTH 3 DAYS Performed at [facility], [place] [address] [place], [place] [address]    Report Status PENDING  Incomplete  Culture, blood (Routine X 2) w Reflex to ID Panel  Status: None (Preliminary result)   Collection Time: 09/14/21  6:27 AM   Specimen: BLOOD LEFT HAND  Result Value Ref Range Status    Specimen Description BLOOD LEFT HAND  Final   Special Requests   Final    BOTTLES DRAWN AEROBIC AND ANAEROBIC Blood Culture adequate volume   Culture   Final    NO GROWTH 3 DAYS Performed at [facility], [address], [place], [place] [address]    Report Status PENDING  Incomplete      Studies: CT CHEST WO CONTRAST  Result Date: 09/17/2021 CLINICAL DATA:  Pleural effusion, shortness of breath EXAM: CT CHEST WITHOUT CONTRAST TECHNIQUE: Multidetector CT imaging of the chest was performed following the standard protocol without IV contrast. RADIATION DOSE REDUCTION: This exam was performed according to the departmental dose-optimization program which includes automated exposure control, adjustment of the mA and/or kV according to patient size and/or use of iterative reconstruction technique. COMPARISON:  09/11/2021 FINDINGS: Cardiovascular: Aortic atherosclerosis. Normal heart size. Enlargement of the main pulmonary artery measuring up to 3.9 cm in caliber. Trace pericardial effusion similar to prior. Mediastinum/Nodes: Unchanged prominent mediastinal lymph nodes. Previously reported left hilar lymph nodes are poorly assessed on this noncontrast examination. Thyroid gland, trachea, and esophagus demonstrate no significant findings. Lungs/Pleura: Moderate centrilobular emphysema. Diffuse bilateral bronchial wall thickening. Small bilateral pleural effusions and associated atelectasis or consolidation, increased compared to prior examination, particularly on the right. Previously seen left mainstem bronchial occlusion is resolved (series 5, image 71). Unchanged bandlike scarring and volume loss of the posterior lingula, right middle lobe, and right lung base (series 5, image 106). Unchanged subpleural nodule of the peripheral right upper lobe measuring 0.7 x 0.4 cm (series 5, image 41) Upper Abdomen: No acute abnormality. Musculoskeletal: No chest wall abnormality. No acute osseous findings.  IMPRESSION: 1. Small bilateral pleural effusions and associated atelectasis or consolidation, increased compared to prior examination, particularly on the right. 2. Previously seen left mainstem bronchial debris and occlusion is resolved. 3. Unchanged underlying bandlike scarring and volume loss of the posterior lingula, right middle lobe, and right lung base. 4. Emphysema and diffuse bilateral bronchial wall thickening. 5. Enlargement of the main pulmonary artery, as can be seen in pulmonary hypertension. 6. Unchanged subpleural nodule of the peripheral right upper lobe measuring 0.7 cm. As previously reported, non-contrast chest CT at 6-12 months is recommended. If the nodule is stable at time of repeat CT, then future CT at 18-24 months (from today's scan) is considered optional for low-risk patients, but is recommended for high-risk patients. This recommendation follows the consensus statement: Guidelines for Management of Incidental Pulmonary Nodules Detected on CT Images: From the Fleischner Society 2017; Radiology 2017; 284:228-243. Aortic Atherosclerosis (ICD10-I70.0) and Emphysema (ICD10-J43.9). Electronically Signed   By: Delanna Ahmadi M.D.   On: 09/17/2021 16:57   ECHOCARDIOGRAM COMPLETE  Result Date: 09/17/2021    ECHOCARDIOGRAM REPORT   Patient Name:   MARYCLAIRE STOECKER Date of Exam: 09/17/2021 Medical Rec #:  [id number]         Height:       69.0 in Accession #:    [id number]        Weight:       263.7 lb Date of Birth:  [birth year]        BSA:          2.324 m Patient Age:    44 years          BP:  130/84 mmHg Patient Gender: F                 HR:           98 bpm. Exam Location:  Inpatient Procedure: 2D Echo, Cardiac Doppler, Color Doppler and 3D Echo Indications:    Dyspnea  History:        Patient has prior history of Echocardiogram examinations, most                 recent 06/09/2019. COPD; Risk Factors:Hypertension, Diabetes,                 Dyslipidemia and Current Smoker.  Sonographer:     Merrie Roof RDCS Referring Phys: Adline Peals [facility]  Sonographer Comments: Technically difficult study due to poor echo windows. IMPRESSIONS  1. Left ventricular ejection fraction, by estimation, is 60 to 65%. Left ventricular ejection fraction by 3D volume is 62 %. The left ventricle has normal function. The left ventricle has no regional wall motion abnormalities. There is mild left ventricular hypertrophy. Left ventricular diastolic parameters are consistent with Grade I diastolic dysfunction (impaired relaxation).  2. Right ventricular systolic function is mildly reduced. The right ventricular size is normal. Tricuspid regurgitation signal is inadequate for assessing PA pressure.  3. The mitral valve is grossly normal. No evidence of mitral valve regurgitation.  4. The aortic valve was not well visualized. Aortic valve regurgitation is not visualized.  5. The inferior vena cava is dilated in size with >50% respiratory variability, suggesting right atrial pressure of 8 mmHg. Comparison(s): Changes from prior study are noted. 06/09/2019: LVEF 50-55%, severe RV systolic dysfunction. FINDINGS  Left Ventricle: Left ventricular ejection fraction, by estimation, is 60 to 65%. Left ventricular ejection fraction by 3D volume is 62 %. The left ventricle has normal function. The left ventricle has no regional wall motion abnormalities. The left ventricular internal cavity size was normal in size. There is mild left ventricular hypertrophy. Left ventricular diastolic parameters are consistent with Grade I diastolic dysfunction (impaired relaxation). Indeterminate filling pressures. Right Ventricle: The right ventricular size is normal. No increase in right ventricular wall thickness. Right ventricular systolic function is mildly reduced. Tricuspid regurgitation signal is inadequate for assessing PA pressure. Left Atrium: Left atrial size was normal in size. Right Atrium: Right atrial size was normal in size. Pericardium:  There is no evidence of pericardial effusion. Mitral Valve: The mitral valve is grossly normal. No evidence of mitral valve regurgitation. Tricuspid Valve: The tricuspid valve is not well visualized. Tricuspid valve regurgitation is not demonstrated. Aortic Valve: The aortic valve was not well visualized. Aortic valve regurgitation is not visualized. Aortic valve mean gradient measures 5.0 mmHg. Aortic valve peak gradient measures 8.2 mmHg. Aortic valve area, by VTI measures 3.37 cm. Pulmonic Valve: The pulmonic valve was normal in structure. Pulmonic valve regurgitation is not visualized. Aorta: The aortic root and ascending aorta are structurally normal, with no evidence of dilitation. Venous: The inferior vena cava is dilated in size with greater than 50% respiratory variability, suggesting right atrial pressure of 8 mmHg. IAS/Shunts: No atrial level shunt detected by color flow Doppler.  LEFT VENTRICLE PLAX 2D LVIDd:         5.70 cm         Diastology LVIDs:         3.20 cm         LV e' medial:    9.79 cm/s LV PW:  1.30 cm         LV E/e' medial:  6.4 LV IVS:        1.20 cm         LV e' lateral:   7.29 cm/s LVOT diam:     2.20 cm         LV E/e' lateral: 8.6 LV SV:         81 LV SV Index:   35 LVOT Area:     3.80 cm        3D Volume EF                                LV 3D EF:    Left                                             ventricul                                             ar                                             ejection                                             fraction                                             by 3D                                             volume is                                             62 %.                                 3D Volume EF:                                3D EF:        62 %                                LV EDV:       171 ml  LV ESV:       66 ml                                LV SV:        105 ml RIGHT VENTRICLE              IVC RV Basal diam:  3.80 cm     IVC diam: 2.10 cm RV S prime:     10.80 cm/s TAPSE (M-mode): 2.2 cm LEFT ATRIUM             Index        RIGHT ATRIUM           Index LA diam:        3.60 cm 1.55 cm/m   RA Area:     16.10 cm LA Vol (A2C):   53.1 ml 22.85 ml/m  RA Volume:   38.80 ml  16.70 ml/m LA Vol (A4C):   90.5 ml 38.95 ml/m LA Biplane Vol: 74.0 ml 31.85 ml/m  AORTIC VALVE AV Area (Vmax):    3.67 cm AV Area (Vmean):   3.52 cm AV Area (VTI):     3.37 cm AV Vmax:           143.00 cm/s AV Vmean:          101.000 cm/s AV VTI:            0.239 m AV Peak Grad:      8.2 mmHg AV Mean Grad:      5.0 mmHg LVOT Vmax:         138.00 cm/s LVOT Vmean:        93.600 cm/s LVOT VTI:          0.212 m LVOT/AV VTI ratio: 0.89  AORTA Ao Root diam: 3.30 cm MITRAL VALVE MV Area (PHT): 4.57 cm    SHUNTS MV Decel Time: 166 msec    Systemic VTI:  0.21 m MV E velocity: 62.90 cm/s  Systemic Diam: 2.20 cm MV A velocity: 95.30 cm/s MV E/A ratio:  0.66 Lyman Bishop MD Electronically signed by Lyman Bishop MD Signature Date/Time: 09/17/2021/11:54:28 AM    Final     Scheduled Meds:  arformoterol  15 mcg Nebulization BID   atorvastatin  20 mg Oral QHS   bethanechol  5 mg Oral BID   budesonide (PULMICORT) nebulizer solution  0.25 mg Nebulization BID   Chlorhexidine Gluconate Cloth  6 each Topical Daily   dextromethorphan-guaiFENesin  1 tablet Oral BID   escitalopram  10 mg Oral Daily   feeding supplement  237 mL Oral BID BM   folic acid  1 mg Oral Daily   furosemide  40 mg Intravenous BID   insulin aspart  0-15 Units Subcutaneous Q4H   levothyroxine  200 mcg Oral Q0600   multivitamin with minerals  1 tablet Oral Daily   pantoprazole  40 mg Oral Daily   revefenacin  175 mcg Nebulization Daily   risperiDONE  0.25 mg Oral QHS   sodium chloride flush  10-40 mL Intracatheter Q12H    Continuous Infusions:  sodium chloride Stopped (09/14/21 1500)   cefTRIAXone (ROCEPHIN)  IV Stopped (09/17/21 1158)    vancomycin Stopped (09/17/21 1440)     LOS: 6 days     Alma Friendly, MD [facility]  If 7PM-7AM, please contact night-coverage [email or web address] 09/17/2021, 6:07 PM

## 2021-09-17 NOTE — Progress Notes (Signed)
  Echocardiogram 2D Echocardiogram has been performed.  Merrie Roof F 09/17/2021, 9:47 AM

## 2021-09-17 NOTE — Progress Notes (Signed)
   09/17/21 0601  Provider Notification  Provider Name/Title Reece Levy, MD  Date Provider Notified 09/17/21  Time Provider Notified [phone]  Method of Notification Page  Notification Reason Critical result  Test performed and critical result Vanco Trough=23  Date Critical Result Received 09/17/21  Time Critical Result Received 0553  Provider response Other (Comment) (Please notify Pharmacy(pharmacy notified))  Date of Provider Response 09/17/21  Time of Provider Response [phone]

## 2021-09-18 ENCOUNTER — Inpatient Hospital Stay (HOSPITAL_COMMUNITY): Payer: Medicaid Other

## 2021-09-18 ENCOUNTER — Encounter (HOSPITAL_COMMUNITY): Payer: Self-pay | Admitting: Pulmonary Disease

## 2021-09-18 ENCOUNTER — Encounter (HOSPITAL_COMMUNITY)
Admission: EM | Disposition: A | Discharge status: Home Health | Payer: Self-pay | Source: Skilled Nursing Facility | Attending: Internal Medicine

## 2021-09-18 DIAGNOSIS — I480 Paroxysmal atrial fibrillation: Secondary | ICD-10-CM | POA: Diagnosis not present

## 2021-09-18 DIAGNOSIS — R6521 Severe sepsis with septic shock: Secondary | ICD-10-CM | POA: Diagnosis not present

## 2021-09-18 DIAGNOSIS — A419 Sepsis, unspecified organism: Secondary | ICD-10-CM | POA: Diagnosis not present

## 2021-09-18 DIAGNOSIS — I5032 Chronic diastolic (congestive) heart failure: Secondary | ICD-10-CM | POA: Diagnosis not present

## 2021-09-18 DIAGNOSIS — J9601 Acute respiratory failure with hypoxia: Secondary | ICD-10-CM | POA: Diagnosis not present

## 2021-09-18 HISTORY — PX: THORACENTESIS: SHX235

## 2021-09-18 LAB — BASIC METABOLIC PANEL
Anion gap: 6 (ref 5–15)
BUN: <5 mg/dL — ABNORMAL LOW (ref 8–23)
CO2: 44 mmol/L — ABNORMAL HIGH (ref 22–32)
Calcium: 8.6 mg/dL — ABNORMAL LOW (ref 8.9–10.3)
Chloride: 92 mmol/L — ABNORMAL LOW (ref 98–111)
Creatinine, Ser: 0.6 mg/dL (ref 0.44–1.00)
GFR, Estimated: >60 mL/min (ref >60)
Glucose, Bld: 86 mg/dL (ref 70–99)
Potassium: 3.2 mmol/L — ABNORMAL LOW (ref 3.5–5.1)
Sodium: 142 mmol/L (ref 135–145)

## 2021-09-18 LAB — CBC WITH DIFFERENTIAL/PLATELET
Abs Immature Granulocytes: 0.06 10*3/uL (ref 0.00–0.07)
Basophils Absolute: 0 10*3/uL (ref 0.0–0.1)
Basophils Relative: 0 %
Eosinophils Absolute: 0.1 10*3/uL (ref 0.0–0.5)
Eosinophils Relative: 2 %
HCT: 30.4 % — ABNORMAL LOW (ref 36.0–46.0)
Hemoglobin: 8.9 g/dL — ABNORMAL LOW (ref 12.0–15.0)
Immature Granulocytes: 1 %
Lymphocytes Relative: 31 %
Lymphs Abs: 1.6 10*3/uL (ref 0.7–4.0)
MCH: 31 pg (ref 26.0–34.0)
MCHC: 29.3 g/dL — ABNORMAL LOW (ref 30.0–36.0)
MCV: 105.9 fL — ABNORMAL HIGH (ref 80.0–100.0)
Monocytes Absolute: 0.6 10*3/uL (ref 0.1–1.0)
Monocytes Relative: 12 %
Neutro Abs: 2.7 10*3/uL (ref 1.7–7.7)
Neutrophils Relative %: 54 %
Platelets: 143 10*3/uL — ABNORMAL LOW (ref 150–400)
RBC: 2.87 MIL/uL — ABNORMAL LOW (ref 3.87–5.11)
RDW: 14.9 % (ref 11.5–15.5)
WBC: 5 10*3/uL (ref 4.0–10.5)
nRBC: 0 % (ref 0.0–0.2)

## 2021-09-18 LAB — ALBUMIN, PLEURAL OR PERITONEAL FLUID: Albumin, Fluid: <1.5 g/dL

## 2021-09-18 LAB — MAGNESIUM: Magnesium: 1.9 mg/dL (ref 1.7–2.4)

## 2021-09-18 LAB — PROTEIN, PLEURAL OR PERITONEAL FLUID: Total protein, fluid: <3 g/dL

## 2021-09-18 LAB — BODY FLUID CELL COUNT WITH DIFFERENTIAL
Eos, Fluid: 0 %
Lymphs, Fluid: 32 %
Monocyte-Macrophage-Serous Fluid: 14 % — ABNORMAL LOW (ref 50–90)
Neutrophil Count, Fluid: 54 % — ABNORMAL HIGH (ref 0–25)
Total Nucleated Cell Count, Fluid: 192 cu mm (ref 0–1000)

## 2021-09-18 LAB — GLUCOSE, CAPILLARY
Glucose-Capillary: 105 mg/dL — ABNORMAL HIGH (ref 70–99)
Glucose-Capillary: 125 mg/dL — ABNORMAL HIGH (ref 70–99)
Glucose-Capillary: 141 mg/dL — ABNORMAL HIGH (ref 70–99)
Glucose-Capillary: 87 mg/dL (ref 70–99)
Glucose-Capillary: 87 mg/dL (ref 70–99)
Glucose-Capillary: 92 mg/dL (ref 70–99)

## 2021-09-18 LAB — LACTATE DEHYDROGENASE, PLEURAL OR PERITONEAL FLUID: LD, Fluid: 110 U/L — ABNORMAL HIGH (ref 3–23)

## 2021-09-18 LAB — GLUCOSE, PLEURAL OR PERITONEAL FLUID: Glucose, Fluid: 97 mg/dL

## 2021-09-18 SURGERY — THORACENTESIS
Anesthesia: LOCAL | Laterality: Left

## 2021-09-18 MED ORDER — POTASSIUM CHLORIDE CRYS ER 20 MEQ PO TBCR
40.0000 meq | EXTENDED_RELEASE_TABLET | Freq: Two times a day (BID) | ORAL | Status: AC
  Administered 2021-09-18 (×2): 40 meq via ORAL
  Filled 2021-09-18 (×2): qty 2

## 2021-09-18 NOTE — Progress Notes (Signed)
   NAME:  Terri Casey, MRN:  [id number], DOB:  1960-07-11, LOS: 7 ADMISSION DATE:  09/11/2021, CONSULTATION DATE:  9/18 REFERRING MD:  Manuella Ghazi, CHIEF COMPLAINT:  Dyspnea   History of Present Illness:  61 y/o female with admitted for acute respiratory failure with hypercarbia in setting of severe COPD and aspiration pneumonia.  Required intubation.  PCCM has been following for persistent hypoxemic respiratory failure.    Pertinent  Medical History  Anxiety Asthma dCHF COPD - no pfts on file  Diabetes mellitus without complication Hypertension not on acei Hypothyroidism PAF OSA not on CPAP  Significant Hospital Events: Including procedures, antibiotic start and stop dates in addition to other pertinent events   9/11 transferred from [facility] to [facility], intubated, on vasopressors; blood cultures positive for staph epi 3/4 bottles 9/13 extubated 9/14 blood cultures ngtd  Interim History / Subjective:  Feels about the same this morning Says breathing is largely unchanged Urinating a lot from lasix, she says it's not helping her breathing She hasn't been wearing BIPAP    Objective   Blood pressure (!) 147/77, pulse 91, temperature 98.8 F (37.1 C), temperature source Oral, resp. rate 14, height '5\' 9"'$  (1.753 m), weight 114.1 kg, SpO2 96 %.    FiO2 (%):  [50 %] 50 %   Intake/Output Summary (Last 24 hours) at 09/18/2021 0924 Last data filed at 09/18/2021 0013 Gross per 24 hour  Intake 1070 ml  Output 3050 ml  Net -1980 ml   Filed Weights   09/15/21 0350 09/16/21 0014 09/18/21 0010  Weight: 117.6 kg 119.6 kg 114.1 kg    Examination:  General:  chronically ill appearing, resting comfortably in bed HENT: NCAT OP clear PULM: Crackles bases, wheezing bases, normal effort CV: RRR, no mgr GI: BS+, soft, nontender MSK: normal bulk and tone Neuro: awake, alert, no distress, MAEW  Resolved Hospital Problem list     Assessment & Plan:  Acute respiratory failure with  hypoxemia Aspiration pneumonia Staph epi bacteremia COPD, presumed AKI, ATN Centrilobular emphysema Hypothyroidism Chronic respiratory failure with hypercapnia>  Chronic respiratory failure with hypoxemia on 6L O2  Discussion: She has persistent hypoxemia which is likely multifactorial from pneumonia with underlying emphysema.  I agree that we need to assess the pleural fluid given her ongoing tobacco use and recent infection.  Will plan for thoracentesis today. Suspect the overall situation will take weeks to recovery.  Plan: Contiue brovana, pulmicort, yupelri Continue vancomycin, ceftriaxone Plan for thoracentesis today, left side Can resume Xarelto after thora Wean off O2 for O2 saturation > 88% Out of bed, ambulate BIPAP qHS  Best Practice (right click and "Reselect all SmartList Selections" daily)   Per [facility]   Roselie Awkward, MD [place] [facility] Pager: [phone] Cell: [phone] After 7:00 pm call Elink  [phone]

## 2021-09-18 NOTE — Progress Notes (Signed)
PROGRESS NOTE  Terri Casey:[id number] DOB: December 30, 1960 DOA: 09/11/2021 PCP: [name], NP  HPI/Recap of past 66 hours: 61 year old female active smoker with hx of COPD, diastolic HF, diabetes mellitus, hypertension, hypothyroidism, paroxysmal A-fib, OSA, presented to ED with unresponsiveness and agonal breathing. Found on floor tipped over in wheelchair in respiratory distress. Failed BiPAP and then intubated in the ED. PCCM admitted for respiratory failure.  Patient was intubated on 9/11, further stabilized extubated on 9/13.  [facility] assumed care on 9/14.    Today, patient denies any new complaints, noted some very mild intermittent confusion (//has been told she has underlying mild cognitive deficit)   Assessment/Plan: Principal Problem:   Septic shock (HCC) Active Problems:   Acute hypercapnic respiratory failure (HCC)   Chronic diastolic CHF (congestive heart failure) (HCC)   AF (paroxysmal atrial fibrillation) (HCC)   Essential hypertension, benign   Hyperlipidemia   Hypothyroidism   PNA (pneumonia)   Acute hypoxemic respiratory failure (HCC)   Respiratory failure (HCC)   Acute encephalopathy secondary to sepsis Septic shock secondary aspiration, possible staph epidermidis bacteremia Encephalopathy resolved Currently off Levophed- BP has remained stable 1 of 3 Bcx methicillin resistant staph epidermidis. MRSA negative, repeat blood cultures NGTD Continue Vanco and ceftriaxone Monitor closely  Acute on chronic hypoxemic hypercapnic respiratory failure  Aspiration pneumonia COPD Active smoker Left-sided pleural effusion shown on x-ray on 09/16/2021 s/p thoracentesis by PCCM on 09/18/2021 CT Chest with left mediastinal shift with fluid/debri in left main bronchus Intubated on 9/11, extubated on 9/13, currently now on BiPAP at night Chest x-ray showed left pleural effusion with pulmonary edema PCCM reconsulted, appreciate recs Duonebs,  Pulmicort, Brovana nebulizer, flutter valve, pulm hygiene CT chest without contrast showed small bilateral pleural effusions and associated atelectasis or consolidation particularly on the right Body fluids analysis pending Continue abx as above  Diarrhea Improved Accidentally removed rectal tube No need to replace, monitor closely  Paroxysmal atrial fibrillation Currently in NSR Restart Xarelto, hold metoprolol due to soft BP Telemetry  Chronic diastolic heart failure, likely acute Hypertension Chest x-ray with pulmonary edema, left-sided pleural effusion Echo showed EF of 60 to 65%, no regional wall motion abnormality, grade 1 diastolic dysfunction S/p thoracentesis as above Continue IV Lasix, hold amlodipine  Hypokalemia Replace as needed  Macrocytic Anemia  Hemoglobin currently down to 10.4 Anemia panel unremarkable Daily CBC   Fracture of 4th and 5th metatarsal  Hx fall in August Followed by Ortho outpatient. Scheduled for 9/19 Plan for consult if still inpatient NWB on forefoot   Hypothyroidism Synthroid   ?Hx migraines Family confirmed these medications are for migraines Hold imitrex and topamax for now   Chronic pain Resume home Percocet, hold flexeril   Depression Resume home lexapro  Obesity Lifestyle modification advised  Tobacco abuse Reluctant to quit  BiPAP at bedtime and nap times       Estimated body mass index is 37.15 kg/m as calculated from the following:   Height as of this encounter: '5\' 9"'$  (1.753 m).   Weight as of this encounter: 114.1 kg.     Code Status: Full  Family Communication: None at bedside  Disposition Plan: Status is: Inpatient Remains inpatient appropriate because: Level of care      Consultants: PCCM  Procedures: Mechanical ventilation  Antimicrobials: Vancomycin, ceftriaxone  DVT prophylaxis: Xarelto   Objective: Vitals:   09/18/21 1200 09/18/21 1215 09/18/21 1302 09/18/21 1430  BP:  121/61 121/61 117/61 114/65  Pulse: 100 (!)  103 100 92  Resp: '14 15 12 13  '$ Temp:   98.3 F (36.8 C)   TempSrc:   Temporal   SpO2: 99% 95% 93% 100%  Weight:      Height:        Intake/Output Summary (Last 24 hours) at 09/18/2021 1557 Last data filed at 09/18/2021 0013 Gross per 24 hour  Intake 370 ml  Output 1650 ml  Net -1280 ml   Filed Weights   09/15/21 0350 09/16/21 0014 09/18/21 0010  Weight: 117.6 kg 119.6 kg 114.1 kg    Exam: General: NAD, chronically ill-appearing, appears older than stated age Cardiovascular: S1, S2 present Respiratory: Diminished breath sounds bilaterally Abdomen: Soft, nontender, nondistended, bowel sounds present Musculoskeletal: bilateral pedal edema noted Skin: Normal Psychiatry: Normal mood     Data Reviewed: CBC: Recent Labs  Lab 09/14/21 0400 09/15/21 0500 09/16/21 0650 09/17/21 0500 09/18/21 0425  WBC 6.2 5.7 4.9 5.2 5.0  NEUTROABS  --   --   --  3.2 2.7  HGB 10.4* 10.1* 9.5* 8.8* 8.9*  HCT 34.9* 36.2 33.3* 29.9* 30.4*  MCV 105.8* 109.4* 110.3* 106.8* 105.9*  PLT 128* 136* 121* 120* 818*   Basic Metabolic Panel: Recent Labs  Lab 09/12/21 0308 09/12/21 0423 09/12/21 1441 09/12/21 1634 09/13/21 0353 09/13/21 1212 09/14/21 0400 09/15/21 0500 09/16/21 0650 09/17/21 0500 09/18/21 0425  NA 137   < >  --    < > 140   < > 145 142 141 139 142  K 3.9   < >  --    < > 2.5*   < > 3.9 4.2 4.4 3.7 3.2*  CL 95*  --   --   --  99   < > 103 101 99 95* 92*  CO2 35*  --   --   --  31   < > 33* 34* 36* 39* 44*  GLUCOSE 96  --   --   --  95   < > 95 89 92 84 86  BUN 15  --   --   --  20   < > 11 7* 6* <5* <5*  CREATININE 1.05*  --   --   --  0.92   < > 0.79 0.70 0.67 0.65 0.60  CALCIUM 8.6*  --   --   --  7.6*   < > 8.3* 8.4* 8.3* 8.4* 8.6*  MG 1.6*  --  1.9  --  1.9  --  2.3  --   --   --  1.9  PHOS 1.8*  --  3.6  --  3.0  --  4.2  --   --   --   --    < > = values in this interval not displayed.   GFR: Estimated Creatinine  Clearance: 98.3 mL/min (by C-G formula based on SCr of 0.6 mg/dL). Liver Function Tests: No results for input(s): "AST", "ALT", "ALKPHOS", "BILITOT", "PROT", "ALBUMIN" in the last 168 hours.  No results for input(s): "LIPASE", "AMYLASE" in the last 168 hours. No results for input(s): "AMMONIA" in the last 168 hours. Coagulation Profile: No results for input(s): "INR", "PROTIME" in the last 168 hours.  Cardiac Enzymes: No results for input(s): "CKTOTAL", "CKMB", "CKMBINDEX", "TROPONINI" in the last 168 hours. BNP (last 3 results) No results for input(s): "PROBNP" in the last 8760 hours. HbA1C: No results for input(s): "HGBA1C" in the last 72 hours.  CBG: Recent Labs  Lab 09/18/21 0021 09/18/21 0408 09/18/21  7425 09/18/21 1146 09/18/21 1556  GLUCAP 92 87 87 105* 141*   Lipid Profile: No results for input(s): "CHOL", "HDL", "LDLCALC", "TRIG", "CHOLHDL", "LDLDIRECT" in the last 72 hours.  Thyroid Function Tests: No results for input(s): "TSH", "T4TOTAL", "FREET4", "T3FREE", "THYROIDAB" in the last 72 hours.  Anemia Panel: No results for input(s): "VITAMINB12", "FOLATE", "FERRITIN", "TIBC", "IRON", "RETICCTPCT" in the last 72 hours.  Urine analysis:    Component Value Date/Time   COLORURINE AMBER (A) 09/11/2021 0526   APPEARANCEUR CLOUDY (A) 09/11/2021 0526   LABSPEC 1.016 09/11/2021 0526   PHURINE 5.0 09/11/2021 0526   GLUCOSEU 50 (A) 09/11/2021 0526   HGBUR NEGATIVE 09/11/2021 0526   BILIRUBINUR NEGATIVE 09/11/2021 0526   KETONESUR NEGATIVE 09/11/2021 0526   PROTEINUR 100 (A) 09/11/2021 0526   NITRITE NEGATIVE 09/11/2021 0526   LEUKOCYTESUR NEGATIVE 09/11/2021 0526   Sepsis Labs: '@LABRCNTIP'$ (procalcitonin:4,lacticidven:4)  ) Recent Results (from the past 240 hour(s))  Culture, blood (Routine X 2) w Reflex to ID Panel     Status: Abnormal   Collection Time: 09/11/21  5:38 AM   Specimen: Left Antecubital; Blood  Result Value Ref Range Status   Specimen Description    Final    LEFT ANTECUBITAL BOTTLES DRAWN AEROBIC AND ANAEROBIC Performed at [facility], [address] [place], [place] [address]    Special Requests   Final    Blood Culture adequate volume Performed at [facility], [address], [place], [place] [address]    Culture  Setup Time   Final    GRAM POSITIVE COCCI ANAEROBIC AND AEROBIC BOTTLES Gram Stain Report Called to,Read Back By and Verified With: SHEPARD,B'@2330'$  BY MATTHEWS, B 9.11.2023 CRITICAL RESULT CALLED TO, READ BACK BY AND VERIFIED WITH: T RUDISILL,PHARMD'@0313'$  09/12/21 [place]    Culture (A)  Final    STAPHYLOCOCCUS EPIDERMIDIS THE SIGNIFICANCE OF ISOLATING THIS ORGANISM FROM A SINGLE SET OF BLOOD CULTURES WHEN MULTIPLE SETS ARE DRAWN IS UNCERTAIN. PLEASE NOTIFY THE MICROBIOLOGY DEPARTMENT WITHIN ONE WEEK IF SPECIATION AND SENSITIVITIES ARE REQUIRED. Performed at [facility], [place] [address] [place], [place] [address]    Report Status 09/15/2021 FINAL  Final  Blood Culture ID Panel (Reflexed)     Status: Abnormal   Collection Time: 09/11/21  5:38 AM  Result Value Ref Range Status   Enterococcus faecalis NOT DETECTED NOT DETECTED Final   Enterococcus Faecium NOT DETECTED NOT DETECTED Final   Listeria monocytogenes NOT DETECTED NOT DETECTED Final   Staphylococcus species DETECTED (A) NOT DETECTED Final    Comment: CRITICAL RESULT CALLED TO, READ BACK BY AND VERIFIED WITH: T RUDISILL,PHARMD'@0315'$  09/12/21 [place]    Staphylococcus aureus (BCID) NOT DETECTED NOT DETECTED Final   Staphylococcus epidermidis DETECTED (A) NOT DETECTED Final    Comment: Methicillin (oxacillin) resistant coagulase negative staphylococcus. Possible blood culture contaminant (unless isolated from more than one blood culture draw or clinical case suggests pathogenicity). No antibiotic treatment is indicated for blood  culture contaminants. CRITICAL RESULT CALLED TO, READ BACK BY AND VERIFIED WITH: T RUDISILL,PHARMD'@0315'$  09/12/21 [place]    Staphylococcus  lugdunensis NOT DETECTED NOT DETECTED Final   Streptococcus species NOT DETECTED NOT DETECTED Final   Streptococcus agalactiae NOT DETECTED NOT DETECTED Final   Streptococcus pneumoniae NOT DETECTED NOT DETECTED Final   Streptococcus pyogenes NOT DETECTED NOT DETECTED Final   A.calcoaceticus-baumannii NOT DETECTED NOT DETECTED Final   Bacteroides fragilis NOT DETECTED NOT DETECTED Final   Enterobacterales NOT DETECTED NOT DETECTED Final   Enterobacter cloacae complex NOT DETECTED  NOT DETECTED Final   Escherichia coli NOT DETECTED NOT DETECTED Final   Klebsiella aerogenes NOT DETECTED NOT DETECTED Final   Klebsiella oxytoca NOT DETECTED NOT DETECTED Final   Klebsiella pneumoniae NOT DETECTED NOT DETECTED Final   Proteus species NOT DETECTED NOT DETECTED Final   Salmonella species NOT DETECTED NOT DETECTED Final   Serratia marcescens NOT DETECTED NOT DETECTED Final   Haemophilus influenzae NOT DETECTED NOT DETECTED Final   Neisseria meningitidis NOT DETECTED NOT DETECTED Final   Pseudomonas aeruginosa NOT DETECTED NOT DETECTED Final   Stenotrophomonas maltophilia NOT DETECTED NOT DETECTED Final   Candida albicans NOT DETECTED NOT DETECTED Final   Candida auris NOT DETECTED NOT DETECTED Final   Candida glabrata NOT DETECTED NOT DETECTED Final   Candida krusei NOT DETECTED NOT DETECTED Final   Candida parapsilosis NOT DETECTED NOT DETECTED Final   Candida tropicalis NOT DETECTED NOT DETECTED Final   Cryptococcus neoformans/gattii NOT DETECTED NOT DETECTED Final   Methicillin resistance mecA/C DETECTED (A) NOT DETECTED Final    Comment: CRITICAL RESULT CALLED TO, READ BACK BY AND VERIFIED WITH: T RUDISILL,PHARMD'@0315'$  09/12/21 [place] Performed at [facility], [address], [place], [place] [address]   Culture, blood (Routine X 2) w Reflex to ID Panel     Status: Abnormal   Collection Time: 09/11/21  5:44 AM   Specimen: Right Antecubital; Blood  Result Value Ref Range Status    Specimen Description   Final    RIGHT ANTECUBITAL BOTTLES DRAWN AEROBIC AND ANAEROBIC Performed at [facility], [address] [place], [place] [address]    Special Requests   Final    Blood Culture adequate volume Performed at [facility], [address], , [place] [address]    Culture  Setup Time   Final    GRAM POSITIVE COCCI AEROBIC BOTTLE ONLY Gram Stain Report Called to,Read Back By and Verified With: [facility] M. AT [place] AT 8366 ON [id number] BY THOMPSON S. CRITICAL VALUE NOTED.  VALUE IS CONSISTENT WITH PREVIOUSLY REPORTED AND CALLED VALUE.    Culture (A)  Final    STAPHYLOCOCCUS EPIDERMIDIS SUSCEPTIBILITIES PERFORMED ON PREVIOUS CULTURE WITHIN THE LAST 5 DAYS. Performed at [facility], [place] [address] [place], [place] [address]    Report Status 09/14/2021 FINAL  Final  MRSA Next Gen by PCR, Nasal     Status: None   Collection Time: 09/11/21  8:00 PM   Specimen: Nasal Mucosa; Nasal Swab  Result Value Ref Range Status   MRSA by PCR Next Gen NOT DETECTED NOT DETECTED Final    Comment: (NOTE) The GeneXpert MRSA Assay (FDA approved for NASAL specimens only), is one component of a comprehensive MRSA colonization surveillance program. It is not intended to diagnose MRSA infection nor to guide or monitor treatment for MRSA infections. Test performance is not FDA approved in patients less than 20 years old. Performed at [facility], [place] [address], [place], [place] [address]   Culture, blood (Routine X 2) w Reflex to ID Panel     Status: None (Preliminary result)   Collection Time: 09/14/21  6:18 AM   Specimen: BLOOD LEFT HAND  Result Value Ref Range Status   Specimen Description BLOOD LEFT HAND  Final   Special Requests   Final    BOTTLES DRAWN AEROBIC AND ANAEROBIC Blood Culture adequate volume   Culture   Final    NO GROWTH 4 DAYS Performed at [facility], [place]  [address], [place], [place] [address]    Report Status PENDING  Incomplete  Culture,  blood (Routine X 2) w Reflex to ID Panel     Status: None (Preliminary result)   Collection Time: 09/14/21  6:27 AM   Specimen: BLOOD LEFT HAND  Result Value Ref Range Status   Specimen Description BLOOD LEFT HAND  Final   Special Requests   Final    BOTTLES DRAWN AEROBIC AND ANAEROBIC Blood Culture adequate volume   Culture   Final    NO GROWTH 4 DAYS Performed at [facility], [place] [address], [place], [place] [address]    Report Status PENDING  Incomplete      Studies: DG CHEST PORT 1 VIEW  Result Date: 09/18/2021 CLINICAL DATA:  Follow-up thoracentesis EXAM: PORTABLE CHEST 1 VIEW COMPARISON:  09/16/2021 FINDINGS: Right internal jugular central line tip in the SVC at the azygos level. Less pleural fluid present on the left. No pneumothorax. Mild atelectasis at both lung bases. Emphysematous change in the upper lungs. IMPRESSION: Status post left thoracentesis. Much less pleural fluid on the left. No pneumothorax. Better pulmonary aeration. Electronically Signed   By: Nelson Chimes M.D.   On: 09/18/2021 14:50   CT CHEST WO CONTRAST  Result Date: 09/17/2021 CLINICAL DATA:  Pleural effusion, shortness of breath EXAM: CT CHEST WITHOUT CONTRAST TECHNIQUE: Multidetector CT imaging of the chest was performed following the standard protocol without IV contrast. RADIATION DOSE REDUCTION: This exam was performed according to the departmental dose-optimization program which includes automated exposure control, adjustment of the mA and/or kV according to patient size and/or use of iterative reconstruction technique. COMPARISON:  09/11/2021 FINDINGS: Cardiovascular: Aortic atherosclerosis. Normal heart size. Enlargement of the main pulmonary artery measuring up to 3.9 cm in caliber. Trace pericardial effusion similar to prior. Mediastinum/Nodes: Unchanged prominent mediastinal lymph nodes. Previously reported left hilar lymph nodes are poorly assessed on this noncontrast examination. Thyroid gland,  trachea, and esophagus demonstrate no significant findings. Lungs/Pleura: Moderate centrilobular emphysema. Diffuse bilateral bronchial wall thickening. Small bilateral pleural effusions and associated atelectasis or consolidation, increased compared to prior examination, particularly on the right. Previously seen left mainstem bronchial occlusion is resolved (series 5, image 71). Unchanged bandlike scarring and volume loss of the posterior lingula, right middle lobe, and right lung base (series 5, image 106). Unchanged subpleural nodule of the peripheral right upper lobe measuring 0.7 x 0.4 cm (series 5, image 41) Upper Abdomen: No acute abnormality. Musculoskeletal: No chest wall abnormality. No acute osseous findings. IMPRESSION: 1. Small bilateral pleural effusions and associated atelectasis or consolidation, increased compared to prior examination, particularly on the right. 2. Previously seen left mainstem bronchial debris and occlusion is resolved. 3. Unchanged underlying bandlike scarring and volume loss of the posterior lingula, right middle lobe, and right lung base. 4. Emphysema and diffuse bilateral bronchial wall thickening. 5. Enlargement of the main pulmonary artery, as can be seen in pulmonary hypertension. 6. Unchanged subpleural nodule of the peripheral right upper lobe measuring 0.7 cm. As previously reported, non-contrast chest CT at 6-12 months is recommended. If the nodule is stable at time of repeat CT, then future CT at 18-24 months (from today's scan) is considered optional for low-risk patients, but is recommended for high-risk patients. This recommendation follows the consensus statement: Guidelines for Management of Incidental Pulmonary Nodules Detected on CT Images: From the Fleischner Society 2017; Radiology 2017; 284:228-243. Aortic Atherosclerosis (ICD10-I70.0) and Emphysema (ICD10-J43.9). Electronically Signed   By: Jamse Mead.D.  On: 09/17/2021 16:57    Scheduled Meds:   arformoterol  15 mcg Nebulization BID   atorvastatin  20 mg Oral QHS   bethanechol  5 mg Oral BID   budesonide (PULMICORT) nebulizer solution  0.25 mg Nebulization BID   Chlorhexidine Gluconate Cloth  6 each Topical Daily   dextromethorphan-guaiFENesin  1 tablet Oral BID   escitalopram  10 mg Oral Daily   feeding supplement  237 mL Oral BID BM   folic acid  1 mg Oral Daily   furosemide  40 mg Intravenous BID   insulin aspart  0-15 Units Subcutaneous Q4H   levothyroxine  200 mcg Oral Q0600   multivitamin with minerals  1 tablet Oral Daily   pantoprazole  40 mg Oral Daily   potassium chloride  40 mEq Oral BID   revefenacin  175 mcg Nebulization Daily   risperiDONE  0.25 mg Oral QHS   sodium chloride flush  10-40 mL Intracatheter Q12H    Continuous Infusions:  sodium chloride Stopped (09/14/21 1500)   cefTRIAXone (ROCEPHIN)  IV 2 g (09/18/21 1451)   vancomycin 1,500 mg (09/18/21 1113)     LOS: 7 days     Alma Friendly, MD [facility]  If 7PM-7AM, please contact night-coverage [email or web address] 09/18/2021, 3:57 PM

## 2021-09-18 NOTE — Progress Notes (Signed)
Occupational Therapy Treatment Patient Details Name: Terri Casey MRN: [id number] DOB: [birth year] Today's Date: 09/18/2021   History of present illness pt is a 61 y/o female presenting to ED 9/11 with unresponsiveness and agonal breathing.  Found on the floor tipped over in her wheelchair in respiratory distress.  Failed BiPAP then intubated in the ED.  Concern for aspiration PNA.  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    PMH:  CHF, COPD, HTN, DM, Recent L foot fx/4th, 5th metatarsals and in CAM boot.   OT comments  Pt supine in bed and agreeable to OT session, pt just finished breakfast.  Pt on 11L HFNC upon entry with Spo2 82%, cueing through PLB with increased time to raise SpO2 to 88%.  Upon initating mobility, SpO2 desaturated again therefore increased to 15L for activity--SpO2 fluctuated between 84-89% on 15L; but able to decrease back to 11L at end of session with SpO2 at 92-94%.  Encouraged pursed lip breathing throughout session, as this greatly improved saturations.  Completed transfers with min assist using RW, total assist for toileting, and max assist for LB Adls.  Pt eager for dc home, reports having support from son and aide "most of the time"; but question cognition as she does not recall why shes here and reports she doesn't have a wheelchair at home.  Will follow acutely. Updated dc plan to [facility] with 24/7 support.    Recommendations for follow up therapy are one component of a multi-disciplinary discharge planning process, led by the attending physician.  Recommendations may be updated based on patient status, additional functional criteria and insurance authorization.    Follow Up Recommendations  Home health OT    Assistance Recommended at Discharge Frequent or constant Supervision/Assistance  Patient can return home with the following  A little help with walking and/or transfers;Assistance with  cooking/housework;Help with stairs or ramp for entrance;Assist for transportation;Direct supervision/assist for financial management;Direct supervision/assist for medications management;A lot of help with bathing/dressing/bathroom   Equipment Recommendations  None recommended by OT    Recommendations for Other Services      Precautions / Restrictions Precautions Precautions: Fall Required Braces or Orthoses: Other Brace Other Brace: CAM boot Lt foot Restrictions Weight Bearing Restrictions: Yes LLE Weight Bearing:  (through Heel ONLY, CAM boot on) Other Position/Activity Restrictions: CAM boot to LLE       Mobility Bed Mobility Overal bed mobility: Needs Assistance Bed Mobility: Supine to Sit, Sit to Supine     Supine to sit: Min assist Sit to supine: Min guard   General bed mobility comments: trunk to ascend with increased time and assist for line mgmt    Transfers Overall transfer level: Needs assistance Equipment used: Rolling walker (2 wheels) Transfers: Sit to/from Stand, Bed to chair/wheelchair/BSC Sit to Stand: Min assist     Step pivot transfers: Min assist     General transfer comment: to/from [facility], cueing for hand placement and safety     Balance Overall balance assessment: Needs assistance Sitting-balance support: No upper extremity supported, Feet supported Sitting balance-Leahy Scale: Fair     Standing balance support: Bilateral upper extremity supported, During functional activity Standing balance-Leahy Scale: Poor Standing balance comment: relies on UE and external support                           ADL either performed or assessed with clinical judgement   ADL Overall ADL's :  Needs assistance/impaired     Grooming: Wash/dry face;Set up;Sitting               Lower Body Dressing: Maximal assistance;Sit to/from stand   Toilet Transfer: Minimal assistance;Stand-pivot;BSC/3in1;Rolling walker (2 wheels)   Toileting- Clothing  Manipulation and Hygiene: Total assistance;Sit to/from stand Toileting - Clothing Manipulation Details (indicate cue type and reason): after +BM on commode     Functional mobility during ADLs: Minimal assistance;Rolling walker (2 wheels);Cueing for sequencing;Cueing for safety      Extremity/Trunk Assessment              Vision       Perception     Praxis      Cognition Arousal/Alertness: Awake/alert Behavior During Therapy: Flat affect Overall Cognitive Status: No family/caregiver present to determine baseline cognitive functioning Area of Impairment: Orientation, Following commands, Awareness, Problem solving, Memory, Safety/judgement                 Orientation Level: Disoriented to, Situation   Memory: Decreased short-term memory Following Commands: Follows one step commands consistently, Follows multi-step commands inconsistently, Follows one step commands with increased time Safety/Judgement: Decreased awareness of safety, Decreased awareness of deficits Awareness: Emergent Problem Solving: Slow processing, Difficulty sequencing, Requires verbal cues General Comments: pt aware of need to use restroom, unaware of why she is in the hopsital.  Follows simple commands but difficulty with mulitple step commands.        Exercises      Shoulder Instructions       General Comments Pt on 11L HFNC with SPO2 82% upon entry, increased to 88% with increased time and cueing for PLB.  Increased to 15L for activity, as dropped again to 82% when she began moving to EOB.  Pt ranged 84-89% on 15L during activity with cueing for PLB; returned to bed with SpO2 at 94%, decreased back to 11L and maintained 92-94%.    Pertinent Vitals/ Pain       Pain Assessment Pain Assessment: No/denies pain  Home Living                                          Prior Functioning/Environment              Frequency  Min 2X/week        Progress Toward  Goals  OT Goals(current goals can now be found in the care plan section)  Progress towards OT goals: Progressing toward goals  Acute Rehab OT Goals Patient Stated Goal: get home OT Goal Formulation: With patient Time For Goal Achievement: 09/29/21 Potential to Achieve Goals: Good  Plan Frequency remains appropriate;Discharge plan needs to be updated    Co-evaluation                 AM-PAC OT "6 Clicks" Daily Activity     Outcome Measure   Help from another person eating meals?: A Little Help from another person taking care of personal grooming?: A Little Help from another person toileting, which includes using toliet, bedpan, or urinal?: Total Help from another person bathing (including washing, rinsing, drying)?: A Lot Help from another person to put on and taking off regular upper body clothing?: A Little Help from another person to put on and taking off regular lower body clothing?: A Lot 6 Click Score: 14    End of Session Equipment Utilized During Treatment: Rolling  walker (2 wheels);Oxygen  OT Visit Diagnosis: History of falling (Z91.81);Muscle weakness (generalized) (M62.81)   Activity Tolerance Patient tolerated treatment well   Patient Left in bed;with call bell/phone within reach;with bed alarm set   Nurse Communication Mobility status;Other (comment) (would benefit from recliner in room, +BM during session)        Time: 5872-7618 OT Time Calculation (min): 27 min  Charges: OT General Charges $OT Visit: 1 Visit OT Treatments $Self Care/Home Management : 23-37 mins  Jolaine Artist, [place] Office [phone]   Delight Stare 09/18/2021, 10:25 AM

## 2021-09-18 NOTE — Procedures (Signed)
Thoracentesis  Procedure Note  ARNELLA PRALLE  [id number]  1960/09/14  Date:09/18/21  Time:1:44 PM   Provider Performing:Brent Meriam Chojnowski   Procedure: Thoracentesis with imaging guidance ([id number])  Indication(s) Pleural Effusion  Consent Risks of the procedure as well as the alternatives and risks of each were explained to the patient and/or caregiver.  Consent for the procedure was obtained and is signed in the bedside chart  Anesthesia Topical only with 1% lidocaine    Time Out Verified patient identification, verified procedure, site/side was marked, verified correct patient position, special equipment/implants available, medications/allergies/relevant history reviewed, required imaging and test results available.   Sterile Technique Maximal sterile technique including full sterile barrier drape, hand hygiene, sterile gown, sterile gloves, mask, hair covering, sterile ultrasound probe cover (if used).  Procedure Description Ultrasound was used to identify appropriate pleural anatomy for placement and overlying skin marked.  Area of drainage cleaned and draped in sterile fashion. Lidocaine was used to anesthetize the skin and subcutaneous tissue.  450 cc's of clear appearing fluid was drained from the left pleural space. Catheter then removed and bandaid applied to site.   Complications/Tolerance None; patient tolerated the procedure well. Chest X-ray is ordered to confirm no post-procedural complication.   EBL Minimal   Specimen(s) Pleural fluid    Roselie Awkward, MD [facility] Pager: [phone] Cell: [phone] After 7:00 pm call Elink  [phone]

## 2021-09-19 ENCOUNTER — Encounter: Payer: Medicaid Other | Admitting: Orthopedic Surgery

## 2021-09-19 ENCOUNTER — Inpatient Hospital Stay (HOSPITAL_COMMUNITY): Payer: Medicaid Other

## 2021-09-19 DIAGNOSIS — J90 Pleural effusion, not elsewhere classified: Secondary | ICD-10-CM

## 2021-09-19 DIAGNOSIS — J9602 Acute respiratory failure with hypercapnia: Secondary | ICD-10-CM | POA: Diagnosis not present

## 2021-09-19 DIAGNOSIS — J9601 Acute respiratory failure with hypoxia: Secondary | ICD-10-CM | POA: Diagnosis not present

## 2021-09-19 DIAGNOSIS — I480 Paroxysmal atrial fibrillation: Secondary | ICD-10-CM | POA: Diagnosis not present

## 2021-09-19 DIAGNOSIS — J449 Chronic obstructive pulmonary disease, unspecified: Secondary | ICD-10-CM

## 2021-09-19 DIAGNOSIS — I5032 Chronic diastolic (congestive) heart failure: Secondary | ICD-10-CM | POA: Diagnosis not present

## 2021-09-19 LAB — CBC WITH DIFFERENTIAL/PLATELET
Abs Immature Granulocytes: 0.04 10*3/uL (ref 0.00–0.07)
Basophils Absolute: 0 10*3/uL (ref 0.0–0.1)
Basophils Relative: 1 %
Eosinophils Absolute: 0.1 10*3/uL (ref 0.0–0.5)
Eosinophils Relative: 3 %
HCT: 30.5 % — ABNORMAL LOW (ref 36.0–46.0)
Hemoglobin: 8.9 g/dL — ABNORMAL LOW (ref 12.0–15.0)
Immature Granulocytes: 1 %
Lymphocytes Relative: 37 %
Lymphs Abs: 1.5 10*3/uL (ref 0.7–4.0)
MCH: 31 pg (ref 26.0–34.0)
MCHC: 29.2 g/dL — ABNORMAL LOW (ref 30.0–36.0)
MCV: 106.3 fL — ABNORMAL HIGH (ref 80.0–100.0)
Monocytes Absolute: 0.6 10*3/uL (ref 0.1–1.0)
Monocytes Relative: 15 %
Neutro Abs: 1.9 10*3/uL (ref 1.7–7.7)
Neutrophils Relative %: 43 %
Platelets: 162 10*3/uL (ref 150–400)
RBC: 2.87 MIL/uL — ABNORMAL LOW (ref 3.87–5.11)
RDW: 14.9 % (ref 11.5–15.5)
WBC: 4.2 10*3/uL (ref 4.0–10.5)
nRBC: 0 % (ref 0.0–0.2)

## 2021-09-19 LAB — GLUCOSE, CAPILLARY
Glucose-Capillary: 100 mg/dL — ABNORMAL HIGH (ref 70–99)
Glucose-Capillary: 115 mg/dL — ABNORMAL HIGH (ref 70–99)
Glucose-Capillary: 117 mg/dL — ABNORMAL HIGH (ref 70–99)
Glucose-Capillary: 125 mg/dL — ABNORMAL HIGH (ref 70–99)
Glucose-Capillary: 74 mg/dL (ref 70–99)
Glucose-Capillary: 99 mg/dL (ref 70–99)

## 2021-09-19 LAB — CULTURE, BLOOD (ROUTINE X 2)
Culture: NO GROWTH
Culture: NO GROWTH
Special Requests: ADEQUATE
Special Requests: ADEQUATE

## 2021-09-19 LAB — BASIC METABOLIC PANEL
Anion gap: 8 (ref 5–15)
BUN: <5 mg/dL — ABNORMAL LOW (ref 8–23)
CO2: 43 mmol/L — ABNORMAL HIGH (ref 22–32)
Calcium: 8.6 mg/dL — ABNORMAL LOW (ref 8.9–10.3)
Chloride: 90 mmol/L — ABNORMAL LOW (ref 98–111)
Creatinine, Ser: 0.55 mg/dL (ref 0.44–1.00)
GFR, Estimated: >60 mL/min (ref >60)
Glucose, Bld: 93 mg/dL (ref 70–99)
Potassium: 3.6 mmol/L (ref 3.5–5.1)
Sodium: 141 mmol/L (ref 135–145)

## 2021-09-19 MED ORDER — RIVAROXABAN 20 MG PO TABS
20.0000 mg | ORAL_TABLET | Freq: Every day | ORAL | Status: DC
  Administered 2021-09-20 – 2021-09-25 (×6): 20 mg via ORAL
  Filled 2021-09-19 (×6): qty 1

## 2021-09-19 NOTE — Progress Notes (Signed)
   NAME:  Terri Casey, MRN:  [id number], DOB:  [birth year], LOS: 8 ADMISSION DATE:  09/11/2021, CONSULTATION DATE:  9/18 REFERRING MD:  Manuella Ghazi, CHIEF COMPLAINT:  Dyspnea   History of Present Illness:  61 y/o female with admitted for acute respiratory failure with hypercarbia in setting of severe COPD and aspiration pneumonia.  Required intubation.  PCCM has been following for persistent hypoxemic respiratory failure.    Pertinent  Medical History  Anxiety Asthma dCHF COPD - no pfts on file  Diabetes mellitus without complication Hypertension not on acei Hypothyroidism PAF OSA not on CPAP  Significant Hospital Events: Including procedures, antibiotic start and stop dates in addition to other pertinent events   9/11 transferred [facility] to [facility], intubated, on vasopressors; blood cultures positive for staph epi 3/4 bottles 9/13 extubated 9/14 blood cultures ngtd 9/18: thoracentesis performed with 450 cc of clear fluid drained  Interim History / Subjective:  Yesterday thoracentesis w/ 450 cc fluid removed  On 9L [place] Some increase WOB with exertion Able to speak in complete sentences  Objective   Blood pressure 129/67, pulse 97, temperature 98.6 F (37 C), temperature source Oral, resp. rate 17, height '5\' 9"'$  (1.753 m), weight 110.4 kg, SpO2 99 %.        Intake/Output Summary (Last 24 hours) at 09/19/2021 0835 Last data filed at 09/19/2021 0800 Gross per 24 hour  Intake 900 ml  Output --  Net 900 ml    Filed Weights   09/16/21 0014 09/18/21 0010 09/19/21 0434  Weight: 119.6 kg 114.1 kg 110.4 kg    Examination: General:  chronic ill appearing in NAD HEENT: MM pink/moist; [place] in place Neuro: Aox3; MAE CV: s1s2, no m/r/g PULM:  dim clear BS bilaterally; salter  9L GI: soft, bsx4 active  Extremities: warm/dry, trace ble edema  Skin: no rashes or lesions appreciated  Resolved Hospital Problem list     Assessment & Plan:  Acute respiratory failure with  hypoxemia Aspiration pneumonia Staph epi bacteremia COPD, presumed AKI, ATN Centrilobular emphysema Hypothyroidism Chronic respiratory failure with hypercapnia>  Chronic respiratory failure with hypoxemia on 6L O2 Discussion: She has persistent hypoxemia which is likely multifactorial from pneumonia with underlying emphysema.  I agree that we need to assess the pleural fluid given her ongoing tobacco use and recent infection.  Will plan for thoracentesis today. Suspect the overall situation will take weeks to recovery. P: -CXR this am pending -consider thoracentesis pending CXR; xarelto remains on hold; consider resuming heparin vs xarelto if need for repeat thoracentesis anticipated -f/u pleural studies/cultures -continue vanc/rocephin -wean O2 for sats >90% -bipap qhs -continue lasix per primary -Pulm toiletry: IS -OOB/PT/OT   Best Practice (right click and "Reselect all SmartList Selections" daily)  Per [place], PA-C [facility] 09/19/2021, 8:43 AM  Please see [email or web address] for pager details.  From 7A-7P if no response, please call [phone]. After hours, please call ELink [phone].

## 2021-09-19 NOTE — Progress Notes (Signed)
Physical Therapy Treatment Patient Details Name: Terri Casey MRN: [id number] DOB: October 07, 1960 Today's Date: 09/19/2021   History of Present Illness Pt is a 61 y.o. female admitted 09/11/21 after found floor tipped over in w/c with agonal breathing, unresponsiveness. Intubated in ED; ETT 9/11-9/13. Workup for acute encephalopathy, sepsis, acute hypoxemic respiratory failure due to severe COPD and aspiration PNA. S/p thoracentesis 9/18. PMH includes recent L foot fx (4-5th metatarsals; WB through heel in cam boot), CHF, COPD, HTN, DM.   PT Comments    Pt progressing with mobility, noted improvement in O2 needs. Pt able to transfer and perform brief bout of standing activity with RW and min guard for balance while L foot cam boot donned. Pt remains limited by generalized weakness, decreased activity tolerance, and impaired balance strategies/postural reactions. Pt unsure of DME available at home, but would benefit from RW use to maintain L foot PWB and w/c due to limited activity tolerance. Continue to recommend HHPT to maximize functional mobility and independence upon return home.  SpO2 down to 86% on 4L O2 [place] with activity    Recommendations for follow up therapy are one component of a multi-disciplinary discharge planning process, led by the attending physician.  Recommendations may be updated based on patient status, additional functional criteria and insurance authorization.  Follow Up Recommendations  Home health PT     Assistance Recommended at Discharge Intermittent Supervision/Assistance  Patient can return home with the following A little help with walking and/or transfers;A little help with bathing/dressing/bathroom;Assistance with cooking/housework;Direct supervision/assist for medications management;Direct supervision/assist for financial management;Assist for transportation;Help with stairs or ramp for entrance   Equipment Recommendations  Rolling walker (2 wheels);Wheelchair  (measurements PT);Wheelchair cushion (measurements PT)    Recommendations for Other Services  Mobility Specialist     Precautions / Restrictions Precautions Precautions: Fall;Other (comment) Precaution Comments: Watch SpO2 (down to 86% on 4L O2 [place]) Required Braces or Orthoses: Other Brace Other Brace: L foot cam boot Restrictions Weight Bearing Restrictions: Yes LLE Weight Bearing: Partial weight bearing Other Position/Activity Restrictions: WB through heel in L foot CAM boot     Mobility  Bed Mobility Overal bed mobility: Modified Independent Bed Mobility: Supine to Sit                Transfers Overall transfer level: Needs assistance Equipment used: Rolling walker (2 wheels) Transfers: Sit to/from Stand Sit to Stand: Min guard           General transfer comment: reliant on momentum to power up to standing from EOB to RW, good hand placement without cues, min guard for balance    Ambulation/Gait Ambulation/Gait assistance: Min guard Gait Distance (Feet): 4 Feet Assistive device: Rolling walker (2 wheels) Gait Pattern/deviations: Step-to pattern, Decreased weight shift to left, Trunk flexed Gait velocity: Decreased     General Gait Details: pt marching in place, taking a few steps forwards/backwards with RW and min guard, cues for WB precautions; pt requesting seated rest due to fatigue and SOB   Stairs             Wheelchair Mobility    Modified Rankin (Stroke Patients Only)       Balance Overall balance assessment: Needs assistance Sitting-balance support: No upper extremity supported, Feet supported Sitting balance-Leahy Scale: Good     Standing balance support: Bilateral upper extremity supported, During functional activity Standing balance-Leahy Scale: Poor Standing balance comment: reliant on UE support  Cognition Arousal/Alertness: Awake/alert Behavior During Therapy: WFL for tasks  assessed/performed Overall Cognitive Status: No family/caregiver present to determine baseline cognitive functioning Area of Impairment: Following commands, Awareness, Problem solving, Memory, Safety/judgement, Attention                   Current Attention Level: Selective Memory: Decreased short-term memory Following Commands: Follows one step commands consistently Safety/Judgement: Decreased awareness of deficits Awareness: Emergent Problem Solving: Requires verbal cues General Comments: pt unsure of home DME, reports PT needs to ask her home aide        Exercises      General Comments General comments (skin integrity, edema, etc.): CAM boot on for standing activity - pt reports boot has not been removed since admission, RN present and reports MD told nursing "not to touch it" - cam boot removed at end of session to check skin integrity and allow boot to air out, noted L ankle swelling (pt tolerating gentle ankle/toe AROM); RN present and aware to replace boot later today (and before any WB activity). SpO2 down to 86% on 4L O2 [place] with reliable pleth      Pertinent Vitals/Pain Pain Assessment Pain Assessment: No/denies pain Pain Intervention(s): Monitored during session    Home Living                          Prior Function            PT Goals (current goals can now be found in the care plan section) Progress towards PT goals: Progressing toward goals    Frequency    Min 3X/week      PT Plan Current plan remains appropriate    Co-evaluation              AM-PAC PT "6 Clicks" Mobility   Outcome Measure  Help needed turning from your back to your side while in a flat bed without using bedrails?: None Help needed moving from lying on your back to sitting on the side of a flat bed without using bedrails?: A Little Help needed moving to and from a bed to a chair (including a wheelchair)?: A Little Help needed standing up from a chair using your  arms (e.g., wheelchair or bedside chair)?: A Little Help needed to walk in hospital room?: A Lot Help needed climbing 3-5 steps with a railing? : A Lot 6 Click Score: 17    End of Session Equipment Utilized During Treatment: Oxygen Activity Tolerance: Patient tolerated treatment well;Patient limited by fatigue Patient left: in bed;with call bell/phone within reach;with bed alarm set (seated EOB to eat lunch; pt declines this PT getting recliner for room because she will not sit in it) Nurse Communication: Mobility status PT Visit Diagnosis: Unsteadiness on feet (R26.81);Muscle weakness (generalized) (M62.81)     Time: 1607-3710 PT Time Calculation (min) (ACUTE ONLY): 14 min  Charges:  $Therapeutic Activity: 8-22 mins                     Mabeline Caras, PT, DPT Acute Rehabilitation Services  Personal: [facility] Office: [place] 09/19/2021, 1:36 PM

## 2021-09-19 NOTE — Progress Notes (Addendum)
PROGRESS NOTE  Terri Casey:[id number] DOB: April 04, 1960 DOA: 09/11/2021 PCP: [name], NP  HPI/Recap of past 25 hours: 61 year old female active smoker with hx of COPD, diastolic HF, diabetes mellitus, hypertension, hypothyroidism, paroxysmal A-fib, OSA, presented to ED with unresponsiveness and agonal breathing. Found on floor tipped over in wheelchair in respiratory distress. Failed BiPAP and then intubated in the ED. PCCM admitted for respiratory failure.  Patient was intubated on 9/11, further stabilized extubated on 9/13.  [facility] assumed care on 9/14.    Today, met patient at bedside commode, reported some diarrhea.  Still SOB, but denies its worsening   Assessment/Plan: Principal Problem:   Septic shock (HCC) Active Problems:   Acute hypercapnic respiratory failure (HCC)   Chronic diastolic CHF (congestive heart failure) (HCC)   AF (paroxysmal atrial fibrillation) (HCC)   Essential hypertension, benign   Hyperlipidemia   Hypothyroidism   PNA (pneumonia)   Acute hypoxemic respiratory failure (HCC)   Respiratory failure (HCC)   Acute encephalopathy secondary to sepsis Septic shock secondary aspiration, possible staph epidermidis bacteremia Encephalopathy resolved Currently off Levophed- BP has remained stable 1 of 3 Bcx methicillin resistant staph epidermidis. MRSA negative, repeat blood cultures NGTD Continue Vanco and ceftriaxone Monitor closely  Acute on chronic hypoxemic hypercapnic respiratory failure  Aspiration pneumonia COPD Active smoker Left-sided pleural effusion shown on x-ray on 09/16/2021 s/p thoracentesis by PCCM on 09/18/2021 CT Chest with left mediastinal shift with fluid/debri in left main bronchus Intubated on 9/11, extubated on 9/13, currently now on BiPAP at night Chest x-ray showed left pleural effusion with pulmonary edema PCCM reconsulted, appreciate recs Duonebs, Pulmicort, Brovana nebulizer, flutter valve, pulm  hygiene CT chest without contrast showed small bilateral pleural effusions and associated atelectasis or consolidation particularly on the right Body fluids analysis Continue abx as above  Diarrhea Improved Accidentally removed rectal tube No need to replace, monitor closely  Paroxysmal atrial fibrillation Currently in NSR Restart Xarelto, hold metoprolol due to soft BP Telemetry  Chronic diastolic heart failure, likely acute Hypertension Chest x-ray with pulmonary edema, left-sided pleural effusion Echo showed EF of 60 to 65%, no regional wall motion abnormality, grade 1 diastolic dysfunction S/p thoracentesis as above Stop IV Lasix, hold amlodipine  Hypokalemia Replace as needed  Macrocytic Anemia  Hemoglobin currently down to 10.4 Anemia panel unremarkable Daily CBC   Fracture of 4th and 5th metatarsal  Hx fall in August Followed by Ortho outpatient. Scheduled for 9/19, may need to reschedule once stable NWB on forefoot   Hypothyroidism Synthroid   ?Hx migraines Family confirmed these medications are for migraines Hold imitrex and topamax for now   Chronic pain Resume home Percocet, hold flexeril   Depression Resume home lexapro  Obesity Lifestyle modification advised  Tobacco abuse Reluctant to quit  BiPAP at bedtime and nap times       Estimated body mass index is 35.94 kg/m as calculated from the following:   Height as of this encounter: 5' 9"  (1.753 m).   Weight as of this encounter: 110.4 kg.     Code Status: Full  Family Communication: None at bedside  Disposition Plan: Status is: Inpatient Remains inpatient appropriate because: Level of care      Consultants: PCCM  Procedures: Mechanical ventilation  Antimicrobials: Vancomycin, ceftriaxone  DVT prophylaxis: Xarelto   Objective: Vitals:   09/18/21 1940 09/19/21 0434 09/19/21 0759 09/19/21 0901  BP: (!) 100/54 (!) 102/56 129/67   Pulse: 96 97  (!) 102  Resp: 17 17   18   Temp: 98.6 F (37 C) 98.6 F (37 C)    TempSrc: Oral Oral    SpO2: 95% 99%  96%  Weight:  110.4 kg    Height:        Intake/Output Summary (Last 24 hours) at 09/19/2021 1523 Last data filed at 09/19/2021 0800 Gross per 24 hour  Intake 540 ml  Output --  Net 540 ml   Filed Weights   09/16/21 0014 09/18/21 0010 09/19/21 0434  Weight: 119.6 kg 114.1 kg 110.4 kg    Exam: General: NAD, chronically ill-appearing, appears older than stated age Cardiovascular: S1, S2 present Respiratory: Diminished breath sounds bilaterally Abdomen: Soft, nontender, nondistended, bowel sounds present Musculoskeletal: bilateral pedal edema noted Skin: Normal Psychiatry: Normal mood     Data Reviewed: CBC: Recent Labs  Lab 09/15/21 0500 09/16/21 0650 09/17/21 0500 09/18/21 0425 09/19/21 0834  WBC 5.7 4.9 5.2 5.0 4.2  NEUTROABS  --   --  3.2 2.7 1.9  HGB 10.1* 9.5* 8.8* 8.9* 8.9*  HCT 36.2 33.3* 29.9* 30.4* 30.5*  MCV 109.4* 110.3* 106.8* 105.9* 106.3*  PLT 136* 121* 120* 143* 883   Basic Metabolic Panel: Recent Labs  Lab 09/13/21 0353 09/13/21 1212 09/14/21 0400 09/15/21 0500 09/16/21 0650 09/17/21 0500 09/18/21 0425 09/19/21 0834  NA 140   < > 145 142 141 139 142 141  K 2.5*   < > 3.9 4.2 4.4 3.7 3.2* 3.6  CL 99   < > 103 101 99 95* 92* 90*  CO2 31   < > 33* 34* 36* 39* 44* 43*  GLUCOSE 95   < > 95 89 92 84 86 93  BUN 20   < > 11 7* 6* <5* <5* <5*  CREATININE 0.92   < > 0.79 0.70 0.67 0.65 0.60 0.55  CALCIUM 7.6*   < > 8.3* 8.4* 8.3* 8.4* 8.6* 8.6*  MG 1.9  --  2.3  --   --   --  1.9  --   PHOS 3.0  --  4.2  --   --   --   --   --    < > = values in this interval not displayed.   GFR: Estimated Creatinine Clearance: 96.6 mL/min (by C-G formula based on SCr of 0.55 mg/dL). Liver Function Tests: No results for input(s): "AST", "ALT", "ALKPHOS", "BILITOT", "PROT", "ALBUMIN" in the last 168 hours.  No results for input(s): "LIPASE", "AMYLASE" in the last 168  hours. No results for input(s): "AMMONIA" in the last 168 hours. Coagulation Profile: No results for input(s): "INR", "PROTIME" in the last 168 hours.  Cardiac Enzymes: No results for input(s): "CKTOTAL", "CKMB", "CKMBINDEX", "TROPONINI" in the last 168 hours. BNP (last 3 results) No results for input(s): "PROBNP" in the last 8760 hours. HbA1C: No results for input(s): "HGBA1C" in the last 72 hours.  CBG: Recent Labs  Lab 09/18/21 2021 09/19/21 0019 09/19/21 0437 09/19/21 0823 09/19/21 1137  GLUCAP 125* 74 115* 99 100*   Lipid Profile: No results for input(s): "CHOL", "HDL", "LDLCALC", "TRIG", "CHOLHDL", "LDLDIRECT" in the last 72 hours.  Thyroid Function Tests: No results for input(s): "TSH", "T4TOTAL", "FREET4", "T3FREE", "THYROIDAB" in the last 72 hours.  Anemia Panel: No results for input(s): "VITAMINB12", "FOLATE", "FERRITIN", "TIBC", "IRON", "RETICCTPCT" in the last 72 hours.  Urine analysis:    Component Value Date/Time   COLORURINE AMBER (A) 09/11/2021 0526   APPEARANCEUR CLOUDY (A) 09/11/2021 0526   LABSPEC 1.016  09/11/2021 0526   PHURINE 5.0 09/11/2021 0526   GLUCOSEU 50 (A) 09/11/2021 0526   HGBUR NEGATIVE 09/11/2021 0526   BILIRUBINUR NEGATIVE 09/11/2021 0526   KETONESUR NEGATIVE 09/11/2021 0526   PROTEINUR 100 (A) 09/11/2021 0526   NITRITE NEGATIVE 09/11/2021 0526   LEUKOCYTESUR NEGATIVE 09/11/2021 0526   Sepsis Labs: @LABRCNTIP (procalcitonin:4,lacticidven:4)  ) Recent Results (from the past 240 hour(s))  Culture, blood (Routine X 2) w Reflex to ID Panel     Status: Abnormal   Collection Time: 09/11/21  5:38 AM   Specimen: Left Antecubital; Blood  Result Value Ref Range Status   Specimen Description   Final    LEFT ANTECUBITAL BOTTLES DRAWN AEROBIC AND ANAEROBIC Performed at [facility], [address], [place], [place] [address]    Special Requests   Final    Blood Culture adequate volume Performed at [facility], [address],  [place], [place] [address]    Culture  Setup Time   Final    GRAM POSITIVE COCCI ANAEROBIC AND AEROBIC BOTTLES Gram Stain Report Called to,Read Back By and Verified With: SHEPARD,B@2330  BY MATTHEWS, B 9.11.2023 CRITICAL RESULT CALLED TO, READ BACK BY AND VERIFIED WITH: T RUDISILL,PHARMD@0313  09/12/21 [place]    Culture (A)  Final    STAPHYLOCOCCUS EPIDERMIDIS THE SIGNIFICANCE OF ISOLATING THIS ORGANISM FROM A SINGLE SET OF BLOOD CULTURES WHEN MULTIPLE SETS ARE DRAWN IS UNCERTAIN. PLEASE NOTIFY THE MICROBIOLOGY DEPARTMENT WITHIN ONE WEEK IF SPECIATION AND SENSITIVITIES ARE REQUIRED. Performed at [facility], [place] [address], [place], [place] [address]    Report Status 09/15/2021 FINAL  Final  Blood Culture ID Panel (Reflexed)     Status: Abnormal   Collection Time: 09/11/21  5:38 AM  Result Value Ref Range Status   Enterococcus faecalis NOT DETECTED NOT DETECTED Final   Enterococcus Faecium NOT DETECTED NOT DETECTED Final   Listeria monocytogenes NOT DETECTED NOT DETECTED Final   Staphylococcus species DETECTED (A) NOT DETECTED Final    Comment: CRITICAL RESULT CALLED TO, READ BACK BY AND VERIFIED WITH: T RUDISILL,PHARMD@0315  09/12/21 [place]    Staphylococcus aureus (BCID) NOT DETECTED NOT DETECTED Final   Staphylococcus epidermidis DETECTED (A) NOT DETECTED Final    Comment: Methicillin (oxacillin) resistant coagulase negative staphylococcus. Possible blood culture contaminant (unless isolated from more than one blood culture draw or clinical case suggests pathogenicity). No antibiotic treatment is indicated for blood  culture contaminants. CRITICAL RESULT CALLED TO, READ BACK BY AND VERIFIED WITH: T RUDISILL,PHARMD@0315  09/12/21 [place]    Staphylococcus lugdunensis NOT DETECTED NOT DETECTED Final   Streptococcus species NOT DETECTED NOT DETECTED Final   Streptococcus agalactiae NOT DETECTED NOT DETECTED Final   Streptococcus pneumoniae NOT DETECTED NOT DETECTED Final   Streptococcus pyogenes NOT  DETECTED NOT DETECTED Final   A.calcoaceticus-baumannii NOT DETECTED NOT DETECTED Final   Bacteroides fragilis NOT DETECTED NOT DETECTED Final   Enterobacterales NOT DETECTED NOT DETECTED Final   Enterobacter cloacae complex NOT DETECTED NOT DETECTED Final   Escherichia coli NOT DETECTED NOT DETECTED Final   Klebsiella aerogenes NOT DETECTED NOT DETECTED Final   Klebsiella oxytoca NOT DETECTED NOT DETECTED Final   Klebsiella pneumoniae NOT DETECTED NOT DETECTED Final   Proteus species NOT DETECTED NOT DETECTED Final   Salmonella species NOT DETECTED NOT DETECTED Final   Serratia marcescens NOT DETECTED NOT DETECTED Final   Haemophilus influenzae NOT DETECTED NOT DETECTED Final   Neisseria meningitidis NOT DETECTED NOT DETECTED Final   Pseudomonas aeruginosa NOT DETECTED NOT DETECTED Final  Stenotrophomonas maltophilia NOT DETECTED NOT DETECTED Final   Candida albicans NOT DETECTED NOT DETECTED Final   Candida auris NOT DETECTED NOT DETECTED Final   Candida glabrata NOT DETECTED NOT DETECTED Final   Candida krusei NOT DETECTED NOT DETECTED Final   Candida parapsilosis NOT DETECTED NOT DETECTED Final   Candida tropicalis NOT DETECTED NOT DETECTED Final   Cryptococcus neoformans/gattii NOT DETECTED NOT DETECTED Final   Methicillin resistance mecA/C DETECTED (A) NOT DETECTED Final    Comment: CRITICAL RESULT CALLED TO, READ BACK BY AND VERIFIED WITH: T RUDISILL,PHARMD@0315  09/12/21 [place] Performed at [facility], [address] [place], [place] [address]   Culture, blood (Routine X 2) w Reflex to ID Panel     Status: Abnormal   Collection Time: 09/11/21  5:44 AM   Specimen: Right Antecubital; Blood  Result Value Ref Range Status   Specimen Description   Final    RIGHT ANTECUBITAL BOTTLES DRAWN AEROBIC AND ANAEROBIC Performed at [facility], [address] [place], [place] [address]    Special Requests   Final    Blood Culture adequate volume Performed at [facility],  [address] [place], [place] [address]    Culture  Setup Time   Final    GRAM POSITIVE COCCI AEROBIC BOTTLE ONLY Gram Stain Report Called to,Read Back By and Verified With: [facility] M. AT [place] AT 7628 ON [id number] BY THOMPSON S. CRITICAL VALUE NOTED.  VALUE IS CONSISTENT WITH PREVIOUSLY REPORTED AND CALLED VALUE.    Culture (A)  Final    STAPHYLOCOCCUS EPIDERMIDIS SUSCEPTIBILITIES PERFORMED ON PREVIOUS CULTURE WITHIN THE LAST 5 DAYS. Performed at [facility], [place] [address] [place], [place] [address]    Report Status 09/14/2021 FINAL  Final  MRSA Next Gen by PCR, Nasal     Status: None   Collection Time: 09/11/21  8:00 PM   Specimen: Nasal Mucosa; Nasal Swab  Result Value Ref Range Status   MRSA by PCR Next Gen NOT DETECTED NOT DETECTED Final    Comment: (NOTE) The GeneXpert MRSA Assay (FDA approved for NASAL specimens only), is one component of a comprehensive MRSA colonization surveillance program. It is not intended to diagnose MRSA infection nor to guide or monitor treatment for MRSA infections. Test performance is not FDA approved in patients less than 1 years old. Performed at [facility], [place] [address] [place], [place] [address]   Culture, blood (Routine X 2) w Reflex to ID Panel     Status: None   Collection Time: 09/14/21  6:18 AM   Specimen: BLOOD LEFT HAND  Result Value Ref Range Status   Specimen Description BLOOD LEFT HAND  Final   Special Requests   Final    BOTTLES DRAWN AEROBIC AND ANAEROBIC Blood Culture adequate volume   Culture   Final    NO GROWTH 5 DAYS Performed at [facility], [place] [address] [place], [place] [address]    Report Status 09/19/2021 FINAL  Final  Culture, blood (Routine X 2) w Reflex to ID Panel     Status: None   Collection Time: 09/14/21  6:27 AM   Specimen: BLOOD LEFT HAND  Result Value Ref Range Status   Specimen Description BLOOD LEFT HAND  Final   Special Requests   Final    BOTTLES DRAWN AEROBIC AND ANAEROBIC Blood  Culture adequate volume   Culture   Final    NO GROWTH 5 DAYS Performed at [facility], [address] [place], [place] [address]    Report Status 09/19/2021 FINAL  Final  Body fluid culture w Gram Stain     Status: None (Preliminary result)   Collection Time: 09/18/21  1:42 PM   Specimen: Pleural Fluid  Result Value Ref Range Status   Specimen Description PLEURAL  Final   Special Requests NONE  Final   Gram Stain   Final    RARE WBC PRESENT,BOTH PMN AND MONONUCLEAR NO ORGANISMS SEEN    Culture   Final    NO GROWTH < 24 HOURS Performed at [facility], [place] [address] [place], [place] [address]    Report Status PENDING  Incomplete      Studies: DG Chest Port 1 View  Result Date: 09/19/2021 CLINICAL DATA:  Shortness of breath. EXAM: PORTABLE CHEST 1 VIEW COMPARISON:  September 18, 2021. FINDINGS: Stable cardiomediastinal silhouette. Right internal jugular catheter is unchanged. Mildly increased bibasilar opacities are noted concerning for infiltrates or atelectasis. Small pleural effusions may be present. Bony thorax is unremarkable. IMPRESSION: Increased bibasilar opacities as described above. Electronically Signed   By: Marijo Conception M.D.   On: 09/19/2021 09:42    Scheduled Meds:  arformoterol  15 mcg Nebulization BID   atorvastatin  20 mg Oral QHS   bethanechol  5 mg Oral BID   budesonide (PULMICORT) nebulizer solution  0.25 mg Nebulization BID   Chlorhexidine Gluconate Cloth  6 each Topical Daily   dextromethorphan-guaiFENesin  1 tablet Oral BID   escitalopram  10 mg Oral Daily   feeding supplement  237 mL Oral BID BM   folic acid  1 mg Oral Daily   furosemide  40 mg Intravenous BID   insulin aspart  0-15 Units Subcutaneous Q4H   levothyroxine  200 mcg Oral Q0600   multivitamin with minerals  1 tablet Oral Daily   pantoprazole  40 mg Oral Daily   revefenacin  175 mcg Nebulization Daily   risperiDONE  0.25 mg Oral QHS   sodium chloride flush  10-40 mL  Intracatheter Q12H    Continuous Infusions:  sodium chloride Stopped (09/14/21 1500)   cefTRIAXone (ROCEPHIN)  IV 2 g (09/19/21 1148)   vancomycin 1,500 mg (09/19/21 1330)     LOS: 8 days     Alma Friendly, MD [facility]  If 7PM-7AM, please contact night-coverage [email or web address] 09/19/2021, 3:23 PM

## 2021-09-20 ENCOUNTER — Inpatient Hospital Stay (HOSPITAL_COMMUNITY): Payer: Medicaid Other

## 2021-09-20 DIAGNOSIS — J9601 Acute respiratory failure with hypoxia: Secondary | ICD-10-CM | POA: Diagnosis not present

## 2021-09-20 DIAGNOSIS — A419 Sepsis, unspecified organism: Secondary | ICD-10-CM | POA: Diagnosis not present

## 2021-09-20 DIAGNOSIS — I480 Paroxysmal atrial fibrillation: Secondary | ICD-10-CM | POA: Diagnosis not present

## 2021-09-20 DIAGNOSIS — J9602 Acute respiratory failure with hypercapnia: Secondary | ICD-10-CM | POA: Diagnosis not present

## 2021-09-20 DIAGNOSIS — E782 Mixed hyperlipidemia: Secondary | ICD-10-CM

## 2021-09-20 LAB — CBC WITH DIFFERENTIAL/PLATELET
Abs Immature Granulocytes: 0.02 10*3/uL (ref 0.00–0.07)
Basophils Absolute: 0 10*3/uL (ref 0.0–0.1)
Basophils Relative: 1 %
Eosinophils Absolute: 0.1 10*3/uL (ref 0.0–0.5)
Eosinophils Relative: 4 %
HCT: 28.3 % — ABNORMAL LOW (ref 36.0–46.0)
Hemoglobin: 8.4 g/dL — ABNORMAL LOW (ref 12.0–15.0)
Immature Granulocytes: 1 %
Lymphocytes Relative: 40 %
Lymphs Abs: 1.5 10*3/uL (ref 0.7–4.0)
MCH: 31.7 pg (ref 26.0–34.0)
MCHC: 29.7 g/dL — ABNORMAL LOW (ref 30.0–36.0)
MCV: 106.8 fL — ABNORMAL HIGH (ref 80.0–100.0)
Monocytes Absolute: 0.5 10*3/uL (ref 0.1–1.0)
Monocytes Relative: 14 %
Neutro Abs: 1.5 10*3/uL — ABNORMAL LOW (ref 1.7–7.7)
Neutrophils Relative %: 40 %
Platelets: 157 10*3/uL (ref 150–400)
RBC: 2.65 MIL/uL — ABNORMAL LOW (ref 3.87–5.11)
RDW: 15 % (ref 11.5–15.5)
WBC: 3.7 10*3/uL — ABNORMAL LOW (ref 4.0–10.5)
nRBC: 0 % (ref 0.0–0.2)

## 2021-09-20 LAB — BASIC METABOLIC PANEL
Anion gap: 9 (ref 5–15)
BUN: 5 mg/dL — ABNORMAL LOW (ref 8–23)
CO2: 43 mmol/L — ABNORMAL HIGH (ref 22–32)
Calcium: 8.9 mg/dL (ref 8.9–10.3)
Chloride: 91 mmol/L — ABNORMAL LOW (ref 98–111)
Creatinine, Ser: 0.75 mg/dL (ref 0.44–1.00)
GFR, Estimated: >60 mL/min (ref >60)
Glucose, Bld: 83 mg/dL (ref 70–99)
Potassium: 3.7 mmol/L (ref 3.5–5.1)
Sodium: 143 mmol/L (ref 135–145)

## 2021-09-20 LAB — GLUCOSE, CAPILLARY
Glucose-Capillary: 102 mg/dL — ABNORMAL HIGH (ref 70–99)
Glucose-Capillary: 103 mg/dL — ABNORMAL HIGH (ref 70–99)
Glucose-Capillary: 106 mg/dL — ABNORMAL HIGH (ref 70–99)
Glucose-Capillary: 110 mg/dL — ABNORMAL HIGH (ref 70–99)
Glucose-Capillary: 114 mg/dL — ABNORMAL HIGH (ref 70–99)
Glucose-Capillary: 114 mg/dL — ABNORMAL HIGH (ref 70–99)
Glucose-Capillary: 122 mg/dL — ABNORMAL HIGH (ref 70–99)
Glucose-Capillary: 122 mg/dL — ABNORMAL HIGH (ref 70–99)

## 2021-09-20 LAB — CYTOLOGY - NON PAP

## 2021-09-20 LAB — VANCOMYCIN, PEAK: Vancomycin Pk: 37 ug/mL (ref 30–40)

## 2021-09-20 MED ORDER — FUROSEMIDE 10 MG/ML IJ SOLN
40.0000 mg | Freq: Two times a day (BID) | INTRAMUSCULAR | Status: DC
  Administered 2021-09-20 – 2021-09-25 (×10): 40 mg via INTRAVENOUS
  Filled 2021-09-20 (×10): qty 4

## 2021-09-20 NOTE — Progress Notes (Signed)
PROGRESS NOTE    Terri Casey  NGE:[id number] DOB: 1960/07/01 DOA: 09/11/2021 PCP: [name], NP    Brief Narrative:  61 year old female with past medical history of COPD currently smoking, diastolic heart failure, diabetes mellitus, hypertension, hypothyroidism, paroxysmal atrial fibrillation, obstructive sleep apnea presented to hospital with unresponsiveness and agonal breathing.  She was found on the floor tipped over in a wheelchair in respiratory distress.  Patient initially was put on BiPAP but failed BiPAP so was intubated in the ED and was admitted to ICU for further evaluation.  Patient was subsequently extubated on  9/13.  [facility] assumed care on 9/14.   Assessment and Plan:  Principal Problem:   Septic shock ([place]) Active Problems:   Acute hypercapnic respiratory failure (HCC)   Chronic diastolic CHF (congestive heart failure) (HCC)   AF (paroxysmal atrial fibrillation) (HCC)   Essential hypertension, benign   Hyperlipidemia   Hypothyroidism   PNA (pneumonia)   Acute hypoxemic respiratory failure (HCC)   Respiratory failure (HCC)   Acute metabolic encephalopathy secondary to sepsis Septic shock secondary to aspiration, possible staph epidermidis bacteremia Patient was initially on Levophed which has been discontinued.  Methicillin-resistant staph epidermidis in 1 out of 3 cultures bottles.  Repeat  blood cultures negative to date.  Continue vancomycin and Rocephin.   Acute on chronic hypoxemic hypercapnic respiratory failure  Aspiration pneumonia COPD Active smoker Left-sided pleural effusion shown on x-ray on 09/16/2021 s/p thoracentesis by PCCM on 09/18/2021 CT Chest with left mediastinal shift with fluid/debri in left main bronchus.  Patient was initially intubated on 9/11 and was extubated 9/13 and is currently on BiPAP at nighttime and supplemental oxygen during the daytime.  Chest x-ray with a left pleural effusion and pulmonary edema.  PCCM on  board.  Currently on DuoNebs, Pulmicort, Brovana nebulizer, flutter valve.  Continue pulmonary hygiene.CT chest without contrast showed small bilateral pleural effusions and associated atelectasis or consolidation particularly on the right.  Transudative effusion.  Continue broad-spectrum antibiotic and continue diuresis as per pulmonary.  on high flow nasal cannula at 9 L/min.  Increased from 6 L yesterday.  We will continue to monitor closely.  We will give 1 dose of IV Lasix today.  This is in AM.   Diarrhea-improved   Paroxysmal atrial fibrillation Metoprolol on hold.  Xarelto has been restarted.   Chronic diastolic heart failure, likely acute Hypertension Chest x-ray with pulmonary edema, left-sided pleural effusion.  2D Echo showed EF of 60 to 65%, no regional wall motion abnormality, grade 1 diastolic dysfunction status post thoracocentesis.  Amlodipine and Lasix on hold.  We will give 1 dose of IV Lasix today.   Hypokalemia Potassium was 3.7 today.  We will monitor.  Check BMP in AM.   Macrocytic Anemia  Hemoglobin of 8.4.  Continue to monitor.  Fracture of 4th and 5th metatarsal  Hx fall in August Patient was followed by orthopedics as outpatient.     Hypothyroidism Synthroid   Hx migraines  imitrex and topamax on hold at this time.   Chronic pain Continue Percocet, hold flexeril   Depression Continue lexapro   Obesity Body mass index is 35.58 kg/m.  Would benefit from weight loss as outpatient   Tobacco abuse Reluctant to quit    DVT prophylaxis: SCDs Start: 09/11/21 1709 rivaroxaban (XARELTO) tablet 20 mg   Code Status:     Code Status: Full Code  Disposition: Home health as per PT evaluation.  Status is: Inpatient Remains  inpatient appropriate because: Pending clinical improvement, still on high flow nasal cannula oxygen at 9 L/min, pulmonary follow-up,   Family Communication: None at bedside.  Consultants:  PCCM  Procedures:  Intubation and  mechanical ventilation Thoracocentesis BiPAP  Antimicrobials:  Vancomycin and Rocephin  Anti-infectives (From admission, onward)    Start     Dose/Rate Route Frequency Ordered Stop   09/17/21 1215  vancomycin (VANCOREADY) IVPB 1500 mg/300 mL        1,500 mg 150 mL/hr over 120 Minutes Intravenous Every 24 hours 09/17/21 1125     09/14/21 1800  vancomycin (VANCOREADY) IVPB 1250 mg/250 mL  Status:  Discontinued        1,250 mg 166.7 mL/hr over 90 Minutes Intravenous Every 12 hours 09/14/21 1621 09/17/21 1125   09/13/21 1130  cefTRIAXone (ROCEPHIN) 2 g in sodium chloride 0.9 % 100 mL IVPB        2 g 200 mL/hr over 30 Minutes Intravenous Every 24 hours 09/13/21 1044     09/13/21 0300  vancomycin (VANCOCIN) IVPB 1000 mg/200 mL premix  Status:  Discontinued        1,000 mg 200 mL/hr over 60 Minutes Intravenous Every 24 hours 09/12/21 0007 09/12/21 0825   09/13/21 0300  vancomycin (VANCOREADY) IVPB 1250 mg/250 mL  Status:  Discontinued        1,250 mg 166.7 mL/hr over 90 Minutes Intravenous Every 24 hours 09/12/21 0825 09/14/21 1621   09/12/21 0100  vancomycin (VANCOREADY) IVPB 2000 mg/400 mL        2,000 mg 200 mL/hr over 120 Minutes Intravenous  Once 09/12/21 0007 09/12/21 0340   09/11/21 2000  piperacillin-tazobactam (ZOSYN) IVPB 3.375 g  Status:  Discontinued        3.375 g 12.5 mL/hr over 240 Minutes Intravenous Every 8 hours 09/11/21 1312 09/13/21 1044   09/11/21 1300  piperacillin-tazobactam (ZOSYN) IVPB 3.375 g        3.375 g 100 mL/hr over 30 Minutes Intravenous  Once 09/11/21 1258 09/11/21 1407   09/11/21 0545  cefTRIAXone (ROCEPHIN) 2 g in sodium chloride 0.9 % 100 mL IVPB        2 g 200 mL/hr over 30 Minutes Intravenous  Once 09/11/21 0536 09/11/21 0654   09/11/21 0545  azithromycin (ZITHROMAX) 500 mg in sodium chloride 0.9 % 250 mL IVPB  Status:  Discontinued        500 mg 250 mL/hr over 60 Minutes Intravenous Every 24 hours 09/11/21 0536 09/11/21 1250       Subjective: Today, patient was seen and examined at bedside.  States that she feels a little better and has some pain on the back.  Denies any phlegm production but has mild cough.  No nausea vomiting.  Objective: Vitals:   09/20/21 0013 09/20/21 0357 09/20/21 0814 09/20/21 0827  BP:  118/70  132/67  Pulse:  80 (!) 106   Resp:  20 (!) 22   Temp:  (!) 97.5 F (36.4 C)    TempSrc:      SpO2:  98% 92%   Weight: 109.3 kg     Height:        Intake/Output Summary (Last 24 hours) at 09/20/2021 1327 Last data filed at 09/20/2021 0500 Gross per 24 hour  Intake 990 ml  Output 1325 ml  Net -335 ml   Filed Weights   09/18/21 0010 09/19/21 0434 09/20/21 0013  Weight: 114.1 kg 110.4 kg 109.3 kg    Physical Examination: Body mass index is  35.58 kg/m.  General: Obese built, not in obvious distress, on high flow nasal cannula oxygen, appears older than the stated history of HENT:   No scleral pallor or icterus noted. Oral mucosa is moist.  Chest:  .  Diminished breath sounds bilaterally.  Coarse breath sounds noted. CVS: S1 &S2 heard. No murmur.  Regular rate and rhythm. Abdomen: Soft, nontender, nondistended.  Bowel sounds are heard.   Extremities: No cyanosis, clubbing or edema.  Peripheral pulses are palpable. Psych: Alert, awake and oriented, normal mood CNS:  No cranial nerve deficits.  Power equal in all extremities.   Skin: Warm and dry.  No rashes noted.  Data Reviewed:   CBC: Recent Labs  Lab 09/16/21 0650 09/17/21 0500 09/18/21 0425 09/19/21 0834 09/20/21 0518  WBC 4.9 5.2 5.0 4.2 3.7*  NEUTROABS  --  3.2 2.7 1.9 1.5*  HGB 9.5* 8.8* 8.9* 8.9* 8.4*  HCT 33.3* 29.9* 30.4* 30.5* 28.3*  MCV 110.3* 106.8* 105.9* 106.3* 106.8*  PLT 121* 120* 143* 162 761    Basic Metabolic Panel: Recent Labs  Lab 09/14/21 0400 09/15/21 0500 09/16/21 0650 09/17/21 0500 09/18/21 0425 09/19/21 0834 09/20/21 0518  NA 145   < > 141 139 142 141 143  K 3.9   < > 4.4 3.7 3.2* 3.6  3.7  CL 103   < > 99 95* 92* 90* 91*  CO2 33*   < > 36* 39* 44* 43* 43*  GLUCOSE 95   < > 92 84 86 93 83  BUN 11   < > 6* <5* <5* <5* 5*  CREATININE 0.79   < > 0.67 0.65 0.60 0.55 0.75  CALCIUM 8.3*   < > 8.3* 8.4* 8.6* 8.6* 8.9  MG 2.3  --   --   --  1.9  --   --   PHOS 4.2  --   --   --   --   --   --    < > = values in this interval not displayed.    Liver Function Tests: No results for input(s): "AST", "ALT", "ALKPHOS", "BILITOT", "PROT", "ALBUMIN" in the last 168 hours.   Radiology Studies: DG CHEST PORT 1 VIEW  Result Date: 09/20/2021 CLINICAL DATA:  Pleural effusion.  Follow-up. EXAM: PORTABLE CHEST 1 VIEW COMPARISON:  09/19/2021 FINDINGS: Right internal jugular central line tip unchanged in the SVC. Small bilateral pleural effusions seen by previous CT are not specifically visible on this chest radiograph. There may have resolved or be sub pulmonic. Mild atelectasis at the lung bases, improving. IMPRESSION: Improving atelectasis at the lung bases. No visible pleural fluid. Effusions may be resolved or sub pulmonic. Electronically Signed   By: Nelson Chimes M.D.   On: 09/20/2021 08:20   DG Chest Port 1 View  Result Date: 09/19/2021 CLINICAL DATA:  Shortness of breath. EXAM: PORTABLE CHEST 1 VIEW COMPARISON:  September 18, 2021. FINDINGS: Stable cardiomediastinal silhouette. Right internal jugular catheter is unchanged. Mildly increased bibasilar opacities are noted concerning for infiltrates or atelectasis. Small pleural effusions may be present. Bony thorax is unremarkable. IMPRESSION: Increased bibasilar opacities as described above. Electronically Signed   By: Marijo Conception M.D.   On: 09/19/2021 09:42   DG CHEST PORT 1 VIEW  Result Date: 09/18/2021 CLINICAL DATA:  Follow-up thoracentesis EXAM: PORTABLE CHEST 1 VIEW COMPARISON:  09/16/2021 FINDINGS: Right internal jugular central line tip in the SVC at the azygos level. Less pleural fluid present on the left. No pneumothorax.  Mild  atelectasis at both lung bases. Emphysematous change in the upper lungs. IMPRESSION: Status post left thoracentesis. Much less pleural fluid on the left. No pneumothorax. Better pulmonary aeration. Electronically Signed   By: Nelson Chimes M.D.   On: 09/18/2021 14:50      LOS: 9 days    Flora Lipps, MD [facility] Available via Epic secure chat 7am-7pm After these hours, please refer to coverage provider listed on [email or web address] 09/20/2021, 1:27 PM

## 2021-09-20 NOTE — Progress Notes (Signed)
Physical Therapy Treatment Patient Details Name: Terri Casey MRN: [id number] DOB: [birth year] Today's Date: 09/20/2021   History of Present Illness Pt is a 61 y.o. female admitted 09/11/21 after found floor tipped over in w/c with agonal breathing, unresponsiveness. Intubated in ED; ETT 9/11-9/13. Workup for acute encephalopathy, sepsis, acute hypoxemic respiratory failure due to severe COPD and aspiration PNA. S/p thoracentesis 9/18. PMH includes recent L foot fx (4-5th metatarsals; WB through heel in cam boot), CHF, COPD, HTN, DM.    PT Comments    Pt was seen for progression of movement with pt working on standing on side of bed and making progress with her self care.  Pt is standing for up to 3 minutes with RW and maintaining L heel pressure only, safely making transition to sit.  Pt is not dropping below 90% with sats in standing but is on 9 L O2 on wall and tank air.  Follow along with her for gait and transfers, using strengthening as tolerated and encouraging to sit OOB in chair for longer blocks of time.  Pt is recommended for HHPT to follow up with her needs of standing endurance and proximal stability to reduce fall risk and increase ability to live more independently.   Recommendations for follow up therapy are one component of a multi-disciplinary discharge planning process, led by the attending physician.  Recommendations may be updated based on patient status, additional functional criteria and insurance authorization.  Follow Up Recommendations  Home health PT     Assistance Recommended at Discharge Intermittent Supervision/Assistance  Patient can return home with the following A little help with walking and/or transfers;A little help with bathing/dressing/bathroom;Assistance with cooking/housework;Direct supervision/assist for medications management;Direct supervision/assist for financial management;Assist for transportation;Help with stairs or ramp for entrance   Equipment  Recommendations  Rolling walker (2 wheels);Wheelchair (measurements PT);Wheelchair cushion (measurements PT)    Recommendations for Other Services       Precautions / Restrictions Precautions Precautions: Fall;Other (comment) Precaution Comments: watch O2 sats Restrictions Weight Bearing Restrictions: Yes LLE Weight Bearing: Partial weight bearing LLE Partial Weight Bearing Percentage or Pounds: WB through heel only Other Position/Activity Restrictions: WB through heel in L foot CAM boot     Mobility  Bed Mobility Overal bed mobility: Modified Independent                  Transfers Overall transfer level: Needs assistance Equipment used: Rolling walker (2 wheels) Transfers: Sit to/from Stand Sit to Stand: Min guard                Ambulation/Gait Ambulation/Gait assistance: Counsellor (Feet): 4 Feet Assistive device: Rolling walker (2 wheels) Gait Pattern/deviations: Step-to pattern           Stairs             Wheelchair Mobility    Modified Rankin (Stroke Patients Only)       Balance Overall balance assessment: Needs assistance Sitting-balance support: Feet supported Sitting balance-Leahy Scale: Good   Postural control: Posterior lean Standing balance support: Bilateral upper extremity supported, During functional activity Standing balance-Leahy Scale: Poor                              Cognition Arousal/Alertness: Awake/alert Behavior During Therapy: WFL for tasks assessed/performed Overall Cognitive Status: No family/caregiver present to determine baseline cognitive functioning Area of Impairment: Following commands, Attention  Current Attention Level: Sustained, Selective Memory: Decreased short-term memory Following Commands: Follows one step commands with increased time Safety/Judgement: Decreased awareness of deficits   Problem Solving: Requires verbal cues           Exercises      General Comments General comments (skin integrity, edema, etc.): pt was noted to be in bed with pt as is her habit, lying on R side.  Talked with pt about her tangled hair and was able to use the rest time from standing to work on hair tangles with pt      Pertinent Vitals/Pain Pain Assessment Pain Assessment: Faces Faces Pain Scale: No hurt    Home Living                          Prior Function            PT Goals (current goals can now be found in the care plan section) Acute Rehab PT Goals Patient Stated Goal: to be independent again Progress towards PT goals: Progressing toward goals    Frequency    Min 3X/week      PT Plan Current plan remains appropriate    Co-evaluation              AM-PAC PT "6 Clicks" Mobility   Outcome Measure  Help needed turning from your back to your side while in a flat bed without using bedrails?: None Help needed moving from lying on your back to sitting on the side of a flat bed without using bedrails?: None Help needed moving to and from a bed to a chair (including a wheelchair)?: A Little Help needed standing up from a chair using your arms (e.g., wheelchair or bedside chair)?: A Little Help needed to walk in hospital room?: A Little Help needed climbing 3-5 steps with a railing? : Total 6 Click Score: 18    End of Session Equipment Utilized During Treatment: Oxygen Activity Tolerance: Patient tolerated treatment well;Patient limited by fatigue Patient left: in bed;with call bell/phone within reach;with bed alarm set Nurse Communication: Mobility status PT Visit Diagnosis: Unsteadiness on feet (R26.81);Muscle weakness (generalized) (M62.81)     Time: 9169-4503 PT Time Calculation (min) (ACUTE ONLY): 32 min  Charges:  $Therapeutic Activity: 23-37 mins    Ramond Dial 09/20/2021, 4:31 PM  Mee Hives, PT PhD Acute Rehab Dept. Number: [place] and [place]

## 2021-09-21 DIAGNOSIS — J9601 Acute respiratory failure with hypoxia: Secondary | ICD-10-CM | POA: Diagnosis not present

## 2021-09-21 DIAGNOSIS — J9602 Acute respiratory failure with hypercapnia: Secondary | ICD-10-CM | POA: Diagnosis not present

## 2021-09-21 DIAGNOSIS — I480 Paroxysmal atrial fibrillation: Secondary | ICD-10-CM | POA: Diagnosis not present

## 2021-09-21 DIAGNOSIS — A419 Sepsis, unspecified organism: Secondary | ICD-10-CM | POA: Diagnosis not present

## 2021-09-21 LAB — CBC
HCT: 28.1 % — ABNORMAL LOW (ref 36.0–46.0)
Hemoglobin: 8.3 g/dL — ABNORMAL LOW (ref 12.0–15.0)
MCH: 31.9 pg (ref 26.0–34.0)
MCHC: 29.5 g/dL — ABNORMAL LOW (ref 30.0–36.0)
MCV: 108.1 fL — ABNORMAL HIGH (ref 80.0–100.0)
Platelets: 157 10*3/uL (ref 150–400)
RBC: 2.6 MIL/uL — ABNORMAL LOW (ref 3.87–5.11)
RDW: 14.8 % (ref 11.5–15.5)
WBC: 4.1 10*3/uL (ref 4.0–10.5)
nRBC: 0 % (ref 0.0–0.2)

## 2021-09-21 LAB — GLUCOSE, CAPILLARY
Glucose-Capillary: 90 mg/dL (ref 70–99)
Glucose-Capillary: 86 mg/dL (ref 70–99)
Glucose-Capillary: 100 mg/dL — ABNORMAL HIGH (ref 70–99)
Glucose-Capillary: 107 mg/dL — ABNORMAL HIGH (ref 70–99)
Glucose-Capillary: 128 mg/dL — ABNORMAL HIGH (ref 70–99)

## 2021-09-21 LAB — BODY FLUID CULTURE W GRAM STAIN: Culture: NO GROWTH

## 2021-09-21 LAB — BASIC METABOLIC PANEL
Anion gap: 5 (ref 5–15)
BUN: 7 mg/dL — ABNORMAL LOW (ref 8–23)
CO2: 44 mmol/L — ABNORMAL HIGH (ref 22–32)
Calcium: 8.6 mg/dL — ABNORMAL LOW (ref 8.9–10.3)
Chloride: 92 mmol/L — ABNORMAL LOW (ref 98–111)
Creatinine, Ser: 0.67 mg/dL (ref 0.44–1.00)
GFR, Estimated: >60 mL/min (ref >60)
Glucose, Bld: 92 mg/dL (ref 70–99)
Potassium: 3.6 mmol/L (ref 3.5–5.1)
Sodium: 141 mmol/L (ref 135–145)

## 2021-09-21 LAB — MAGNESIUM: Magnesium: 1.9 mg/dL (ref 1.7–2.4)

## 2021-09-21 LAB — VANCOMYCIN, RANDOM: Vancomycin Rm: 14 ug/mL

## 2021-09-21 NOTE — Progress Notes (Signed)
Occupational Therapy Treatment Patient Details Name: Terri Casey MRN: [id number] DOB: Feb 15, 1960 Today's Date: 09/21/2021   History of present illness Pt is a 61 y.o. female admitted 09/11/21 after found floor tipped over in w/c with agonal breathing, unresponsiveness. Intubated in ED; ETT 9/11-9/13. Workup for acute encephalopathy, sepsis, acute hypoxemic respiratory failure due to severe COPD and aspiration PNA. S/p thoracentesis 9/18. PMH includes recent L foot fx (4-5th metatarsals; WB through heel in cam boot), CHF, COPD, HTN, DM.   OT comments  Pt completed selfcare tasks and toileting during session.  Min assist for transfer to the [facility] with use of the RW and mod instructional cueing to adhere to weightbearing only through the left heel.  Mod assist for LB bathing and dressing sit to stand.  Feel she will continue to benefit from acute care OT to help progression to supervision level overall for return home with family as long as family is willing to provide 24 hr supervision for safety.     Recommendations for follow up therapy are one component of a multi-disciplinary discharge planning process, led by the attending physician.  Recommendations may be updated based on patient status, additional functional criteria and insurance authorization.    Follow Up Recommendations  Home health OT    Assistance Recommended at Discharge Frequent or constant Supervision/Assistance  Patient can return home with the following  A little help with walking and/or transfers;Assistance with cooking/housework;Help with stairs or ramp for entrance;Assist for transportation;Direct supervision/assist for financial management;Direct supervision/assist for medications management;A little help with bathing/dressing/bathroom   Equipment Recommendations  None recommended by OT       Precautions / Restrictions Precautions Precautions: Fall;Other (comment) Precaution Comments: watch O2 sats Required Braces  or Orthoses: Other Brace Other Brace: L foot cam boot Restrictions Weight Bearing Restrictions: Yes LLE Weight Bearing: Partial weight bearing LLE Partial Weight Bearing Percentage or Pounds: WB through heel only       Mobility Bed Mobility   Bed Mobility: Supine to Sit     Supine to sit: Supervision Sit to supine: Supervision        Transfers Overall transfer level: Needs assistance Equipment used: Rolling walker (2 wheels) Transfers: Sit to/from Stand Sit to Stand: Min assist     Step pivot transfers: Min assist     General transfer comment: Mod instructional cueing to keep weight on the left heel in standing and during transfer and for reaching back to sit for control of descent.     Balance Overall balance assessment: Needs assistance Sitting-balance support: Feet supported Sitting balance-Leahy Scale: Good     Standing balance support: Bilateral upper extremity supported, During functional activity Standing balance-Leahy Scale: Poor Standing balance comment: Pt needs UE support to maintain weightbearing.                           ADL either performed or assessed with clinical judgement   ADL Overall ADL's : Needs assistance/impaired     Grooming: Wash/dry face;Set up;Sitting       Lower Body Bathing: Minimal assistance;Moderate assistance       Lower Body Dressing: Total assistance Lower Body Dressing Details (indicate cue type and reason): for doffing and donning cam boot to wash the foot Toilet Transfer: Minimal assistance;BSC/3in1;Stand-pivot;Rolling walker (2 wheels)   Toileting- Clothing Manipulation and Hygiene: Minimal assistance;Sit to/from stand       Functional mobility during ADLs: Minimal assistance;Rolling walker (2 wheels);Cueing for safety;Cueing for sequencing (  stand pivot to the 3:1 with use of the RW) General ADL Comments: Assist needed to wash the right foot and remove cam boot.  Pt reports her aide helps her with  bathing at home and she usually doesn't do it, however she did attempt with therapist.  Oxygen sats at 92-93% at rest on 8Ls high flow.  They decreased down to 86% with bathing tasks in sitting.               Cognition Arousal/Alertness: Awake/alert Behavior During Therapy: WFL for tasks assessed/performed Overall Cognitive Status: Impaired/Different from baseline Area of Impairment: Awareness, Safety/judgement                 Orientation Level: Situation Current Attention Level: Sustained Memory: Decreased short-term memory Following Commands: Follows one step commands consistently Safety/Judgement: Decreased awareness of deficits Awareness: Anticipatory Problem Solving: Requires verbal cues General Comments: Pt needs mod instructional cueing to maintain weightbearing through the heel only during session.                   Pertinent Vitals/ Pain       Pain Assessment Pain Assessment: Faces Faces Pain Scale: Hurts little more Pain Descriptors / Indicators: Headache Pain Intervention(s): Repositioned, Limited activity within patient's tolerance         Frequency  Min 2X/week        Progress Toward Goals  OT Goals(current goals can now be found in the care plan section)  Progress towards OT goals: Progressing toward goals  Acute Rehab OT Goals Patient Stated Goal: Pt wants to go home and not to SNF OT Goal Formulation: With patient Time For Goal Achievement: 09/29/21 Potential to Achieve Goals: Good  Plan Frequency remains appropriate;Discharge plan remains appropriate       AM-PAC OT "6 Clicks" Daily Activity     Outcome Measure   Help from another person eating meals?: None Help from another person taking care of personal grooming?: A Little Help from another person toileting, which includes using toliet, bedpan, or urinal?: A Little Help from another person bathing (including washing, rinsing, drying)?: A Little Help from another person to put  on and taking off regular upper body clothing?: None Help from another person to put on and taking off regular lower body clothing?: A Little 6 Click Score: 20    End of Session Equipment Utilized During Treatment: Rolling walker (2 wheels);Oxygen  OT Visit Diagnosis: History of falling (Z91.81);Muscle weakness (generalized) (M62.81)   Activity Tolerance Patient tolerated treatment well   Patient Left in bed;with call bell/phone within reach;with bed alarm set   Nurse Communication Mobility status        Time: 1500-1535 OT Time Calculation (min): 35 min  Charges: OT General Charges $OT Visit: 1 Visit OT Treatments $Self Care/Home Management : 23-37 mins  Hance Caspers OTR/L 09/21/2021, 3:53 PM

## 2021-09-21 NOTE — TOC Progression Note (Addendum)
Transition of Care [facility]) - Progression Note    Patient Details  Name: AMBRIEL GORELICK MRN: [id number] Date of Birth: [birth year]  Transition of Care [facility]) CM/SW Contact  Zenon Mayo, RN Phone Number: 09/21/2021, 3:43 PM  Clinical Narrative:    NCM was informed that patient's son and his wife wants patient to go to a facility to get rehab, NCM spoke with patient to see if this is what she wants to do, she states no , she wants to go home with either [facility] or outpatient therapy, Patient gave NCM permission to speak with son.  NCM spoke with son and then he handed the phone to his wife. She is the patient's aide for  [facility] and she is there 24 hrs every day , because she lives there.   NCM has been working on getting [facility] but no agency has staff in [place], so NCM has set up patient with outpatient therapy at [facility]  Also NCM checked back  with [facility] with [name] , he states he will check to see if he will have availability in [place] and get back with me.  Awaiting call back.   9/22- NCM received call back from [name], he states he spoke with his director , they are able to take referral for [place], [place], [place] with soc on Monday.  NCM asked MD for [facility] orders.  Patient conts on 8-9 liters oxygen today.  TOC following.    Expected Discharge Plan: [place] Barriers to Discharge: Continued Medical Work up  Expected Discharge Plan and Services Expected Discharge Plan: [place]   Discharge Planning Services: CM Consult Post Acute Care Choice: [place] arrangements for the past 2 months: Single Family Home                   DME Agency: NA                   Social Determinants of Health (SDOH) Interventions    Readmission Risk Interventions    09/14/2021    3:21 PM 03/21/2021   10:52 AM 03/20/2021   12:14 PM  Readmission Risk Prevention Plan  Post Dischage Appt  Complete   Medication Screening   Complete Complete  Transportation Screening  Complete Complete  PCP or Specialist Appt within 3-5 Days Complete    HRI or [place] Complete    Social Work Consult for [place] Planning/Counseling Complete    Palliative Care Screening Not Applicable    Medication Review Press photographer) Complete

## 2021-09-21 NOTE — Progress Notes (Signed)
Pharmacy Antibiotic Note- follow-up  Terri Casey is a 61 y.o. female admitted on 09/11/2021 with staph epi bacteremia. Pharmacy consulted to dose vancomycin. Currently on vancomycin 1500 mg iv q24h   Level assessment: Vanc peak 37 on 9/20 15:15 Vanco random 14 on 9/21 05:51  AUC 518 mcg*hr/mL- therapeutic   Plan: Continue vancomycin 1500 mg Q24 hours Continue Ceftriaxone 2 grams IV Q 24 hours Follow-up levels weekly   Height: '5\' 9"'$  (175.3 cm) Weight: 110 kg (242 lb 8.1 oz) IBW/kg (Calculated) : 66.2  Temp (24hrs), Avg:98 F (36.7 C), Min:98 F (36.7 C), Max:98 F (36.7 C)  Recent Labs  Lab 09/16/21 0916 09/17/21 0500 09/17/21 0820 09/18/21 0425 09/19/21 0834 09/20/21 0518 09/20/21 1515 09/21/21 0551  WBC  --  5.2  --  5.0 4.2 3.7*  --  4.1  CREATININE  --  0.65  --  0.60 0.55 0.75  --  0.67  VANCOTROUGH  --  23*  --   --   --   --   --   --   VANCOPEAK 36  --   --   --   --   --  37  --   VANCORANDOM  --   --  21  --   --   --   --  14    Estimated Creatinine Clearance: 96.3 mL/min (by C-G formula based on SCr of 0.67 mg/dL).    Allergies  Allergen Reactions   Estrogens Hives   Mustard Seed Hives   Strawberry Extract Hives    Eun Vermeer BS, PharmD, BCPS Clinical Pharmacist 09/21/2021 7:40 AM  Contact: [phone] after 3 PM  "Be curious, not judgmental..." -Jamal Maes

## 2021-09-21 NOTE — Progress Notes (Addendum)
PROGRESS NOTE    Terri Casey  LOV:[id number] DOB: Nov 29, 1960 DOA: 09/11/2021 PCP: [name], NP    Brief Narrative:  61 year old female with past medical history of COPD currently smoking, diastolic heart failure, diabetes mellitus, hypertension, hypothyroidism, paroxysmal atrial fibrillation, obstructive sleep apnea presented to hospital with unresponsiveness and agonal breathing.  She was found on the floor tipped over in a wheelchair in respiratory distress.  Patient initially was put on BiPAP but failed BiPAP so was intubated in the ED and was admitted to ICU for further evaluation.  Patient was subsequently extubated on  9/13.  [facility] hospitalist assumed care on 09/14/21.   Assessment and Plan:  Principal Problem:   Septic shock ([place]) Active Problems:   Acute hypercapnic respiratory failure (HCC)   Chronic diastolic CHF (congestive heart failure) (HCC)   AF (paroxysmal atrial fibrillation) (HCC)   Essential hypertension, benign   Hyperlipidemia   Hypothyroidism   PNA (pneumonia)   Acute hypoxemic respiratory failure (HCC)   Respiratory failure (HCC)   Acute metabolic encephalopathy secondary to sepsis Septic shock secondary to aspiration, possible staph epidermidis bacteremia Patient was initially on Levophed which has been discontinued.  Methicillin-resistant staph epidermidis in 1 out of 3 cultures bottles.  Repeat  blood cultures negative to date.  Currently on vancomycin and Rocephin.   Acute on chronic hypoxemic hypercapnic respiratory failure  Aspiration pneumonia COPD Active smoker Left-sided pleural effusion shown on x-ray on 09/16/2021 s/p thoracentesis by PCCM on 09/18/2021 CT Chest with left mediastinal shift with fluid/debri in left main bronchus.  Patient was initially intubated on 9/11 and was extubated 9/13 and is currently on BiPAP at nighttime and supplemental oxygen during the daytime.  Chest x-ray with a left pleural effusion and pulmonary edema.   PCCM on board recommend continuation of current regimen..  Currently on DuoNebs, Pulmicort, Brovana nebulizer, flutter valve.  Continue pulmonary hygiene.CT chest without contrast showed small bilateral pleural effusions and associated atelectasis or consolidation particularly on the right.  Transudative effusion.  Continue broad-spectrum antibiotic and continue diuresis as per pulmonary.  On high flow nasal cannula at 7 L/min.  Patient is usually around 2 to 3 L at home.  We will continue to monitor closely.  Been started on IV Lasix twice a day for now.  We will continue to monitor closely.  We will continue to monitor creatinine intake and output charting.  Is still positive for balance for 197 3 mL but had around 1100 mL urinary output.  Diarrhea-improved   Paroxysmal atrial fibrillation Metoprolol on hold.  Xarelto has been restarted.   Chronic diastolic heart failure, likely acute Hypertension Chest x-ray with pulmonary edema, left-sided pleural effusion.  2D Echo showed EF of 60 to 65%, no regional wall motion abnormality, grade 1 diastolic dysfunction status post thoracocentesis.  Amlodipine on hold.  Has been started on Lasix 40 twice daily.   Hypokalemia Potassium was 3.7 today.  We will monitor.  Check BMP in AM.   Macrocytic Anemia  Hemoglobin of 8.4.  Continue to monitor.  Fracture of 4th and 5th metatarsal, Hx fall in August Patient was followed by orthopedics as outpatient.     Hypothyroidism Synthroid   Hx migraines  imitrex and topamax on hold at this time.   Chronic pain Continue Percocet, hold flexeril   Depression Continue lexapro   Obesity Body mass index is 35.81 kg/m.  Would benefit from weight loss as outpatient   Tobacco abuse Reluctant to quit  DVT prophylaxis: SCDs Start: 09/11/21 1709 rivaroxaban (XARELTO) tablet 20 mg   Code Status:     Code Status: Full Code  Disposition: Home health as per PT evaluation in 1 to 2 days per PT evaluation but  family wishes skilled nursing facility..   Status is: Inpatient  Remains inpatient appropriate because: Pending clinical improvement, still on high flow nasal cannula oxygen at 7 L/min, pulmonary follow-up,   Family Communication: None at bedside.  Spoke with the patient's son and daughter-in-law on the phone today.  They wish the patient to go to short-term rehabilitation prior to coming to the hospital.  We will consult TOC for this.  Consultants:  PCCM  Procedures:  Intubation and mechanical ventilation Thoracocentesis BiPAP  Antimicrobials:  Vancomycin and Rocephin IV.  Anti-infectives (From admission, onward)    Start     Dose/Rate Route Frequency Ordered Stop   09/17/21 1215  vancomycin (VANCOREADY) IVPB 1500 mg/300 mL        1,500 mg 150 mL/hr over 120 Minutes Intravenous Every 24 hours 09/17/21 1125     09/14/21 1800  vancomycin (VANCOREADY) IVPB 1250 mg/250 mL  Status:  Discontinued        1,250 mg 166.7 mL/hr over 90 Minutes Intravenous Every 12 hours 09/14/21 1621 09/17/21 1125   09/13/21 1130  cefTRIAXone (ROCEPHIN) 2 g in sodium chloride 0.9 % 100 mL IVPB        2 g 200 mL/hr over 30 Minutes Intravenous Every 24 hours 09/13/21 1044     09/13/21 0300  vancomycin (VANCOCIN) IVPB 1000 mg/200 mL premix  Status:  Discontinued        1,000 mg 200 mL/hr over 60 Minutes Intravenous Every 24 hours 09/12/21 0007 09/12/21 0825   09/13/21 0300  vancomycin (VANCOREADY) IVPB 1250 mg/250 mL  Status:  Discontinued        1,250 mg 166.7 mL/hr over 90 Minutes Intravenous Every 24 hours 09/12/21 0825 09/14/21 1621   09/12/21 0100  vancomycin (VANCOREADY) IVPB 2000 mg/400 mL        2,000 mg 200 mL/hr over 120 Minutes Intravenous  Once 09/12/21 0007 09/12/21 0340   09/11/21 2000  piperacillin-tazobactam (ZOSYN) IVPB 3.375 g  Status:  Discontinued        3.375 g 12.5 mL/hr over 240 Minutes Intravenous Every 8 hours 09/11/21 1312 09/13/21 1044   09/11/21 1300  piperacillin-tazobactam  (ZOSYN) IVPB 3.375 g        3.375 g 100 mL/hr over 30 Minutes Intravenous  Once 09/11/21 1258 09/11/21 1407   09/11/21 0545  cefTRIAXone (ROCEPHIN) 2 g in sodium chloride 0.9 % 100 mL IVPB        2 g 200 mL/hr over 30 Minutes Intravenous  Once 09/11/21 0536 09/11/21 0654   09/11/21 0545  azithromycin (ZITHROMAX) 500 mg in sodium chloride 0.9 % 250 mL IVPB  Status:  Discontinued        500 mg 250 mL/hr over 60 Minutes Intravenous Every 24 hours 09/11/21 0536 09/11/21 1250      Subjective: Today, patient was seen and examined at bedside.  Complains of back pain and some cough.  Denies any dyspnea or shortness of breath.  Still requiring 7 L of oxygen by nasal cannula.  Objective: Vitals:   09/20/21 2145 09/21/21 0455 09/21/21 0733 09/21/21 0817  BP:  (!) 100/50  134/75  Pulse:  78  92  Resp:  15  18  Temp:  98 F (36.7 C)  98.1 F (36.7 C)  TempSrc:  Oral  Oral  SpO2: 100% 100% 95% 91%  Weight:  110 kg    Height:        Intake/Output Summary (Last 24 hours) at 09/21/2021 1301 Last data filed at 09/21/2021 1203 Gross per 24 hour  Intake 240 ml  Output 2301 ml  Net -2061 ml    Filed Weights   09/19/21 0434 09/20/21 0013 09/21/21 0455  Weight: 110.4 kg 109.3 kg 110 kg    Physical Examination: Body mass index is 35.81 kg/m.   General: Obese built, not in obvious distress on high flow nasal cannula,  HENT:   No scleral pallor or icterus noted. Oral mucosa is moist.  Chest:    Diminished breath sounds bilaterally.  CVS: S1 &S2 heard. No murmur.  Regular rate and rhythm. Abdomen: Soft, nontender, nondistended.  Bowel sounds are heard.   Extremities: No cyanosis, clubbing or edema.  Peripheral pulses are palpable. Psych: Alert, awake and oriented, normal mood CNS:  No cranial nerve deficits.  Power equal in all extremities.   Skin: Warm and dry.  No rashes noted.  Data Reviewed:   CBC: Recent Labs  Lab 09/17/21 0500 09/18/21 0425 09/19/21 0834 09/20/21 0518  09/21/21 0551  WBC 5.2 5.0 4.2 3.7* 4.1  NEUTROABS 3.2 2.7 1.9 1.5*  --   HGB 8.8* 8.9* 8.9* 8.4* 8.3*  HCT 29.9* 30.4* 30.5* 28.3* 28.1*  MCV 106.8* 105.9* 106.3* 106.8* 108.1*  PLT 120* 143* 162 157 017    Basic Metabolic Panel: Recent Labs  Lab 09/17/21 0500 09/18/21 0425 09/19/21 0834 09/20/21 0518 09/21/21 0551  NA 139 142 141 143 141  K 3.7 3.2* 3.6 3.7 3.6  CL 95* 92* 90* 91* 92*  CO2 39* 44* 43* 43* 44*  GLUCOSE 84 86 93 83 92  BUN <5* <5* <5* 5* 7*  CREATININE 0.65 0.60 0.55 0.75 0.67  CALCIUM 8.4* 8.6* 8.6* 8.9 8.6*  MG  --  1.9  --   --  1.9    Liver Function Tests: No results for input(s): "AST", "ALT", "ALKPHOS", "BILITOT", "PROT", "ALBUMIN" in the last 168 hours.   Radiology Studies: DG CHEST PORT 1 VIEW  Result Date: 09/20/2021 CLINICAL DATA:  Pleural effusion.  Follow-up. EXAM: PORTABLE CHEST 1 VIEW COMPARISON:  09/19/2021 FINDINGS: Right internal jugular central line tip unchanged in the SVC. Small bilateral pleural effusions seen by previous CT are not specifically visible on this chest radiograph. There may have resolved or be sub pulmonic. Mild atelectasis at the lung bases, improving. IMPRESSION: Improving atelectasis at the lung bases. No visible pleural fluid. Effusions may be resolved or sub pulmonic. Electronically Signed   By: Nelson Chimes M.D.   On: 09/20/2021 08:20      LOS: 10 days    Flora Lipps, MD [facility] Available via Epic secure chat 7am-7pm After these hours, please refer to coverage provider listed on [email or web address] 09/21/2021, 1:01 PM

## 2021-09-21 NOTE — Progress Notes (Signed)
OT Cancellation Note  Patient Details Name: Terri Casey MRN: [id number] DOB: [birth year]   Cancelled Treatment:    Reason Eval/Treat Not Completed: Other (comment) (Pt reporting 8/10 headache pain & asks OT to check back later this afternoon. RN staff notified and going to give meds. Will check back as time/schedule allows.)  Almyra Deforest, OTR/L 09/21/2021, 11:33 AM

## 2021-09-22 ENCOUNTER — Encounter: Payer: Self-pay | Admitting: Orthopedic Surgery

## 2021-09-22 DIAGNOSIS — I480 Paroxysmal atrial fibrillation: Secondary | ICD-10-CM | POA: Diagnosis not present

## 2021-09-22 DIAGNOSIS — J9602 Acute respiratory failure with hypercapnia: Secondary | ICD-10-CM | POA: Diagnosis not present

## 2021-09-22 DIAGNOSIS — J9601 Acute respiratory failure with hypoxia: Secondary | ICD-10-CM | POA: Diagnosis not present

## 2021-09-22 DIAGNOSIS — A419 Sepsis, unspecified organism: Secondary | ICD-10-CM | POA: Diagnosis not present

## 2021-09-22 LAB — BASIC METABOLIC PANEL
BUN: 9 mg/dL (ref 8–23)
CO2: >45 mmol/L — ABNORMAL HIGH (ref 22–32)
Calcium: 8.5 mg/dL — ABNORMAL LOW (ref 8.9–10.3)
Chloride: 87 mmol/L — ABNORMAL LOW (ref 98–111)
Creatinine, Ser: 0.6 mg/dL (ref 0.44–1.00)
GFR, Estimated: >60 mL/min (ref >60)
Glucose, Bld: 130 mg/dL — ABNORMAL HIGH (ref 70–99)
Potassium: 3.2 mmol/L — ABNORMAL LOW (ref 3.5–5.1)
Sodium: 140 mmol/L (ref 135–145)

## 2021-09-22 LAB — GLUCOSE, CAPILLARY
Glucose-Capillary: 100 mg/dL — ABNORMAL HIGH (ref 70–99)
Glucose-Capillary: 110 mg/dL — ABNORMAL HIGH (ref 70–99)
Glucose-Capillary: 127 mg/dL — ABNORMAL HIGH (ref 70–99)
Glucose-Capillary: 79 mg/dL (ref 70–99)
Glucose-Capillary: 90 mg/dL (ref 70–99)
Glucose-Capillary: 94 mg/dL (ref 70–99)

## 2021-09-22 LAB — CBC
HCT: 28.4 % — ABNORMAL LOW (ref 36.0–46.0)
Hemoglobin: 8.4 g/dL — ABNORMAL LOW (ref 12.0–15.0)
MCH: 31.6 pg (ref 26.0–34.0)
MCHC: 29.6 g/dL — ABNORMAL LOW (ref 30.0–36.0)
MCV: 106.8 fL — ABNORMAL HIGH (ref 80.0–100.0)
Platelets: 148 10*3/uL — ABNORMAL LOW (ref 150–400)
RBC: 2.66 MIL/uL — ABNORMAL LOW (ref 3.87–5.11)
RDW: 14.7 % (ref 11.5–15.5)
WBC: 5.3 10*3/uL (ref 4.0–10.5)
nRBC: 0 % (ref 0.0–0.2)

## 2021-09-22 LAB — MAGNESIUM: Magnesium: 2 mg/dL (ref 1.7–2.4)

## 2021-09-22 MED ORDER — POTASSIUM CHLORIDE CRYS ER 20 MEQ PO TBCR
40.0000 meq | EXTENDED_RELEASE_TABLET | Freq: Every day | ORAL | Status: DC
  Administered 2021-09-23 – 2021-09-25 (×3): 40 meq via ORAL
  Filled 2021-09-22 (×3): qty 2

## 2021-09-22 MED ORDER — POTASSIUM CHLORIDE CRYS ER 20 MEQ PO TBCR
40.0000 meq | EXTENDED_RELEASE_TABLET | ORAL | Status: AC
  Administered 2021-09-22 (×2): 40 meq via ORAL
  Filled 2021-09-22 (×2): qty 2

## 2021-09-22 NOTE — Progress Notes (Signed)
PROGRESS NOTE    Terri Casey  HDQ:[id number] DOB: 06-29-1960 DOA: 09/11/2021 PCP: [name], NP    Brief Narrative:   61 year old female with past medical history of COPD currently smoking, diastolic heart failure, diabetes mellitus, hypertension, hypothyroidism, paroxysmal atrial fibrillation, obstructive sleep apnea presented to hospital with unresponsiveness and agonal breathing.  She was found on the floor tipped over in a wheelchair in respiratory distress.  Patient initially was put on BiPAP but failed BiPAP so was intubated in the ED and was admitted to ICU for further evaluation.  Patient was subsequently extubated on  9/13.  [facility] hospitalist assumed care on 09/14/21.   Assessment and Plan:  Principal Problem:   Septic shock ([place]) Active Problems:   Acute hypercapnic respiratory failure (HCC)   Chronic diastolic CHF (congestive heart failure) (HCC)   AF (paroxysmal atrial fibrillation) (HCC)   Essential hypertension, benign   Hyperlipidemia   Hypothyroidism   PNA (pneumonia)   Acute hypoxemic respiratory failure (HCC)   Respiratory failure (HCC)   Acute metabolic encephalopathy secondary to sepsis Septic shock secondary to aspiration, possible staph epidermidis bacteremia Patient was initially on Levophed which has been discontinued.  Methicillin-resistant staph epidermidis in 1 out of 3 cultures bottles.  Repeat  blood cultures negative to date.  She has completed 10 day course of antibiotic with vancomycin and Rocephin.   Acute on chronic hypoxemic hypercapnic respiratory failure  Aspiration pneumonia COPD Active smoker Left-sided pleural effusion shown on x-ray on 09/16/2021 s/p thoracentesis by PCCM on 09/18/2021 CT Chest with left mediastinal shift with fluid/debri in left main bronchus.  Patient was initially intubated on 09/11/21 and was extubated 09/13/21 and is currently on BiPAP at nighttime and supplemental oxygen during the daytime.  Chest x-ray with  left pleural effusion and pulmonary edema.  PCCM followed the patient during hospitalization and has been continued on DuoNebs, Pulmicort, Brovana nebulizer, flutter valve, pulmonary hygiene. CT chest without contrast showed small bilateral pleural effusions and associated atelectasis or consolidation particularly on the right.  Transudative effusion.  Patient is still on cannula at 7 L/min.  Patient is on 2 to 3 L at home.  Continue IV Lasix twice a day.  Patient is positive balance for 3625 mL but output around 1645 mL.  Obtain 2 view chest x-ray tomorrow.  Diarrhea-improved   Paroxysmal atrial fibrillation Metoprolol on hold.  Xarelto has been restarted.   Chronic diastolic heart failure, likely acute Hypertension Chest x-ray with pulmonary edema, left-sided pleural effusion.  2D Echo showed EF of 60 to 65%, no regional wall motion abnormality, grade 1 diastolic dysfunction status post thoracocentesis.  Amlodipine on hold.  Has been started on Lasix '40mg'$  twice daily.  Diuresing but still positive fluid balance.  We will repeat chest x-ray in a.m.   Hypokalemia Potassium was 3.2 today.  Give the 40 mEq x 2 today.  We will put on daily potassium supplements.  Check BMP in AM.   Macrocytic Anemia  Latest hemoglobin of 8.4.  Continue to monitor.  Fracture of 4th and 5th metatarsal, Hx fall in August Patient was followed by orthopedics as outpatient.     Hypothyroidism Synthroid   Hx migraines  imitrex and topamax on hold at this time.   Chronic pain Continue Percocet, hold flexeril   Depression Continue lexapro   Obesity Body mass index is 35.62 kg/m.  Would benefit from weight loss as outpatient   Tobacco abuse Reluctant to quit    DVT prophylaxis:  SCDs Start: 09/11/21 1709 rivaroxaban (XARELTO) tablet 20 mg   Code Status:     Code Status: Full Code  Disposition:  Home health as per PT evaluation but family wishes skilled nursing facility..   Status is:  Inpatient  Remains inpatient appropriate because: Pending clinical improvement, still on high flow nasal cannula oxygen at 7 L/min,   Family Communication:    Spoke with the patient's son and daughter-in-law on 09/21/2021 they wish the patient to go to short-term rehabilitation prior to coming to the hospital.  [facility] consulted.  Consultants:  PCCM  Procedures:  Intubation and mechanical ventilation Thoracocentesis BiPAP  Antimicrobials:  Vancomycin and Rocephin IV-completed course  Anti-infectives (From admission, onward)    Start     Dose/Rate Route Frequency Ordered Stop   09/17/21 1215  vancomycin (VANCOREADY) IVPB 1500 mg/300 mL  Status:  Discontinued        1,500 mg 150 mL/hr over 120 Minutes Intravenous Every 24 hours 09/17/21 1125 09/22/21 0728   09/14/21 1800  vancomycin (VANCOREADY) IVPB 1250 mg/250 mL  Status:  Discontinued        1,250 mg 166.7 mL/hr over 90 Minutes Intravenous Every 12 hours 09/14/21 1621 09/17/21 1125   09/13/21 1130  cefTRIAXone (ROCEPHIN) 2 g in sodium chloride 0.9 % 100 mL IVPB  Status:  Discontinued        2 g 200 mL/hr over 30 Minutes Intravenous Every 24 hours 09/13/21 1044 09/22/21 0728   09/13/21 0300  vancomycin (VANCOCIN) IVPB 1000 mg/200 mL premix  Status:  Discontinued        1,000 mg 200 mL/hr over 60 Minutes Intravenous Every 24 hours 09/12/21 0007 09/12/21 0825   09/13/21 0300  vancomycin (VANCOREADY) IVPB 1250 mg/250 mL  Status:  Discontinued        1,250 mg 166.7 mL/hr over 90 Minutes Intravenous Every 24 hours 09/12/21 0825 09/14/21 1621   09/12/21 0100  vancomycin (VANCOREADY) IVPB 2000 mg/400 mL        2,000 mg 200 mL/hr over 120 Minutes Intravenous  Once 09/12/21 0007 09/12/21 0340   09/11/21 2000  piperacillin-tazobactam (ZOSYN) IVPB 3.375 g  Status:  Discontinued        3.375 g 12.5 mL/hr over 240 Minutes Intravenous Every 8 hours 09/11/21 1312 09/13/21 1044   09/11/21 1300  piperacillin-tazobactam (ZOSYN) IVPB 3.375  g        3.375 g 100 mL/hr over 30 Minutes Intravenous  Once 09/11/21 1258 09/11/21 1407   09/11/21 0545  cefTRIAXone (ROCEPHIN) 2 g in sodium chloride 0.9 % 100 mL IVPB        2 g 200 mL/hr over 30 Minutes Intravenous  Once 09/11/21 0536 09/11/21 0654   09/11/21 0545  azithromycin (ZITHROMAX) 500 mg in sodium chloride 0.9 % 250 mL IVPB  Status:  Discontinued        500 mg 250 mL/hr over 60 Minutes Intravenous Every 24 hours 09/11/21 0536 09/11/21 1250      Subjective: Today, patient was seen and examined at bedside.  Complains of mild left knee pain.  Inquiring about psoriatic rash on her elbow.  Denies overt shortness of breath or dyspnea.  Has mild cough.  Still on 7 L of oxygen  Objective: Vitals:   09/22/21 0404 09/22/21 0750 09/22/21 0801 09/22/21 1200  BP: 98/62  129/60 (!) 106/48  Pulse: 87  84 89  Resp: '15  16 16  '$ Temp: 98.3 F (36.8 C)  98.3 F (36.8 C) 98.2  F (36.8 C)  TempSrc: Oral  Oral Oral  SpO2: 100% 97% 96% 93%  Weight: 109.4 kg     Height:        Intake/Output Summary (Last 24 hours) at 09/22/2021 1447 Last data filed at 09/22/2021 1000 Gross per 24 hour  Intake 2460 ml  Output 2045 ml  Net 415 ml    Filed Weights   09/20/21 0013 09/21/21 0455 09/22/21 0404  Weight: 109.3 kg 110 kg 109.4 kg    Physical Examination: Body mass index is 35.62 kg/m.   General: Obese built, not in obvious distress on 7 L of oxygen by nasal cannula HENT:   No scleral pallor or icterus noted. Oral mucosa is moist.  Chest:   Diminished breath sounds bilaterally.  CVS: S1 &S2 heard. No murmur.  Regular rate and rhythm. Abdomen: Soft, nontender, nondistended.  Bowel sounds are heard.   Extremities: No cyanosis, clubbing or edema.  Peripheral pulses are palpable. Psych: Alert, awake and oriented, normal mood CNS:  No cranial nerve deficits.  Power equal in all extremities.   Skin: Warm and dry.  Psoriatic rash over the bilateral elbows.  Data Reviewed:   CBC: Recent  Labs  Lab 09/17/21 0500 09/18/21 0425 09/19/21 0834 09/20/21 0518 09/21/21 0551 09/22/21 0335  WBC 5.2 5.0 4.2 3.7* 4.1 5.3  NEUTROABS 3.2 2.7 1.9 1.5*  --   --   HGB 8.8* 8.9* 8.9* 8.4* 8.3* 8.4*  HCT 29.9* 30.4* 30.5* 28.3* 28.1* 28.4*  MCV 106.8* 105.9* 106.3* 106.8* 108.1* 106.8*  PLT 120* 143* 162 157 157 148*    Basic Metabolic Panel: Recent Labs  Lab 09/18/21 0425 09/19/21 0834 09/20/21 0518 09/21/21 0551 09/22/21 0335  NA 142 141 143 141 140  K 3.2* 3.6 3.7 3.6 3.2*  CL 92* 90* 91* 92* 87*  CO2 44* 43* 43* 44* >45*  GLUCOSE 86 93 83 92 130*  BUN <5* <5* 5* 7* 9  CREATININE 0.60 0.55 0.75 0.67 0.60  CALCIUM 8.6* 8.6* 8.9 8.6* 8.5*  MG 1.9  --   --  1.9 2.0    Liver Function Tests: No results for input(s): "AST", "ALT", "ALKPHOS", "BILITOT", "PROT", "ALBUMIN" in the last 168 hours.   Radiology Studies: No results found.    LOS: 11 days    Flora Lipps, MD [facility] Available via Epic secure chat 7am-7pm After these hours, please refer to coverage provider listed on [email or web address] 09/22/2021, 2:47 PM

## 2021-09-22 NOTE — TOC Progression Note (Addendum)
Transition of Care [facility]) - Progression Note    Patient Details  Name: Terri Casey MRN: [id number] Date of Birth: [birth year]  [facility] CM/SW Contact  Zenon Mayo, RN Phone Number: 09/22/2021, 10:55 AM  Clinical Narrative:    She is set up with [facility] for [facility] [place], [place].  Soc will begin on Monday.  She has home oxygen, and conts on oxygen here at hospital.  NCM informed patient and [name], patient was very happy and [name] states this will work for her as well, will cancel OP therapy.  Expected Discharge Plan: [place] Barriers to Discharge: Continued Medical Work up  Expected Discharge Plan and Services Expected Discharge Plan: [place]   Discharge Planning Services: CM Consult Post Acute Care Choice: [employer] arrangements for the past 2 months: Single Family Home                   DME Agency: NA       HH Arranged: RN, PT, OT HH Agency: Other - See comment ([place]) Date [place]: 09/21/21 Time HH Agency Contacted: 1500 Representative spoke with at [place]: [place] ([place]) Interventions    Readmission Risk Interventions    09/14/2021    3:21 PM 03/21/2021   10:52 AM 03/20/2021   12:14 PM  Readmission Risk Prevention Plan  Post Dischage Appt  Complete   Medication Screening  Complete Complete  Transportation Screening  Complete Complete  PCP or Specialist Appt within 3-5 Days Complete    HRI or [place] Complete    Social Work Consult for [place] Planning/Counseling Complete    Palliative Care Screening Not Applicable    Medication Review Press photographer) Complete

## 2021-09-22 NOTE — Progress Notes (Signed)
Physical Therapy Treatment Patient Details Name: Terri Casey MRN: [id number] DOB: August 05, 1960 Today's Date: 09/22/2021   History of Present Illness Pt is a 61 y.o. female admitted 09/11/21 after found floor tipped over in w/c with agonal breathing, unresponsiveness. Intubated in ED; ETT 9/11-9/13. Workup for acute encephalopathy, sepsis, acute hypoxemic respiratory failure due to severe COPD and aspiration PNA. S/p thoracentesis 9/18. PMH includes recent L foot fx (4-5th metatarsals; WB through heel in cam boot), CHF, COPD, HTN, DM.    PT Comments    Pt tolerates treatment well, ambulating for increased distances, however she appears to have difficulty maintaining heel WB on L foot. PT provides recommendation for the pt to continue to utilize wheelchair for most out of bed mobility at home, only ambulating for very short distances if absolutely necessary, with continued emphasis on avoiding WB through forefoot. PT continues to recommend HHPT. Pt will benefit frm a wheelchair as she is borrowing her grandmothers currently and has to share use with her.   Recommendations for follow up therapy are one component of a multi-disciplinary discharge planning process, led by the attending physician.  Recommendations may be updated based on patient status, additional functional criteria and insurance authorization.  Follow Up Recommendations  Home health PT     Assistance Recommended at Discharge Intermittent Supervision/Assistance  Patient can return home with the following A little help with walking and/or transfers;A little help with bathing/dressing/bathroom;Assistance with cooking/housework;Direct supervision/assist for medications management;Direct supervision/assist for financial management;Assist for transportation;Help with stairs or ramp for entrance   Equipment Recommendations  Rolling walker (2 wheels);Wheelchair (measurements PT);Wheelchair cushion (measurements PT)    Recommendations  for Other Services       Precautions / Restrictions Precautions Precautions: Fall;Other (comment) Precaution Comments: watch O2 sats Required Braces or Orthoses: Other Brace Other Brace: L foot cam boot Restrictions Weight Bearing Restrictions: Yes LLE Weight Bearing: Partial weight bearing LLE Partial Weight Bearing Percentage or Pounds: WB through heel only     Mobility  Bed Mobility Overal bed mobility: Modified Independent Bed Mobility: Supine to Sit, Sit to Supine     Supine to sit: Modified independent (Device/Increase time) Sit to supine: Modified independent (Device/Increase time)        Transfers Overall transfer level: Needs assistance Equipment used: Rolling walker (2 wheels) Transfers: Sit to/from Stand Sit to Stand: Min guard           General transfer comment: PT provides cues for WB through heel, pt appears to WB through flat foot    Ambulation/Gait Ambulation/Gait assistance: Min guard Gait Distance (Feet): 30 Feet Assistive device: Rolling walker (2 wheels) Gait Pattern/deviations: Step-to pattern Gait velocity: reduced Gait velocity interpretation: <1.31 ft/sec, indicative of household ambulator   General Gait Details: pt with difficulty preventing roll from heel to forefoot when ambulating, although making effort to prevent this.   Stairs             Wheelchair Mobility    Modified Rankin (Stroke Patients Only)       Balance Overall balance assessment: Needs assistance Sitting-balance support: No upper extremity supported, Feet supported Sitting balance-Leahy Scale: Good     Standing balance support: Bilateral upper extremity supported, Reliant on assistive device for balance Standing balance-Leahy Scale: Poor                              Cognition Arousal/Alertness: Awake/alert Behavior During Therapy: WFL for tasks assessed/performed Overall Cognitive Status:  Impaired/Different from baseline Area of  Impairment: Safety/judgement, Awareness                         Safety/Judgement: Decreased awareness of safety Awareness: Anticipatory   General Comments: pt requires re-education on importance of heel WB to protect forefoot        Exercises      General Comments General comments (skin integrity, edema, etc.): pt on 6L HFNC upon PT arrival, mobilizing on 6L [place], pt sats drop into mid-80s however pleth waveform is unreliable. Pt denies SOB when mobilizing and sats recover to 90% quickly when not gripping RW      Pertinent Vitals/Pain Pain Assessment Pain Assessment: Faces Faces Pain Scale: Hurts little more Pain Location: L lateral foot s/p ambulation Pain Descriptors / Indicators: Sore Pain Intervention(s): Monitored during session    Home Living                          Prior Function            PT Goals (current goals can now be found in the care plan section) Acute Rehab PT Goals Patient Stated Goal: to be independent again Progress towards PT goals: Progressing toward goals    Frequency    Min 3X/week      PT Plan Current plan remains appropriate    Co-evaluation              AM-PAC PT "6 Clicks" Mobility   Outcome Measure  Help needed turning from your back to your side while in a flat bed without using bedrails?: None Help needed moving from lying on your back to sitting on the side of a flat bed without using bedrails?: None Help needed moving to and from a bed to a chair (including a wheelchair)?: A Little Help needed standing up from a chair using your arms (e.g., wheelchair or bedside chair)?: A Little Help needed to walk in hospital room?: A Little Help needed climbing 3-5 steps with a railing? : Total 6 Click Score: 18    End of Session Equipment Utilized During Treatment: Oxygen Activity Tolerance: Patient tolerated treatment well Patient left: in bed;with call bell/phone within reach;with bed alarm set Nurse  Communication: Mobility status PT Visit Diagnosis: Unsteadiness on feet (R26.81);Muscle weakness (generalized) (M62.81)     Time: 9326-7124 PT Time Calculation (min) (ACUTE ONLY): 23 min  Charges:  $Gait Training: 8-22 mins $Therapeutic Activity: 8-22 mins                     Zenaida Niece, PT, DPT Acute Rehabilitation Office [place] 09/22/2021, 2:52 PM

## 2021-09-23 ENCOUNTER — Inpatient Hospital Stay (HOSPITAL_COMMUNITY): Payer: Medicaid Other

## 2021-09-23 DIAGNOSIS — I480 Paroxysmal atrial fibrillation: Secondary | ICD-10-CM | POA: Diagnosis not present

## 2021-09-23 DIAGNOSIS — J9602 Acute respiratory failure with hypercapnia: Secondary | ICD-10-CM | POA: Diagnosis not present

## 2021-09-23 DIAGNOSIS — A419 Sepsis, unspecified organism: Secondary | ICD-10-CM | POA: Diagnosis not present

## 2021-09-23 DIAGNOSIS — J9601 Acute respiratory failure with hypoxia: Secondary | ICD-10-CM | POA: Diagnosis not present

## 2021-09-23 LAB — GLUCOSE, CAPILLARY
Glucose-Capillary: 122 mg/dL — ABNORMAL HIGH (ref 70–99)
Glucose-Capillary: 125 mg/dL — ABNORMAL HIGH (ref 70–99)
Glucose-Capillary: 137 mg/dL — ABNORMAL HIGH (ref 70–99)
Glucose-Capillary: 86 mg/dL (ref 70–99)
Glucose-Capillary: 94 mg/dL (ref 70–99)
Glucose-Capillary: 95 mg/dL (ref 70–99)

## 2021-09-23 LAB — CBC
HCT: 27.7 % — ABNORMAL LOW (ref 36.0–46.0)
Hemoglobin: 8 g/dL — ABNORMAL LOW (ref 12.0–15.0)
MCH: 31.1 pg (ref 26.0–34.0)
MCHC: 28.9 g/dL — ABNORMAL LOW (ref 30.0–36.0)
MCV: 107.8 fL — ABNORMAL HIGH (ref 80.0–100.0)
Platelets: 158 10*3/uL (ref 150–400)
RBC: 2.57 MIL/uL — ABNORMAL LOW (ref 3.87–5.11)
RDW: 14.9 % (ref 11.5–15.5)
WBC: 5.7 10*3/uL (ref 4.0–10.5)
nRBC: 0 % (ref 0.0–0.2)

## 2021-09-23 LAB — BASIC METABOLIC PANEL
BUN: 8 mg/dL (ref 8–23)
CO2: >45 mmol/L — ABNORMAL HIGH (ref 22–32)
Calcium: 9 mg/dL (ref 8.9–10.3)
Chloride: 87 mmol/L — ABNORMAL LOW (ref 98–111)
Creatinine, Ser: 0.59 mg/dL (ref 0.44–1.00)
GFR, Estimated: >60 mL/min (ref >60)
Glucose, Bld: 145 mg/dL — ABNORMAL HIGH (ref 70–99)
Potassium: 3.5 mmol/L (ref 3.5–5.1)
Sodium: 140 mmol/L (ref 135–145)

## 2021-09-23 LAB — MAGNESIUM: Magnesium: 2 mg/dL (ref 1.7–2.4)

## 2021-09-23 MED ORDER — FLUCONAZOLE 150 MG PO TABS
150.0000 mg | ORAL_TABLET | Freq: Once | ORAL | Status: AC
  Administered 2021-09-23: 150 mg via ORAL
  Filled 2021-09-23: qty 1

## 2021-09-23 NOTE — Progress Notes (Signed)
PROGRESS NOTE    Terri Casey  LEX:[id number] DOB: [birth year] DOA: 09/11/2021 PCP: [name], NP    Brief Narrative:   61 year old female with past medical history of COPD currently smoking, diastolic heart failure, diabetes mellitus, hypertension, hypothyroidism, paroxysmal atrial fibrillation, obstructive sleep apnea presented to hospital with unresponsiveness and agonal breathing.  She was found on the floor tipped over in a wheelchair in respiratory distress.  Patient initially was put on BiPAP but failed BiPAP so was intubated in the ED and was admitted to ICU for further evaluation.  Patient was subsequently extubated on  9/13.  [facility] assumed care on 09/14/21.   Assessment and Plan:  Principal Problem:   Septic shock ([place]) Active Problems:   Acute hypercapnic respiratory failure (HCC)   Chronic diastolic CHF (congestive heart failure) (HCC)   AF (paroxysmal atrial fibrillation) (HCC)   Essential hypertension, benign   Hyperlipidemia   Hypothyroidism   PNA (pneumonia)   Acute hypoxemic respiratory failure (HCC)   Respiratory failure (HCC)   Acute metabolic encephalopathy secondary to sepsis Septic shock secondary to aspiration, possible staph epidermidis bacteremia Patient was initially on Levophed.  Methicillin-resistant staph epidermidis in 1 out of 3 cultures bottles.  Repeat  blood cultures negative to date.  She has completed 10 day course of antibiotic with vancomycin and Rocephin.   Acute on chronic hypoxemic hypercapnic respiratory failure  Aspiration pneumonia COPD Active smoker Left-sided pleural effusion shown on x-ray on 09/16/2021 s/p thoracentesis by PCCM on 09/18/2021 CT Chest with left mediastinal shift with fluid/debri in left main bronchus.  Patient was initially intubated on 09/11/21 and was extubated 09/13/21 and is currently on BiPAP at nighttime and supplemental oxygen during the daytime.  Chest x-ray with left pleural effusion and  pulmonary edema.  PCCM followed the patient during hospitalization and has been continued on DuoNebs, Pulmicort, Brovana nebulizer, flutter valve, pulmonary hygiene. CT chest without contrast showed small bilateral pleural effusions and associated atelectasis or consolidation particularly on the right.  Transudative effusion.  Patient is still on cannula at 5 L/min.  Patient is on 2 to 3 L at home.  On IV Lasix twice a day.  Patient is positive balance for 115m but output around 2700 mL.  X-ray done today showed hyperinflation of the lungs consistent with COPD emphysema with mild bibasilar atelectasis.  Diarrhea-improved   Paroxysmal atrial fibrillation Metoprolol on hold.  Xarelto has been restarted.   Chronic diastolic heart failure, likely acute Hypertension Chest x-ray with pulmonary edema, left-sided pleural effusion.  2D Echo showed EF of 60 to 65%, no regional wall motion abnormality, grade 1 diastolic dysfunction status post thoracocentesis.  Amlodipine on hold.  On Lasix '40mg'$  twice daily.  Diuresing well with improved chest x-ray but still on 5 L oxygen.    Hypokalemia Potassium level of 3.5.  Improved at this time.   Macrocytic Anemia  Latest hemoglobin of 8.0.  Continue to monitor.  Fracture of 4th and 5th metatarsal, Hx fall in August Patient was followed by orthopedics as outpatient.     Hypothyroidism Continue Synthroid   Hx migraines  imitrex and topamax on hold at this time.   Chronic pain Continue Percocet, hold flexeril   Depression Continue lexapro   Obesity Body mass index is 34.88 kg/m.  Would benefit from weight loss as outpatient   Tobacco abuse Reluctant to quit.  I again had a prolonged discussion with the patient regarding quitting smoking.    DVT prophylaxis: SCDs  Start: 09/11/21 1709 rivaroxaban (XARELTO) tablet 20 mg   Code Status:     Code Status: Full Code  Disposition:  Home health as per PT evaluation but family wishes skilled nursing  facility.  Continue to monitor after the weekend.  Likely 1 to 2 days.  Status is: Inpatient  Remains inpatient appropriate because: Pending clinical improvement,   Family Communication:    Spoke with the patient's son and daughter-in-law on 09/21/2021 they wish the patient to go to short-term rehabilitation prior to coming to the hospital.  [facility] has been consulted.  Consultants:  PCCM  Procedures:  Intubation and mechanical ventilation Thoracocentesis BiPAP  Antimicrobials:  Vancomycin and Rocephin IV-completed course  Anti-infectives (From admission, onward)    Start     Dose/Rate Route Frequency Ordered Stop   09/23/21 0800  fluconazole (DIFLUCAN) tablet 150 mg        150 mg Oral  Once 09/23/21 0710 09/23/21 0916   09/17/21 1215  vancomycin (VANCOREADY) IVPB 1500 mg/300 mL  Status:  Discontinued        1,500 mg 150 mL/hr over 120 Minutes Intravenous Every 24 hours 09/17/21 1125 09/22/21 0728   09/14/21 1800  vancomycin (VANCOREADY) IVPB 1250 mg/250 mL  Status:  Discontinued        1,250 mg 166.7 mL/hr over 90 Minutes Intravenous Every 12 hours 09/14/21 1621 09/17/21 1125   09/13/21 1130  cefTRIAXone (ROCEPHIN) 2 g in sodium chloride 0.9 % 100 mL IVPB  Status:  Discontinued        2 g 200 mL/hr over 30 Minutes Intravenous Every 24 hours 09/13/21 1044 09/22/21 0728   09/13/21 0300  vancomycin (VANCOCIN) IVPB 1000 mg/200 mL premix  Status:  Discontinued        1,000 mg 200 mL/hr over 60 Minutes Intravenous Every 24 hours 09/12/21 0007 09/12/21 0825   09/13/21 0300  vancomycin (VANCOREADY) IVPB 1250 mg/250 mL  Status:  Discontinued        1,250 mg 166.7 mL/hr over 90 Minutes Intravenous Every 24 hours 09/12/21 0825 09/14/21 1621   09/12/21 0100  vancomycin (VANCOREADY) IVPB 2000 mg/400 mL        2,000 mg 200 mL/hr over 120 Minutes Intravenous  Once 09/12/21 0007 09/12/21 0340   09/11/21 2000  piperacillin-tazobactam (ZOSYN) IVPB 3.375 g  Status:  Discontinued        3.375  g 12.5 mL/hr over 240 Minutes Intravenous Every 8 hours 09/11/21 1312 09/13/21 1044   09/11/21 1300  piperacillin-tazobactam (ZOSYN) IVPB 3.375 g        3.375 g 100 mL/hr over 30 Minutes Intravenous  Once 09/11/21 1258 09/11/21 1407   09/11/21 0545  cefTRIAXone (ROCEPHIN) 2 g in sodium chloride 0.9 % 100 mL IVPB        2 g 200 mL/hr over 30 Minutes Intravenous  Once 09/11/21 0536 09/11/21 0654   09/11/21 0545  azithromycin (ZITHROMAX) 500 mg in sodium chloride 0.9 % 250 mL IVPB  Status:  Discontinued        500 mg 250 mL/hr over 60 Minutes Intravenous Every 24 hours 09/11/21 0536 09/11/21 1250      Subjective: Today, was seen and examined at bedside.  Still complains of cough some shortness of breath but no chest pain fever or chills.  Continues to feel better.  Objective: Vitals:   09/23/21 0412 09/23/21 0800 09/23/21 1038 09/23/21 1113  BP: (!) 107/44 123/81  (!) 103/50  Pulse: 79 95 85 85  Resp: 13 15  14 14  Temp: 98.9 F (37.2 C) 98.5 F (36.9 C)  98.3 F (36.8 C)  TempSrc: Oral Oral  Oral  SpO2: 98% 94% 100% 92%  Weight:      Height:        Intake/Output Summary (Last 24 hours) at 09/23/2021 1451 Last data filed at 09/23/2021 1100 Gross per 24 hour  Intake 480 ml  Output 1975 ml  Net -1495 ml    Filed Weights   09/21/21 0455 09/22/21 0404 09/23/21 0242  Weight: 110 kg 109.4 kg 107.1 kg    Physical Examination: Body mass index is 34.88 kg/m.   General: Obese built, not in obvious distress on 5 L of oxygen by nasal cannula HENT:   No scleral pallor or icterus noted. Oral mucosa is moist.  Right internal jugular central catheter Chest: Respirations bilaterally.  No crackles or wheezes. CVS: S1 &S2 heard. No murmur.  Regular rate and rhythm. Abdomen: Soft, nontender, nondistended.  Bowel sounds are heard.   Extremities: No cyanosis, clubbing or edema.  Peripheral pulses are palpable. Psych: Alert, awake and oriented, normal mood CNS:  No cranial nerve deficits.   Power equal in all extremities.   Skin: Warm and dry.  Psoriatic rash over the bilateral elbows.  Data Reviewed:   CBC: Recent Labs  Lab 09/17/21 0500 09/18/21 0425 09/19/21 9628 09/20/21 0518 09/21/21 0551 09/22/21 0335 09/23/21 0441  WBC 5.2 5.0 4.2 3.7* 4.1 5.3 5.7  NEUTROABS 3.2 2.7 1.9 1.5*  --   --   --   HGB 8.8* 8.9* 8.9* 8.4* 8.3* 8.4* 8.0*  HCT 29.9* 30.4* 30.5* 28.3* 28.1* 28.4* 27.7*  MCV 106.8* 105.9* 106.3* 106.8* 108.1* 106.8* 107.8*  PLT 120* 143* 162 157 157 148* 366    Basic Metabolic Panel: Recent Labs  Lab 09/18/21 0425 09/19/21 0834 09/20/21 0518 09/21/21 0551 09/22/21 0335 09/23/21 0441  NA 142 141 143 141 140 140  K 3.2* 3.6 3.7 3.6 3.2* 3.5  CL 92* 90* 91* 92* 87* 87*  CO2 44* 43* 43* 44* >45* >45*  GLUCOSE 86 93 83 92 130* 145*  BUN <5* <5* 5* 7* 9 8  CREATININE 0.60 0.55 0.75 0.67 0.60 0.59  CALCIUM 8.6* 8.6* 8.9 8.6* 8.5* 9.0  MG 1.9  --   --  1.9 2.0 2.0    Liver Function Tests: No results for input(s): "AST", "ALT", "ALKPHOS", "BILITOT", "PROT", "ALBUMIN" in the last 168 hours.   Radiology Studies: DG Chest 2 View  Result Date: 09/23/2021 CLINICAL DATA:  Shortness of breath EXAM: CHEST - 2 VIEW COMPARISON:  September 20, 2021 FINDINGS: A right central line is stable. Stable cardiomegaly. The hila and mediastinum are unchanged. No pneumothorax. No nodules or masses. Mild atelectasis in the right base. There may be mild atelectasis in the left base. Hyperinflation of the lungs identified. IMPRESSION: 1. Hyperinflation of the lungs consistent with COPD or emphysema. 2. Probable mild bibasilar atelectasis. 3. Stable right central line, in good position. 4. No other interval changes. Electronically Signed   By: Dorise Bullion III M.D.   On: 09/23/2021 07:57      LOS: 12 days    Flora Lipps, MD [facility] Available via Epic secure chat 7am-7pm After these hours, please refer to coverage provider listed on  [email or web address] 09/23/2021, 2:51 PM

## 2021-09-23 NOTE — Progress Notes (Signed)
Pt c/o vaginal itching and burning during urination. Pt also having intermittent pain behind right knee described by pt as achy pain that has been present for the past two days.  On call provider paged.

## 2021-09-24 DIAGNOSIS — I480 Paroxysmal atrial fibrillation: Secondary | ICD-10-CM | POA: Diagnosis not present

## 2021-09-24 DIAGNOSIS — J9601 Acute respiratory failure with hypoxia: Secondary | ICD-10-CM | POA: Diagnosis not present

## 2021-09-24 DIAGNOSIS — I5032 Chronic diastolic (congestive) heart failure: Secondary | ICD-10-CM | POA: Diagnosis not present

## 2021-09-24 DIAGNOSIS — Z01818 Encounter for other preprocedural examination: Secondary | ICD-10-CM

## 2021-09-24 DIAGNOSIS — J9602 Acute respiratory failure with hypercapnia: Secondary | ICD-10-CM | POA: Diagnosis not present

## 2021-09-24 LAB — CBC
HCT: 28.4 % — ABNORMAL LOW (ref 36.0–46.0)
Hemoglobin: 8.5 g/dL — ABNORMAL LOW (ref 12.0–15.0)
MCH: 31.7 pg (ref 26.0–34.0)
MCHC: 29.9 g/dL — ABNORMAL LOW (ref 30.0–36.0)
MCV: 106 fL — ABNORMAL HIGH (ref 80.0–100.0)
Platelets: 164 10*3/uL (ref 150–400)
RBC: 2.68 MIL/uL — ABNORMAL LOW (ref 3.87–5.11)
RDW: 15 % (ref 11.5–15.5)
WBC: 5.9 10*3/uL (ref 4.0–10.5)
nRBC: 0 % (ref 0.0–0.2)

## 2021-09-24 LAB — GLUCOSE, CAPILLARY
Glucose-Capillary: 100 mg/dL — ABNORMAL HIGH (ref 70–99)
Glucose-Capillary: 101 mg/dL — ABNORMAL HIGH (ref 70–99)
Glucose-Capillary: 104 mg/dL — ABNORMAL HIGH (ref 70–99)
Glucose-Capillary: 111 mg/dL — ABNORMAL HIGH (ref 70–99)
Glucose-Capillary: 94 mg/dL (ref 70–99)
Glucose-Capillary: 95 mg/dL (ref 70–99)

## 2021-09-24 LAB — BASIC METABOLIC PANEL
Anion gap: 10 (ref 5–15)
BUN: 5 mg/dL — ABNORMAL LOW (ref 8–23)
CO2: 42 mmol/L — ABNORMAL HIGH (ref 22–32)
Calcium: 8.9 mg/dL (ref 8.9–10.3)
Chloride: 89 mmol/L — ABNORMAL LOW (ref 98–111)
Creatinine, Ser: 0.66 mg/dL (ref 0.44–1.00)
GFR, Estimated: >60 mL/min (ref >60)
Glucose, Bld: 93 mg/dL (ref 70–99)
Potassium: 3.7 mmol/L (ref 3.5–5.1)
Sodium: 141 mmol/L (ref 135–145)

## 2021-09-24 LAB — MAGNESIUM: Magnesium: 2.1 mg/dL (ref 1.7–2.4)

## 2021-09-24 MED ORDER — INSULIN ASPART 100 UNIT/ML IJ SOLN: 0.0000 [IU] | Freq: Every day | INTRAMUSCULAR | Status: DC

## 2021-09-24 MED ORDER — SUMATRIPTAN SUCCINATE 50 MG PO TABS: 50.0000 mg | ORAL_TABLET | ORAL | Status: DC | PRN

## 2021-09-24 MED ORDER — LORATADINE 10 MG PO TABS
10.0000 mg | ORAL_TABLET | Freq: Every day | ORAL | Status: DC
  Administered 2021-09-24 – 2021-09-25 (×2): 10 mg via ORAL
  Filled 2021-09-24 (×2): qty 1

## 2021-09-24 MED ORDER — INSULIN ASPART 100 UNIT/ML IJ SOLN
0.0000 [IU] | Freq: Three times a day (TID) | INTRAMUSCULAR | Status: DC
  Administered 2021-09-25: 2 [IU] via SUBCUTANEOUS

## 2021-09-24 MED ORDER — HYDROXYZINE HCL 10 MG PO TABS: 10.0000 mg | ORAL_TABLET | Freq: Three times a day (TID) | ORAL | Status: DC | PRN

## 2021-09-24 MED ORDER — TOPIRAMATE 25 MG PO TABS
100.0000 mg | ORAL_TABLET | Freq: Two times a day (BID) | ORAL | Status: DC
  Administered 2021-09-24 – 2021-09-25 (×2): 100 mg via ORAL
  Filled 2021-09-24 (×2): qty 4

## 2021-09-24 MED ORDER — BETHANECHOL CHLORIDE 5 MG PO TABS: 5.0000 mg | ORAL_TABLET | Freq: Two times a day (BID) | ORAL | Status: DC

## 2021-09-24 NOTE — Progress Notes (Signed)
PROGRESS NOTE    Terri Casey  ZSM:[id number] DOB: 06-Jan-1960 DOA: 09/11/2021 PCP: [name], NP    Brief Narrative:   61 year old female with past medical history of COPD currently smoking, diastolic heart failure, diabetes mellitus, hypertension, hypothyroidism, paroxysmal atrial fibrillation, obstructive sleep apnea presented to hospital with unresponsiveness and agonal breathing.  She was found on the floor tipped over in a wheelchair in respiratory distress.  Patient initially was put on BiPAP but failed BiPAP so was intubated in the ED and was admitted to ICU for further evaluation.  Patient was subsequently extubated on  9/13.  [facility] assumed care on 09/14/21.   Assessment and Plan:  Principal Problem:   Septic shock ([place]) Active Problems:   Acute hypercapnic respiratory failure (HCC)   Chronic diastolic CHF (congestive heart failure) (HCC)   AF (paroxysmal atrial fibrillation) (HCC)   Essential hypertension, benign   Hyperlipidemia   Hypothyroidism   PNA (pneumonia)   Acute hypoxemic respiratory failure (HCC)   Respiratory failure (HCC)   Acute metabolic encephalopathy secondary to sepsis Septic shock secondary to aspiration, possible staph epidermidis bacteremia Patient was initially on Levophed.  Methicillin-resistant staph epidermidis in 1 out of 3 cultures bottles.  Repeat  blood cultures negative to date.  She has completed 10 day course of antibiotic with vancomycin and Rocephin.   Acute on chronic hypoxemic hypercapnic respiratory failure  Aspiration pneumonia COPD Active smoker Left-sided pleural effusion shown on x-ray on 09/16/2021 s/p thoracentesis by PCCM on 09/18/2021 CT Chest with left mediastinal shift with fluid/debri in left main bronchus.  Patient was initially intubated on 09/11/21 and was extubated 09/13/21 and is currently on BiPAP at nighttime and supplemental oxygen during the daytime.  Chest x-ray with left pleural effusion and  pulmonary edema.  PCCM followed the patient during hospitalization and has been continued on DuoNebs, Pulmicort, Brovana nebulizer, flutter valve, pulmonary hygiene. CT chest without contrast showed small bilateral pleural effusions and associated atelectasis or consolidation particularly on the right.  Transudative effusion.  On cannula at 4 L/min.  Patient is on 2 to 3 L at home.  On IV Lasix twice a day.  Patient is positive balance for 1323 mL but but output around 1075.  X-ray done today showed hyperinflation of the lungs consistent with COPD emphysema with mild bibasilar atelectasis.  Patient was extensively counseled regarding quitting smoking but she is still reluctant.  Plan to change to oral diuretic by tomorrow  Diarrhea-improved   Paroxysmal atrial fibrillation Metoprolol on hold.  Xarelto has been restarted.   Chronic diastolic heart failure, likely acute Hypertension Chest x-ray with pulmonary edema, left-sided pleural effusion.  2D Echo showed EF of 60 to 65%, no regional wall motion abnormality, grade 1 diastolic dysfunction status post thoracocentesis.  Amlodipine on hold.  On Lasix '40mg'$  IV twice daily.  Diuresing well with improved chest x-ray but still on 4 L oxygen.  Needs to change to oral diuretic by tomorrow  Hypokalemia Potassium level of 3.5.  Improved at this time.   Macrocytic Anemia  Latest hemoglobin of 8.0.  Continue to monitor.  Fracture of 4th and 5th metatarsal, Hx fall in August Patient was followed by orthopedics as outpatient.     Hypothyroidism Continue Synthroid   Hx migraines Resume Imitrex and Topamax from home.   Chronic pain Continue Percocet, and Flexeril.   Depression/anxiety Continue lexapro, added Atarax for anxiety.   Obesity Body mass index is 34.73 kg/m.  Would benefit from weight  loss as outpatient  Psoriatic rashes over the elbows.  Erythematous rash over the bilateral lower extremity.  No itching fever chills.  We will closely  monitor overnight.   Tobacco abuse Reluctant to quit.  I again had a prolonged discussion with the patient regarding quitting smoking.  Encouraged patient to discuss this with her primary care physician about alternative treatments for tobacco cessation.    DVT prophylaxis: SCDs Start: 09/11/21 1709 rivaroxaban (XARELTO) tablet 20 mg   Code Status:     Code Status: Full Code  Disposition:  Home health as per PT evaluation but family wishes skilled nursing facility.  Continue to monitor after the weekend.  Likely 1 to 2 days.  Status is: Inpatient  Remains inpatient appropriate because: Pending clinical improvement,   Family Communication:    Spoke with the patient's son and daughter-in-law on 09/24/2021.  TOC has been consulted.  Consultants:  PCCM  Procedures:  Intubation and mechanical ventilation Thoracocentesis BiPAP  Antimicrobials:  Vancomycin and Rocephin IV-completed course   Subjective: Today, patient was seen and examined at bedside.  Patient states that she wishes to go home.  Has developed some red rash on the leg but no itching pain fever or chills.  Has mild cough but denies worsening shortness of breath no chest pain.  Objective: Vitals:   09/24/21 0016 09/24/21 0409 09/24/21 0837 09/24/21 0854  BP: (!) 96/58 111/63 (!) 140/85   Pulse: 82 79 100   Resp: '14 12 16   '$ Temp:  98.5 F (36.9 C) 98.5 F (36.9 C)   TempSrc:  Oral Oral   SpO2: 99% 99% 91% (!) 87%  Weight:  106.7 kg    Height:        Intake/Output Summary (Last 24 hours) at 09/24/2021 1525 Last data filed at 09/24/2021 0417 Gross per 24 hour  Intake 240 ml  Output 300 ml  Net -60 ml    Filed Weights   09/22/21 0404 09/23/21 0242 09/24/21 0409  Weight: 109.4 kg 107.1 kg 106.7 kg    Physical Examination: Body mass index is 34.73 kg/m.   General: Obese built, not in obvious distress on 4 L of oxygen by nasal cannula HENT:   Pallor noted.  Oral mucosa is moist.  Right internal jugular  central catheter placed due to lack of IV access Chest: Decreased respirations bilaterally.  No crackles or wheezes. CVS: S1 &S2 heard. No murmur.  Regular rate and rhythm. Abdomen: Soft, nontender, nondistended.  Bowel sounds are heard.   Extremities: No cyanosis, clubbing or edema.  Peripheral pulses are palpable. Psych: Alert, awake and oriented, normal mood CNS:  No cranial nerve deficits.  Power equal in all extremities.   Skin: Warm and dry.  Psoriatic rash over the bilateral elbows.  Erythematous rash over the bilateral lower extremities.  Data Reviewed:   CBC: Recent Labs  Lab 09/18/21 0425 09/19/21 6144 09/20/21 0518 09/21/21 0551 09/22/21 0335 09/23/21 0441 09/24/21 0640  WBC 5.0 4.2 3.7* 4.1 5.3 5.7 5.9  NEUTROABS 2.7 1.9 1.5*  --   --   --   --   HGB 8.9* 8.9* 8.4* 8.3* 8.4* 8.0* 8.5*  HCT 30.4* 30.5* 28.3* 28.1* 28.4* 27.7* 28.4*  MCV 105.9* 106.3* 106.8* 108.1* 106.8* 107.8* 106.0*  PLT 143* 162 157 157 148* 158 315    Basic Metabolic Panel: Recent Labs  Lab 09/18/21 0425 09/19/21 0834 09/20/21 0518 09/21/21 0551 09/22/21 0335 09/23/21 0441 09/24/21 0640  NA 142   < > 143  141 140 140 141  K 3.2*   < > 3.7 3.6 3.2* 3.5 3.7  CL 92*   < > 91* 92* 87* 87* 89*  CO2 44*   < > 43* 44* >45* >45* 42*  GLUCOSE 86   < > 83 92 130* 145* 93  BUN <5*   < > 5* 7* 9 8 5*  CREATININE 0.60   < > 0.75 0.67 0.60 0.59 0.66  CALCIUM 8.6*   < > 8.9 8.6* 8.5* 9.0 8.9  MG 1.9  --   --  1.9 2.0 2.0 2.1   < > = values in this interval not displayed.    Liver Function Tests: No results for input(s): "AST", "ALT", "ALKPHOS", "BILITOT", "PROT", "ALBUMIN" in the last 168 hours.   Radiology Studies: DG Chest 2 View  Result Date: 09/23/2021 CLINICAL DATA:  Shortness of breath EXAM: CHEST - 2 VIEW COMPARISON:  September 20, 2021 FINDINGS: A right central line is stable. Stable cardiomegaly. The hila and mediastinum are unchanged. No pneumothorax. No nodules or masses. Mild  atelectasis in the right base. There may be mild atelectasis in the left base. Hyperinflation of the lungs identified. IMPRESSION: 1. Hyperinflation of the lungs consistent with COPD or emphysema. 2. Probable mild bibasilar atelectasis. 3. Stable right central line, in good position. 4. No other interval changes. Electronically Signed   By: Dorise Bullion III M.D.   On: 09/23/2021 07:57      LOS: 13 days    Flora Lipps, MD [facility] Available via Epic secure chat 7am-7pm After these hours, please refer to coverage provider listed on [email or web address] 09/24/2021, 3:25 PM

## 2021-09-25 DIAGNOSIS — J9602 Acute respiratory failure with hypercapnia: Secondary | ICD-10-CM | POA: Diagnosis not present

## 2021-09-25 DIAGNOSIS — A419 Sepsis, unspecified organism: Secondary | ICD-10-CM | POA: Diagnosis not present

## 2021-09-25 DIAGNOSIS — I480 Paroxysmal atrial fibrillation: Secondary | ICD-10-CM | POA: Diagnosis not present

## 2021-09-25 DIAGNOSIS — J9601 Acute respiratory failure with hypoxia: Secondary | ICD-10-CM | POA: Diagnosis not present

## 2021-09-25 LAB — GLUCOSE, CAPILLARY
Glucose-Capillary: 101 mg/dL — ABNORMAL HIGH (ref 70–99)
Glucose-Capillary: 122 mg/dL — ABNORMAL HIGH (ref 70–99)
Glucose-Capillary: 86 mg/dL (ref 70–99)
Glucose-Capillary: 99 mg/dL (ref 70–99)

## 2021-09-25 MED ORDER — FLUTICASONE PROPIONATE HFA 110 MCG/ACT IN AERO: 2.0000 | INHALATION_SPRAY | Freq: Two times a day (BID) | RESPIRATORY_TRACT | 2 refills | Status: DC

## 2021-09-25 MED ORDER — DM-GUAIFENESIN ER 30-600 MG PO TB12: 1.0000 | ORAL_TABLET | Freq: Two times a day (BID) | ORAL | 0 refills | Status: AC

## 2021-09-25 MED ORDER — UMECLIDINIUM BROMIDE 62.5 MCG/ACT IN AEPB: 1.0000 | INHALATION_SPRAY | Freq: Every day | RESPIRATORY_TRACT | 2 refills | Status: DC

## 2021-09-25 MED ORDER — HYDROXYZINE HCL 10 MG PO TABS: 10.0000 mg | ORAL_TABLET | Freq: Three times a day (TID) | ORAL | 0 refills | Status: AC | PRN

## 2021-09-25 MED ORDER — HYDROCORTISONE 1 % EX CREA
TOPICAL_CREAM | Freq: Two times a day (BID) | CUTANEOUS | Status: DC
  Filled 2021-09-25: qty 28

## 2021-09-25 MED ORDER — HYDROCORTISONE 1 % EX CREA: TOPICAL_CREAM | Freq: Two times a day (BID) | CUTANEOUS | 0 refills | Status: DC

## 2021-09-25 NOTE — Progress Notes (Signed)
    Durable Medical Equipment  (From admission, onward)           Start     Ordered   09/25/21 0854  For home use only DME lightweight manual wheelchair with seat cushion  Once       Comments: Patient suffers from weakness which impairs their ability to perform daily activities like bathing, dressing, grooming, and toileting in the home.  A cane will not resolve  issue with performing activities of daily living. A wheelchair will allow patient to safely perform daily activities. Patient is not able to propel themselves in the home using a standard weight wheelchair due to general weakness. Patient can self propel in the lightweight wheelchair. Length of need Lifetime. Accessories: elevating leg rests (ELRs), wheel locks, extensions and anti-tippers.   09/25/21 0110

## 2021-09-25 NOTE — Progress Notes (Signed)
Physical Therapy Treatment Patient Details Name: Terri Casey MRN: [id number] DOB: 1960-07-04 Today's Date: 09/25/2021   History of Present Illness Pt is a 61 y.o. female admitted 09/11/21 after found floor tipped over in w/c with agonal breathing, unresponsiveness. Intubated in ED; ETT 9/11-9/13. Workup for acute encephalopathy, sepsis, acute hypoxemic respiratory failure due to severe COPD and aspiration PNA. S/p thoracentesis 9/18. PMH includes recent L foot fx (4-5th metatarsals; WB through heel in cam boot), CHF, COPD, HTN, DM.    PT Comments    Pt received sitting EOB. She reports she is ready to go home but that her L foot hurts worse than when she arrived and she shows therapist rash on BLE's below the knee that is red and itchy. Education continued on keeping wt in L heel with ambulation, worked on leading with LLE with gait today but pt feels that she will lose her balance when she truly keeps all wt in L heel. Discussed need for thick shoe on R to help even her hips and manage low back pain. Pt ambulated 40' with rollator and min guard A with SPO2 dropping to 85% on 4L O2 and HR up to 119 bpm. After ambulation SPO2 returned to 92% on 4L and HR 98 bpm. PT will continue to follow.    Recommendations for follow up therapy are one component of a multi-disciplinary discharge planning process, led by the attending physician.  Recommendations may be updated based on patient status, additional functional criteria and insurance authorization.  Follow Up Recommendations  Home health PT     Assistance Recommended at Discharge Intermittent Supervision/Assistance  Patient can return home with the following A little help with walking and/or transfers;A little help with bathing/dressing/bathroom;Assistance with cooking/housework;Direct supervision/assist for medications management;Direct supervision/assist for financial management;Assist for transportation;Help with stairs or ramp for entrance    Equipment Recommendations  Rolling walker (2 wheels);Wheelchair (measurements PT);Wheelchair cushion (measurements PT)    Recommendations for Other Services       Precautions / Restrictions Precautions Precautions: Fall;Other (comment) Precaution Comments: watch O2 sats Required Braces or Orthoses: Other Brace Other Brace: L foot cam boot Restrictions Weight Bearing Restrictions: Yes LLE Weight Bearing: Partial weight bearing LLE Partial Weight Bearing Percentage or Pounds: WB through heel only     Mobility  Bed Mobility Overal bed mobility: Modified Independent             General bed mobility comments: pt up to EOB independently    Transfers Overall transfer level: Modified independent Equipment used: Rolling walker (2 wheels) Transfers: Sit to/from Stand Sit to Stand: Supervision           General transfer comment: to rollator    Ambulation/Gait Ambulation/Gait assistance: Min guard Gait Distance (Feet): 40 Feet Assistive device: Rollator (4 wheels) Gait Pattern/deviations: Step-to pattern, Trunk flexed Gait velocity: reduced Gait velocity interpretation: <1.31 ft/sec, indicative of household ambulator   General Gait Details: vc's to lead with LLE to promote heel [place], pt has difficulty with this as her natural tendency seems to be to lead with RLE. During ambulation SPO2 87% on 4L O2 with HR 119 bpm   Stairs             Wheelchair Mobility    Modified Rankin (Stroke Patients Only)       Balance Overall balance assessment: Needs assistance Sitting-balance support: No upper extremity supported, Feet supported Sitting balance-Leahy Scale: Good   Postural control: Posterior lean Standing balance support: Bilateral upper extremity supported, [place]  on assistive device for balance Standing balance-Leahy Scale: Poor Standing balance comment: Pt needs UE support to maintain weightbearing.                            Cognition  Arousal/Alertness: Awake/alert Behavior During Therapy: WFL for tasks assessed/performed Overall Cognitive Status: No family/caregiver present to determine baseline cognitive functioning Area of Impairment: Safety/judgement, Awareness                     Memory: Decreased short-term memory Following Commands: Follows one step commands consistently Safety/Judgement: Decreased awareness of safety Awareness: Anticipatory Problem Solving: Requires verbal cues General Comments: pt able to verbalize that she needs to WB through L heel only but relays that it is hard to do that in the boot without feeling on balance.        Exercises Other Exercises Other Exercises: pec stretch in sitting x30 secs Other Exercises: single arm shoulder flexion with standing break, R then L    General Comments General comments (skin integrity, edema, etc.): After ambulation, SPO2 92% on 4L, HR 98 bpm. Discussed the need for short bouts of activity due to quick fatigue and DOE. Also educated pt on the need for thickest shoe possible on RLE for mgmt of back pain with use of CAM boot on L.      Pertinent Vitals/Pain Pain Assessment Pain Assessment: Faces Faces Pain Scale: Hurts little more Pain Location: L foot and back Pain Descriptors / Indicators: Discomfort Pain Intervention(s): Limited activity within patient's tolerance, Monitored during session    Home Living                          Prior Function            PT Goals (current goals can now be found in the care plan section) Acute Rehab PT Goals Patient Stated Goal: to be independent again PT Goal Formulation: Patient unable to participate in goal setting Time For Goal Achievement: 09/27/21 Potential to Achieve Goals: Good Progress towards PT goals: Progressing toward goals    Frequency    Min 3X/week      PT Plan Current plan remains appropriate    Co-evaluation              AM-PAC PT "6 Clicks" Mobility    Outcome Measure  Help needed turning from your back to your side while in a flat bed without using bedrails?: None Help needed moving from lying on your back to sitting on the side of a flat bed without using bedrails?: None Help needed moving to and from a bed to a chair (including a wheelchair)?: A Little Help needed standing up from a chair using your arms (e.g., wheelchair or bedside chair)?: A Little Help needed to walk in hospital room?: A Little Help needed climbing 3-5 steps with a railing? : Total 6 Click Score: 18    End of Session Equipment Utilized During Treatment: Oxygen;Gait belt Activity Tolerance: Patient tolerated treatment well Patient left: in bed;with call bell/phone within reach Nurse Communication: Mobility status PT Visit Diagnosis: Unsteadiness on feet (R26.81);Muscle weakness (generalized) (M62.81)     Time: 1030-1056 PT Time Calculation (min) (ACUTE ONLY): 26 min  Charges:  $Gait Training: 8-22 mins $Therapeutic Exercise: 8-22 mins                     [employer], PT  Acute Rehab Services Secure chat preferred Office [place] 09/25/2021, 12:50 PM

## 2021-09-25 NOTE — Progress Notes (Signed)
Discharge instructions reviewed with pt. Copy of instructions given to pt. Pt informed her scripts were sent to her pharmacy for pick up. Pt verbalized understanding of instructions.  Pt waiting for son's girlfriend to come with her home O2 tank and  to transport her home.

## 2021-09-25 NOTE — Plan of Care (Signed)
Problem: Education: Goal: Knowledge of General Education information will improve Description: Including pain rating scale, medication(s)/side effects and non-pharmacologic comfort measures 09/25/2021 1806 by Rolm Baptise, RN Outcome: Adequate for Discharge 09/25/2021 1137 by Rolm Baptise, RN Outcome: Adequate for Discharge   Problem: Health Behavior/Discharge Planning: Goal: Ability to manage health-related needs will improve 09/25/2021 1806 by Rolm Baptise, RN Outcome: Adequate for Discharge 09/25/2021 1137 by Rolm Baptise, RN Outcome: Adequate for Discharge   Problem: Clinical Measurements: Goal: Ability to maintain clinical measurements within normal limits will improve 09/25/2021 1806 by Rolm Baptise, RN Outcome: Adequate for Discharge 09/25/2021 1137 by Rolm Baptise, RN Outcome: Adequate for Discharge Goal: Will remain free from infection 09/25/2021 1806 by Rolm Baptise, RN Outcome: Adequate for Discharge 09/25/2021 1137 by Rolm Baptise, RN Outcome: Adequate for Discharge Goal: Diagnostic test results will improve 09/25/2021 1806 by Rolm Baptise, RN Outcome: Adequate for Discharge 09/25/2021 1137 by Rolm Baptise, RN Outcome: Adequate for Discharge Goal: Respiratory complications will improve 09/25/2021 1806 by Rolm Baptise, RN Outcome: Adequate for Discharge 09/25/2021 1137 by Rolm Baptise, RN Outcome: Adequate for Discharge Goal: Cardiovascular complication will be avoided 09/25/2021 1806 by Rolm Baptise, RN Outcome: Adequate for Discharge 09/25/2021 1137 by Rolm Baptise, RN Outcome: Adequate for Discharge   Problem: Activity: Goal: Risk for activity intolerance will decrease 09/25/2021 1806 by Rolm Baptise, RN Outcome: Adequate for Discharge 09/25/2021 1137 by Rolm Baptise, RN Outcome: Adequate for Discharge   Problem: Nutrition: Goal: Adequate nutrition will be maintained 09/25/2021 1806 by Rolm Baptise, RN Outcome:  Adequate for Discharge 09/25/2021 1137 by Rolm Baptise, RN Outcome: Adequate for Discharge   Problem: Coping: Goal: Level of anxiety will decrease 09/25/2021 1806 by Rolm Baptise, RN Outcome: Adequate for Discharge 09/25/2021 1137 by Rolm Baptise, RN Outcome: Adequate for Discharge   Problem: Elimination: Goal: Will not experience complications related to bowel motility 09/25/2021 1806 by Rolm Baptise, RN Outcome: Adequate for Discharge 09/25/2021 1137 by Rolm Baptise, RN Outcome: Adequate for Discharge Goal: Will not experience complications related to urinary retention 09/25/2021 1806 by Rolm Baptise, RN Outcome: Adequate for Discharge 09/25/2021 1137 by Rolm Baptise, RN Outcome: Adequate for Discharge   Problem: Pain Managment: Goal: General experience of comfort will improve 09/25/2021 1806 by Rolm Baptise, RN Outcome: Adequate for Discharge 09/25/2021 1137 by Rolm Baptise, RN Outcome: Adequate for Discharge   Problem: Safety: Goal: Ability to remain free from injury will improve 09/25/2021 1806 by Rolm Baptise, RN Outcome: Adequate for Discharge 09/25/2021 1137 by Rolm Baptise, RN Outcome: Adequate for Discharge   Problem: Skin Integrity: Goal: Risk for impaired skin integrity will decrease 09/25/2021 1806 by Rolm Baptise, RN Outcome: Adequate for Discharge 09/25/2021 1137 by Rolm Baptise, RN Outcome: Adequate for Discharge   Problem: Education: Goal: Ability to describe self-care measures that may prevent or decrease complications (Diabetes Survival Skills Education) will improve 09/25/2021 1806 by Rolm Baptise, RN Outcome: Adequate for Discharge 09/25/2021 1137 by Rolm Baptise, RN Outcome: Adequate for Discharge Goal: Individualized Educational Video(s) 09/25/2021 1806 by Rolm Baptise, RN Outcome: Adequate for Discharge 09/25/2021 1137 by Rolm Baptise, RN Outcome: Adequate for Discharge   Problem: Coping: Goal: Ability  to adjust to condition or change in health will improve 09/25/2021 1806 by Rolm Baptise, RN Outcome: Adequate for Discharge 09/25/2021 1137 by Rolm Baptise,  RN Outcome: Adequate for Discharge   Problem: Fluid Volume: Goal: Ability to maintain a balanced intake and output will improve 09/25/2021 1806 by Rolm Baptise, RN Outcome: Adequate for Discharge 09/25/2021 1137 by Rolm Baptise, RN Outcome: Adequate for Discharge   Problem: Health Behavior/Discharge Planning: Goal: Ability to identify and utilize available resources and services will improve 09/25/2021 1806 by Rolm Baptise, RN Outcome: Adequate for Discharge 09/25/2021 1137 by Rolm Baptise, RN Outcome: Adequate for Discharge Goal: Ability to manage health-related needs will improve 09/25/2021 1806 by Rolm Baptise, RN Outcome: Adequate for Discharge 09/25/2021 1137 by Rolm Baptise, RN Outcome: Adequate for Discharge   Problem: Metabolic: Goal: Ability to maintain appropriate glucose levels will improve 09/25/2021 1806 by Rolm Baptise, RN Outcome: Adequate for Discharge 09/25/2021 1137 by Rolm Baptise, RN Outcome: Adequate for Discharge   Problem: Nutritional: Goal: Maintenance of adequate nutrition will improve 09/25/2021 1806 by Rolm Baptise, RN Outcome: Adequate for Discharge 09/25/2021 1137 by Rolm Baptise, RN Outcome: Adequate for Discharge Goal: Progress toward achieving an optimal weight will improve 09/25/2021 1806 by Rolm Baptise, RN Outcome: Adequate for Discharge 09/25/2021 1137 by Rolm Baptise, RN Outcome: Adequate for Discharge   Problem: Skin Integrity: Goal: Risk for impaired skin integrity will decrease 09/25/2021 1806 by Rolm Baptise, RN Outcome: Adequate for Discharge 09/25/2021 1137 by Rolm Baptise, RN Outcome: Adequate for Discharge   Problem: Tissue Perfusion: Goal: Adequacy of tissue perfusion will improve 09/25/2021 1806 by Rolm Baptise, RN Outcome:  Adequate for Discharge 09/25/2021 1137 by Rolm Baptise, RN Outcome: Adequate for Discharge   Problem: Activity: Goal: Ability to tolerate increased activity will improve 09/25/2021 1806 by Rolm Baptise, RN Outcome: Adequate for Discharge 09/25/2021 1137 by Rolm Baptise, RN Outcome: Adequate for Discharge   Problem: Role Relationship: Goal: Method of communication will improve 09/25/2021 1806 by Rolm Baptise, RN Outcome: Adequate for Discharge 09/25/2021 1137 by Rolm Baptise, RN Outcome: Adequate for Discharge

## 2021-09-25 NOTE — Progress Notes (Signed)
Occupational Therapy Treatment Patient Details Name: Terri Casey MRN: [id number] DOB: Jun 30, 1960 Today's Date: 09/25/2021   History of present illness Pt is a 61 y.o. female admitted 09/11/21 after found floor tipped over in w/c with agonal breathing, unresponsiveness. Intubated in ED; ETT 9/11-9/13. Workup for acute encephalopathy, sepsis, acute hypoxemic respiratory failure due to severe COPD and aspiration PNA. S/p thoracentesis 9/18. PMH includes recent L foot fx (4-5th metatarsals; WB through heel in cam boot), CHF, COPD, HTN, DM.   OT comments  Pt making progress. Completing ADL tasks @ wc level. After conversation with [name], she has been using her mother in [place] wc "when she is not in it". Given her recent foot fx and WB via heel only with Cam boot, recommend pt mobilize primarily at wc level - CM to order. Aide assists with ADL tasks when she is there. Minimal SOB with activity with desat to @ 88, however poor pleth. Pt states she feels close to baseline regarding her SOB. Pt plans to continue to smoke - discussed importance of not smoking on oxygen and recommendations to quit. Further OT to be addressed by [place].    Recommendations for follow up therapy are one component of a multi-disciplinary discharge planning process, led by the attending physician.  Recommendations may be updated based on patient status, additional functional criteria and insurance authorization.    Follow Up Recommendations  Home health OT    Assistance Recommended at Discharge Intermittent Supervision/Assistance  Patient can return home with the following  A little help with walking and/or transfers;A little help with bathing/dressing/bathroom;Assistance with cooking/housework;Assist for transportation;Help with stairs or ramp for entrance;Direct supervision/assist for financial management;Direct supervision/assist for medications management   Equipment Recommendations  Wheelchair (measurements  OT);Wheelchair cushion (measurements OT)    Recommendations for Other Services      Precautions / Restrictions Precautions Precautions: Fall;Other (comment) Precaution Comments: watch O2 sats Required Braces or Orthoses: Other Brace Other Brace: L foot cam boot Restrictions Weight Bearing Restrictions: Yes LLE Weight Bearing: Partial weight bearing Other Position/Activity Restrictions: WB through heel in L foot CAM boot       Mobility Bed Mobility Overal bed mobility: Modified Independent                  Transfers Overall transfer level: Modified independent                 General transfer comment: bed<> BSC     Balance     Sitting balance-Leahy Scale: Good                                     ADL either performed or assessed with clinical judgement   ADL                           Toilet Transfer: Modified Independent;BSC/3in1;Stand-pivot   Toileting- Clothing Manipulation and Hygiene: Set up       Functional mobility during ADLs: Modified independent (stand pivot)      Extremity/Trunk Assessment              Vision       Perception     Praxis      Cognition  Exercises Exercises: Other exercises    Shoulder Instructions       General Comments 4L HFNC    Pertinent Vitals/ Pain       Pain Assessment Pain Assessment: Faces Faces Pain Scale: Hurts a little bit Pain Location: generalized Pain Descriptors / Indicators: Discomfort Pain Intervention(s): Monitored during session  Home Living                                          Prior Functioning/Environment              Frequency  Min 2X/week        Progress Toward Goals  OT Goals(current goals can now be found in the care plan section)  Progress towards OT goals: Progressing toward goals  Acute Rehab OT Goals Patient Stated Goal: to go home  today OT Goal Formulation: With patient Time For Goal Achievement: 09/29/21 Potential to Achieve Goals: Good ADL Goals Pt Will Perform Grooming: with supervision;standing Pt Will Perform Lower Body Dressing: with supervision;with adaptive equipment;sit to/from stand Pt Will Transfer to Toilet: with supervision;ambulating;bedside commode;stand pivot transfer Pt Will Perform Toileting - Clothing Manipulation and hygiene: with supervision;sit to/from stand;sitting/lateral leans Additional ADL Goal #1: Pt will complete pursed lip breathing during ADL to maintain SpO2 >88% with no more than minimal cueing. Additional ADL Goal #2: Pt will tolerate 10 minutes of activity with SpO2 maintained >88% during ADLs.  Plan Discharge plan remains appropriate    Co-evaluation                 AM-PAC OT "6 Clicks" Daily Activity     Outcome Measure   Help from another person eating meals?: None Help from another person taking care of personal grooming?: A Little Help from another person toileting, which includes using toliet, bedpan, or urinal?: A Little Help from another person bathing (including washing, rinsing, drying)?: A Little Help from another person to put on and taking off regular upper body clothing?: A Little Help from another person to put on and taking off regular lower body clothing?: A Little 6 Click Score: 19    End of Session Equipment Utilized During Treatment: Oxygen (4L)  OT Visit Diagnosis: History of falling (Z91.81);Muscle weakness (generalized) (M62.81)   Activity Tolerance Patient tolerated treatment well   Patient Left in bed;with call bell/phone within reach   Nurse Communication Mobility status;Other (comment) (DC needs)        Time: 8841-6606 OT Time Calculation (min): 24 min  Charges: OT General Charges $OT Visit: 1 Visit OT Treatments $Self Care/Home Management : 23-37 mins  Maurie Boettcher, OT/L   Acute OT Clinical Specialist [place] Pager [phone] Office [phone]   [facility] 09/25/2021, 8:52 AM

## 2021-09-25 NOTE — TOC Transition Note (Signed)
Transition of Care [facility]) - CM/SW Discharge Note   Patient Details  Name: Terri Casey MRN: [id number] Date of Birth: 26-Feb-1960  [facility] CM/SW Contact:  Zenon Mayo, RN Phone Number: 09/25/2021, 10:31 AM   Clinical Narrative:    Patient is for dc today, she is set up with [facility] for [facility] [place].  NCM notified [name] of dc today.  She also needs a w/chair per physical therapist.  NCM spoke with patient regarding company to supply DME , she states she is ok with [employer] supplying this for her.  NCM made refereal to [facility] with [employer].  The w/chair will be brought up to patient room prior to dc. She states her DIL will transport her home today and she will bring her oxygen tank.     Final next level of care: [place] Barriers to Discharge: No Barriers Identified   Patient Goals and CMS Choice Patient states their goals for this hospitalization and ongoing recovery are:: return home CMS Medicare.gov Compare Post Acute Care list provided to:: Patient Choice offered to / list presented to : Patient  Discharge Placement                       Discharge Plan and Services   Discharge Planning Services: CM Consult Post Acute Care Choice: Home Health          DME Arranged: Lightweight manual wheelchair with seat cushion DME Agency: [employer] Date DME Agency Contacted: 09/25/21 Time DME Agency Contacted: 53 Representative spoke with at DME Agency: Jodell Cipro HH Arranged: RN, PT, OT [place] Agency:  ([place]) Date [place]: 09/21/21 Time [place]: 1500 Representative spoke with at [place]: [place] ([place]) Interventions     Readmission Risk Interventions    09/14/2021    3:21 PM 03/21/2021   10:52 AM 03/20/2021   12:14 PM  Readmission Risk Prevention Plan  Post Dischage Appt  Complete   Medication Screening  Complete Complete  Transportation  Screening  Complete Complete  PCP or Specialist Appt within 3-5 Days Complete    HRI or [place] Complete    Social Work Consult for [place] Planning/Counseling Complete    Palliative Care Screening Not Applicable    Medication Review Press photographer) Complete

## 2021-09-25 NOTE — Plan of Care (Signed)
  Problem: Education: Goal: Knowledge of General Education information will improve Description: Including pain rating scale, medication(s)/side effects and non-pharmacologic comfort measures Outcome: Adequate for Discharge   Problem: Health Behavior/Discharge Planning: Goal: Ability to manage health-related needs will improve Outcome: Adequate for Discharge   Problem: Clinical Measurements: Goal: Ability to maintain clinical measurements within normal limits will improve Outcome: Adequate for Discharge Goal: Will remain free from infection Outcome: Adequate for Discharge Goal: Diagnostic test results will improve Outcome: Adequate for Discharge Goal: Respiratory complications will improve Outcome: Adequate for Discharge Goal: Cardiovascular complication will be avoided Outcome: Adequate for Discharge   Problem: Activity: Goal: Risk for activity intolerance will decrease Outcome: Adequate for Discharge   Problem: Nutrition: Goal: Adequate nutrition will be maintained Outcome: Adequate for Discharge   Problem: Coping: Goal: Level of anxiety will decrease Outcome: Adequate for Discharge   Problem: Elimination: Goal: Will not experience complications related to bowel motility Outcome: Adequate for Discharge Goal: Will not experience complications related to urinary retention Outcome: Adequate for Discharge   Problem: Pain Managment: Goal: General experience of comfort will improve Outcome: Adequate for Discharge   Problem: Safety: Goal: Ability to remain free from injury will improve Outcome: Adequate for Discharge   Problem: Skin Integrity: Goal: Risk for impaired skin integrity will decrease Outcome: Adequate for Discharge   Problem: Education: Goal: Ability to describe self-care measures that may prevent or decrease complications (Diabetes Survival Skills Education) will improve Outcome: Adequate for Discharge Goal: Individualized Educational Video(s) Outcome:  Adequate for Discharge   Problem: Coping: Goal: Ability to adjust to condition or change in health will improve Outcome: Adequate for Discharge   Problem: Fluid Volume: Goal: Ability to maintain a balanced intake and output will improve Outcome: Adequate for Discharge   Problem: Health Behavior/Discharge Planning: Goal: Ability to identify and utilize available resources and services will improve Outcome: Adequate for Discharge Goal: Ability to manage health-related needs will improve Outcome: Adequate for Discharge   Problem: Metabolic: Goal: Ability to maintain appropriate glucose levels will improve Outcome: Adequate for Discharge   Problem: Nutritional: Goal: Maintenance of adequate nutrition will improve Outcome: Adequate for Discharge Goal: Progress toward achieving an optimal weight will improve Outcome: Adequate for Discharge   Problem: Skin Integrity: Goal: Risk for impaired skin integrity will decrease Outcome: Adequate for Discharge   Problem: Tissue Perfusion: Goal: Adequacy of tissue perfusion will improve Outcome: Adequate for Discharge   Problem: Activity: Goal: Ability to tolerate increased activity will improve Outcome: Adequate for Discharge   Problem: Role Relationship: Goal: Method of communication will improve Outcome: Adequate for Discharge

## 2021-09-25 NOTE — Discharge Summary (Signed)
Physician Discharge Summary  TYIA BINFORD BMW:[id number] DOB: 1960-02-21 DOA: 09/11/2021  PCP: [name], NP  Admit date: 09/11/2021 Discharge date: 09/26/2021  Admitted From: Home  Discharge disposition: Home with home health   Recommendations for Outpatient Follow-Up:   Follow up with your primary care provider in one week.  Check CBC, BMP, magnesium in the next visit Patient should be encouraged cessation of smoking.  Discharge Diagnosis:   Principal Problem:   Septic shock ([place])- resolved Active Problems:   Acute hypercapnic respiratory failure (HCC)   Chronic diastolic CHF (congestive heart failure) (HCC)   AF (paroxysmal atrial fibrillation) (HCC)   Essential hypertension, benign   Hyperlipidemia   Hypothyroidism   PNA (pneumonia)-resolved   Encounter for intubation-resolved   Acute hypoxemic respiratory failure (HCC)resolved   Respiratory failure ([place])   Discharge Condition: Improved.  Diet recommendation: Low sodium, heart healthy.    Wound care: None.  Code status: Full.   History of Present Illness:   61 year old female with past medical history of COPD currently smoking, diastolic heart failure, diabetes mellitus, hypertension, hypothyroidism, paroxysmal atrial fibrillation, obstructive sleep apnea presented to hospital with unresponsiveness and agonal breathing.  She was found on the floor tipped over in a wheelchair in respiratory distress.  Patient initially was put on BiPAP but failed BiPAP so was intubated in the ED and was admitted to ICU for further evaluation.  Patient was subsequently extubated on  9/13.    Hospital Course:   Following conditions were addressed during hospitalization as listed below,  Acute metabolic encephalopathy secondary to sepsis Septic shock secondary to aspiration, possible staph epidermidis bacteremia Patient was initially on Levophed.  Methicillin-resistant staph epidermidis in 1 out of 3 cultures bottles.   Repeat  blood cultures negative to date.  She has completed 10 day course of antibiotic with vancomycin and Rocephin.   Acute on chronic hypoxemic hypercapnic respiratory failure  Aspiration pneumonia COPD Active smoker Left-sided pleural effusion shown on x-ray on 09/16/2021 s/p thoracentesis by PCCM on 09/18/2021 CT Chest with left mediastinal shift with fluid/debri in left main bronchus.  Patient was initially intubated on 09/11/21 and was extubated 09/13/21.  Patient was switched to nasal cannula oxygen at her home dose. Chest x-ray with left pleural effusion and pulmonary edema.  PCCM followed the patient during hospitalization and has been continued on DuoNebs, Pulmicort, Brovana nebulizer, flutter valve, pulmonary hygiene. CT chest without contrast showed small bilateral pleural effusions and associated atelectasis or consolidation particularly on the right.  Transudative effusion.  On cannula at 4 L/min.  Patient is on 2 to 3 L at home.  Received IV Lasix during hospitalization good urinary output.  Follow-up x-ray chest on 09/24/2021  showed hyperinflation of the lungs consistent with COPD emphysema with mild bibasilar atelectasis.  Patient was extensively counseled regarding quitting smoking but she is still reluctant.  We will continue oral diuretic on discharge.   Diarrhea-improved   Paroxysmal atrial fibrillation Metoprolol and Xarelto from home.   Chronic diastolic heart failure, likely acute Hypertension Chest x-ray with pulmonary edema, left-sided pleural effusion.  2D Echo showed EF of 60 to 65%, no regional wall motion abnormality, grade 1 diastolic dysfunction status post thoracocentesis.  Will resume oral diuresis on discharge.   Hypokalemia Proved at this time.   Macrocytic Anemia  Latest hemoglobin of 8.5.  Continue to monitor.   Fracture of 4th and 5th metatarsal, Hx fall in August Patient is advised to follow with orthopedics as outpatient.  Hypothyroidism Continue  Synthroid   Hx migraines Resume Imitrex and Topamax from home.   Chronic pain Continue Percocet, and Flexeril.   Depression/anxiety Continue lexapro on discharge.   Obesity Body mass index is 34.73 kg/m.  Would benefit from weight loss as outpatient   Psoriatic rashes over the elbows.  Erythematous rash over the bilateral lower extremity.  No itching fever chills.     Tobacco abuse Reluctant to quit.  I again had a prolonged discussion with the patient regarding quitting smoking.  Encouraged patient to discuss this with her primary care physician about alternative treatments for tobacco cessation.  Debility, weakness of, deconditioning.  Patient was seen by physical therapy during hospitalization and has recommended home health PT OT.  We will also arrange for RN on discharge.   Disposition.  At this time, patient is stable for disposition home with outpatient PCP follow-up.  Medical Consultants:   PCCM  Procedures:    Intubation and mechanical ventilation Thoracocentesis BiPAP   Subjective:   Today, patient was seen and examined at bedside.  Denies interval complaints.  Denies nausea vomiting fever chills or rigor.  Discharge Exam:   Vitals:   09/25/21 0945 09/25/21 1135  BP:  (!) 101/52  Pulse:  87  Resp:  16  Temp:    SpO2: 100% 94%   Vitals:   09/25/21 0417 09/25/21 0845 09/25/21 0945 09/25/21 1135  BP: 114/71 132/88  (!) 101/52  Pulse: 79 94  87  Resp: '19 15  16  '$ Temp: 97.6 F (36.4 C)     TempSrc: Oral     SpO2: 100% 93% 100% 94%  Weight: 105.2 kg     Height:       Body mass index is 34.26 kg/m.   General: Obese, alert awake, not in obvious distress on nasal cannula oxygen HENT: pupils equally reacting to light,  No scleral pallor or icterus noted. Oral mucosa is moist.  Chest:  Diminished breath sounds bilaterally. No crackles or wheezes.  CVS: S1 &S2 heard. No murmur.  Regular rate and rhythm. Abdomen: Soft, nontender, nondistended.  Bowel  sounds are heard.   Extremities: No cyanosis, clubbing or edema.  Peripheral pulses are palpable.  Psoriatic rash over the bilateral elbows. Psych: Alert, awake and oriented, normal mood CNS:  No cranial nerve deficits.  Power equal in all extremities.   Skin: Warm and dry.  Psoriatic rash over the bilateral elbows.  The results of significant diagnostics from this hospitalization (including imaging, microbiology, ancillary and laboratory) are listed below for reference.     Diagnostic Studies:   DG Abd 1 View  Result Date: 09/12/2021 CLINICAL DATA:  Evaluate location of enteric tube EXAM: ABDOMEN - 1 VIEW COMPARISON:  Study done earlier today FINDINGS: Tip and side port of enteric tube are noted within the stomach. There is mild dilation of small bowel loops in the visualized upper abdomen. Lower abdomen and pelvis are not included in the image. There is increased density in left lower lung field with blunting of left lateral CP angle. IMPRESSION: Tip of enteric tube is seen in the region of body of stomach. Increased density in left lower lung fields suggest pleural effusion and underlying atelectasis/pneumonia. Electronically Signed   By: Elmer Picker M.D.   On: 09/12/2021 09:36   DG CHEST PORT 1 VIEW  Result Date: 09/12/2021 CLINICAL DATA:  Intubation, orogastric tube placement EXAM: PORTABLE CHEST 1 VIEW COMPARISON:  Portable exam 0813 hours compared to 09/11/2021 FINDINGS: Tip of  endotracheal tube projects 6.1 cm above carina. Nasogastric tube extends to GE junction region. Tip of RIGHT jugular central venous catheter projects over SVC. Enlargement of cardiac silhouette. Atherosclerotic calcification aorta. Atelectasis versus consolidation of LEFT lower lobe with probable small LEFT pleural effusion. Remaining lungs clear though emphysematous. No pneumothorax or acute osseous findings. IMPRESSION: Emphysematous changes with atelectasis versus consolidation LEFT lower lobe and small LEFT  pleural effusion. Aortic Atherosclerosis (ICD10-I70.0) and Emphysema (ICD10-J43.9). Electronically Signed   By: Lavonia Dana M.D.   On: 09/12/2021 08:35   DG Abd Portable 1V  Result Date: 09/12/2021 CLINICAL DATA:  Intubation, orogastric tube placement EXAM: PORTABLE ABDOMEN - 1 VIEW COMPARISON:  Portable exam 0817 hours without priors for comparison FINDINGS: Tip of nasogastric tube projects above gastroesophageal junction; recommend advancing tube 10 cm to place proximal side-port within stomach. Air-filled nondistended loops of large and small bowel. No bowel obstruction or wall thickening. Atelectasis versus consolidation LEFT lower lobe with small LEFT pleural effusion. IMPRESSION: Recommend advancing nasogastric tube 10 cm. No abdominal findings. Electronically Signed   By: Lavonia Dana M.D.   On: 09/12/2021 08:33   DG Chest Port 1V same Day  Result Date: 09/11/2021 CLINICAL DATA:  Post intubation. EXAM: PORTABLE CHEST 1 VIEW COMPARISON:  September 11, 2021 FINDINGS: EKG leads project over the chest. Endotracheal tube in place approximately 2.9 cm from the expected location of the carina. LEFT mainstem bronchus showed material within the lumen on the prior study. Potential endotracheal material based on the appearance of the tracheal air column on the current exam. RIGHT IJ central venous catheter remains in place, tip in the area of the low SVC. Gastric tube coursing through in off field of the radiograph into the upper abdomen. Cardiomediastinal contours and hilar structures are stable. Persistent LEFT lower lobe consolidation and potential small LEFT effusion. No pneumothorax. Volume loss in the LEFT chest leads to shift of mediastinal structures from RIGHT to LEFT. On limited assessment no acute skeletal process. IMPRESSION: 1. Endotracheal tube in place approximately 2.9 cm from the expected location of the carina. Carina difficult to localize due to presence of material within the LEFT mainstem  bronchus and likely potentially within the tracheal air column. Tip of the endotracheal tube is approximately 1.7 cm below the clavicular heads. 2. Constellation of findings again raising the question of aspiration pneumonia. Given endobronchial material would suggest pulmonary consultation. 3. Follow-up is suggested to ensure resolution of above findings. Electronically Signed   By: Zetta Bills M.D.   On: 09/11/2021 15:17   DG Chest Port 1 View  Result Date: 09/11/2021 CLINICAL DATA:  Placement of central venous catheter EXAM: PORTABLE CHEST 1 VIEW COMPARISON:  Previous studies including the examination done earlier today FINDINGS: There is decreased volume in the left lung. There is shift of mediastinum to the left due to decreased volume in the left lung. There is large infiltrate in left lower lung field obscuring the left hemidiaphragm. There are no signs of pulmonary edema or focal infiltrates in right lung. There is interval placement of right IJ central venous catheter with its tip in superior vena cava. There is no pneumothorax. IMPRESSION: Increased density in left lower lung field suggests pleural effusion and atelectasis/pneumonia. There is decreased volume in the left lung. Tip of right IJ central venous catheter is seen in superior vena cava. There is no pneumothorax. Electronically Signed   By: Elmer Picker M.D.   On: 09/11/2021 08:10   CT HEAD WO CONTRAST (5MM)  Result Date: 09/11/2021 CLINICAL DATA:  Neck trauma, dangerous injury mechanism with head trauma. Pulmonary embolism suspected. High probability. EXAM: CT HEAD WITHOUT CONTRAST CT CERVICAL SPINE WITHOUT CONTRAST CT ANGIOGRAPHY CHEST WITH CONTRAST TECHNIQUE: Multidetector CT imaging of the head was performed using the standard protocol without the use of intravenous contrast. Multiplanar CT image reconstructions were created from the axial data set. Multidetector CT imaging of the cervical spine was performed using the  standard protocol without the use of intravenous contrast. Multiplanar CT image reconstructions were created from the axial data set. Multidetector CT imaging of the chest was performed using the standard protocol during bolus administration of intravenous contrast. Multiplanar CT image reconstructions and MIPs were obtained to evaluate the vascular anatomy. RADIATION DOSE REDUCTION: This exam was performed according to the departmental dose-optimization program which includes automated exposure control, adjustment of the mA and/or kV according to patient size and/or use of iterative reconstruction technique. CONTRAST:  75 mL Isovue 370 IV. COMPARISON:  No prior head CT or cervical spine CT for comparison. Comparison is made with portable chest today, portable chest 08/29/2021, CTA chest 03/26/2015. FINDINGS: CT HEAD WITHOUT CONTRAST FINDINGS Brain: There is mild cerebral atrophy. Cerebellum and brainstem are unremarkable. Gray-white matter differentiation and attenuation appear normal. No infarct, hemorrhage or mass are seen. The ventricles are normal in size and position. Vascular: No hyperdense vessels are unexpected calcifications. Skull: No visible scalp hematoma. Negative for fractures or focal lesions. Sinuses/orbits: Unremarkable orbital contents. There are scattered opacities in the ethmoid air cells. Other sinuses, bilateral mastoid air cells are clear. Other: None. CT CERVICAL SPINE FINDINGS Alignment: Normal. Skull base and vertebrae: Intact, with osteopenia. No focal or significant bone lesion. Discogenic Schmorl's nodes C6-7 incidentally noted. Soft tissues and spinal canal: No spinal canal hematoma is seen. There is no precervical soft tissue swelling. There are mild calcifications in the proximal cervical ICAs. Disc levels: Mild disc space loss C3-4 and C5-6, mild to moderate disc narrowing C6-7 with small bidirectional osteophytes at C5-6 and C6-7. No herniated discs or cord compromise. No  significant facet hypertrophy or foraminal narrowing. Other: None. CTA CHEST FINDINGS Cardiovascular: Mild cardiomegaly is increased from 2017. There previously was a pericardial effusion which is no longer seen. There are trace calcifications in the right coronary artery. There are no other visible coronary calcifications. There is aortic atherosclerosis without stenosis, aneurysm or dissection. The great vessels are clear. Ectatic pulmonary trunk 3.6 cm indicating arterial hypertension, previously 2.9 cm. No arterial embolism is seen. The pulmonary veins are normal caliber. Mediastinum/Nodes: Left chest volume loss with left mediastinal shift. There are mildly enlarged lymph nodes along the left hilum, largest 1.4 cm in short axis. No bulky or encasing adenopathy. There are left-sided prevascular lymph nodes up to 9 mm, subcarinal nodes up to 1.1 cm. Thyroid gland markedly atrophic without mass. There is no axillary mass. Thoracic esophagus is unremarkable. The tracheal air column is clear. Lungs/Pleura: Small left pleural effusion extends subpulmonic, some layering posteriorly. No right pleural fluid or pneumothorax. The lungs are moderately emphysematous with centrilobular changes predominating. There is fluid and debris obstructing the mid to distal left main bronchus and extending into the lower lobe first order segmental bronchi. The upper lobe main bronchi are not well seen and probably also contain fluid and debris. There is consolidation in the inferior/lateral lingula, dense consolidation with air bronchograms in left lower lobe basal segments, some degree of atelectasis also evidenced by leftward mediastinal shift. Right lung central bronchi are  patent. There is bronchial thickening in the right middle lobe with consolidation in the medial segment medially. There is additional small paraspinal consolidation in the right lower lobe medial basal segment. There is an irregular 7 mm partially pleural-based  nodule in the lateral aspect of the right upper lobe apex on 6:36. Remaining lungs are clear. Upper Abdomen: There is mild hepatic steatosis. No acute abnormality. Musculoskeletal: Mild thoracic kyphodextroscoliosis. The ribcage is intact. Osteopenia and thoracic spondylosis. Review of the MIP images confirms the above findings. IMPRESSION: 1. No acute intracranial CT findings or depressed skull fractures. 2. Scattered ethmoid sinus opacification. 3. Osteopenia and degenerative change of the cervical spine without evidence of fractures or malalignment. 4. Cardiomegaly and prominent pulmonary trunk. No arterial embolism is seen. Trace CAD. 5. Aortic atherosclerosis. 6. Fluid and debris obstructing the left main bronchus and extending into the left upper and lower lobes with left lung volume loss as well as lingular and left lower lobe basal consolidation, with underlying pleural effusion. Findings compatible with a postobstructive pneumonia possibly related to aspiration, with likely reactive adenopathy. A follow-up study is recommended to ensure clearing after treatment. 7. Additional areas of consolidation in the right middle and lower lobes. COPD. 8. 7 mm right upper lobe irregular nodule not seen in 2017. Per Fleischner guidelines, follow-up chest CT is recommended 6-12 months, then another follow-up CT at 18-24 months. These guidelines do not apply to cancer patients and immunocompromised patients. For lung cancer screening, adhere to Lung-RADS guidelines. Electronically Signed   By: Telford Nab M.D.   On: 09/11/2021 07:46   CT CERVICAL SPINE WO CONTRAST  Result Date: 09/11/2021 CLINICAL DATA:  Neck trauma, dangerous injury mechanism with head trauma. Pulmonary embolism suspected. High probability. EXAM: CT HEAD WITHOUT CONTRAST CT CERVICAL SPINE WITHOUT CONTRAST CT ANGIOGRAPHY CHEST WITH CONTRAST TECHNIQUE: Multidetector CT imaging of the head was performed using the standard protocol without the use of  intravenous contrast. Multiplanar CT image reconstructions were created from the axial data set. Multidetector CT imaging of the cervical spine was performed using the standard protocol without the use of intravenous contrast. Multiplanar CT image reconstructions were created from the axial data set. Multidetector CT imaging of the chest was performed using the standard protocol during bolus administration of intravenous contrast. Multiplanar CT image reconstructions and MIPs were obtained to evaluate the vascular anatomy. RADIATION DOSE REDUCTION: This exam was performed according to the departmental dose-optimization program which includes automated exposure control, adjustment of the mA and/or kV according to patient size and/or use of iterative reconstruction technique. CONTRAST:  75 mL Isovue 370 IV. COMPARISON:  No prior head CT or cervical spine CT for comparison. Comparison is made with portable chest today, portable chest 08/29/2021, CTA chest 03/26/2015. FINDINGS: CT HEAD WITHOUT CONTRAST FINDINGS Brain: There is mild cerebral atrophy. Cerebellum and brainstem are unremarkable. Gray-white matter differentiation and attenuation appear normal. No infarct, hemorrhage or mass are seen. The ventricles are normal in size and position. Vascular: No hyperdense vessels are unexpected calcifications. Skull: No visible scalp hematoma. Negative for fractures or focal lesions. Sinuses/orbits: Unremarkable orbital contents. There are scattered opacities in the ethmoid air cells. Other sinuses, bilateral mastoid air cells are clear. Other: None. CT CERVICAL SPINE FINDINGS Alignment: Normal. Skull base and vertebrae: Intact, with osteopenia. No focal or significant bone lesion. Discogenic Schmorl's nodes C6-7 incidentally noted. Soft tissues and spinal canal: No spinal canal hematoma is seen. There is no precervical soft tissue swelling. There are mild calcifications in the  proximal cervical ICAs. Disc levels: Mild disc  space loss C3-4 and C5-6, mild to moderate disc narrowing C6-7 with small bidirectional osteophytes at C5-6 and C6-7. No herniated discs or cord compromise. No significant facet hypertrophy or foraminal narrowing. Other: None. CTA CHEST FINDINGS Cardiovascular: Mild cardiomegaly is increased from 2017. There previously was a pericardial effusion which is no longer seen. There are trace calcifications in the right coronary artery. There are no other visible coronary calcifications. There is aortic atherosclerosis without stenosis, aneurysm or dissection. The great vessels are clear. Ectatic pulmonary trunk 3.6 cm indicating arterial hypertension, previously 2.9 cm. No arterial embolism is seen. The pulmonary veins are normal caliber. Mediastinum/Nodes: Left chest volume loss with left mediastinal shift. There are mildly enlarged lymph nodes along the left hilum, largest 1.4 cm in short axis. No bulky or encasing adenopathy. There are left-sided prevascular lymph nodes up to 9 mm, subcarinal nodes up to 1.1 cm. Thyroid gland markedly atrophic without mass. There is no axillary mass. Thoracic esophagus is unremarkable. The tracheal air column is clear. Lungs/Pleura: Small left pleural effusion extends subpulmonic, some layering posteriorly. No right pleural fluid or pneumothorax. The lungs are moderately emphysematous with centrilobular changes predominating. There is fluid and debris obstructing the mid to distal left main bronchus and extending into the lower lobe first order segmental bronchi. The upper lobe main bronchi are not well seen and probably also contain fluid and debris. There is consolidation in the inferior/lateral lingula, dense consolidation with air bronchograms in left lower lobe basal segments, some degree of atelectasis also evidenced by leftward mediastinal shift. Right lung central bronchi are patent. There is bronchial thickening in the right middle lobe with consolidation in the medial segment  medially. There is additional small paraspinal consolidation in the right lower lobe medial basal segment. There is an irregular 7 mm partially pleural-based nodule in the lateral aspect of the right upper lobe apex on 6:36. Remaining lungs are clear. Upper Abdomen: There is mild hepatic steatosis. No acute abnormality. Musculoskeletal: Mild thoracic kyphodextroscoliosis. The ribcage is intact. Osteopenia and thoracic spondylosis. Review of the MIP images confirms the above findings. IMPRESSION: 1. No acute intracranial CT findings or depressed skull fractures. 2. Scattered ethmoid sinus opacification. 3. Osteopenia and degenerative change of the cervical spine without evidence of fractures or malalignment. 4. Cardiomegaly and prominent pulmonary trunk. No arterial embolism is seen. Trace CAD. 5. Aortic atherosclerosis. 6. Fluid and debris obstructing the left main bronchus and extending into the left upper and lower lobes with left lung volume loss as well as lingular and left lower lobe basal consolidation, with underlying pleural effusion. Findings compatible with a postobstructive pneumonia possibly related to aspiration, with likely reactive adenopathy. A follow-up study is recommended to ensure clearing after treatment. 7. Additional areas of consolidation in the right middle and lower lobes. COPD. 8. 7 mm right upper lobe irregular nodule not seen in 2017. Per Fleischner guidelines, follow-up chest CT is recommended 6-12 months, then another follow-up CT at 18-24 months. These guidelines do not apply to cancer patients and immunocompromised patients. For lung cancer screening, adhere to Lung-RADS guidelines. Electronically Signed   By: Telford Nab M.D.   On: 09/11/2021 07:46   CT Angio Chest Pulmonary Embolism (PE) W or WO Contrast  Result Date: 09/11/2021 CLINICAL DATA:  Neck trauma, dangerous injury mechanism with head trauma. Pulmonary embolism suspected. High probability. EXAM: CT HEAD WITHOUT  CONTRAST CT CERVICAL SPINE WITHOUT CONTRAST CT ANGIOGRAPHY CHEST WITH CONTRAST TECHNIQUE: Multidetector  CT imaging of the head was performed using the standard protocol without the use of intravenous contrast. Multiplanar CT image reconstructions were created from the axial data set. Multidetector CT imaging of the cervical spine was performed using the standard protocol without the use of intravenous contrast. Multiplanar CT image reconstructions were created from the axial data set. Multidetector CT imaging of the chest was performed using the standard protocol during bolus administration of intravenous contrast. Multiplanar CT image reconstructions and MIPs were obtained to evaluate the vascular anatomy. RADIATION DOSE REDUCTION: This exam was performed according to the departmental dose-optimization program which includes automated exposure control, adjustment of the mA and/or kV according to patient size and/or use of iterative reconstruction technique. CONTRAST:  75 mL Isovue 370 IV. COMPARISON:  No prior head CT or cervical spine CT for comparison. Comparison is made with portable chest today, portable chest 08/29/2021, CTA chest 03/26/2015. FINDINGS: CT HEAD WITHOUT CONTRAST FINDINGS Brain: There is mild cerebral atrophy. Cerebellum and brainstem are unremarkable. Gray-white matter differentiation and attenuation appear normal. No infarct, hemorrhage or mass are seen. The ventricles are normal in size and position. Vascular: No hyperdense vessels are unexpected calcifications. Skull: No visible scalp hematoma. Negative for fractures or focal lesions. Sinuses/orbits: Unremarkable orbital contents. There are scattered opacities in the ethmoid air cells. Other sinuses, bilateral mastoid air cells are clear. Other: None. CT CERVICAL SPINE FINDINGS Alignment: Normal. Skull base and vertebrae: Intact, with osteopenia. No focal or significant bone lesion. Discogenic Schmorl's nodes C6-7 incidentally noted. Soft  tissues and spinal canal: No spinal canal hematoma is seen. There is no precervical soft tissue swelling. There are mild calcifications in the proximal cervical ICAs. Disc levels: Mild disc space loss C3-4 and C5-6, mild to moderate disc narrowing C6-7 with small bidirectional osteophytes at C5-6 and C6-7. No herniated discs or cord compromise. No significant facet hypertrophy or foraminal narrowing. Other: None. CTA CHEST FINDINGS Cardiovascular: Mild cardiomegaly is increased from 2017. There previously was a pericardial effusion which is no longer seen. There are trace calcifications in the right coronary artery. There are no other visible coronary calcifications. There is aortic atherosclerosis without stenosis, aneurysm or dissection. The great vessels are clear. Ectatic pulmonary trunk 3.6 cm indicating arterial hypertension, previously 2.9 cm. No arterial embolism is seen. The pulmonary veins are normal caliber. Mediastinum/Nodes: Left chest volume loss with left mediastinal shift. There are mildly enlarged lymph nodes along the left hilum, largest 1.4 cm in short axis. No bulky or encasing adenopathy. There are left-sided prevascular lymph nodes up to 9 mm, subcarinal nodes up to 1.1 cm. Thyroid gland markedly atrophic without mass. There is no axillary mass. Thoracic esophagus is unremarkable. The tracheal air column is clear. Lungs/Pleura: Small left pleural effusion extends subpulmonic, some layering posteriorly. No right pleural fluid or pneumothorax. The lungs are moderately emphysematous with centrilobular changes predominating. There is fluid and debris obstructing the mid to distal left main bronchus and extending into the lower lobe first order segmental bronchi. The upper lobe main bronchi are not well seen and probably also contain fluid and debris. There is consolidation in the inferior/lateral lingula, dense consolidation with air bronchograms in left lower lobe basal segments, some degree of  atelectasis also evidenced by leftward mediastinal shift. Right lung central bronchi are patent. There is bronchial thickening in the right middle lobe with consolidation in the medial segment medially. There is additional small paraspinal consolidation in the right lower lobe medial basal segment. There is an irregular 7  mm partially pleural-based nodule in the lateral aspect of the right upper lobe apex on 6:36. Remaining lungs are clear. Upper Abdomen: There is mild hepatic steatosis. No acute abnormality. Musculoskeletal: Mild thoracic kyphodextroscoliosis. The ribcage is intact. Osteopenia and thoracic spondylosis. Review of the MIP images confirms the above findings. IMPRESSION: 1. No acute intracranial CT findings or depressed skull fractures. 2. Scattered ethmoid sinus opacification. 3. Osteopenia and degenerative change of the cervical spine without evidence of fractures or malalignment. 4. Cardiomegaly and prominent pulmonary trunk. No arterial embolism is seen. Trace CAD. 5. Aortic atherosclerosis. 6. Fluid and debris obstructing the left main bronchus and extending into the left upper and lower lobes with left lung volume loss as well as lingular and left lower lobe basal consolidation, with underlying pleural effusion. Findings compatible with a postobstructive pneumonia possibly related to aspiration, with likely reactive adenopathy. A follow-up study is recommended to ensure clearing after treatment. 7. Additional areas of consolidation in the right middle and lower lobes. COPD. 8. 7 mm right upper lobe irregular nodule not seen in 2017. Per Fleischner guidelines, follow-up chest CT is recommended 6-12 months, then another follow-up CT at 18-24 months. These guidelines do not apply to cancer patients and immunocompromised patients. For lung cancer screening, adhere to Lung-RADS guidelines. Electronically Signed   By: Telford Nab M.D.   On: 09/11/2021 07:46   DG Chest Port 1 View  Result Date:  09/11/2021 CLINICAL DATA:  Shortness of breath and weakness. EXAM: PORTABLE CHEST 1 VIEW COMPARISON:  Portable chest 08/29/2021. FINDINGS: There is increased patchy consolidation in the left lower lung field, small underlying left pleural effusion, findings consistent with left lower lobe pneumonia and parapneumonic effusion. The remaining lungs clear with COPD change. There is mild cardiomegaly without evidence of CHF. Tortuous aorta with stable mediastinum. Osteopenia. Patient is rotated to the right. IMPRESSION: Left lower lobe patchy consolidation and small underlying pleural fluid consistent with left lower lobe pneumonia or aspiration and parapneumonic effusion. Clinical correlation and radiographic follow-up recommended.  COPD. Electronically Signed   By: Telford Nab M.D.   On: 09/11/2021 05:30     Labs:   Basic Metabolic Panel: Recent Labs  Lab 09/20/21 0518 09/21/21 0551 09/22/21 0335 09/23/21 0441 09/24/21 0640  NA 143 141 140 140 141  K 3.7 3.6 3.2* 3.5 3.7  CL 91* 92* 87* 87* 89*  CO2 43* 44* >45* >45* 42*  GLUCOSE 83 92 130* 145* 93  BUN 5* 7* 9 8 5*  CREATININE 0.75 0.67 0.60 0.59 0.66  CALCIUM 8.9 8.6* 8.5* 9.0 8.9  MG  --  1.9 2.0 2.0 2.1   GFR Estimated Creatinine Clearance: 94.2 mL/min (by C-G formula based on SCr of 0.66 mg/dL). Liver Function Tests: No results for input(s): "AST", "ALT", "ALKPHOS", "BILITOT", "PROT", "ALBUMIN" in the last 168 hours. No results for input(s): "LIPASE", "AMYLASE" in the last 168 hours. No results for input(s): "AMMONIA" in the last 168 hours. Coagulation profile No results for input(s): "INR", "PROTIME" in the last 168 hours.  CBC: Recent Labs  Lab 09/20/21 0518 09/21/21 0551 09/22/21 0335 09/23/21 0441 09/24/21 0640  WBC 3.7* 4.1 5.3 5.7 5.9  NEUTROABS 1.5*  --   --   --   --   HGB 8.4* 8.3* 8.4* 8.0* 8.5*  HCT 28.3* 28.1* 28.4* 27.7* 28.4*  MCV 106.8* 108.1* 106.8* 107.8* 106.0*  PLT 157 157 148* 158 164   Cardiac  Enzymes: No results for input(s): "CKTOTAL", "CKMB", "CKMBINDEX", "TROPONINI" in  the last 168 hours. BNP: Invalid input(s): "POCBNP" CBG: Recent Labs  Lab 09/24/21 2003 09/25/21 0025 09/25/21 0414 09/25/21 1134 09/25/21 1542  GLUCAP 101* 99 86 122* 101*   D-Dimer No results for input(s): "DDIMER" in the last 72 hours. Hgb A1c No results for input(s): "HGBA1C" in the last 72 hours. Lipid Profile No results for input(s): "CHOL", "HDL", "LDLCALC", "TRIG", "CHOLHDL", "LDLDIRECT" in the last 72 hours. Thyroid function studies No results for input(s): "TSH", "T4TOTAL", "T3FREE", "THYROIDAB" in the last 72 hours.  Invalid input(s): "FREET3" Anemia work up No results for input(s): "VITAMINB12", "FOLATE", "FERRITIN", "TIBC", "IRON", "RETICCTPCT" in the last 72 hours. Microbiology Recent Results (from the past 240 hour(s))  Body fluid culture w Gram Stain     Status: None   Collection Time: 09/18/21  1:42 PM   Specimen: Pleural Fluid  Result Value Ref Range Status   Specimen Description PLEURAL  Final   Special Requests NONE  Final   Gram Stain   Final    RARE WBC PRESENT,BOTH PMN AND MONONUCLEAR NO ORGANISMS SEEN    Culture   Final    NO GROWTH 3 DAYS Performed at [facility], [address], [place], [place] [address]    Report Status 09/21/2021 FINAL  Final     Discharge Instructions:   Discharge Instructions     Diet general   Complete by: As directed    Discharge instructions   Complete by: As directed    Follow-up with your primary care provider in 1 week.  Please do not smoke.  Continue inhalers as prescribed.  No overexertion.  Seek medical attention for worsening symptoms. Continue oxygen at home.   Increase activity slowly   Complete by: As directed       Allergies as of 09/25/2021       Reactions   Estrogens Hives   Mustard Seed Hives   Strawberry Extract Hives        Medication List     TAKE these medications    albuterol (2.5 MG/3ML)  0.083% nebulizer solution Commonly known as: PROVENTIL Take 3 mLs (2.5 mg total) by nebulization every 6 (six) hours as needed for wheezing or shortness of breath.   albuterol 108 (90 Base) MCG/ACT inhaler Commonly known as: ProAir HFA 2 puffs every 4 hours as needed only  if your can't catch your breath   amLODipine 10 MG tablet Commonly known as: NORVASC Take 10 mg by mouth daily.   atorvastatin 20 MG tablet Commonly known as: LIPITOR Take 20 mg by mouth at bedtime.   bethanechol 5 MG tablet Commonly known as: URECHOLINE Take 5 mg by mouth 2 (two) times daily.   cyclobenzaprine 5 MG tablet Commonly known as: FLEXERIL Take 5 mg by mouth 3 (three) times daily.   dextromethorphan-guaiFENesin 30-600 MG 12hr tablet Commonly known as: MUCINEX DM Take 1 tablet by mouth 2 (two) times daily for 10 days.   escitalopram 10 MG tablet Commonly known as: LEXAPRO Take 1 tablet by mouth daily.   fluticasone 110 MCG/ACT inhaler Commonly known as: FLOVENT HFA Inhale 2 puffs into the lungs 2 (two) times daily.   folic acid 1 MG tablet Commonly known as: FOLVITE Take 1 tablet (1 mg total) by mouth daily.   furosemide 40 MG tablet Commonly known as: LASIX Take 1 tablet (40 mg total) by mouth daily.   hydrocortisone cream 1 % Apply topically 2 (two) times daily.   hydrOXYzine 10 MG tablet Commonly known as:  ATARAX Take 1 tablet (10 mg total) by mouth 3 (three) times daily as needed for anxiety.   levothyroxine 200 MCG tablet Commonly known as: SYNTHROID Take 1 tablet (200 mcg total) by mouth daily at 6 (six) AM.   liothyronine 25 MCG tablet Commonly known as: CYTOMEL Take 25 mcg by mouth daily.   loratadine 10 MG tablet Commonly known as: CLARITIN Take 10 mg by mouth daily.   metoprolol tartrate 25 MG tablet Commonly known as: LOPRESSOR Take 0.5 tablets (12.5 mg total) by mouth 2 (two) times daily.   oxyCODONE-acetaminophen 10-325 MG tablet Commonly known as:  PERCOCET Take 1 tablet by mouth 4 (four) times daily.   pantoprazole 40 MG tablet Commonly known as: PROTONIX Take 1 tablet (40 mg total) by mouth daily.   SUMAtriptan 50 MG tablet Commonly known as: IMITREX Take 50 mg by mouth every 2 (two) hours as needed for migraine.   topiramate 100 MG tablet Commonly known as: TOPAMAX Take 100 mg by mouth 2 (two) times daily.   umeclidinium bromide 62.5 MCG/ACT Aepb Commonly known as: INCRUSE ELLIPTA Inhale 1 puff into the lungs daily.   Xarelto 20 MG Tabs tablet Generic drug: rivaroxaban Take 20 mg by mouth daily.        Follow-up Information     [facility] Follow up.   Specialty: Rehabilitation Why: outpatient physical therapy , will call you to set up apt times. Contact information: [address] [id number] Prudy Feeler [place] [id number] [place] Follow up.   Why: HHPT, HHOT, [facility] will call you to set up apt times  Ethel        [name], NP. Go on 10/18/2021.   Specialty: Family Medicine Why: '@1'$ :30pm Contact information: [address] [place] [address] [phone]         [facility] Follow up.   Why: light weight w/chair Contact information: [place] [address] [phone]                  Time coordinating discharge: 39 minutes  Signed:  Davinder Haff  [facility] 09/26/2021, 10:56 AM

## 2021-10-03 ENCOUNTER — Encounter: Payer: Medicaid Other | Admitting: Orthopedic Surgery

## 2021-10-03 ENCOUNTER — Encounter: Payer: Self-pay | Admitting: Orthopedic Surgery

## 2021-10-19 ENCOUNTER — Ambulatory Visit: Payer: Medicaid Other | Attending: Audiology | Admitting: Audiology

## 2021-12-14 ENCOUNTER — Emergency Department: Payer: Medicaid Other

## 2021-12-14 ENCOUNTER — Inpatient Hospital Stay
Admission: EM | Admit: 2021-12-14 | Discharge: 2021-12-18 | DRG: 190 | Disposition: A | Discharge status: Home / Self Care | Payer: Medicaid Other | Attending: Internal Medicine | Admitting: Internal Medicine

## 2021-12-14 DIAGNOSIS — J9622 Acute and chronic respiratory failure with hypercapnia: Secondary | ICD-10-CM | POA: Diagnosis present

## 2021-12-14 DIAGNOSIS — F411 Generalized anxiety disorder: Secondary | ICD-10-CM | POA: Diagnosis present

## 2021-12-14 DIAGNOSIS — J441 Chronic obstructive pulmonary disease with (acute) exacerbation: Secondary | ICD-10-CM | POA: Diagnosis not present

## 2021-12-14 DIAGNOSIS — E119 Type 2 diabetes mellitus without complications: Secondary | ICD-10-CM | POA: Diagnosis present

## 2021-12-14 DIAGNOSIS — E039 Hypothyroidism, unspecified: Secondary | ICD-10-CM | POA: Diagnosis present

## 2021-12-14 DIAGNOSIS — E079 Disorder of thyroid, unspecified: Secondary | ICD-10-CM | POA: Diagnosis present

## 2021-12-14 DIAGNOSIS — I5032 Chronic diastolic (congestive) heart failure: Secondary | ICD-10-CM | POA: Diagnosis present

## 2021-12-14 DIAGNOSIS — G4733 Obstructive sleep apnea (adult) (pediatric): Secondary | ICD-10-CM | POA: Diagnosis present

## 2021-12-14 DIAGNOSIS — F418 Other specified anxiety disorders: Secondary | ICD-10-CM

## 2021-12-14 DIAGNOSIS — G9341 Metabolic encephalopathy: Secondary | ICD-10-CM | POA: Diagnosis not present

## 2021-12-14 DIAGNOSIS — J9621 Acute and chronic respiratory failure with hypoxia: Secondary | ICD-10-CM | POA: Diagnosis present

## 2021-12-14 DIAGNOSIS — Z1152 Encounter for screening for COVID-19: Secondary | ICD-10-CM

## 2021-12-14 DIAGNOSIS — R4182 Altered mental status, unspecified: Principal | ICD-10-CM | POA: Diagnosis present

## 2021-12-14 DIAGNOSIS — J9602 Acute respiratory failure with hypercapnia: Secondary | ICD-10-CM

## 2021-12-14 DIAGNOSIS — I480 Paroxysmal atrial fibrillation: Secondary | ICD-10-CM | POA: Diagnosis present

## 2021-12-14 DIAGNOSIS — F32A Depression, unspecified: Secondary | ICD-10-CM | POA: Diagnosis present

## 2021-12-14 DIAGNOSIS — Z9981 Dependence on supplemental oxygen: Secondary | ICD-10-CM

## 2021-12-14 DIAGNOSIS — Z7901 Long term (current) use of anticoagulants: Secondary | ICD-10-CM

## 2021-12-14 DIAGNOSIS — A419 Sepsis, unspecified organism: Secondary | ICD-10-CM

## 2021-12-14 DIAGNOSIS — I110 Hypertensive heart disease with heart failure: Secondary | ICD-10-CM | POA: Diagnosis present

## 2021-12-14 DIAGNOSIS — F17210 Nicotine dependence, cigarettes, uncomplicated: Secondary | ICD-10-CM | POA: Diagnosis present

## 2021-12-14 DIAGNOSIS — Z91199 Patient's noncompliance with other medical treatment and regimen due to unspecified reason: Secondary | ICD-10-CM

## 2021-12-14 DIAGNOSIS — G8929 Other chronic pain: Secondary | ICD-10-CM | POA: Diagnosis present

## 2021-12-14 DIAGNOSIS — G43909 Migraine, unspecified, not intractable, without status migrainosus: Secondary | ICD-10-CM | POA: Diagnosis present

## 2021-12-14 LAB — CBC WITH DIFFERENTIAL/PLATELET
Abs Immature Granulocytes: 0.05 10*3/uL (ref 0.00–0.07)
Basophils Absolute: 0 10*3/uL (ref 0.0–0.1)
Basophils Relative: 1 %
Eosinophils Absolute: 0.1 10*3/uL (ref 0.0–0.5)
Eosinophils Relative: 1 %
HCT: 43.8 % (ref 36.0–46.0)
Hemoglobin: 11.7 g/dL — ABNORMAL LOW (ref 12.0–15.0)
Immature Granulocytes: 1 %
Lymphocytes Relative: 33 %
Lymphs Abs: 1.7 10*3/uL (ref 0.7–4.0)
MCH: 30.4 pg (ref 26.0–34.0)
MCHC: 26.7 g/dL — ABNORMAL LOW (ref 30.0–36.0)
MCV: 113.8 fL — ABNORMAL HIGH (ref 80.0–100.0)
Monocytes Absolute: 0.3 10*3/uL (ref 0.1–1.0)
Monocytes Relative: 5 %
Neutro Abs: 3 10*3/uL (ref 1.7–7.7)
Neutrophils Relative %: 59 %
Platelets: 89 10*3/uL — ABNORMAL LOW (ref 150–400)
RBC: 3.85 MIL/uL — ABNORMAL LOW (ref 3.87–5.11)
RDW: 13.8 % (ref 11.5–15.5)
WBC: 5 10*3/uL (ref 4.0–10.5)
nRBC: 0 % (ref 0.0–0.2)

## 2021-12-14 LAB — BLOOD GAS, ARTERIAL
Acid-Base Excess: 15.1 mmol/L — ABNORMAL HIGH (ref 0.0–2.0)
Bicarbonate: 48 mmol/L — ABNORMAL HIGH (ref 20.0–28.0)
O2 Content: 4 L/min
O2 Saturation: 21.2 %
Patient temperature: 37
pCO2 arterial: 120 mmHg (ref 32–48)
pH, Arterial: 7.21 — ABNORMAL LOW (ref 7.35–7.45)
pO2, Arterial: <31 mmHg — CL (ref 83–108)

## 2021-12-14 LAB — COMPREHENSIVE METABOLIC PANEL
ALT: 9 U/L (ref 0–44)
AST: 14 U/L — ABNORMAL LOW (ref 15–41)
Albumin: 3.5 g/dL (ref 3.5–5.0)
Alkaline Phosphatase: 94 U/L (ref 38–126)
Anion gap: 6 (ref 5–15)
BUN: 13 mg/dL (ref 8–23)
CO2: 40 mmol/L — ABNORMAL HIGH (ref 22–32)
Calcium: 8.8 mg/dL — ABNORMAL LOW (ref 8.9–10.3)
Chloride: 99 mmol/L (ref 98–111)
Creatinine, Ser: 0.66 mg/dL (ref 0.44–1.00)
GFR, Estimated: >60 mL/min (ref >60)
Glucose, Bld: 115 mg/dL — ABNORMAL HIGH (ref 70–99)
Potassium: 4.1 mmol/L (ref 3.5–5.1)
Sodium: 145 mmol/L (ref 135–145)
Total Bilirubin: 0.5 mg/dL (ref 0.3–1.2)
Total Protein: 6.5 g/dL (ref 6.5–8.1)

## 2021-12-14 LAB — BLOOD GAS, VENOUS
Acid-Base Excess: 14.8 mmol/L — ABNORMAL HIGH (ref 0.0–2.0)
Bicarbonate: 47.1 mmol/L — ABNORMAL HIGH (ref 20.0–28.0)
Delivery systems: POSITIVE
FIO2: 35 %
O2 Saturation: 26.8 %
Patient temperature: 37
RATE: 12 resp/min
pCO2, Ven: 115 mmHg (ref 44–60)
pH, Ven: 7.22 — ABNORMAL LOW (ref 7.25–7.43)
pO2, Ven: <31 mmHg — CL (ref 32–45)

## 2021-12-14 LAB — RESP PANEL BY RT-PCR (RSV, FLU A&B, COVID)  RVPGX2
Influenza A by PCR: NEGATIVE
Influenza B by PCR: NEGATIVE
Resp Syncytial Virus by PCR: NEGATIVE
SARS Coronavirus 2 by RT PCR: NEGATIVE

## 2021-12-14 LAB — LACTIC ACID, PLASMA: Lactic Acid, Venous: 1.1 mmol/L (ref 0.5–1.9)

## 2021-12-14 MED ORDER — METHYLPREDNISOLONE SODIUM SUCC 125 MG IJ SOLR: 125.0000 mg | Freq: Once | INTRAMUSCULAR | Status: DC

## 2021-12-14 MED ORDER — VANCOMYCIN HCL IN DEXTROSE 1-5 GM/200ML-% IV SOLN
1000.0000 mg | Freq: Once | INTRAVENOUS | Status: AC
  Administered 2021-12-14: 1000 mg via INTRAVENOUS
  Filled 2021-12-14: qty 200

## 2021-12-14 MED ORDER — IPRATROPIUM-ALBUTEROL 0.5-2.5 (3) MG/3ML IN SOLN
3.0000 mL | Freq: Once | RESPIRATORY_TRACT | Status: AC
  Administered 2021-12-14: 3 mL via RESPIRATORY_TRACT
  Filled 2021-12-14: qty 3

## 2021-12-14 MED ORDER — LORAZEPAM 2 MG/ML IJ SOLN
0.5000 mg | Freq: Once | INTRAMUSCULAR | Status: AC
  Administered 2021-12-14: 0.5 mg via INTRAVENOUS
  Filled 2021-12-14: qty 1

## 2021-12-14 MED ORDER — SODIUM CHLORIDE 0.9 % IV SOLN
2.0000 g | Freq: Once | INTRAVENOUS | Status: AC
  Administered 2021-12-14: 2 g via INTRAVENOUS
  Filled 2021-12-14: qty 12.5

## 2021-12-14 MED ORDER — IPRATROPIUM-ALBUTEROL 0.5-2.5 (3) MG/3ML IN SOLN
6.0000 mL | Freq: Once | RESPIRATORY_TRACT | Status: AC
  Administered 2021-12-14: 6 mL via RESPIRATORY_TRACT
  Filled 2021-12-14: qty 6

## 2021-12-14 NOTE — H&P (Signed)
NAME:  Terri Casey, MRN:  [id number], DOB:  [birth year], LOS: 0 ADMISSION DATE:  12/14/2021, CONSULTATION DATE:  12/14/2021 REFERRING MD:  Iva Lento MD, CHIEF COMPLAINT:  COPD exacerbation   History of Present Illness:   61 year old with history of asthma, COPD, chronic respiratory failure on home oxygen, diabetes, hypertension, diastolic heart failure, paroxysmal atrial fibrillation, OSA presenting today with respiratory distress, hypoxic, hypercapnic respiratory failure was placed on BiPAP by EDP and PCCM consulted for evaluation.  She has been given Solu-Medrol, Vanco, cefepime, bronchodilators and Ativan for anxiety.  She had a recent admission in September 2023 with septic shock, aspiration, acute on chronic respiratory failure Pertinent  Medical History    has a past medical history of Anxiety, Asthma, CHF (congestive heart failure) ([place]), COPD (chronic obstructive pulmonary disease) ([place]), Diabetes mellitus without complication ([place]), Hypertension, and Thyroid disease.   Significant Hospital Events: Including procedures, antibiotic start and stop dates in addition to other pertinent events     Interim History / Subjective:    Objective   Blood pressure (!) 120/56, pulse 76, temperature 98 F (36.7 C), resp. rate (!) 23, SpO2 94 %.    FiO2 (%):  [35 %-45 %] 45 %   Intake/Output Summary (Last 24 hours) at 12/14/2021 2349 Last data filed at 12/14/2021 2223 Gross per 24 hour  Intake 100 ml  Output --  Net 100 ml   There were no vitals filed for this visit.  Examination: Blood pressure (!) 120/56, pulse 76, temperature 98 F (36.7 C), resp. rate (!) 23, SpO2 94 %. Gen:      No acute distress, On Bipap HEENT:  EOMI, sclera anicteric Neck:     No masses; no thyromegaly Lungs:    Clear to auscultation bilaterally; normal respiratory effort CV:         Regular rate and rhythm; no murmurs Abd:      + bowel sounds; soft, non-tender; no palpable masses, no  distension Ext:    No edema; adequate peripheral perfusion Skin:      Warm and dry; no rash Neuro: Arousable, answers questions  Labs/imaging reviewed Significant for pCO2 121 on VBG Platelets 89, hemoglobin 11.7 Creatinine 0.66 Chest x-ray with bibasilar opacities. CT head with no acute abnormalities  Resolved Hospital Problem list     Assessment & Plan:  Acute on chronic hypoxic, hypercarbic respiratory failure secondary to COPD exacerbation Admit to ICU Continue BiPAP, follow ABG Monitor for intubation needs Continue IV Solu-Medrol, bronco dilators Ceftriaxone, azithromycin for community-acquired pneumonia coverage  Diastolic heart failure, paroxysmal atrial fibrillation Telemetry monitoring Hold outpatient cardiac medications  Hypothyroidism Continue Synthroid IV  History of migraines, chronic pain Hold home Percocet and Flexeril.  Depression anxiety Hold Lexapro  Best Practice (right click and "Reselect all SmartList Selections" daily)   Diet/type: NPO DVT prophylaxis: LMWH GI prophylaxis: N/A Lines: N/A Foley:  N/A Code Status:  full code Last date of multidisciplinary goals of care discussion '[]'$   Critical care time:    The patient is critically ill with multiple organ system failure and requires high complexity decision making for assessment and support, frequent evaluation and titration of therapies, advanced monitoring, review of radiographic studies and interpretation of complex data.   Critical Care Time devoted to patient care services, exclusive of separately billable procedures, described in this note is 35 minutes.   Marshell Garfinkel MD [facility] See Amion for pager  If no response to pager , please call [phone]  until 7pm After 7:00 pm call Elink  [phone] 12/15/2021, 12:23 AM

## 2021-12-14 NOTE — ED Triage Notes (Addendum)
Pt arrives EMS from home with reports of respiratory distress. Pt wears home O2 but unsure of how much. On EMS arrival pt was 83% on RA. Pt confused and unable to answer questions appropriately. Pt has hx of COPD per EMS. Ems gave 125 solumedrol and albuterol tx

## 2021-12-14 NOTE — H&P (Incomplete)
   NAME:  Terri Casey, MRN:  [id number], DOB:  31-Jan-1960, LOS: 0 ADMISSION DATE:  12/14/2021, CONSULTATION DATE:  12/14/2021 REFERRING MD:  Iva Lento MD, CHIEF COMPLAINT:  COPD exacerbation   History of Present Illness:   61 year old with history of asthma, COPD, chronic respiratory failure on home oxygen, diabetes, hypertension, diastolic heart failure, paroxysmal atrial fibrillation, OSA presenting today with respiratory distress, hypoxic, hypercapnic respiratory failure was placed on BiPAP by EDP and PCCM consulted for evaluation.  She has been given Solu-Medrol, Vanco, cefepime, bronchodilators and Ativan for anxiety.  She had a recent admission in September 2023 with septic shock, aspiration, acute on chronic respiratory failure Pertinent  Medical History    has a past medical history of Anxiety, Asthma, CHF (congestive heart failure) ([place]), COPD (chronic obstructive pulmonary disease) ([place]), Diabetes mellitus without complication ([place]), Hypertension, and Thyroid disease.   Significant Hospital Events: Including procedures, antibiotic start and stop dates in addition to other pertinent events     Interim History / Subjective:    Objective   Blood pressure (!) 120/56, pulse 76, temperature 98 F (36.7 C), resp. rate (!) 23, SpO2 94 %.    FiO2 (%):  [35 %-45 %] 45 %   Intake/Output Summary (Last 24 hours) at 12/14/2021 2349 Last data filed at 12/14/2021 2223 Gross per 24 hour  Intake 100 ml  Output --  Net 100 ml   There were no vitals filed for this visit.  Examination: Blood pressure (!) 120/56, pulse 76, temperature 98 F (36.7 C), resp. rate (!) 23, SpO2 94 %. Gen:      No acute distress HEENT:  EOMI, sclera anicteric Neck:     No masses; no thyromegaly Lungs:    Clear to auscultation bilaterally; normal respiratory effort*** CV:         Regular rate and rhythm; no murmurs Abd:      + bowel sounds; soft, non-tender; no palpable masses, no distension Ext:     No edema; adequate peripheral perfusion Skin:      Warm and dry; no rash Neuro: alert and oriented x 3 Psych: normal mood and affect   Labs/imaging reviewed Significant for pCO2 121 on VBG Platelets 89, hemoglobin 11.7 Creatinine 0.66 Chest x-ray with bibasilar opacities. CT head with no acute abnormalities  Resolved Hospital Problem list     Assessment & Plan:  Acute on chronic hypoxic, hypercarbic respiratory failure secondary to COPD exacerbation Community-acquired pneumonia   Diastolic heart failure, paroxysmal atrial fibrillation  Hypothyroidism  History of migraines, chronic pain Hold home Percocet and Flexeril.  Depression anxiety Hold Lexapro Obesity   Best Practice (right click and "Reselect all SmartList Selections" daily)   Diet/type: {diet type:25684} DVT prophylaxis: {anticoagulation (Optional):25687} GI prophylaxis: {QM:08676} Lines: {Central Venous Access:25771} Foley:  {Central Venous Access:25691} Code Status:  {Code Status:26939} Last date of multidisciplinary goals of care discussion [***]  Critical care time:

## 2021-12-14 NOTE — ED Provider Notes (Signed)
[facility] Provider Note    Event Date/Time   First MD Initiated Contact with Patient 12/14/21 1937     (approximate)   History   Altered Mental Status   HPI  SIERRIA Casey is a 61 y.o. female who presents to the emergency department today brought in by EMS because of concerns for altered mental status.  Patient herself is unable to give any history.  Per report family noticed patient was more confused today.  Per chart review patient has had admissions for respiratory difficulty and sepsis in the past.     Physical Exam   Triage Vital Signs: ED Triage Vitals [12/14/21 1940]  Enc Vitals Group     BP      Pulse Rate (!) 53     Resp 18     Temp      Temp src      SpO2 93 %     Weight      Height      Head Circumference      Peak Flow      Pain Score 0     Pain Loc      Pain Edu?      Excl. in [employer]?     Most recent vital signs: Vitals:   12/14/21 1940  Pulse: (!) 53  Resp: 18  SpO2: 93%   General: Awake, alert, disoriented. CV:  Good peripheral perfusion. Bradycardia. Resp:  Normal effort. Lungs clear. Abd:  No distention. Non tender.    ED Results / Procedures / Treatments   Labs (all labs ordered are listed, but only abnormal results are displayed) Labs Reviewed  COMPREHENSIVE METABOLIC PANEL - Abnormal; Notable for the following components:      Result Value   CO2 40 (*)    Glucose, Bld 115 (*)    Calcium 8.8 (*)    AST 14 (*)    All other components within normal limits  CBC WITH DIFFERENTIAL/PLATELET - Abnormal; Notable for the following components:   RBC 3.85 (*)    Hemoglobin 11.7 (*)    MCV 113.8 (*)    MCHC 26.7 (*)    Platelets 89 (*)    All other components within normal limits  BLOOD GAS, ARTERIAL - Abnormal; Notable for the following components:   pH, Arterial 7.21 (*)    pCO2 arterial 120 (*)    pO2, Arterial <31 (*)    Bicarbonate 48.0 (*)    Acid-Base Excess 15.1 (*)    All other components within  normal limits  BLOOD GAS, VENOUS - Abnormal; Notable for the following components:   pH, Ven 7.22 (*)    pCO2, Ven 115 (*)    pO2, Ven <31 (*)    Bicarbonate 47.1 (*)    Acid-Base Excess 14.8 (*)    All other components within normal limits  RESP PANEL BY RT-PCR (RSV, FLU A&B, COVID)  RVPGX2  CULTURE, BLOOD (ROUTINE X 2)  CULTURE, BLOOD (ROUTINE X 2)  LACTIC ACID, PLASMA  LACTIC ACID, PLASMA  URINALYSIS, COMPLETE (UACMP) WITH MICROSCOPIC  BLOOD GAS, VENOUS     EKG  I, Nance Pear, attending physician, personally viewed and interpreted this EKG  EKG Time: 1943 Rate: 58 Rhythm: sinus bradycardia Axis: normal Intervals: qtc 440 QRS: narrow ST changes: no st elevation Impression: abnormal ekg   RADIOLOGY I independently interpreted and visualized the CXR. My interpretation: No pneumonia. Radiology interpretation:  IMPRESSION:  Streaky bibasilar opacities, atelectasis versus pneumonia.  Possible  trace left pleural effusion.    I independently interpreted and visualized the CT head. My interpretation: No bleed.  Radiology interpretation:  IMPRESSION:  No acute intracranial abnormalities.    PROCEDURES:  Critical Care performed: Yes, see critical care procedure note(s)  Procedures  CRITICAL CARE Performed by: Nance Pear   Total critical care time: 45 minutes  Critical care time was exclusive of separately billable procedures and treating other patients.  Critical care was necessary to treat or prevent imminent or life-threatening deterioration.  Critical care was time spent personally by me on the following activities: development of treatment plan with patient and/or surrogate as well as nursing, discussions with consultants, evaluation of patient's response to treatment, examination of patient, obtaining history from patient or surrogate, ordering and performing treatments and interventions, ordering and review of laboratory studies, ordering and  review of radiographic studies, pulse oximetry and re-evaluation of patient's condition.   MEDICATIONS ORDERED IN ED: Medications - No data to display   IMPRESSION / MDM / [place] / ED COURSE  I reviewed the triage vital signs and the nursing notes.                              Differential diagnosis includes, but is not limited to, pneumonia, COPD, CHF, covid, influenza, intracranial process.  Patient's presentation is most consistent with acute presentation with potential threat to life or bodily function.  Patient presented to the emergency department today brought in by EMS because of concerns for shortness of breath and altered mental status.  On exam patient is awake and alert however is altered.  She cannot give any significant history.  Patient was given Solu-Medrol by EMS.  Broad workup for altered mental status was initiated.  CT head without concerning findings.  Chest x-ray without any obvious pneumonia.  Blood gas however was concerning for hypercapnia.  Per chart review patient has had hypercapnia in the past.  Patient was placed on BiPAP.  Repeat blood gas with some improvement of the CO2 levels.  I did try contacting family for updates however was unable to obtain them via telephone.  Given concern for high risk of requiring intubation I discussed with Dr. Vaughan Browner with the ICU team who will come evaluate the patient for admission.  FINAL CLINICAL IMPRESSION(S) / ED DIAGNOSES   Final diagnoses:  Altered mental status, unspecified altered mental status type  Acute respiratory failure with hypercapnia ([place])       Note:  This document was prepared using Dragon voice recognition software and may include unintentional dictation errors.    Nance Pear, MD 12/15/21 [phone]

## 2021-12-15 DIAGNOSIS — G43909 Migraine, unspecified, not intractable, without status migrainosus: Secondary | ICD-10-CM | POA: Diagnosis present

## 2021-12-15 DIAGNOSIS — Z7901 Long term (current) use of anticoagulants: Secondary | ICD-10-CM | POA: Diagnosis not present

## 2021-12-15 DIAGNOSIS — A419 Sepsis, unspecified organism: Secondary | ICD-10-CM

## 2021-12-15 DIAGNOSIS — F32A Depression, unspecified: Secondary | ICD-10-CM | POA: Diagnosis present

## 2021-12-15 DIAGNOSIS — F411 Generalized anxiety disorder: Secondary | ICD-10-CM | POA: Diagnosis present

## 2021-12-15 DIAGNOSIS — R4182 Altered mental status, unspecified: Secondary | ICD-10-CM | POA: Diagnosis not present

## 2021-12-15 DIAGNOSIS — I110 Hypertensive heart disease with heart failure: Secondary | ICD-10-CM | POA: Diagnosis present

## 2021-12-15 DIAGNOSIS — F17210 Nicotine dependence, cigarettes, uncomplicated: Secondary | ICD-10-CM | POA: Diagnosis present

## 2021-12-15 DIAGNOSIS — Z9981 Dependence on supplemental oxygen: Secondary | ICD-10-CM | POA: Diagnosis not present

## 2021-12-15 DIAGNOSIS — J9602 Acute respiratory failure with hypercapnia: Secondary | ICD-10-CM | POA: Diagnosis not present

## 2021-12-15 DIAGNOSIS — I480 Paroxysmal atrial fibrillation: Secondary | ICD-10-CM | POA: Diagnosis present

## 2021-12-15 DIAGNOSIS — Z91199 Patient's noncompliance with other medical treatment and regimen due to unspecified reason: Secondary | ICD-10-CM | POA: Diagnosis not present

## 2021-12-15 DIAGNOSIS — Z1152 Encounter for screening for COVID-19: Secondary | ICD-10-CM | POA: Diagnosis not present

## 2021-12-15 DIAGNOSIS — E039 Hypothyroidism, unspecified: Secondary | ICD-10-CM | POA: Diagnosis present

## 2021-12-15 DIAGNOSIS — G9341 Metabolic encephalopathy: Secondary | ICD-10-CM | POA: Diagnosis not present

## 2021-12-15 DIAGNOSIS — R6521 Severe sepsis with septic shock: Secondary | ICD-10-CM

## 2021-12-15 DIAGNOSIS — G8929 Other chronic pain: Secondary | ICD-10-CM | POA: Diagnosis present

## 2021-12-15 DIAGNOSIS — E079 Disorder of thyroid, unspecified: Secondary | ICD-10-CM | POA: Diagnosis present

## 2021-12-15 DIAGNOSIS — G4733 Obstructive sleep apnea (adult) (pediatric): Secondary | ICD-10-CM | POA: Diagnosis present

## 2021-12-15 DIAGNOSIS — J9621 Acute and chronic respiratory failure with hypoxia: Secondary | ICD-10-CM | POA: Diagnosis present

## 2021-12-15 DIAGNOSIS — J9622 Acute and chronic respiratory failure with hypercapnia: Secondary | ICD-10-CM | POA: Diagnosis present

## 2021-12-15 DIAGNOSIS — I5032 Chronic diastolic (congestive) heart failure: Secondary | ICD-10-CM | POA: Diagnosis present

## 2021-12-15 DIAGNOSIS — J441 Chronic obstructive pulmonary disease with (acute) exacerbation: Secondary | ICD-10-CM | POA: Diagnosis not present

## 2021-12-15 DIAGNOSIS — E119 Type 2 diabetes mellitus without complications: Secondary | ICD-10-CM | POA: Diagnosis present

## 2021-12-15 LAB — BLOOD GAS, ARTERIAL
Acid-Base Excess: 13.2 mmol/L — ABNORMAL HIGH (ref 0.0–2.0)
Bicarbonate: 42.5 mmol/L — ABNORMAL HIGH (ref 20.0–28.0)
Delivery systems: POSITIVE
Expiratory PAP: 5 cmH2O
FIO2: 45 %
Inspiratory PAP: 18 cmH2O
O2 Saturation: 98.7 %
Patient temperature: 37
pCO2 arterial: 77 mmHg (ref 32–48)
pH, Arterial: 7.35 (ref 7.35–7.45)
pO2, Arterial: 91 mmHg (ref 83–108)

## 2021-12-15 LAB — BLOOD GAS, VENOUS
Acid-Base Excess: 14.9 mmol/L — ABNORMAL HIGH (ref 0.0–2.0)
Bicarbonate: 47.6 mmol/L — ABNORMAL HIGH (ref 20.0–28.0)
Delivery systems: POSITIVE
FIO2: 45 %
O2 Saturation: 53.7 %
Patient temperature: 37
RATE: 12 resp/min
pCO2, Ven: 111 mmHg (ref 44–60)
pH, Ven: 7.24 — ABNORMAL LOW (ref 7.25–7.43)
pO2, Ven: 35 mmHg (ref 32–45)

## 2021-12-15 LAB — TROPONIN I (HIGH SENSITIVITY): Troponin I (High Sensitivity): 6 ng/L (ref <18)

## 2021-12-15 LAB — BASIC METABOLIC PANEL
Anion gap: 6 (ref 5–15)
BUN: 16 mg/dL (ref 8–23)
CO2: 38 mmol/L — ABNORMAL HIGH (ref 22–32)
Calcium: 8.9 mg/dL (ref 8.9–10.3)
Chloride: 100 mmol/L (ref 98–111)
Creatinine, Ser: 0.7 mg/dL (ref 0.44–1.00)
GFR, Estimated: >60 mL/min (ref >60)
Glucose, Bld: 155 mg/dL — ABNORMAL HIGH (ref 70–99)
Potassium: 3.8 mmol/L (ref 3.5–5.1)
Sodium: 144 mmol/L (ref 135–145)

## 2021-12-15 LAB — CBC
HCT: 42.7 % (ref 36.0–46.0)
Hemoglobin: 11.5 g/dL — ABNORMAL LOW (ref 12.0–15.0)
MCH: 30.4 pg (ref 26.0–34.0)
MCHC: 26.9 g/dL — ABNORMAL LOW (ref 30.0–36.0)
MCV: 113 fL — ABNORMAL HIGH (ref 80.0–100.0)
Platelets: 80 10*3/uL — ABNORMAL LOW (ref 150–400)
RBC: 3.78 MIL/uL — ABNORMAL LOW (ref 3.87–5.11)
RDW: 13.7 % (ref 11.5–15.5)
WBC: 5.6 10*3/uL (ref 4.0–10.5)
nRBC: 0 % (ref 0.0–0.2)

## 2021-12-15 LAB — TSH: TSH: 18.463 u[IU]/mL — ABNORMAL HIGH (ref 0.350–4.500)

## 2021-12-15 LAB — URINALYSIS, COMPLETE (UACMP) WITH MICROSCOPIC
Bacteria, UA: NONE SEEN
Bilirubin Urine: NEGATIVE
Glucose, UA: NEGATIVE mg/dL
Hgb urine dipstick: NEGATIVE
Ketones, ur: NEGATIVE mg/dL
Leukocytes,Ua: NEGATIVE
Nitrite: NEGATIVE
Protein, ur: 30 mg/dL — AB
Specific Gravity, Urine: 1.02 (ref 1.005–1.030)
Squamous Epithelial / HPF: NONE SEEN (ref 0–5)
pH: 6 (ref 5.0–8.0)

## 2021-12-15 LAB — MAGNESIUM: Magnesium: 1.9 mg/dL (ref 1.7–2.4)

## 2021-12-15 LAB — LACTIC ACID, PLASMA
Lactic Acid, Venous: 3 mmol/L (ref 0.5–1.9)
Lactic Acid, Venous: 1.9 mmol/L (ref 0.5–1.9)

## 2021-12-15 LAB — MRSA NEXT GEN BY PCR, NASAL: MRSA by PCR Next Gen: NOT DETECTED

## 2021-12-15 LAB — GLUCOSE, CAPILLARY: Glucose-Capillary: 137 mg/dL — ABNORMAL HIGH (ref 70–99)

## 2021-12-15 LAB — PHOSPHORUS: Phosphorus: 2.9 mg/dL (ref 2.5–4.6)

## 2021-12-15 LAB — STREP PNEUMONIAE URINARY ANTIGEN: Strep Pneumo Urinary Antigen: NEGATIVE

## 2021-12-15 MED ORDER — IPRATROPIUM-ALBUTEROL 0.5-2.5 (3) MG/3ML IN SOLN: 3.0000 mL | RESPIRATORY_TRACT | Status: DC | PRN

## 2021-12-15 MED ORDER — ARFORMOTEROL TARTRATE 15 MCG/2ML IN NEBU
15.0000 ug | INHALATION_SOLUTION | Freq: Two times a day (BID) | RESPIRATORY_TRACT | Status: DC
  Filled 2021-12-15 (×3): qty 2

## 2021-12-15 MED ORDER — DEXMEDETOMIDINE HCL IN NACL 400 MCG/100ML IV SOLN
0.4000 ug/kg/h | INTRAVENOUS | Status: DC
  Administered 2021-12-15: 0.8 ug/kg/h via INTRAVENOUS
  Administered 2021-12-15: 0.4 ug/kg/h via INTRAVENOUS
  Filled 2021-12-15 (×2): qty 100

## 2021-12-15 MED ORDER — CHLORHEXIDINE GLUCONATE CLOTH 2 % EX PADS
6.0000 | MEDICATED_PAD | Freq: Every day | CUTANEOUS | Status: DC
  Administered 2021-12-15: 6 via TOPICAL
  Filled 2021-12-15: qty 6

## 2021-12-15 MED ORDER — ARFORMOTEROL TARTRATE 15 MCG/2ML IN NEBU: 15.0000 ug | INHALATION_SOLUTION | Freq: Two times a day (BID) | RESPIRATORY_TRACT | Status: DC

## 2021-12-15 MED ORDER — DOCUSATE SODIUM 100 MG PO CAPS: 100.0000 mg | ORAL_CAPSULE | Freq: Two times a day (BID) | ORAL | Status: DC | PRN

## 2021-12-15 MED ORDER — POLYETHYLENE GLYCOL 3350 17 G PO PACK: 17.0000 g | PACK | Freq: Every day | ORAL | Status: DC | PRN

## 2021-12-15 MED ORDER — METHYLPREDNISOLONE SODIUM SUCC 40 MG IJ SOLR
40.0000 mg | Freq: Two times a day (BID) | INTRAMUSCULAR | Status: DC
  Administered 2021-12-15 (×2): 40 mg via INTRAVENOUS
  Filled 2021-12-15 (×2): qty 1

## 2021-12-15 MED ORDER — BUDESONIDE 0.5 MG/2ML IN SUSP: 0.5000 mg | Freq: Two times a day (BID) | RESPIRATORY_TRACT | Status: DC

## 2021-12-15 MED ORDER — ZIPRASIDONE MESYLATE 20 MG IM SOLR
10.0000 mg | Freq: Once | INTRAMUSCULAR | Status: AC
  Administered 2021-12-15: 10 mg via INTRAMUSCULAR
  Filled 2021-12-15: qty 20

## 2021-12-15 MED ORDER — QUETIAPINE FUMARATE 25 MG PO TABS
25.0000 mg | ORAL_TABLET | Freq: Two times a day (BID) | ORAL | Status: DC
  Administered 2021-12-15 – 2021-12-18 (×6): 25 mg via ORAL
  Filled 2021-12-15 (×6): qty 1

## 2021-12-15 MED ORDER — FAMOTIDINE IN NACL 20-0.9 MG/50ML-% IV SOLN
20.0000 mg | INTRAVENOUS | Status: DC
  Administered 2021-12-15: 20 mg via INTRAVENOUS
  Filled 2021-12-15: qty 50

## 2021-12-15 MED ORDER — BUDESONIDE 0.5 MG/2ML IN SUSP
0.5000 mg | Freq: Two times a day (BID) | RESPIRATORY_TRACT | Status: DC
  Administered 2021-12-15 (×2): 0.5 mg via RESPIRATORY_TRACT
  Filled 2021-12-15 (×3): qty 2

## 2021-12-15 MED ORDER — ENOXAPARIN SODIUM 60 MG/0.6ML IJ SOSY
0.5000 mg/kg | PREFILLED_SYRINGE | INTRAMUSCULAR | Status: DC
  Administered 2021-12-15: 50 mg via SUBCUTANEOUS
  Filled 2021-12-15: qty 0.6

## 2021-12-15 MED ORDER — SODIUM CHLORIDE 0.9 % IV SOLN
2.0000 g | INTRAVENOUS | Status: DC
  Administered 2021-12-15: 2 g via INTRAVENOUS
  Filled 2021-12-15: qty 2
  Filled 2021-12-15: qty 20

## 2021-12-15 MED ORDER — RIVAROXABAN 20 MG PO TABS
20.0000 mg | ORAL_TABLET | Freq: Every day | ORAL | Status: DC
  Administered 2021-12-16 – 2021-12-18 (×3): 20 mg via ORAL
  Filled 2021-12-15 (×5): qty 1

## 2021-12-15 MED ORDER — LACTATED RINGERS IV BOLUS
1000.0000 mL | Freq: Once | INTRAVENOUS | Status: AC
  Administered 2021-12-15: 1000 mL via INTRAVENOUS

## 2021-12-15 MED ORDER — REVEFENACIN 175 MCG/3ML IN SOLN
175.0000 ug | Freq: Every day | RESPIRATORY_TRACT | Status: DC
  Administered 2021-12-16 – 2021-12-18 (×3): 175 ug via RESPIRATORY_TRACT
  Filled 2021-12-15 (×4): qty 3

## 2021-12-15 MED ORDER — LACTATED RINGERS IV SOLN: INTRAVENOUS | Status: DC

## 2021-12-15 MED ORDER — LEVOTHYROXINE SODIUM 100 MCG/5ML IV SOLN: 100.0000 ug | Freq: Every day | INTRAVENOUS | Status: DC

## 2021-12-15 MED ORDER — LACTATED RINGERS IV BOLUS
500.0000 mL | Freq: Once | INTRAVENOUS | Status: AC
  Administered 2021-12-15: 500 mL via INTRAVENOUS

## 2021-12-15 MED ORDER — ZIPRASIDONE MESYLATE 20 MG IM SOLR
10.0000 mg | Freq: Two times a day (BID) | INTRAMUSCULAR | Status: DC | PRN
  Administered 2021-12-16: 10 mg via INTRAMUSCULAR
  Filled 2021-12-15: qty 20

## 2021-12-15 MED ORDER — SODIUM CHLORIDE 0.9 % IV SOLN
500.0000 mg | INTRAVENOUS | Status: DC
  Administered 2021-12-15 – 2021-12-16 (×2): 500 mg via INTRAVENOUS
  Filled 2021-12-15 (×2): qty 5

## 2021-12-15 NOTE — Progress Notes (Signed)
Pharmacy Antibiotic Note  Terri Casey is a 61 y.o. female admitted on 12/14/2021 with CAP.  Pharmacy has been consulted for Azithromycin & Ceftriaxone dosing.  Plan: Ceftriaxone 2 gm and Azithromycin 500 mg q24h for 5 days per indication & renal fxn.  Pharmacy will continue to follow and will adjust abx dosing whenever warranted.  Temp (24hrs), Avg:98 F (36.7 C), Min:98 F (36.7 C), Max:98 F (36.7 C)   Recent Labs  Lab 12/14/21 1959  WBC 5.0  CREATININE 0.66  LATICACIDVEN 1.1    CrCl cannot be calculated (Unknown ideal weight.).    Allergies  Allergen Reactions   Estrogens Hives   Mustard Seed Hives   Strawberry Extract Hives    Antimicrobials this admission: 12/14 Cefepime >> x 1 dose 12/14 Vancomycin >> x 1 dose 12/15 Ceftriaxone >> x 5 days 12/15 Azithromycin >> x 5 days  Microbiology results: 12/14 BCx: Pending  Thank you for allowing pharmacy to be a part of this patient's care.  Renda Rolls, PharmD, [facility] 12/15/2021 12:36 AM

## 2021-12-15 NOTE — Progress Notes (Signed)
Pulmonary and Critical care    NAME:  Terri Casey, MRN:  [id number], DOB:  [birth year], LOS: 0 ADMISSION DATE:  12/14/2021,     History of Present Illness:   The patient is a 61 year old lady continues to smoke cigarettes, came to emergency room December 14 and respiratory distress.  The patient has access to home oxygen.  The patient was not able to provide history in the ER.  Vital signs in the ER pulse rate 53 respiration 18 ABG showed pH of 7.21 pCO2 120.  The patient was started on BiPAP and admitted to the intensive care unit.  She has history of essential hypertension diabetes and chronic diastolic heart failure also history of proximal atrial fibrillation.    December 15.  Patient has improvement on blood gas on the BiPAP.  pH 7.35 pCO2 77.  Also the patient had elevated lactic acid of 3.0 in the ER which reduced to 1.9.  TSH elevated to 18.4.  The patient appeared confused while in the ICU and subsequently was agitated and required Geodon.  She denied to pain denied any nausea vomiting.          12/15/2021   10:50 AM 12/15/2021    9:00 AM 12/15/2021    8:30 AM  Vitals with BMI  Systolic  97 96  Diastolic  59 66  Pulse 53 59 56    FiO2 (%):  [30 %-45 %] 30 %   Examination: General: Awake comfortable confused HENT: No cyanosis no icterus Lungs: Air entry was heard bilaterally diminished resonant to percussion anteriorly Cardiovascular: Heart S1-S2 no murmurs Abdomen: Abdomen soft nontender obese, obesity limits examination Extremities: Extremity trace edema no cyanosis. Neuro: Was moving all extremities to commands off BiPAP this morning Not done   Assessment & Plan:  Acute on chronic hypoxic and hypercapnic respiratory failure COPD exacerbation Metabolic encephalopathy Hypothyroidism Chronic diastolic heart failure and history of paroxysmal atrial fibrillation. Bradycardia.  Plan 1.  Discontinue Precedex Geodon as needed EKG  Ceftriaxone and  azithromycin for COPD exacerbation BiPAP for 50% during daytime today and scheduled at night Free T4 Currently on IV Synthroid Seroquel 25 mg twice daily Scheduled bronchodilators nebulized, inhaled steroids nebulized.  Long-acting LAMA nebulized   Best Practice (right click and "Reselect all SmartList Selections" daily)   Diet/type: dysphagia diet (see orders) DVT prophylaxis: LMWH GI prophylaxis: H2B Lines: N/A Foley:  N/A Code Status:  full code  Labs   CBC: Recent Labs  Lab 12/14/21 1959 12/15/21 0115  WBC 5.0 5.6  NEUTROABS 3.0  --   HGB 11.7* 11.5*  HCT 43.8 42.7  MCV 113.8* 113.0*  PLT 89* 80*    Basic Metabolic Panel: Recent Labs  Lab 12/14/21 1959 12/15/21 0115  NA 145 144  K 4.1 3.8  CL 99 100  CO2 40* 38*  GLUCOSE 115* 155*  BUN 13 16  CREATININE 0.66 0.70  CALCIUM 8.8* 8.9  MG  --  1.9  PHOS  --  2.9   GFR: Estimated Creatinine Clearance: 90.1 mL/min (by C-G formula based on SCr of 0.7 mg/dL). Recent Labs  Lab 12/14/21 1959 12/15/21 0115 12/15/21 0150 12/15/21 0729  WBC 5.0 5.6  --   --   LATICACIDVEN 1.1  --  3.0* 1.9    Liver Function Tests: Recent Labs  Lab 12/14/21 1959  AST 14*  ALT 9  ALKPHOS 94  BILITOT 0.5  PROT 6.5  ALBUMIN 3.5   No results for input(s): "LIPASE", "AMYLASE" in the  last 168 hours. No results for input(s): "AMMONIA" in the last 168 hours.  ABG    Component Value Date/Time   PHART 7.35 12/15/2021 0431   PCO2ART 77 (HH) 12/15/2021 0431   PO2ART 91 12/15/2021 0431   HCO3 42.5 (H) 12/15/2021 0431   TCO2 34 (H) 09/12/2021 1634   ACIDBASEDEF 7.7 (H) 08/09/2019 0748   O2SAT 98.7 12/15/2021 0431     Coagulation Profile: No results for input(s): "INR", "PROTIME" in the last 168 hours.  Cardiac Enzymes: No results for input(s): "CKTOTAL", "CKMB", "CKMBINDEX", "TROPONINI" in the last 168 hours.  HbA1C: Hgb A1c MFr Bld  Date/Time Value Ref Range Status  09/11/2021 05:38 PM 4.9 4.8 - 5.6 % Final     Comment:    (NOTE) Pre diabetes:          5.7%-6.4%  Diabetes:              >6.4%  Glycemic control for   <7.0% adults with diabetes   09/11/2021 04:46 AM 4.9 4.8 - 5.6 % Final    Comment:    (NOTE) Pre diabetes:          5.7%-6.4%  Diabetes:              >6.4%  Glycemic control for   <7.0% adults with diabetes     CBG: Recent Labs  Lab 12/15/21 0126  GLUCAP 137*    RADIOLOGY FINDINGS: Stable cardiomegaly with prominence of the central pulmonary vasculature. Streaky bibasilar opacities. Possible trace left pleural effusion. No pneumothorax.   IMPRESSION: Streaky bibasilar opacities, atelectasis versus pneumonia. Possible trace left pleural effusion.      Review of Systems:   Denied to pain nausea vomiting or focal logic weakness reported no fever however patient was confused history not reliable.  Past Medical History:  She,  has a past medical history of Anxiety, Asthma, CHF (congestive heart failure) ([place]), COPD (chronic obstructive pulmonary disease) ([place]), Diabetes mellitus without complication ([place]), Hypertension, and Thyroid disease.   Surgical History:   Past Surgical History:  Procedure Laterality Date   ABDOMINAL HYSTERECTOMY     THORACENTESIS Left 09/18/2021   Procedure: THORACENTESIS;  Surgeon: Juanito Doom, MD;  Location: [place];  Service: Cardiopulmonary;  Laterality: Left;     Social History:   reports that she has been smoking cigarettes. She has a 10.00 pack-year smoking history. She has never used smokeless tobacco. She reports that she does not drink alcohol and does not use drugs.   Family History:  Her family history is not on file.   Allergies Allergies  Allergen Reactions   Estrogens Hives   Mustard Seed Hives   Strawberry Extract Hives     Home Medications  Prior to Admission medications   Medication Sig Start Date End Date Taking? Authorizing Provider  ADVAIR DISKUS 500-50 MCG/ACT AEPB Inhale 1 puff into the  lungs in the morning and at bedtime. 12/04/21  Yes [provider]  amLODipine (NORVASC) 10 MG tablet Take 10 mg by mouth daily. 02/28/21  Yes [provider]  atorvastatin (LIPITOR) 20 MG tablet Take 20 mg by mouth at bedtime.    Yes [provider]  bethanechol (URECHOLINE) 5 MG tablet Take 5 mg by mouth 2 (two) times daily. 08/06/19  Yes [provider]  escitalopram (LEXAPRO) 20 MG tablet Take 20 mg by mouth daily. 10/25/21  Yes [provider]  folic acid (FOLVITE) 1 MG tablet Take 1 tablet (1 mg total) by mouth daily.  10/11/19  Yes Tat, Shanon Brow, MD  furosemide (LASIX) 40 MG tablet Take 1 tablet (40 mg total) by mouth daily. 10/11/19  Yes Tat, Shanon Brow, MD  hydrocortisone cream 1 % Apply topically 2 (two) times daily. 09/25/21  Yes Pokhrel, Corrie Mckusick, MD  levothyroxine (SYNTHROID) 200 MCG tablet Take 1 tablet (200 mcg total) by mouth daily at 6 (six) AM. 06/20/19  Yes Swayze, Ava, DO  metoprolol tartrate (LOPRESSOR) 25 MG tablet Take 0.5 tablets (12.5 mg total) by mouth 2 (two) times daily. 10/10/19  Yes Tat, Shanon Brow, MD  oxyCODONE-acetaminophen (PERCOCET) 10-325 MG tablet Take 1 tablet by mouth 4 (four) times daily. 09/06/21  Yes [provider]  pantoprazole (PROTONIX) 40 MG tablet Take 1 tablet (40 mg total) by mouth daily. 06/20/19  Yes Swayze, Ava, DO  STIOLTO RESPIMAT 2.5-2.5 MCG/ACT AERS Inhale 2 each into the lungs daily. 12/04/21  Yes [provider]  topiramate (TOPAMAX) 100 MG tablet Take 100 mg by mouth 2 (two) times daily. 06/01/20  Yes [provider]  Vitamin D, Ergocalciferol, (DRISDOL) 1.25 MG (50000 UNIT) CAPS capsule Take 50,000 Units by mouth once a week. 10/02/21  Yes [provider]  XARELTO 20 MG TABS tablet Take 20 mg by mouth daily. 05/08/19  Yes [provider]  albuterol (PROAIR HFA) 108 (90 Base) MCG/ACT inhaler 2 puffs every 4 hours as needed only  if your can't catch your breath 03/21/21   Wynetta Emery,  Clanford L, MD  albuterol (PROVENTIL) (2.5 MG/3ML) 0.083% nebulizer solution Take 3 mLs (2.5 mg total) by nebulization every 6 (six) hours as needed for wheezing or shortness of breath. 03/21/21   Johnson, Clanford L, MD  cyclobenzaprine (FLEXERIL) 5 MG tablet Take 5 mg by mouth 3 (three) times daily. Patient not taking: Reported on 12/14/2021 02/23/19   [provider]  fluticasone (FLOVENT HFA) 110 MCG/ACT inhaler Inhale 2 puffs into the lungs 2 (two) times daily. Patient not taking: Reported on 12/14/2021 09/25/21 09/25/22  Flora Lipps, MD  liothyronine (CYTOMEL) 25 MCG tablet Take 25 mcg by mouth daily. Patient not taking: Reported on 12/14/2021 07/31/21   [provider]  loratadine (CLARITIN) 10 MG tablet Take 10 mg by mouth daily. Patient not taking: Reported on 12/14/2021    [provider]  SUMAtriptan (IMITREX) 50 MG tablet Take 50 mg by mouth every 2 (two) hours as needed for migraine. 07/31/21   [provider]  umeclidinium bromide (INCRUSE ELLIPTA) 62.5 MCG/ACT AEPB Inhale 1 puff into the lungs daily. Patient not taking: Reported on 12/14/2021 09/25/21   Flora Lipps, MD     Critical care time:  I have personally spent _____45_____ minutes of critical care time, exclusive of time spent on any  procedures, in evaluation and management of this critically ill patient's condition.    Anslee Micheletti MD ([place]) FOR [place] [facility]

## 2021-12-15 NOTE — Progress Notes (Signed)
Patient ID: Terri Casey, female   DOB: [birth year], 61 y.o.   MRN: [id number]  61 y/o F smoker, p/w resp failure 12/14, she is on home O2, initial ABG: pH of 7.21 pCO2 120.  The patient was started on BiPAP and admitted to the intensive care unit.     E-link called due to confusion over plan for BiPAP, trial off BiPAP.   Plans:  -Acute on chronic hypoxic and hypercapnic respiratory failure d/t COPD exacerbation  -Discontinue Precedex, use Geodon as needed  -seen on E-Link cam, looks stable comfortable, getting good volumes, keep on BiPAP for 50% during daytime and trial off while being monitored, keep on till 6am today then d/c so that AM team can assess after a period off BiPAP.

## 2021-12-15 NOTE — Consult Note (Addendum)
PHARMACY CONSULT NOTE - FOLLOW UP  Pharmacy Consult for Electrolyte Monitoring and Replacement   Recent Labs: Potassium (mmol/L)  Date Value  12/15/2021 3.8   Magnesium (mg/dL)  Date Value  12/15/2021 1.9   Calcium (mg/dL)  Date Value  12/15/2021 8.9   Albumin (g/dL)  Date Value  12/14/2021 3.5   Phosphorus (mg/dL)  Date Value  12/15/2021 2.9   Sodium (mmol/L)  Date Value  12/15/2021 144     Assessment: 61 year old lady continues to smoke cigarettes, came to emergency room 12/14 with respiratory distress. The patient has access to home oxygen. In ED, was started on BiPAP and admitted to the intensive care unit. She has history of essential hypertension diabetes and chronic diastolic heart failure also history of proximal atrial fibrillation. Pharmacy has been consulted to monitor and replace electrolytes while under PCCM care.  IVF: LR'@75ml'$ /hr  Goal of Therapy:  Electroyltes WNL  Plan:  - No replacement currently indicated - Will recheck electrolytes with AM labs  Pearla Dubonnet ,PharmD Clinical Pharmacist 12/15/2021 2:19 PM

## 2021-12-15 NOTE — Progress Notes (Signed)
ABG obtained and resulted. Results called in to [facility] Physician.

## 2021-12-15 NOTE — Plan of Care (Signed)
Continuing with plan of care. 

## 2021-12-15 NOTE — Progress Notes (Signed)
Reached out to Dr.Mannam to ask if patient could take break from bipap. Dr. Vaughan Browner said to trial her off it for a couple hours. RT [facility] notified and Patient placed on 6L.

## 2021-12-15 NOTE — Progress Notes (Signed)
Patient has had an eventful day, patient initially started out the day restless and later was resting comfortably in bed.  Patient was placed on bipap at 1050 and continues on bipap at this time.  Patient was able to transition off of precidex this shift and tolerated well. At 1600 patient was bladder scanned by myself and tech and noted to have 66m in bladder. JDarlyn Chamber NP notified of no urine output and bladder scan of 040mand ordered a 5002molus of LR to administer to the patient.  Patient currently has yet to void, will continue to monitor and endorse to oncoming shift.

## 2021-12-15 NOTE — Progress Notes (Signed)
Patient has order for EKG, patient continues to move around in bed and refusing at this time.  Dr. Dillard Cannon aware and ok for EKG to be obtained after a prn dose of geodone is given.  Will endorse to oncoming shift.

## 2021-12-15 NOTE — ED Provider Notes (Incomplete)
[facility] Inpatient Provider Note    Event Date/Time   First MD Initiated Contact with Patient 12/14/21 1937     (approximate)   History   Altered Mental Status   HPI  Terri Casey is a 61 y.o. female who presents to the emergency department today brought in by EMS because of concerns for altered mental status.  Patient herself is unable to give any history.  Per report family noticed patient was more confused today.  Per chart review patient has had admissions for respiratory difficulty and sepsis in the past.     Physical Exam   Triage Vital Signs: ED Triage Vitals [12/14/21 1940]  Enc Vitals Group     BP      Pulse Rate (!) 53     Resp 18     Temp      Temp src      SpO2 93 %     Weight      Height      Head Circumference      Peak Flow      Pain Score 0     Pain Loc      Pain Edu?      Excl. in [place]?     Most recent vital signs: Vitals:   12/14/21 1940  Pulse: (!) 53  Resp: 18  SpO2: 93%   General: Awake, alert, disoriented. CV:  Good peripheral perfusion. Bradycardia. Resp:  Normal effort. Lungs clear. Abd:  No distention. Non tender.    ED Results / Procedures / Treatments   Labs (all labs ordered are listed, but only abnormal results are displayed) Labs Reviewed  COMPREHENSIVE METABOLIC PANEL - Abnormal; Notable for the following components:      Result Value   CO2 40 (*)    Glucose, Bld 115 (*)    Calcium 8.8 (*)    AST 14 (*)    All other components within normal limits  CBC WITH DIFFERENTIAL/PLATELET - Abnormal; Notable for the following components:   RBC 3.85 (*)    Hemoglobin 11.7 (*)    MCV 113.8 (*)    MCHC 26.7 (*)    Platelets 89 (*)    All other components within normal limits  BLOOD GAS, ARTERIAL - Abnormal; Notable for the following components:   pH, Arterial 7.21 (*)    pCO2 arterial 120 (*)    pO2, Arterial <31 (*)    Bicarbonate 48.0 (*)    Acid-Base Excess 15.1 (*)    All other components within  normal limits  BLOOD GAS, VENOUS - Abnormal; Notable for the following components:   pH, Ven 7.22 (*)    pCO2, Ven 115 (*)    pO2, Ven <31 (*)    Bicarbonate 47.1 (*)    Acid-Base Excess 14.8 (*)    All other components within normal limits  RESP PANEL BY RT-PCR (RSV, FLU A&B, COVID)  RVPGX2  CULTURE, BLOOD (ROUTINE X 2)  CULTURE, BLOOD (ROUTINE X 2)  LACTIC ACID, PLASMA  LACTIC ACID, PLASMA  URINALYSIS, COMPLETE (UACMP) WITH MICROSCOPIC  BLOOD GAS, VENOUS     EKG  I, Nance Pear, attending physician, personally viewed and interpreted this EKG  EKG Time: 1943 Rate: 58 Rhythm: sinus bradycardia Axis: normal Intervals: qtc 440 QRS: narrow ST changes: no st elevation Impression: abnormal ekg   RADIOLOGY I independently interpreted and visualized the CXR. My interpretation: *** Radiology interpretation:  IMPRESSION:  Streaky bibasilar opacities, atelectasis versus pneumonia. Possible  trace left pleural effusion.    I independently interpreted and visualized the CT head. My interpretation: *** Radiology interpretation:  IMPRESSION:  No acute intracranial abnormalities.      PROCEDURES:  Critical Care performed: Yes, see critical care procedure note(s)  Procedures  CRITICAL CARE Performed by: Nance Pear   Total critical care time: *** minutes  Critical care time was exclusive of separately billable procedures and treating other patients.  Critical care was necessary to treat or prevent imminent or life-threatening deterioration.  Critical care was time spent personally by me on the following activities: development of treatment plan with patient and/or surrogate as well as nursing, discussions with consultants, evaluation of patient's response to treatment, examination of patient, obtaining history from patient or surrogate, ordering and performing treatments and interventions, ordering and review of laboratory studies, ordering and review of  radiographic studies, pulse oximetry and re-evaluation of patient's condition.   MEDICATIONS ORDERED IN ED: Medications - No data to display   IMPRESSION / MDM / [place] / ED COURSE  I reviewed the triage vital signs and the nursing notes.                              Differential diagnosis includes, but is not limited to, ***  Patient's presentation is most consistent with {EM COPA:27473}  *** {If the patient is on the monitor, remove the brackets and asterisks on the sentence below and remember to document it as a Procedure as well. Otherwise delete the sentence below:1} {**The patient is on the cardiac monitor to evaluate for evidence of arrhythmia and/or significant heart rate changes.**} {Remember to include, when applicable, any/all of the following data: independent review of imaging independent review of labs (comment specifically on pertinent positives and negatives) review of specific prior hospitalizations, PCP/specialist notes, etc. discuss meds given and prescribed document any discussion with consultants (including hospitalists) any clinical decision tools you used and why (PECARN, NEXUS, etc.) did you consider admitting the patient? document social determinants of health affecting patient's care (homelessness, inability to follow up in a timely fashion, etc) document any pre-existing conditions increasing risk on current visit (e.g. diabetes and HTN increasing danger of high-risk chest pain/ACS) describes what meds you gave (especially parenteral) and why any other interventions?:1}     FINAL CLINICAL IMPRESSION(S) / ED DIAGNOSES   Final diagnoses:  None     Rx / DC Orders   ED Discharge Orders     None        Note:  This document was prepared using Dragon voice recognition software and may include unintentional dictation errors.

## 2021-12-15 NOTE — Progress Notes (Signed)
[place] Progress Note Patient Name: ZOOEY SCHREURS DOB: 1960/05/08 MRN: [id number]   Date of Service  12/15/2021  HPI/Events of Note  ABG reviewed on current BIPAP settings, patient appears to have urinary retention.  eICU Interventions  Adjustments made to BIPAP settings, ABG in one hour. In / Out bladder catheterization ordered.        Kerry Kass Chauncey Sciulli 12/15/2021, 2:08 AM

## 2021-12-16 DIAGNOSIS — J441 Chronic obstructive pulmonary disease with (acute) exacerbation: Secondary | ICD-10-CM | POA: Diagnosis not present

## 2021-12-16 LAB — BLOOD GAS, ARTERIAL
Acid-Base Excess: 12.6 mmol/L — ABNORMAL HIGH (ref 0.0–2.0)
Bicarbonate: 40.6 mmol/L — ABNORMAL HIGH (ref 20.0–28.0)
O2 Content: 3 L/min
O2 Saturation: 93.6 %
Patient temperature: 37
pCO2 arterial: 67 mmHg (ref 32–48)
pH, Arterial: 7.39 (ref 7.35–7.45)
pO2, Arterial: 62 mmHg — ABNORMAL LOW (ref 83–108)

## 2021-12-16 LAB — T4, FREE: Free T4: 0.62 ng/dL (ref 0.61–1.12)

## 2021-12-16 LAB — PROCALCITONIN: Procalcitonin: <0.1 ng/mL

## 2021-12-16 MED ORDER — TOPIRAMATE 100 MG PO TABS
100.0000 mg | ORAL_TABLET | Freq: Two times a day (BID) | ORAL | Status: DC
  Administered 2021-12-16 – 2021-12-18 (×5): 100 mg via ORAL
  Filled 2021-12-16 (×5): qty 1

## 2021-12-16 MED ORDER — PANTOPRAZOLE SODIUM 40 MG PO TBEC
40.0000 mg | DELAYED_RELEASE_TABLET | Freq: Every day | ORAL | Status: DC
  Administered 2021-12-16 – 2021-12-18 (×3): 40 mg via ORAL
  Filled 2021-12-16 (×3): qty 1

## 2021-12-16 MED ORDER — METOPROLOL TARTRATE 25 MG PO TABS
12.5000 mg | ORAL_TABLET | Freq: Two times a day (BID) | ORAL | Status: DC
  Administered 2021-12-16 – 2021-12-17 (×4): 12.5 mg via ORAL
  Filled 2021-12-16 (×5): qty 1

## 2021-12-16 MED ORDER — ESCITALOPRAM OXALATE 10 MG PO TABS
20.0000 mg | ORAL_TABLET | Freq: Every day | ORAL | Status: DC
  Administered 2021-12-16 – 2021-12-18 (×3): 20 mg via ORAL
  Filled 2021-12-16: qty 2
  Filled 2021-12-16: qty 1
  Filled 2021-12-16: qty 2

## 2021-12-16 MED ORDER — MOMETASONE FURO-FORMOTEROL FUM 200-5 MCG/ACT IN AERO
2.0000 | INHALATION_SPRAY | Freq: Two times a day (BID) | RESPIRATORY_TRACT | Status: DC
  Administered 2021-12-16 – 2021-12-18 (×4): 2 via RESPIRATORY_TRACT
  Filled 2021-12-16 (×2): qty 8.8

## 2021-12-16 MED ORDER — LEVOTHYROXINE SODIUM 100 MCG PO TABS: 100.0000 ug | ORAL_TABLET | Freq: Every day | ORAL | Status: DC

## 2021-12-16 MED ORDER — LEVOTHYROXINE SODIUM 100 MCG PO TABS
200.0000 ug | ORAL_TABLET | Freq: Every day | ORAL | Status: DC
  Administered 2021-12-17 – 2021-12-18 (×2): 200 ug via ORAL
  Filled 2021-12-16 (×2): qty 2

## 2021-12-16 MED ORDER — METHYLPREDNISOLONE SODIUM SUCC 40 MG IJ SOLR
40.0000 mg | Freq: Every day | INTRAMUSCULAR | Status: DC
  Administered 2021-12-17: 40 mg via INTRAVENOUS
  Filled 2021-12-16: qty 1

## 2021-12-16 MED ORDER — FOLIC ACID 1 MG PO TABS
1.0000 mg | ORAL_TABLET | Freq: Every day | ORAL | Status: DC
  Administered 2021-12-16 – 2021-12-18 (×3): 1 mg via ORAL
  Filled 2021-12-16 (×3): qty 1

## 2021-12-16 MED ORDER — STERILE WATER FOR INJECTION IJ SOLN
INTRAMUSCULAR | Status: AC
  Filled 2021-12-16: qty 10

## 2021-12-16 MED ORDER — ATORVASTATIN CALCIUM 20 MG PO TABS
20.0000 mg | ORAL_TABLET | Freq: Every day | ORAL | Status: DC
  Administered 2021-12-16 – 2021-12-17 (×2): 20 mg via ORAL
  Filled 2021-12-16 (×2): qty 1

## 2021-12-16 MED ORDER — FUROSEMIDE 40 MG PO TABS
40.0000 mg | ORAL_TABLET | Freq: Every day | ORAL | Status: DC
  Administered 2021-12-16 – 2021-12-18 (×3): 40 mg via ORAL
  Filled 2021-12-16: qty 1
  Filled 2021-12-16: qty 2
  Filled 2021-12-16: qty 1

## 2021-12-16 NOTE — Progress Notes (Signed)
[place] at [place] NAME: Terri Casey    MR#:  [id number]  DATE OF BIRTH:  09/14/1960  SUBJECTIVE:  patient transferred from [facility] CM to [facility] service for pickup. Came in with altered mental status. Noncompliance to meds. Found to be hypoxic and HyperCarbia  Remained  on BiPAP. Required some Geodon for agitation. No family during my evaluation. Patient has mittens on. Per RN she did eat her breakfast. Will transfer to progressive care unit   VITALS:  Blood pressure (!) 140/65, pulse 79, temperature 98.2 F (36.8 C), temperature source Oral, resp. rate 17, height '5\' 9"'$  (1.753 m), weight 99 kg, SpO2 98 %.  PHYSICAL EXAMINATION:   GENERAL:  61 y.o.-year-old patient lying in the bed with no acute distress. disheveled LUNGS distant breath sounds bilaterally, no wheezing CARDIOVASCULAR: S1, S2 normal. No murmur   ABDOMEN: Soft, nontender, nondistended. Bowel sounds present.  EXTREMITIES: No  edema b/l.    NEUROLOGIC: nonfocal  patient is alert, ?some baseline cognitive decline   LABORATORY PANEL:  CBC Recent Labs  Lab 12/15/21 0115  WBC 5.6  HGB 11.5*  HCT 42.7  PLT 80*    Chemistries  Recent Labs  Lab 12/14/21 1959 12/15/21 0115  NA 145 144  K 4.1 3.8  CL 99 100  CO2 40* 38*  GLUCOSE 115* 155*  BUN 13 16  CREATININE 0.66 0.70  CALCIUM 8.8* 8.9  MG  --  1.9  AST 14*  --   ALT 9  --   ALKPHOS 94  --   BILITOT 0.5  --    Cardiac Enzymes No results for input(s): "TROPONINI" in the last 168 hours. RADIOLOGY:  CT Head Wo Contrast  Result Date: 12/14/2021 CLINICAL DATA:  Altered mental status. Respiratory distress. Confusion. EXAM: CT HEAD WITHOUT CONTRAST TECHNIQUE: Contiguous axial images were obtained from the base of the skull through the vertex without intravenous contrast. RADIATION DOSE REDUCTION: This exam was performed according to the departmental dose-optimization program which includes automated exposure  control, adjustment of the mA and/or kV according to patient size and/or use of iterative reconstruction technique. COMPARISON:  09/11/2021 FINDINGS: Brain: No evidence of acute infarction, hemorrhage, hydrocephalus, extra-axial collection or mass lesion/mass effect. Vascular: No hyperdense vessel or unexpected calcification. Skull: Normal. Negative for fracture or focal lesion. Sinuses/Orbits: No acute finding. Other: None. IMPRESSION: No acute intracranial abnormalities. Electronically Signed   By: Lucienne Capers M.D.   On: 12/14/2021 20:31   DG Chest Port 1 View  Result Date: 12/14/2021 CLINICAL DATA:  Sepsis EXAM: PORTABLE CHEST 1 VIEW COMPARISON:  09/23/2021, 09/17/2021 FINDINGS: Stable cardiomegaly with prominence of the central pulmonary vasculature. Streaky bibasilar opacities. Possible trace left pleural effusion. No pneumothorax. IMPRESSION: Streaky bibasilar opacities, atelectasis versus pneumonia. Possible trace left pleural effusion. Electronically Signed   By: Davina Poke D.O.   On: 12/14/2021 20:13    Assessment and Plan  61 year old with history of asthma, COPD, chronic respiratory failure on home oxygen, diabetes, hypertension, diastolic heart failure, paroxysmal atrial fibrillation, OSA presenting today with respiratory distress, hypoxic, hypercapnic respiratory failure was placed on BiPAP.  Acute on chronic hypoxic hyper cardiac respiratory failure secondary to COPD exacerbations/ongoing tobacco abuse -- patient remained on BiPAP-- now changed to QHS and during that time. -- ABG will shows improvement in PCO2 -- continue IV Solu-Medrol and bronchodilators -patient-was on empiric antibiotic. Per calcitonin negative, white count normal, chest x-ray no lobar consolidation. -- Discontinue antibiotic and continue to  monitor --assess for NIV  History of chronic diastolic heart failure -- resume Lasix  Paroxysmal a fib -- resume metoprolol and Xarelto  Hypothyroidism --  continue Synthroid  Depression/anxiety -- continue Lexapro -- patient has been having some issues with agitate patient and did receive Geodon. If continues to have behavioral issues will get psych involved  PT OT and TOC for discharge planning    Procedures: Family communication : none today Consults : CODE STATUS: full DVT Prophylaxis : xarelto Level of care: Progressive Status is: Inpatient Remains inpatient appropriate because: PT/OT to see and TOC for d/c planning    TOTAL TIME TAKING CARE OF THIS PATIENT: 35 minutes.  >50% time spent on counselling and coordination of care  Note: This dictation was prepared with Dragon dictation along with smaller phrase technology. Any transcriptional errors that result from this process are unintentional.  Fritzi Mandes M.D    [facility]   CC: Primary care physician; [name], NP

## 2021-12-16 NOTE — Plan of Care (Signed)
Patient's blood gas results were O2 62, ph 7.39, CO2 67, and bicarb 40.6, pro calcitonin was negative, Dr. Posey Pronto notified and antibiotics discontinued.  Patient to transfer to progressive unit, report given to Nate, RN the receiving nurse, patient to transfer on cardiac monitoring and in hospital bed. Patient's condition is stable.

## 2021-12-16 NOTE — Evaluation (Signed)
Physical Therapy Evaluation Patient Details Name: Terri Casey MRN: [id number] DOB: 1960-07-30 Today's Date: 12/16/2021  History of Present Illness  61 year old with history of asthma, COPD, chronic respiratory failure on home oxygen, diabetes, hypertension, diastolic heart failure, paroxysmal atrial fibrillation, OSA presenting today with respiratory distress, hypoxic, hypercapnic respiratory failure was placed on BiPAP by EDP and PCCM consulted for evaluation.   Clinical Impression  Pt admitted with above diagnosis. Pt received upright in bed with sitter in room. Agreeable to PT services. Pt hallucinating, limited historian at his time. Per EMR at last admission, pt is ambulatory with RW in community, has nursing aides 5 days/week that assist in bathing, dressing toileting. Initially stating no one comes to help her with these tasks then when Pryor Curia clarifies she says she does. Pt resting on 1 L/min HFNC at rest but per EMR is supposed to be on 3 L/min. Pt is mod-I with bed mobility and  desaturates to 87-89 at EOB. Titrated to 3L/min with return to > 90%. Pt ambulating to bathroom with RW after VC's for hand placement and performing continent void of bowel and bladder. Notable desaturation ot 75% post ambulation requiring titration to 6L/min HFNC and 2-3 min seated rest and PLB during toileting tasks to > 90%. Pt able to perform peri care with CGA and wash hands returning to recliner. Pt with min to mod VC's for RW sequencing in bathroom and at EOB where there is limited space but otherwise safe use of RW. Pt maintaining > 90% in recliner on 6 L/min HCNF upon return to recliner. Titrated to 1L/min HFNC maintaining 90-91%. All needs in reach. Will plan to rec RW at d/c with [facility] services in case pt does not have RW as she is unreliable historian currently. Pt currently with functional limitations due to the deficits listed below (see PT Problem List). Pt will benefit from skilled PT to increase their  independence and safety with mobility to allow discharge to the venue listed below.     Recommendations for follow up therapy are one component of a multi-disciplinary discharge planning process, led by the attending physician.  Recommendations may be updated based on patient status, additional functional criteria and insurance authorization.  Follow Up Recommendations Home health PT      Assistance Recommended at Discharge Frequent or constant Supervision/Assistance  Patient can return home with the following  A little help with walking and/or transfers;A little help with bathing/dressing/bathroom;Help with stairs or ramp for entrance;Assist for transportation;Assistance with cooking/housework    Equipment Recommendations Rolling walker (2 wheels)  Recommendations for Other Services       Functional Status Assessment       Precautions / Restrictions Precautions Precautions: Fall Restrictions Weight Bearing Restrictions: No      Mobility  Bed Mobility Overal bed mobility: Modified Independent               Patient Response: Cooperative  Transfers Overall transfer level: Needs assistance Equipment used: Rolling walker (2 wheels) Transfers: Sit to/from Stand Sit to Stand: Min assist           General transfer comment: modA from lowered toilet in bathroom    Ambulation/Gait Ambulation/Gait assistance: Min guard Gait Distance (Feet): 40 Feet Assistive device: Rolling walker (2 wheels) Gait Pattern/deviations: Step-to pattern, Trunk flexed       General Gait Details: Slowed gait cadence with heavy reliance on RW. GOod stbaility and safe use of RW otherwise. min VC's for use of RW in tight spaces  like bathroom  Stairs            Wheelchair Mobility    Modified Rankin (Stroke Patients Only)       Balance Overall balance assessment: Needs assistance Sitting-balance support: Bilateral upper extremity supported, Feet supported Sitting balance-Leahy  Scale: Good     Standing balance support: Bilateral upper extremity supported, During functional activity Standing balance-Leahy Scale: Fair                               Pertinent Vitals/Pain Pain Assessment Pain Assessment: Faces Faces Pain Scale: No hurt    Home Living Family/patient expects to be discharged to:: Private residence Living Arrangements: Children Available Help at Discharge: Family;Personal care attendant;Available PRN/intermittently Type of Home: House Home Access: Stairs to enter Entrance Stairs-Rails: Right Entrance Stairs-Number of Steps: 3-4   Home Layout: One level Home Equipment: BSC/3in1;Rolling Walker (2 wheels);Toilet riser;Cane - single point;Wheelchair - manual      Prior Function Prior Level of Function : Needs assist             Mobility Comments: RW at baseline ADLs Comments: Reports home aides 5 days/week     Hand Dominance   Dominant Hand: Right    Extremity/Trunk Assessment   Upper Extremity Assessment Upper Extremity Assessment: Defer to OT evaluation    Lower Extremity Assessment Lower Extremity Assessment: Generalized weakness    Cervical / Trunk Assessment Cervical / Trunk Assessment: Normal  Communication   Communication: No difficulties  Cognition Arousal/Alertness: Awake/alert   Overall Cognitive Status: No family/caregiver present to determine baseline cognitive functioning                                 General Comments: States she has no caregivers then states she has HH Nruses daily. Hallucinating about additional people in her room. A&O x4.        General Comments General comments (skin integrity, edema, etc.): SPO2 desaturated to 75% post ambulation to bathroom on 3L /min HFNC. Requiring 6L/min HFNC and 2-3 min PLB before return to > 90%.    Exercises Other Exercises Other Exercises: Role of PT in acute setting, d/c recs, safe use of RW, PLB   Assessment/Plan    PT  Assessment Patient needs continued PT services  PT Problem List Decreased strength;Decreased cognition;Decreased knowledge of use of DME;Decreased activity tolerance;Decreased mobility       PT Treatment Interventions DME instruction;Balance training;Gait training;Neuromuscular re-education;Stair training;Cognitive remediation;Functional mobility training;Patient/family education;Therapeutic activities;Therapeutic exercise    PT Goals (Current goals can be found in the Care Plan section)  Acute Rehab PT Goals PT Goal Formulation: Patient unable to participate in goal setting    Frequency Min 2X/week     Co-evaluation               AM-PAC PT "6 Clicks" Mobility  Outcome Measure Help needed turning from your back to your side while in a flat bed without using bedrails?: A Little Help needed moving from lying on your back to sitting on the side of a flat bed without using bedrails?: A Little Help needed moving to and from a bed to a chair (including a wheelchair)?: A Little Help needed standing up from a chair using your arms (e.g., wheelchair or bedside chair)?: A Little Help needed to walk in hospital room?: A Little Help needed climbing 3-5 steps with a  railing? : A Lot 6 Click Score: 17    End of Session Equipment Utilized During Treatment: Gait belt Activity Tolerance: Patient tolerated treatment well Patient left: in chair;with call bell/phone within reach;with chair alarm set;with nursing/sitter in room Nurse Communication: Mobility status PT Visit Diagnosis: Other abnormalities of gait and mobility (R26.89);Muscle weakness (generalized) (M62.81);Difficulty in walking, not elsewhere classified (R26.2)    Time: 4621-9471 PT Time Calculation (min) (ACUTE ONLY): 27 min   Charges:   PT Evaluation $PT Eval Moderate Complexity: 1 Mod PT Treatments $Gait Training: 8-22 mins        Salem Caster. Fairly IV, PT, DPT Physical Therapist- [facility]  12/16/2021, 3:32 PM

## 2021-12-17 DIAGNOSIS — J441 Chronic obstructive pulmonary disease with (acute) exacerbation: Secondary | ICD-10-CM | POA: Diagnosis not present

## 2021-12-17 DIAGNOSIS — F32A Depression, unspecified: Secondary | ICD-10-CM

## 2021-12-17 DIAGNOSIS — F411 Generalized anxiety disorder: Secondary | ICD-10-CM

## 2021-12-17 DIAGNOSIS — J9602 Acute respiratory failure with hypercapnia: Secondary | ICD-10-CM

## 2021-12-17 DIAGNOSIS — R4182 Altered mental status, unspecified: Secondary | ICD-10-CM | POA: Diagnosis not present

## 2021-12-17 DIAGNOSIS — F418 Other specified anxiety disorders: Secondary | ICD-10-CM

## 2021-12-17 LAB — CBC
HCT: 33.6 % — ABNORMAL LOW (ref 36.0–46.0)
Hemoglobin: 9.6 g/dL — ABNORMAL LOW (ref 12.0–15.0)
MCH: 30.5 pg (ref 26.0–34.0)
MCHC: 28.6 g/dL — ABNORMAL LOW (ref 30.0–36.0)
MCV: 106.7 fL — ABNORMAL HIGH (ref 80.0–100.0)
Platelets: 79 10*3/uL — ABNORMAL LOW (ref 150–400)
RBC: 3.15 MIL/uL — ABNORMAL LOW (ref 3.87–5.11)
RDW: 14.6 % (ref 11.5–15.5)
WBC: 4.1 10*3/uL (ref 4.0–10.5)
nRBC: 0 % (ref 0.0–0.2)

## 2021-12-17 MED ORDER — INFLUENZA VAC SPLIT QUAD 0.5 ML IM SUSY: 0.5000 mL | PREFILLED_SYRINGE | INTRAMUSCULAR | Status: DC | PRN

## 2021-12-17 MED ORDER — PREDNISONE 20 MG PO TABS
20.0000 mg | ORAL_TABLET | Freq: Every day | ORAL | Status: DC
  Administered 2021-12-18: 20 mg via ORAL
  Filled 2021-12-17: qty 1

## 2021-12-17 MED ORDER — HYDROXYZINE HCL 10 MG PO TABS
10.0000 mg | ORAL_TABLET | Freq: Three times a day (TID) | ORAL | Status: DC
  Administered 2021-12-17 – 2021-12-18 (×5): 10 mg via ORAL
  Filled 2021-12-17 (×5): qty 1

## 2021-12-17 MED ORDER — AMLODIPINE BESYLATE 10 MG PO TABS: 10.0000 mg | ORAL_TABLET | Freq: Every day | ORAL | Status: DC

## 2021-12-17 NOTE — Progress Notes (Signed)
[place] at [place] NAME: Sister Carbone    MR#:  [id number]  DATE OF BIRTH:  [birth year]  SUBJECTIVE:  patient transferred from [facility] to [facility] service for pickup. Came in with altered mental status. Noncompliance to meds. Found to be hypoxic and Hypercarbia No family during my evaluation.  Sitter placed for safety--pt stable at present  VITALS:  Blood pressure 118/63, pulse 74, temperature 98.6 F (37 C), temperature source Oral, resp. rate 18, height '5\' 9"'$  (1.753 m), weight 99 kg, SpO2 98 %.  PHYSICAL EXAMINATION:   GENERAL:  61 y.o.-year-old patient lying in the bed with no acute distress. Disheveled, obese LUNGS distant breath sounds bilaterally, no wheezing CARDIOVASCULAR: S1, S2 normal. No murmur   ABDOMEN: Soft, nontender, nondistended. Bowel sounds present.  EXTREMITIES: No  edema b/l.    NEUROLOGIC: nonfocal  patient is alert, ?some baseline cognitive decline   LABORATORY PANEL:  CBC Recent Labs  Lab 12/17/21 0458  WBC 4.1  HGB 9.6*  HCT 33.6*  PLT 79*     Chemistries  Recent Labs  Lab 12/14/21 1959 12/15/21 0115  NA 145 144  K 4.1 3.8  CL 99 100  CO2 40* 38*  GLUCOSE 115* 155*  BUN 13 16  CREATININE 0.66 0.70  CALCIUM 8.8* 8.9  MG  --  1.9  AST 14*  --   ALT 9  --   ALKPHOS 94  --   BILITOT 0.5  --     Cardiac Enzymes No results for input(s): "TROPONINI" in the last 168 hours. RADIOLOGY:  No results found.  Assessment and Plan  61 year old with history of asthma, COPD, chronic respiratory failure on home oxygen, diabetes, hypertension, diastolic heart failure, paroxysmal atrial fibrillation, OSA presenting today with respiratory distress, hypoxic, hypercapnic respiratory failure was placed on BiPAP.  Acute on chronic hypoxic hyper cardiac respiratory failure secondary to COPD exacerbations/ongoing tobacco abuse -- patient remained on BiPAP-- now changed to QHS and during that time. -- ABG will  shows improvement in PCO2 -- continue IV Solu-Medrol and bronchodilators-- change to PO steroid -patient-was on empiric antibiotic. Pro- calcitonin negative, white count normal, chest x-ray no lobar consolidation. -- Discontinue antibiotic and continue to monitor --assess for NIV--per TOC pt has been noncompliant with use at home and hands insurance will not pay for it. -- Continue bronchodilators and oxygen  History of chronic diastolic heart failure -- resume Lasix  Paroxysmal a fib -- resume metoprolol and Xarelto  Hypothyroidism -- continue Synthroid  Depression/anxiety -- continue Lexapro -- consulted psychiatry. Added Vistaril for anxiety. Continue other psych meds.  Noncompliance to medication, and IV, ongoing use of tobacco, refusing home health PT -- patient is at high risk for readmission due to significant issues with noncompliance. Family is aware of it   PT OT -- recommends home health PT. TOC for discharge planning. Patient has been noncompliant with home health in the past. Might get challenging to get home health.   Procedures: Family communication : spoke with [place]  consults : CODE STATUS: full DVT Prophylaxis : xarelto Level of care: Progressive Status is: Inpatient Remains inpatient appropriate because: PT/OT to see and TOC for d/c planning   D/c 12/18/2021  TOTAL TIME TAKING CARE OF THIS PATIENT: 35 minutes.  >50% time spent on counselling and coordination of care  Note: This dictation was prepared with Dragon dictation along with smaller phrase technology. Any transcriptional errors that result from this process are unintentional.  Fritzi Mandes M.D    [facility]   CC: Primary care physician; [name], NP

## 2021-12-17 NOTE — TOC Initial Note (Addendum)
Transition of Care [facility]) - Initial/Assessment Note    Patient Details  Name: Terri Casey MRN: [id number] Date of Birth: February 06, 1960  Transition of Care [facility]) CM/SW Contact:    Magnus Ivan, LCSW Phone Number: 12/17/2021, 10:48 AM  Clinical Narrative:              PT recommends Home Health and RW. Per MD, patient would also benefit from a NIV.  CSW spoke with patient regarding PT recs. Patient requested CSW call her daughter in law Terri Casey.  Called [place]. Patient lives at home with her son and daughter in law Terri Casey). PCP is [facility] in [place]. Pharmacy is [facility] in [place].  Terri Casey states patient has all needed DME at home including a RW, wheelchair, cane, BSC, oxygen (through [employer]). Terri Casey states patient had a Trilogy machine but it was recently picked up because patient was not using it.  Terri Casey states they have been having issues with patient having to come to the hospital. She states patient was set up with [facility] but they are no longer active with patient.  Terri Casey inquired if patient could go to short term rehab "for about 30 days" to get stronger before coming back home. CSW explained this is based on PT recs and [name] requested a PT reeval. Terri Casey requested to be present for PT reeval. CSW updated MD and PT.  Explained long term care placement process per her request as well and that they would need to get patient's Medicaid switched if they decide she needs long term care in the future. Terri Casey confirmed at this time they are just thinking patient needs STR.   11:00- PT can reeval patient today. Called Terri Casey back to see what time they can come. Spoke to son who states they will come around 1pm. PT updated.   2:50- Call to daughter in law to update her that PT feels patient needs HHPT, does not qualify for SNF. She verbalized understanding. She is agreeable to seeing if [facility] can take patient again. She stated  she does not have an agency preference if [facility] cannot take patient. Confirmed home address.  Terri Casey stated she may reach out to DSS about long term placement at ALF or [facility], she understands this is a lengthy process. She also plans to reach out to DSS about applying for personal care services at home.   Called [facility], Terri Casey stated he does not have a PT on staff so cannot take the referral.  CSW reached out to the following agencies and awaiting response: -[facility]- per Terri Casey they do not have the staff -[facility]- per Terri Casey they are unable to accept -[facility]- Terri Casey checked with her Admin and is unable to accept -[facility]- per Terri Casey they cannot service that area -[facility]- per Terri Casey they may be able to accept if paired with a Medicare -[facility]- per Terri Casey they do not have the staff -[name]- per Terri Casey they cannot accept        Expected Discharge Plan: [place] Barriers to Discharge: Continued Medical Work up   Patient Goals and CMS Choice Patient states their goals for this hospitalization and ongoing recovery are:: defers to daughter in law CMS Medicare.gov Compare Post Acute Care list provided to:: Patient Represenative (must comment) Choice offered to / list presented to : Adult Children  Expected Discharge Plan and Services Expected Discharge Plan: [place]       Living arrangements for the past  2 months: Single Family Home                                      Prior Living Arrangements/Services Living arrangements for the past 2 months: Single Family Home Lives with:: Adult Children Patient language and need for interpreter reviewed:: Yes Do you feel safe going back to the place where you live?: Yes      Need for Family Participation in Patient Care: Yes (Comment) Care giver support system in place?: Yes (comment) Current home services: DME Criminal Activity/Legal Involvement Pertinent to Current  Situation/Hospitalization: No - Comment as needed  Activities of Daily Living      Permission Sought/Granted Permission sought to share information with : Facility Sport and exercise psychologist, Family Supports Permission granted to share information with : Yes, Verbal Permission Granted     Permission granted to share info w AGENCY: [place], DME, SNF  Permission granted to share info w Relationship: daughter in [place]     Emotional Assessment       Orientation: : Oriented to Self, Oriented to Place, Oriented to  Time Alcohol / Substance Use: Not Applicable Psych Involvement: No (comment)  Admission diagnosis:  COPD exacerbation ([place]) [J44.1] Acute respiratory failure with hypercapnia (HCC) [J96.02] Altered mental status, unspecified altered mental status type [R41.82] Patient Active Problem List   Diagnosis Date Noted   Altered mental status 12/15/2021   Acute hypoxemic respiratory failure ([place]) 09/11/2021   Septic shock ([place]) 09/11/2021   Respiratory failure ([place]) 09/11/2021   Hashimoto's thyroiditis 09/05/2021   Chronic back pain greater than 3 months duration 09/05/2021   Allergic rhinitis 09/05/2021   Hypercapnia 09/05/2021   Osteopenia 09/05/2021   Periodic limb movements of sleep 09/05/2021   Suspicious nevus left buttock 03/19/2021   Acute hypercapnic respiratory failure ([place]) 03/16/2021   Hypothermia 03/16/2021   Chronic diastolic CHF (congestive heart failure) ([place]) 03/16/2021   Chronic respiratory failure with hypoxia and hypercapnia ([place]) 29/52/8413   Acute diastolic CHF (congestive heart failure) ([place]) 10/07/2019   Chronic low back pain    Encounter for intubation    Palliative care by specialist    Acute respiratory failure with hypoxia and hypercapnia ([place]) 10/05/2019   AF (paroxysmal atrial fibrillation) ([place]) 10/05/2019   Elevated brain natriuretic peptide (BNP) level 08/21/2019   Noncompliance with medication regimen 08/21/2019   Acute on chronic  respiratory failure with hypoxia and hypercapnia ([place]) 06/09/2019   Obesity, Class III, BMI 40-49.9 (morbid obesity) ([place]) 06/09/2019   Thrombocytopenia ([place]) 06/09/2019   Nontoxic multinodular goiter 07/06/2015   Pericardial effusion: Per 2 d echo 03/26/2015 03/27/2015   Acute on chronic respiratory failure ([place]) 03/26/2015   PNA (pneumonia) 03/26/2015   Nocturnal hypoxia 03/25/2015   Acute respiratory failure with hypoxia ([place]) 03/25/2015   COPD exacerbation ([place]) 03/25/2015   COPD (chronic obstructive pulmonary disease) ([place]) 03/25/2015   Anxiety    Hypothyroidism 03/03/2015   Vitamin D deficiency 03/03/2015   Hyperlipidemia 07/08/2007   CIGARETTE SMOKER 07/08/2007   Essential hypertension, benign 07/08/2007   EMPHYSEMA-on home O2 07/08/2007   Asthma 07/08/2007   COPD GOLD ? / still smoking 07/08/2007   Sleep apnea 07/08/2007   WEIGHT GAIN 07/08/2007   PCP:  [name], NP Pharmacy:   [place], [place] - [address] [place] [address] Phone: [phone] Fax: [phone]     Social Determinants  of Health (SDOH) Interventions    Readmission Risk Interventions    09/14/2021    3:21 PM 03/21/2021   10:52 AM 03/20/2021   12:14 PM  Readmission Risk Prevention Plan  Post Dischage Appt  Complete   Medication Screening  Complete Complete  Transportation Screening  Complete Complete  PCP or Specialist Appt within 3-5 Days Complete    HRI or [place] Complete    Social Work Consult for [place] Planning/Counseling Complete    Palliative Care Screening Not Applicable    Medication Review Press photographer) Complete

## 2021-12-17 NOTE — Consult Note (Signed)
[place] Psychiatry Consult   Reason for Consult:  agitation Referring Physician:  Dr Posey Pronto Patient Identification: Terri Casey MRN:  [id number] Principal Diagnosis: COPD exacerbation ([place]) Diagnosis:  Principal Problem:   COPD exacerbation ([place]) Active Problems:   Generalized anxiety disorder   Altered mental status   Total Time spent with patient: 45 minutes  Subjective:   Terri Casey is a 61 y.o. female patient admitted with COPD exacerbation.  HPI:  61 yo female admitted for a COPD exacerbation, consult for agitation.  Upon assessment, she stated, "I told them I have anxiety." She got anxious earlier to the point of having Geodon.  Calm on assessment and reports moderate to high anxiety.  Mild depression with no suicidal ideations.  She denies psychosis and no issues with sleep or appetite.  Agreeable to try some additional medications to help her anxiety, PRN hydroxyzine ordered.  Past Psychiatric History: depression, anxiety  Risk to Self:  none Risk to Others:  none Prior Inpatient Therapy:  none Prior Outpatient Therapy:  PCP  Past Medical History:  Past Medical History:  Diagnosis Date   Anxiety    Asthma    CHF (congestive heart failure) (HCC)    COPD (chronic obstructive pulmonary disease) ([place])    Diabetes mellitus without complication ([place])    Hypertension    Thyroid disease     Past Surgical History:  Procedure Laterality Date   ABDOMINAL HYSTERECTOMY     THORACENTESIS Left 09/18/2021   Procedure: THORACENTESIS;  Surgeon: Juanito Doom, MD;  Location: [place];  Service: Cardiopulmonary;  Laterality: Left;   Family History: No family history on file. Family Psychiatric  History: none Social History:  Social History   Substance and Sexual Activity  Alcohol Use No     Social History   Substance and Sexual Activity  Drug Use No    Social History   Socioeconomic History   Marital status: Single    Spouse name: Not on  file   Number of children: Not on file   Years of education: Not on file   Highest education level: Not on file  Occupational History   Not on file  Tobacco Use   Smoking status: Every Day    Packs/day: 0.50    Years: 20.00    Total pack years: 10.00    Types: Cigarettes   Smokeless tobacco: Never   Tobacco comments:    Patient reports 10-15 cigs at most.   Substance and Sexual Activity   Alcohol use: No   Drug use: No   Sexual activity: Not on file  Other Topics Concern   Not on file  Social History Narrative   Not on file   Social Determinants of Health   Financial Resource Strain: Not on file  Food Insecurity: Not on file  Transportation Needs: Not on file  Physical Activity: Not on file  Stress: Not on file  Social Connections: Not on file   Additional Social History:    Allergies:   Allergies  Allergen Reactions   Estrogens Hives   Mustard Seed Hives   Strawberry Extract Hives    Labs:  Results for orders placed or performed during the hospital encounter of 12/14/21 (from the past 48 hour(s))  Procalcitonin - Baseline     Status: None   Collection Time: 12/16/21  5:00 AM  Result Value Ref Range   Procalcitonin <0.10 ng/mL    Comment:        Interpretation: PCT (Procalcitonin) <=  0.5 ng/mL: Systemic infection (sepsis) is not likely. Local bacterial infection is possible. (NOTE)       Sepsis PCT Algorithm           Lower Respiratory Tract                                      Infection PCT Algorithm    ----------------------------     ----------------------------         PCT < 0.25 ng/mL                PCT < 0.10 ng/mL          Strongly encourage             Strongly discourage   discontinuation of antibiotics    initiation of antibiotics    ----------------------------     -----------------------------       PCT 0.25 - 0.50 ng/mL            PCT 0.10 - 0.25 ng/mL               OR       >80% decrease in PCT            Discourage initiation of                                             antibiotics      Encourage discontinuation           of antibiotics    ----------------------------     -----------------------------         PCT >= 0.50 ng/mL              PCT 0.26 - 0.50 ng/mL               AND        <80% decrease in PCT             Encourage initiation of                                             antibiotics       Encourage continuation           of antibiotics    ----------------------------     -----------------------------        PCT >= 0.50 ng/mL                  PCT > 0.50 ng/mL               AND         increase in PCT                  Strongly encourage                                      initiation of antibiotics    Strongly encourage escalation           of antibiotics                                     -----------------------------  PCT <= 0.25 ng/mL                                                 OR                                        > 80% decrease in PCT                                      Discontinue / Do not initiate                                             antibiotics  Performed at [facility], [address] [place], [place] [address]   Blood gas, arterial     Status: Abnormal   Collection Time: 12/16/21  9:46 AM  Result Value Ref Range   O2 Content 3.0 L/min   Delivery systems NASAL CANNULA    pH, Arterial 7.39 7.35 - 7.45   pCO2 arterial 67 (HH) 32 - 48 mmHg    Comment: CRITICAL RESULT CALLED TO, READ BACK BY AND VERIFIED WITH: NOTIFIED KELLY ISENHOUR RN, OF THE CRITICAL RESULT ON 12/16/2021 AT 2297. DW, RRT.    pO2, Arterial 62 (L) 83 - 108 mmHg   Bicarbonate 40.6 (H) 20.0 - 28.0 mmol/L   Acid-Base Excess 12.6 (H) 0.0 - 2.0 mmol/L   O2 Saturation 93.6 %   Patient temperature 37.0    Collection site LEFT RADIAL    Allens test (pass/fail) PASS PASS    Comment: Performed at [facility], [address] [place], [place]  [address]  CBC     Status: Abnormal   Collection Time: 12/17/21  4:58 AM  Result Value Ref Range   WBC 4.1 4.0 - 10.5 K/uL   RBC 3.15 (L) 3.87 - 5.11 MIL/uL   Hemoglobin 9.6 (L) 12.0 - 15.0 g/dL   HCT 33.6 (L) 36.0 - 46.0 %   MCV 106.7 (H) 80.0 - 100.0 fL   MCH 30.5 26.0 - 34.0 pg   MCHC 28.6 (L) 30.0 - 36.0 g/dL   RDW 14.6 11.5 - 15.5 %   Platelets 79 (L) 150 - 400 K/uL    Comment: Immature Platelet Fraction may be clinically indicated, consider ordering this additional test [id number]    nRBC 0.0 0.0 - 0.2 %    Comment: Performed at [facility], [address] [place], [place] [address]    Current Facility-Administered Medications  Medication Dose Route Frequency Provider Last Rate Last Admin   atorvastatin (LIPITOR) tablet 20 mg  20 mg Oral QHS Fritzi Mandes, MD   20 mg at 12/16/21 2238   docusate sodium (COLACE) capsule 100 mg  100 mg Oral BID PRN Mannam, Praveen, MD       escitalopram (LEXAPRO) tablet 20 mg  20 mg Oral Daily Fritzi Mandes, MD   20 mg at 85/63/14 9702   folic acid (FOLVITE) tablet 1 mg  1 mg Oral Daily Fritzi Mandes, MD   1 mg at 12/17/21 [id number]  furosemide (LASIX) tablet 40 mg  40 mg Oral Daily Fritzi Mandes, MD   40 mg at 12/17/21 1700   hydrOXYzine (ATARAX) tablet 10 mg  10 mg Oral TID Patrecia Pour, NP       ipratropium-albuterol (DUONEB) 0.5-2.5 (3) MG/3ML nebulizer solution 3 mL  3 mL Nebulization Q4H PRN Mannam, Praveen, MD       levothyroxine (SYNTHROID) tablet 200 mcg  200 mcg Oral [id number] Fritzi Mandes, MD   200 mcg at 12/17/21 0655   metoprolol tartrate (LOPRESSOR) tablet 12.5 mg  12.5 mg Oral BID Fritzi Mandes, MD   12.5 mg at 12/17/21 0834   mometasone-formoterol (DULERA) 200-5 MCG/ACT inhaler 2 puff  2 puff Inhalation BID Fritzi Mandes, MD   2 puff at 12/17/21 0835   pantoprazole (PROTONIX) EC tablet 40 mg  40 mg Oral Daily Fritzi Mandes, MD   40 mg at 12/17/21 0834   polyethylene glycol (MIRALAX / GLYCOLAX) packet 17 g  17 g Oral Daily PRN Mannam, Hart Robinsons,  MD       [START ON 12/18/2021] predniSONE (DELTASONE) tablet 20 mg  20 mg Oral Q breakfast Fritzi Mandes, MD       QUEtiapine (SEROQUEL) tablet 25 mg  25 mg Oral BID Dillard Cannon, Harsh, MD   25 mg at 12/17/21 4967   revefenacin (YUPELRI) nebulizer solution 175 mcg  175 mcg Nebulization Daily Mannam, Praveen, MD   175 mcg at 12/17/21 5916   rivaroxaban (XARELTO) tablet 20 mg  20 mg Oral Q supper Dillard Cannon, Harsh, MD   20 mg at 12/16/21 1628   topiramate (TOPAMAX) tablet 100 mg  100 mg Oral BID Fritzi Mandes, MD   100 mg at 12/17/21 3846   ziprasidone (GEODON) injection 10 mg  10 mg Intramuscular Q12H PRN Jeani Hawking, MD   10 mg at 12/16/21 0042    Musculoskeletal: Strength & Muscle Tone: decreased Gait & Station:  did not witness Patient leans: N/A  Psychiatric Specialty Exam: Physical Exam Vitals and nursing note reviewed.  Constitutional:      Appearance: Normal appearance.  HENT:     Head: Normocephalic.     Nose: Nose normal.  Pulmonary:     Effort: Pulmonary effort is normal.  Musculoskeletal:        General: Normal range of motion.     Cervical back: Normal range of motion.  Neurological:     General: No focal deficit present.     Mental Status: She is alert and oriented to person, place, and time.  Psychiatric:        Attention and Perception: Attention and perception normal.        Mood and Affect: Mood is anxious and depressed.        Speech: Speech normal.        Behavior: Behavior normal. Behavior is cooperative.        Thought Content: Thought content normal.        Cognition and Memory: Cognition and memory normal.        Judgment: Judgment normal.     Review of Systems  Psychiatric/Behavioral:  Positive for depression. The patient is nervous/anxious.   All other systems reviewed and are negative.   Blood pressure 128/77, pulse 66, temperature 98.5 F (36.9 C), temperature source Oral, resp. rate 18, height '5\' 9"'$  (1.753 m), weight 99 kg, SpO2 98 %.Body mass index is  32.23 kg/m.  General Appearance: Casual  Eye Contact:  Good  Speech:  Normal Rate  Volume:  Normal  Mood:  Anxious and Depressed  Affect:  Congruent  Thought Process:  Coherent  Orientation:  Full (Time, Place, and Person)  Thought Content:  WDL and Logical  Suicidal Thoughts:  No  Homicidal Thoughts:  No  Memory:  Immediate;   Good Recent;   Good Remote;   Good  Judgement:  Fair  Insight:  Fair  Psychomotor Activity:  Decreased  Concentration:  Concentration: Good and Attention Span: Good  Recall:  Good  Fund of Knowledge:  Good  Language:  Good  Akathisia:  No  Handed:  Right  AIMS (if indicated):     Assets:  Housing Leisure Time Resilience Social Support  ADL's:  Intact  Cognition:  WNL  Sleep:        Physical Exam: Physical Exam Vitals and nursing note reviewed.  Constitutional:      Appearance: Normal appearance.  HENT:     Head: Normocephalic.     Nose: Nose normal.  Pulmonary:     Effort: Pulmonary effort is normal.  Musculoskeletal:        General: Normal range of motion.     Cervical back: Normal range of motion.  Neurological:     General: No focal deficit present.     Mental Status: She is alert and oriented to person, place, and time.  Psychiatric:        Attention and Perception: Attention and perception normal.        Mood and Affect: Mood is anxious and depressed.        Speech: Speech normal.        Behavior: Behavior normal. Behavior is cooperative.        Thought Content: Thought content normal.        Cognition and Memory: Cognition and memory normal.        Judgment: Judgment normal.    Review of Systems  Psychiatric/Behavioral:  Positive for depression. The patient is nervous/anxious.   All other systems reviewed and are negative.  Blood pressure 128/77, pulse 66, temperature 98.5 F (36.9 C), temperature source Oral, resp. rate 18, height '5\' 9"'$  (1.753 m), weight 99 kg, SpO2 98 %. Body mass index is 32.23 kg/m.  Treatment Plan  Summary: General anxiety disorder: Hydroxyzine 10 mg TID started Lexapro 20 mg daily Seroquel 25 mg BID  Disposition: No evidence of imminent risk to self or others at present.   Patient does not meet criteria for psychiatric inpatient admission. Supportive therapy provided about ongoing stressors.  Waylan Boga, NP 12/17/2021 10:54 AM

## 2021-12-17 NOTE — Evaluation (Signed)
Occupational Therapy Evaluation Patient Details Name: Terri Casey MRN: [id number] DOB: 09-Oct-1960 Today's Date: 12/17/2021   History of Present Illness Pt is a 61 year old admitted with AMS, found to be  hypoxic and HyperCarbia; PMH significant for asthma, COPD, chronic respiratory failure on home oxygen, diabetes, hypertension, diastolic heart failure, paroxysmal atrial fibrillation, OSA presenting today with respiratory distress, hypoxic, hypercapnic respiratory failure was placed on BiPAP   Clinical Impression   Chart reviewed, pt greeted in room requiring encouragement for participation in OT evaluation. Pt is alert, oriented to self and place. Poor awareness of deficits and current level of functioning.  PTA pt endorses assist from family/aids for ADL/IADL, someone completes med management([name]). Pt presents with deficits in strength, endurance, activity tolerance, cognition affecting safe and optimal ADL completion. Recommend discharge home with [place], frequent/constant supervision. OT will continue to follow acutely.    Recommendations for follow up therapy are one component of a multi-disciplinary discharge planning process, led by the attending physician.  Recommendations may be updated based on patient status, additional functional criteria and insurance authorization.   Follow Up Recommendations  Home health OT     Assistance Recommended at Discharge Frequent or constant Supervision/Assistance  Patient can return home with the following A little help with walking and/or transfers;A little help with bathing/dressing/bathroom;Direct supervision/assist for medications management;Assistance with cooking/housework    Functional Status Assessment  Patient has had a recent decline in their functional status and demonstrates the ability to make significant improvements in function in a reasonable and predictable amount of time.  Equipment Recommendations  None recommended by OT     Recommendations for Other Services       Precautions / Restrictions Precautions Precautions: Fall Precaution Comments: watch spo2 while amb Restrictions Weight Bearing Restrictions: No      Mobility Bed Mobility Overal bed mobility: Needs Assistance Bed Mobility: Supine to Sit     Supine to sit: Supervision     General bed mobility comments: encouragement for participation    Transfers Overall transfer level: Needs assistance Equipment used: Rolling walker (2 wheels)                      Balance Overall balance assessment: Needs assistance Sitting-balance support: Bilateral upper extremity supported, Feet supported Sitting balance-Leahy Scale: Good     Standing balance support: Bilateral upper extremity supported, During functional activity Standing balance-Leahy Scale: Fair                             ADL either performed or assessed with clinical judgement   ADL Overall ADL's : Needs assistance/impaired Eating/Feeding: Set up;Sitting   Grooming: Wash/dry hands;Supervision/safety;Standing Grooming Details (indicate cue type and reason): after toileting             Lower Body Dressing: Supervision/safety Lower Body Dressing Details (indicate cue type and reason): socks seated at edge of bed Toilet Transfer: Supervision/safety;Rolling walker (2 wheels);Ambulation   Toileting- Clothing Manipulation and Hygiene: Supervision/safety;Sitting/lateral lean       Functional mobility during ADLs: Supervision/safety;Rolling walker (2 wheels) (160' in hall with RW with chair follow)       Vision Patient Visual Report: No change from baseline       Perception     Praxis      Pertinent Vitals/Pain Pain Assessment Pain Assessment: No/denies pain     Hand Dominance     Extremity/Trunk Assessment Upper Extremity Assessment Upper Extremity  Assessment: Generalized weakness   Lower Extremity Assessment Lower Extremity Assessment:  Generalized weakness   Cervical / Trunk Assessment Cervical / Trunk Assessment: Normal   Communication Communication Communication: No difficulties   Cognition Arousal/Alertness: Awake/alert Behavior During Therapy: WFL for tasks assessed/performed Overall Cognitive Status: No family/caregiver present to determine baseline cognitive functioning Area of Impairment: Orientation, Attention, Memory, Following commands, Safety/judgement, Awareness, Problem solving                 Orientation Level: Disoriented to, Situation Current Attention Level: Sustained Memory: Decreased short-term memory Following Commands: Follows one step commands with increased time Safety/Judgement: Decreased awareness of deficits Awareness: Intellectual Problem Solving: Slow processing, Requires verbal cues, Requires tactile cues General Comments: poor awareness of deficits, benefits of therapy interventions     General Comments  spo2 98% after amb to bathroom on 3 L via [place],  78% on 3 L via [place] after hallway amb, recovered 2-3 min    Exercises Other Exercises Other Exercises: edu re role of OT, role of rehab, discharge recommendations, home safety   Shoulder Instructions      Home Living Family/patient expects to be discharged to:: Private residence Living Arrangements: Children Available Help at Discharge: Family;Personal care attendant;Available PRN/intermittently Type of Home: House Home Access: Stairs to enter [employer] of Steps: 3-4 Entrance Stairs-Rails: Right Home Layout: One level     Bathroom Shower/Tub: Teacher, early years/pre: Handicapped height Bathroom Accessibility: Yes   Home Equipment: Public relations account executive (2 wheels);Toilet riser;Cane - single point;Wheelchair - manual          Prior Functioning/Environment Prior Level of Function : Needs assist             Mobility Comments: uses a RW at baseline ADLs Comments: reports she has assist  for med managment, IADLS; Someone helps with LB dressing; Pt endorses she performs grooming, UB dressing wtih SET UP; anticipate assist for bathing tasks        OT Problem List: Decreased strength;Decreased activity tolerance;Impaired balance (sitting and/or standing);Decreased knowledge of use of DME or AE;Decreased knowledge of precautions;Cardiopulmonary status limiting activity;Decreased safety awareness      OT Treatment/Interventions: Self-care/ADL training;Patient/family education;Therapeutic exercise;Balance training;Energy conservation;Therapeutic activities;DME and/or AE instruction    OT Goals(Current goals can be found in the care plan section) Acute Rehab OT Goals Patient Stated Goal: go home OT Goal Formulation: With patient Time For Goal Achievement: 12/31/21 Potential to Achieve Goals: Good ADL Goals Pt Will Perform Grooming: with supervision;sitting;standing Pt Will Transfer to Toilet: with supervision;ambulating Pt Will Perform Toileting - Clothing Manipulation and hygiene: with supervision;sit to/from stand  OT Frequency: Min 2X/week    Co-evaluation PT/OT/SLP Co-Evaluation/Treatment: Yes Reason for Co-Treatment: Complexity of the patient's impairments (multi-system involvement);To address functional/ADL transfers   OT goals addressed during session: ADL's and self-care      AM-PAC OT "6 Clicks" Daily Activity     Outcome Measure Help from another person eating meals?: None Help from another person taking care of personal grooming?: None Help from another person toileting, which includes using toliet, bedpan, or urinal?: None Help from another person bathing (including washing, rinsing, drying)?: A Little Help from another person to put on and taking off regular upper body clothing?: None Help from another person to put on and taking off regular lower body clothing?: A Little 6 Click Score: 22   End of Session Equipment Utilized During Treatment: Rolling  walker (2 wheels);Gait belt;Oxygen Nurse Communication: Mobility status  Activity Tolerance: Patient  tolerated treatment well Patient left: in chair;with call bell/phone within reach;with chair alarm set  OT Visit Diagnosis: Unsteadiness on feet (R26.81);Muscle weakness (generalized) (M62.81);History of falling (Z91.81)                Time: 9244-6286 OT Time Calculation (min): 30 min Charges:  OT General Charges $OT Visit: 1 Visit OT Evaluation $OT Eval Moderate Complexity: 1 Mod  Shanon Payor, OTD OTR/L  12/17/21, 3:25 PM

## 2021-12-17 NOTE — Progress Notes (Addendum)
Physical Therapy Treatment Patient Details Name: Terri Casey MRN: [id number] DOB: March 09, 1960 Today's Date: 12/17/2021   History of Present Illness 61 year old with history of asthma, COPD, chronic respiratory failure on home oxygen, diabetes, hypertension, diastolic heart failure, paroxysmal atrial fibrillation, OSA presenting today with respiratory distress, hypoxic, hypercapnic respiratory failure was placed on BiPAP by EDP and PCCM consulted for evaluation.    PT Comments    OT in room upon arrival in process of walking to bathroom.  Sats 98% after 20' with 3 lpm.  After voiding she is able to continue gait and walk x 1 lap 160' on unit with RW and min guard.  Sats 78% after she is cued to sit.  Reports fatigue when asked but does not volunteer information and would have kept walking if not asked how she felt.  No LOB or buckling but she does remain at increased risk for falls. During session she reports falls do occasionally happen after she takes her O2 off to go outside and smoke.  Stated she sits outside for 20 minutes or so and does endorse SOB.  Education regarding possibility of O2 sats dropping while smoking for an extended time.  She continues to refuse HHPT/OT despite education.  Family was to be in session at 1:00 today but did not show.  Communicated with team regarding discharge plan.  Pt would benefit from +1 assist for mobility for safety.   Recommendations for follow up therapy are one component of a multi-disciplinary discharge planning process, led by the attending physician.  Recommendations may be updated based on patient status, additional functional criteria and insurance authorization.  Follow Up Recommendations  Home health PT     Assistance Recommended at Discharge Frequent or constant Supervision/Assistance  Patient can return home with the following A little help with walking and/or transfers;A little help with bathing/dressing/bathroom;Help with stairs or  ramp for entrance;Assist for transportation;Assistance with cooking/housework   Equipment Recommendations  Rolling walker (2 wheels);BSC/3in1    Recommendations for Other Services       Precautions / Restrictions Precautions Precautions: Fall Restrictions Weight Bearing Restrictions: No     Mobility  Bed Mobility               General bed mobility comments: up wiht OT upon arrival    Transfers Overall transfer level: Needs assistance Equipment used: Rolling walker (2 wheels) Transfers: Sit to/from Stand Sit to Stand: Min guard                Ambulation/Gait Ambulation/Gait assistance: Min guard Gait Distance (Feet): 160 Feet Assistive device: Rolling walker (2 wheels) Gait Pattern/deviations: Step-to pattern, Trunk flexed Gait velocity: decreased     General Gait Details: generally steady with walker support but remains at increased risk for falls due to poor safety awarenss   Stairs             Wheelchair Mobility    Modified Rankin (Stroke Patients Only)       Balance Overall balance assessment: Needs assistance Sitting-balance support: Bilateral upper extremity supported, Feet supported Sitting balance-Leahy Scale: Good     Standing balance support: Bilateral upper extremity supported, During functional activity Standing balance-Leahy Scale: Fair                              Cognition Arousal/Alertness: Awake/alert Behavior During Therapy: WFL for tasks assessed/performed Overall Cognitive Status: Within Functional Limits for tasks assessed  General Comments: poor safety awarness        Exercises      General Comments        Pertinent Vitals/Pain Pain Assessment Pain Assessment: No/denies pain    Home Living                          Prior Function            PT Goals (current goals can now be found in the care plan section) Progress towards PT  goals: Progressing toward goals    Frequency    Min 2X/week      PT Plan Current plan remains appropriate    Co-evaluation              AM-PAC PT "6 Clicks" Mobility   Outcome Measure  Help needed turning from your back to your side while in a flat bed without using bedrails?: A Little Help needed moving from lying on your back to sitting on the side of a flat bed without using bedrails?: A Little Help needed moving to and from a bed to a chair (including a wheelchair)?: A Little Help needed standing up from a chair using your arms (e.g., wheelchair or bedside chair)?: A Little Help needed to walk in hospital room?: A Little Help needed climbing 3-5 steps with a railing? : A Lot 6 Click Score: 17    End of Session Equipment Utilized During Treatment: Gait belt Activity Tolerance: Patient tolerated treatment well Patient left: in chair;with call bell/phone within reach;with chair alarm set Nurse Communication: Mobility status PT Visit Diagnosis: Other abnormalities of gait and mobility (R26.89);Muscle weakness (generalized) (M62.81);Difficulty in walking, not elsewhere classified (R26.2)     Time: 2725-3664 PT Time Calculation (min) (ACUTE ONLY): 17 min  Charges:  $Gait Training: 8-22 mins                   Chesley Noon, PTA 12/17/21, 1:47 PM

## 2021-12-18 DIAGNOSIS — J441 Chronic obstructive pulmonary disease with (acute) exacerbation: Secondary | ICD-10-CM | POA: Diagnosis not present

## 2021-12-18 LAB — LEGIONELLA PNEUMOPHILA SEROGP 1 UR AG: L. pneumophila Serogp 1 Ur Ag: NEGATIVE

## 2021-12-18 MED ORDER — HYDROXYZINE HCL 10 MG PO TABS: 10.0000 mg | ORAL_TABLET | Freq: Three times a day (TID) | ORAL | 0 refills | Status: DC

## 2021-12-18 MED ORDER — PREDNISONE 20 MG PO TABS: 20.0000 mg | ORAL_TABLET | Freq: Every day | ORAL | 0 refills | Status: AC

## 2021-12-18 NOTE — Progress Notes (Signed)
Plan for pt to be discharged home.  Call placed to pt's son Harrell Gave at approx 1500 to arrange pick up.  He states he will bring pt her clothing and portable 02 and will be on his way to the hospital.

## 2021-12-18 NOTE — Progress Notes (Signed)
Discharge instructions reviewed with pt and son who verbalizid understanding.  Pt already has a PCP appt scheduled for 01/04/22.  Plan for outpt PT as well (see prior CM note).  IV's removed earlier.  Patient dc'd with home 02 via wheelchair with NT without incident.

## 2021-12-18 NOTE — Progress Notes (Signed)
PT Cancellation Note  Patient Details Name: Terri Casey MRN: [id number] DOB: [birth year]   Cancelled Treatment:    Reason Eval/Treat Not Completed: Other (comment)  Offered and encouraged but she declined stating she needs to rest.  DC planned for today.   Chesley Noon 12/18/2021, 1:31 PM

## 2021-12-18 NOTE — TOC Transition Note (Signed)
Transition of Care [facility]) - CM/SW Discharge Note   Patient Details  Name: KYNSLIE RINGLE MRN: [id number] Date of Birth: [birth year]  [facility] CM/SW Contact:  Tiburcio Bash, LCSW Phone Number: 12/18/2021, 1:21 PM   Clinical Narrative:     Patient to dc home today, no [place] agency has accepted. CSW spoke with family member [name] who is in agreement with patient being set up with outpatient PT at [facility]. Referral has been sent and clinicals have been faxed to [phone] .   No further dc needs at this time.   Final next level of care: Home/Self Care Barriers to Discharge: No Barriers Identified   Patient Goals and CMS Choice Patient states their goals for this hospitalization and ongoing recovery are:: to go home CMS Medicare.gov Compare Post Acute Care list provided to:: Patient Choice offered to / list presented to : Patient  Discharge Placement                       Discharge Plan and Services                                     Social Determinants of Health (SDOH) Interventions     Readmission Risk Interventions    09/14/2021    3:21 PM 03/21/2021   10:52 AM 03/20/2021   12:14 PM  Readmission Risk Prevention Plan  Post Dischage Appt  Complete   Medication Screening  Complete Complete  Transportation Screening  Complete Complete  PCP or Specialist Appt within 3-5 Days Complete    HRI or [place] Complete    Social Work Consult for [place] Planning/Counseling Complete    Palliative Care Screening Not Applicable    Medication Review Press photographer) Complete

## 2021-12-18 NOTE — Progress Notes (Signed)
     [place] REFERRAL        Occupational Therapy * Physical Therapy * Speech Therapy                           DATE 12/18/21  PATIENT NAME Mykeisha Dysert PATIENT MRN [id number]      DIAGNOSIS/DIAGNOSIS CODE J44.1  DATE OF DISCHARGE: 12/18/21  PRIMARY CARE PHYSICIAN:   Mallie Snooks NP  PCP PHONE/FAX :[phone]     Dear Provider (Name: [facility] outpatient __  Fax: [phone]   I certify that I have examined this patient and that occupational/physical/speech therapy is necessary on an outpatient basis.    The patient has expressed interest in completing their recommended course of therapy at your  location.  Once a formal order from the patient's primary care physician has been obtained, please  contact him/her to schedule an appointment for evaluation at your earliest convenience.   [ X]  Physical Therapy Evaluate and Treat  [  ]  Occupational Therapy Evaluate and Treat  [  ]  Speech Therapy Evaluate and Treat         The patient's primary care physician (listed above) must furnish and be responsible for a formal order such that the recommended services may be furnished while under the primary physician's care, and that the plan of care will be established and reviewed every 30 days (or more often if condition necessitates).  Kelby Fam, [place], MSW, [place]

## 2021-12-18 NOTE — Discharge Summary (Signed)
Physician Discharge Summary   Patient: Terri Casey MRN: [id number] DOB: 13-Nov-1960  Admit date:     12/14/2021  Discharge date: 12/18/21  Discharge Physician: Fritzi Mandes   PCP: [name], NP   Recommendations at discharge:    F/u PCP in 1-2 weeks Stop smoking Use your oxygen and inhalers as before  Discharge Diagnoses: Principal Problem:   COPD exacerbation ([place]) Active Problems:   Altered mental status   Generalized anxiety disorder   Depression   Hospital Course: 61 year old with history of asthma, COPD, chronic respiratory failure on home oxygen, diabetes, hypertension, diastolic heart failure, paroxysmal atrial fibrillation, OSA presenting today with respiratory distress, hypoxic, hypercapnic respiratory failure was placed on BiPAP.   Acute on chronic hypoxic hyper cardiac respiratory failure secondary to COPD exacerbations/ongoing tobacco abuse -- patient remained on BiPAP-- now changed to QHS and during that time. -- ABG will shows improvement in PCO2 -- continue IV Solu-Medrol and bronchodilators-- change to PO steroid -patient-was on empiric antibiotic. Pro- calcitonin negative, white count normal, chest x-ray no lobar consolidation. -- Discontinue antibiotic and continue to monitor --assess for NIV--per TOC pt has been noncompliant with use at home and insurance will not cover -- Continue bronchodilators and oxygen --overall at baseline   History of chronic diastolic heart failure -- resume Lasix   Paroxysmal a fib -- resume Xarelto  --HR 55 and bp soft--holding metoprolol at dc  Hypothyroidism -- continue Synthroid   Depression/anxiety -- continue Lexapro -- consulted psychiatry. Added Vistaril for anxiety. Continue other psych meds.   Noncompliance to medication, and IV, ongoing use of tobacco, refusing home health PT -- patient is at high risk for readmission due to significant issues with noncompliance. Family is aware of it     PT OT  -- recommends home health PT.--arranged out pt OT TOC for discharge planning.     Family communication : spoke with [facility] 12/17 consults : CODE STATUS: full DVT Prophylaxis : xarelto  D/c home  Diet recommendation:  Discharge Diet Orders (From admission, onward)     Start     Ordered   12/18/21 0000  Diet - low sodium heart healthy        12/18/21 1316           Cardiac diet DISCHARGE MEDICATION: Allergies as of 12/18/2021       Reactions   Estrogens Hives   Mustard Seed Hives   Strawberry Extract Hives        Medication List     STOP taking these medications    amLODipine 10 MG tablet Commonly known as: NORVASC   metoprolol tartrate 25 MG tablet Commonly known as: LOPRESSOR       TAKE these medications    Advair Diskus 500-50 MCG/ACT Aepb Generic drug: fluticasone-salmeterol Inhale 1 puff into the lungs in the morning and at bedtime.   albuterol (2.5 MG/3ML) 0.083% nebulizer solution Commonly known as: PROVENTIL Take 3 mLs (2.5 mg total) by nebulization every 6 (six) hours as needed for wheezing or shortness of breath.   albuterol 108 (90 Base) MCG/ACT inhaler Commonly known as: ProAir HFA 2 puffs every 4 hours as needed only  if your can't catch your breath   atorvastatin 20 MG tablet Commonly known as: LIPITOR Take 20 mg by mouth at bedtime.   bethanechol 5 MG tablet Commonly known as: URECHOLINE Take 5 mg by mouth 2 (two) times daily.   escitalopram 20 MG tablet Commonly known as: LEXAPRO Take 20 mg  by mouth daily.   folic acid 1 MG tablet Commonly known as: FOLVITE Take 1 tablet (1 mg total) by mouth daily.   furosemide 40 MG tablet Commonly known as: LASIX Take 1 tablet (40 mg total) by mouth daily.   hydrocortisone cream 1 % Apply topically 2 (two) times daily.   hydrOXYzine 10 MG tablet Commonly known as: ATARAX Take 1 tablet (10 mg total) by mouth 3 (three) times daily.   levothyroxine 200 MCG tablet Commonly known  as: SYNTHROID Take 1 tablet (200 mcg total) by mouth daily at 6 (six) AM.   oxyCODONE-acetaminophen 10-325 MG tablet Commonly known as: PERCOCET Take 1 tablet by mouth 4 (four) times daily.   pantoprazole 40 MG tablet Commonly known as: PROTONIX Take 1 tablet (40 mg total) by mouth daily.   predniSONE 20 MG tablet Commonly known as: DELTASONE Take 1 tablet (20 mg total) by mouth daily with breakfast for 4 days. Start taking on: December 19, 2021   Stiolto Respimat 2.5-2.5 MCG/ACT Aers Generic drug: Tiotropium Bromide-Olodaterol Inhale 2 each into the lungs daily.   SUMAtriptan 50 MG tablet Commonly known as: IMITREX Take 50 mg by mouth every 2 (two) hours as needed for migraine.   topiramate 100 MG tablet Commonly known as: TOPAMAX Take 100 mg by mouth 2 (two) times daily.   Vitamin D (Ergocalciferol) 1.25 MG (50000 UNIT) Caps capsule Commonly known as: DRISDOL Take 50,000 Units by mouth once a week.   Xarelto 20 MG Tabs tablet Generic drug: rivaroxaban Take 20 mg by mouth daily.        Follow-up Information     [name], NP. Schedule an appointment as soon as possible for a visit in 1 week(s).   Specialty: Family Medicine Why: hospital f/u Contact information: [address] [place] [address] [phone]                Moffat Weights   12/15/21 0200 12/18/21 0438  Weight: 99 kg 101 kg     Condition at discharge: fair  The results of significant diagnostics from this hospitalization (including imaging, microbiology, ancillary and laboratory) are listed below for reference.   Imaging Studies: CT Head Wo Contrast  Result Date: 12/14/2021 CLINICAL DATA:  Altered mental status. Respiratory distress. Confusion. EXAM: CT HEAD WITHOUT CONTRAST TECHNIQUE: Contiguous axial images were obtained from the base of the skull through the vertex without intravenous contrast. RADIATION DOSE REDUCTION: This exam was performed according to the  departmental dose-optimization program which includes automated exposure control, adjustment of the mA and/or kV according to patient size and/or use of iterative reconstruction technique. COMPARISON:  09/11/2021 FINDINGS: Brain: No evidence of acute infarction, hemorrhage, hydrocephalus, extra-axial collection or mass lesion/mass effect. Vascular: No hyperdense vessel or unexpected calcification. Skull: Normal. Negative for fracture or focal lesion. Sinuses/Orbits: No acute finding. Other: None. IMPRESSION: No acute intracranial abnormalities. Electronically Signed   By: Lucienne Capers M.D.   On: 12/14/2021 20:31   DG Chest Port 1 View  Result Date: 12/14/2021 CLINICAL DATA:  Sepsis EXAM: PORTABLE CHEST 1 VIEW COMPARISON:  09/23/2021, 09/17/2021 FINDINGS: Stable cardiomegaly with prominence of the central pulmonary vasculature. Streaky bibasilar opacities. Possible trace left pleural effusion. No pneumothorax. IMPRESSION: Streaky bibasilar opacities, atelectasis versus pneumonia. Possible trace left pleural effusion. Electronically Signed   By: Davina Poke D.O.   On: 12/14/2021 20:13    Microbiology: Results for orders placed or performed during the hospital encounter of 12/14/21  Blood Culture (routine  x 2)     Status: None (Preliminary result)   Collection Time: 12/14/21  7:59 PM   Specimen: BLOOD  Result Value Ref Range Status   Specimen Description BLOOD LEFT ARM  Final   Special Requests   Final    BOTTLES DRAWN AEROBIC AND ANAEROBIC Blood Culture results may not be optimal due to an inadequate volume of blood received in culture bottles   Culture   Final    NO GROWTH 4 DAYS Performed at [facility], [address], [place], [place] [address]    Report Status PENDING  Incomplete  Resp panel by RT-PCR (RSV, Flu A&B, Covid) Anterior Nasal Swab     Status: None   Collection Time: 12/14/21  7:59 PM   Specimen: Anterior Nasal Swab  Result Value Ref Range Status   SARS  Coronavirus 2 by RT PCR NEGATIVE NEGATIVE Final   Influenza A by PCR NEGATIVE NEGATIVE Final   Influenza B by PCR NEGATIVE NEGATIVE Final   Resp Syncytial Virus by PCR NEGATIVE NEGATIVE Final    Comment: Performed at [facility], [address] [place], [place] [address]  Blood Culture (routine x 2)     Status: None (Preliminary result)   Collection Time: 12/14/21  9:18 PM   Specimen: BLOOD  Result Value Ref Range Status   Specimen Description BLOOD LEFT FOREARM  Final   Special Requests   Final    BOTTLES DRAWN AEROBIC AND ANAEROBIC Blood Culture results may not be optimal due to an inadequate volume of blood received in culture bottles   Culture   Final    NO GROWTH 4 DAYS Performed at [facility], [address] [place], [place] [address]    Report Status PENDING  Incomplete  MRSA Next Gen by PCR, Nasal     Status: None   Collection Time: 12/15/21  1:31 AM   Specimen: Nasal Mucosa; Nasal Swab  Result Value Ref Range Status   MRSA by PCR Next Gen NOT DETECTED NOT DETECTED Final    Comment: (NOTE) The GeneXpert MRSA Assay (FDA approved for NASAL specimens only), is one component of a comprehensive MRSA colonization surveillance program. It is not intended to diagnose MRSA infection nor to guide or monitor treatment for MRSA infections. Test performance is not FDA approved in patients less than 39 years old. Performed at [facility], [address] [place], [place] [address]     Labs: CBC: Recent Labs  Lab 12/14/21 1959 12/15/21 0115 12/17/21 0458  WBC 5.0 5.6 4.1  NEUTROABS 3.0  --   --   HGB 11.7* 11.5* 9.6*  HCT 43.8 42.7 33.6*  MCV 113.8* 113.0* 106.7*  PLT 89* 80* 79*   Basic Metabolic Panel: Recent Labs  Lab 12/14/21 1959 12/15/21 0115  NA 145 144  K 4.1 3.8  CL 99 100  CO2 40* 38*  GLUCOSE 115* 155*  BUN 13 16  CREATININE 0.66 0.70  CALCIUM 8.8* 8.9  MG  --  1.9  PHOS  --  2.9   Liver Function Tests: Recent  Labs  Lab 12/14/21 1959  AST 14*  ALT 9  ALKPHOS 94  BILITOT 0.5  PROT 6.5  ALBUMIN 3.5   CBG: Recent Labs  Lab 12/15/21 0126  GLUCAP 137*    Discharge time spent: greater than 30 minutes.  Signed: Fritzi Mandes, MD [facility] 12/18/2021

## 2021-12-19 LAB — CULTURE, BLOOD (ROUTINE X 2)
Culture: NO GROWTH
Culture: NO GROWTH

## 2022-03-01 ENCOUNTER — Encounter: Payer: Self-pay | Admitting: Radiology

## 2022-08-16 ENCOUNTER — Emergency Department: Payer: Medicaid Other

## 2022-08-16 ENCOUNTER — Inpatient Hospital Stay
Admission: EM | Admit: 2022-08-16 | Discharge: 2022-08-24 | DRG: 208 | Disposition: A | Discharge status: Skilled Nursing Facility | Payer: Medicaid Other | Attending: Internal Medicine | Admitting: Internal Medicine

## 2022-08-16 ENCOUNTER — Other Ambulatory Visit: Payer: Self-pay

## 2022-08-16 ENCOUNTER — Inpatient Hospital Stay: Payer: Medicaid Other

## 2022-08-16 DIAGNOSIS — D539 Nutritional anemia, unspecified: Secondary | ICD-10-CM | POA: Diagnosis present

## 2022-08-16 DIAGNOSIS — J9620 Acute and chronic respiratory failure, unspecified whether with hypoxia or hypercapnia: Secondary | ICD-10-CM | POA: Diagnosis present

## 2022-08-16 DIAGNOSIS — E785 Hyperlipidemia, unspecified: Secondary | ICD-10-CM | POA: Diagnosis present

## 2022-08-16 DIAGNOSIS — F17210 Nicotine dependence, cigarettes, uncomplicated: Secondary | ICD-10-CM | POA: Diagnosis present

## 2022-08-16 DIAGNOSIS — T380X5A Adverse effect of glucocorticoids and synthetic analogues, initial encounter: Secondary | ICD-10-CM | POA: Diagnosis present

## 2022-08-16 DIAGNOSIS — J9811 Atelectasis: Secondary | ICD-10-CM | POA: Diagnosis present

## 2022-08-16 DIAGNOSIS — E669 Obesity, unspecified: Secondary | ICD-10-CM | POA: Diagnosis present

## 2022-08-16 DIAGNOSIS — Z91018 Allergy to other foods: Secondary | ICD-10-CM

## 2022-08-16 DIAGNOSIS — Z79890 Hormone replacement therapy (postmenopausal): Secondary | ICD-10-CM

## 2022-08-16 DIAGNOSIS — D696 Thrombocytopenia, unspecified: Secondary | ICD-10-CM | POA: Diagnosis present

## 2022-08-16 DIAGNOSIS — Z7951 Long term (current) use of inhaled steroids: Secondary | ICD-10-CM

## 2022-08-16 DIAGNOSIS — I110 Hypertensive heart disease with heart failure: Secondary | ICD-10-CM | POA: Diagnosis present

## 2022-08-16 DIAGNOSIS — Z888 Allergy status to other drugs, medicaments and biological substances status: Secondary | ICD-10-CM

## 2022-08-16 DIAGNOSIS — F419 Anxiety disorder, unspecified: Secondary | ICD-10-CM | POA: Diagnosis present

## 2022-08-16 DIAGNOSIS — Z79891 Long term (current) use of opiate analgesic: Secondary | ICD-10-CM

## 2022-08-16 DIAGNOSIS — J9622 Acute and chronic respiratory failure with hypercapnia: Secondary | ICD-10-CM | POA: Diagnosis present

## 2022-08-16 DIAGNOSIS — G4733 Obstructive sleep apnea (adult) (pediatric): Secondary | ICD-10-CM | POA: Diagnosis present

## 2022-08-16 DIAGNOSIS — Z6832 Body mass index (BMI) 32.0-32.9, adult: Secondary | ICD-10-CM

## 2022-08-16 DIAGNOSIS — E538 Deficiency of other specified B group vitamins: Secondary | ICD-10-CM | POA: Diagnosis present

## 2022-08-16 DIAGNOSIS — J9621 Acute and chronic respiratory failure with hypoxia: Principal | ICD-10-CM | POA: Diagnosis present

## 2022-08-16 DIAGNOSIS — Z79899 Other long term (current) drug therapy: Secondary | ICD-10-CM

## 2022-08-16 DIAGNOSIS — J441 Chronic obstructive pulmonary disease with (acute) exacerbation: Secondary | ICD-10-CM | POA: Diagnosis present

## 2022-08-16 DIAGNOSIS — E8729 Other acidosis: Secondary | ICD-10-CM | POA: Diagnosis present

## 2022-08-16 DIAGNOSIS — E1165 Type 2 diabetes mellitus with hyperglycemia: Secondary | ICD-10-CM | POA: Diagnosis present

## 2022-08-16 DIAGNOSIS — F32A Depression, unspecified: Secondary | ICD-10-CM | POA: Diagnosis present

## 2022-08-16 DIAGNOSIS — R0602 Shortness of breath: Secondary | ICD-10-CM | POA: Diagnosis present

## 2022-08-16 DIAGNOSIS — T597X1A Toxic effect of carbon dioxide, accidental (unintentional), initial encounter: Secondary | ICD-10-CM | POA: Diagnosis present

## 2022-08-16 DIAGNOSIS — Z1152 Encounter for screening for COVID-19: Secondary | ICD-10-CM

## 2022-08-16 DIAGNOSIS — Z7901 Long term (current) use of anticoagulants: Secondary | ICD-10-CM

## 2022-08-16 DIAGNOSIS — I5032 Chronic diastolic (congestive) heart failure: Secondary | ICD-10-CM | POA: Diagnosis present

## 2022-08-16 DIAGNOSIS — J9602 Acute respiratory failure with hypercapnia: Principal | ICD-10-CM

## 2022-08-16 DIAGNOSIS — Z90710 Acquired absence of both cervix and uterus: Secondary | ICD-10-CM

## 2022-08-16 DIAGNOSIS — E039 Hypothyroidism, unspecified: Secondary | ICD-10-CM | POA: Diagnosis present

## 2022-08-16 DIAGNOSIS — G928 Other toxic encephalopathy: Secondary | ICD-10-CM | POA: Diagnosis present

## 2022-08-16 DIAGNOSIS — T3695XA Adverse effect of unspecified systemic antibiotic, initial encounter: Secondary | ICD-10-CM | POA: Diagnosis not present

## 2022-08-16 DIAGNOSIS — B3731 Acute candidiasis of vulva and vagina: Secondary | ICD-10-CM | POA: Diagnosis present

## 2022-08-16 DIAGNOSIS — I480 Paroxysmal atrial fibrillation: Secondary | ICD-10-CM | POA: Diagnosis present

## 2022-08-16 DIAGNOSIS — K521 Toxic gastroenteritis and colitis: Secondary | ICD-10-CM | POA: Diagnosis not present

## 2022-08-16 LAB — URINALYSIS, ROUTINE W REFLEX MICROSCOPIC
Bilirubin Urine: NEGATIVE
Glucose, UA: NEGATIVE mg/dL
Ketones, ur: NEGATIVE mg/dL
Leukocytes,Ua: NEGATIVE
Nitrite: NEGATIVE
Protein, ur: 100 mg/dL — AB
Specific Gravity, Urine: 1.018 (ref 1.005–1.030)
pH: 5 (ref 5.0–8.0)

## 2022-08-16 LAB — CBC WITH DIFFERENTIAL/PLATELET
Abs Immature Granulocytes: 0.02 10*3/uL (ref 0.00–0.07)
Basophils Absolute: 0 10*3/uL (ref 0.0–0.1)
Basophils Relative: 0 %
Eosinophils Absolute: 0 10*3/uL (ref 0.0–0.5)
Eosinophils Relative: 0 %
HCT: 45.9 % (ref 36.0–46.0)
Hemoglobin: 12.8 g/dL (ref 12.0–15.0)
Immature Granulocytes: 0 %
Lymphocytes Relative: 19 %
Lymphs Abs: 0.9 10*3/uL (ref 0.7–4.0)
MCH: 32.1 pg (ref 26.0–34.0)
MCHC: 27.9 g/dL — ABNORMAL LOW (ref 30.0–36.0)
MCV: 115 fL — ABNORMAL HIGH (ref 80.0–100.0)
Monocytes Absolute: 0.2 10*3/uL (ref 0.1–1.0)
Monocytes Relative: 5 %
Neutro Abs: 3.6 10*3/uL (ref 1.7–7.7)
Neutrophils Relative %: 76 %
Platelets: 125 10*3/uL — ABNORMAL LOW (ref 150–400)
RBC: 3.99 MIL/uL (ref 3.87–5.11)
RDW: 12.9 % (ref 11.5–15.5)
Smear Review: NORMAL
WBC: 4.8 10*3/uL (ref 4.0–10.5)
nRBC: 0 % (ref 0.0–0.2)

## 2022-08-16 LAB — URINE DRUG SCREEN, QUALITATIVE (ARMC ONLY)
Amphetamines, Ur Screen: NOT DETECTED
Barbiturates, Ur Screen: NOT DETECTED
Benzodiazepine, Ur Scrn: NOT DETECTED
Cannabinoid 50 Ng, Ur ~~LOC~~: NOT DETECTED
Cocaine Metabolite,Ur ~~LOC~~: NOT DETECTED
MDMA (Ecstasy)Ur Screen: NOT DETECTED
Methadone Scn, Ur: NOT DETECTED
Opiate, Ur Screen: NOT DETECTED
Phencyclidine (PCP) Ur S: NOT DETECTED
Tricyclic, Ur Screen: NOT DETECTED

## 2022-08-16 LAB — COMPREHENSIVE METABOLIC PANEL
ALT: 11 U/L (ref 0–44)
AST: 15 U/L (ref 15–41)
Albumin: 4 g/dL (ref 3.5–5.0)
Alkaline Phosphatase: 95 U/L (ref 38–126)
Anion gap: 5 (ref 5–15)
BUN: 10 mg/dL (ref 8–23)
CO2: 41 mmol/L — ABNORMAL HIGH (ref 22–32)
Calcium: 9.2 mg/dL (ref 8.9–10.3)
Chloride: 97 mmol/L — ABNORMAL LOW (ref 98–111)
Creatinine, Ser: 0.67 mg/dL (ref 0.44–1.00)
GFR, Estimated: >60 mL/min (ref >60)
Glucose, Bld: 118 mg/dL — ABNORMAL HIGH (ref 70–99)
Potassium: 4.3 mmol/L (ref 3.5–5.1)
Sodium: 143 mmol/L (ref 135–145)
Total Bilirubin: 0.5 mg/dL (ref 0.3–1.2)
Total Protein: 7.2 g/dL (ref 6.5–8.1)

## 2022-08-16 LAB — GLUCOSE, CAPILLARY: Glucose-Capillary: 109 mg/dL — ABNORMAL HIGH (ref 70–99)

## 2022-08-16 LAB — TROPONIN I (HIGH SENSITIVITY)
Troponin I (High Sensitivity): 13 ng/L (ref <18)
Troponin I (High Sensitivity): 14 ng/L (ref <18)

## 2022-08-16 LAB — BRAIN NATRIURETIC PEPTIDE: B Natriuretic Peptide: 53 pg/mL (ref 0.0–100.0)

## 2022-08-16 LAB — SARS CORONAVIRUS 2 BY RT PCR: SARS Coronavirus 2 by RT PCR: NEGATIVE

## 2022-08-16 MED ORDER — SUCCINYLCHOLINE CHLORIDE 20 MG/ML IJ SOLN
INTRAMUSCULAR | Status: AC | PRN
Start: 2022-08-16 — End: 2022-08-16
  Administered 2022-08-16: 150 mg via INTRAVENOUS

## 2022-08-16 MED ORDER — MIDAZOLAM HCL 2 MG/2ML IJ SOLN
INTRAMUSCULAR | Status: AC
  Administered 2022-08-16: 2 mg via INTRAVENOUS
  Filled 2022-08-16: qty 2

## 2022-08-16 MED ORDER — CHLORHEXIDINE GLUCONATE CLOTH 2 % EX PADS
6.0000 | MEDICATED_PAD | Freq: Every day | CUTANEOUS | Status: DC
  Administered 2022-08-17 – 2022-08-21 (×5): 6 via TOPICAL

## 2022-08-16 MED ORDER — FAMOTIDINE 20 MG PO TABS
20.0000 mg | ORAL_TABLET | Freq: Two times a day (BID) | ORAL | Status: DC
  Administered 2022-08-16: 20 mg
  Filled 2022-08-16: qty 1

## 2022-08-16 MED ORDER — FENTANYL BOLUS VIA INFUSION
50.0000 ug | INTRAVENOUS | Status: DC | PRN
  Administered 2022-08-16 – 2022-08-17 (×2): 50 ug via INTRAVENOUS

## 2022-08-16 MED ORDER — FENTANYL CITRATE PF 50 MCG/ML IJ SOSY
PREFILLED_SYRINGE | INTRAMUSCULAR | Status: AC | PRN
  Administered 2022-08-16: 50 ug via INTRAVENOUS

## 2022-08-16 MED ORDER — ETOMIDATE 2 MG/ML IV SOLN
INTRAVENOUS | Status: AC | PRN
  Administered 2022-08-16: 20 mg via INTRAVENOUS

## 2022-08-16 MED ORDER — MIDAZOLAM HCL 2 MG/2ML IJ SOLN: 2.0000 mg | Freq: Once | INTRAMUSCULAR | Status: AC

## 2022-08-16 MED ORDER — SODIUM CHLORIDE 0.9 % IV SOLN
250.0000 mL | INTRAVENOUS | Status: DC
  Administered 2022-08-16: 250 mL via INTRAVENOUS

## 2022-08-16 MED ORDER — FENTANYL CITRATE PF 50 MCG/ML IJ SOSY: 50.0000 ug | PREFILLED_SYRINGE | INTRAMUSCULAR | Status: DC | PRN

## 2022-08-16 MED ORDER — INSULIN ASPART 100 UNIT/ML IJ SOLN
0.0000 [IU] | INTRAMUSCULAR | Status: DC
  Administered 2022-08-17: 1 [IU] via SUBCUTANEOUS
  Administered 2022-08-17: 2 [IU] via SUBCUTANEOUS
  Administered 2022-08-17 – 2022-08-21 (×6): 1 [IU] via SUBCUTANEOUS
  Filled 2022-08-16 (×8): qty 1

## 2022-08-16 MED ORDER — FENTANYL CITRATE PF 50 MCG/ML IJ SOSY
100.0000 ug | PREFILLED_SYRINGE | Freq: Once | INTRAMUSCULAR | Status: AC
  Administered 2022-08-16: 100 ug via INTRAVENOUS
  Filled 2022-08-16: qty 2

## 2022-08-16 MED ORDER — SUCCINYLCHOLINE CHLORIDE 200 MG/10ML IV SOSY
150.0000 mg | PREFILLED_SYRINGE | Freq: Once | INTRAVENOUS | Status: AC
  Administered 2022-08-16: 150 mg via INTRAVENOUS
  Filled 2022-08-16: qty 10

## 2022-08-16 MED ORDER — ETOMIDATE 2 MG/ML IV SOLN
20.0000 mg | Freq: Once | INTRAVENOUS | Status: AC
  Administered 2022-08-16: 20 mg via INTRAVENOUS
  Filled 2022-08-16: qty 10

## 2022-08-16 MED ORDER — KETAMINE HCL-SODIUM CHLORIDE 1000-0.9 MG/100ML-% IV SOLN: 0.5000 mg/kg/h | INTRAVENOUS | Status: DC

## 2022-08-16 MED ORDER — MAGNESIUM SULFATE 2 GM/50ML IV SOLN
2.0000 g | Freq: Once | INTRAVENOUS | Status: AC
  Administered 2022-08-16: 2 g via INTRAVENOUS
  Filled 2022-08-16: qty 50

## 2022-08-16 MED ORDER — FENTANYL 2500MCG IN NS 250ML (10MCG/ML) PREMIX INFUSION
50.0000 ug/h | INTRAVENOUS | Status: DC
  Administered 2022-08-16: 50 ug/h via INTRAVENOUS
  Filled 2022-08-16: qty 250

## 2022-08-16 MED ORDER — IPRATROPIUM-ALBUTEROL 0.5-2.5 (3) MG/3ML IN SOLN
3.0000 mL | Freq: Once | RESPIRATORY_TRACT | Status: AC
  Administered 2022-08-16: 3 mL via RESPIRATORY_TRACT
  Filled 2022-08-16: qty 3

## 2022-08-16 MED ORDER — NOREPINEPHRINE 4 MG/250ML-% IV SOLN
2.0000 ug/min | INTRAVENOUS | Status: DC
  Filled 2022-08-16: qty 250

## 2022-08-16 MED ORDER — KETAMINE HCL 50 MG/5ML IJ SOSY
1.0000 mg/kg | PREFILLED_SYRINGE | Freq: Once | INTRAMUSCULAR | Status: AC
  Administered 2022-08-16: 99 mg via INTRAVENOUS
  Filled 2022-08-16: qty 10

## 2022-08-16 MED ORDER — SODIUM CHLORIDE 0.9 % IV BOLUS
1000.0000 mL | Freq: Once | INTRAVENOUS | Status: AC
  Administered 2022-08-16: 1000 mL via INTRAVENOUS

## 2022-08-16 MED ORDER — METHYLPREDNISOLONE SODIUM SUCC 125 MG IJ SOLR
125.0000 mg | Freq: Once | INTRAMUSCULAR | Status: AC
  Administered 2022-08-16: 125 mg via INTRAVENOUS
  Filled 2022-08-16: qty 2

## 2022-08-16 MED ORDER — MIDAZOLAM HCL 2 MG/2ML IJ SOLN: 1.0000 mg | INTRAMUSCULAR | Status: DC | PRN

## 2022-08-16 MED ORDER — POLYETHYLENE GLYCOL 3350 17 G PO PACK: 17.0000 g | PACK | Freq: Every day | ORAL | Status: DC | PRN

## 2022-08-16 MED ORDER — FENTANYL CITRATE PF 50 MCG/ML IJ SOSY
50.0000 ug | PREFILLED_SYRINGE | INTRAMUSCULAR | Status: DC | PRN
  Filled 2022-08-16 (×3): qty 1

## 2022-08-16 MED ORDER — PROPOFOL 1000 MG/100ML IV EMUL
0.0000 ug/kg/min | INTRAVENOUS | Status: DC
  Administered 2022-08-16: 5 ug/kg/min via INTRAVENOUS
  Filled 2022-08-16: qty 100

## 2022-08-16 MED ORDER — DOCUSATE SODIUM 100 MG PO CAPS: 100.0000 mg | ORAL_CAPSULE | Freq: Two times a day (BID) | ORAL | Status: DC | PRN

## 2022-08-16 MED ORDER — FENTANYL CITRATE (PF) 100 MCG/2ML IJ SOLN
INTRAMUSCULAR | Status: AC | PRN
Start: 2022-08-16 — End: 2022-08-16
  Administered 2022-08-16: 50 ug via INTRAVENOUS

## 2022-08-16 MED ORDER — FENTANYL CITRATE PF 50 MCG/ML IJ SOSY
50.0000 ug | PREFILLED_SYRINGE | Freq: Once | INTRAMUSCULAR | Status: AC
  Administered 2022-08-16: 50 ug via INTRAVENOUS

## 2022-08-16 NOTE — ED Notes (Signed)
Pt getting more and more agitated and confused. Pt not making sense when talking and getting sleepy. Pt pulling at lines and cords. MD and Charge RN notified.

## 2022-08-16 NOTE — ED Notes (Addendum)
ED Provider at bedside. 

## 2022-08-16 NOTE — H&P (Addendum)
NAME:  Terri Casey, MRN:  [id number], DOB:  [birth year], LOS: 0 ADMISSION DATE:  08/16/2022, CONSULTATION DATE:  08/16/22 REFERRING MD:  Dr. Erma Heritage, CHIEF COMPLAINT: Shortness of Breath    History of Present Illness:  62 yo F presenting to [facility] ED on 08/16/22 for evaluation of worsening dyspnea.  History obtained via chart review and telephone report from daughter-in-law, [name]. Daughter-in-law describes the patient as being in her baseline state of health until 08/13/2022.  The patient has had progressive shortness of breath and poor p.o. intake.  On the morning of 08/16/2022 she seemed more dyspneic and lethargic, she described her lips as "changing color".  They increased her nasal cannula oxygen to 5 L from 3-4, and the patient appeared to recover.  Later in the evening she had a similar episode becoming increasingly dyspneic, lethargic with changes to her coloration.  Daughter-in-law called EMS when she noted her SpO2 to be in the 70's. The patient lives with her daughter-in-law and son, they both feel the patient would benefit from being in a skilled nursing facility.  At first [name] reported the patient " does not take her medication the way she supposed to".  However after further questions, the patient is consistently taking her medication but not with food or during the exact times of dates prescribed.  [name] confirmed she has been taking her Xarelto and her inhalers.  She has been using her rescue inhaler about 3-4 times a day additionally.  [name] denied any complaints of fever/chills, nausea/vomiting/diarrhea/abdominal pain, falls/loss of consciousness, or new productive cough.  She also confirmed the patient has not been around anyone ill. [name] describes a recent yeast infection diagnosis the patient was prescribed fluconazole, completing all but the last 2 doses. She reports current everyday smoking, 2-3 cigarettes daily for the last 40+ years. She denies any regular EtOH or  recreational drug use.  She confirms that her outpatient regimen for anxiety has been working well.  ED course: Upon arrival patient was lethargic placed on BIPAP support, patient had mild tachypnea but otherwise vital signs stable on 40% FiO2.  At first the patient appeared to improve on BiPAP support but then became increasingly agitated and confused prompting urgent intubation and mechanical ventilatory support.  Labs significant for respiratory acidosis and elevated MCV with mild thrombocytopenia.  (At baseline the patient has macrocytic anemia) Medications given: Duo neb x 3, solu medrol 125 mg, 2 g Mg, fentanyl/ succinylcholine/ etomidate, 1 L NS bolus, propofol drip Initial Vitals: 97.8, 21, 77, 119/76, 97% on BIPAP @ 40% Significant labs: (Labs/ Imaging personally reviewed) I, Cheryll Cockayne Rust-Chester, AGACNP-BC, personally viewed and interpreted this ECG. EKG Interpretation: Date: 08/16/2022, EKG Time: 23:19, Rate: 65, Rhythm: NSR, QRS Axis: Borderline RAD, Intervals: Normal, ST/T Wave abnormalities: Mild inferior STE, Narrative Interpretation: NSR with borderline RAD and mild inferior ST that does not meet STEMI criteria Chemistry: Na+: 143, K+: 4.3, BUN/Cr.: 10/0.67, Serum CO2/ AG: 41/ 5, Cl: 97 Hematology: WBC: 4.8, Hgb: 12.8, MCV: 115, Plt: 125  Troponin: 13 > 14, BNP: 53, PCT: <0.10, COVID-19: negative VBG: 7.14/ >123/ <31/ 47  CXR 08/16/22: Mild Lingular scarring/atelectasis, no other cardiopulmonary abnormalities. CT head wo contrast 08/16/22: No acute intracranial abnormality  PCCM consulted for admission due to acute on chronic hypoxic/hypercapnic respiratory failure secondary to AECOPD requiring urgent intubation and mechanical ventilatory support.  Pertinent  Medical History  COPD T2DM HTN HFpEF Hypothyroidism PAF Anxiety Tobacco Abuse Asthma OSA Significant Hospital Events: Including procedures, antibiotic start and  stop dates in addition to other pertinent events    08/16/22: Admit to ICU with acute on chronic hypoxic/hypercapnic respiratory failure s/t AECOPD, failed BIPAP and intubated in ED requiring mechanical ventilatory support.  Interim History / Subjective:  Patient intubated and sedated.  Spoke with daughter-in-law [name] over the phone who confirmed CODE STATUS and answered history questions.  Plan of care discussed all questions and concerns answered at this time.  Objective   Blood pressure 119/76, pulse 77, temperature 97.8 F (36.6 C), resp. rate (!) 21, height 5\' 9"  (1.753 m), weight 98.9 kg, SpO2 97%.    FiO2 (%):  [35 %-40 %] 40 %   Intake/Output Summary (Last 24 hours) at 08/16/2022 2128 Last data filed at 08/16/2022 2053 Gross per 24 hour  Intake 50 ml  Output --  Net 50 ml   Filed Weights   08/16/22 2100 08/16/22 2120  Weight: 98.9 kg 98.9 kg    Examination: General: Adult female, critically ill, lying in bed intubated & sedated requiring mechanical ventilation, NAD HEENT: MM pink/moist, anicteric, atraumatic, neck supple Neuro: Sedated, unable to follow commands, PERRL +3, MAE CV: s1s2 RRR, NSR on monitor, no r/m/g Pulm: Regular, non labored on PRVC 40%, breath sounds diminished throughout GI: soft, rounded, bs x 4 GU: foley in place with cloudy amber urine Skin:  no rashes/lesions noted Extremities: warm/dry, pulses + 2 R/P, no edema noted  Resolved Hospital Problem list     Assessment & Plan:  Acute on Chronic Hypoxic / Hypercapnic Respiratory Failure suspect secondary to AECOPD  PMHx: COPD, asthma, tobacco abuse - Ventilator settings: PRVC  8 mL/kg, 40% FiO2, 5 PEEP, continue ventilator support & lung protective strategies - Wean PEEP & FiO2 as tolerated, maintain SpO2 > 90% - Head of bed elevated 30 degrees, VAP protocol in place - Plateau pressures less than 30 cm H20  - Intermittent chest x-ray & ABG PRN - Daily WUA with SBT as tolerated  - Ensure adequate pulmonary hygiene  - F/u cultures, trend PCT -  Azithromycin & Rocephin > follow PCT trend, consider discontinuing rocephin - Steroids initiated: solu-medrol 40 mg BID  - Budesonide nebs BID, Duo Nebs Q 6, bronchodilators PRN - PAD protocol in place: continue Fentanyl drip & Propofol drip > consider Ketamine if patient is difficult to sedate -Smoking cessation counseling upon extubation  Chronic / Paroxysmal Atrial Fibrillation  Chronic HFpEF without exacerbation Hyperlipidemia - continue outpatient Xarelto & Lipitor - diurese with lasix as hemodynamics and renal function will allow - Continuous cardiac monitoring  Hypothyroidism -Continue outpatient Synthroid  Anxiety and depression -Continue outpatient Lexapro -Consider restarting hydroxyzine once patient is extubated  Yeast infection -PTA -Continue last 2 doses of fluconazole  Type 2 Diabetes Mellitus Risk for Steroid Induced Hyperglycemia Hemoglobin A1C: Pending - Monitor CBG Q 4 hours - SSI sensitive dosing > consider increasing range PRN with ongoing steroid use - target range while in ICU: 140-180 - follow ICU hyper/hypo-glycemia protocol  Best Practice (right click and "Reselect all SmartList Selections" daily)  Diet/type: NPO w/ meds via tube DVT prophylaxis: DOAC GI prophylaxis: H2B Lines: N/A Foley:  Yes, and it is still needed Code Status:  full code Last date of multidisciplinary goals of care discussion [08/16/22]  Labs   CBC: Recent Labs  Lab 08/16/22 2017  WBC 4.8  NEUTROABS 3.6  HGB 12.8  HCT 45.9  MCV 115.0*  PLT 125*    Basic Metabolic Panel: Recent Labs  Lab 08/16/22 2017  NA 143  K 4.3  CL 97*  CO2 41*  GLUCOSE 118*  BUN 10  CREATININE 0.67  CALCIUM 9.2   GFR: Estimated Creatinine Clearance: 90.1 mL/min (by C-G formula based on SCr of 0.67 mg/dL). Recent Labs  Lab 08/16/22 2017  WBC 4.8    Liver Function Tests: Recent Labs  Lab 08/16/22 2017  AST 15  ALT 11  ALKPHOS 95  BILITOT 0.5  PROT 7.2  ALBUMIN 4.0   No  results for input(s): "LIPASE", "AMYLASE" in the last 168 hours. No results for input(s): "AMMONIA" in the last 168 hours.  ABG    Component Value Date/Time   PHART 7.39 12/16/2021 0946   PCO2ART 67 (HH) 12/16/2021 0946   PO2ART 62 (L) 12/16/2021 0946   HCO3 47.0 (H) 08/16/2022 2010   TCO2 34 (H) 09/12/2021 1634   ACIDBASEDEF 7.7 (H) 08/09/2019 0748   O2SAT 38.6 08/16/2022 2010     Coagulation Profile: No results for input(s): "INR", "PROTIME" in the last 168 hours.  Cardiac Enzymes: No results for input(s): "CKTOTAL", "CKMB", "CKMBINDEX", "TROPONINI" in the last 168 hours.  HbA1C: Hgb A1c MFr Bld  Date/Time Value Ref Range Status  09/11/2021 05:38 PM 4.9 4.8 - 5.6 % Final    Comment:    (NOTE) Pre diabetes:          5.7%-6.4%  Diabetes:              >6.4%  Glycemic control for   <7.0% adults with diabetes   09/11/2021 04:46 AM 4.9 4.8 - 5.6 % Final    Comment:    (NOTE) Pre diabetes:          5.7%-6.4%  Diabetes:              >6.4%  Glycemic control for   <7.0% adults with diabetes     CBG: No results for input(s): "GLUCAP" in the last 168 hours.  Review of Systems:   UTA- patient intubated and sedated.  Past Medical History:  She,  has a past medical history of Anxiety, Asthma, CHF (congestive heart failure) (HCC), COPD (chronic obstructive pulmonary disease) (HCC), Diabetes mellitus without complication (HCC), Hypertension, and Thyroid disease.   Surgical History:   Past Surgical History:  Procedure Laterality Date   ABDOMINAL HYSTERECTOMY     THORACENTESIS Left 09/18/2021   Procedure: THORACENTESIS;  Surgeon: Lupita Leash, MD;  Location: [facility] ENDOSCOPY;  Service: Cardiopulmonary;  Laterality: Left;     Social History:   reports that she has been smoking cigarettes. She has a 10 pack-year smoking history. She has never used smokeless tobacco. She reports that she does not drink alcohol and does not use drugs.   Family History:  Her family  history is not on file.   Allergies Allergies  Allergen Reactions   Estrogens Hives   Mustard Hives   Strawberry Extract Hives     Home Medications  Prior to Admission medications   Medication Sig Start Date End Date Taking? Authorizing Provider  ADVAIR DISKUS 500-50 MCG/ACT AEPB Inhale 1 puff into the lungs in the morning and at bedtime. 12/04/21   [provider]  albuterol (PROAIR HFA) 108 (90 Base) MCG/ACT inhaler 2 puffs every 4 hours as needed only  if your can't catch your breath 03/21/21   Laural Benes, Clanford L, MD  albuterol (PROVENTIL) (2.5 MG/3ML) 0.083% nebulizer solution Take 3 mLs (2.5 mg total) by nebulization every 6 (six) hours as needed for wheezing or shortness of breath. 03/21/21  Johnson, Clanford L, MD  atorvastatin (LIPITOR) 20 MG tablet Take 20 mg by mouth at bedtime.     [provider]  bethanechol (URECHOLINE) 5 MG tablet Take 5 mg by mouth 2 (two) times daily. 08/06/19   [provider]  escitalopram (LEXAPRO) 20 MG tablet Take 20 mg by mouth daily. 10/25/21   [provider]  folic acid (FOLVITE) 1 MG tablet Take 1 tablet (1 mg total) by mouth daily. 10/11/19   Catarina Hartshorn, MD  furosemide (LASIX) 40 MG tablet Take 1 tablet (40 mg total) by mouth daily. 10/11/19   Catarina Hartshorn, MD  hydrocortisone cream 1 % Apply topically 2 (two) times daily. 09/25/21   Pokhrel, Rebekah Chesterfield, MD  hydrOXYzine (ATARAX) 10 MG tablet Take 1 tablet (10 mg total) by mouth 3 (three) times daily. 12/18/21   Enedina Finner, MD  levothyroxine (SYNTHROID) 200 MCG tablet Take 1 tablet (200 mcg total) by mouth daily at 6 (six) AM. 06/20/19   Swayze, Ava, DO  oxyCODONE-acetaminophen (PERCOCET) 10-325 MG tablet Take 1 tablet by mouth 4 (four) times daily. 09/06/21   [provider]  pantoprazole (PROTONIX) 40 MG tablet Take 1 tablet (40 mg total) by mouth daily. 06/20/19   Swayze, Ava, DO  STIOLTO RESPIMAT 2.5-2.5 MCG/ACT AERS Inhale 2 each into the lungs daily. 12/04/21    [provider]  SUMAtriptan (IMITREX) 50 MG tablet Take 50 mg by mouth every 2 (two) hours as needed for migraine. 07/31/21   [provider]  topiramate (TOPAMAX) 100 MG tablet Take 100 mg by mouth 2 (two) times daily. 06/01/20   [provider]  Vitamin D, Ergocalciferol, (DRISDOL) 1.25 MG (50000 UNIT) CAPS capsule Take 50,000 Units by mouth once a week. 10/02/21   [provider]  XARELTO 20 MG TABS tablet Take 20 mg by mouth daily. 05/08/19   [provider]     Critical care time: 68 minutes       Betsey Holiday, AGACNP-BC Acute Care Nurse Practitioner [facility]   [phone] / [phone] Please see Amion for details.

## 2022-08-16 NOTE — ED Notes (Signed)
ED Provider at bedside discussing poc with family and pt.

## 2022-08-16 NOTE — Progress Notes (Signed)
eLink Physician-Brief Progress Note Patient Name: Terri Casey DOB: May 07, 1960 MRN: [id number]   Date of Service  08/16/2022  HPI/Events of Note  Patient admitted with altered mental status and acute hypercapnic respiratory failure s/p intubation and mechanical ventilation.  eICU Interventions  New Patient Evaluation.        Thomasene Lot  08/16/2022, 11:19 PM

## 2022-08-16 NOTE — ED Triage Notes (Signed)
Pt arrived via EMS at this time with hypoxia in the 70s at EMS arrival to scene. Placed her on bipap on arrival and pt is 100%. Normally uses 4-5L o2 [place] at baseline. Somewhat altered as well initially

## 2022-08-16 NOTE — ED Notes (Signed)
Per MD plan for intubation. RT notified.

## 2022-08-16 NOTE — ED Provider Notes (Signed)
[facility] Provider Note    Event Date/Time   First MD Initiated Contact with Patient 08/16/22 1957     (approximate)   History   Shortness of Breath   HPI  Terri Casey is a 62 y.o. female here with shortness of breath.  History provided primarily by family and EMS.  Per report, the patient's had increasing shortness of breath over the last 4 days.  She has not extensive history of COPD with recurrent hypercapnic respiratory failure.  She does not always use her breathing treatments and home medications as indicated.  She has been increasingly drowsy throughout the day today.  Remainder of history limited due to drowsiness.  Per EMS report, patient was satting in the 70s on arrival on her home 5 L.  She was severely drowsy which has slightly improved since her arrival.     Physical Exam   Triage Vital Signs: ED Triage Vitals  Encounter Vitals Group     BP      Systolic BP Percentile      Diastolic BP Percentile      Pulse      Resp      Temp      Temp src      SpO2      Weight      Height      Head Circumference      Peak Flow      Pain Score      Pain Loc      Pain Education      Exclude from Growth Chart     Most recent vital signs: Vitals:   08/17/22 0000 08/17/22 0015  BP: 93/61 91/60  Pulse: 64 64  Resp: 20 20  Temp: 98.8 F (37.1 C) 98.8 F (37.1 C)  SpO2: 99% 96%     General: Drowsy, arousable to pain.  Tachypneic. CV:  Good peripheral perfusion.  Tachycardia. Resp:  Increased work of breathing with diminished aeration and bilateral wheezing. Abd:  No distention.  No tenderness. Other:  Disoriented, responds to painful stimuli only.  No obvious focal deficits.  No apparent trauma.   ED Results / Procedures / Treatments   Labs (all labs ordered are listed, but only abnormal results are displayed) Labs Reviewed  BLOOD GAS, VENOUS - Abnormal; Notable for the following components:      Result Value   pH, Ven 7.14  (*)    pCO2, Ven >123 (*)    pO2, Ven <31 (*)    Bicarbonate 47.0 (*)    Acid-Base Excess 12.3 (*)    All other components within normal limits  CBC WITH DIFFERENTIAL/PLATELET - Abnormal; Notable for the following components:   MCV 115.0 (*)    MCHC 27.9 (*)    Platelets 125 (*)    All other components within normal limits  COMPREHENSIVE METABOLIC PANEL - Abnormal; Notable for the following components:   Chloride 97 (*)    CO2 41 (*)    Glucose, Bld 118 (*)    All other components within normal limits  URINALYSIS, ROUTINE W REFLEX MICROSCOPIC - Abnormal; Notable for the following components:   Color, Urine AMBER (*)    APPearance CLOUDY (*)    Hgb urine dipstick SMALL (*)    Protein, ur 100 (*)    Bacteria, UA RARE (*)    All other components within normal limits  GLUCOSE, CAPILLARY - Abnormal; Notable for the following components:   Glucose-Capillary 109 (*)  All other components within normal limits  BLOOD GAS, ARTERIAL - Abnormal; Notable for the following components:   pCO2 arterial 54 (*)    pO2, Arterial 69 (*)    Bicarbonate 35.8 (*)    Acid-Base Excess 9.6 (*)    All other components within normal limits  SARS CORONAVIRUS 2 BY RT PCR  MRSA NEXT GEN BY PCR, NASAL  CULTURE, BLOOD (SINGLE)  RESPIRATORY PANEL BY PCR  BRAIN NATRIURETIC PEPTIDE  URINE DRUG SCREEN, QUALITATIVE ([facility] ONLY)  PROCALCITONIN  TRIGLYCERIDES  CBC  BASIC METABOLIC PANEL  MAGNESIUM  PHOSPHORUS  HEMOGLOBIN A1C  HIV ANTIBODY (ROUTINE TESTING W REFLEX)  PROCALCITONIN  TROPONIN I (HIGH SENSITIVITY)  TROPONIN I (HIGH SENSITIVITY)     EKG Normal sinus rhythm, ventricular rate 65.  PR 171, QRS 84, QTc 387.  No acute ST elevations or depression.  Baseline artifact raises question of ST elevation although on rhythm strips this is not seen.   RADIOLOGY Chest x-ray: Clear   I also independently reviewed and agree with radiologist interpretations.   PROCEDURES:  Critical Care performed:  Yes, see critical care procedure note(s)  .Critical Care  Performed by: Shaune Pollack, MD Authorized by: Shaune Pollack, MD   Critical care provider statement:    Critical care time (minutes):  30   Critical care time was exclusive of:  Separately billable procedures and treating other patients   Critical care was necessary to treat or prevent imminent or life-threatening deterioration of the following conditions:  Cardiac failure, circulatory failure and respiratory failure   Critical care was time spent personally by me on the following activities:  Development of treatment plan with patient or surrogate, discussions with consultants, evaluation of patient's response to treatment, examination of patient, ordering and review of laboratory studies, ordering and review of radiographic studies, ordering and performing treatments and interventions, pulse oximetry, re-evaluation of patient's condition and review of old charts   I assumed direction of critical care for this patient from another provider in my specialty: no     Care discussed with: admitting provider   Procedure Name: Intubation Date/Time: 08/17/2022 2:01 AM  Performed by: Shaune Pollack, MDPre-anesthesia Checklist: Patient identified, Patient being monitored, Emergency Drugs available, Timeout performed and Suction available Oxygen Delivery Method: Non-rebreather mask Preoxygenation: Pre-oxygenation with 100% oxygen Induction Type: Rapid sequence Ventilation: Mask ventilation without difficulty Laryngoscope Size: Glidescope and 4 Grade View: Grade I Tube size: 7.5 mm Number of attempts: 1 Airway Equipment and Method: Rigid stylet and Video-laryngoscopy Placement Confirmation: ETT inserted through vocal cords under direct vision, CO2 detector and Breath sounds checked- equal and bilateral Secured at: 23 cm Tube secured with: ETT holder Dental Injury: Teeth and Oropharynx as per pre-operative assessment  Difficulty Due To:  Difficulty was unanticipated Future Recommendations: Recommend- induction with short-acting agent, and alternative techniques readily available        MEDICATIONS ORDERED IN ED: Medications  propofol (DIPRIVAN) 1000 MG/100ML infusion (20 mcg/kg/min  98.9 kg Intravenous Rate/Dose Change 08/16/22 2245)  fentaNYL in NS (42mcg/ml) infusion-PREMIX (50 mcg/hr Intravenous New Bag/Given 08/16/22 2155)  fentaNYL (SUBLIMAZE) bolus via infusion 50-100 mcg (50 mcg Intravenous Bolus from Bag 08/16/22 2250)  norepinephrine (LEVOPHED) 4mg  in (0.016 mg/mL) premix infusion ( Intravenous Not Given 08/16/22 2250)  docusate sodium (COLACE) capsule 100 mg (has no administration in time range)  polyethylene glycol (MIRALAX / GLYCOLAX) packet 17 g (has no administration in time range)  famotidine (PEPCID) tablet 20 mg (20 mg Per Tube Given  08/16/22 2347)  insulin aspart (novoLOG) injection 0-9 Units ( Subcutaneous Not Given 08/16/22 2338)  ketamine (KETALAR) adult IV infusion 1000mg /137mL (10 mg/mL-Premix) (has no administration in time range)  0.9 %  sodium chloride infusion (250 mLs Intravenous New Bag/Given 08/16/22 2343)  midazolam (VERSED) injection 1-2 mg (has no administration in time range)  Chlorhexidine Gluconate Cloth 2 % PADS 6 each (has no administration in time range)  azithromycin (ZITHROMAX) 500 mg in sodium chloride 0.9 % 250 mL IVPB (500 mg Intravenous New Bag/Given 08/17/22 0139)  methylPREDNISolone sodium succinate (SOLU-MEDROL) 40 mg/mL injection 40 mg (has no administration in time range)  ipratropium-albuterol (DUONEB) 0.5-2.5 (3) MG/3ML nebulizer solution 3 mL (3 mLs Nebulization Given 08/17/22 0147)  ipratropium-albuterol (DUONEB) 0.5-2.5 (3) MG/3ML nebulizer solution 3 mL (has no administration in time range)  budesonide (PULMICORT) nebulizer solution 0.25 mg (has no administration in time range)  atorvastatin (LIPITOR) tablet 20 mg (20 mg Per Tube Given 08/17/22 0046)   escitalopram (LEXAPRO) tablet 20 mg (has no administration in time range)  folic acid (FOLVITE) tablet 1 mg (has no administration in time range)  levothyroxine (SYNTHROID) tablet 200 mcg (has no administration in time range)  rivaroxaban (XARELTO) tablet 20 mg (has no administration in time range)  cefTRIAXone (ROCEPHIN) 2 g in sodium chloride 0.9 % 100 mL IVPB (2 g Intravenous New Bag/Given 08/17/22 0055)  fluconazole (DIFLUCAN) tablet 100 mg (has no administration in time range)  methylPREDNISolone sodium succinate (SOLU-MEDROL) 125 mg/2 mL injection 125 mg (125 mg Intravenous Given 08/16/22 2036)  ipratropium-albuterol (DUONEB) 0.5-2.5 (3) MG/3ML nebulizer solution 3 mL (3 mLs Nebulization Given 08/16/22 2018)  ipratropium-albuterol (DUONEB) 0.5-2.5 (3) MG/3ML nebulizer solution 3 mL (3 mLs Nebulization Given 08/16/22 2018)  ipratropium-albuterol (DUONEB) 0.5-2.5 (3) MG/3ML nebulizer solution 3 mL (3 mLs Nebulization Given 08/16/22 2018)  magnesium sulfate IVPB 2 g 50 mL (0 g Intravenous Stopped 08/16/22 2053)  etomidate (AMIDATE) injection 20 mg (20 mg Intravenous Given 08/16/22 2132)  succinylcholine (ANECTINE) syringe 150 mg (150 mg Intravenous Given 08/16/22 2132)  etomidate (AMIDATE) injection (20 mg Intravenous Given 08/16/22 2132)  succinylcholine (ANECTINE) injection (150 mg Intravenous Given 08/16/22 2133)  fentaNYL (SUBLIMAZE) injection (50 mcg Intravenous Given 08/16/22 2136)  sodium chloride 0.9 % bolus 1,000 mL (1,000 mLs Intravenous New Bag/Given 08/16/22 2142)  fentaNYL (SUBLIMAZE) injection (50 mcg Intravenous Given 08/16/22 2138)  fentaNYL (SUBLIMAZE) injection 100 mcg (100 mcg Intravenous Given 08/16/22 2148)  midazolam (VERSED) injection 2 mg (2 mg Intravenous Given 08/16/22 2149)  ketamine 50 mg in normal saline 5 mL (10 mg/mL) syringe (99 mg Intravenous Given 08/16/22 2155)  fentaNYL (SUBLIMAZE) injection 50 mcg (50 mcg Intravenous Given 08/16/22 2150)     IMPRESSION / MDM /  ASSESSMENT AND PLAN / ED COURSE  I reviewed the triage vital signs and the nursing notes.                              Differential diagnosis includes, but is not limited to, COPD exacerbation, PNA, COVID-19, polypharmacy, allergic rxn, FB  Patient's presentation is most consistent with acute presentation with potential threat to life or bodily function.  The patient is on the cardiac monitor to evaluate for evidence of arrhythmia and/or significant heart rate changes  62 yo F with extensive PMHx of COPD with ICU admissions here with recurrent SOB, resp failure. Pt initially drowsy on arrival but had transient improvement on BIPAP, despite significant resp acidosis on  initial VBG. However, pt then became increasingly agitated, uncooperative with BIPAP. Decision made to intubate - family at bedside and in agreement. Intubated as above, tolerated well. Will admit to ICU. CXR clear. Labs o/w overall reassuring. EKG nonischemic. Suspect acute on chronic hypercapneic resp failure 2/2 COPD. Pt given IV steroids, mag, duonebs.   FINAL CLINICAL IMPRESSION(S) / ED DIAGNOSES   Final diagnoses:  Acute respiratory failure with hypercapnia (HCC)  COPD exacerbation (HCC)     Rx / DC Orders   ED Discharge Orders     None        Note:  This document was prepared using Dragon voice recognition software and may include unintentional dictation errors.   Shaune Pollack, MD 08/17/22 [phone]

## 2022-08-16 NOTE — ED Notes (Signed)
RT at bedside.

## 2022-08-16 NOTE — ED Notes (Signed)
Going to CT then floor.

## 2022-08-16 NOTE — ED Notes (Signed)
Preparing for intubation

## 2022-08-16 NOTE — ED Notes (Addendum)
RT called at this time to assist in transporting pt. RN made aware.

## 2022-08-17 ENCOUNTER — Inpatient Hospital Stay: Payer: Medicaid Other

## 2022-08-17 DIAGNOSIS — J9621 Acute and chronic respiratory failure with hypoxia: Secondary | ICD-10-CM | POA: Diagnosis not present

## 2022-08-17 DIAGNOSIS — J441 Chronic obstructive pulmonary disease with (acute) exacerbation: Secondary | ICD-10-CM | POA: Diagnosis not present

## 2022-08-17 DIAGNOSIS — J9622 Acute and chronic respiratory failure with hypercapnia: Secondary | ICD-10-CM | POA: Diagnosis not present

## 2022-08-17 LAB — BLOOD GAS, ARTERIAL
Acid-Base Excess: 9.6 mmol/L — ABNORMAL HIGH (ref 0.0–2.0)
Bicarbonate: 35.8 mmol/L — ABNORMAL HIGH (ref 20.0–28.0)
FIO2: 40 %
MECHVT: 450 mL
Mechanical Rate: 20
O2 Saturation: 98.2 %
PEEP: 5 cmH2O
Patient temperature: 37
pCO2 arterial: 54 mmHg — ABNORMAL HIGH (ref 32–48)
pH, Arterial: 7.43 (ref 7.35–7.45)
pO2, Arterial: 69 mmHg — ABNORMAL LOW (ref 83–108)

## 2022-08-17 LAB — RESPIRATORY PANEL BY PCR

## 2022-08-17 LAB — BLOOD GAS, VENOUS
Acid-Base Excess: 15 mmol/L — ABNORMAL HIGH (ref 0.0–2.0)
Acid-Base Excess: 12.5 mmol/L — ABNORMAL HIGH (ref 0.0–2.0)
Bicarbonate: 42.5 mmol/L — ABNORMAL HIGH (ref 20.0–28.0)
Bicarbonate: 38.9 mmol/L — ABNORMAL HIGH (ref 20.0–28.0)
O2 Saturation: 94 %
O2 Saturation: 97.1 %
Patient temperature: 37
Patient temperature: 37
pCO2, Ven: 64 mmHg — ABNORMAL HIGH (ref 44–60)
pCO2, Ven: 56 mmHg (ref 44–60)
pH, Ven: 7.43 (ref 7.25–7.43)
pH, Ven: 7.45 — ABNORMAL HIGH (ref 7.25–7.43)
pO2, Ven: 62 mmHg — ABNORMAL HIGH (ref 32–45)
pO2, Ven: 69 mmHg — ABNORMAL HIGH (ref 32–45)

## 2022-08-17 LAB — GLUCOSE, CAPILLARY
Glucose-Capillary: 111 mg/dL — ABNORMAL HIGH (ref 70–99)
Glucose-Capillary: 118 mg/dL — ABNORMAL HIGH (ref 70–99)
Glucose-Capillary: 119 mg/dL — ABNORMAL HIGH (ref 70–99)
Glucose-Capillary: 145 mg/dL — ABNORMAL HIGH (ref 70–99)
Glucose-Capillary: 153 mg/dL — ABNORMAL HIGH (ref 70–99)

## 2022-08-17 LAB — BASIC METABOLIC PANEL
Anion gap: 9 (ref 5–15)
BUN: 12 mg/dL (ref 8–23)
CO2: 33 mmol/L — ABNORMAL HIGH (ref 22–32)
Calcium: 9 mg/dL (ref 8.9–10.3)
Chloride: 100 mmol/L (ref 98–111)
Creatinine, Ser: 0.75 mg/dL (ref 0.44–1.00)
GFR, Estimated: >60 mL/min (ref >60)
Glucose, Bld: 110 mg/dL — ABNORMAL HIGH (ref 70–99)
Potassium: 4.4 mmol/L (ref 3.5–5.1)
Sodium: 142 mmol/L (ref 135–145)

## 2022-08-17 LAB — PROCALCITONIN
Procalcitonin: <0.1 ng/mL
Procalcitonin: <0.1 ng/mL

## 2022-08-17 LAB — CBC
HCT: 36.5 % (ref 36.0–46.0)
Hemoglobin: 10.7 g/dL — ABNORMAL LOW (ref 12.0–15.0)
MCH: 31.8 pg (ref 26.0–34.0)
MCHC: 29.3 g/dL — ABNORMAL LOW (ref 30.0–36.0)
MCV: 108.6 fL — ABNORMAL HIGH (ref 80.0–100.0)
Platelets: 122 10*3/uL — ABNORMAL LOW (ref 150–400)
RBC: 3.36 MIL/uL — ABNORMAL LOW (ref 3.87–5.11)
RDW: 13.2 % (ref 11.5–15.5)
WBC: 4.4 10*3/uL (ref 4.0–10.5)
nRBC: 0 % (ref 0.0–0.2)

## 2022-08-17 LAB — HIV ANTIBODY (ROUTINE TESTING W REFLEX): HIV Screen 4th Generation wRfx: NONREACTIVE

## 2022-08-17 LAB — TRIGLYCERIDES: Triglycerides: 56 mg/dL (ref <150)

## 2022-08-17 LAB — MRSA NEXT GEN BY PCR, NASAL: MRSA by PCR Next Gen: NOT DETECTED

## 2022-08-17 LAB — HEMOGLOBIN A1C
Hgb A1c MFr Bld: 4.9 % (ref 4.8–5.6)
Mean Plasma Glucose: 93.93 mg/dL

## 2022-08-17 LAB — MAGNESIUM: Magnesium: 2.5 mg/dL — ABNORMAL HIGH (ref 1.7–2.4)

## 2022-08-17 LAB — PHOSPHORUS: Phosphorus: 2.1 mg/dL — ABNORMAL LOW (ref 2.5–4.6)

## 2022-08-17 MED ORDER — LEVOTHYROXINE SODIUM 100 MCG PO TABS
200.0000 ug | ORAL_TABLET | Freq: Every day | ORAL | Status: DC
  Administered 2022-08-17 – 2022-08-18 (×2): 200 ug
  Filled 2022-08-17 (×2): qty 2

## 2022-08-17 MED ORDER — PANTOPRAZOLE SODIUM 40 MG IV SOLR
40.0000 mg | INTRAVENOUS | Status: DC
  Administered 2022-08-17: 40 mg via INTRAVENOUS
  Filled 2022-08-17: qty 10

## 2022-08-17 MED ORDER — IPRATROPIUM-ALBUTEROL 0.5-2.5 (3) MG/3ML IN SOLN
3.0000 mL | Freq: Four times a day (QID) | RESPIRATORY_TRACT | Status: DC
  Administered 2022-08-17 – 2022-08-21 (×17): 3 mL via RESPIRATORY_TRACT
  Filled 2022-08-17 (×18): qty 3

## 2022-08-17 MED ORDER — FENTANYL CITRATE PF 50 MCG/ML IJ SOSY
50.0000 ug | PREFILLED_SYRINGE | INTRAMUSCULAR | Status: DC | PRN
  Administered 2022-08-17 (×2): 50 ug via INTRAVENOUS
  Administered 2022-08-17: 100 ug via INTRAVENOUS
  Administered 2022-08-17: 50 ug via INTRAVENOUS
  Administered 2022-08-18 (×3): 100 ug via INTRAVENOUS
  Filled 2022-08-17: qty 2
  Filled 2022-08-17 (×2): qty 1
  Filled 2022-08-17 (×3): qty 2
  Filled 2022-08-17: qty 1

## 2022-08-17 MED ORDER — SODIUM CHLORIDE 0.9 % IV SOLN
500.0000 mg | INTRAVENOUS | Status: AC
  Administered 2022-08-17 – 2022-08-21 (×5): 500 mg via INTRAVENOUS
  Filled 2022-08-17 (×5): qty 5

## 2022-08-17 MED ORDER — POTASSIUM & SODIUM PHOSPHATES 280-160-250 MG PO PACK
2.0000 | PACK | Freq: Three times a day (TID) | ORAL | Status: AC
  Administered 2022-08-17: 2
  Filled 2022-08-17: qty 2

## 2022-08-17 MED ORDER — SODIUM CHLORIDE 0.9 % IV SOLN
2.0000 g | INTRAVENOUS | Status: AC
  Administered 2022-08-17 – 2022-08-21 (×5): 2 g via INTRAVENOUS
  Filled 2022-08-17 (×5): qty 20

## 2022-08-17 MED ORDER — VITAL AF 1.2 CAL PO LIQD
1000.0000 mL | ORAL | Status: DC
  Administered 2022-08-17 – 2022-08-18 (×2): 1000 mL

## 2022-08-17 MED ORDER — RIVAROXABAN 20 MG PO TABS
20.0000 mg | ORAL_TABLET | Freq: Every day | ORAL | Status: DC
  Administered 2022-08-17: 20 mg
  Filled 2022-08-17 (×2): qty 1

## 2022-08-17 MED ORDER — DEXMEDETOMIDINE HCL IN NACL 400 MCG/100ML IV SOLN
0.0000 ug/kg/h | INTRAVENOUS | Status: DC
  Administered 2022-08-17: 0.7 ug/kg/h via INTRAVENOUS
  Administered 2022-08-17: 0.6 ug/kg/h via INTRAVENOUS
  Administered 2022-08-17: 0.4 ug/kg/h via INTRAVENOUS
  Administered 2022-08-18: 0.7 ug/kg/h via INTRAVENOUS
  Filled 2022-08-17 (×4): qty 100

## 2022-08-17 MED ORDER — BUDESONIDE 0.25 MG/2ML IN SUSP
0.2500 mg | Freq: Two times a day (BID) | RESPIRATORY_TRACT | Status: DC
  Administered 2022-08-17 – 2022-08-19 (×5): 0.25 mg via RESPIRATORY_TRACT
  Filled 2022-08-17 (×5): qty 2

## 2022-08-17 MED ORDER — FLUCONAZOLE 100 MG PO TABS
100.0000 mg | ORAL_TABLET | Freq: Every day | ORAL | Status: AC
  Administered 2022-08-17: 100 mg
  Filled 2022-08-17 (×2): qty 1

## 2022-08-17 MED ORDER — FREE WATER
30.0000 mL | Status: DC
  Administered 2022-08-17 – 2022-08-18 (×5): 30 mL

## 2022-08-17 MED ORDER — PROSOURCE TF20 ENFIT COMPATIBL EN LIQD
60.0000 mL | Freq: Every day | ENTERAL | Status: DC
  Filled 2022-08-17: qty 60

## 2022-08-17 MED ORDER — LACTATED RINGERS IV SOLN: INTRAVENOUS | Status: DC

## 2022-08-17 MED ORDER — FOLIC ACID 1 MG PO TABS
1.0000 mg | ORAL_TABLET | Freq: Every day | ORAL | Status: DC
  Administered 2022-08-17: 1 mg
  Filled 2022-08-17: qty 1

## 2022-08-17 MED ORDER — ATORVASTATIN CALCIUM 20 MG PO TABS
20.0000 mg | ORAL_TABLET | Freq: Every day | ORAL | Status: DC
  Administered 2022-08-17 (×2): 20 mg
  Filled 2022-08-17 (×2): qty 1

## 2022-08-17 MED ORDER — ESCITALOPRAM OXALATE 20 MG PO TABS
20.0000 mg | ORAL_TABLET | Freq: Every day | ORAL | Status: DC
  Administered 2022-08-17: 20 mg
  Filled 2022-08-17: qty 1

## 2022-08-17 MED ORDER — IPRATROPIUM-ALBUTEROL 0.5-2.5 (3) MG/3ML IN SOLN
3.0000 mL | RESPIRATORY_TRACT | Status: DC | PRN
  Administered 2022-08-18: 3 mL via RESPIRATORY_TRACT

## 2022-08-17 MED ORDER — METHYLPREDNISOLONE SODIUM SUCC 40 MG IJ SOLR
40.0000 mg | Freq: Two times a day (BID) | INTRAMUSCULAR | Status: AC
  Administered 2022-08-17 – 2022-08-18 (×4): 40 mg via INTRAVENOUS
  Filled 2022-08-17 (×4): qty 1

## 2022-08-17 NOTE — Progress Notes (Signed)
Initial Nutrition Assessment  DOCUMENTATION CODES:   Not applicable  INTERVENTION:   Vital 1.2@60ml /hr- Initiate at 52ml/hr and increase by 32ml/hr q 8 hours until goal rate is reached.   ProSource TF 20- Give 60ml daily via tube, each supplement provides 80kcal and 20g of protein.   Free water flushes 30ml q4 hours to maintain tube patency   Regimen provides 1808kcal/day, 128g/day protein and 1324ml/day of fluid.   Pt at high refeed risk; recommend monitor potassium, magnesium and phosphorus labs daily until stable  Daily weights   NUTRITION DIAGNOSIS:   Inadequate oral intake related to inability to eat (pt sedated and ventilated) as evidenced by NPO status.  GOAL:   Provide needs based on ASPEN/SCCM guidelines  MONITOR:   Vent status, Labs, Weight trends, TF tolerance, I & O's, Skin  REASON FOR ASSESSMENT:   Ventilator    ASSESSMENT:   62 y/o female with h/o HLD, HTN, COPD, hypothyroidism, anxiety, depression and CHF who is admitted with COPD exacerbation.  Pt sedated and ventilated. OGT in place. Will plan to initiate tube feeds today. Pt is at high refeed risk. Per chart, pt is down 26lbs(11%) since December; RD unsure how recently weight loss occurred.   Medications reviewed and include: diflucan, folic acid, insulin, synthroid, solu-medrol, protonix, Kphos, azithromycin, ceftriaxone, LRS @50ml /hr  Labs reviewed: K 4.4 wnl, P 2.1(L), Mg 2.5(H) Hgb 10.7(L) Cbgs- 119, 111 x 24 hrs  AIC 4.9- 8/15  Patient is currently intubated on ventilator support MV: 5.5 L/min Temp (24hrs), Avg:99.4 F (37.4 C), Min:97.4 F (36.3 C), Max:100.9 F (38.3 C)  Propofol: none   MAP- >5mmHg   UOP-   NUTRITION - FOCUSED PHYSICAL EXAM:  Flowsheet Row Most Recent Value  Orbital Region No depletion  Upper Arm Region No depletion  Thoracic and Lumbar Region No depletion  Buccal Region No depletion  Temple Region Moderate depletion  Clavicle Bone Region Mild  depletion  Clavicle and Acromion Bone Region Mild depletion  Scapular Bone Region No depletion  Dorsal Hand Unable to assess  Patellar Region No depletion  Anterior Thigh Region No depletion  Posterior Calf Region No depletion  Edema (RD Assessment) None  Hair Reviewed  Eyes Reviewed  Mouth Reviewed  Skin Reviewed  Nails Reviewed   Diet Order:   Diet Order             Diet NPO time specified  Diet effective now                  EDUCATION NEEDS:   No education needs have been identified at this time  Skin:  Skin Assessment: Reviewed RN Assessment (ecchymosis)  Last BM:  pta  Height:   Ht Readings from Last 1 Encounters:  08/16/22 5' 9.02" (1.753 m)    Weight:   Wt Readings from Last 1 Encounters:  08/17/22 93.5 kg    Ideal Body Weight:  65.9 kg  BMI:  Body mass index is 30.43 kg/m.  Estimated Nutritional Needs:   Kcal:  1852kcal/day  Protein:  120-130g/day  Fluid:  1.7-2.0L/day  Betsey Holiday MS, RD, LDN Please refer to [facility] for RD and/or RD on-call/weekend/after hours pager

## 2022-08-17 NOTE — H&P (Incomplete)
NAME:  Terri Casey, MRN:  [id number], DOB:  February 01, 1960, LOS: 0 ADMISSION DATE:  08/16/2022, CONSULTATION DATE:  08/16/22 REFERRING MD:  Dr. Erma Heritage, CHIEF COMPLAINT: ***    History of Present Illness:  62 yo F presenting to [facility] ED on 08/16/22 for evaluation Acute respiratory failure with hypercapnea, not medication compliant UTI- yeast- fluconzole 2 days, 3-4 times  History obtained *** EMS reported increasing dyspnea on chronic 3-4 now as of this 5 L [place] with in 70%'s. Dyspnea, lips blue. PT- normal to walking around, not good appetite 3 days. No falls no loc. Cigarettes 2-3 daily 15-16 years.  ED course: Upon arrival patient was lethargic placed on BIPAP support. Medications given: Duo neb x 3, solu medrol 125 mg, 2 g Mg, fentanyl/ versed***/ succinylcholine/ etomidate, 1 L NS bolus, propofol drip Initial Vitals: 97.8, 21, 77, 119/76, 97% on BIPAP @ 40% Significant labs: (Labs/ Imaging personally reviewed) I, Cheryll Cockayne Rust-Chester, AGACNP-BC, personally viewed and interpreted this ECG. EKG Interpretation: Date: ***, EKG Time: ***, Rate: ***, Rhythm: ***, QRS Axis:  *** Intervals: ***, ST/T Wave abnormalities: ***, Narrative Interpretation: *** Chemistry: Na+: 143, K+: 4.3, BUN/Cr.: 10/0.67, Serum CO2/ AG: 41/ 5, Cl: 97 Hematology: WBC: 4.8, Hgb: 12.8, MCV: 115, Plt: 125  Troponin: ***, BNP: ***, Lactic/ PCT: ***, COVID-19: negative VBG: 7.14/ >123/ <31/ 47  CXR 08/16/22: Mild Lingular scarring/atelectasis, no other cardiopulmonary abnormalities. CT head wo contrast 08/16/22: ***  PCCM consulted for admission due to ***.  Pertinent  Medical History  COPD T2DM HTN HFpEF Hypothyroidism PAF Anxiety Tobacco Abuse Asthma OSA Significant Hospital Events: Including procedures, antibiotic start and stop dates in addition to other pertinent events   08/16/22: Admit to ICU with acute on chronic hypoxic/hypercapnic respiratory failure s/t AECOPD, failed BIPAP and intubated in ED  requiring mechanical ventilatory support.  Interim History / Subjective:  ***  Objective   Blood pressure 119/76, pulse 77, temperature 97.8 F (36.6 C), resp. rate (!) 21, height 5\' 9"  (1.753 m), weight 98.9 kg, SpO2 97%.    FiO2 (%):  [35 %-40 %] 40 %   Intake/Output Summary (Last 24 hours) at 08/16/2022 2128 Last data filed at 08/16/2022 2053 Gross per 24 hour  Intake 50 ml  Output --  Net 50 ml   Filed Weights   08/16/22 2100 08/16/22 2120  Weight: 98.9 kg 98.9 kg    Examination: General: Adult ***, critically***acutely ill, lying in bed intubated & sedated requiring mechanical ventilation *** NAD HEENT: MM pink/moist, anicteric***, atraumatic, neck supple Neuro: A&O x *** commands, PERRL *** , MAE CV: s1s2 ***RRR, *** on monitor, no r/m/g Pulm: Regular, non labored on *** , breath sounds ***-BUL & ***-BLL GI: soft, ***, non***tender, bs x 4 GU: foley in place *** with clear yellow urine Skin: *** no rashes/lesions noted Extremities: warm/dry, pulses + 2 R/P, *** edema noted  Resolved Hospital Problem list     Assessment & Plan:  Acute on Chronic Hypoxic / Hypercapnic Respiratory Failure suspect secondary to AECOPD  PMHx: COPD, asthma, tobacco abuse - Ventilator settings: PRVC  8 mL/kg, 35% FiO2, 5 PEEP, continue ventilator support & lung protective strategies - Wean PEEP & FiO2 as tolerated, maintain SpO2 > 90% - Head of bed elevated 30 degrees, VAP protocol in place - Plateau pressures less than 30 cm H20  - Intermittent chest x-ray & ABG PRN - Daily WUA with SBT as tolerated  - Ensure adequate pulmonary hygiene  - F/u cultures, trend PCT -  Azithromycin ordered 5 days - Steroids initiated: solu-medrol 40 mg BID  - Budesonide nebs BID, Duo Nebs Q 6, bronchodilators PRN - PAD protocol in place: continue Fentanyl***Dilaudid drip***IVP & Propofol***Versed drip***IVP     Best Practice (right click and "Reselect all SmartList Selections" daily)  Diet/type: NPO  w/ meds via tube DVT prophylaxis: {anticoagulation (Optional):25687} GI prophylaxis: {ZO:10960} Lines: {Central Venous Access:25771} Foley:  {Central Venous Access:25691} Code Status:  {Code Status:26939} Last date of multidisciplinary goals of care discussion [***]  Labs   CBC: Recent Labs  Lab 08/16/22 2017  WBC 4.8  NEUTROABS 3.6  HGB 12.8  HCT 45.9  MCV 115.0*  PLT 125*    Basic Metabolic Panel: Recent Labs  Lab 08/16/22 2017  NA 143  K 4.3  CL 97*  CO2 41*  GLUCOSE 118*  BUN 10  CREATININE 0.67  CALCIUM 9.2   GFR: Estimated Creatinine Clearance: 90.1 mL/min (by C-G formula based on SCr of 0.67 mg/dL). Recent Labs  Lab 08/16/22 2017  WBC 4.8    Liver Function Tests: Recent Labs  Lab 08/16/22 2017  AST 15  ALT 11  ALKPHOS 95  BILITOT 0.5  PROT 7.2  ALBUMIN 4.0   No results for input(s): "LIPASE", "AMYLASE" in the last 168 hours. No results for input(s): "AMMONIA" in the last 168 hours.  ABG    Component Value Date/Time   PHART 7.39 12/16/2021 0946   PCO2ART 67 (HH) 12/16/2021 0946   PO2ART 62 (L) 12/16/2021 0946   HCO3 47.0 (H) 08/16/2022 2010   TCO2 34 (H) 09/12/2021 1634   ACIDBASEDEF 7.7 (H) 08/09/2019 0748   O2SAT 38.6 08/16/2022 2010     Coagulation Profile: No results for input(s): "INR", "PROTIME" in the last 168 hours.  Cardiac Enzymes: No results for input(s): "CKTOTAL", "CKMB", "CKMBINDEX", "TROPONINI" in the last 168 hours.  HbA1C: Hgb A1c MFr Bld  Date/Time Value Ref Range Status  09/11/2021 05:38 PM 4.9 4.8 - 5.6 % Final    Comment:    (NOTE) Pre diabetes:          5.7%-6.4%  Diabetes:              >6.4%  Glycemic control for   <7.0% adults with diabetes   09/11/2021 04:46 AM 4.9 4.8 - 5.6 % Final    Comment:    (NOTE) Pre diabetes:          5.7%-6.4%  Diabetes:              >6.4%  Glycemic control for   <7.0% adults with diabetes     CBG: No results for input(s): "GLUCAP" in the last 168  hours.  Review of Systems:   UTA- patient intubated and sedated.  Past Medical History:  She,  has a past medical history of Anxiety, Asthma, CHF (congestive heart failure) (HCC), COPD (chronic obstructive pulmonary disease) (HCC), Diabetes mellitus without complication (HCC), Hypertension, and Thyroid disease.   Surgical History:   Past Surgical History:  Procedure Laterality Date  . ABDOMINAL HYSTERECTOMY    . THORACENTESIS Left 09/18/2021   Procedure: THORACENTESIS;  Surgeon: Lupita Leash, MD;  Location: [facility] ENDOSCOPY;  Service: Cardiopulmonary;  Laterality: Left;     Social History:   reports that she has been smoking cigarettes. She has a 10 pack-year smoking history. She has never used smokeless tobacco. She reports that she does not drink alcohol and does not use drugs.   Family History:  Her family history is not on  file.   Allergies Allergies  Allergen Reactions  . Estrogens Hives  . Mustard Hives  . Strawberry Extract Hives     Home Medications  Prior to Admission medications   Medication Sig Start Date End Date Taking? Authorizing Provider  ADVAIR DISKUS 500-50 MCG/ACT AEPB Inhale 1 puff into the lungs in the morning and at bedtime. 12/04/21   [provider]  albuterol (PROAIR HFA) 108 (90 Base) MCG/ACT inhaler 2 puffs every 4 hours as needed only  if your can't catch your breath 03/21/21   Laural Benes, Clanford L, MD  albuterol (PROVENTIL) (2.5 MG/3ML) 0.083% nebulizer solution Take 3 mLs (2.5 mg total) by nebulization every 6 (six) hours as needed for wheezing or shortness of breath. 03/21/21   Johnson, Clanford L, MD  atorvastatin (LIPITOR) 20 MG tablet Take 20 mg by mouth at bedtime.     [provider]  bethanechol (URECHOLINE) 5 MG tablet Take 5 mg by mouth 2 (two) times daily. 08/06/19   [provider]  escitalopram (LEXAPRO) 20 MG tablet Take 20 mg by mouth daily. 10/25/21   [provider]  folic acid (FOLVITE) 1 MG tablet  Take 1 tablet (1 mg total) by mouth daily. 10/11/19   Catarina Hartshorn, MD  furosemide (LASIX) 40 MG tablet Take 1 tablet (40 mg total) by mouth daily. 10/11/19   Catarina Hartshorn, MD  hydrocortisone cream 1 % Apply topically 2 (two) times daily. 09/25/21   Pokhrel, Rebekah Chesterfield, MD  hydrOXYzine (ATARAX) 10 MG tablet Take 1 tablet (10 mg total) by mouth 3 (three) times daily. 12/18/21   Enedina Finner, MD  levothyroxine (SYNTHROID) 200 MCG tablet Take 1 tablet (200 mcg total) by mouth daily at 6 (six) AM. 06/20/19   Swayze, Ava, DO  oxyCODONE-acetaminophen (PERCOCET) 10-325 MG tablet Take 1 tablet by mouth 4 (four) times daily. 09/06/21   [provider]  pantoprazole (PROTONIX) 40 MG tablet Take 1 tablet (40 mg total) by mouth daily. 06/20/19   Swayze, Ava, DO  STIOLTO RESPIMAT 2.5-2.5 MCG/ACT AERS Inhale 2 each into the lungs daily. 12/04/21   [provider]  SUMAtriptan (IMITREX) 50 MG tablet Take 50 mg by mouth every 2 (two) hours as needed for migraine. 07/31/21   [provider]  topiramate (TOPAMAX) 100 MG tablet Take 100 mg by mouth 2 (two) times daily. 06/01/20   [provider]  Vitamin D, Ergocalciferol, (DRISDOL) 1.25 MG (50000 UNIT) CAPS capsule Take 50,000 Units by mouth once a week. 10/02/21   [provider]  XARELTO 20 MG TABS tablet Take 20 mg by mouth daily. 05/08/19   [provider]     Critical care time: ***       Cheryll Cockayne Rust-Chester, AGACNP-BC Acute Care Nurse Practitioner [facility]   [phone] / [phone] Please see Amion for details.

## 2022-08-17 NOTE — TOC CM/SW Note (Addendum)
Cm received consult for obtain advanced directive and primary contact/POA information.  Cm called son Mervyn Skeeters [phone] with no answer and LVM   Cm called Low[facility] Dell [phone] and [phone] no option to LVM  CM called PCP office of Seward Carol, NP [phone] and they are closed  Cm called [facility] [phone] and they stated they have no additional contact information.

## 2022-08-17 NOTE — Progress Notes (Deleted)
NAME:  Terri Casey, MRN:  [id number], DOB:  1960-10-29, LOS: 1 ADMISSION DATE:  08/16/2022, CONSULTATION DATE:  08/16/22 REFERRING MD:  Dr. Erma Heritage, CHIEF COMPLAINT: Shortness of Breath    Brief Pt Description / Synopsis:  62 y.o. 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  History of Present Illness:  62 yo F presenting to [facility] ED on 08/16/22 for evaluation of worsening dyspnea.  History obtained via chart review and telephone report from daughter-in-law, [name]. Daughter-in-law describes the patient as being in her baseline state of health until 08/13/2022.  The patient has had progressive shortness of breath and poor p.o. intake.  On the morning of 08/16/2022 she seemed more dyspneic and lethargic, she described her lips as "changing color".  They increased her nasal cannula oxygen to 5 L from 3-4, and the patient appeared to recover.  Later in the evening she had a similar episode becoming increasingly dyspneic, lethargic with changes to her coloration.  Daughter-in-law called EMS when she noted her SpO2 to be in the 70's. The patient lives with her daughter-in-law and son, they both feel the patient would benefit from being in a skilled nursing facility.  At first [name] reported the patient " does not take her medication the way she supposed to".  However after further questions, the patient is consistently taking her medication but not with food or during the exact times of dates prescribed.  [name] confirmed she has been taking her Xarelto and her inhalers.  She has been using her rescue inhaler about 3-4 times a day additionally.  [name] denied any complaints of fever/chills, nausea/vomiting/diarrhea/abdominal pain, falls/loss of consciousness, or new productive cough.  She also confirmed the patient has not been around anyone ill. [name] describes a recent yeast infection diagnosis the  patient was prescribed fluconazole, completing all but the last 2 doses. She reports current everyday smoking, 2-3 cigarettes daily for the last 40+ years. She denies any regular EtOH or recreational drug use.  She confirms that her outpatient regimen for anxiety has been working well.  ED course: Upon arrival patient was lethargic placed on BIPAP support, patient had mild tachypnea but otherwise vital signs stable on 40% FiO2.  At first the patient appeared to improve on BiPAP support but then became increasingly agitated and confused prompting urgent intubation and mechanical ventilatory support.  Labs significant for respiratory acidosis and elevated MCV with mild thrombocytopenia.  (At baseline the patient has macrocytic anemia) Medications given: Duo neb x 3, solu medrol 125 mg, 2 g Mg, fentanyl/ succinylcholine/ etomidate, 1 L NS bolus, propofol drip Initial Vitals: 97.8, 21, 77, 119/76, 97% on BIPAP @ 40% Significant labs: (Labs/ Imaging personally reviewed) I, Cheryll Cockayne Rust-Chester, AGACNP-BC, personally viewed and interpreted this ECG. EKG Interpretation: Date: 08/16/2022, EKG Time: 23:19, Rate: 65, Rhythm: NSR, QRS Axis: Borderline RAD, Intervals: Normal, ST/T Wave abnormalities: Mild inferior STE, Narrative Interpretation: NSR with borderline RAD and mild inferior ST that does not meet STEMI criteria Chemistry: Na+: 143, K+: 4.3, BUN/Cr.: 10/0.67, Serum CO2/ AG: 41/ 5, Cl: 97 Hematology: WBC: 4.8, Hgb: 12.8, MCV: 115, Plt: 125  Troponin: 13 > 14, BNP: 53, PCT: <0.10, COVID-19: negative VBG: 7.14/ >123/ <31/ 47  CXR 08/16/22: Mild Lingular scarring/atelectasis, no other cardiopulmonary abnormalities. CT head wo contrast 08/16/22: No acute intracranial abnormality  PCCM consulted for admission due to acute on chronic hypoxic/hypercapnic respiratory failure secondary to AECOPD requiring urgent intubation and  mechanical ventilatory support.    Please see "Significant Hospital Events"  section below for full detailed hospital course.  Pertinent  Medical History  COPD T2DM HTN HFpEF Hypothyroidism PAF Anxiety Tobacco Abuse Asthma OSA  MICRO Data:  8/15: COVID-19 PCR>> negative 8/15: Blood culture>> no growth to date 8/15: MRSA PCR>>negative 8/16: Respiratory viral panel>>negative  Antimicrobials:   Anti-infectives (From admission, onward)    Start     Dose/Rate Route Frequency Ordered Stop   08/17/22 1000  fluconazole (DIFLUCAN) tablet 100 mg        100 mg Per Tube Daily 08/17/22 0029 08/19/22 0959   08/17/22 0115  cefTRIAXone (ROCEPHIN) 2 g in sodium chloride 0.9 % 100 mL IVPB        2 g 200 mL/hr over 30 Minutes Intravenous Every 24 hours 08/17/22 0020     08/17/22 0100  azithromycin (ZITHROMAX) 500 mg in sodium chloride 0.9 % 250 mL IVPB        500 mg 250 mL/hr over 60 Minutes Intravenous Every 24 hours 08/17/22 0006 08/22/22 0059      Significant Hospital Events: Including procedures, antibiotic start and stop dates in addition to other pertinent events   08/16/22: Admit to ICU with acute on chronic hypoxic/hypercapnic respiratory failure s/t AECOPD, failed BIPAP and intubated in ED requiring mechanical ventilatory support. 08/17/22: On minimal vent settings (35% FiO2, 5 PEEP), will keep on vent today as she was just intubated in ED last night.  Interim History / Subjective:  -No significant events noted since arrival to ICU -Low grade fever with T max 100.9, no leukocytosis and Procalcitonin is negative x2 ~ will continue with Azithromycin & Ceftriaxone for now -Hemodynamically stable, no vasopressors -On minimal vent settings: 35% FiO2 & 5 PEEP ~ will keep on vent today as she was just intubated in ED last night   Objective   Blood pressure 110/70, pulse 80, temperature (!) 100.9 F (38.3 C), resp. rate 17, height 5' 9.02" (1.753 m), weight 93.5 kg, SpO2 94%.    Vent Mode: PRVC FiO2 (%):  [30 %-40 %] 35 % Set Rate:  [18 bmp-20 bmp] 18  bmp Vt Set:  [450 mL] 450 mL PEEP:  [5 cmH20] 5 cmH20 Plateau Pressure:  [13 cmH20-22 cmH20] 13 cmH20   Intake/Output Summary (Last 24 hours) at 08/17/2022 2956 Last data filed at 08/17/2022 0700 Gross per 24 hour  Intake 542.09 ml  Output 290 ml  Net 252.09 ml   Filed Weights   08/16/22 2120 08/16/22 2318 08/17/22 0500  Weight: 98.9 kg 93.5 kg 93.5 kg    Examination: General: Adult female, Acute on chronically ill appearing, lying in bed intubated & sedated requiring mechanical ventilation, NAD HEENT: MM pink/moist, anicteric, atraumatic, neck supple Neuro: Sedated, withdraws from pain, but unable to follow commands currently, PERRL +3, MAE CV: s1s2 RRR, NSR on monitor, no r/m/g Pulm: mechanical breath sounds throughout, no wheezing or rales noted, overbreathing the vent, even, nonlabored GI: soft, rounded, bs x 4 GU: foley in place with cloudy amber urine Skin:  no rashes/lesions noted Extremities: warm/dry, pulses + 2 R/P, no edema noted  Resolved Hospital Problem list     Assessment & Plan:   #Acute on Chronic Hypoxic / Hypercapnic Respiratory Failure suspect secondary to AECOPD  PMHx: COPD, asthma, tobacco abuse -Full vent support, implement lung protective strategies -Plateau pressures less than 30 cm H20 -Wean FiO2 & PEEP as tolerated to maintain O2 sats 88 to 92% -Follow intermittent Chest X-ray &  ABG as needed -Spontaneous Breathing Trials when respiratory parameters met and mental status permits -Implement VAP Bundle -Bronchodilators and Pulmicort nebs -Continue IV Solumedrol 40 mg BID -Continue Azithromycin & Ceftriaxone for severe COPD exacerbation -Smoking cessation counseling upon extubation  #Chronic / Paroxysmal Atrial Fibrillation  #Chronic HFpEF without acute exacerbation #Hyperlipidemia -Continuous cardiac monitoring -Maintain MAP >65 -Cautious IV fluids -Vasopressors as needed to maintain MAP goal ~ not requiring -continue outpatient Xarelto &  Lipitor -diurese with lasix as hemodynamics and renal function will allow  #Hypothyroidism -Continue outpatient Synthroid  #Acute Metabolic Encephalopathy in setting of  CO2 Narcosis #Sedation needs in setting of mechanical ventilation #Anxiety and depression CT Head negative on presentation -Treatment of metabolic derangements and hypercapnia as outlined above -Maintain a RASS goal of 0 to -1 -Fentanyl and Precedex as needed to maintain RASS goal -Avoid sedating medications as able -Daily wake up assessment -Continue outpatient Lexapro -Consider restarting hydroxyzine once patient is extubated  #Yeast infection -PTA -Continue last 2 doses of fluconazole -Monitor fever curve -Trend WBC's & Procalcitonin -Follow cultures as above -Continue empiric Azithromycin & Ceftriaxone for now for severe COPD Exacerbation pending cultures & sensitivities  #Type 2 Diabetes Mellitus #Risk for Steroid Induced Hyperglycemia Hemoglobin A1C: Pending - Monitor CBG Q 4 hours - SSI sensitive dosing > consider increasing range PRN with ongoing steroid use - target range while in ICU: 140-180 - follow ICU hyper/hypo-glycemia protocol    Pt is critically ill superimposed on severe COPD at baseline.  Prognosis is guarded, and she is high risk for further decompensation, cardiac arrest and death.  Best Practice (right click and "Reselect all SmartList Selections" daily)  Diet/type: NPO w/ meds via tube DVT prophylaxis: DOAC GI prophylaxis: H2B Lines: N/A Foley:  Yes, and it is still needed Code Status:  full code Last date of multidisciplinary goals of care discussion [08/17/22]  8/16:  Will update pt's family when arrive at bedside.  Labs   CBC: Recent Labs  Lab 08/16/22 2017 08/17/22 0603  WBC 4.8 4.4  NEUTROABS 3.6  --   HGB 12.8 10.7*  HCT 45.9 36.5  MCV 115.0* 108.6*  PLT 125* 122*    Basic Metabolic Panel: Recent Labs  Lab 08/16/22 2017 08/17/22 0603  NA 143 142  K 4.3  4.4  CL 97* 100  CO2 41* 33*  GLUCOSE 118* 110*  BUN 10 12  CREATININE 0.67 0.75  CALCIUM 9.2 9.0  MG  --  2.5*  PHOS  --  2.1*   GFR: Estimated Creatinine Clearance: 87.6 mL/min (by C-G formula based on SCr of 0.75 mg/dL). Recent Labs  Lab 08/16/22 2017 08/17/22 0603  PROCALCITON <0.10 <0.10  WBC 4.8 4.4    Liver Function Tests: Recent Labs  Lab 08/16/22 2017  AST 15  ALT 11  ALKPHOS 95  BILITOT 0.5  PROT 7.2  ALBUMIN 4.0   No results for input(s): "LIPASE", "AMYLASE" in the last 168 hours. No results for input(s): "AMMONIA" in the last 168 hours.  ABG    Component Value Date/Time   PHART 7.43 08/17/2022 0028   PCO2ART 54 (H) 08/17/2022 0028   PO2ART 69 (L) 08/17/2022 0028   HCO3 35.8 (H) 08/17/2022 0028   TCO2 34 (H) 09/12/2021 1634   ACIDBASEDEF 7.7 (H) 08/09/2019 0748   O2SAT 98.2 08/17/2022 0028     Coagulation Profile: No results for input(s): "INR", "PROTIME" in the last 168 hours.  Cardiac Enzymes: No results for input(s): "CKTOTAL", "CKMB", "CKMBINDEX", "TROPONINI" in the  last 168 hours.  HbA1C: Hgb A1c MFr Bld  Date/Time Value Ref Range Status  08/16/2022 09:45 PM 4.9 4.8 - 5.6 % Final    Comment:    (NOTE) Pre diabetes:          5.7%-6.4%  Diabetes:              >6.4%  Glycemic control for   <7.0% adults with diabetes   09/11/2021 05:38 PM 4.9 4.8 - 5.6 % Final    Comment:    (NOTE) Pre diabetes:          5.7%-6.4%  Diabetes:              >6.4%  Glycemic control for   <7.0% adults with diabetes     CBG: Recent Labs  Lab 08/16/22 2309 08/17/22 0353 08/17/22 0725  GLUCAP 109* 111* 119*    Review of Systems:   UTA- patient intubated and sedated.  Past Medical History:  She,  has a past medical history of Anxiety, Asthma, CHF (congestive heart failure) (HCC), COPD (chronic obstructive pulmonary disease) (HCC), Diabetes mellitus without complication (HCC), Hypertension, and Thyroid disease.   Surgical History:   Past  Surgical History:  Procedure Laterality Date   ABDOMINAL HYSTERECTOMY     THORACENTESIS Left 09/18/2021   Procedure: THORACENTESIS;  Surgeon: Lupita Leash, MD;  Location: [facility] ENDOSCOPY;  Service: Cardiopulmonary;  Laterality: Left;     Social History:   reports that she has been smoking cigarettes. She has a 10 pack-year smoking history. She has never used smokeless tobacco. She reports that she does not drink alcohol and does not use drugs.   Family History:  Her family history is not on file.   Allergies Allergies  Allergen Reactions   Estrogens Hives   Mustard Hives   Strawberry Extract Hives     Home Medications  Prior to Admission medications   Medication Sig Start Date End Date Taking? Authorizing Provider  ADVAIR DISKUS 500-50 MCG/ACT AEPB Inhale 1 puff into the lungs in the morning and at bedtime. 12/04/21   [provider]  albuterol (PROAIR HFA) 108 (90 Base) MCG/ACT inhaler 2 puffs every 4 hours as needed only  if your can't catch your breath 03/21/21   Laural Benes, Clanford L, MD  albuterol (PROVENTIL) (2.5 MG/3ML) 0.083% nebulizer solution Take 3 mLs (2.5 mg total) by nebulization every 6 (six) hours as needed for wheezing or shortness of breath. 03/21/21   Johnson, Clanford L, MD  atorvastatin (LIPITOR) 20 MG tablet Take 20 mg by mouth at bedtime.     [provider]  bethanechol (URECHOLINE) 5 MG tablet Take 5 mg by mouth 2 (two) times daily. 08/06/19   [provider]  escitalopram (LEXAPRO) 20 MG tablet Take 20 mg by mouth daily. 10/25/21   [provider]  folic acid (FOLVITE) 1 MG tablet Take 1 tablet (1 mg total) by mouth daily. 10/11/19   Catarina Hartshorn, MD  furosemide (LASIX) 40 MG tablet Take 1 tablet (40 mg total) by mouth daily. 10/11/19   Catarina Hartshorn, MD  hydrocortisone cream 1 % Apply topically 2 (two) times daily. 09/25/21   Pokhrel, Rebekah Chesterfield, MD  hydrOXYzine (ATARAX) 10 MG tablet Take 1 tablet (10 mg total) by mouth 3 (three) times  daily. 12/18/21   Enedina Finner, MD  levothyroxine (SYNTHROID) 200 MCG tablet Take 1 tablet (200 mcg total) by mouth daily at 6 (six) AM. 06/20/19   Swayze, Ava, DO  oxyCODONE-acetaminophen (PERCOCET) 10-325 MG  tablet Take 1 tablet by mouth 4 (four) times daily. 09/06/21   [provider]  pantoprazole (PROTONIX) 40 MG tablet Take 1 tablet (40 mg total) by mouth daily. 06/20/19   Swayze, Ava, DO  STIOLTO RESPIMAT 2.5-2.5 MCG/ACT AERS Inhale 2 each into the lungs daily. 12/04/21   [provider]  SUMAtriptan (IMITREX) 50 MG tablet Take 50 mg by mouth every 2 (two) hours as needed for migraine. 07/31/21   [provider]  topiramate (TOPAMAX) 100 MG tablet Take 100 mg by mouth 2 (two) times daily. 06/01/20   [provider]  Vitamin D, Ergocalciferol, (DRISDOL) 1.25 MG (50000 UNIT) CAPS capsule Take 50,000 Units by mouth once a week. 10/02/21   [provider]  XARELTO 20 MG TABS tablet Take 20 mg by mouth daily. 05/08/19   [provider]     Critical care time: 40 minutes    Harlon Ditty, AGACNP-BC [facility] Prefer epic messenger for cross cover needs If after hours, please call E-link

## 2022-08-17 NOTE — Progress Notes (Signed)
An USGPIV (ultrasound guided PIV) has been placed for short-term vasopressor infusion. A correctly placed ivWatch must be used when administering vasopressors. Should this treatment be needed beyond 72 hours, central line access should be obtained.  It will be the responsibility of the bedside nurse to follow best practice to prevent extravasations.   

## 2022-08-17 NOTE — Progress Notes (Signed)
NAME:  Terri Casey, MRN:  [id number], DOB:  01-17-1960, LOS: 1 ADMISSION DATE:  08/16/2022,  CHIEF COMPLAINT:  Respiratory Failure   History of Present Illness:   62 yo F presenting to [facility] ED on 08/16/22 for evaluation of worsening dyspnea.   History obtained via chart review and telephone report from daughter-in-law, [name]. Daughter-in-law describes the patient as being in her baseline state of health until 08/13/2022.  The patient has had progressive shortness of breath and poor p.o. intake.  On the morning of 08/16/2022 she seemed more dyspneic and lethargic, she described her lips as "changing color".  They increased her nasal cannula oxygen to 5 L from 3-4, and the patient appeared to recover.  Later in the evening she had a similar episode becoming increasingly dyspneic, lethargic with changes to her coloration.  Daughter-in-law called EMS when she noted her SpO2 to be in the 70's. The patient lives with her daughter-in-law and son, they both feel the patient would benefit from being in a skilled nursing facility.  At first [name] reported the patient " does not take her medication the way she supposed to".  However after further questions, the patient is consistently taking her medication but not with food or during the exact times of dates prescribed.  [name] confirmed she has been taking her Xarelto and her inhalers.  She has been using her rescue inhaler about 3-4 times a day additionally.  [name] denied any complaints of fever/chills, nausea/vomiting/diarrhea/abdominal pain, falls/loss of consciousness, or new productive cough.  She also confirmed the patient has not been around anyone ill. [name] describes a recent yeast infection diagnosis the patient was prescribed fluconazole, completing all but the last 2 doses. She reports current everyday smoking, 2-3 cigarettes daily for the last 40+ years. She denies any regular EtOH or recreational drug use.  She confirms that her outpatient  regimen for anxiety has been working well.   ED course: Upon arrival patient was lethargic placed on BIPAP support, patient had mild tachypnea but otherwise vital signs stable on 40% FiO2.  At first the patient appeared to improve on BiPAP support but then became increasingly agitated and confused prompting urgent intubation and mechanical ventilatory support.  Labs significant for respiratory acidosis and elevated MCV with mild thrombocytopenia.  (At baseline the patient has macrocytic anemia) Medications given: Duo neb x 3, solu medrol 125 mg, 2 g Mg, fentanyl/ succinylcholine/ etomidate, 1 L NS bolus, propofol drip  CXR 08/16/22: Mild Lingular scarring/atelectasis, no other cardiopulmonary abnormalities. CT head wo contrast 08/16/22: No acute intracranial abnormality   PCCM consulted for admission due to acute on chronic hypoxic/hypercapnic respiratory failure secondary to AECOPD requiring urgent intubation and mechanical ventilatory support.  Pertinent  Medical History   COPD T2DM HTN HFpEF Hypothyroidism PAF Anxiety Tobacco Abuse Asthma OSA  Significant Hospital Events: Including procedures, antibiotic start and stop dates in addition to other pertinent events   08/16/22: Admit to ICU with acute on chronic hypoxic/hypercapnic respiratory failure s/t AECOPD, failed BIPAP and intubated in ED requiring mechanical ventilatory support. 08/17/22: remains intubated   Interim History / Subjective:  Remains intubated, follows simple commands  Objective   Blood pressure 110/70, pulse 69, temperature (!) 100.6 F (38.1 C), resp. rate 18, height 5' 9.02" (1.753 m), weight 93.5 kg, SpO2 94%.    Vent Mode: PRVC FiO2 (%):  [30 %-40 %] 35 % Set Rate:  [18 bmp-20 bmp] 18 bmp Vt Set:  [450 mL] 450 mL PEEP:  [5 cmH20]  5 cmH20 Plateau Pressure:  [13 cmH20-22 cmH20] 13 cmH20   Intake/Output Summary (Last 24 hours) at 08/17/2022 0835 Last data filed at 08/17/2022 0700 Gross per 24 hour   Intake 542.09 ml  Output 290 ml  Net 252.09 ml   Filed Weights   08/16/22 2120 08/16/22 2318 08/17/22 0500  Weight: 98.9 kg 93.5 kg 93.5 kg    Examination: Physical Exam Vitals reviewed.  Constitutional:      General: She is not in acute distress.    Appearance: She is ill-appearing.  HENT:     Mouth/Throat:     Comments: ETT in place Cardiovascular:     Rate and Rhythm: Normal rate and regular rhythm.     Pulses: Normal pulses.     Heart sounds: Normal heart sounds.  Pulmonary:     Breath sounds: No rales.     Comments: Ventilated breath sounds anteriorly Musculoskeletal:     Right lower leg: No edema.     Left lower leg: No edema.  Neurological:     Comments: Opens eyes, follows commands      Assessment & Plan:   Neurology #Toxic Metabolic Encephalopathy #CO2 Narcosis  Altered mental status secondary to toxic metabolic encephalopathy from CO2 narcosis. Hypercapnia improved following intubation, now opens eyes and follows commands. Goal RASS -1  -Maintain a RASS goal of -1 -dexmedetomidine and fentanyl boluses to maintain RASS goal -Avoid sedating medications as able -Daily wake up assessment  Cardiovascular #Afib #HFpEF  No sign of volume overload, BNP at 53 on admission, troponin negative x2. Patient has maintained a normal blood pressure and she is in sinus rhythm. She's on Rivaroxaban for stroke prophylaxis which is continued.  Pulmonary #Acute on Chronic Hypoxic and Hypercapnic Respiratory Failure #COPD Exacerbation #Chronic Left Pleural Effusion  Admission with COPD exacerbation resulting in acute on chronic hypoxic and hypercapnic respiratory failure. Chest imaging with left sided infiltrate that appears chronic, with previous chest CT showing a left sided pleural effusion. Normal white count, Procalcitonin of < 0.10, and stable hemodynamics argue against an infected pleural space. Patient intubated and ventilated for respiratory failure with  improvement in her blood gas which we will continue to trend. Goal pH between 7.3 and 7.4 given chronic hypercapnia. COPD exacerbation treated with steroids, antibiotics, and nebulizers. Patient is receiving therapeutic anti-coagulation with rivaroxaban, less likely for VTE to be driving this presentation.  -Full vent support, implement lung protective strategies -Plateau pressures less than 30 cm H20 -Wean FiO2 & PEEP as tolerated to maintain O2 sats >92% -Follow intermittent Chest X-ray & ABG as needed -Spontaneous Breathing Trials when respiratory parameters met and mental status permits -Implement VAP Bundle -Prn Bronchodilators -continue methylprednisolone 40 mg bid -duonebs every 4 hours  Gastrointestinal  PPI for prophylaxis. Will initiate tube feeds.  Renal  Kidney function at baseline, monitor electrolytes and replete as indicated. Avoid nephrotoxins.  Endocrine #Hypothyroidism #T2DM  History of hypothyroidism with TSH of 18.46 in 2023. She's on levothyroxine which is restarted at 200 mcg daily. Patient also receiving steroids and is maintained on ICU glycemic protocol.  Hem/Onc  Continue rivaroxaban, monitor hemoglobin and platelets. She is anti-coagulated and a CTA of the chest is unlikely to change her management. Will check duplex to assess for VTE.  -check duplex lower extremities  ID #COPD Exacerbation  Antibiotics initiated for management of COPD exacerbation. Continue Ceftriaxone and azithromycin for CAP coverage. Respiratory viral panel (including COVID) was negative.  Best Practice (right click and "Reselect all SmartList Selections"  daily)   Diet/type: tubefeeds DVT prophylaxis: DOAC GI prophylaxis: PPI Lines: N/A Foley:  Yes, and it is still needed Code Status:  full code Last date of multidisciplinary goals of care discussion [08/17/2022]  Labs   CBC: Recent Labs  Lab 08/16/22 2017 08/17/22 0603  WBC 4.8 4.4  NEUTROABS 3.6  --   HGB 12.8  10.7*  HCT 45.9 36.5  MCV 115.0* 108.6*  PLT 125* 122*    Basic Metabolic Panel: Recent Labs  Lab 08/16/22 2017 08/17/22 0603  NA 143 142  K 4.3 4.4  CL 97* 100  CO2 41* 33*  GLUCOSE 118* 110*  BUN 10 12  CREATININE 0.67 0.75  CALCIUM 9.2 9.0  MG  --  2.5*  PHOS  --  2.1*   GFR: Estimated Creatinine Clearance: 87.6 mL/min (by C-G formula based on SCr of 0.75 mg/dL). Recent Labs  Lab 08/16/22 2017 08/17/22 0603  PROCALCITON <0.10 <0.10  WBC 4.8 4.4    Liver Function Tests: Recent Labs  Lab 08/16/22 2017  AST 15  ALT 11  ALKPHOS 95  BILITOT 0.5  PROT 7.2  ALBUMIN 4.0   No results for input(s): "LIPASE", "AMYLASE" in the last 168 hours. No results for input(s): "AMMONIA" in the last 168 hours.  ABG    Component Value Date/Time   PHART 7.43 08/17/2022 0028   PCO2ART 54 (H) 08/17/2022 0028   PO2ART 69 (L) 08/17/2022 0028   HCO3 35.8 (H) 08/17/2022 0028   TCO2 34 (H) 09/12/2021 1634   ACIDBASEDEF 7.7 (H) 08/09/2019 0748   O2SAT 98.2 08/17/2022 0028     Coagulation Profile: No results for input(s): "INR", "PROTIME" in the last 168 hours.  Cardiac Enzymes: No results for input(s): "CKTOTAL", "CKMB", "CKMBINDEX", "TROPONINI" in the last 168 hours.  HbA1C: Hgb A1c MFr Bld  Date/Time Value Ref Range Status  08/16/2022 09:45 PM 4.9 4.8 - 5.6 % Final    Comment:    (NOTE) Pre diabetes:          5.7%-6.4%  Diabetes:              >6.4%  Glycemic control for   <7.0% adults with diabetes   09/11/2021 05:38 PM 4.9 4.8 - 5.6 % Final    Comment:    (NOTE) Pre diabetes:          5.7%-6.4%  Diabetes:              >6.4%  Glycemic control for   <7.0% adults with diabetes     CBG: Recent Labs  Lab 08/16/22 2309 08/17/22 0353 08/17/22 0725  GLUCAP 109* 111* 119*    Review of Systems:   Unable to obtain  Past Medical History:  She,  has a past medical history of Anxiety, Asthma, CHF (congestive heart failure) (HCC), COPD (chronic obstructive  pulmonary disease) (HCC), Diabetes mellitus without complication (HCC), Hypertension, and Thyroid disease.   Surgical History:   Past Surgical History:  Procedure Laterality Date   ABDOMINAL HYSTERECTOMY     THORACENTESIS Left 09/18/2021   Procedure: THORACENTESIS;  Surgeon: Lupita Leash, MD;  Location: [facility] ENDOSCOPY;  Service: Cardiopulmonary;  Laterality: Left;     Social History:   reports that she has been smoking cigarettes. She has a 10 pack-year smoking history. She has never used smokeless tobacco. She reports that she does not drink alcohol and does not use drugs.   Family History:  Her family history is not on file.   Allergies Allergies  Allergen Reactions   Estrogens Hives   Mustard Hives   Strawberry Extract Hives     Home Medications  Prior to Admission medications   Medication Sig Start Date End Date Taking? Authorizing Provider  fluconazole (DIFLUCAN) 100 MG tablet Take 100 mg by mouth daily. 08/08/22  Yes [provider]  ADVAIR DISKUS 500-50 MCG/ACT AEPB Inhale 1 puff into the lungs in the morning and at bedtime. 12/04/21   [provider]  albuterol (PROAIR HFA) 108 (90 Base) MCG/ACT inhaler 2 puffs every 4 hours as needed only  if your can't catch your breath 03/21/21   Laural Benes, Clanford L, MD  albuterol (PROVENTIL) (2.5 MG/3ML) 0.083% nebulizer solution Take 3 mLs (2.5 mg total) by nebulization every 6 (six) hours as needed for wheezing or shortness of breath. 03/21/21   Johnson, Clanford L, MD  atorvastatin (LIPITOR) 20 MG tablet Take 20 mg by mouth at bedtime.     [provider]  bethanechol (URECHOLINE) 5 MG tablet Take 5 mg by mouth 2 (two) times daily. 08/06/19   [provider]  escitalopram (LEXAPRO) 20 MG tablet Take 20 mg by mouth daily. 10/25/21   [provider]  folic acid (FOLVITE) 1 MG tablet Take 1 tablet (1 mg total) by mouth daily. 10/11/19   Catarina Hartshorn, MD  furosemide (LASIX) 40 MG tablet Take 1  tablet (40 mg total) by mouth daily. 10/11/19   Catarina Hartshorn, MD  hydrocortisone cream 1 % Apply topically 2 (two) times daily. 09/25/21   Pokhrel, Rebekah Chesterfield, MD  hydrOXYzine (ATARAX) 10 MG tablet Take 1 tablet (10 mg total) by mouth 3 (three) times daily. 12/18/21   Enedina Finner, MD  levothyroxine (SYNTHROID) 200 MCG tablet Take 1 tablet (200 mcg total) by mouth daily at 6 (six) AM. 06/20/19   Swayze, Ava, DO  oxyCODONE-acetaminophen (PERCOCET) 10-325 MG tablet Take 1 tablet by mouth 4 (four) times daily. 09/06/21   [provider]  pantoprazole (PROTONIX) 40 MG tablet Take 1 tablet (40 mg total) by mouth daily. 06/20/19   Swayze, Ava, DO  STIOLTO RESPIMAT 2.5-2.5 MCG/ACT AERS Inhale 2 each into the lungs daily. 12/04/21   [provider]  SUMAtriptan (IMITREX) 50 MG tablet Take 50 mg by mouth every 2 (two) hours as needed for migraine. 07/31/21   [provider]  topiramate (TOPAMAX) 100 MG tablet Take 100 mg by mouth 2 (two) times daily. 06/01/20   [provider]  Vitamin D, Ergocalciferol, (DRISDOL) 1.25 MG (50000 UNIT) CAPS capsule Take 50,000 Units by mouth once a week. 10/02/21   [provider]  XARELTO 20 MG TABS tablet Take 20 mg by mouth daily. 05/08/19   [provider]     Critical care time: 65 minutes    Raechel Chute, MD [facility] 08/17/2022 10:44 AM

## 2022-08-17 NOTE — Consult Note (Signed)
PHARMACY CONSULT NOTE - ELECTROLYTES  Pharmacy Consult for Electrolyte Monitoring and Replacement   Recent Labs: Potassium (mmol/L)  Date Value  08/17/2022 4.4   Magnesium (mg/dL)  Date Value  40/98/1191 2.5 (H)   Calcium (mg/dL)  Date Value  47/82/9562 9.0   Albumin (g/dL)  Date Value  13/08/6576 4.0   Phosphorus (mg/dL)  Date Value  46/96/2952 2.1 (L)   Sodium (mmol/L)  Date Value  08/17/2022 142   Height: 5' 9.02" (175.3 cm) Weight: 93.5 kg (206 lb 2.1 oz) IBW/kg (Calculated) : 66.24 Estimated Creatinine Clearance: 87.6 mL/min (by C-G formula based on SCr of 0.75 mg/dL).  Assessment  Terri Casey is a 62 y.o. female presenting with acute on chronic respiratory failure. PMH significant for COPD, DM, HTN, HFpEF, hypothyroidism, PAF, anxiety, tobacco use disorder, asthma, OSA. Pharmacy has been consulted to monitor and replace electrolytes.  Diet: N/A MIVF: N/A Pertinent medications: N/A  Goal of Therapy: Electrolytes within normal limits  Plan:  Phos 2.1, Phos-Nak 2 packets per tube x 1 dose Follow-up electrolytes as ordered with labs tomorrow AM  Thank you for allowing pharmacy to be a part of this patient's care.  Tressie Ellis 08/17/2022 8:57 AM

## 2022-08-18 ENCOUNTER — Inpatient Hospital Stay: Payer: Medicaid Other

## 2022-08-18 DIAGNOSIS — J9622 Acute and chronic respiratory failure with hypercapnia: Secondary | ICD-10-CM | POA: Diagnosis not present

## 2022-08-18 DIAGNOSIS — J9621 Acute and chronic respiratory failure with hypoxia: Secondary | ICD-10-CM | POA: Diagnosis not present

## 2022-08-18 DIAGNOSIS — J441 Chronic obstructive pulmonary disease with (acute) exacerbation: Secondary | ICD-10-CM | POA: Diagnosis not present

## 2022-08-18 LAB — BLOOD GAS, ARTERIAL
Acid-Base Excess: 9.4 mmol/L — ABNORMAL HIGH (ref 0.0–2.0)
Bicarbonate: 34.8 mmol/L — ABNORMAL HIGH (ref 20.0–28.0)
FIO2: 45 %
MECHVT: 450 mL
Mechanical Rate: 10
O2 Saturation: 99 %
PEEP: 5 cmH2O
Patient temperature: 37
pCO2 arterial: 49 mmHg — ABNORMAL HIGH (ref 32–48)
pH, Arterial: 7.46 — ABNORMAL HIGH (ref 7.35–7.45)
pO2, Arterial: 90 mmHg (ref 83–108)

## 2022-08-18 LAB — RENAL FUNCTION PANEL
Albumin: 3.3 g/dL — ABNORMAL LOW (ref 3.5–5.0)
Anion gap: 6 (ref 5–15)
BUN: 24 mg/dL — ABNORMAL HIGH (ref 8–23)
CO2: 30 mmol/L (ref 22–32)
Calcium: 8.8 mg/dL — ABNORMAL LOW (ref 8.9–10.3)
Chloride: 102 mmol/L (ref 98–111)
Creatinine, Ser: 0.75 mg/dL (ref 0.44–1.00)
GFR, Estimated: >60 mL/min (ref >60)
Glucose, Bld: 126 mg/dL — ABNORMAL HIGH (ref 70–99)
Phosphorus: 3.8 mg/dL (ref 2.5–4.6)
Potassium: 4.3 mmol/L (ref 3.5–5.1)
Sodium: 138 mmol/L (ref 135–145)

## 2022-08-18 LAB — CBC
HCT: 32.6 % — ABNORMAL LOW (ref 36.0–46.0)
Hemoglobin: 10.3 g/dL — ABNORMAL LOW (ref 12.0–15.0)
MCH: 32 pg (ref 26.0–34.0)
MCHC: 31.6 g/dL (ref 30.0–36.0)
MCV: 101.2 fL — ABNORMAL HIGH (ref 80.0–100.0)
Platelets: 93 10*3/uL — ABNORMAL LOW (ref 150–400)
RBC: 3.22 MIL/uL — ABNORMAL LOW (ref 3.87–5.11)
RDW: 13.8 % (ref 11.5–15.5)
WBC: 4.4 10*3/uL (ref 4.0–10.5)
nRBC: 0 % (ref 0.0–0.2)

## 2022-08-18 LAB — GLUCOSE, CAPILLARY
Glucose-Capillary: 147 mg/dL — ABNORMAL HIGH (ref 70–99)
Glucose-Capillary: 114 mg/dL — ABNORMAL HIGH (ref 70–99)

## 2022-08-18 LAB — MAGNESIUM: Magnesium: 2.3 mg/dL (ref 1.7–2.4)

## 2022-08-18 LAB — PROCALCITONIN: Procalcitonin: <0.1 ng/mL

## 2022-08-18 MED ORDER — LEVOTHYROXINE SODIUM 100 MCG PO TABS
200.0000 ug | ORAL_TABLET | Freq: Every day | ORAL | Status: DC
  Administered 2022-08-20 – 2022-08-24 (×5): 200 ug via ORAL
  Filled 2022-08-18 (×5): qty 2

## 2022-08-18 MED ORDER — DEXTROSE 50 % IV SOLN
12.5000 g | INTRAVENOUS | Status: AC
  Administered 2022-08-18: 12.5 g via INTRAVENOUS
  Filled 2022-08-18: qty 50

## 2022-08-18 MED ORDER — FOLIC ACID 1 MG PO TABS
1.0000 mg | ORAL_TABLET | Freq: Every day | ORAL | Status: DC
  Administered 2022-08-19 – 2022-08-24 (×6): 1 mg via ORAL
  Filled 2022-08-18 (×6): qty 1

## 2022-08-18 MED ORDER — RIVAROXABAN 20 MG PO TABS
20.0000 mg | ORAL_TABLET | Freq: Every day | ORAL | Status: DC
  Administered 2022-08-19 – 2022-08-23 (×5): 20 mg via ORAL
  Filled 2022-08-18 (×7): qty 1

## 2022-08-18 MED ORDER — ESCITALOPRAM OXALATE 20 MG PO TABS
20.0000 mg | ORAL_TABLET | Freq: Every day | ORAL | Status: DC
  Administered 2022-08-19 – 2022-08-20 (×2): 20 mg via ORAL
  Filled 2022-08-18 (×2): qty 1

## 2022-08-18 MED ORDER — ATORVASTATIN CALCIUM 20 MG PO TABS
20.0000 mg | ORAL_TABLET | Freq: Every day | ORAL | Status: DC
  Administered 2022-08-19 – 2022-08-23 (×5): 20 mg via ORAL
  Filled 2022-08-18 (×5): qty 1

## 2022-08-18 MED ORDER — METHYLPREDNISOLONE SODIUM SUCC 40 MG IJ SOLR
40.0000 mg | INTRAMUSCULAR | Status: DC
  Administered 2022-08-19 – 2022-08-20 (×2): 40 mg via INTRAVENOUS
  Filled 2022-08-18 (×2): qty 1

## 2022-08-18 NOTE — Progress Notes (Signed)
Patient extubated to BIPAP per MD order. Patient tolerating BIPAP well. No issues with extubation. Patient is on 12/6, R10, 40% oxygen saturation on these BIPAP settings is 96%.

## 2022-08-18 NOTE — Progress Notes (Signed)
NAME:  Terri Casey, MRN:  [id number], DOB:  July 04, 1960, LOS: 2 ADMISSION DATE:  08/16/2022,  CHIEF COMPLAINT:  Respiratory Failure   History of Present Illness:   62 yo F presenting to [facility] ED on 08/16/22 for evaluation of worsening dyspnea.   History obtained via chart review and telephone report from daughter-in-law, [name]. Daughter-in-law describes the patient as being in her baseline state of health until 08/13/2022.  The patient has had progressive shortness of breath and poor p.o. intake.  On the morning of 08/16/2022 she seemed more dyspneic and lethargic, she described her lips as "changing color".  They increased her nasal cannula oxygen to 5 L from 3-4, and the patient appeared to recover.  Later in the evening she had a similar episode becoming increasingly dyspneic, lethargic with changes to her coloration.  Daughter-in-law called EMS when she noted her SpO2 to be in the 70's. The patient lives with her daughter-in-law and son, they both feel the patient would benefit from being in a skilled nursing facility.  At first [name] reported the patient " does not take her medication the way she supposed to".  However after further questions, the patient is consistently taking her medication but not with food or during the exact times of dates prescribed.  [name] confirmed she has been taking her Xarelto and her inhalers.  She has been using her rescue inhaler about 3-4 times a day additionally.  [name] denied any complaints of fever/chills, nausea/vomiting/diarrhea/abdominal pain, falls/loss of consciousness, or new productive cough.  She also confirmed the patient has not been around anyone ill. [name] describes a recent yeast infection diagnosis the patient was prescribed fluconazole, completing all but the last 2 doses. She reports current everyday smoking, 2-3 cigarettes daily for the last 40+ years. She denies any regular EtOH or recreational drug use.  She confirms that her outpatient  regimen for anxiety has been working well.   ED course: Upon arrival patient was lethargic placed on BIPAP support, patient had mild tachypnea but otherwise vital signs stable on 40% FiO2.  At first the patient appeared to improve on BiPAP support but then became increasingly agitated and confused prompting urgent intubation and mechanical ventilatory support.  Labs significant for respiratory acidosis and elevated MCV with mild thrombocytopenia.  (At baseline the patient has macrocytic anemia) Medications given: Duo neb x 3, solu medrol 125 mg, 2 g Mg, fentanyl/ succinylcholine/ etomidate, 1 L NS bolus, propofol drip  CXR 08/16/22: Mild Lingular scarring/atelectasis, no other cardiopulmonary abnormalities. CT head wo contrast 08/16/22: No acute intracranial abnormality   PCCM consulted for admission due to acute on chronic hypoxic/hypercapnic respiratory failure secondary to AECOPD requiring urgent intubation and mechanical ventilatory support.  Pertinent  Medical History   COPD T2DM HTN HFpEF Hypothyroidism PAF Anxiety Tobacco Abuse Asthma OSA  Significant Hospital Events: Including procedures, antibiotic start and stop dates in addition to other pertinent events   08/16/22: Admit to ICU with acute on chronic hypoxic/hypercapnic respiratory failure s/t AECOPD, failed BIPAP and intubated in ED requiring mechanical ventilatory support. 08/17/22: remains intubated 08/18/22: follows commands, passing SBT   Interim History / Subjective:  Remains intubated, awake and alert. follows commands  Objective   Blood pressure (!) 99/51, pulse (!) 54, temperature 98.4 F (36.9 C), resp. rate (!) 23, height 5' 9.02" (1.753 m), weight 93.5 kg, SpO2 96%.    Vent Mode: PSV FiO2 (%):  [45 %] 45 % Set Rate:  [10 bmp] 10 bmp Vt Set:  [332  mL] 450 mL PEEP:  [5 cmH20] 5 cmH20 Pressure Support:  [5 cmH20] 5 cmH20 Plateau Pressure:  [12 cmH20-15 cmH20] 15 cmH20   Intake/Output Summary (Last 24  hours) at 08/18/2022 0814 Last data filed at 08/18/2022 0745 Gross per 24 hour  Intake 2479.57 ml  Output 590 ml  Net 1889.57 ml   Filed Weights   08/16/22 2318 08/17/22 0500 08/18/22 0500  Weight: 93.5 kg 93.5 kg 93.5 kg    Examination: Physical Exam Vitals reviewed.  Constitutional:      General: She is not in acute distress.    Appearance: She is ill-appearing.  HENT:     Mouth/Throat:     Comments: ETT in place Cardiovascular:     Rate and Rhythm: Normal rate and regular rhythm.     Pulses: Normal pulses.     Heart sounds: Normal heart sounds.  Pulmonary:     Breath sounds: No rales.     Comments: Ventilated breath sounds anteriorly Musculoskeletal:     Right lower leg: No edema.     Left lower leg: No edema.  Neurological:     Comments: Awake, alert, follows commands      Assessment & Plan:   Neurology #Toxic Metabolic Encephalopathy #CO2 Narcosis  Altered mental status secondary to toxic metabolic encephalopathy from CO2 narcosis. Hypercapnia improved following intubation, now awake and follows commands with a RASS of 0.  -Maintain a RASS goal of 0 -Avoid sedating medications as able -Daily wake up assessment  Cardiovascular #Afib #HFpEF  No sign of volume overload, BNP at 53 on admission, troponin negative x2. Patient has maintained a normal blood pressure and she is in sinus rhythm. She's on Rivaroxaban for stroke prophylaxis which is continued.  Pulmonary #Acute on Chronic Hypoxic and Hypercapnic Respiratory Failure #COPD Exacerbation #Chronic Left Pleural Effusion  Admission with COPD exacerbation resulting in acute on chronic hypoxic and hypercapnic respiratory failure. Chest imaging with left sided infiltrate that appears chronic, with previous chest CT showing a left sided pleural effusion. Normal white count, Procalcitonin of < 0.10, and stable hemodynamics argue against an infected pleural space. Patient intubated and ventilated for respiratory  failure with improvement in her blood gas which we will continue to trend. Goal pH between 7.3 and 7.4 given chronic hypercapnia. COPD exacerbation treated with steroids, antibiotics, and nebulizers. Patient is receiving therapeutic anti-coagulation with rivaroxaban, less likely for VTE to be driving this presentation. Plan for SBT this AM and extubation to BIPAP.  -Full vent support, implement lung protective strategies -Plateau pressures less than 30 cm H20 -Wean FiO2 & PEEP as tolerated to maintain O2 sats >92% -Follow intermittent Chest X-ray & ABG as needed -Spontaneous Breathing Trials when respiratory parameters met and mental status permits -Implement VAP Bundle -Prn Bronchodilators -continue methylprednisolone 40 mg bid, switch to daily tomorrow -duonebs every 4 hours -extubate to BiPAP once ready -will need nocturnal BiPAP moving forward  Gastrointestinal  PPI for prophylaxis. Hold tube feeds   Renal  Kidney function at baseline, monitor electrolytes and replete as indicated. Avoid nephrotoxins.  Endocrine #Hypothyroidism #T2DM  History of hypothyroidism with TSH of 18.46 in 2023. She's on levothyroxine which is restarted at 200 mcg daily. Patient also receiving steroids and is maintained on ICU glycemic protocol.  Hem/Onc  Continue rivaroxaban, monitor hemoglobin and platelets. She is anti-coagulated and a CTA of the chest is unlikely to change her management. duplex to assess for VTE is negative.  ID #COPD Exacerbation  Antibiotics initiated for management of  COPD exacerbation. Continue Ceftriaxone and azithromycin for CAP coverage. Respiratory viral panel (including COVID) was negative.  Best Practice (right click and "Reselect all SmartList Selections" daily)   Diet/type: NPO DVT prophylaxis: DOAC GI prophylaxis: PPI Lines: N/A Foley:  Yes, and it is still needed Code Status:  full code Last date of multidisciplinary goals of care discussion  [08/18/2022]  Labs   CBC: Recent Labs  Lab 08/16/22 2017 08/17/22 0603 08/18/22 0510  WBC 4.8 4.4 4.4  NEUTROABS 3.6  --   --   HGB 12.8 10.7* 10.3*  HCT 45.9 36.5 32.6*  MCV 115.0* 108.6* 101.2*  PLT 125* 122* 93*    Basic Metabolic Panel: Recent Labs  Lab 08/16/22 2017 08/17/22 0603 08/18/22 0510  NA 143 142 138  K 4.3 4.4 4.3  CL 97* 100 102  CO2 41* 33* 30  GLUCOSE 118* 110* 126*  BUN 10 12 24*  CREATININE 0.67 0.75 0.75  CALCIUM 9.2 9.0 8.8*  MG  --  2.5* 2.3  PHOS  --  2.1* 3.8   GFR: Estimated Creatinine Clearance: 87.6 mL/min (by C-G formula based on SCr of 0.75 mg/dL). Recent Labs  Lab 08/16/22 2017 08/17/22 0603 08/18/22 0510  PROCALCITON <0.10 <0.10 <0.10  WBC 4.8 4.4 4.4    Liver Function Tests: Recent Labs  Lab 08/16/22 2017 08/18/22 0510  AST 15  --   ALT 11  --   ALKPHOS 95  --   BILITOT 0.5  --   PROT 7.2  --   ALBUMIN 4.0 3.3*   No results for input(s): "LIPASE", "AMYLASE" in the last 168 hours. No results for input(s): "AMMONIA" in the last 168 hours.  ABG    Component Value Date/Time   PHART 7.46 (H) 08/18/2022 0500   PCO2ART 49 (H) 08/18/2022 0500   PO2ART 90 08/18/2022 0500   HCO3 34.8 (H) 08/18/2022 0500   TCO2 34 (H) 09/12/2021 1634   ACIDBASEDEF 7.7 (H) 08/09/2019 0748   O2SAT 99 08/18/2022 0500     Coagulation Profile: No results for input(s): "INR", "PROTIME" in the last 168 hours.  Cardiac Enzymes: No results for input(s): "CKTOTAL", "CKMB", "CKMBINDEX", "TROPONINI" in the last 168 hours.  HbA1C: Hgb A1c MFr Bld  Date/Time Value Ref Range Status  08/16/2022 09:45 PM 4.9 4.8 - 5.6 % Final    Comment:    (NOTE) Pre diabetes:          5.7%-6.4%  Diabetes:              >6.4%  Glycemic control for   <7.0% adults with diabetes   09/11/2021 05:38 PM 4.9 4.8 - 5.6 % Final    Comment:    (NOTE) Pre diabetes:          5.7%-6.4%  Diabetes:              >6.4%  Glycemic control for   <7.0% adults with  diabetes     CBG: Recent Labs  Lab 08/17/22 0725 08/17/22 1126 08/17/22 1548 08/17/22 2343 08/18/22 0738  GLUCAP 119* 118* 145* 153* 147*    Review of Systems:   Unable to obtain  Past Medical History:  She,  has a past medical history of Anxiety, Asthma, CHF (congestive heart failure) (HCC), COPD (chronic obstructive pulmonary disease) (HCC), Diabetes mellitus without complication (HCC), Hypertension, and Thyroid disease.   Surgical History:   Past Surgical History:  Procedure Laterality Date   ABDOMINAL HYSTERECTOMY     THORACENTESIS Left 09/18/2021  Procedure: THORACENTESIS;  Surgeon: Lupita Leash, MD;  Location: [facility] ENDOSCOPY;  Service: Cardiopulmonary;  Laterality: Left;     Social History:   reports that she has been smoking cigarettes. She has a 10 pack-year smoking history. She has never used smokeless tobacco. She reports that she does not drink alcohol and does not use drugs.   Family History:  Her family history is not on file.   Allergies Allergies  Allergen Reactions   Estrogens Hives   Mustard Hives   Strawberry Extract Hives     Home Medications  Prior to Admission medications   Medication Sig Start Date End Date Taking? Authorizing Provider  fluconazole (DIFLUCAN) 100 MG tablet Take 100 mg by mouth daily. 08/08/22  Yes [provider]  ADVAIR DISKUS 500-50 MCG/ACT AEPB Inhale 1 puff into the lungs in the morning and at bedtime. 12/04/21   [provider]  albuterol (PROAIR HFA) 108 (90 Base) MCG/ACT inhaler 2 puffs every 4 hours as needed only  if your can't catch your breath 03/21/21   Laural Benes, Clanford L, MD  albuterol (PROVENTIL) (2.5 MG/3ML) 0.083% nebulizer solution Take 3 mLs (2.5 mg total) by nebulization every 6 (six) hours as needed for wheezing or shortness of breath. 03/21/21   Johnson, Clanford L, MD  atorvastatin (LIPITOR) 20 MG tablet Take 20 mg by mouth at bedtime.     [provider]  bethanechol  (URECHOLINE) 5 MG tablet Take 5 mg by mouth 2 (two) times daily. 08/06/19   [provider]  escitalopram (LEXAPRO) 20 MG tablet Take 20 mg by mouth daily. 10/25/21   [provider]  folic acid (FOLVITE) 1 MG tablet Take 1 tablet (1 mg total) by mouth daily. 10/11/19   Catarina Hartshorn, MD  furosemide (LASIX) 40 MG tablet Take 1 tablet (40 mg total) by mouth daily. 10/11/19   Catarina Hartshorn, MD  hydrocortisone cream 1 % Apply topically 2 (two) times daily. 09/25/21   Pokhrel, Rebekah Chesterfield, MD  hydrOXYzine (ATARAX) 10 MG tablet Take 1 tablet (10 mg total) by mouth 3 (three) times daily. 12/18/21   Enedina Finner, MD  levothyroxine (SYNTHROID) 200 MCG tablet Take 1 tablet (200 mcg total) by mouth daily at 6 (six) AM. 06/20/19   Swayze, Ava, DO  oxyCODONE-acetaminophen (PERCOCET) 10-325 MG tablet Take 1 tablet by mouth 4 (four) times daily. 09/06/21   [provider]  pantoprazole (PROTONIX) 40 MG tablet Take 1 tablet (40 mg total) by mouth daily. 06/20/19   Swayze, Ava, DO  STIOLTO RESPIMAT 2.5-2.5 MCG/ACT AERS Inhale 2 each into the lungs daily. 12/04/21   [provider]  SUMAtriptan (IMITREX) 50 MG tablet Take 50 mg by mouth every 2 (two) hours as needed for migraine. 07/31/21   [provider]  topiramate (TOPAMAX) 100 MG tablet Take 100 mg by mouth 2 (two) times daily. 06/01/20   [provider]  Vitamin D, Ergocalciferol, (DRISDOL) 1.25 MG (50000 UNIT) CAPS capsule Take 50,000 Units by mouth once a week. 10/02/21   [provider]  XARELTO 20 MG TABS tablet Take 20 mg by mouth daily. 05/08/19   [provider]     Critical care time: 66 minutes    Raechel Chute, MD [facility] 08/18/2022 10:09 AM

## 2022-08-18 NOTE — Consult Note (Signed)
PHARMACY CONSULT NOTE - ELECTROLYTES  Pharmacy Consult for Electrolyte Monitoring and Replacement   Recent Labs: Potassium (mmol/L)  Date Value  08/18/2022 4.3   Magnesium (mg/dL)  Date Value  21/30/8657 2.3   Calcium (mg/dL)  Date Value  84/69/6295 8.8 (L)   Albumin (g/dL)  Date Value  28/41/3244 3.3 (L)   Phosphorus (mg/dL)  Date Value  01/03/7251 3.8   Sodium (mmol/L)  Date Value  08/18/2022 138   Height: 5' 9.02" (175.3 cm) Weight: 93.5 kg (206 lb 2.1 oz) IBW/kg (Calculated) : 66.24 Estimated Creatinine Clearance: 87.6 mL/min (by C-G formula based on SCr of 0.75 mg/dL).  Assessment  Terri Casey is a 62 y.o. female presenting with acute on chronic respiratory failure. PMH significant for COPD, DM, HTN, HFpEF, hypothyroidism, PAF, anxiety, tobacco use disorder, asthma, OSA. Pharmacy has been consulted to monitor and replace electrolytes.  Diet: N/A MIVF: N/A Pertinent medications: N/A  Goal of Therapy: Electrolytes within normal limits  Plan:  No replacement needed.  F/u with AM labs.   Thank you for allowing pharmacy to be a part of this patient's care.  Ronnald Ramp, PharmD 08/18/2022 7:42 AM

## 2022-08-19 DIAGNOSIS — J9621 Acute and chronic respiratory failure with hypoxia: Secondary | ICD-10-CM

## 2022-08-19 DIAGNOSIS — J9622 Acute and chronic respiratory failure with hypercapnia: Secondary | ICD-10-CM | POA: Diagnosis not present

## 2022-08-19 LAB — GLUCOSE, CAPILLARY
Glucose-Capillary: 107 mg/dL — ABNORMAL HIGH (ref 70–99)
Glucose-Capillary: 114 mg/dL — ABNORMAL HIGH (ref 70–99)
Glucose-Capillary: 117 mg/dL — ABNORMAL HIGH (ref 70–99)
Glucose-Capillary: 136 mg/dL — ABNORMAL HIGH (ref 70–99)
Glucose-Capillary: 139 mg/dL — ABNORMAL HIGH (ref 70–99)
Glucose-Capillary: 68 mg/dL — ABNORMAL LOW (ref 70–99)
Glucose-Capillary: 89 mg/dL (ref 70–99)
Glucose-Capillary: 93 mg/dL (ref 70–99)
Glucose-Capillary: 94 mg/dL (ref 70–99)
Glucose-Capillary: 95 mg/dL (ref 70–99)
Glucose-Capillary: 95 mg/dL (ref 70–99)
Glucose-Capillary: 97 mg/dL (ref 70–99)

## 2022-08-19 LAB — RENAL FUNCTION PANEL
Albumin: 3.5 g/dL (ref 3.5–5.0)
Anion gap: 9 (ref 5–15)
BUN: 19 mg/dL (ref 8–23)
CO2: 29 mmol/L (ref 22–32)
Calcium: 8.9 mg/dL (ref 8.9–10.3)
Chloride: 102 mmol/L (ref 98–111)
Creatinine, Ser: 0.68 mg/dL (ref 0.44–1.00)
GFR, Estimated: >60 mL/min (ref >60)
Glucose, Bld: 97 mg/dL (ref 70–99)
Phosphorus: 3.4 mg/dL (ref 2.5–4.6)
Potassium: 4.2 mmol/L (ref 3.5–5.1)
Sodium: 140 mmol/L (ref 135–145)

## 2022-08-19 LAB — IRON AND TIBC
Iron: 38 ug/dL (ref 28–170)
Saturation Ratios: 11 % (ref 10.4–31.8)
TIBC: 342 ug/dL (ref 250–450)
UIBC: 304 ug/dL

## 2022-08-19 LAB — CBC
HCT: 34.2 % — ABNORMAL LOW (ref 36.0–46.0)
Hemoglobin: 10.7 g/dL — ABNORMAL LOW (ref 12.0–15.0)
MCH: 31.8 pg (ref 26.0–34.0)
MCHC: 31.3 g/dL (ref 30.0–36.0)
MCV: 101.8 fL — ABNORMAL HIGH (ref 80.0–100.0)
Platelets: 119 10*3/uL — ABNORMAL LOW (ref 150–400)
RBC: 3.36 MIL/uL — ABNORMAL LOW (ref 3.87–5.11)
RDW: 14.3 % (ref 11.5–15.5)
WBC: 8.7 10*3/uL (ref 4.0–10.5)
nRBC: 0 % (ref 0.0–0.2)

## 2022-08-19 LAB — FOLATE: Folate: 25 ng/mL (ref >5.9)

## 2022-08-19 LAB — VITAMIN B12: Vitamin B-12: 150 pg/mL — ABNORMAL LOW (ref 180–914)

## 2022-08-19 LAB — MAGNESIUM: Magnesium: 2.1 mg/dL (ref 1.7–2.4)

## 2022-08-19 MED ORDER — PANTOPRAZOLE SODIUM 40 MG PO TBEC
40.0000 mg | DELAYED_RELEASE_TABLET | Freq: Every day | ORAL | Status: DC
  Administered 2022-08-19 – 2022-08-24 (×6): 40 mg via ORAL
  Filled 2022-08-19 (×6): qty 1

## 2022-08-19 MED ORDER — CYANOCOBALAMIN 1000 MCG/ML IJ SOLN
1000.0000 ug | Freq: Every day | INTRAMUSCULAR | Status: DC
  Administered 2022-08-19 – 2022-08-24 (×5): 1000 ug via INTRAMUSCULAR
  Filled 2022-08-19 (×6): qty 1

## 2022-08-19 MED ORDER — GUAIFENESIN ER 600 MG PO TB12
600.0000 mg | ORAL_TABLET | Freq: Two times a day (BID) | ORAL | Status: DC
  Administered 2022-08-19 – 2022-08-24 (×10): 600 mg via ORAL
  Filled 2022-08-19 (×10): qty 1

## 2022-08-19 MED ORDER — VITAMIN B-12 1000 MCG PO TABS
1000.0000 ug | ORAL_TABLET | Freq: Every day | ORAL | Status: DC
  Administered 2022-08-21: 1000 ug via ORAL

## 2022-08-19 MED ORDER — FLUTICASONE FUROATE-VILANTEROL 200-25 MCG/ACT IN AEPB
1.0000 | INHALATION_SPRAY | Freq: Every day | RESPIRATORY_TRACT | Status: DC
  Administered 2022-08-19 – 2022-08-24 (×6): 1 via RESPIRATORY_TRACT
  Filled 2022-08-19 (×2): qty 28

## 2022-08-19 MED ORDER — FLUCONAZOLE 50 MG PO TABS
150.0000 mg | ORAL_TABLET | ORAL | Status: AC
  Administered 2022-08-19 – 2022-08-22 (×2): 150 mg via ORAL
  Filled 2022-08-19 (×2): qty 1

## 2022-08-19 MED ORDER — HYDROCOD POLI-CHLORPHE POLI ER 10-8 MG/5ML PO SUER: 5.0000 mL | Freq: Two times a day (BID) | ORAL | Status: DC | PRN

## 2022-08-19 NOTE — Progress Notes (Signed)
[facility] Progress Note  Patient: Terri Casey    WUJ:[id number]  DOA: 08/16/2022     Date of Service: the patient was seen and examined on 08/19/2022  Chief Complaint  Patient presents with   Shortness of Breath   Brief hospital course: 62 yo F presenting to [facility] ED on 08/16/22 for evaluation of worsening dyspnea.   History obtained via chart review and telephone report from daughter-in-law, Terri Casey. Daughter-in-law describes the patient as being in her baseline state of health until 08/13/2022.  The patient has had progressive shortness of breath and poor p.o. intake.  On the morning of 08/16/2022 she seemed more dyspneic and lethargic, she described her lips as "changing color".  They increased her nasal cannula oxygen to 5 L from 3-4, and the patient appeared to recover.  Later in the evening she had a similar episode becoming increasingly dyspneic, lethargic with changes to her coloration.  Daughter-in-law called EMS when she noted her SpO2 to be in the 70's. ED course: Upon arrival patient was lethargic placed on BIPAP support, patient had mild tachypnea but otherwise vital signs stable on 40% FiO2.  At first the patient appeared to improve on BiPAP support but then became increasingly agitated and confused prompting urgent intubation and mechanical ventilatory support.  Labs significant for respiratory acidosis and elevated MCV with mild thrombocytopenia.  (At baseline the patient has macrocytic anemia) Medications given: Duo neb x 3, solu medrol 125 mg, 2 g Mg, fentanyl/ succinylcholine/ etomidate, 1 L NS bolus, propofol drip   CXR 08/16/22: Mild Lingular scarring/atelectasis, no other cardiopulmonary abnormalities. CT head wo contrast 08/16/22: No acute intracranial abnormality   PCCM consulted for admission due to acute on chronic hypoxic/hypercapnic respiratory failure secondary to AECOPD requiring urgent intubation and mechanical ventilatory support.  Patient was extubated  on 08/18/2022 and downgraded under TRH on 08/19/2022  Assessment and Plan:  # Toxic Metabolic Encephalopathy # CO2 Narcosis Altered mental status secondary to toxic metabolic encephalopathy from CO2 narcosis. Hypercapnia improved following intubation, Currently patient is back to her baseline   #Acute on Chronic Hypoxic and Hypercapnic Respiratory Failure #COPD Exacerbation #Chronic Left Pleural Effusion CT chest: left sided infiltrate that appears chronic, with previous chest CT showing a left sided pleural effusion.  Normal white count, Procalcitonin of < 0.10, and stable hemodynamics argue against an infected pleural space.  Patient was extubated on 8/17, passed swallowing screen, resumed diet Use BiPAP as needed Continue DuoNeb every 6 hourly, started Prolopa inhaler Continue antibiotics ceftriaxone and azithromycin for possible CAP coverage Continue Solu-Medrol 40 mg IV daily, transition to prednisone as per improvement  PA-fib, HFpEF, HLD Continue Xarelto and statin Lasix was held on admission Monitor volume status. Continue telemetry   # Hyperglycemia most likely due to steroids.  No history of diabetes # Hypothyroid, continue Synthroid, follow with PCP to monitor TSH level and titrate dose accordingly # Depression, continue Lexapro # Vaginal yeast infection, started Diflucan 150 mg p.o. every 72 hours x 2 doses  # Vitamin B12 deficiency: Started vitamin B12 1000 mcg IM injection daily during hospital stay, followed by oral supplement.  Follow-up PCP to repeat vitamin B12 level after 3 to 6 months.  Body mass index is 32.02 kg/m.  Nutrition Problem: Inadequate oral intake Etiology: inability to eat (pt sedated and ventilated) Interventions:  Diet: Heart healthy diet DVT Prophylaxis: Therapeutic Anticoagulation with Xarelto    Advance goals of care discussion: Full code  Family Communication: family was not present at bedside, at  the time of interview.  The pt  provided permission to discuss medical plan with the family. Opportunity was given to ask question and all questions were answered satisfactorily.   Disposition:  Pt is from Home, admitted with Resp failure s/p Intubation, still has COPD exacerbation, which precludes a safe discharge. Discharge to home, when stable.  Subjective: No significant events overnight, patient was extubated yesterday, still has mild shortness of breath, denies any chest pain or palpitation, no any other active issues.  Physical Exam: General: NAD, lying comfortably Appear in no distress, affect appropriate Eyes: PERRLA ENT: Oral Mucosa Clear, moist  Neck: no JVD,  Cardiovascular: S1 and S2 Present, no Murmur,  Respiratory: Good air entry bilaterally, mild pallor bilateral crackles and mild wheezing. Abdomen: Bowel Sound present, Soft and no tenderness,  Skin: no rashes Extremities: no Pedal edema, no calf tenderness Neurologic: without any new focal findings Gait not checked due to patient safety concerns  Vitals:   08/19/22 0800 08/19/22 0900 08/19/22 1000 08/19/22 1100  BP: 135/74 128/68 137/83 137/74  Pulse: 83 86 93 89  Resp: 16 19 20 19   Temp: 100 F (37.8 C) 100 F (37.8 C) 99.9 F (37.7 C) 99.7 F (37.6 C)  TempSrc: Bladder     SpO2: 98% 94% 90% 96%  Weight:      Height:        Intake/Output Summary (Last 24 hours) at 08/19/2022 1622 Last data filed at 08/19/2022 0200 Gross per 24 hour  Intake 1181.78 ml  Output 830 ml  Net 351.78 ml   Filed Weights   08/17/22 0500 08/18/22 0500 08/19/22 0500  Weight: 93.5 kg 93.5 kg 98.4 kg    Data Reviewed: I have personally reviewed and interpreted daily labs, tele strips, imagings as discussed above. I reviewed all nursing notes, pharmacy notes, vitals, pertinent old records I have discussed plan of care as described above with RN and patient/family.  CBC: Recent Labs  Lab 08/16/22 2017 08/17/22 0603 08/18/22 0510 08/19/22 0550  WBC 4.8  4.4 4.4 8.7  NEUTROABS 3.6  --   --   --   HGB 12.8 10.7* 10.3* 10.7*  HCT 45.9 36.5 32.6* 34.2*  MCV 115.0* 108.6* 101.2* 101.8*  PLT 125* 122* 93* 119*   Basic Metabolic Panel: Recent Labs  Lab 08/16/22 2017 08/17/22 0603 08/18/22 0510 08/19/22 0550  NA 143 142 138 140  K 4.3 4.4 4.3 4.2  CL 97* 100 102 102  CO2 41* 33* 30 29  GLUCOSE 118* 110* 126* 97  BUN 10 12 24* 19  CREATININE 0.67 0.75 0.75 0.68  CALCIUM 9.2 9.0 8.8* 8.9  MG  --  2.5* 2.3 2.1  PHOS  --  2.1* 3.8 3.4    Studies: No results found.  Scheduled Meds:  atorvastatin  20 mg Oral QHS   Chlorhexidine Gluconate Cloth  6 each Topical Daily   escitalopram  20 mg Oral Daily   fluconazole  150 mg Oral Q72H   fluticasone furoate-vilanterol  1 puff Inhalation Daily   folic acid  1 mg Oral Daily   insulin aspart  0-9 Units Subcutaneous Q4H   ipratropium-albuterol  3 mL Nebulization Q6H   levothyroxine  200 mcg Oral Q0600   methylPREDNISolone (SOLU-MEDROL) injection  40 mg Intravenous Q24H   rivaroxaban  20 mg Oral Q supper   Continuous Infusions:  sodium chloride Stopped (08/17/22 1038)   azithromycin Stopped (08/19/22 0139)   cefTRIAXone (ROCEPHIN)  IV 200 mL/hr at 08/19/22 0200  PRN Meds: docusate sodium, ipratropium-albuterol, polyethylene glycol  Time spent: 35 minutes  Author: Gillis Santa. MD [facility] 08/19/2022 4:22 PM  To reach On-call, see care teams to locate the attending and reach out to them via [email or web address]. If 7PM-7AM, please contact night-coverage If you still have difficulty reaching the attending provider, please page the [facility] (Director on Call) for [facility] on amion for assistance.

## 2022-08-19 NOTE — Evaluation (Signed)
Clinical/Bedside Swallow Evaluation Patient Details  Name: Terri Casey MRN: [id number] Date of Birth: [birth year]  Today's Date: 08/19/2022 Time: SLP Start Time (ACUTE ONLY): 1640 SLP Stop Time (ACUTE ONLY): 1720 SLP Time Calculation (min) (ACUTE ONLY): 40 min  Past Medical History:  Past Medical History:  Diagnosis Date   Anxiety    Asthma    CHF (congestive heart failure) (HCC)    COPD (chronic obstructive pulmonary disease) (HCC)    Diabetes mellitus without complication (HCC)    Hypertension    Thyroid disease    Past Surgical History:  Past Surgical History:  Procedure Laterality Date   ABDOMINAL HYSTERECTOMY     THORACENTESIS Left 09/18/2021   Procedure: THORACENTESIS;  Surgeon: Lupita Leash, MD;  Location: [facility] ENDOSCOPY;  Service: Cardiopulmonary;  Laterality: Left;   HPI:  Pt is a 62 y.o. female w/ PMH including COPD, HTN, Tobacco use/abuse, PAF, HPfEF, OSA here with shortness of breath.  History provided primarily by family and EMS.  Per report, the patient's had increasing shortness of breath over the last 4 days.  She has not extensive history of COPD with recurrent hypercapnic respiratory failure.  She does not always use her breathing treatments and home medications as indicated.  She has been increasingly drowsy throughout the day of admit.  Upon arrival patient was lethargic placed on BIPAP support, patient had mild tachypnea but otherwise vital signs stable on 40% FiO2.  At first the patient appeared to improve on BiPAP support but then became increasingly agitated and confused prompting urgent intubation and mechanical ventilatory support.  Labs significant for respiratory acidosis and elevated MCV with mild thrombocytopenia.  (At baseline the patient has macrocytic anemia)       CXR 08/16/22: Mild Lingular scarring/atelectasis, no other cardiopulmonary abnormalities.  CT head wo contrast 08/16/22: No acute intracranial abnormality.  Pt admitted due to acute on  chronic hypoxic/hypercapnic respiratory failure secondary to AECOPD requiring urgent intubation and mechanical ventilatory support.  Per chart, patient lives with her daughter-in-law and son, they both feel the patient would benefit from being in a skilled nursing facility.    Assessment / Plan / Recommendation  Clinical Impression   Pt seen for BSE today. Pt awake, verbal and engaged easily w/ this SLP. NSG reported pt ate lunch w/out overt difficulty noted.  On [place] O2 support 2-4L; afebrile. WBC WNL.   Pt appears to present w/ functional oropharyngeal phase swallowing w/ No oropharyngeal phase dysphagia noted, No neuromuscular deficits noted. Pt consumed po trials w/ No overt, clinical s/s of aspiration during po trials. Pt appears at reduced risk for aspiration following general aspiration precautions.  However, pt does have challenging factors that could impact her oropharyngeal swallowing to include recent oral intubation(extubated 08/18/22 post ~2 days) and deconditioning/weakness. These factors can increase risk for aspiration, dysphagia as well as decreased oral intake overall.  During po trials, pt consumed all consistencies w/ no overt coughing, decline in vocal quality, or change in respiratory presentation during/post trials. O2 sats remained 97%. Oral phase appeared [facility] w/ timely bolus management, mastication, and control of bolus propulsion for A-P transfer for swallowing. Oral clearing achieved w/ all trial consistencies -- moistened, soft foods given.  OM Exam appeared [facility] w/ no unilateral weakness noted. Speech Clear. Pt fed self w/ setup support.   Recommend continue a Regular consistency diet w/ well-Cut meats, moistened foods; Thin liquids -- monitor straw use. Recommend general aspiration precautions, including sitting fully upright w/ any oral intake and  taking Rest Breaks during meals to avoid any SOB/WOB w/ oral intake. Pills WHOLE in Puree for safer, easier swallowing IF ANY  difficulty noted when swallowing w/ liquids. Denture care daily. Education given on Pills in Puree; food consistencies and easy to eat options; general aspiration precautions to pt and NSG. NSG to reconsult if any new needs arise. NSG updated, agreed. MD updated. Recommend Dietician f/u for support. SLP Visit Diagnosis: Dysphagia, unspecified (R13.10)    Aspiration Risk   (reduced following general aspiration precautions)    Diet Recommendation   Thin;Age appropriate regular (moistened foods) = a Regular consistency diet w/ well-Cut meats, moistened foods; Thin liquids -- monitor straw use. Recommend general aspiration precautions, including sitting fully upright w/ any oral intake and taking Rest Breaks during meals to avoid any SOB/WOB w/ oral intake.   Medication Administration: Whole meds with liquid (vs in a puree if needed per NSG)    Other  Recommendations Recommended Consults:  (Dietician as needed) Oral Care Recommendations: Oral care BID;Oral care before and after PO;Staff/trained caregiver to provide oral care (Denture care)    Recommendations for follow up therapy are one component of a multi-disciplinary discharge planning process, led by the attending physician.  Recommendations may be updated based on patient status, additional functional criteria and insurance authorization.  Follow up Recommendations No SLP follow up      Assistance Recommended at Discharge  Intermittent for Denture care  Functional Status Assessment Patient has had a recent decline in their functional status and demonstrates the ability to make significant improvements in function in a reasonable and predictable amount of time.  Frequency and Duration  (n/a)   (n/a)       Prognosis Prognosis for improved oropharyngeal function: Good Barriers to Reach Goals:  (n/a) Barriers/Prognosis Comment: weakness w/ hospitalization      Swallow Study   General Date of Onset: 08/16/22 HPI: Pt is a 62 y.o.  female w/ PMH including COPD, HTN, Tobacco use/abuse, PAF, HPfEF, OSA here with shortness of breath.  History provided primarily by family and EMS.  Per report, the patient's had increasing shortness of breath over the last 4 days.  She has not extensive history of COPD with recurrent hypercapnic respiratory failure.  She does not always use her breathing treatments and home medications as indicated.  She has been increasingly drowsy throughout the day of admit.  Upon arrival patient was lethargic placed on BIPAP support, patient had mild tachypnea but otherwise vital signs stable on 40% FiO2.  At first the patient appeared to improve on BiPAP support but then became increasingly agitated and confused prompting urgent intubation and mechanical ventilatory support.  Labs significant for respiratory acidosis and elevated MCV with mild thrombocytopenia.  (At baseline the patient has macrocytic anemia)       CXR 08/16/22: Mild Lingular scarring/atelectasis, no other cardiopulmonary abnormalities.  CT head wo contrast 08/16/22: No acute intracranial abnormality.  Pt admitted due to acute on chronic hypoxic/hypercapnic respiratory failure secondary to AECOPD requiring urgent intubation and mechanical ventilatory support.  Per chart, patient lives with her daughter-in-law and son, they both feel the patient would benefit from being in a skilled nursing facility. Type of Study: Bedside Swallow Evaluation Previous Swallow Assessment: 2021; 2023 -- regular diet each time Diet Prior to this Study: Regular;Thin liquids (Level 0) Temperature Spikes Noted: No (wbc WNL, 8.7) Respiratory Status: Nasal cannula (2-4L) History of Recent Intubation: Yes Total duration of intubation (days): 2 days Date extubated: 08/18/22 Behavior/Cognition: Alert;Cooperative;Pleasant  mood;Requires cueing;Distractible Oral Cavity Assessment: Within Functional Limits Oral Care Completed by SLP: Yes Oral Cavity - Dentition: Dentures,  top;Dentures, bottom Vision: Functional for self-feeding Self-Feeding Abilities: Able to feed self;Needs set up Patient Positioning: Upright in bed (needed positioning support) Baseline Vocal Quality: Normal Volitional Cough: Strong;Congested Volitional Swallow: Able to elicit    Oral/Motor/Sensory Function Overall Oral Motor/Sensory Function: Within functional limits   Ice Chips Ice chips: Not tested   Thin Liquid Thin Liquid: Within functional limits Presentation: Cup;Self Fed;Straw (~4-5 ozs total) Other Comments: water, juice    Nectar Thick Nectar Thick Liquid: Not tested   Honey Thick Honey Thick Liquid: Not tested   Puree Puree: Within functional limits Presentation: Spoon;Self Fed (5 trials)   Solid     Solid: Within functional limits Presentation: Self Fed (graham crackers)        Jerilynn Som, MS, CCC-SLP Speech Language Pathologist Rehab Services; [facility] [phone] (ascom) Mareon Robinette 08/19/2022,6:07 PM

## 2022-08-19 NOTE — TOC Progression Note (Signed)
Transition of Care [facility]) - Progression Note    Patient Details  Name: Terri Casey MRN: [id number] Date of Birth: [birth year]  Transition of [facility] CM/SW Contact  Colette Ribas, [place] Phone Number: 08/19/2022, 2:35 PM  Clinical Narrative:      CSW spoke with patien's son, advised we need POA information, and that he can come up here and have the chaplain assist with the process he advised he will be here 8/18 sometime.      Expected Discharge Plan and Services                                               Social Determinants of Health (SDOH) Interventions SDOH Screenings   Tobacco Use: High Risk (08/16/2022)    Readmission Risk Interventions    09/14/2021    3:21 PM 03/21/2021   10:52 AM 03/20/2021   12:14 PM  Readmission Risk Prevention Plan  Post Dischage Appt  Complete   Medication Screening  Complete Complete  Transportation Screening  Complete Complete  PCP or Specialist Appt within 3-5 Days Complete    HRI or Home Care Consult Complete    Social Work Consult for Recovery Care Planning/Counseling Complete    Palliative Care Screening Not Applicable    Medication Review Oceanographer) Complete

## 2022-08-20 DIAGNOSIS — J9622 Acute and chronic respiratory failure with hypercapnia: Secondary | ICD-10-CM | POA: Diagnosis not present

## 2022-08-20 DIAGNOSIS — J9621 Acute and chronic respiratory failure with hypoxia: Secondary | ICD-10-CM | POA: Diagnosis not present

## 2022-08-20 LAB — CBC
HCT: 26.7 % — ABNORMAL LOW (ref 36.0–46.0)
Hemoglobin: 8.7 g/dL — ABNORMAL LOW (ref 12.0–15.0)
MCH: 32 pg (ref 26.0–34.0)
MCHC: 32.6 g/dL (ref 30.0–36.0)
MCV: 98.2 fL (ref 80.0–100.0)
Platelets: 103 10*3/uL — ABNORMAL LOW (ref 150–400)
RBC: 2.72 MIL/uL — ABNORMAL LOW (ref 3.87–5.11)
RDW: 14.6 % (ref 11.5–15.5)
WBC: 6.8 10*3/uL (ref 4.0–10.5)
nRBC: 0 % (ref 0.0–0.2)

## 2022-08-20 LAB — BASIC METABOLIC PANEL
Anion gap: 7 (ref 5–15)
BUN: 15 mg/dL (ref 8–23)
CO2: 30 mmol/L (ref 22–32)
Calcium: 8.7 mg/dL — ABNORMAL LOW (ref 8.9–10.3)
Chloride: 106 mmol/L (ref 98–111)
Creatinine, Ser: 0.83 mg/dL (ref 0.44–1.00)
GFR, Estimated: >60 mL/min (ref >60)
Glucose, Bld: 93 mg/dL (ref 70–99)
Potassium: 3.9 mmol/L (ref 3.5–5.1)
Sodium: 143 mmol/L (ref 135–145)

## 2022-08-20 LAB — GLUCOSE, CAPILLARY
Glucose-Capillary: 91 mg/dL (ref 70–99)
Glucose-Capillary: 82 mg/dL (ref 70–99)
Glucose-Capillary: 102 mg/dL — ABNORMAL HIGH (ref 70–99)
Glucose-Capillary: 116 mg/dL — ABNORMAL HIGH (ref 70–99)
Glucose-Capillary: 117 mg/dL — ABNORMAL HIGH (ref 70–99)
Glucose-Capillary: 91 mg/dL (ref 70–99)

## 2022-08-20 LAB — TRIGLYCERIDES: Triglycerides: 88 mg/dL (ref <150)

## 2022-08-20 LAB — MAGNESIUM: Magnesium: 2.2 mg/dL (ref 1.7–2.4)

## 2022-08-20 LAB — PHOSPHORUS: Phosphorus: 3.7 mg/dL (ref 2.5–4.6)

## 2022-08-20 MED ORDER — CITALOPRAM HYDROBROMIDE 20 MG PO TABS
20.0000 mg | ORAL_TABLET | Freq: Every day | ORAL | Status: DC
  Administered 2022-08-21 – 2022-08-22 (×2): 20 mg via ORAL
  Filled 2022-08-20 (×2): qty 1

## 2022-08-20 MED ORDER — PREDNISONE 20 MG PO TABS
30.0000 mg | ORAL_TABLET | Freq: Every day | ORAL | Status: AC
  Administered 2022-08-22 – 2022-08-24 (×3): 30 mg via ORAL
  Filled 2022-08-20 (×3): qty 1

## 2022-08-20 MED ORDER — PREDNISONE 10 MG PO TABS: 10.0000 mg | ORAL_TABLET | Freq: Every day | ORAL | Status: DC

## 2022-08-20 MED ORDER — METHYLPREDNISOLONE SODIUM SUCC 40 MG IJ SOLR
40.0000 mg | INTRAMUSCULAR | Status: AC
  Administered 2022-08-21: 40 mg via INTRAVENOUS
  Filled 2022-08-20: qty 1

## 2022-08-20 MED ORDER — ENSURE ENLIVE PO LIQD
237.0000 mL | Freq: Two times a day (BID) | ORAL | Status: DC
  Administered 2022-08-20 – 2022-08-24 (×5): 237 mL via ORAL

## 2022-08-20 MED ORDER — ADULT MULTIVITAMIN W/MINERALS CH
1.0000 | ORAL_TABLET | Freq: Every day | ORAL | Status: DC
  Administered 2022-08-21 – 2022-08-24 (×4): 1 via ORAL
  Filled 2022-08-20 (×4): qty 1

## 2022-08-20 MED ORDER — SACCHAROMYCES BOULARDII 250 MG PO CAPS
250.0000 mg | ORAL_CAPSULE | Freq: Two times a day (BID) | ORAL | Status: DC
  Administered 2022-08-20 – 2022-08-24 (×8): 250 mg via ORAL
  Filled 2022-08-20 (×8): qty 1

## 2022-08-20 MED ORDER — PREDNISONE 20 MG PO TABS: 20.0000 mg | ORAL_TABLET | Freq: Every day | ORAL | Status: DC

## 2022-08-20 NOTE — Progress Notes (Signed)
   08/20/22 1400  Spiritual Encounters  Type of Visit Initial  Care provided to: Patient  Referral source Chaplain assessment  OnCall Visit No  Spiritual Framework  Presenting Themes Meaning/purpose/sources of inspiration;Goals in life/care  Patient Stress Factors None identified  Family Stress Factors None identified  Interventions  Spiritual Care Interventions Made Compassionate presence;Established relationship of care and support  Intervention Outcomes  Outcomes Connection to spiritual care  Spiritual Care Plan  Spiritual Care Issues Still Outstanding Chaplain will continue to follow   Chaplain received spiritual consult for a AD. Patient stated that her son and daughter had the paperwork and that they would bring it back to have it signed. Chaplain asked the patient to have the nurses page chaplain services to have the paperwork completed.

## 2022-08-20 NOTE — Evaluation (Signed)
Occupational Therapy Evaluation Patient Details Name: Terri Casey MRN: [id number] DOB: January 18, 1960 Today's Date: 08/20/2022   History of Present Illness Patient is a 62 year old female with shortness of breath. 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 Extubated 08/18/22   Clinical Impression   Ms Barbas was seen for OT evaluation this date. Prior to hospital admission, pt was IND for limited household mobility, does not use her RW per family, utilizes furniture or HHA. Pt lives with son and daughter in law and extended family. Pt currently requires MAX A pericare standing, CGA bed>chair step pivot t/f. SETUP don/doff gown in sitting. Pt would benefit from skilled OT to address noted impairments and functional limitations (see below for any additional details). Upon hospital discharge, recommend OT follow up.    If plan is discharge home, recommend the following: A little help with walking and/or transfers;A little help with bathing/dressing/bathroom;Help with stairs or ramp for entrance    Functional Status Assessment  Patient has had a recent decline in their functional status and demonstrates the ability to make significant improvements in function in a reasonable and predictable amount of time.  Equipment Recommendations  None recommended by OT    Recommendations for Other Services       Precautions / Restrictions Precautions Precautions: Fall Restrictions Weight Bearing Restrictions: No      Mobility Bed Mobility Overal bed mobility: Needs Assistance Bed Mobility: Supine to Sit     Supine to sit: Contact guard, HOB elevated          Transfers Overall transfer level: Needs assistance Equipment used: None Transfers: Sit to/from Stand, Bed to chair/wheelchair/BSC Sit to Stand: Contact guard assist     Step pivot transfers: Contact guard assist             Balance Overall balance assessment: Needs assistance Sitting-balance support: Feet supported Sitting balance-Leahy Scale: Fair     Standing balance support: Single extremity supported Standing balance-Leahy Scale: Fair                             ADL either performed or assessed with clinical judgement   ADL Overall ADL's : Needs assistance/impaired                                       General ADL Comments: MAX A pericare standing, CGA simulated BSC t/f.      Pertinent Vitals/Pain Pain Assessment Pain Assessment: Faces Faces Pain Scale: Hurts a little bit Pain Location: back and L shoulder Pain Descriptors / Indicators: Discomfort Pain Intervention(s): Limited activity within patient's tolerance     Extremity/Trunk Assessment Upper Extremity Assessment Upper Extremity Assessment: Generalized weakness   Lower Extremity Assessment Lower Extremity Assessment: Generalized weakness       Communication Communication Communication: No apparent difficulties   Cognition Arousal: Alert Behavior During Therapy: WFL for tasks assessed/performed Overall Cognitive Status: Impaired/Different from baseline Area of Impairment: Memory, Following commands, Orientation                 Orientation Level: Disoriented to, Situation, Time   Memory: Decreased short-term memory Following Commands: Follows one step commands consistently       General Comments: when asked about home setup pt states "this was my room," indicating hospital room  Home Living Family/patient expects to be discharged to:: Private residence Living Arrangements: Children Available Help at Discharge: Family;Available PRN/intermittently;Available 24 hours/day Type of Home: House Home Access: Stairs to enter [employer] of Steps: 3-4   Home Layout: One level     Bathroom Shower/Tub: Chief Strategy Officer: Handicapped height      Home Equipment: Teacher, English as a foreign language (2 wheels);Toilet riser;Cane - single point;Shower seat;Hospital bed   Additional Comments: 3L O2 at baseline      Prior Functioning/Environment Prior Level of Function : Needs assist             Mobility Comments: usually independent with mobility, ambulating short distance ~1ft at baseline (to the porch to smoke and back to bed). son reports increased difficulty recently with getting up and down. no recent falls ADLs Comments: PRN assist from daughter in law        OT Problem List: Decreased strength;Decreased range of motion;Decreased activity tolerance;Decreased safety awareness      OT Treatment/Interventions: Self-care/ADL training;Therapeutic exercise;Energy conservation;DME and/or AE instruction;Therapeutic activities;Balance training    OT Goals(Current goals can be found in the care plan section) Acute Rehab OT Goals Patient Stated Goal: to go home OT Goal Formulation: With patient/family Time For Goal Achievement: 09/03/22 Potential to Achieve Goals: Good ADL Goals Pt Will Perform Grooming: with modified independence;standing Pt Will Perform Lower Body Dressing: with modified independence;sit to/from stand Pt Will Transfer to Toilet: with modified independence;ambulating;regular height toilet  OT Frequency: Min 1X/week    Co-evaluation              AM-PAC OT "6 Clicks" Daily Activity     Outcome Measure Help from another person eating meals?: None Help from another person taking care of personal grooming?: A Little Help from another person toileting, which includes using toliet, bedpan, or urinal?: A Lot Help from another person bathing (including washing, rinsing, drying)?: A Little Help from another person to put on and taking off regular upper body clothing?: A Little Help from another person to put on and taking off regular lower body clothing?: A Lot 6 Click Score: 17   End of Session Equipment Utilized  During Treatment: Oxygen Nurse Communication: Mobility status  Activity Tolerance: Patient tolerated treatment well Patient left: in chair;with call bell/phone within reach;with family/visitor present  OT Visit Diagnosis: Other abnormalities of gait and mobility (R26.89);Muscle weakness (generalized) (M62.81)                Time: 1610-9604 OT Time Calculation (min): 23 min Charges:  OT General Charges $OT Visit: 1 Visit OT Evaluation $OT Eval Moderate Complexity: 1 Mod OT Treatments $Self Care/Home Management : 8-22 mins  Kathie Dike, M.S. OTR/L  08/20/22, 12:00 PM  ascom [phone]

## 2022-08-20 NOTE — Progress Notes (Signed)
[facility] Progress Note  Patient: Terri Casey    VWU:[id number]  DOA: 08/16/2022     Date of Service: the patient was seen and examined on 08/20/2022  Chief Complaint  Patient presents with   Shortness of Breath   Brief hospital course: 62 yo F presenting to [facility] ED on 08/16/22 for evaluation of worsening dyspnea.   History obtained via chart review and telephone report from daughter-in-law, [name]. Daughter-in-law describes the patient as being in her baseline state of health until 08/13/2022.  The patient has had progressive shortness of breath and poor p.o. intake.  On the morning of 08/16/2022 she seemed more dyspneic and lethargic, she described her lips as "changing color".  They increased her nasal cannula oxygen to 5 L from 3-4, and the patient appeared to recover.  Later in the evening she had a similar episode becoming increasingly dyspneic, lethargic with changes to her coloration.  Daughter-in-law called EMS when she noted her SpO2 to be in the 70's. ED course: Upon arrival patient was lethargic placed on BIPAP support, patient had mild tachypnea but otherwise vital signs stable on 40% FiO2.  At first the patient appeared to improve on BiPAP support but then became increasingly agitated and confused prompting urgent intubation and mechanical ventilatory support.  Labs significant for respiratory acidosis and elevated MCV with mild thrombocytopenia.  (At baseline the patient has macrocytic anemia) Medications given: Duo neb x 3, solu medrol 125 mg, 2 g Mg, fentanyl/ succinylcholine/ etomidate, 1 L NS bolus, propofol drip   CXR 08/16/22: Mild Lingular scarring/atelectasis, no other cardiopulmonary abnormalities. CT head wo contrast 08/16/22: No acute intracranial abnormality   PCCM consulted for admission due to acute on chronic hypoxic/hypercapnic respiratory failure secondary to AECOPD requiring urgent intubation and mechanical ventilatory support.  Patient was extubated  on 08/18/2022 and downgraded under TRH on 08/19/2022  Assessment and Plan:  # Toxic Metabolic Encephalopathy # CO2 Narcosis Altered mental status secondary to toxic metabolic encephalopathy from CO2 narcosis. Hypercapnia improved following intubation, Currently patient is back to her baseline   # Acute on Chronic Hypoxic and Hypercapnic Respiratory Failure, uses Ox 3 L at baseline # COPD Exacerbation # Chronic Left Pleural Effusion CT chest: left sided infiltrate that appears chronic, with previous chest CT showing a left sided pleural effusion.  Normal white count, Procalcitonin of < 0.10, and stable hemodynamics argue against an infected pleural space.  Patient was extubated on 8/17, passed swallowing screen, resumed diet Use BiPAP as needed Continue DuoNeb every 6 hourly, started Prolopa inhaler Continue antibiotics ceftriaxone and azithromycin for possible CAP coverage Continue Solu-Medrol 40 mg IV daily, transition to prednisone as per improvement   # Diarrhea most likely due to antibiotics Started probiotics Monitor electrolytes  # PA-fib, HFpEF, HLD Continue Xarelto and statin Lasix was held on admission Monitor volume status. Continue telemetry   # Hyperglycemia most likely due to steroids.  No history of diabetes # Hypothyroid, continue Synthroid, follow with PCP to monitor TSH level and titrate dose accordingly # Depression, continue Lexapro # Vaginal yeast infection, started Diflucan 150 mg p.o. every 72 hours x 2 doses  # Vitamin B12 deficiency: Started vitamin B12 1000 mcg IM injection daily during hospital stay, followed by oral supplement.  Follow-up PCP to repeat vitamin B12 level after 3 to 6 months.  Body mass index is 32.83 kg/m.  Nutrition Problem: Inadequate oral intake Etiology: inability to eat (pt sedated and ventilated) Interventions:  Diet: Heart healthy diet DVT Prophylaxis: Therapeutic  Anticoagulation with Xarelto    Advance goals of care  discussion: Full code  Family Communication: family was not present at bedside, at the time of interview.  The pt provided permission to discuss medical plan with the family. Opportunity was given to ask question and all questions were answered satisfactorily.   Disposition:  Pt is from Home, admitted with Resp failure s/p Intubation, still has COPD exacerbation, which precludes a safe discharge. Discharge to home, when stable.  Subjective: No significant events overnight, patient breathing is improving, still requiring oxygen 4 L.  At home she is on 3 L.  Patient is also having diarrhea, less than 5 episodes yesterday and 2 episodes today morning. Patient was sitting comfortably in the chair, family was at bedside.  Denied any active issues.  Physical Exam: General: NAD, lying comfortably Appear in no distress, affect appropriate Eyes: PERRLA ENT: Oral Mucosa Clear, moist  Neck: no JVD,  Cardiovascular: S1 and S2 Present, no Murmur,  Respiratory: Good air entry bilaterally, mild pallor bilateral crackles and mild wheezing. Abdomen: Bowel Sound present, Soft and no tenderness,  Skin: no rashes Extremities: no Pedal edema, no calf tenderness Neurologic: without any new focal findings Gait not checked due to patient safety concerns  Vitals:   08/20/22 0743 08/20/22 0800 08/20/22 1000 08/20/22 1355  BP:  136/83 (!) 142/79   Pulse: 78 76 85 (!) 58  Resp: 20 15 (!) 23 18  Temp:  99.9 F (37.7 C)    TempSrc:      SpO2: 95% 92% (!) 88% (!) 89%  Weight:      Height:        Intake/Output Summary (Last 24 hours) at 08/20/2022 1553 Last data filed at 08/20/2022 1445 Gross per 24 hour  Intake 910.58 ml  Output 1100 ml  Net -189.42 ml   Filed Weights   08/18/22 0500 08/19/22 0500 08/20/22 0500  Weight: 93.5 kg 98.4 kg 100.9 kg    Data Reviewed: I have personally reviewed and interpreted daily labs, tele strips, imagings as discussed above. I reviewed all nursing notes, pharmacy  notes, vitals, pertinent old records I have discussed plan of care as described above with RN and patient/family.  CBC: Recent Labs  Lab 08/16/22 2017 08/17/22 0603 08/18/22 0510 08/19/22 0550 08/20/22 0501  WBC 4.8 4.4 4.4 8.7 6.8  NEUTROABS 3.6  --   --   --   --   HGB 12.8 10.7* 10.3* 10.7* 8.7*  HCT 45.9 36.5 32.6* 34.2* 26.7*  MCV 115.0* 108.6* 101.2* 101.8* 98.2  PLT 125* 122* 93* 119* 103*   Basic Metabolic Panel: Recent Labs  Lab 08/16/22 2017 08/17/22 0603 08/18/22 0510 08/19/22 0550 08/20/22 0501  NA 143 142 138 140 143  K 4.3 4.4 4.3 4.2 3.9  CL 97* 100 102 102 106  CO2 41* 33* 30 29 30   GLUCOSE 118* 110* 126* 97 93  BUN 10 12 24* 19 15  CREATININE 0.67 0.75 0.75 0.68 0.83  CALCIUM 9.2 9.0 8.8* 8.9 8.7*  MG  --  2.5* 2.3 2.1 2.2  PHOS  --  2.1* 3.8 3.4 3.7    Studies: No results found.  Scheduled Meds:  atorvastatin  20 mg Oral QHS   Chlorhexidine Gluconate Cloth  6 each Topical Daily   [START ON 08/21/2022] citalopram  20 mg Oral Daily   cyanocobalamin  1,000 mcg Intramuscular Daily   Followed by   Melene Muller ON 08/26/2022] vitamin B-12  1,000 mcg Oral Daily  feeding supplement  237 mL Oral BID BM   fluconazole  150 mg Oral Q72H   fluticasone furoate-vilanterol  1 puff Inhalation Daily   folic acid  1 mg Oral Daily   guaiFENesin  600 mg Oral BID   insulin aspart  0-9 Units Subcutaneous Q4H   ipratropium-albuterol  3 mL Nebulization Q6H   levothyroxine  200 mcg Oral Q0600   methylPREDNISolone (SOLU-MEDROL) injection  40 mg Intravenous Q24H   [START ON 08/21/2022] multivitamin with minerals  1 tablet Oral Daily   pantoprazole  40 mg Oral Daily   rivaroxaban  20 mg Oral Q supper   Continuous Infusions:  sodium chloride Stopped (08/17/22 1038)   azithromycin Stopped (08/20/22 0106)   cefTRIAXone (ROCEPHIN)  IV Stopped (08/20/22 0149)   PRN Meds: chlorpheniramine-HYDROcodone, docusate sodium, ipratropium-albuterol, polyethylene glycol  Time spent:  35 minutes  Author: Gillis Santa. MD [facility] 08/20/2022 3:53 PM  To reach On-call, see care teams to locate the attending and reach out to them via [email or web address]. If 7PM-7AM, please contact night-coverage If you still have difficulty reaching the attending provider, please page the [facility] (Director on Call) for [facility] on amion for assistance.

## 2022-08-20 NOTE — TOC Progression Note (Signed)
Transition of Care [facility]) - Progression Note    Patient Details  Name: Terri Casey MRN: [id number] Date of Birth: 1960-10-14  [facility]) CM/SW Contact  Chapman Fitch, RN Phone Number: 08/20/2022, 4:27 PM  Clinical Narrative:     Cherlyn Roberts obtained Fl2 sent for signature Bed search for LTC medicaid initiated          Expected Discharge Plan and Services                                               Social Determinants of Health (SDOH) Interventions SDOH Screenings   Tobacco Use: High Risk (08/16/2022)    Readmission Risk Interventions    09/14/2021    3:21 PM 03/21/2021   10:52 AM 03/20/2021   12:14 PM  Readmission Risk Prevention Plan  Post Dischage Appt  Complete   Medication Screening  Complete Complete  Transportation Screening  Complete Complete  PCP or Specialist Appt within 3-5 Days Complete    HRI or Home Care Consult Complete    Social Work Consult for Recovery Care Planning/Counseling Complete    Palliative Care Screening Not Applicable    Medication Review Ocean[employer]) Complete

## 2022-08-20 NOTE — Progress Notes (Addendum)
Terri Casey was waved down by family Luanna Cole & Christopher)at the [facility]. Family asked clarifying question how to go about filling the Adv Dir for the Pt. Son also confirmed having meet with Chap Jarred in the Pt room. Chap provided education on completing the forms. Fam appreciative and promised to page Terri Casey once completed their part.   08/20/22 1100  Spiritual Encounters  Type of Visit Initial  Care provided to: Family  Referral source Code page  OnCall Visit Yes

## 2022-08-20 NOTE — NC FL2 (Signed)
[place] MEDICAID FL2 LEVEL OF CARE FORM     IDENTIFICATION  Patient Name: Terri Casey Birthdate: December 29, 1960 Sex: female Admission Date (Current Location): 08/16/2022  [facility] and [place] Number:  Chiropodist and Address:         Provider Number: [id number]  Attending Physician Name and Address:  Gillis Santa, MD  Relative Name and Phone Number:       Current Level of Care: Hospital Recommended Level of Care: Skilled Nursing Facility Prior Approval Number:    Date Approved/Denied:   PASRR Number: [id number]  Discharge Plan: SNF    Current Diagnoses: Patient Active Problem List   Diagnosis Date Noted   Generalized anxiety disorder 12/17/2021   Depression 12/17/2021   Altered mental status 12/15/2021   Acute hypoxemic respiratory failure (HCC) 09/11/2021   Septic shock (HCC) 09/11/2021   Respiratory failure (HCC) 09/11/2021   Hashimoto's thyroiditis 09/05/2021   Chronic back pain greater than 3 months duration 09/05/2021   Allergic rhinitis 09/05/2021   Hypercapnia 09/05/2021   Osteopenia 09/05/2021   Periodic limb movements of sleep 09/05/2021   Suspicious nevus left buttock 03/19/2021   Acute hypercapnic respiratory failure (HCC) 03/16/2021   Hypothermia 03/16/2021   Chronic diastolic CHF (congestive heart failure) (HCC) 03/16/2021   Chronic respiratory failure with hypoxia and hypercapnia (HCC) 06/23/2020   Acute diastolic CHF (congestive heart failure) (HCC) 10/07/2019   Chronic low back pain    Encounter for intubation    Palliative care by specialist    Acute respiratory failure with hypoxia and hypercapnia (HCC) 10/05/2019   AF (paroxysmal atrial fibrillation) (HCC) 10/05/2019   Elevated brain natriuretic peptide (BNP) level 08/21/2019   Noncompliance with medication regimen 08/21/2019   Acute on chronic respiratory failure with hypoxia and hypercapnia (HCC) 06/09/2019   Obesity, Class III, BMI 40-49.9 (morbid obesity) (HCC)  06/09/2019   Thrombocytopenia (HCC) 06/09/2019   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 / still smoking 07/08/2007   Sleep apnea 07/08/2007   WEIGHT GAIN 07/08/2007    Orientation RESPIRATION BLADDER Height & Weight     Self, Time, Place  O2 (4L [place]) Continent Weight: 100.9 kg Height:  5' 9.02" (175.3 cm)  BEHAVIORAL SYMPTOMS/MOOD NEUROLOGICAL BOWEL NUTRITION STATUS      Incontinent Diet (regular)  AMBULATORY STATUS COMMUNICATION OF NEEDS Skin   Extensive Assist Verbally Bruising                       Personal Care Assistance Level of Assistance              Functional Limitations Info             SPECIAL CARE FACTORS FREQUENCY                       Contractures Contractures Info: Not present    Additional Factors Info  Code Status, Allergies Code Status Info: full  Allergies Info: Estrogens, Mustard, Strawberry Extract           Current Medications (08/20/2022):  This is the current hospital active medication list Current Facility-Administered Medications  Medication Dose Route Frequency Provider Last Rate Last Admin   0.9 %  sodium chloride infusion  250 mL Intravenous Continuous Rust-Chester, Cecelia Byars, NP   Stopped at 08/17/22 1038   atorvastatin (LIPITOR) tablet 20 mg  20 mg Oral QHS Tressie Ellis, RPH   20 mg at 08/19/22 2110   azithromycin (ZITHROMAX) 500 mg in  sodium chloride 0.9 % 250 mL IVPB  500 mg Intravenous Q24H Rust-Chester, Cecelia Byars, NP   Stopped at 08/20/22 0106   cefTRIAXone (ROCEPHIN) 2 g in sodium chloride 0.9 % 100 mL IVPB  2 g Intravenous Q24H Raechel Chute, MD   Stopped at 08/20/22 0149   Chlorhexidine Gluconate Cloth 2 % PADS 6 each  6 each Topical Daily Rust-Chester, Cecelia Byars, NP   6 each at 08/20/22 0935   chlorpheniramine-HYDROcodone (TUSSIONEX) 10-8 MG/5ML suspension 5 mL  5 mL Oral Q12H PRN Gillis Santa, MD       Melene Muller ON 08/21/2022] citalopram (CELEXA) tablet 20 mg  20 mg Oral Daily Lowella Bandy, RPH       cyanocobalamin (VITAMIN B12) injection 1,000 mcg  1,000 mcg Intramuscular Daily Gillis Santa, MD   1,000 mcg at 08/20/22 0981   Followed by   Melene Muller ON 08/26/2022] cyanocobalamin (VITAMIN B12) tablet 1,000 mcg  1,000 mcg Oral Daily Gillis Santa, MD       docusate sodium (COLACE) capsule 100 mg  100 mg Oral BID PRN Rust-Chester, Cecelia Byars, NP       feeding supplement (ENSURE ENLIVE / ENSURE PLUS) liquid 237 mL  237 mL Oral BID BM Gillis Santa, MD       fluconazole (DIFLUCAN) tablet 150 mg  150 mg Oral Q72H Gillis Santa, MD   150 mg at 08/19/22 1444   fluticasone furoate-vilanterol (BREO ELLIPTA) 200-25 MCG/ACT 1 puff  1 puff Inhalation Daily Gillis Santa, MD   1 puff at 08/20/22 0935   folic acid (FOLVITE) tablet 1 mg  1 mg Oral Daily Tressie Ellis, RPH   1 mg at 08/20/22 1914   guaiFENesin (MUCINEX) 12 hr tablet 600 mg  600 mg Oral BID Gillis Santa, MD   600 mg at 08/20/22 7829   insulin aspart (novoLOG) injection 0-9 Units  0-9 Units Subcutaneous Q4H Rust-Chester, Britton L, NP   1 Units at 08/18/22 0740   ipratropium-albuterol (DUONEB) 0.5-2.5 (3) MG/3ML nebulizer solution 3 mL  3 mL Nebulization Q6H Rust-Chester, Britton L, NP   3 mL at 08/20/22 1354   ipratropium-albuterol (DUONEB) 0.5-2.5 (3) MG/3ML nebulizer solution 3 mL  3 mL Nebulization Q4H PRN Rust-Chester, Britton L, NP   3 mL at 08/18/22 0053    levothyroxine (SYNTHROID) tablet 200 mcg  200 mcg Oral Q0600 Tressie Ellis, RPH   200 mcg at 08/20/22 0531   [START ON 08/21/2022] methylPREDNISolone sodium succinate (SOLU-MEDROL) 40 mg/mL injection 40 mg  40 mg Intravenous Q24H Gillis Santa, MD       Melene Muller ON 08/21/2022] multivitamin with minerals tablet 1 tablet  1 tablet Oral Daily Gillis Santa, MD       pantoprazole (PROTONIX) EC tablet 40 mg  40 mg Oral Daily Gillis Santa, MD   40 mg at 08/20/22 5621   polyethylene glycol (MIRALAX / GLYCOLAX) packet 17 g  17 g Oral Daily PRN Rust-Chester,  Cecelia Byars, NP       Melene Muller ON 08/22/2022] predniSONE (DELTASONE) tablet 30 mg  30 mg Oral Q breakfast Gillis Santa, MD       Followed by   Melene Muller ON 08/25/2022] predniSONE (DELTASONE) tablet 20 mg  20 mg Oral Q breakfast Gillis Santa, MD       Followed by   Melene Muller ON 08/28/2022] predniSONE (DELTASONE) tablet 10 mg  10 mg Oral Q breakfast Gillis Santa, MD       rivaroxaban Carlena Hurl) tablet 20 mg  20 mg Oral Q supper Tressie Ellis, RPH   20 mg at 08/19/22 1716   saccharomyces boulardii (FLORASTOR) capsule 250 mg  250 mg Oral BID Gillis Santa, MD         Discharge Medications: Please see discharge summary for a list of discharge medications.  Relevant Imaging Results:  Relevant Lab Results:   Additional Information ss [id number]  Chapman Fitch, RN

## 2022-08-20 NOTE — Evaluation (Signed)
Physical Therapy Evaluation Patient Details Name: Terri Casey MRN: [id number] DOB: 18-Jun-1960 Today's Date: 08/20/2022  History of Present Illness  Patient is a 62 year old female with shortness of breath. 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 Extubated 08/18/22   Clinical Impression  Patient is agreeable to PT evaluation, son at the bedside. Patient lives with her son. She is sedentary and spends most time in the bed. She can typically walk short distances of around 9ft independently but with increased difficulty with mobility at home recently. She has DME in place already.   Today, the patient is disoriented to time and situation but is cooperative and able to follow single step commands consistently. She has generalized weakness throughout and dyspnea with exertion. The patient was able to stand x 2 bouts and take several side steps with steadying assistance provided. Sp02 down to 86% briefly on 4 L02 with activity and increased to 90's with rest breaks and cues for breathing techniques. Recommend to continue PT to maximize independence and facilitate return to prior level of function.       If plan is discharge home, recommend the following: A little help with walking and/or transfers;A little help with bathing/dressing/bathroom;Help with stairs or ramp for entrance;Assist for transportation;Assistance with cooking/housework   Can travel by private vehicle        Equipment Recommendations None recommended by PT  Recommendations for Other Services       Functional Status Assessment Patient has had a recent decline in their functional status and demonstrates the ability to make significant improvements in function in a reasonable and predictable amount of time.     Precautions / Restrictions Precautions Precautions: Fall Restrictions Weight Bearing Restrictions: No       Mobility  Bed Mobility Overal bed mobility: Needs Assistance Bed Mobility: Supine to Sit, Sit to Supine     Supine to sit: HOB elevated, Used rails, Contact guard Sit to supine: Contact guard assist, HOB elevated   General bed mobility comments: increased time required to complete tasks    Transfers Overall transfer level: Needs assistance Equipment used: None Transfers: Sit to/from Stand Sit to Stand: Contact guard assist, Min assist           General transfer comment: Min A for first stand and CGA for second stand. cues for task initiation    Ambulation/Gait             Pre-gait activities: patient able to take several side steps to the left with hand held assistance. activity tolerance is limited by fatigue and dyspnea with exertion    Stairs            Wheelchair Mobility     Tilt Bed    Modified Rankin (Stroke Patients Only)       Balance Overall balance assessment: Needs assistance Sitting-balance support: Feet supported Sitting balance-Leahy Scale: Fair     Standing balance support: Single extremity supported Standing balance-Leahy Scale: Fair                               Pertinent Vitals/Pain Pain Assessment Pain Assessment: Faces Pain Location: back and L shoulder Pain Descriptors / Indicators: Discomfort Pain Intervention(s): Limited activity within patient's tolerance    Home Living Family/patient expects to be discharged to:: Private residence Living Arrangements: Children Available Help at Discharge: Family;Available PRN/intermittently Type of Home: House Home  Access: Stairs to enter   [employer] of Steps: 3-4     Home Equipment: BSC/3in1;Rolling Walker (2 wheels);Toilet riser;Cane - single point;Wheelchair - manual;Hospital bed Additional Comments: 4 L02 at baseline    Prior Function Prior Level of Function : Needs assist             Mobility Comments: usually independent with  mobility, ambulating short distance ~21ft at baseline (to the porch to smoke and back to bed). son reports increased difficulty recently with getting up and down. no recent falls ADLs Comments: presume patient requires at least set-up assistance at home     Extremity/Trunk Assessment   Upper Extremity Assessment Upper Extremity Assessment: Generalized weakness (limited active shoulder ROM bilaterally)    Lower Extremity Assessment Lower Extremity Assessment: Generalized weakness       Communication   Communication Communication: No apparent difficulties  Cognition Arousal: Alert Behavior During Therapy: WFL for tasks assessed/performed Overall Cognitive Status: Impaired/Different from baseline Area of Impairment: Memory, Following commands, Orientation                 Orientation Level: Disoriented to, Situation, Time   Memory: Decreased short-term memory Following Commands: Follows one step commands consistently                General Comments General comments (skin integrity, edema, etc.): Sp02 down to 86% with activity on 4 L and increased to 90s within one minute of rest breaks and cues for breathing techniques.    Exercises     Assessment/Plan    PT Assessment Patient needs continued PT services  PT Problem List Decreased strength;Decreased activity tolerance;Decreased balance;Decreased mobility;Decreased cognition;Decreased safety awareness;Cardiopulmonary status limiting activity       PT Treatment Interventions DME instruction;Gait training;Stair training;Functional mobility training;Therapeutic activities;Therapeutic exercise;Balance training;Neuromuscular re-education;Cognitive remediation;Patient/family education    PT Goals (Current goals can be found in the Care Plan section)  Acute Rehab PT Goals Patient Stated Goal: to get stonger PT Goal Formulation: With patient Time For Goal Achievement: 09/03/22 Potential to Achieve Goals: Good     Frequency Min 1X/week     Co-evaluation               AM-PAC PT "6 Clicks" Mobility  Outcome Measure Help needed turning from your back to your side while in a flat bed without using bedrails?: A Little Help needed moving from lying on your back to sitting on the side of a flat bed without using bedrails?: A Little Help needed moving to and from a bed to a chair (including a wheelchair)?: A Little Help needed standing up from a chair using your arms (e.g., wheelchair or bedside chair)?: A Little Help needed to walk in hospital room?: A Little Help needed climbing 3-5 steps with a railing? : A Lot 6 Click Score: 17    End of Session Equipment Utilized During Treatment: Oxygen Activity Tolerance: Patient tolerated treatment well Patient left: in bed;with call bell/phone within reach;with family/visitor present Nurse Communication: Mobility status PT Visit Diagnosis: Unsteadiness on feet (R26.81);Muscle weakness (generalized) (M62.81)    Time: 1308-6578 PT Time Calculation (min) (ACUTE ONLY): 23 min   Charges:   PT Evaluation $PT Eval Moderate Complexity: 1 Mod PT Treatments $Therapeutic Activity: 8-22 mins PT General Charges $$ ACUTE PT VISIT: 1 Visit         Donna Bernard, PT, MPT   Ina Homes 08/20/2022, 9:32 AM

## 2022-08-20 NOTE — [place] FL2 (Signed)
[place] MEDICAID FL2 LEVEL OF CARE FORM     IDENTIFICATION  Patient Name: Terri Casey Birthdate: 04-18-1960 Sex: female Admission Date (Current Location): 08/16/2022  [facility] and [place] Number:  Chiropodist and Address:         Provider Number: [id number]  Attending Physician Name and Address:  Gillis Santa, MD  Relative Name and Phone Number:       Current Level of Care: Hospital Recommended Level of Care: Skilled Nursing Facility Prior Approval Number:    Date Approved/Denied:   PASRR Number:    Discharge Plan: SNF    Current Diagnoses: Patient Active Problem List   Diagnosis Date Noted   Generalized anxiety disorder 12/17/2021   Depression 12/17/2021   Altered mental status 12/15/2021   Acute hypoxemic respiratory failure (HCC) 09/11/2021   Septic shock (HCC) 09/11/2021   Respiratory failure (HCC) 09/11/2021   Hashimoto's thyroiditis 09/05/2021   Chronic back pain greater than 3 months duration 09/05/2021   Allergic rhinitis 09/05/2021   Hypercapnia 09/05/2021   Osteopenia 09/05/2021   Periodic limb movements of sleep 09/05/2021   Suspicious nevus left buttock 03/19/2021   Acute hypercapnic respiratory failure (HCC) 03/16/2021   Hypothermia 03/16/2021   Chronic diastolic CHF (congestive heart failure) (HCC) 03/16/2021   Chronic respiratory failure with hypoxia and hypercapnia (HCC) 06/23/2020   Acute diastolic CHF (congestive heart failure) (HCC) 10/07/2019   Chronic low back pain    Encounter for intubation    Palliative care by specialist    Acute respiratory failure with hypoxia and hypercapnia (HCC) 10/05/2019   AF (paroxysmal atrial fibrillation) (HCC) 10/05/2019   Elevated brain natriuretic peptide (BNP) level 08/21/2019   Noncompliance with medication regimen 08/21/2019   Acute on chronic respiratory failure with hypoxia and hypercapnia (HCC) 06/09/2019   Obesity, Class III, BMI 40-49.9 (morbid obesity) (HCC) 06/09/2019    Thrombocytopenia (HCC) 06/09/2019   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 / still smoking 07/08/2007   Sleep apnea 07/08/2007   WEIGHT GAIN 07/08/2007    Orientation RESPIRATION BLADDER Height & Weight     Self, Time, Place  O2 (4L [place]) Continent Weight: 100.9 kg Height:  5' 9.02" (175.3 cm)  BEHAVIORAL SYMPTOMS/MOOD NEUROLOGICAL BOWEL NUTRITION STATUS      Incontinent Diet (regular)  AMBULATORY STATUS COMMUNICATION OF NEEDS Skin   Extensive Assist Verbally Bruising                       Personal Care Assistance Level of Assistance              Functional Limitations Info             SPECIAL CARE FACTORS FREQUENCY                       Contractures Contractures Info: Not present    Additional Factors Info  Code Status, Allergies Code Status Info:  full Allergies Info: Estrogens, Mustard, Strawberry Extract           Current Medications (08/20/2022):  This is the current hospital active medication list Current Facility-Administered Medications  Medication Dose Route Frequency Provider Last Rate Last Admin   0.9 %  sodium chloride infusion  250 mL Intravenous Continuous Rust-Chester, Cecelia Byars, NP   Stopped at 08/17/22 1038   atorvastatin (LIPITOR) tablet 20 mg  20 mg Oral QHS Tressie Ellis, RPH   20 mg at 08/19/22 2110   azithromycin (ZITHROMAX) 500 mg in sodium chloride  0.9 % 250 mL IVPB  500 mg Intravenous Q24H Rust-Chester, Cecelia Byars, NP   Stopped at 08/20/22 0106   cefTRIAXone (ROCEPHIN) 2 g in sodium chloride 0.9 % 100 mL IVPB  2 g Intravenous Q24H Raechel Chute, MD   Stopped at 08/20/22 0149   Chlorhexidine Gluconate Cloth 2 % PADS 6 each  6 each Topical Daily Rust-Chester, Cecelia Byars, NP   6 each at 08/20/22 0935   chlorpheniramine-HYDROcodone (TUSSIONEX) 10-8 MG/5ML suspension 5 mL  5 mL Oral Q12H PRN Gillis Santa, MD       Melene Muller ON 08/21/2022] citalopram (CELEXA) tablet 20 mg  20 mg Oral Daily Lowella Bandy, RPH       cyanocobalamin (VITAMIN B12) injection 1,000 mcg  1,000 mcg Intramuscular Daily Gillis Santa, MD   1,000 mcg at 08/20/22 1610   Followed by   Melene Muller ON 08/26/2022] cyanocobalamin (VITAMIN B12) tablet 1,000 mcg  1,000 mcg Oral Daily Gillis Santa, MD       docusate sodium (COLACE) capsule 100 mg  100 mg Oral BID PRN Rust-Chester, Cecelia Byars, NP       feeding supplement (ENSURE ENLIVE / ENSURE PLUS) liquid 237 mL  237 mL Oral BID BM Gillis Santa, MD       fluconazole (DIFLUCAN) tablet 150 mg  150 mg Oral Q72H Gillis Santa, MD   150 mg at 08/19/22 1444   fluticasone furoate-vilanterol (BREO ELLIPTA) 200-25 MCG/ACT 1 puff  1 puff Inhalation Daily Gillis Santa, MD   1 puff at 08/20/22 0935   folic acid (FOLVITE) tablet 1 mg  1 mg Oral Daily Tressie Ellis, RPH   1 mg at 08/20/22 9604   guaiFENesin (MUCINEX) 12 hr tablet 600 mg  600 mg Oral BID Gillis Santa, MD   600 mg at 08/20/22 5409   insulin aspart (novoLOG) injection 0-9 Units  0-9 Units Subcutaneous Q4H Rust-Chester, Britton L, NP   1 Units at 08/18/22 0740   ipratropium-albuterol (DUONEB) 0.5-2.5 (3) MG/3ML nebulizer solution 3 mL  3 mL Nebulization Q6H Rust-Chester, Britton L, NP   3 mL at 08/20/22 1354   ipratropium-albuterol (DUONEB) 0.5-2.5 (3) MG/3ML nebulizer solution 3 mL  3 mL Nebulization Q4H PRN Rust-Chester, Britton L, NP   3 mL at 08/18/22 0053   levothyroxine (SYNTHROID)  tablet 200 mcg  200 mcg Oral Q0600 Tressie Ellis, RPH   200 mcg at 08/20/22 0531   [START ON 08/21/2022] methylPREDNISolone sodium succinate (SOLU-MEDROL) 40 mg/mL injection 40 mg  40 mg Intravenous Q24H Gillis Santa, MD       Melene Muller ON 08/21/2022] multivitamin with minerals tablet 1 tablet  1 tablet Oral Daily Gillis Santa, MD       pantoprazole (PROTONIX) EC tablet 40 mg  40 mg Oral Daily Gillis Santa, MD   40 mg at 08/20/22 0922   polyethylene glycol (MIRALAX / GLYCOLAX) packet 17 g  17 g Oral Daily PRN  Rust-Chester, Cecelia Byars, NP       Melene Muller ON 08/22/2022] predniSONE (DELTASONE) tablet 30 mg  30 mg Oral Q breakfast Gillis Santa, MD       Followed by   Melene Muller ON 08/25/2022] predniSONE (DELTASONE) tablet 20 mg  20 mg Oral Q breakfast Gillis Santa, MD       Followed by   Melene Muller ON 08/28/2022] predniSONE (DELTASONE) tablet 10 mg  10 mg Oral Q breakfast Gillis Santa, MD       rivaroxaban Carlena Hurl) tablet 20 mg  20 mg Oral Q supper Tressie Ellis, RPH   20 mg at 08/19/22 1716   saccharomyces boulardii (FLORASTOR) capsule 250 mg  250 mg Oral BID Gillis Santa, MD         Discharge Medications: Please see discharge summary for a list of discharge medications.  Relevant Imaging Results:  Relevant Lab Results:   Additional Information ss [id number]  Chapman Fitch, RN

## 2022-08-20 NOTE — TOC Progression Note (Signed)
Transition of Care [facility]) - Progression Note    Patient Details  Name: Terri Casey MRN: [id number] Date of Birth: 05-Jun-1960  [facility] CM/SW Contact  Kreg Shropshire, RN Phone Number: 08/20/2022, 1:54 PM  Clinical Narrative:     Cm reached out to Daughter in law regarding HH PT. She stated that pt lives alone and cannot return home. Son and daughter in law can't take care of her anymore. Pt has Medicaid and cm informed pt that pt will need long term Medicaid in order to go to SNF. Pt does not have any LTC nor private funds to pay for services. Daughter in law stated that she has a CAP worker with Medicaid. Cm instructed cm to give CAP worker a call to get updates on long term Medicaid.        Expected Discharge Plan and Services                                               Social Determinants of Health (SDOH) Interventions SDOH Screenings   Tobacco Use: High Risk (08/16/2022)    Readmission Risk Interventions    09/14/2021    3:21 PM 03/21/2021   10:52 AM 03/20/2021   12:14 PM  Readmission Risk Prevention Plan  Post Dischage Appt  Complete   Medication Screening  Complete Complete  Transportation Screening  Complete Complete  PCP or Specialist Appt within 3-5 Days Complete    HRI or Home Care Consult Complete    Social Work Consult for Recovery Care Planning/Counseling Complete    Palliative Care Screening Not Applicable    Medication Review Oceanographer) Complete

## 2022-08-20 NOTE — Plan of Care (Signed)
Foley DC'd per orders

## 2022-08-20 NOTE — Progress Notes (Signed)
Nutrition Follow Up Note   DOCUMENTATION CODES:   Not applicable  INTERVENTION:   Ensure Enlive po BID, each supplement provides 350 kcal and 20 grams of protein.  MVI po daily   Liberalize diet   NUTRITION DIAGNOSIS:   Inadequate oral intake related to inability to eat (pt sedated and ventilated) as evidenced by NPO status. -resolved   GOAL:   Patient will meet greater than or equal to 90% of their needs -progressing   MONITOR:   PO intake, Supplement acceptance, Labs, Weight trends, I & O's, Skin  ASSESSMENT:   62 y/o female with h/o HLD, HTN, COPD, hypothyroidism, anxiety, depression and CHF who is admitted with COPD exacerbation.  Pt extubated 8/17. Met with pt in room today. Pt reports good appetite and oral intake at baseline. Pt reports that her oral intake is fairly good in hospital; pt is documented to be eating 15-100% of meals in hospital. Pt reports that she has been unable to order foods that she likes secondary to being on a heart healthy diet. RD discussed with pt the importance of adequate nutrition needed to preserve lean muscle. RD will add supplements and MVI to help pt meet her estimated needs (prefers chocolate). RD will also liberalize pt's diet. Per chart, pt is weight stable since admission.   Medications reviewed and include: celexa, B12, diflucan, folic acid, insulin, synthroid, solu-medrol, protonix, azithromycin, ceftriaxone  Labs reviewed: K 3.9 wnl, P 3.7 wnl, Mg 2.2 wnl B12 150(L)- 8/18 Hgb 8.7(L), Hct 26.7(L) Cbgs- 116, 102, 82, 91 x 24 hrs   Diet Order:   Diet Order             Diet regular Room service appropriate? Yes; Fluid consistency: Thin  Diet effective now                  EDUCATION NEEDS:   No education needs have been identified at this time  Skin:  Skin Assessment: Reviewed RN Assessment (ecchymosis)  Last BM:  pta  Height:   Ht Readings from Last 1 Encounters:  08/16/22 5' 9.02" (1.753 m)    Weight:   Wt  Readings from Last 1 Encounters:  08/20/22 100.9 kg    Ideal Body Weight:  65.9 kg  BMI:  Body mass index is 32.83 kg/m.  Estimated Nutritional Needs:   Kcal:  1900-2200kcal/day  Protein:  95-110g/day  Fluid:  1.7-2.0L/day  Betsey Holiday MS, RD, LDN Please refer to [facility] for RD and/or RD on-call/weekend/after hours pager

## 2022-08-20 NOTE — Progress Notes (Signed)
   08/20/22 1000  Spiritual Encounters  Type of Visit Initial  Care provided to: Pt and family  Referral source Nurse (RN/NT/LPN);Family  Reason for visit Advance directives  OnCall Visit No  Spiritual Framework  Presenting Themes Meaning/purpose/sources of inspiration;Community and relationships  Patient Stress Factors None identified  Family Stress Factors None identified  Interventions  Spiritual Care Interventions Made Established relationship of care and support;Compassionate presence;Reflective listening  Intervention Outcomes  Outcomes Connection to spiritual care  Spiritual Care Plan  Spiritual Care Issues Still Outstanding Chaplain will continue to follow   Provided paperwork for Advance Directive to PT. Educated patient and family on a AD. They are overlooking paperwork and reach out to Chaplain once they are ready to sign AD.

## 2022-08-21 DIAGNOSIS — J9621 Acute and chronic respiratory failure with hypoxia: Secondary | ICD-10-CM | POA: Diagnosis not present

## 2022-08-21 DIAGNOSIS — J9622 Acute and chronic respiratory failure with hypercapnia: Secondary | ICD-10-CM | POA: Diagnosis not present

## 2022-08-21 LAB — GLUCOSE, CAPILLARY
Glucose-Capillary: 134 mg/dL — ABNORMAL HIGH (ref 70–99)
Glucose-Capillary: 84 mg/dL (ref 70–99)
Glucose-Capillary: 84 mg/dL (ref 70–99)
Glucose-Capillary: 107 mg/dL — ABNORMAL HIGH (ref 70–99)
Glucose-Capillary: 134 mg/dL — ABNORMAL HIGH (ref 70–99)
Glucose-Capillary: 149 mg/dL — ABNORMAL HIGH (ref 70–99)
Glucose-Capillary: 103 mg/dL — ABNORMAL HIGH (ref 70–99)
Glucose-Capillary: 99 mg/dL (ref 70–99)

## 2022-08-21 LAB — BASIC METABOLIC PANEL
Anion gap: 7 (ref 5–15)
BUN: 10 mg/dL (ref 8–23)
CO2: 29 mmol/L (ref 22–32)
Calcium: 8.9 mg/dL (ref 8.9–10.3)
Chloride: 103 mmol/L (ref 98–111)
Creatinine, Ser: 0.62 mg/dL (ref 0.44–1.00)
GFR, Estimated: >60 mL/min (ref >60)
Glucose, Bld: 95 mg/dL (ref 70–99)
Potassium: 3.7 mmol/L (ref 3.5–5.1)
Sodium: 139 mmol/L (ref 135–145)

## 2022-08-21 LAB — CBC
HCT: 32.1 % — ABNORMAL LOW (ref 36.0–46.0)
Hemoglobin: 10.3 g/dL — ABNORMAL LOW (ref 12.0–15.0)
MCH: 32.1 pg (ref 26.0–34.0)
MCHC: 32.1 g/dL (ref 30.0–36.0)
MCV: 100 fL (ref 80.0–100.0)
Platelets: 95 10*3/uL — ABNORMAL LOW (ref 150–400)
RBC: 3.21 MIL/uL — ABNORMAL LOW (ref 3.87–5.11)
RDW: 14.1 % (ref 11.5–15.5)
WBC: 5.6 10*3/uL (ref 4.0–10.5)
nRBC: 0 % (ref 0.0–0.2)

## 2022-08-21 LAB — CULTURE, BLOOD (SINGLE): Culture: NO GROWTH

## 2022-08-21 LAB — PHOSPHORUS: Phosphorus: 3.7 mg/dL (ref 2.5–4.6)

## 2022-08-21 LAB — MAGNESIUM: Magnesium: 2.4 mg/dL (ref 1.7–2.4)

## 2022-08-21 MED ORDER — OXYCODONE-ACETAMINOPHEN 5-325 MG PO TABS: 1.0000 | ORAL_TABLET | Freq: Four times a day (QID) | ORAL | Status: DC

## 2022-08-21 MED ORDER — OXYCODONE-ACETAMINOPHEN 10-325 MG PO TABS: 1.0000 | ORAL_TABLET | Freq: Four times a day (QID) | ORAL | Status: DC

## 2022-08-21 MED ORDER — OXYCODONE-ACETAMINOPHEN 5-325 MG PO TABS
1.0000 | ORAL_TABLET | Freq: Four times a day (QID) | ORAL | Status: DC | PRN
  Administered 2022-08-22: 1 via ORAL
  Filled 2022-08-21: qty 1

## 2022-08-21 MED ORDER — IPRATROPIUM-ALBUTEROL 0.5-2.5 (3) MG/3ML IN SOLN
3.0000 mL | Freq: Two times a day (BID) | RESPIRATORY_TRACT | Status: DC
  Administered 2022-08-21 – 2022-08-24 (×6): 3 mL via RESPIRATORY_TRACT
  Filled 2022-08-21 (×6): qty 3

## 2022-08-21 MED ORDER — OXYCODONE HCL 5 MG PO TABS
5.0000 mg | ORAL_TABLET | Freq: Four times a day (QID) | ORAL | Status: DC
  Administered 2022-08-21: 5 mg via ORAL
  Filled 2022-08-21: qty 1

## 2022-08-21 MED ORDER — NALOXONE HCL 0.4 MG/ML IJ SOLN: 0.4000 mg | INTRAMUSCULAR | Status: DC | PRN

## 2022-08-21 NOTE — Progress Notes (Signed)
[facility] Progress Note  Patient: Terri Casey    GNF:[id number]  DOA: 08/16/2022     Date of Service: the patient was seen and examined on 08/21/2022  Chief Complaint  Patient presents with   Shortness of Breath   Brief hospital course: 62 yo F presenting to [facility] ED on 08/16/22 for evaluation of worsening dyspnea.   History obtained via chart review and telephone report from daughter-in-law, [name]. Daughter-in-law describes the patient as being in her baseline state of health until 08/13/2022.  The patient has had progressive shortness of breath and poor p.o. intake.  On the morning of 08/16/2022 she seemed more dyspneic and lethargic, she described her lips as "changing color".  They increased her nasal cannula oxygen to 5 L from 3-4, and the patient appeared to recover.  Later in the evening she had a similar episode becoming increasingly dyspneic, lethargic with changes to her coloration.  Daughter-in-law called EMS when she noted her SpO2 to be in the 70's. ED course: Upon arrival patient was lethargic placed on BIPAP support, patient had mild tachypnea but otherwise vital signs stable on 40% FiO2.  At first the patient appeared to improve on BiPAP support but then became increasingly agitated and confused prompting urgent intubation and mechanical ventilatory support.  Labs significant for respiratory acidosis and elevated MCV with mild thrombocytopenia.  (At baseline the patient has macrocytic anemia) Medications given: Duo neb x 3, solu medrol 125 mg, 2 g Mg, fentanyl/ succinylcholine/ etomidate, 1 L NS bolus, propofol drip   CXR 08/16/22: Mild Lingular scarring/atelectasis, no other cardiopulmonary abnormalities. CT head wo contrast 08/16/22: No acute intracranial abnormality   PCCM consulted for admission due to acute on chronic hypoxic/hypercapnic respiratory failure secondary to AECOPD requiring urgent intubation and mechanical ventilatory support.  Patient was extubated  on 08/18/2022 and downgraded under TRH on 08/19/2022  Assessment and Plan:  # Toxic Metabolic Encephalopathy # CO2 Narcosis Altered mental status secondary to toxic metabolic encephalopathy from CO2 narcosis. Hypercapnia improved following intubation, Currently patient is back to her baseline   # Acute on Chronic Hypoxic and Hypercapnic Respiratory Failure, uses Ox 3 L at baseline # COPD Exacerbation # Chronic Left Pleural Effusion CT chest: left sided infiltrate that appears chronic, with previous chest CT showing a left sided pleural effusion.  Normal white count, Procalcitonin of < 0.10, and stable hemodynamics argue against an infected pleural space.  Patient was extubated on 8/17, passed swallowing screen, resumed diet Use BiPAP as needed Continue DuoNeb every 6 hourly, started Prolopa inhaler Continue antibiotics ceftriaxone and azithromycin for possible CAP coverage S/p  Solu-Medrol 40 mg IV daily, transitioned to prednisone po from 8/21   # Diarrhea most likely due to antibiotics Started probiotics Monitor electrolytes  # PA-fib, HFpEF, HLD Continue Xarelto and statin Lasix was held on admission Monitor volume status. Continue telemetry   # Hyperglycemia most likely due to steroids.  No history of diabetes # Hypothyroid, continue Synthroid, follow with PCP to monitor TSH level and titrate dose accordingly # Depression, continue Lexapro # Vaginal yeast infection, started Diflucan 150 mg p.o. every 72 hours x 2 doses  # Vitamin B12 deficiency: Started vitamin B12 1000 mcg IM injection daily during hospital stay, followed by oral supplement.  Follow-up PCP to repeat vitamin B12 level after 3 to 6 months.  Body mass index is 32.83 kg/m.  Nutrition Problem: Inadequate oral intake Etiology: inability to eat (pt sedated and ventilated) Interventions:  Diet: Heart healthy diet DVT Prophylaxis:  Therapeutic Anticoagulation with Xarelto    Advance goals of care discussion:  Full code  Family Communication: family was not present at bedside, at the time of interview.  The pt provided permission to discuss medical plan with the family. Opportunity was given to ask question and all questions were answered satisfactorily.   Disposition:  Pt is from Home, admitted with Resp failure s/p Intubation, COPD is improving, s/p IV Solu-Medrol, transition to oral prednisone.  Stable for discharge when bed will be available. Discharge to SNF will follow TOC.   Subjective: No significant events overnight, patient breathing is improving, still using forted oxygen by nasal cannula, denies any specific complaints, no chest pain or palpitations.   Physical Exam: General: NAD, lying comfortably Appear in no distress, affect appropriate Eyes: PERRLA ENT: Oral Mucosa Clear, moist  Neck: no JVD,  Cardiovascular: S1 and S2 Present, no Murmur,  Respiratory: Good air entry bilaterally, no crackles and minimal wheezing. Abdomen: Bowel Sound present, Soft and no tenderness,  Skin: no rashes Extremities: no Pedal edema, no calf tenderness Neurologic: without any new focal findings Gait not checked due to patient safety concerns  Vitals:   08/21/22 1200 08/21/22 1300 08/21/22 1400 08/21/22 1500  BP: 122/66  (!) 194/155   Pulse: 79 80 84 76  Resp: 19  (!) 21 20  Temp:      TempSrc:      SpO2: 97% 94% 96% 98%  Weight:      Height:        Intake/Output Summary (Last 24 hours) at 08/21/2022 1740 Last data filed at 08/21/2022 1500 Gross per 24 hour  Intake 810.68 ml  Output 300 ml  Net 510.68 ml   Filed Weights   08/18/22 0500 08/19/22 0500 08/20/22 0500  Weight: 93.5 kg 98.4 kg 100.9 kg    Data Reviewed: I have personally reviewed and interpreted daily labs, tele strips, imagings as discussed above. I reviewed all nursing notes, pharmacy notes, vitals, pertinent old records I have discussed plan of care as described above with RN and patient/family.  CBC: Recent  Labs  Lab 08/16/22 2017 08/17/22 0603 08/18/22 0510 08/19/22 0550 08/20/22 0501 08/21/22 0542  WBC 4.8 4.4 4.4 8.7 6.8 5.6  NEUTROABS 3.6  --   --   --   --   --   HGB 12.8 10.7* 10.3* 10.7* 8.7* 10.3*  HCT 45.9 36.5 32.6* 34.2* 26.7* 32.1*  MCV 115.0* 108.6* 101.2* 101.8* 98.2 100.0  PLT 125* 122* 93* 119* 103* 95*   Basic Metabolic Panel: Recent Labs  Lab 08/17/22 0603 08/18/22 0510 08/19/22 0550 08/20/22 0501 08/21/22 0542  NA 142 138 140 143 139  K 4.4 4.3 4.2 3.9 3.7  CL 100 102 102 106 103  CO2 33* 30 29 30 29   GLUCOSE 110* 126* 97 93 95  BUN 12 24* 19 15 10   CREATININE 0.75 0.75 0.68 0.83 0.62  CALCIUM 9.0 8.8* 8.9 8.7* 8.9  MG 2.5* 2.3 2.1 2.2 2.4  PHOS 2.1* 3.8 3.4 3.7 3.7    Studies: No results found.  Scheduled Meds:  atorvastatin  20 mg Oral QHS   Chlorhexidine Gluconate Cloth  6 each Topical Daily   citalopram  20 mg Oral Daily   cyanocobalamin  1,000 mcg Intramuscular Daily   Followed by   Melene Muller ON 08/26/2022] vitamin B-12  1,000 mcg Oral Daily   feeding supplement  237 mL Oral BID BM   fluconazole  150 mg Oral Q72H   fluticasone  furoate-vilanterol  1 puff Inhalation Daily   folic acid  1 mg Oral Daily   guaiFENesin  600 mg Oral BID   insulin aspart  0-9 Units Subcutaneous Q4H   ipratropium-albuterol  3 mL Nebulization BID   levothyroxine  200 mcg Oral Q0600   multivitamin with minerals  1 tablet Oral Daily   pantoprazole  40 mg Oral Daily   [START ON 08/22/2022] predniSONE  30 mg Oral Q breakfast   Followed by   Melene Muller ON 08/25/2022] predniSONE  20 mg Oral Q breakfast   Followed by   Melene Muller ON 08/28/2022] predniSONE  10 mg Oral Q breakfast   rivaroxaban  20 mg Oral Q supper   saccharomyces boulardii  250 mg Oral BID   Continuous Infusions:  sodium chloride Stopped (08/17/22 1038)   PRN Meds: chlorpheniramine-HYDROcodone, docusate sodium, ipratropium-albuterol, naLOXone (NARCAN)  injection, oxyCODONE-acetaminophen, polyethylene  glycol  Time spent: 35 minutes  Author: Gillis Santa. MD [facility] 08/21/2022 5:40 PM  To reach On-call, see care teams to locate the attending and reach out to them via [email or web address]. If 7PM-7AM, please contact night-coverage If you still have difficulty reaching the attending provider, please page the [facility] (Director on Call) for [facility] on amion for assistance.

## 2022-08-21 NOTE — Progress Notes (Signed)
Occupational Therapy Treatment Patient Details Name: Terri Casey MRN: [id number] DOB: [birth year] Today's Date: 08/21/2022   History of present illness Patient is a 62 year old female with shortness of breath. 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 Extubated 08/18/22   OT comments  Terri Casey was seen for OT/PT co-treatment on this date. Upon arrival to room pt reclined in bed, family at bed side, agreeable to tx. Pt requires no assist to exit bed, good sitting balance. CGA + RW for ~20 ft functional mobility, +2 for lines mgmt. SpO2 desat 87% on 4L [place], resolved to 90s in sitting. Per family at bedside pt refuses to utilize RW at home, prefers furniture walking of HHA, attempted to simulate home transfer however pt requesting to use walker. Educated on falls prevention. Per family at bedside, pt appears near recent functional baseline. Pt making good progress toward goals, will continue to follow POC. Discharge recommendation remains appropriate.       If plan is discharge home, recommend the following:  A little help with walking and/or transfers;A little help with bathing/dressing/bathroom;Help with stairs or ramp for entrance   Equipment Recommendations  None recommended by OT (per family have all equipment, report needing new hospital bed mattress)    Recommendations for Other Services      Precautions / Restrictions Precautions Precautions: Fall Restrictions Weight Bearing Restrictions: No       Mobility Bed Mobility Overal bed mobility: Modified Independent                  Transfers Overall transfer level: Needs assistance Equipment used: Rolling walker (2 wheels) Transfers: Sit to/from Stand, Bed to chair/wheelchair/BSC Sit to Stand: Contact guard assist                 Balance Overall balance assessment: Needs assistance Sitting-balance support: Feet  supported Sitting balance-Leahy Scale: Fair     Standing balance support: Bilateral upper extremity supported, Reliant on assistive device for balance Standing balance-Leahy Scale: Fair                             ADL either performed or assessed with clinical judgement   ADL Overall ADL's : Needs assistance/impaired                                       General ADL Comments: CGA + RW for toilet t/f      Cognition Arousal: Alert Behavior During Therapy: WFL for tasks assessed/performed Overall Cognitive Status: Within Functional Limits for tasks assessed                                                General Comments desat 87% on 4L [place], resolved to low 90s in sitting    Pertinent Vitals/ Pain       Pain Assessment Pain Assessment: 0-10 Pain Score: 7  Pain Location: migraine Pain Descriptors / Indicators: Headache Pain Intervention(s): Limited activity within patient's tolerance, Repositioned   Frequency  Min 1X/week        Progress Toward Goals  OT Goals(current goals can now be found in the care plan section)  Progress towards OT goals: Progressing  toward goals  Acute Rehab OT Goals Patient Stated Goal: to return to PLOF OT Goal Formulation: With patient/family Time For Goal Achievement: 09/03/22 Potential to Achieve Goals: Good ADL Goals Pt Will Perform Grooming: with modified independence;standing Pt Will Perform Lower Body Dressing: with modified independence;sit to/from stand Pt Will Transfer to Toilet: with modified independence;ambulating;regular height toilet  Plan      Co-evaluation    PT/OT/SLP Co-Evaluation/Treatment: Yes Reason for Co-Treatment: For patient/therapist safety;To address functional/ADL transfers PT goals addressed during session: Mobility/safety with mobility OT goals addressed during session: ADL's and self-care      AM-PAC OT "6 Clicks" Daily Activity     Outcome  Measure   Help from another person eating meals?: None Help from another person taking care of personal grooming?: A Little Help from another person toileting, which includes using toliet, bedpan, or urinal?: A Lot Help from another person bathing (including washing, rinsing, drying)?: A Little Help from another person to put on and taking off regular upper body clothing?: A Little Help from another person to put on and taking off regular lower body clothing?: A Lot 6 Click Score: 17    End of Session Equipment Utilized During Treatment: Oxygen  OT Visit Diagnosis: Other abnormalities of gait and mobility (R26.89);Muscle weakness (generalized) (M62.81)   Activity Tolerance Patient tolerated treatment well   Patient Left in chair;with call bell/phone within reach;with family/visitor present   Nurse Communication          Time: 1610-9604 OT Time Calculation (min): 24 min  Charges: OT General Charges $OT Visit: 1 Visit OT Treatments $Self Care/Home Management : 8-22 mins  Kathie Dike, M.S. OTR/L  08/21/22, 2:35 PM  ascom [phone]

## 2022-08-21 NOTE — Progress Notes (Addendum)
Physical Therapy Treatment Patient Details Name: Terri Casey MRN: [id number] DOB: [birth year] Today's Date: 08/21/2022   History of Present Illness Patient is a 62 year old female with shortness of breath. 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 Extubated 08/18/22.     PT Comments  Patient in bed upon arrival, with family at bedside. Patient reluctantly agreeable to session this date. Patient able to complete bed mobility with Mod I this date, but require CGA to stand from EOB and ambulation with RW. Patient reluctant to trial ambulation without AD (per family reports was ambulating without AD, but did furniture walk). Patient was able to ambulate 20 ft with CGA, 2nd person present to help with line/lead management. Patient desat to 87% with ambulation but improvements noted to low to mid 90's with breathing and rest break. Patient left in recliner with daughter present, and all needs in reach. Patient progressing with acute PT services and will continue to benefit. Will continue to follow acutely.    If plan is discharge home, recommend the following: A little help with walking and/or transfers;A little help with bathing/dressing/bathroom;Help with stairs or ramp for entrance;Assist for transportation;Assistance with cooking/housework   Can travel by private vehicle        Equipment Recommendations  None recommended by PT    Recommendations for Other Services       Precautions / Restrictions Precautions Precautions: Fall Restrictions Weight Bearing Restrictions: No     Mobility  Bed Mobility Overal bed mobility: Modified Independent                  Transfers Overall transfer level: Needs assistance Equipment used: Rolling walker (2 wheels) Transfers: Sit to/from Stand, Bed to chair/wheelchair/BSC Sit to Stand: Contact guard assist   Step pivot transfers: Contact  guard assist       General transfer comment: CGA assist to stand from bed and transfer to recliner with use of RW    Ambulation/Gait Ambulation/Gait assistance: Contact guard assist (+2 assist for lines/lead management) Gait Distance (Feet): 20 Feet Assistive device: Rolling walker (2 wheels)   Gait velocity: Decreased     General Gait Details: Patient able to ambulate with CGA, 2nd person assist required due to lines/leads. Patient able to tolerate approx 20 ft of ambulation before returning to recliner. Sp02: 87% on 4L with ambulation, but improvements noted with cues for breathing. All other vitals stable. PT attempted to ambualte without AD, but patient refuse to trial this date.   Stairs             Wheelchair Mobility     Tilt Bed    Modified Rankin (Stroke Patients Only)       Balance Overall balance assessment: Needs assistance Sitting-balance support: Feet supported Sitting balance-Leahy Scale: Good     Standing balance support: Bilateral upper extremity supported, Reliant on assistive device for balance, During functional activity Standing balance-Leahy Scale: Fair Standing balance comment: increased reliance through UE                            Cognition Arousal: Alert Behavior During Therapy: WFL for tasks assessed/performed Overall Cognitive Status: Within Functional Limits for tasks assessed  Exercises      General Comments General comments (skin integrity, edema, etc.): Patient desat to 87% on 4L with ambulation, returned to slow to mid 90s with breathing and seated rest break      Pertinent Vitals/Pain Pain Assessment Pain Assessment: 0-10 Pain Score: 7  Pain Location: Migraine Pain Descriptors / Indicators: Headache Pain Intervention(s): Limited activity within patient's tolerance    Home Living                          Prior Function             PT Goals (current goals can now be found in the care plan section) Acute Rehab PT Goals PT Goal Formulation: With patient Time For Goal Achievement: 09/03/22 Potential to Achieve Goals: Good Progress towards PT goals: Progressing toward goals    Frequency    Min 1X/week      PT Plan      Co-evaluation PT/OT/SLP Co-Evaluation/Treatment: Yes Reason for Co-Treatment: For patient/therapist safety;To address functional/ADL transfers PT goals addressed during session: Mobility/safety with mobility OT goals addressed during session: ADL's and self-care      AM-PAC PT "6 Clicks" Mobility   Outcome Measure  Help needed turning from your back to your side while in a flat bed without using bedrails?: None Help needed moving from lying on your back to sitting on the side of a flat bed without using bedrails?: A Little Help needed moving to and from a bed to a chair (including a wheelchair)?: A Little Help needed standing up from a chair using your arms (e.g., wheelchair or bedside chair)?: A Little Help needed to walk in hospital room?: A Little Help needed climbing 3-5 steps with a railing? : A Lot 6 Click Score: 18    End of Session Equipment Utilized During Treatment: Gait belt;Oxygen Activity Tolerance: Patient tolerated treatment well Patient left: in chair;with call bell/phone within reach;with family/visitor present Nurse Communication: Mobility status PT Visit Diagnosis: Unsteadiness on feet (R26.81);Muscle weakness (generalized) (M62.81)     Time: 4098-1191 PT Time Calculation (min) (ACUTE ONLY): 24 min  Charges:    $Gait Training: 8-22 mins PT General Charges $$ ACUTE PT VISIT: 1 Visit                     Creed Copper Fairly, PT, DPT 08/21/22 2:57 PM

## 2022-08-21 NOTE — Plan of Care (Signed)
  Problem: Clinical Measurements: Goal: Ability to maintain clinical measurements within normal limits will improve Outcome: Progressing   

## 2022-08-22 DIAGNOSIS — J9621 Acute and chronic respiratory failure with hypoxia: Secondary | ICD-10-CM | POA: Diagnosis not present

## 2022-08-22 DIAGNOSIS — J9622 Acute and chronic respiratory failure with hypercapnia: Secondary | ICD-10-CM | POA: Diagnosis not present

## 2022-08-22 LAB — BLOOD GAS, VENOUS
Acid-Base Excess: 12.3 mmol/L — ABNORMAL HIGH (ref 0.0–2.0)
Bicarbonate: 47 mmol/L — ABNORMAL HIGH (ref 20.0–28.0)
Delivery systems: POSITIVE
FIO2: 35 %
Mechanical Rate: 12
O2 Saturation: 38.6 %
Patient temperature: 37
pCO2, Ven: >123 mmHg (ref 44–60)
pH, Ven: 7.14 — CL (ref 7.25–7.43)

## 2022-08-22 LAB — BASIC METABOLIC PANEL
Anion gap: 7 (ref 5–15)
BUN: 16 mg/dL (ref 8–23)
CO2: 29 mmol/L (ref 22–32)
Calcium: 8.8 mg/dL — ABNORMAL LOW (ref 8.9–10.3)
Chloride: 101 mmol/L (ref 98–111)
Creatinine, Ser: 0.7 mg/dL (ref 0.44–1.00)
GFR, Estimated: >60 mL/min (ref >60)
Glucose, Bld: 90 mg/dL (ref 70–99)
Potassium: 3.9 mmol/L (ref 3.5–5.1)
Sodium: 137 mmol/L (ref 135–145)

## 2022-08-22 LAB — GLUCOSE, CAPILLARY
Glucose-Capillary: 83 mg/dL (ref 70–99)
Glucose-Capillary: 79 mg/dL (ref 70–99)
Glucose-Capillary: 100 mg/dL — ABNORMAL HIGH (ref 70–99)
Glucose-Capillary: 154 mg/dL — ABNORMAL HIGH (ref 70–99)
Glucose-Capillary: 168 mg/dL — ABNORMAL HIGH (ref 70–99)

## 2022-08-22 LAB — PHOSPHORUS: Phosphorus: 4.1 mg/dL (ref 2.5–4.6)

## 2022-08-22 LAB — CBC
HCT: 34.3 % — ABNORMAL LOW (ref 36.0–46.0)
Hemoglobin: 10.6 g/dL — ABNORMAL LOW (ref 12.0–15.0)
MCH: 31.6 pg (ref 26.0–34.0)
MCHC: 30.9 g/dL (ref 30.0–36.0)
MCV: 102.4 fL — ABNORMAL HIGH (ref 80.0–100.0)
Platelets: 120 10*3/uL — ABNORMAL LOW (ref 150–400)
RBC: 3.35 MIL/uL — ABNORMAL LOW (ref 3.87–5.11)
RDW: 14.2 % (ref 11.5–15.5)
WBC: 5.3 10*3/uL (ref 4.0–10.5)
nRBC: 0 % (ref 0.0–0.2)

## 2022-08-22 LAB — MAGNESIUM: Magnesium: 2.2 mg/dL (ref 1.7–2.4)

## 2022-08-22 MED ORDER — INSULIN ASPART 100 UNIT/ML IJ SOLN
0.0000 [IU] | Freq: Three times a day (TID) | INTRAMUSCULAR | Status: DC
  Administered 2022-08-22: 2 [IU] via SUBCUTANEOUS
  Filled 2022-08-22: qty 1

## 2022-08-22 MED ORDER — ESCITALOPRAM OXALATE 10 MG PO TABS
10.0000 mg | ORAL_TABLET | Freq: Every day | ORAL | Status: DC
  Administered 2022-08-23 – 2022-08-24 (×2): 10 mg via ORAL
  Filled 2022-08-22 (×2): qty 1

## 2022-08-22 MED ORDER — ONDANSETRON HCL 4 MG/2ML IJ SOLN
4.0000 mg | Freq: Four times a day (QID) | INTRAMUSCULAR | Status: DC | PRN
  Administered 2022-08-22: 4 mg via INTRAVENOUS
  Filled 2022-08-22: qty 2

## 2022-08-22 NOTE — Plan of Care (Signed)

## 2022-08-22 NOTE — Progress Notes (Signed)
PROGRESS NOTE    Terri Casey  ZOX:[id number] DOB: June 12, 1960 DOA: 08/16/2022 PCP: Karl Bales, NP    Assessment & Plan:   Principal Problem:   Acute on chronic respiratory failure (HCC)  Assessment and Plan: Toxic Metabolic Encephalopathy: likely secondary to CO2 narcosis. Mental status has improved.   CO2 Narcosis: Bipap prn. S/p intubation & extubation    Acute on Chronic hypoxic & hypercapnic Respiratory Failure: continue on supplemental oxygen, uses 3L [place] at baseline. S/p intubation & extubation.   COPD exacerbation: complete abx course. Bronchodilators prn. Encourage incentive spirometry   Chronic Left Pleural Effusion: etiology unclear. CT chest: left sided infiltrate that appears chronic, with previous chest CT showing a left sided pleural effusion.   Diarrhea: likely secondary to abx use. Not currently receiving abxs    PAF: continue on xarelto. Not on any rate controlling meds   Chronic diastolic CHF: appears compensated. Monitor I/Os    Hyperglycemia: likely secondarf to steroid use. No hx of DM  Hypothyroidism: continue on levothyroxine   Depression: severity unknown. Continue on lexapro   Vaginal yeast infection: completed fluconazole course     Vitamin B12 deficiency: continue on B12 supplement        DVT prophylaxis: xarelto  Code Status: full  Family Communication:  Disposition Plan: PT/OT recs HH    Level of care: Progressive Consultants:  ICU  Procedures:  Antimicrobials:  Subjective: Pt c/o shortness of breath   Objective: Vitals:   08/21/22 2249 08/22/22 0329 08/22/22 0738 08/22/22 0755  BP: 135/81 (!) 143/88  137/78  Pulse: 68 73  72  Resp: 19 18  20   Temp: 97.9 F (36.6 C) 98 F (36.7 C)  98.2 F (36.8 C)  TempSrc: Oral Oral    SpO2: 100% 99% 98% 99%  Weight:      Height:        Intake/Output Summary (Last 24 hours) at 08/22/2022 0819 Last data filed at 08/22/2022 0600 Gross per 24 hour  Intake 480 ml   Output 250 ml  Net 230 ml   Filed Weights   08/19/22 0500 08/20/22 0500 08/21/22 2129  Weight: 98.4 kg 100.9 kg 89 kg    Examination:  General exam: Appears calm and comfortable  Respiratory system: Clear to auscultation. Respiratory effort normal. Cardiovascular system: S1 & S2+. No rubs, gallops or clicks.  Gastrointestinal system: Abdomen is nondistended, soft and nontender. Normal bowel sounds heard. Central nervous system: Alert and awake. Moves all extremities Psychiatry: Judgement and insight appear improved. Flat mood and affect    Data Reviewed: I have personally reviewed following labs and imaging studies  CBC: Recent Labs  Lab 08/16/22 2017 08/17/22 0603 08/18/22 0510 08/19/22 0550 08/20/22 0501 08/21/22 0542 08/22/22 0705  WBC 4.8   < > 4.4 8.7 6.8 5.6 5.3  NEUTROABS 3.6  --   --   --   --   --   --   HGB 12.8   < > 10.3* 10.7* 8.7* 10.3* 10.6*  HCT 45.9   < > 32.6* 34.2* 26.7* 32.1* 34.3*  MCV 115.0*   < > 101.2* 101.8* 98.2 100.0 102.4*  PLT 125*   < > 93* 119* 103* 95* 120*   < > = values in this interval not displayed.   Basic Metabolic Panel: Recent Labs  Lab 08/18/22 0510 08/19/22 0550 08/20/22 0501 08/21/22 0542 08/22/22 0705  NA 138 140 143 139 137  K 4.3 4.2 3.9 3.7 3.9  CL 102 102 106  103 101  CO2 30 29 30 29 29   GLUCOSE 126* 97 93 95 90  BUN 24* 19 15 10 16   CREATININE 0.75 0.68 0.83 0.62 0.70  CALCIUM 8.8* 8.9 8.7* 8.9 8.8*  MG 2.3 2.1 2.2 2.4 2.2  PHOS 3.8 3.4 3.7 3.7 4.1   GFR: Estimated Creatinine Clearance: 85.6 mL/min (by C-G formula based on SCr of 0.7 mg/dL). Liver Function Tests: Recent Labs  Lab 08/16/22 2017 08/18/22 0510 08/19/22 0550  AST 15  --   --   ALT 11  --   --   ALKPHOS 95  --   --   BILITOT 0.5  --   --   PROT 7.2  --   --   ALBUMIN 4.0 3.3* 3.5   No results for input(s): "LIPASE", "AMYLASE" in the last 168 hours. No results for input(s): "AMMONIA" in the last 168 hours. Coagulation Profile: No  results for input(s): "INR", "PROTIME" in the last 168 hours. Cardiac Enzymes: No results for input(s): "CKTOTAL", "CKMB", "CKMBINDEX", "TROPONINI" in the last 168 hours. BNP (last 3 results) No results for input(s): "PROBNP" in the last 8760 hours. HbA1C: No results for input(s): "HGBA1C" in the last 72 hours. CBG: Recent Labs  Lab 08/21/22 1935 08/21/22 2145 08/21/22 2316 08/22/22 0328 08/22/22 0752  GLUCAP 149* 103* 99 83 79   Lipid Profile: Recent Labs    08/20/22 0501  TRIG 88   Thyroid Function Tests: No results for input(s): "TSH", "T4TOTAL", "FREET4", "T3FREE", "THYROIDAB" in the last 72 hours. Anemia Panel: Recent Labs    08/19/22 1001  VITAMINB12 150*  FOLATE 25.0  TIBC 342  IRON 38   Sepsis Labs: Recent Labs  Lab 08/16/22 2017 08/17/22 0603 08/18/22 0510  PROCALCITON <0.10 <0.10 <0.10    Recent Results (from the past 240 hour(s))  Blood culture (single)     Status: None   Collection Time: 08/16/22  8:17 PM   Specimen: BLOOD  Result Value Ref Range Status   Specimen Description BLOOD BLOOD LEFT ARM  Final   Special Requests   Final    BOTTLES DRAWN AEROBIC AND ANAEROBIC Blood Culture results may not be optimal due to an excessive volume of blood received in culture bottles   Culture   Final    NO GROWTH 5 DAYS Performed at [facility], [address], [place], [place] [address]    Report Status 08/21/2022 FINAL  Final  SARS Coronavirus 2 by RT PCR (hospital order, performed in [facility] hospital lab) *cepheid single result test* Anterior Nasal Swab     Status: None   Collection Time: 08/16/22  8:17 PM   Specimen: Anterior Nasal Swab  Result Value Ref Range Status   SARS Coronavirus 2 by RT PCR NEGATIVE NEGATIVE Final    Comment: (NOTE) SARS-CoV-2 target nucleic acids are NOT DETECTED.  The SARS-CoV-2 RNA is generally detectable in upper and lower respiratory specimens during the acute phase of infection. The lowest concentration  of SARS-CoV-2 viral copies this assay can detect is 250 copies / mL. A negative result does not preclude SARS-CoV-2 infection and should not be used as the sole basis for treatment or other patient management decisions.  A negative result may occur with improper specimen collection / handling, submission of specimen other than nasopharyngeal swab, presence of viral mutation(s) within the areas targeted by this assay, and inadequate number of viral copies (<250 copies / mL). A negative result must be combined with clinical observations, patient history,  and epidemiological information.  Fact Sheet for Patients:   RoadLapTop.co.za  Fact Sheet for Healthcare Providers: http://kim-miller.com/  This test is not yet approved or  cleared by the [place] FDA and has been authorized for detection and/or diagnosis of SARS-CoV-2 by FDA under an Emergency Use Authorization (EUA).  This EUA will remain in effect (meaning this test can be used) for the duration of the COVID-19 declaration under Section 564(b)(1) of the Act, 21 U.S.C. section 360bbb-3(b)(1), unless the authorization is terminated or revoked sooner.  Performed at [facility], [address] [place], [place] [address]   MRSA Next Gen by PCR, Nasal     Status: None   Collection Time: 08/16/22 11:22 PM   Specimen: Nasal Mucosa; Nasal Swab  Result Value Ref Range Status   MRSA by PCR Next Gen NOT DETECTED NOT DETECTED Final    Comment: (NOTE) The GeneXpert MRSA Assay (FDA approved for NASAL specimens only), is one component of a comprehensive MRSA colonization surveillance program. It is not intended to diagnose MRSA infection nor to guide or monitor treatment for MRSA infections. Test performance is not FDA approved in patients less than 75 years old. Performed at [facility], [address] [place], [place] [address]   Respiratory (~20 pathogens) panel by  PCR     Status: None   Collection Time: 08/17/22 12:55 AM   Specimen: Nasopharyngeal Swab; Respiratory  Result Value Ref Range Status   Adenovirus NOT DETECTED NOT DETECTED Final   Coronavirus 229E NOT DETECTED NOT DETECTED Final    Comment: (NOTE) The Coronavirus on the Respiratory Panel, DOES NOT test for the novel  Coronavirus (2019 nCoV)    Coronavirus HKU1 NOT DETECTED NOT DETECTED Final   Coronavirus NL63 NOT DETECTED NOT DETECTED Final   Coronavirus OC43 NOT DETECTED NOT DETECTED Final   Metapneumovirus NOT DETECTED NOT DETECTED Final   Rhinovirus / Enterovirus NOT DETECTED NOT DETECTED Final   Influenza A NOT DETECTED NOT DETECTED Final   Influenza B NOT DETECTED NOT DETECTED Final   Parainfluenza Virus 1 NOT DETECTED NOT DETECTED Final   Parainfluenza Virus 2 NOT DETECTED NOT DETECTED Final   Parainfluenza Virus 3 NOT DETECTED NOT DETECTED Final   Parainfluenza Virus 4 NOT DETECTED NOT DETECTED Final   Respiratory Syncytial Virus NOT DETECTED NOT DETECTED Final   Bordetella pertussis NOT DETECTED NOT DETECTED Final   Bordetella Parapertussis NOT DETECTED NOT DETECTED Final   Chlamydophila pneumoniae NOT DETECTED NOT DETECTED Final   Mycoplasma pneumoniae NOT DETECTED NOT DETECTED Final    Comment: Performed at [facility], [address], [place], [place] [address]         Radiology Studies: No results found.      Scheduled Meds:  atorvastatin  20 mg Oral QHS   Chlorhexidine Gluconate Cloth  6 each Topical Daily   citalopram  20 mg Oral Daily   cyanocobalamin  1,000 mcg Intramuscular Daily   Followed by   Melene Muller ON 08/26/2022] vitamin B-12  1,000 mcg Oral Daily   feeding supplement  237 mL Oral BID BM   fluconazole  150 mg Oral Q72H   fluticasone furoate-vilanterol  1 puff Inhalation Daily   folic acid  1 mg Oral Daily   guaiFENesin  600 mg Oral BID   insulin aspart  0-9 Units Subcutaneous Q4H   ipratropium-albuterol  3 mL Nebulization BID    levothyroxine  200 mcg Oral Q0600   multivitamin with minerals  1  tablet Oral Daily   pantoprazole  40 mg Oral Daily   predniSONE  30 mg Oral Q breakfast   Followed by   Melene Muller ON 08/25/2022] predniSONE  20 mg Oral Q breakfast   Followed by   Melene Muller ON 08/28/2022] predniSONE  10 mg Oral Q breakfast   rivaroxaban  20 mg Oral Q supper   saccharomyces boulardii  250 mg Oral BID   Continuous Infusions:  sodium chloride Stopped (08/17/22 1038)     LOS: 6 days    Time spent: 25 mins     Charise Killian, MD [facility] Pager [phone]  If 7PM-7AM, please contact night-coverage [email or web address] 08/22/2022, 8:19 AM

## 2022-08-22 NOTE — TOC Progression Note (Addendum)
Transition of Care [facility]) - Progression Note    Patient Details  Name: CLO ALDI MRN: [id number] Date of Birth: [birth year]  Transition of Care [facility]) CM/SW Contact  Darolyn Rua, [place] Phone Number: 08/22/2022, 11:40 AM  Clinical Narrative:     Update 5:02 pm: Patient daughter/family chose [facility], CSW has reached out to [facility] at [facility] to confirm bed offer for STR under Medicaid [place].    Update 1:05 pm: CSW notes that per [place] she reports they have discussed Short Term Rehab instead of long term, CSW informed her they would have to give up monthly Medicaid check and that she would need to come home after STR, she reports she understands and is going to meet with family this afternoon to determine facility choice for short term rehab.   CSW followed up with [name] regarding bed offers, bed offers provided. [name] reports she will speak with family and patient son to determine bed choice and will get back with CSW on choice.        Expected Discharge Plan and Services                                               Social Determinants of Health (SDOH) Interventions SDOH Screenings   Tobacco Use: High Risk (08/16/2022)    Readmission Risk Interventions    09/14/2021    3:21 PM 03/21/2021   10:52 AM 03/20/2021   12:14 PM  Readmission Risk Prevention Plan  Post Dischage Appt  Complete   Medication Screening  Complete Complete  Transportation Screening  Complete Complete  PCP or Specialist Appt within 3-5 Days Complete    HRI or Home Care Consult Complete    Social Work Consult for Recovery Care Planning/Counseling Complete    Palliative Care Screening Not Applicable    Medication Review Oceanographer) Complete

## 2022-08-22 NOTE — Progress Notes (Signed)
Physical Therapy Treatment Patient Details Name: Terri Casey MRN: [id number] DOB: 1960/11/17 Today's Date: 08/22/2022   History of Present Illness Patient is a 62 year old female with shortness of breath. 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 Extubated 08/18/22    PT Comments  Pt pleasant and motivated, though showed initial hesitancy to do a lot but after first bout of slow and labored ambulation of 55 ft she was motivated (after seated rest break and continued cuing for breathing, encouragement) to turn around and try to walk back to her room.  She was ultimately able to do so, but did endorse significant fatigue (though SpO2 appeared to stay in the 90s on 3L) and says "That's more than I've walked in a while."  Pt showing increased mobility, but continues to have general weakness.  Pt will benefit from continued PT, continue with POC.       If plan is discharge home, recommend the following: A little help with walking and/or transfers;A little help with bathing/dressing/bathroom;Help with stairs or ramp for entrance;Assist for transportation;Assistance with cooking/housework   Can travel by private Automotive engineer (2 wheels)    Recommendations for Other Services       Precautions / Restrictions Precautions Precautions: Fall     Mobility  Bed Mobility Overal bed mobility: Modified Independent Bed Mobility: Supine to Sit     Supine to sit: HOB elevated, Supervision          Transfers Overall transfer level: Needs assistance Equipment used: Rolling walker (2 wheels) Transfers: Sit to/from Stand Sit to Stand: Contact guard assist           General transfer comment: able to rise from standard height bed and from recliner w/o physical assist.  Cuing for set up and sequencing, needed AD to maintain balance once upright.     Ambulation/Gait Ambulation/Gait assistance: Contact guard assist Gait Distance (Feet): 55 Feet Assistive device: Rolling walker (2 wheels)         General Gait Details: 55 ft X 2, on 3L O2 (SpO2 difficult to consistently get but appeared to stay in the 90s) HR to 100-110 with the effort, reports fatigue but remains very motivated.  Pt did have some mild/low grade buckling in knees that she self arrested, did not need direct intervention from PT; improved with cuing.  Pt reliant on walker, does not use one at baseline.   Stairs             Wheelchair Mobility     Tilt Bed    Modified Rankin (Stroke Patients Only)       Balance Overall balance assessment: Needs assistance Sitting-balance support: Feet supported Sitting balance-Leahy Scale: Good     Standing balance support: Bilateral upper extremity supported, Reliant on assistive device for balance, During functional activity Standing balance-Leahy Scale: Fair Standing balance comment: increased reliance through UE                            Cognition Arousal: Alert Behavior During Therapy: WFL for tasks assessed/performed Overall Cognitive Status: Within Functional Limits for tasks assessed  Exercises      General Comments General comments (skin integrity, edema, etc.): Pt needed consistent cuing for purposeful/pursed lip breathing.  SpO2 low 90s t/o most of activity.      Pertinent Vitals/Pain Pain Assessment Pain Assessment: Faces Faces Pain Scale: Hurts a little bit Pain Descriptors / Indicators: Headache    Home Living                          Prior Function            PT Goals (current goals can now be found in the care plan section) Progress towards PT goals: Progressing toward goals    Frequency    Min 1X/week      PT Plan      Co-evaluation              AM-PAC PT "6 Clicks" Mobility    Outcome Measure  Help needed turning from your back to your side while in a flat bed without using bedrails?: None Help needed moving from lying on your back to sitting on the side of a flat bed without using bedrails?: A Little Help needed moving to and from a bed to a chair (including a wheelchair)?: A Little Help needed standing up from a chair using your arms (e.g., wheelchair or bedside chair)?: A Little Help needed to walk in hospital room?: A Little Help needed climbing 3-5 steps with a railing? : A Lot 6 Click Score: 18    End of Session Equipment Utilized During Treatment: Gait belt;Oxygen Activity Tolerance: Patient tolerated treatment well Patient left: with call bell/phone within reach;with family/visitor present;with chair alarm set Nurse Communication: Mobility status PT Visit Diagnosis: Unsteadiness on feet (R26.81);Muscle weakness (generalized) (M62.81)     Time: 9562-1308 PT Time Calculation (min) (ACUTE ONLY): 42 min  Charges:    $Gait Training: 23-37 mins $Therapeutic Exercise: 8-22 mins PT General Charges $$ ACUTE PT VISIT: 1 Visit                     Malachi Pro, DPT 08/22/2022, 10:36 AM

## 2022-08-23 DIAGNOSIS — J9622 Acute and chronic respiratory failure with hypercapnia: Secondary | ICD-10-CM | POA: Diagnosis not present

## 2022-08-23 DIAGNOSIS — J9621 Acute and chronic respiratory failure with hypoxia: Secondary | ICD-10-CM | POA: Diagnosis not present

## 2022-08-23 LAB — GLUCOSE, CAPILLARY
Glucose-Capillary: 113 mg/dL — ABNORMAL HIGH (ref 70–99)
Glucose-Capillary: 115 mg/dL — ABNORMAL HIGH (ref 70–99)
Glucose-Capillary: 125 mg/dL — ABNORMAL HIGH (ref 70–99)
Glucose-Capillary: 132 mg/dL — ABNORMAL HIGH (ref 70–99)
Glucose-Capillary: 74 mg/dL (ref 70–99)
Glucose-Capillary: 95 mg/dL (ref 70–99)
Glucose-Capillary: 99 mg/dL (ref 70–99)

## 2022-08-23 LAB — CBC
HCT: 32.9 % — ABNORMAL LOW (ref 36.0–46.0)
Hemoglobin: 10.2 g/dL — ABNORMAL LOW (ref 12.0–15.0)
MCH: 31.8 pg (ref 26.0–34.0)
MCHC: 31 g/dL (ref 30.0–36.0)
MCV: 102.5 fL — ABNORMAL HIGH (ref 80.0–100.0)
Platelets: 128 10*3/uL — ABNORMAL LOW (ref 150–400)
RBC: 3.21 MIL/uL — ABNORMAL LOW (ref 3.87–5.11)
RDW: 14.2 % (ref 11.5–15.5)
WBC: 5.4 10*3/uL (ref 4.0–10.5)
nRBC: 0 % (ref 0.0–0.2)

## 2022-08-23 LAB — BASIC METABOLIC PANEL
Anion gap: 6 (ref 5–15)
BUN: 16 mg/dL (ref 8–23)
CO2: 30 mmol/L (ref 22–32)
Calcium: 8.5 mg/dL — ABNORMAL LOW (ref 8.9–10.3)
Chloride: 102 mmol/L (ref 98–111)
Creatinine, Ser: 0.8 mg/dL (ref 0.44–1.00)
GFR, Estimated: >60 mL/min (ref >60)
Glucose, Bld: 97 mg/dL (ref 70–99)
Potassium: 4 mmol/L (ref 3.5–5.1)
Sodium: 138 mmol/L (ref 135–145)

## 2022-08-23 LAB — TRIGLYCERIDES: Triglycerides: 57 mg/dL (ref <150)

## 2022-08-23 NOTE — Progress Notes (Signed)
PT Cancellation Note  Patient Details Name: Terri Casey MRN: [id number] DOB: 02/21/1960   Cancelled Treatment:    Reason Eval/Treat Not Completed: Other (comment): Chart Reviewed. Patient asleep in bed upon PT's arrival, encouraged participation in therapy session and OOB activity. Patient refused at this time due to abdominal pain, rated 7/10. RN notified. Will re attempt at later date/time.    Howie Ill, PT, DPT 08/23/22 11:53 AM

## 2022-08-23 NOTE — TOC Progression Note (Signed)
Transition of Care [facility]) - Progression Note    Patient Details  Name: Terri Casey MRN: [id number] Date of Birth: [birth year]  Transition [facility]) CM/SW Contact  Darolyn Rua, [place] Phone Number: 08/23/2022, 9:52 AM  Clinical Narrative:      CSW spoke with Clydie Braun with [facility], she reports that they have reviewed patient's Medicaid and they can accept patient for short term rehab when medically stable.   MD made aware of above.       Expected Discharge Plan and Services                                               Social Determinants of Health (SDOH) Interventions SDOH Screenings   Tobacco Use: High Risk (08/16/2022)    Readmission Risk Interventions    09/14/2021    3:21 PM 03/21/2021   10:52 AM 03/20/2021   12:14 PM  Readmission Risk Prevention Plan  Post Dischage Appt  Complete   Medication Screening  Complete Complete  Transportation Screening  Complete Complete  PCP or Specialist Appt within 3-5 Days Complete    HRI or Home Care Consult Complete    Social Work Consult for Recovery Care Planning/Counseling Complete    Palliative Care Screening Not Applicable    Medication Review Oceanographer) Complete

## 2022-08-23 NOTE — Progress Notes (Signed)
PROGRESS NOTE    Terri Casey  ZOX:[id number] DOB: 1960/01/10 DOA: 08/16/2022 PCP: Karl Bales, NP    Assessment & Plan:   Principal Problem:   Acute on chronic respiratory failure (HCC)  Assessment and Plan: Toxic Metabolic Encephalopathy: likely secondary to CO2 narcosis. Mental status is close to baseline   CO2 Narcosis: Bipap prn. S/p intubation & extubation. Resolved    Acute on chronic hypoxic & hypercapnic respiratory failure: continue on supplemental oxygen, uses 3L [place] at baseline. S/p intubation & extubation.   COPD exacerbation: complete abx course. Continue on steroid taper, bronchodilators & encourage incentive spirometry  Chronic left pleural effusion: etiology unclear. CT chest: left sided infiltrate that appears chronic, with previous chest CT showing a left sided pleural effusion. No acute intervention needed at this time as pt is back on home 3L [place]    Diarrhea: likely secondary to abx use. Resolved    PAF: continue on xarelto. Not on any rate controlling meds   Chronic diastolic CHF: appears compensated. Monitor I/Os    Hyperglycemia: likely secondary to steroid, continue on taper. No hx of DM  Hypothyroidism: continue on levothyroxine   Depression: severity unknown. Continue w/ lexapro  Vaginal yeast infection: completed fluconazole course     Vitamin B12 deficiency: continue w/ B12 supplement        DVT prophylaxis: xarelto  Code Status: full  Family Communication:  Disposition Plan: will d/c to [facility] tomorrow   Level of care: Progressive Consultants:  ICU  Procedures:  Antimicrobials:  Subjective: Pt c/o intermittent shortness of breath   Objective: Vitals:   08/23/22 0317 08/23/22 0503 08/23/22 0716 08/23/22 0757  BP: 115/68   (!) 114/55  Pulse: 66 68  66  Resp: 16 12  16   Temp:    98 F (36.7 C)  TempSrc:      SpO2: 100% 96% 96% 100%  Weight:      Height:        Intake/Output Summary (Last 24 hours)  at 08/23/2022 0915 Last data filed at 08/22/2022 1100 Gross per 24 hour  Intake 0 ml  Output --  Net 0 ml   Filed Weights   08/20/22 0500 08/21/22 2129 08/22/22 0755  Weight: 100.9 kg 89 kg 98.2 kg    Examination:  General exam: Appears comfortable  Respiratory system: diminished breath sounds b/l  Cardiovascular system: S1/S2+. No rubs or clicks   Gastrointestinal system: Abd is soft, NT, obese & normal bowel sounds  Central nervous system: alert & awake.moves all extremities  Psychiatry: judgement and insight appears improved. Flat mood and affect    Data Reviewed: I have personally reviewed following labs and imaging studies  CBC: Recent Labs  Lab 08/16/22 2017 08/17/22 0603 08/19/22 0550 08/20/22 0501 08/21/22 0542 08/22/22 0705 08/23/22 0452  WBC 4.8   < > 8.7 6.8 5.6 5.3 5.4  NEUTROABS 3.6  --   --   --   --   --   --   HGB 12.8   < > 10.7* 8.7* 10.3* 10.6* 10.2*  HCT 45.9   < > 34.2* 26.7* 32.1* 34.3* 32.9*  MCV 115.0*   < > 101.8* 98.2 100.0 102.4* 102.5*  PLT 125*   < > 119* 103* 95* 120* 128*   < > = values in this interval not displayed.   Basic Metabolic Panel: Recent Labs  Lab 08/18/22 0510 08/19/22 0550 08/20/22 0501 08/21/22 0542 08/22/22 0705 08/23/22 0452  NA 138 140 143 139  137 138  K 4.3 4.2 3.9 3.7 3.9 4.0  CL 102 102 106 103 101 102  CO2 30 29 30 29 29 30   GLUCOSE 126* 97 93 95 90 97  BUN 24* 19 15 10 16 16   CREATININE 0.75 0.68 0.83 0.62 0.70 0.80  CALCIUM 8.8* 8.9 8.7* 8.9 8.8* 8.5*  MG 2.3 2.1 2.2 2.4 2.2  --   PHOS 3.8 3.4 3.7 3.7 4.1  --    GFR: Estimated Creatinine Clearance: 89.8 mL/min (by C-G formula based on SCr of 0.8 mg/dL). Liver Function Tests: Recent Labs  Lab 08/16/22 2017 08/18/22 0510 08/19/22 0550  AST 15  --   --   ALT 11  --   --   ALKPHOS 95  --   --   BILITOT 0.5  --   --   PROT 7.2  --   --   ALBUMIN 4.0 3.3* 3.5   No results for input(s): "LIPASE", "AMYLASE" in the last 168 hours. No results for  input(s): "AMMONIA" in the last 168 hours. Coagulation Profile: No results for input(s): "INR", "PROTIME" in the last 168 hours. Cardiac Enzymes: No results for input(s): "CKTOTAL", "CKMB", "CKMBINDEX", "TROPONINI" in the last 168 hours. BNP (last 3 results) No results for input(s): "PROBNP" in the last 8760 hours. HbA1C: No results for input(s): "HGBA1C" in the last 72 hours. CBG: Recent Labs  Lab 08/22/22 1559 08/22/22 1925 08/22/22 2359 08/23/22 0315 08/23/22 0826  GLUCAP 154* 168* 113* 95 74   Lipid Profile: Recent Labs    08/23/22 0452  TRIG 57   Thyroid Function Tests: No results for input(s): "TSH", "T4TOTAL", "FREET4", "T3FREE", "THYROIDAB" in the last 72 hours. Anemia Panel: No results for input(s): "VITAMINB12", "FOLATE", "FERRITIN", "TIBC", "IRON", "RETICCTPCT" in the last 72 hours.  Sepsis Labs: Recent Labs  Lab 08/16/22 2017 08/17/22 0603 08/18/22 0510  PROCALCITON <0.10 <0.10 <0.10    Recent Results (from the past 240 hour(s))  Blood culture (single)     Status: None   Collection Time: 08/16/22  8:17 PM   Specimen: BLOOD  Result Value Ref Range Status   Specimen Description BLOOD BLOOD LEFT ARM  Final   Special Requests   Final    BOTTLES DRAWN AEROBIC AND ANAEROBIC Blood Culture results may not be optimal due to an excessive volume of blood received in culture bottles   Culture   Final    NO GROWTH 5 DAYS Performed at [facility], [address], [place], [place] [address]    Report Status 08/21/2022 FINAL  Final  SARS Coronavirus 2 by RT PCR (hospital order, performed in [facility] hospital lab) *cepheid single result test* Anterior Nasal Swab     Status: None   Collection Time: 08/16/22  8:17 PM   Specimen: Anterior Nasal Swab  Result Value Ref Range Status   SARS Coronavirus 2 by RT PCR NEGATIVE NEGATIVE Final    Comment: (NOTE) SARS-CoV-2 target nucleic acids are NOT DETECTED.  The SARS-CoV-2 RNA is generally detectable in  upper and lower respiratory specimens during the acute phase of infection. The lowest concentration of SARS-CoV-2 viral copies this assay can detect is 250 copies / mL. A negative result does not preclude SARS-CoV-2 infection and should not be used as the sole basis for treatment or other patient management decisions.  A negative result may occur with improper specimen collection / handling, submission of specimen other than nasopharyngeal swab, presence of viral mutation(s) within the areas targeted by this  assay, and inadequate number of viral copies (<250 copies / mL). A negative result must be combined with clinical observations, patient history, and epidemiological information.  Fact Sheet for Patients:   RoadLapTop.co.za  Fact Sheet for Healthcare Providers: http://kim-miller.com/  This test is not yet approved or  cleared by the [place] FDA and has been authorized for detection and/or diagnosis of SARS-CoV-2 by FDA under an Emergency Use Authorization (EUA).  This EUA will remain in effect (meaning this test can be used) for the duration of the COVID-19 declaration under Section 564(b)(1) of the Act, 21 U.S.C. section 360bbb-3(b)(1), unless the authorization is terminated or revoked sooner.  Performed at [facility], [address] [place], [place] [address]   MRSA Next Gen by PCR, Nasal     Status: None   Collection Time: 08/16/22 11:22 PM   Specimen: Nasal Mucosa; Nasal Swab  Result Value Ref Range Status   MRSA by PCR Next Gen NOT DETECTED NOT DETECTED Final    Comment: (NOTE) The GeneXpert MRSA Assay (FDA approved for NASAL specimens only), is one component of a comprehensive MRSA colonization surveillance program. It is not intended to diagnose MRSA infection nor to guide or monitor treatment for MRSA infections. Test performance is not FDA approved in patients less than 59 years old. Performed at  [facility], [address] [place], [place] [address]   Respiratory (~20 pathogens) panel by PCR     Status: None   Collection Time: 08/17/22 12:55 AM   Specimen: Nasopharyngeal Swab; Respiratory  Result Value Ref Range Status   Adenovirus NOT DETECTED NOT DETECTED Final   Coronavirus 229E NOT DETECTED NOT DETECTED Final    Comment: (NOTE) The Coronavirus on the Respiratory Panel, DOES NOT test for the novel  Coronavirus (2019 nCoV)    Coronavirus HKU1 NOT DETECTED NOT DETECTED Final   Coronavirus NL63 NOT DETECTED NOT DETECTED Final   Coronavirus OC43 NOT DETECTED NOT DETECTED Final   Metapneumovirus NOT DETECTED NOT DETECTED Final   Rhinovirus / Enterovirus NOT DETECTED NOT DETECTED Final   Influenza A NOT DETECTED NOT DETECTED Final   Influenza B NOT DETECTED NOT DETECTED Final   Parainfluenza Virus 1 NOT DETECTED NOT DETECTED Final   Parainfluenza Virus 2 NOT DETECTED NOT DETECTED Final   Parainfluenza Virus 3 NOT DETECTED NOT DETECTED Final   Parainfluenza Virus 4 NOT DETECTED NOT DETECTED Final   Respiratory Syncytial Virus NOT DETECTED NOT DETECTED Final   Bordetella pertussis NOT DETECTED NOT DETECTED Final   Bordetella Parapertussis NOT DETECTED NOT DETECTED Final   Chlamydophila pneumoniae NOT DETECTED NOT DETECTED Final   Mycoplasma pneumoniae NOT DETECTED NOT DETECTED Final    Comment: Performed at [facility], [address], [place], [place] [address]         Radiology Studies: No results found.      Scheduled Meds:  atorvastatin  20 mg Oral QHS   Chlorhexidine Gluconate Cloth  6 each Topical Daily   cyanocobalamin  1,000 mcg Intramuscular Daily   Followed by   Melene Muller ON 08/26/2022] vitamin B-12  1,000 mcg Oral Daily   escitalopram  10 mg Oral Daily   feeding supplement  237 mL Oral BID BM   fluticasone furoate-vilanterol  1 puff Inhalation Daily   folic acid  1 mg Oral Daily   guaiFENesin  600 mg Oral BID   insulin aspart  0-9  Units Subcutaneous TID WC   ipratropium-albuterol  3 mL Nebulization BID  levothyroxine  200 mcg Oral Q0600   multivitamin with minerals  1 tablet Oral Daily   pantoprazole  40 mg Oral Daily   predniSONE  30 mg Oral Q breakfast   Followed by   Melene Muller ON 08/25/2022] predniSONE  20 mg Oral Q breakfast   Followed by   Melene Muller ON 08/28/2022] predniSONE  10 mg Oral Q breakfast   rivaroxaban  20 mg Oral Q supper   saccharomyces boulardii  250 mg Oral BID   Continuous Infusions:  sodium chloride Stopped (08/17/22 1038)     LOS: 7 days    Time spent: 25 mins     Charise Killian, MD [facility] Pager [phone]  If 7PM-7AM, please contact night-coverage [email or web address] 08/23/2022, 9:15 AM

## 2022-08-23 NOTE — Progress Notes (Signed)
Occupational Therapy Treatment Patient Details Name: Terri Casey MRN: [id number] DOB: 05/18/1960 Today's Date: 08/23/2022   History of present illness Patient is a 62 year old female with shortness of breath. 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 Extubated 08/18/22   OT comments  Ms Cosco was seen for OT treatment on this date. Upon arrival to room pt seated EOB, RN in room, pt agreeable to tx. Pt requires SUPERVISION for toilet t/f, pericare, and clothing mgmt, desat 80% on RA, resolved to 90s on 3L [place]. Educated on ECS. Pt making good progress toward goals, will continue to follow POC. Discharge recommendation remains appropriate.       If plan is discharge home, recommend the following:  A little help with walking and/or transfers;A little help with bathing/dressing/bathroom;Help with stairs or ramp for entrance   Equipment Recommendations  None recommended by OT    Recommendations for Other Services      Precautions / Restrictions Precautions Precautions: Fall Restrictions Weight Bearing Restrictions: No       Mobility Bed Mobility Overal bed mobility: Modified Independent                  Transfers Overall transfer level: Needs assistance Equipment used: None Transfers: Sit to/from Stand Sit to Stand: Supervision                 Balance Overall balance assessment: Needs assistance Sitting-balance support: Feet supported Sitting balance-Leahy Scale: Good     Standing balance support: No upper extremity supported, During functional activity Standing balance-Leahy Scale: Fair                             ADL either performed or assessed with clinical judgement   ADL Overall ADL's : Needs assistance/impaired                                       General ADL Comments: SUPERVISION for toilet t/f, pericare, and clothing  mgmt, desat 80% on RA, resolved to 90s on 3L [place]      Cognition Arousal: Alert Behavior During Therapy: WFL for tasks assessed/performed Overall Cognitive Status: Within Functional Limits for tasks assessed                                                     Pertinent Vitals/ Pain       Pain Assessment Pain Assessment: No/denies pain   Frequency  Min 1X/week        Progress Toward Goals  OT Goals(current goals can now be found in the care plan section)  Progress towards OT goals: Progressing toward goals  Acute Rehab OT Goals Patient Stated Goal: to go home OT Goal Formulation: With patient/family Time For Goal Achievement: 09/03/22 Potential to Achieve Goals: Good ADL Goals Pt Will Perform Grooming: with modified independence;standing Pt Will Perform Lower Body Dressing: with modified independence;sit to/from stand Pt Will Transfer to Toilet: with modified independence;ambulating;regular height toilet  Plan      Co-evaluation                 AM-PAC OT "6 Clicks" Daily Activity  Outcome Measure   Help from another person eating meals?: None Help from another person taking care of personal grooming?: A Little Help from another person toileting, which includes using toliet, bedpan, or urinal?: A Lot Help from another person bathing (including washing, rinsing, drying)?: A Little Help from another person to put on and taking off regular upper body clothing?: A Little Help from another person to put on and taking off regular lower body clothing?: A Lot 6 Click Score: 17    End of Session Equipment Utilized During Treatment: Oxygen  OT Visit Diagnosis: Other abnormalities of gait and mobility (R26.89);Muscle weakness (generalized) (M62.81)   Activity Tolerance Patient tolerated treatment well   Patient Left in bed;with call bell/phone within reach   Nurse Communication          Time: 9604-5409 OT Time Calculation (min): 8  min  Charges: OT General Charges $OT Visit: 1 Visit OT Treatments $Self Care/Home Management : 8-22 mins  Kathie Dike, M.S. OTR/L  08/23/22, 3:41 PM  ascom [phone]

## 2022-08-23 NOTE — Progress Notes (Signed)
Nutrition Follow-up  DOCUMENTATION CODES:   Not applicable  INTERVENTION:   -Continue with regular diet -Continue MVI with minerals daily -Continue Ensure Enlive po BID, each supplement provides 350 kcal and 20 grams of protein.   NUTRITION DIAGNOSIS:   Inadequate oral intake related to inability to eat (pt sedated and ventilated) as evidenced by NPO status.  Progressing; advanced to PO diet on 08/19/22  GOAL:   Patient will meet greater than or equal to 90% of their needs  Progressing   MONITOR:   PO intake, Supplement acceptance, Labs, Weight trends, I & O's, Skin  REASON FOR ASSESSMENT:   Ventilator    ASSESSMENT:   62 y/o female with h/o HLD, HTN, COPD, hypothyroidism, anxiety, depression and CHF who is admitted with COPD exacerbation.  Reviewed I/O's: 0 ml x 24 hours and +3.9 L since admission   Pt unavailable at time of visit. Attempted to speak with pt via call to hospital room phone, however, unable to reach.   Pt currently on a regular diet. Noted meal completions 50-100%. Pt is variable acceptance of Ensure supplements.   Wt has been stable since admission.   Per TOC notes, pt awaiting bed offer for SNF for short term rehab.   Medications reviewed and include vitamin B-12, folic acid, prednisone, and florastor.   Labs reviewed: CBGS: 74-168 (inpatient orders for glycemic control are 0-9 units insulin aspart TID with meals).    Diet Order:   Diet Order             Diet regular Room service appropriate? Yes; Fluid consistency: Thin  Diet effective now                   EDUCATION NEEDS:   No education needs have been identified at this time  Skin:  Skin Assessment: Reviewed RN Assessment  Last BM:  08/21/22  Height:   Ht Readings from Last 1 Encounters:  08/21/22 5\' 9"  (1.753 m)    Weight:   Wt Readings from Last 1 Encounters:  08/22/22 98.2 kg    Ideal Body Weight:  65.9 kg  BMI:  Body mass index is 31.97 kg/m.  Estimated  Nutritional Needs:   Kcal:  1800-2000  Protein:  100-115 grams  Fluid:  > 1.8 L    Levada Schilling, RD, LDN, CDCES Registered Dietitian II Certified Diabetes Care and Education Specialist Please refer to [facility] for RD and/or RD on-call/weekend/after hours pager

## 2022-08-24 ENCOUNTER — Other Ambulatory Visit (HOSPITAL_COMMUNITY): Payer: Self-pay

## 2022-08-24 DIAGNOSIS — J9621 Acute and chronic respiratory failure with hypoxia: Secondary | ICD-10-CM | POA: Diagnosis not present

## 2022-08-24 DIAGNOSIS — J9622 Acute and chronic respiratory failure with hypercapnia: Secondary | ICD-10-CM | POA: Diagnosis not present

## 2022-08-24 LAB — CBC
HCT: 32 % — ABNORMAL LOW (ref 36.0–46.0)
Hemoglobin: 10 g/dL — ABNORMAL LOW (ref 12.0–15.0)
MCH: 31.5 pg (ref 26.0–34.0)
MCHC: 31.3 g/dL (ref 30.0–36.0)
MCV: 100.9 fL — ABNORMAL HIGH (ref 80.0–100.0)
Platelets: 126 10*3/uL — ABNORMAL LOW (ref 150–400)
RBC: 3.17 MIL/uL — ABNORMAL LOW (ref 3.87–5.11)
RDW: 14.2 % (ref 11.5–15.5)
WBC: 4.9 10*3/uL (ref 4.0–10.5)
nRBC: 0 % (ref 0.0–0.2)

## 2022-08-24 LAB — GLUCOSE, CAPILLARY
Glucose-Capillary: 116 mg/dL — ABNORMAL HIGH (ref 70–99)
Glucose-Capillary: 79 mg/dL (ref 70–99)
Glucose-Capillary: 97 mg/dL (ref 70–99)

## 2022-08-24 LAB — BASIC METABOLIC PANEL WITH GFR
Anion gap: 11 (ref 5–15)
BUN: 15 mg/dL (ref 8–23)
CO2: 29 mmol/L (ref 22–32)
Calcium: 8.8 mg/dL — ABNORMAL LOW (ref 8.9–10.3)
Chloride: 100 mmol/L (ref 98–111)
Creatinine, Ser: 0.67 mg/dL (ref 0.44–1.00)
GFR, Estimated: >60 mL/min (ref >60)
Glucose, Bld: 104 mg/dL — ABNORMAL HIGH (ref 70–99)
Potassium: 4 mmol/L (ref 3.5–5.1)
Sodium: 140 mmol/L (ref 135–145)

## 2022-08-24 MED ORDER — OXYCODONE-ACETAMINOPHEN 10-325 MG PO TABS: 1.0000 | ORAL_TABLET | ORAL | 0 refills | Status: AC | PRN

## 2022-08-24 MED ORDER — PREDNISONE 10 MG PO TABS: ORAL_TABLET | ORAL | 0 refills | Status: DC

## 2022-08-24 MED ORDER — TOPIRAMATE 100 MG PO TABS
100.0000 mg | ORAL_TABLET | Freq: Two times a day (BID) | ORAL | Status: DC
  Administered 2022-08-24: 100 mg via ORAL
  Filled 2022-08-24: qty 1

## 2022-08-24 MED ORDER — BETHANECHOL CHLORIDE 10 MG PO TABS
5.0000 mg | ORAL_TABLET | Freq: Two times a day (BID) | ORAL | Status: DC
  Administered 2022-08-24: 5 mg via ORAL
  Filled 2022-08-24: qty 0.5

## 2022-08-24 MED ORDER — CYANOCOBALAMIN 1000 MCG PO TABS: 1000.0000 ug | ORAL_TABLET | Freq: Every day | ORAL | 0 refills | Status: AC

## 2022-08-24 NOTE — TOC Benefit Eligibility Note (Signed)
Pharmacy Patient Advocate Encounter  Insurance verification completed.    The patient is insured through [facility] MEDICAID.     Ran test claim for Trelegy Ellipta and requires Prior Auth   This test claim was processed through [employer]- copay amounts may vary at other pharmacies due to [employer], or as the patient moves through the different stages of their insurance plan.

## 2022-08-24 NOTE — TOC Transition Note (Addendum)
Transition of Care [facility]) - CM/SW Discharge Note   Patient Details  Name: EZELLE MCELHONE MRN: [id number] Date of Birth: [birth year]  Transition of Care [facility]) CM/SW Contact:  Darolyn Rua, LCSW Phone Number: 08/24/2022, 1:09 PM   Clinical Narrative:      Patient will DC to: [facility] Anticipated DC date: 08/24/22 Transport by: [facility] Attempted to call family [name], no answer, vm is full unable to lvm  Per MD patient ready for DC to [facility] . RN, patient, patient's family, and facility notified of DC. Discharge Summary sent to facility. RN given number for report   [phone]. DC packet on chart. Ambulance transport requested for patient.  CSW signing off.  Angeline Slim, LCSW  Final next level of care: Skilled Nursing Facility Barriers to Discharge: No Barriers Identified   Patient Goals and CMS Choice CMS Medicare.gov Compare Post Acute Care list provided to:: Patient Choice offered to / list presented to : Patient  Discharge Placement                         Discharge Plan and Services Additional resources added to the After Visit Summary for                                       Social Determinants of Health (SDOH) Interventions SDOH Screenings   Tobacco Use: High Risk (08/16/2022)     Readmission Risk Interventions    09/14/2021    3:21 PM 03/21/2021   10:52 AM 03/20/2021   12:14 PM  Readmission Risk Prevention Plan  Post Dischage Appt  Complete   Medication Screening  Complete Complete  Transportation Screening  Complete Complete  PCP or Specialist Appt within 3-5 Days Complete    HRI or Home Care Consult Complete    Social Work Consult for Recovery Care Planning/Counseling Complete    Palliative Care Screening Not Applicable    Medication Review [employer]) Complete

## 2022-08-24 NOTE — Progress Notes (Signed)
Physical Therapy Treatment Patient Details Name: Terri Casey MRN: [id number] DOB: November 12, 1960 Today's Date: 08/24/2022   History of Present Illness Patient is a 62 year old female with shortness of breath. 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 Extubated 08/18/22    PT Comments  Pt doubled AMB distance tolerance since 2 days prior PT session, AMB 144ft twice on 4L/min. Pt able to perform multiple transfers and AMB without device today, as per baseline. DOE remains a significant limitation to activity. Hr well controlled with activity.    If plan is discharge home, recommend the following: A little help with walking and/or transfers;A little help with bathing/dressing/bathroom;Help with stairs or ramp for entrance;Assist for transportation;Assistance with cooking/housework   Can travel by private Scientist, research (medical) walker (2 wheels)    Recommendations for Other Services       Precautions / Restrictions Precautions Precautions: Fall Restrictions Weight Bearing Restrictions: No     Mobility  Bed Mobility Overal bed mobility: Modified Independent                  Transfers Overall transfer level: Modified independent Equipment used: None                    Ambulation/Gait   Gait Distance (Feet): 100 Feet (17ft twice with dyspnea recovery between)               Stairs             Wheelchair Mobility     Tilt Bed    Modified Rankin (Stroke Patients Only)       Balance                                            Cognition                                                Exercises Other Exercises Other Exercises: 5x STS from EOB slight elevation, nodevice (dyspnea and 91% at end on 3L) Other Exercises: on 4L, 110ft in room twice (once with RW, once without)     General Comments        Pertinent Vitals/Pain Pain Assessment Pain Assessment: No/denies pain    Home Living                          Prior Function            PT Goals (current goals can now be found in the care plan section) Acute Rehab PT Goals Patient Stated Goal: to get stonger PT Goal Formulation: With patient Time For Goal Achievement: 09/03/22 Potential to Achieve Goals: Good Progress towards PT goals: Progressing toward goals    Frequency    Min 1X/week      PT Plan      Co-evaluation              AM-PAC PT "6 Clicks" Mobility   Outcome Measure  Help needed turning from your back to your side while in a flat bed without using bedrails?: None Help needed moving from lying on your  back to sitting on the side of a flat bed without using bedrails?: None Help needed moving to and from a bed to a chair (including a wheelchair)?: None Help needed standing up from a chair using your arms (e.g., wheelchair or bedside chair)?: None Help needed to walk in hospital room?: A Little Help needed climbing 3-5 steps with a railing? : A Little 6 Click Score: 22    End of Session Equipment Utilized During Treatment: Oxygen Activity Tolerance: Patient tolerated treatment well;No increased pain;Patient limited by fatigue Patient left: with call bell/phone within reach;with family/visitor present;with chair alarm set Nurse Communication: Mobility status PT Visit Diagnosis: Unsteadiness on feet (R26.81);Muscle weakness (generalized) (M62.81)     Time: 4540-9811 PT Time Calculation (min) (ACUTE ONLY): 23 min  Charges:    $Therapeutic Activity: 23-37 mins PT General Charges $$ ACUTE PT VISIT: 1 Visit                    11:52 AM, 08/24/22 Rosamaria Lints, PT, DPT Physical Therapist - [facility]  [phone] (ASCOM)    Ardeth Repetto C 08/24/2022, 11:50 AM

## 2022-08-24 NOTE — Progress Notes (Signed)
   08/23/22 2338  BiPAP/CPAP/SIPAP  BiPAP/CPAP/SIPAP Pt Type Adult  BiPAP/CPAP/SIPAP V60  Mask Type Full face mask  Mask Size Large  Set Rate 10 breaths/min  Respiratory Rate 21 breaths/min  IPAP 12 cmH20  EPAP 6 cmH2O  FiO2 (%) 30 %  Minute Ventilation 11.5  Leak 0  Peak Inspiratory Pressure (PIP) 13  Tidal Volume (Vt) 520  Patient Home Equipment No  Auto Titrate No  Press High Alarm 25 cmH2O  Press Low Alarm 2 cmH2O    Darolyn Rua, [place], MSW, [place] [phone]

## 2022-08-24 NOTE — Discharge Summary (Signed)
Physician Discharge Summary  Terri Casey QIO:[id number] DOB: 1960-04-06 DOA: 08/16/2022  PCP: Terri Bales, NP  Admit date: 08/16/2022 Discharge date: 08/24/2022  Admitted From: home  Disposition:  SNF  Recommendations for Outpatient Follow-up:  Follow up with PCP in 1-2 weeks   Home Health: no  Equipment/Devices:  Discharge Condition: stable  CODE STATUS: Full  Diet recommendation: Regular   Brief/Interim Summary: HPI was taken from NP Terri Casey: 62 yo F presenting to [facility] ED on 08/16/22 for evaluation of worsening dyspnea.   History obtained via chart review and telephone report from daughter-in-law, Terri Casey. Daughter-in-law describes the patient as being in her baseline state of health until 08/13/2022.  The patient has had progressive shortness of breath and poor p.o. intake.  On the morning of 08/16/2022 she seemed more dyspneic and lethargic, she described her lips as "changing color".  They increased her nasal cannula oxygen to 5 L from 3-4, and the patient appeared to recover.  Later in the evening she had a similar episode becoming increasingly dyspneic, lethargic with changes to her coloration.  Daughter-in-law called EMS when she noted her SpO2 to be in the 70's. The patient lives with her daughter-in-law and son, they both feel the patient would benefit from being in a skilled nursing facility.  At first Terri Casey reported the patient " does not take her medication the way she supposed to".  However after further questions, the patient is consistently taking her medication but not with food or during the exact times of dates prescribed.  Terri Casey confirmed she has been taking her Xarelto and her inhalers.  She has been using her rescue inhaler about 3-4 times a day additionally.  Terri Casey denied any complaints of fever/chills, nausea/vomiting/diarrhea/abdominal pain, falls/loss of consciousness, or new productive cough.  She also confirmed the patient has not been around  anyone ill. Terri Casey describes a recent yeast infection diagnosis the patient was prescribed fluconazole, completing all but the last 2 doses. She reports current everyday smoking, 2-3 cigarettes daily for the last 40+ years. She denies any regular EtOH or recreational drug use.  She confirms that her outpatient regimen for anxiety has been working well.   ED course: Upon arrival patient was lethargic placed on BIPAP support, patient had mild tachypnea but otherwise vital signs stable on 40% FiO2.  At first the patient appeared to improve on BiPAP support but then became increasingly agitated and confused prompting urgent intubation and mechanical ventilatory support.  Labs significant for respiratory acidosis and elevated MCV with mild thrombocytopenia.  (At baseline the patient has macrocytic anemia) Medications given: Duo neb x 3, solu medrol 125 mg, 2 g Mg, fentanyl/ succinylcholine/ etomidate, 1 L NS bolus, propofol drip Initial Vitals: 97.8, 21, 77, 119/76, 97% on BIPAP @ 40% Significant labs: (Labs/ Imaging personally reviewed) I, Terri Casey, AGACNP-BC, personally viewed and interpreted this ECG. EKG Interpretation: Date: 08/16/2022, EKG Time: 23:19, Rate: 65, Rhythm: NSR, QRS Axis: Borderline RAD, Intervals: Normal, ST/T Wave abnormalities: Mild inferior STE, Narrative Interpretation: NSR with borderline RAD and mild inferior ST that does not meet STEMI criteria Chemistry: Na+: 143, K+: 4.3, BUN/Cr.: 10/0.67, Serum CO2/ AG: 41/ 5, Cl: 97 Hematology: WBC: 4.8, Hgb: 12.8, MCV: 115, Plt: 125  Troponin: 13 > 14, BNP: 53, PCT: <0.10, COVID-19: negative VBG: 7.14/ >123/ <31/ 47   CXR 08/16/22: Mild Lingular scarring/atelectasis, no other cardiopulmonary abnormalities. CT head wo contrast 08/16/22: No acute intracranial abnormality   PCCM consulted for admission due to acute on  chronic hypoxic/hypercapnic respiratory failure secondary to AECOPD requiring urgent intubation and mechanical  ventilatory support.   As per Dr. Aundria Casey: 08/16/22: Admit to ICU with acute on chronic hypoxic/hypercapnic respiratory failure s/t AECOPD, failed BIPAP and intubated in ED requiring mechanical ventilatory support. 08/17/22: remains intubated 08/18/22: follows commands, passing SBT  08/18/22: pt was extubated & downgraded under TRH on 08/19/2022    Discharge Diagnoses:  Principal Problem:   Acute on chronic respiratory failure (HCC)  Toxic Metabolic Encephalopathy: likely secondary to CO2 narcosis. Resolved   CO2 Narcosis: Bipap prn. S/p intubation & extubation. Resolved    Acute on chronic hypoxic & hypercapnic respiratory failure: continue on supplemental oxygen, uses 3L [place] at baseline. S/p intubation & extubation. BiPAP prn qhs on ST mode   COPD exacerbation: complete abx course. Continue on steroid taper, bronchodilators & encourage incentive spirometry   Chronic left pleural effusion: etiology unclear. CT chest: left sided infiltrate that appears chronic, with previous chest CT showing a left sided pleural effusion. No acute intervention needed at this time as pt is back on home 3L [place]    Diarrhea: likely secondary to abx use. Resolved    PAF: continue on xarelto. Not on any rate controlling meds    Chronic diastolic CHF: appears compensated. Monitor I/Os    Hyperglycemia: likely secondary to steroid, continue on taper. No hx of DM   Hypothyroidism: continue on levothyroxine    Depression: severity unknown. Continue w/ home anti-depression meds   Vaginal yeast infection: completed fluconazole course     Vitamin B12 deficiency: continue w/ B12 supplement     Discharge Instructions  Discharge Instructions     Diet general   Complete by: As directed    Discharge instructions   Complete by: As directed    F/u w/ PCP in 1-2 weeks   Increase activity slowly   Complete by: As directed       Allergies as of 08/24/2022       Reactions   Estrogens Hives   Mustard Hives    Strawberry Extract Hives        Medication List     TAKE these medications    albuterol (2.5 MG/3ML) 0.083% nebulizer solution Commonly known as: PROVENTIL Take 3 mLs (2.5 mg total) by nebulization every 6 (six) hours as needed for wheezing or shortness of breath.   albuterol 108 (90 Base) MCG/ACT inhaler Commonly known as: ProAir HFA 2 puffs every 4 hours as needed only  if your can't catch your breath   atorvastatin 20 MG tablet Commonly known as: LIPITOR Take 20 mg by mouth at bedtime.   bethanechol 5 MG tablet Commonly known as: URECHOLINE Take 5 mg by mouth 2 (two) times daily.   citalopram 20 MG tablet Commonly known as: CELEXA Take 20 mg by mouth daily.   cyanocobalamin 1000 MCG tablet Take 1 tablet (1,000 mcg total) by mouth daily. Start taking on: August 26, 2022   cyclobenzaprine 5 MG tablet Commonly known as: FLEXERIL Take 5 mg by mouth 3 (three) times daily as needed for muscle spasms.   escitalopram 10 MG tablet Commonly known as: LEXAPRO Take 10 mg by mouth daily.   fluticasone-salmeterol 500-50 MCG/ACT Aepb Commonly known as: ADVAIR Inhale 1 puff into the lungs in the morning and at bedtime.   folic acid 1 MG tablet Commonly known as: FOLVITE Take 1 tablet (1 mg total) by mouth daily.   levothyroxine 200 MCG tablet Commonly known as: SYNTHROID Take 1 tablet (200  mcg total) by mouth daily at 6 (six) AM.   loratadine 10 MG tablet Commonly known as: CLARITIN Take 10 mg by mouth daily.   metoprolol tartrate 25 MG tablet Commonly known as: LOPRESSOR Take 12.5 mg by mouth 2 (two) times daily.   oxyCODONE-acetaminophen 10-325 MG tablet Commonly known as: PERCOCET Take 1 tablet by mouth every 4 (four) hours as needed for up to 1 day for pain. What changed:  when to take this reasons to take this   pantoprazole 40 MG tablet Commonly known as: PROTONIX Take 1 tablet (40 mg total) by mouth daily.   predniSONE 10 MG tablet Commonly known  as: DELTASONE 20mg  daily x 3 days, then 10mg  daily x 3 days then stop   SUMAtriptan 50 MG tablet Commonly known as: IMITREX Take 50 mg by mouth every 2 (two) hours as needed for migraine.   topiramate 100 MG tablet Commonly known as: TOPAMAX Take 100 mg by mouth 2 (two) times daily.   Vitamin D (Ergocalciferol) 1.25 MG (50000 UNIT) Caps capsule Commonly known as: DRISDOL Take 50,000 Units by mouth once a week.   Xarelto 20 MG Tabs tablet Generic drug: rivaroxaban Take 20 mg by mouth daily.        Allergies  Allergen Reactions   Estrogens Hives   Mustard Hives   Strawberry Extract Hives    Consultations: ICU   Procedures/Studies: DG Chest Port 1 View  Result Date: 08/18/2022 CLINICAL DATA:  Acute on chronic respiratory failure with hypoxia. EXAM: PORTABLE CHEST 1 VIEW COMPARISON:  08/16/2022 FINDINGS: The cardio pericardial silhouette is enlarged. Left base atelectasis or infiltrate is more conspicuous than before. Endotracheal tube tip is 5.7 cm above the base of the carina. The NG tube passes into the stomach although the distal tip position is not included on the film. Telemetry leads overlie the chest. IMPRESSION: Increasing left base atelectasis or infiltrate. Electronically Signed   By: Kennith Center M.D.   On: 08/18/2022 08:56   US Venous Img Lower Bilateral (DVT)  Result Date: 08/17/2022 CLINICAL DATA:  Respiratory failure with hypoxia EXAM: Bilateral lower Extremity Venous Doppler Ultrasound TECHNIQUE: Gray-scale sonography with compression, as well as color and duplex ultrasound, were performed to evaluate the deep venous system(s) from the level of the common femoral vein through the popliteal and proximal calf veins. COMPARISON:  None available FINDINGS: VENOUS Normal compressibility of the common femoral, superficial femoral, and popliteal veins, as well as the visualized calf veins. Visualized portions of profunda femoral vein and great saphenous vein  unremarkable. No filling defects to suggest DVT on grayscale or color Doppler imaging. Doppler waveforms show normal direction of venous flow, normal respiratory plasticity and response to augmentation. OTHER None. Limitations: none IMPRESSION: No  lower extremity DVT. Electronically Signed   By: Acquanetta Belling M.D.   On: 08/17/2022 16:05   CT HEAD WO CONTRAST ( )  Result Date: 08/16/2022 CLINICAL DATA:  Trauma, hypoxia EXAM: CT HEAD WITHOUT CONTRAST TECHNIQUE: Contiguous axial images were obtained from the base of the skull through the vertex without intravenous contrast. RADIATION DOSE REDUCTION: This exam was performed according to the departmental dose-optimization program which includes automated exposure control, adjustment of the mA and/or kV according to patient size and/or use of iterative reconstruction technique. COMPARISON:  12/14/2021 FINDINGS: Brain: No evidence of acute infarction, hemorrhage, hydrocephalus, extra-axial collection or mass lesion/mass effect. Vascular: No hyperdense vessel or unexpected calcification. Skull: Normal. Negative for fracture or focal lesion. Sinuses/Orbits: The visualized paranasal sinuses are  essentially clear. The mastoid air cells are unopacified. Other: None. IMPRESSION: Normal head CT. Electronically Signed   By: Charline Bills M.D.   On: 08/16/2022 23:08   DG Chest Portable 1 View  Result Date: 08/16/2022 CLINICAL DATA:  ETT placement EXAM: PORTABLE CHEST 1 VIEW COMPARISON:  08/16/2022 at 2020 hours FINDINGS: Endotracheal tube terminates 5 cm above the carina. Lungs are clear.  No pleural effusion or pneumothorax. The heart is normal in size. Enteric tube courses into the stomach. IMPRESSION: Endotracheal tube terminates 5 cm above the carina. Electronically Signed   By: Charline Bills M.D.   On: 08/16/2022 22:56   DG Chest Portable 1 View  Result Date: 08/16/2022 CLINICAL DATA:  Shortness of breath EXAM: PORTABLE CHEST 1 VIEW COMPARISON:   12/14/2021 FINDINGS: Mild lingular scarring/atelectasis. Lungs otherwise clear. No pleural effusion or pneumothorax. Heart is normal in size. IMPRESSION: No acute cardiopulmonary disease. Electronically Signed   By: Charline Bills M.D.   On: 08/16/2022 21:01   (Echo, Carotid, EGD, Colonoscopy, ERCP)    Subjective: pt c/o malaise   Discharge Exam: Vitals:   08/24/22 0732 08/24/22 1105  BP: 118/75 127/72  Pulse: 71 80  Resp: 20 18  Temp: 98.7 F (37.1 C) 98 F (36.7 C)  SpO2: 100% 95%   Vitals:   08/24/22 0356 08/24/22 0730 08/24/22 0732 08/24/22 1105  BP: (!) 108/50  118/75 127/72  Pulse: 61  71 80  Resp: 18  20 18   Temp: 98.5 F (36.9 C)  98.7 F (37.1 C) 98 F (36.7 C)  TempSrc:   Axillary Oral  SpO2: 100% 95% 100% 95%  Weight:      Height:        General: Pt is alert, awake, not in acute distress Cardiovascular: S1/S2 +, no rubs, no gallops Respiratory: decreased breath sounds b/l Abdominal: Soft, NT, obese, bowel sounds + Extremities: no edema, no cyanosis    The results of significant diagnostics from this hospitalization (including imaging, microbiology, ancillary and laboratory) are listed below for reference.     Microbiology: Recent Results (from the past 240 hour(s))  Blood culture (single)     Status: None   Collection Time: 08/16/22  8:17 PM   Specimen: BLOOD  Result Value Ref Range Status   Specimen Description BLOOD BLOOD LEFT ARM  Final   Special Requests   Final    BOTTLES DRAWN AEROBIC AND ANAEROBIC Blood Culture results may not be optimal due to an excessive volume of blood received in culture bottles   Culture   Final    NO GROWTH 5 DAYS Performed at [facility], [address], [place], [place] [address]    Report Status 08/21/2022 FINAL  Final  SARS Coronavirus 2 by RT PCR (hospital order, performed in [facility] hospital lab) *cepheid single result test* Anterior Nasal Swab     Status: None   Collection Time: 08/16/22   8:17 PM   Specimen: Anterior Nasal Swab  Result Value Ref Range Status   SARS Coronavirus 2 by RT PCR NEGATIVE NEGATIVE Final    Comment: (NOTE) SARS-CoV-2 target nucleic acids are NOT DETECTED.  The SARS-CoV-2 RNA is generally detectable in upper and lower respiratory specimens during the acute phase of infection. The lowest concentration of SARS-CoV-2 viral copies this assay can detect is 250 copies / mL. A negative result does not preclude SARS-CoV-2 infection and should not be used as the sole basis for treatment or other patient management decisions.  A  negative result may occur with improper specimen collection / handling, submission of specimen other than nasopharyngeal swab, presence of viral mutation(s) within the areas targeted by this assay, and inadequate number of viral copies (<250 copies / mL). A negative result must be combined with clinical observations, patient history, and epidemiological information.  Fact Sheet for Patients:   RoadLapTop.co.za  Fact Sheet for Healthcare Providers: http://kim-miller.com/  This test is not yet approved or  cleared by the [place] FDA and has been authorized for detection and/or diagnosis of SARS-CoV-2 by FDA under an Emergency Use Authorization (EUA).  This EUA will remain in effect (meaning this test can be used) for the duration of the COVID-19 declaration under Section 564(b)(1) of the Act, 21 U.S.C. section 360bbb-3(b)(1), unless the authorization is terminated or revoked sooner.  Performed at [facility] [place] [facility], [address] [place], [place] [address]   MRSA Next Gen by PCR, Nasal     Status: None   Collection Time: 08/16/22 11:22 PM   Specimen: Nasal Mucosa; Nasal Swab  Result Value Ref Range Status   MRSA by PCR Next Gen NOT DETECTED NOT DETECTED Final    Comment: (NOTE) The GeneXpert MRSA Assay (FDA approved for NASAL specimens only), is one component of a  comprehensive MRSA colonization surveillance program. It is not intended to diagnose MRSA infection nor to guide or monitor treatment for MRSA infections. Test performance is not FDA approved in patients less than 60 years old. Performed at [facility], [address] [place] [address] [place], [place] [address]   Respiratory (~20 pathogens) panel by PCR     Status: None   Collection Time: 08/17/22 12:55 AM   Specimen: Nasopharyngeal Swab; Respiratory  Result Value Ref Range Status   Adenovirus NOT DETECTED NOT DETECTED Final   Coronavirus 229E NOT DETECTED NOT DETECTED Final    Comment: (NOTE) The Coronavirus on the Respiratory Panel, DOES NOT test for the novel  Coronavirus (2019 nCoV)    Coronavirus HKU1 NOT DETECTED NOT DETECTED Final   Coronavirus NL63 NOT DETECTED NOT DETECTED Final   Coronavirus OC43 NOT DETECTED NOT DETECTED Final   Metapneumovirus NOT DETECTED NOT DETECTED Final   Rhinovirus / Enterovirus NOT DETECTED NOT DETECTED Final   Influenza A NOT DETECTED NOT DETECTED Final   Influenza B NOT DETECTED NOT DETECTED Final   Parainfluenza Virus 1 NOT DETECTED NOT DETECTED Final   Parainfluenza Virus 2 NOT DETECTED NOT DETECTED Final   Parainfluenza Virus 3 NOT DETECTED NOT DETECTED Final   Parainfluenza Virus 4 NOT DETECTED NOT DETECTED Final   Respiratory Syncytial Virus NOT DETECTED NOT DETECTED Final   Bordetella pertussis NOT DETECTED NOT DETECTED Final   Bordetella Parapertussis NOT DETECTED NOT DETECTED Final   Chlamydophila pneumoniae NOT DETECTED NOT DETECTED Final   Mycoplasma pneumoniae NOT DETECTED NOT DETECTED Final    Comment: Performed at [facility], [address], [place], [place] [address]     Labs: BNP (last 3 results) Recent Labs    09/11/21 0446 09/16/21 0650 08/16/22 2017  BNP 126.0* 102.1* 53.0   Basic Metabolic Panel: Recent Labs  Lab 08/18/22 0510 08/19/22 0550 08/20/22 0501 08/21/22 0542 08/22/22 0705 08/23/22 0452  08/24/22 0413  NA 138 140 143 139 137 138 140  K 4.3 4.2 3.9 3.7 3.9 4.0 4.0  CL 102 102 106 103 101 102 100  CO2 30 29 30 29 29 30 29   GLUCOSE 126* 97 93 95 90 97 104*  BUN 24*  19 15 10 16 16 15   CREATININE 0.75 0.68 0.83 0.62 0.70 0.80 0.67  CALCIUM 8.8* 8.9 8.7* 8.9 8.8* 8.5* 8.8*  MG 2.3 2.1 2.2 2.4 2.2  --   --   PHOS 3.8 3.4 3.7 3.7 4.1  --   --    Liver Function Tests: Recent Labs  Lab 08/18/22 0510 08/19/22 0550  ALBUMIN 3.3* 3.5   No results for input(s): "LIPASE", "AMYLASE" in the last 168 hours. No results for input(s): "AMMONIA" in the last 168 hours. CBC: Recent Labs  Lab 08/20/22 0501 08/21/22 0542 08/22/22 0705 08/23/22 0452 08/24/22 0413  WBC 6.8 5.6 5.3 5.4 4.9  HGB 8.7* 10.3* 10.6* 10.2* 10.0*  HCT 26.7* 32.1* 34.3* 32.9* 32.0*  MCV 98.2 100.0 102.4* 102.5* 100.9*  PLT 103* 95* 120* 128* 126*   Cardiac Enzymes: No results for input(s): "CKTOTAL", "CKMB", "CKMBINDEX", "TROPONINI" in the last 168 hours. BNP: Invalid input(s): "POCBNP" CBG: Recent Labs  Lab 08/23/22 2006 08/23/22 2332 08/24/22 0352 08/24/22 0733 08/24/22 1106  GLUCAP 125* 132* 116* 79 97   D-Dimer No results for input(s): "DDIMER" in the last 72 hours. Hgb A1c No results for input(s): "HGBA1C" in the last 72 hours. Lipid Profile Recent Labs    08/23/22 0452  TRIG 57   Thyroid function studies No results for input(s): "TSH", "T4TOTAL", "T3FREE", "THYROIDAB" in the last 72 hours.  Invalid input(s): "FREET3" Anemia work up No results for input(s): "VITAMINB12", "FOLATE", "FERRITIN", "TIBC", "IRON", "RETICCTPCT" in the last 72 hours. Urinalysis    Component Value Date/Time   COLORURINE AMBER (A) 08/16/2022 2237   APPEARANCEUR CLOUDY (A) 08/16/2022 2237   LABSPEC 1.018 08/16/2022 2237   PHURINE 5.0 08/16/2022 2237   GLUCOSEU NEGATIVE 08/16/2022 2237   HGBUR SMALL (A) 08/16/2022 2237   BILIRUBINUR NEGATIVE 08/16/2022 2237   KETONESUR NEGATIVE 08/16/2022 2237    PROTEINUR 100 (A) 08/16/2022 2237   NITRITE NEGATIVE 08/16/2022 2237   LEUKOCYTESUR NEGATIVE 08/16/2022 2237   Sepsis Labs Recent Labs  Lab 08/21/22 0542 08/22/22 0705 08/23/22 0452 08/24/22 0413  WBC 5.6 5.3 5.4 4.9   Microbiology Recent Results (from the past 240 hour(s))  Blood culture (single)     Status: None   Collection Time: 08/16/22  8:17 PM   Specimen: BLOOD  Result Value Ref Range Status   Specimen Description BLOOD BLOOD LEFT ARM  Final   Special Requests   Final    BOTTLES DRAWN AEROBIC AND ANAEROBIC Blood Culture results may not be optimal due to an excessive volume of blood received in culture bottles   Culture   Final    NO GROWTH 5 DAYS Performed at [facility], [address] [place], [place] [address]    Report Status 08/21/2022 FINAL  Final  SARS Coronavirus 2 by RT PCR (hospital order, performed in [facility] hospital lab) *cepheid single result test* Anterior Nasal Swab     Status: None   Collection Time: 08/16/22  8:17 PM   Specimen: Anterior Nasal Swab  Result Value Ref Range Status   SARS Coronavirus 2 by RT PCR NEGATIVE NEGATIVE Final    Comment: (NOTE) SARS-CoV-2 target nucleic acids are NOT DETECTED.  The SARS-CoV-2 RNA is generally detectable in upper and lower respiratory specimens during the acute phase of infection. The lowest concentration of SARS-CoV-2 viral copies this assay can detect is 250 copies / mL. A negative result does not preclude SARS-CoV-2 infection and should not be used as the sole basis for treatment  or other patient management decisions.  A negative result may occur with improper specimen collection / handling, submission of specimen other than nasopharyngeal swab, presence of viral mutation(s) within the areas targeted by this assay, and inadequate number of viral copies (<250 copies / mL). A negative result must be combined with clinical observations, patient history, and epidemiological  information.  Fact Sheet for Patients:   RoadLapTop.co.za  Fact Sheet for Healthcare Providers: http://kim-miller.com/  This test is not yet approved or  cleared by the [place] FDA and has been authorized for detection and/or diagnosis of SARS-CoV-2 by FDA under an Emergency Use Authorization (EUA).  This EUA will remain in effect (meaning this test can be used) for the duration of the COVID-19 declaration under Section 564(b)(1) of the Act, 21 U.S.C. section 360bbb-3(b)(1), unless the authorization is terminated or revoked sooner.  Performed at [facility], [address] [place], [place] [address]   MRSA Next Gen by PCR, Nasal     Status: None   Collection Time: 08/16/22 11:22 PM   Specimen: Nasal Mucosa; Nasal Swab  Result Value Ref Range Status   MRSA by PCR Next Gen NOT DETECTED NOT DETECTED Final    Comment: (NOTE) The GeneXpert MRSA Assay (FDA approved for NASAL specimens only), is one component of a comprehensive MRSA colonization surveillance program. It is not intended to diagnose MRSA infection nor to guide or monitor treatment for MRSA infections. Test performance is not FDA approved in patients less than 45 years old. Performed at [facility], [address] [place], [place] [address]   Respiratory (~20 pathogens) panel by PCR     Status: None   Collection Time: 08/17/22 12:55 AM   Specimen: Nasopharyngeal Swab; Respiratory  Result Value Ref Range Status   Adenovirus NOT DETECTED NOT DETECTED Final   Coronavirus 229E NOT DETECTED NOT DETECTED Final    Comment: (NOTE) The Coronavirus on the Respiratory Panel, DOES NOT test for the novel  Coronavirus (2019 nCoV)    Coronavirus HKU1 NOT DETECTED NOT DETECTED Final   Coronavirus NL63 NOT DETECTED NOT DETECTED Final   Coronavirus OC43 NOT DETECTED NOT DETECTED Final   Metapneumovirus NOT DETECTED NOT DETECTED Final   Rhinovirus /  Enterovirus NOT DETECTED NOT DETECTED Final   Influenza A NOT DETECTED NOT DETECTED Final   Influenza B NOT DETECTED NOT DETECTED Final   Parainfluenza Virus 1 NOT DETECTED NOT DETECTED Final   Parainfluenza Virus 2 NOT DETECTED NOT DETECTED Final   Parainfluenza Virus 3 NOT DETECTED NOT DETECTED Final   Parainfluenza Virus 4 NOT DETECTED NOT DETECTED Final   Respiratory Syncytial Virus NOT DETECTED NOT DETECTED Final   Bordetella pertussis NOT DETECTED NOT DETECTED Final   Bordetella Parapertussis NOT DETECTED NOT DETECTED Final   Chlamydophila pneumoniae NOT DETECTED NOT DETECTED Final   Mycoplasma pneumoniae NOT DETECTED NOT DETECTED Final    Comment: Performed at [facility], [address], [place], [place] [address]     Time coordinating discharge: Over 30 minutes  SIGNED:   Charise Killian, MD  [facility] 08/24/2022, 12:13 PM Pager   If 7PM-7AM, please contact night-coverage [email or web address]

## 2022-12-30 ENCOUNTER — Inpatient Hospital Stay (HOSPITAL_COMMUNITY)
Admission: EM | Admit: 2022-12-30 | Discharge: 2023-01-07 | DRG: 189 | Disposition: A | Discharge status: Home / Self Care | Payer: Medicaid Other | Attending: Family Medicine | Admitting: Family Medicine

## 2022-12-30 ENCOUNTER — Emergency Department (HOSPITAL_COMMUNITY): Payer: Medicaid Other

## 2022-12-30 ENCOUNTER — Other Ambulatory Visit: Payer: Self-pay

## 2022-12-30 ENCOUNTER — Encounter (HOSPITAL_COMMUNITY): Payer: Self-pay

## 2022-12-30 DIAGNOSIS — E063 Autoimmune thyroiditis: Secondary | ICD-10-CM | POA: Diagnosis present

## 2022-12-30 DIAGNOSIS — E8729 Other acidosis: Secondary | ICD-10-CM | POA: Diagnosis present

## 2022-12-30 DIAGNOSIS — Z1152 Encounter for screening for COVID-19: Secondary | ICD-10-CM | POA: Diagnosis not present

## 2022-12-30 DIAGNOSIS — I480 Paroxysmal atrial fibrillation: Secondary | ICD-10-CM | POA: Diagnosis present

## 2022-12-30 DIAGNOSIS — J9601 Acute respiratory failure with hypoxia: Secondary | ICD-10-CM | POA: Diagnosis not present

## 2022-12-30 DIAGNOSIS — J9612 Chronic respiratory failure with hypercapnia: Secondary | ICD-10-CM

## 2022-12-30 DIAGNOSIS — Z90710 Acquired absence of both cervix and uterus: Secondary | ICD-10-CM | POA: Diagnosis not present

## 2022-12-30 DIAGNOSIS — E66811 Obesity, class 1: Secondary | ICD-10-CM | POA: Diagnosis present

## 2022-12-30 DIAGNOSIS — Z7951 Long term (current) use of inhaled steroids: Secondary | ICD-10-CM

## 2022-12-30 DIAGNOSIS — F32A Depression, unspecified: Secondary | ICD-10-CM | POA: Diagnosis present

## 2022-12-30 DIAGNOSIS — I5032 Chronic diastolic (congestive) heart failure: Secondary | ICD-10-CM | POA: Diagnosis present

## 2022-12-30 DIAGNOSIS — Z79899 Other long term (current) drug therapy: Secondary | ICD-10-CM

## 2022-12-30 DIAGNOSIS — Z91018 Allergy to other foods: Secondary | ICD-10-CM

## 2022-12-30 DIAGNOSIS — F411 Generalized anxiety disorder: Secondary | ICD-10-CM | POA: Diagnosis present

## 2022-12-30 DIAGNOSIS — I10 Essential (primary) hypertension: Secondary | ICD-10-CM | POA: Diagnosis present

## 2022-12-30 DIAGNOSIS — Z6832 Body mass index (BMI) 32.0-32.9, adult: Secondary | ICD-10-CM | POA: Diagnosis not present

## 2022-12-30 DIAGNOSIS — D696 Thrombocytopenia, unspecified: Secondary | ICD-10-CM | POA: Diagnosis present

## 2022-12-30 DIAGNOSIS — J9622 Acute and chronic respiratory failure with hypercapnia: Secondary | ICD-10-CM | POA: Diagnosis present

## 2022-12-30 DIAGNOSIS — Z888 Allergy status to other drugs, medicaments and biological substances status: Secondary | ICD-10-CM

## 2022-12-30 DIAGNOSIS — E785 Hyperlipidemia, unspecified: Secondary | ICD-10-CM | POA: Diagnosis present

## 2022-12-30 DIAGNOSIS — F418 Other specified anxiety disorders: Secondary | ICD-10-CM | POA: Diagnosis present

## 2022-12-30 DIAGNOSIS — Z7901 Long term (current) use of anticoagulants: Secondary | ICD-10-CM

## 2022-12-30 DIAGNOSIS — J441 Chronic obstructive pulmonary disease with (acute) exacerbation: Secondary | ICD-10-CM | POA: Diagnosis present

## 2022-12-30 DIAGNOSIS — K219 Gastro-esophageal reflux disease without esophagitis: Secondary | ICD-10-CM | POA: Diagnosis present

## 2022-12-30 DIAGNOSIS — I110 Hypertensive heart disease with heart failure: Secondary | ICD-10-CM | POA: Diagnosis present

## 2022-12-30 DIAGNOSIS — Z66 Do not resuscitate: Secondary | ICD-10-CM | POA: Diagnosis present

## 2022-12-30 DIAGNOSIS — R739 Hyperglycemia, unspecified: Secondary | ICD-10-CM | POA: Diagnosis present

## 2022-12-30 DIAGNOSIS — Z79890 Hormone replacement therapy (postmenopausal): Secondary | ICD-10-CM

## 2022-12-30 DIAGNOSIS — T380X5A Adverse effect of glucocorticoids and synthetic analogues, initial encounter: Secondary | ICD-10-CM | POA: Diagnosis present

## 2022-12-30 DIAGNOSIS — J9621 Acute and chronic respiratory failure with hypoxia: Principal | ICD-10-CM | POA: Diagnosis present

## 2022-12-30 DIAGNOSIS — F17210 Nicotine dependence, cigarettes, uncomplicated: Secondary | ICD-10-CM | POA: Diagnosis present

## 2022-12-30 LAB — BASIC METABOLIC PANEL
Anion gap: 11 (ref 5–15)
BUN: 14 mg/dL (ref 8–23)
CO2: 37 mmol/L — ABNORMAL HIGH (ref 22–32)
Calcium: 9.6 mg/dL (ref 8.9–10.3)
Chloride: 93 mmol/L — ABNORMAL LOW (ref 98–111)
Creatinine, Ser: 0.58 mg/dL (ref 0.44–1.00)
GFR, Estimated: >60 mL/min (ref >60)
Glucose, Bld: 134 mg/dL — ABNORMAL HIGH (ref 70–99)
Potassium: 3.7 mmol/L (ref 3.5–5.1)
Sodium: 141 mmol/L (ref 135–145)

## 2022-12-30 LAB — BLOOD GAS, ARTERIAL
Acid-Base Excess: 15.6 mmol/L — ABNORMAL HIGH (ref 0.0–2.0)
Acid-Base Excess: 16 mmol/L — ABNORMAL HIGH (ref 0.0–2.0)
Bicarbonate: 43.5 mmol/L — ABNORMAL HIGH (ref 20.0–28.0)
Bicarbonate: 43.8 mmol/L — ABNORMAL HIGH (ref 20.0–28.0)
Drawn by: 22223
Drawn by: 22179
FIO2: 36 %
O2 Saturation: 90.1 %
O2 Saturation: 98.4 %
Patient temperature: 37
Patient temperature: 37
pCO2 arterial: 67 mm[Hg] (ref 32–48)
pCO2 arterial: 66 mm[Hg] (ref 32–48)
pH, Arterial: 7.42 (ref 7.35–7.45)
pH, Arterial: 7.43 (ref 7.35–7.45)
pO2, Arterial: 49 mm[Hg] — ABNORMAL LOW (ref 83–108)
pO2, Arterial: 78 mm[Hg] — ABNORMAL LOW (ref 83–108)

## 2022-12-30 LAB — CBC WITH DIFFERENTIAL/PLATELET
Abs Immature Granulocytes: 0.02 10*3/uL (ref 0.00–0.07)
Basophils Absolute: 0 10*3/uL (ref 0.0–0.1)
Basophils Relative: 0 %
Eosinophils Absolute: 0 10*3/uL (ref 0.0–0.5)
Eosinophils Relative: 0 %
HCT: 44.9 % (ref 36.0–46.0)
Hemoglobin: 12.5 g/dL (ref 12.0–15.0)
Immature Granulocytes: 0 %
Lymphocytes Relative: 8 %
Lymphs Abs: 0.7 10*3/uL (ref 0.7–4.0)
MCH: 30.7 pg (ref 26.0–34.0)
MCHC: 27.8 g/dL — ABNORMAL LOW (ref 30.0–36.0)
MCV: 110.3 fL — ABNORMAL HIGH (ref 80.0–100.0)
Monocytes Absolute: 0.4 10*3/uL (ref 0.1–1.0)
Monocytes Relative: 4 %
Neutro Abs: 7.7 10*3/uL (ref 1.7–7.7)
Neutrophils Relative %: 88 %
Platelets: 101 10*3/uL — ABNORMAL LOW (ref 150–400)
RBC: 4.07 MIL/uL (ref 3.87–5.11)
RDW: 13.2 % (ref 11.5–15.5)
WBC: 8.8 10*3/uL (ref 4.0–10.5)
nRBC: 0 % (ref 0.0–0.2)

## 2022-12-30 LAB — BRAIN NATRIURETIC PEPTIDE: B Natriuretic Peptide: 35 pg/mL (ref 0.0–100.0)

## 2022-12-30 LAB — GLUCOSE, CAPILLARY
Glucose-Capillary: 108 mg/dL — ABNORMAL HIGH (ref 70–99)
Glucose-Capillary: 125 mg/dL — ABNORMAL HIGH (ref 70–99)
Glucose-Capillary: 134 mg/dL — ABNORMAL HIGH (ref 70–99)
Glucose-Capillary: 153 mg/dL — ABNORMAL HIGH (ref 70–99)

## 2022-12-30 LAB — TROPONIN I (HIGH SENSITIVITY): Troponin I (High Sensitivity): 8 ng/L (ref <18)

## 2022-12-30 LAB — MRSA NEXT GEN BY PCR, NASAL: MRSA by PCR Next Gen: NOT DETECTED

## 2022-12-30 LAB — SARS CORONAVIRUS 2 BY RT PCR: SARS Coronavirus 2 by RT PCR: NEGATIVE

## 2022-12-30 MED ORDER — METHYLPREDNISOLONE SODIUM SUCC 125 MG IJ SOLR
125.0000 mg | Freq: Once | INTRAMUSCULAR | Status: AC
  Administered 2022-12-30: 125 mg via INTRAVENOUS
  Filled 2022-12-30: qty 2

## 2022-12-30 MED ORDER — ONDANSETRON HCL 4 MG PO TABS: 4.0000 mg | ORAL_TABLET | Freq: Four times a day (QID) | ORAL | Status: DC | PRN

## 2022-12-30 MED ORDER — ACETAMINOPHEN 325 MG PO TABS
650.0000 mg | ORAL_TABLET | Freq: Four times a day (QID) | ORAL | Status: DC | PRN
  Administered 2022-12-30 – 2023-01-05 (×2): 650 mg via ORAL
  Filled 2022-12-30 (×2): qty 2

## 2022-12-30 MED ORDER — ESCITALOPRAM OXALATE 10 MG PO TABS
10.0000 mg | ORAL_TABLET | Freq: Every day | ORAL | Status: DC
  Administered 2022-12-31 – 2023-01-07 (×8): 10 mg via ORAL
  Filled 2022-12-30 (×8): qty 1

## 2022-12-30 MED ORDER — IPRATROPIUM-ALBUTEROL 0.5-2.5 (3) MG/3ML IN SOLN
3.0000 mL | Freq: Once | RESPIRATORY_TRACT | Status: AC
  Administered 2022-12-30: 3 mL via RESPIRATORY_TRACT

## 2022-12-30 MED ORDER — LEVOTHYROXINE SODIUM 100 MCG PO TABS
200.0000 ug | ORAL_TABLET | Freq: Every day | ORAL | Status: DC
  Administered 2023-01-01 – 2023-01-07 (×7): 200 ug via ORAL
  Filled 2022-12-30 (×8): qty 2

## 2022-12-30 MED ORDER — LORAZEPAM 2 MG/ML IJ SOLN
1.0000 mg | Freq: Four times a day (QID) | INTRAMUSCULAR | Status: DC | PRN
  Administered 2022-12-30: 1 mg via INTRAVENOUS
  Filled 2022-12-30: qty 1

## 2022-12-30 MED ORDER — DOXYCYCLINE HYCLATE 100 MG IV SOLR
100.0000 mg | Freq: Two times a day (BID) | INTRAVENOUS | Status: AC
  Administered 2022-12-30 – 2022-12-31 (×3): 100 mg via INTRAVENOUS
  Filled 2022-12-30 (×5): qty 100

## 2022-12-30 MED ORDER — SUMATRIPTAN SUCCINATE 50 MG PO TABS: 50.0000 mg | ORAL_TABLET | ORAL | Status: DC | PRN

## 2022-12-30 MED ORDER — METHYLPREDNISOLONE SODIUM SUCC 125 MG IJ SOLR
80.0000 mg | Freq: Two times a day (BID) | INTRAMUSCULAR | Status: DC
  Administered 2022-12-30 – 2022-12-31 (×2): 80 mg via INTRAVENOUS
  Filled 2022-12-30 (×2): qty 2

## 2022-12-30 MED ORDER — BETHANECHOL CHLORIDE 10 MG PO TABS
5.0000 mg | ORAL_TABLET | Freq: Two times a day (BID) | ORAL | Status: DC
Start: 2022-12-30 — End: 2023-01-07
  Administered 2022-12-30 – 2023-01-07 (×15): 5 mg via ORAL
  Filled 2022-12-30 (×10): qty 0.5
  Filled 2022-12-30: qty 1
  Filled 2022-12-30 (×3): qty 0.5
  Filled 2022-12-30: qty 1
  Filled 2022-12-30 (×6): qty 0.5

## 2022-12-30 MED ORDER — RIVAROXABAN 20 MG PO TABS: 20.0000 mg | ORAL_TABLET | Freq: Every day | ORAL | Status: DC

## 2022-12-30 MED ORDER — ATORVASTATIN CALCIUM 20 MG PO TABS
20.0000 mg | ORAL_TABLET | Freq: Every day | ORAL | Status: DC
  Administered 2022-12-30 – 2023-01-06 (×8): 20 mg via ORAL
  Filled 2022-12-30 (×2): qty 1
  Filled 2022-12-30: qty 2
  Filled 2022-12-30 (×5): qty 1

## 2022-12-30 MED ORDER — ONDANSETRON HCL 4 MG/2ML IJ SOLN: 4.0000 mg | Freq: Four times a day (QID) | INTRAMUSCULAR | Status: DC | PRN

## 2022-12-30 MED ORDER — FOLIC ACID 1 MG PO TABS
1.0000 mg | ORAL_TABLET | Freq: Every day | ORAL | Status: DC
  Administered 2022-12-31 – 2023-01-07 (×8): 1 mg via ORAL
  Filled 2022-12-30 (×8): qty 1

## 2022-12-30 MED ORDER — BUDESONIDE 0.25 MG/2ML IN SUSP
0.2500 mg | Freq: Two times a day (BID) | RESPIRATORY_TRACT | Status: DC
  Administered 2022-12-30 – 2023-01-04 (×10): 0.25 mg via RESPIRATORY_TRACT
  Filled 2022-12-30 (×10): qty 2

## 2022-12-30 MED ORDER — CYCLOBENZAPRINE HCL 10 MG PO TABS: 5.0000 mg | ORAL_TABLET | Freq: Three times a day (TID) | ORAL | Status: DC | PRN

## 2022-12-30 MED ORDER — CITALOPRAM HYDROBROMIDE 20 MG PO TABS: 20.0000 mg | ORAL_TABLET | Freq: Every day | ORAL | Status: DC

## 2022-12-30 MED ORDER — METOPROLOL TARTRATE 5 MG/5ML IV SOLN
5.0000 mg | Freq: Four times a day (QID) | INTRAVENOUS | Status: DC
  Administered 2022-12-30 – 2023-01-02 (×12): 5 mg via INTRAVENOUS
  Filled 2022-12-30 (×12): qty 5

## 2022-12-30 MED ORDER — METOPROLOL TARTRATE 25 MG PO TABS
12.5000 mg | ORAL_TABLET | Freq: Two times a day (BID) | ORAL | Status: DC
  Administered 2022-12-31 – 2023-01-07 (×15): 12.5 mg via ORAL
  Filled 2022-12-30 (×15): qty 1

## 2022-12-30 MED ORDER — RIVAROXABAN 20 MG PO TABS
20.0000 mg | ORAL_TABLET | Freq: Every day | ORAL | Status: DC
  Administered 2022-12-30 – 2023-01-06 (×8): 20 mg via ORAL
  Filled 2022-12-30 (×8): qty 1

## 2022-12-30 MED ORDER — LORATADINE 10 MG PO TABS
10.0000 mg | ORAL_TABLET | Freq: Every day | ORAL | Status: DC
  Administered 2022-12-31 – 2023-01-07 (×8): 10 mg via ORAL
  Filled 2022-12-30 (×8): qty 1

## 2022-12-30 MED ORDER — CHLORHEXIDINE GLUCONATE CLOTH 2 % EX PADS
6.0000 | MEDICATED_PAD | Freq: Every day | CUTANEOUS | Status: DC
  Administered 2022-12-30 – 2023-01-07 (×8): 6 via TOPICAL

## 2022-12-30 MED ORDER — ALBUTEROL SULFATE (2.5 MG/3ML) 0.083% IN NEBU
10.0000 mg | INHALATION_SOLUTION | RESPIRATORY_TRACT | Status: AC
  Administered 2022-12-30: 10 mg via RESPIRATORY_TRACT
  Filled 2022-12-30: qty 12

## 2022-12-30 MED ORDER — PANTOPRAZOLE SODIUM 40 MG PO TBEC
40.0000 mg | DELAYED_RELEASE_TABLET | Freq: Every day | ORAL | Status: DC
  Administered 2022-12-31 – 2023-01-07 (×8): 40 mg via ORAL
  Filled 2022-12-30 (×8): qty 1

## 2022-12-30 MED ORDER — TOPIRAMATE 100 MG PO TABS
100.0000 mg | ORAL_TABLET | Freq: Two times a day (BID) | ORAL | Status: DC
  Administered 2022-12-30 – 2023-01-07 (×16): 100 mg via ORAL
  Filled 2022-12-30 (×2): qty 1
  Filled 2022-12-30: qty 4
  Filled 2022-12-30 (×5): qty 1
  Filled 2022-12-30: qty 4
  Filled 2022-12-30 (×7): qty 1

## 2022-12-30 MED ORDER — LEVALBUTEROL HCL 0.63 MG/3ML IN NEBU
0.6300 mg | INHALATION_SOLUTION | Freq: Four times a day (QID) | RESPIRATORY_TRACT | Status: DC
  Administered 2022-12-30 – 2023-01-07 (×33): 0.63 mg via RESPIRATORY_TRACT
  Filled 2022-12-30 (×34): qty 3

## 2022-12-30 MED ORDER — INSULIN ASPART 100 UNIT/ML IJ SOLN
0.0000 [IU] | INTRAMUSCULAR | Status: DC
Start: 2022-12-30 — End: 2023-01-06
  Administered 2022-12-30 – 2022-12-31 (×4): 1 [IU] via SUBCUTANEOUS
  Administered 2023-01-01: 2 [IU] via SUBCUTANEOUS
  Administered 2023-01-01 – 2023-01-05 (×3): 1 [IU] via SUBCUTANEOUS

## 2022-12-30 MED ORDER — ACETAMINOPHEN 650 MG RE SUPP: 650.0000 mg | Freq: Four times a day (QID) | RECTAL | Status: DC | PRN

## 2022-12-30 NOTE — Progress Notes (Signed)
°   12/30/22 2212  Charting Type  Charting Type Full Reassessment Changes Noted  Gastrointestinal  Gastrointestinal (WDL) X  Tenderness Tender   Pt is hypotensive. Manual pressure obtained that reads 110/50. Automatic cuff replaced and 112/54 obtained. She is maintaining SPO2 of 100% currently on 3 L [place]. Pt also c/o abdominal pain that radiates to back. She then drifts off to sleep. Does not appear to be in acute distress at this time. Cardiac monitor alarming ST elevation. Prior EKG this AM mentioned ST elevation as well. Dr Thomes Dinning was made aware and orders EKG. EKG demonstrates Normal Sinus Rhythm. Pt repositioned self in bed, warm blanket provided, and patient goes back to sleep. Kellogg RN

## 2022-12-30 NOTE — H&P (Addendum)
History and Physical    Terri Casey OZH:[id number] DOB: [birth year] DOA: 12/30/2022  PCP: Karl Bales, NP   Patient coming from: Home  Chief Complaint: Dyspnea  HPI: Terri Casey is a 62 y.o. female with medical history significant for COPD with chronic hypoxemia 3 L nasal cannula, PAF on Xarelto, chronic diastolic CHF, hypothyroidism, and depression who presented to the ED with worsening shortness of breath over the last 24 hours along with nonproductive cough.  Apparently multiple people at home have been sick with COVID.  She was noted to have oxygen saturation of 66% on room air at home and required 2 nebulizer treatments and route with EMS.  She cannot tell me much further history at this time given her current condition and does not want me to call family members.   ED Course: Vital signs with sinus tachycardia currently noted.  She has been started on BiPAP and given a breathing treatment and IV Solu-Medrol.  ABG with hypercapnia and hypoxemia noted.  Review of Systems: Difficult to obtain given patient condition.  Past Medical History:  Diagnosis Date   Anxiety    Asthma    CHF (congestive heart failure) (HCC)    COPD (chronic obstructive pulmonary disease) (HCC)    Diabetes mellitus without complication (HCC)    Hypertension    Thyroid disease     Past Surgical History:  Procedure Laterality Date   ABDOMINAL HYSTERECTOMY     THORACENTESIS Left 09/18/2021   Procedure: THORACENTESIS;  Surgeon: Lupita Leash, MD;  Location: [facility] ENDOSCOPY;  Service: Cardiopulmonary;  Laterality: Left;     reports that she has been smoking cigarettes. She has a 10 pack-year smoking history. She has never used smokeless tobacco. She reports that she does not drink alcohol and does not use drugs.  Allergies  Allergen Reactions   Estrogens Hives   Mustard Hives   Strawberry Extract Hives    History reviewed. No pertinent family history.  Prior to Admission medications    Medication Sig Start Date End Date Taking? Authorizing Provider  albuterol (PROAIR HFA) 108 (90 Base) MCG/ACT inhaler 2 puffs every 4 hours as needed only  if your can't catch your breath 03/21/21   Laural Benes, Clanford L, MD  albuterol (PROVENTIL) (2.5 MG/3ML) 0.083% nebulizer solution Take 3 mLs (2.5 mg total) by nebulization every 6 (six) hours as needed for wheezing or shortness of breath. 03/21/21   Johnson, Clanford L, MD  atorvastatin (LIPITOR) 20 MG tablet Take 20 mg by mouth at bedtime.     [provider]  bethanechol (URECHOLINE) 5 MG tablet Take 5 mg by mouth 2 (two) times daily. 08/06/19   [provider]  citalopram (CELEXA) 20 MG tablet Take 20 mg by mouth daily.    [provider]  cyclobenzaprine (FLEXERIL) 5 MG tablet Take 5 mg by mouth 3 (three) times daily as needed for muscle spasms. 04/20/22   [provider]  escitalopram (LEXAPRO) 10 MG tablet Take 10 mg by mouth daily. 08/04/22   [provider]  fluticasone-salmeterol (ADVAIR) 500-50 MCG/ACT AEPB Inhale 1 puff into the lungs in the morning and at bedtime.    [provider]  folic acid (FOLVITE) 1 MG tablet Take 1 tablet (1 mg total) by mouth daily. 10/11/19   Catarina Hartshorn, MD  levothyroxine (SYNTHROID) 200 MCG tablet Take 1 tablet (200 mcg total) by mouth daily at 6 (six) AM. 06/20/19   Swayze, Ava, DO  loratadine (CLARITIN) 10 MG tablet  Take 10 mg by mouth daily. 08/04/22   [provider]  metoprolol tartrate (LOPRESSOR) 25 MG tablet Take 12.5 mg by mouth 2 (two) times daily. 08/04/22   [provider]  pantoprazole (PROTONIX) 40 MG tablet Take 1 tablet (40 mg total) by mouth daily. 06/20/19   Swayze, Ava, DO  predniSONE (DELTASONE) 10 MG tablet 20mg  daily x 3 days, then 10mg  daily x 3 days then stop 08/24/22   Charise Killian, MD  SUMAtriptan (IMITREX) 50 MG tablet Take 50 mg by mouth every 2 (two) hours as needed for migraine. 07/31/21   [provider]   topiramate (TOPAMAX) 100 MG tablet Take 100 mg by mouth 2 (two) times daily. 06/01/20   [provider]  Vitamin D, Ergocalciferol, (DRISDOL) 1.25 MG (50000 UNIT) CAPS capsule Take 50,000 Units by mouth once a week. 10/02/21   [provider]  XARELTO 20 MG TABS tablet Take 20 mg by mouth daily. 05/08/19   [provider]    Physical Exam: Vitals:   12/30/22 0945 12/30/22 1000 12/30/22 1015 12/30/22 1030  BP: 112/62 105/71 112/63 (!) 126/101  Pulse: (!) 129 (!) 126 (!) 128 (!) 128  Resp: (!) 25 (!) 25 (!) 28 (!) 27  Temp:      TempSrc:      SpO2: 92% 95% 93% 94%  Weight:      Height:        Constitutional: NAD, calm, comfortable Vitals:   12/30/22 0945 12/30/22 1000 12/30/22 1015 12/30/22 1030  BP: 112/62 105/71 112/63 (!) 126/101  Pulse: (!) 129 (!) 126 (!) 128 (!) 128  Resp: (!) 25 (!) 25 (!) 28 (!) 27  Temp:      TempSrc:      SpO2: 92% 95% 93% 94%  Weight:      Height:       Eyes: lids and conjunctivae normal Neck: normal, supple Respiratory: Diminished to auscultation bilaterally started on BiPAP FiO2 40%. Cardiovascular: Regular rate and rhythm, no murmurs.  Tachycardic. Abdomen: no tenderness, no distention. Bowel sounds positive.  Musculoskeletal:  No edema. Skin: no rashes, lesions, ulcers.  Psychiatric: Flat affect  Labs on Admission: I have personally reviewed following labs and imaging studies  CBC: Recent Labs  Lab 12/30/22 0555  WBC 8.8  NEUTROABS 7.7  HGB 12.5  HCT 44.9  MCV 110.3*  PLT 101*   Basic Metabolic Panel: Recent Labs  Lab 12/30/22 0555  NA 141  K 3.7  CL 93*  CO2 37*  GLUCOSE 134*  BUN 14  CREATININE 0.58  CALCIUM 9.6   GFR: Estimated Creatinine Clearance: 88.9 mL/min (by C-G formula based on SCr of 0.58 mg/dL). Liver Function Tests: No results for input(s): "AST", "ALT", "ALKPHOS", "BILITOT", "PROT", "ALBUMIN" in the last 168 hours. No results for input(s): "LIPASE", "AMYLASE" in the last 168  hours. No results for input(s): "AMMONIA" in the last 168 hours. Coagulation Profile: No results for input(s): "INR", "PROTIME" in the last 168 hours. Cardiac Enzymes: No results for input(s): "CKTOTAL", "CKMB", "CKMBINDEX", "TROPONINI" in the last 168 hours. BNP (last 3 results) No results for input(s): "PROBNP" in the last 8760 hours. HbA1C: No results for input(s): "HGBA1C" in the last 72 hours. CBG: No results for input(s): "GLUCAP" in the last 168 hours. Lipid Profile: No results for input(s): "CHOL", "HDL", "LDLCALC", "TRIG", "CHOLHDL", "LDLDIRECT" in the last 72 hours. Thyroid Function Tests: No results for input(s): "TSH", "T4TOTAL", "FREET4", "T3FREE", "THYROIDAB" in the last 72 hours. Anemia  Panel: No results for input(s): "VITAMINB12", "FOLATE", "FERRITIN", "TIBC", "IRON", "RETICCTPCT" in the last 72 hours. Urine analysis:    Component Value Date/Time   COLORURINE AMBER (A) 08/16/2022 2237   APPEARANCEUR CLOUDY (A) 08/16/2022 2237   LABSPEC 1.018 08/16/2022 2237   PHURINE 5.0 08/16/2022 2237   GLUCOSEU NEGATIVE 08/16/2022 2237   HGBUR SMALL (A) 08/16/2022 2237   BILIRUBINUR NEGATIVE 08/16/2022 2237   KETONESUR NEGATIVE 08/16/2022 2237   PROTEINUR 100 (A) 08/16/2022 2237   NITRITE NEGATIVE 08/16/2022 2237   LEUKOCYTESUR NEGATIVE 08/16/2022 2237    Radiological Exams on Admission: DG Chest Port 1 View Result Date: 12/30/2022 CLINICAL DATA:  62 year old female with sick contacts at home, found hypoxic on 2 liters nasal cannula home oxygen. Shortness of breath. EXAM: PORTABLE CHEST 1 VIEW COMPARISON:  Portable chest 08/18/2022 and earlier. FINDINGS: Portable AP semi upright view at 0726 hours. Centrilobular emphysema confirmed on CT last year. Stable large lung volumes. Mediastinal contours are stable and within normal limits. Visualized tracheal air column is within normal limits. No pneumothorax. No pleural effusion or consolidation. No pulmonary edema. And no convincing  acute pulmonary opacity. No acute osseous abnormality identified. Paucity of bowel gas in the upper abdomen. IMPRESSION: Emphysema ([id number]) with no acute cardiopulmonary abnormality identified. Electronically Signed   By: Odessa Fleming M.D.   On: 12/30/2022 07:35    EKG: Independently reviewed. ST 129bpm.  Assessment/Plan Principal Problem:   Acute hypoxemic respiratory failure (HCC) Active Problems:   COPD exacerbation (HCC)   AF (paroxysmal atrial fibrillation) (HCC)   Essential hypertension, benign   Acute on chronic respiratory failure with hypoxia and hypercapnia (HCC)   Thrombocytopenia (HCC)   Hashimoto's thyroiditis   Generalized anxiety disorder   Depression    Acute on chronic hypoxemic/hypercapnic respiratory failure secondary to acute COPD exacerbation -Needs BiPAP -No sign of infiltrate on chest x-ray, respiratory panel and COVID testing ordered and pending -IV Solu-Medrol 80 mg twice daily -IV doxycycline -Pulmicort twice daily -Xopenex scheduled every 6 hours -Continue to follow ABG  Paroxysmal atrial fibrillation -Currently with sinus tachycardia on EKG -Continue Xarelto and monitor on telemetry -Metoprolol for heart rate control  Hyperglycemia -Likely due to steroid use, no history of type 2 diabetes  Chronic diastolic CHF -Noted to have chronic left pleural effusion previously, but chest x-ray not demonstrating this -Currently appears euvolemic  Hypothyroidism -Continue levothyroxine  Depression -Continue Lexapro  Thrombocytopenia -Continue to monitor  Obesity class I -BMI 32.23   DVT prophylaxis: Xarelto Code Status: DNR/DNI Family Communication: None at bedside Disposition Plan:Admit for COPD exacerbation Consults called:None Admission status: Inpatient, SDU  Severity of Illness: The appropriate patient status for this patient is INPATIENT. Inpatient status is judged to be reasonable and necessary in order to provide the required  intensity of service to ensure the patient's safety. The patient's presenting symptoms, physical exam findings, and initial radiographic and laboratory data in the context of their chronic comorbidities is felt to place them at high risk for further clinical deterioration. Furthermore, it is not anticipated that the patient will be medically stable for discharge from the hospital within 2 midnights of admission.   * I certify that at the point of admission it is my clinical judgment that the patient will require inpatient hospital care spanning beyond 2 midnights from the point of admission due to high intensity of service, high risk for further deterioration and high frequency of surveillance required.*   Terri Giarrusso D Sherryll Burger DO [facility]  If  7PM-7AM, please contact night-coverage [email or web address]  12/30/2022, 10:43 AM

## 2022-12-30 NOTE — ED Notes (Signed)
MD notified of oxygen increase.

## 2022-12-30 NOTE — ED Triage Notes (Signed)
Pt arrived via [facility] from home c/o SOB. Per EMS, Pt was found on 2L Nasal Cannula at 66%. EMS administered 2 DuoNeb treatments PTA and increased O2 to 6L and saw Pts O2 Sats improve to 97%. Pt reports everyone in her home is sick. Pt reports Hx of CHF and COPD.

## 2022-12-30 NOTE — ED Provider Notes (Signed)
At the change of shift care was signed out from Dr. Dione Booze to follow-up the patient's improvement on bronchodilator therapy.  This patient is a 62 year old female who is in respiratory distress moving very little air, she has a heart rate in the 120s, her blood pressure is soft but measuring 105/70, she is afebrile but has a respiratory rate around 28 and an oxygen saturation that dropped into the 70s off of oxygen.  She is not oxygen dependent at home, she does have a history of recurrent admissions to the hospital for respiratory failure, she continues to smoke cigarettes.  On my exam the patient is moving minimal air, she is requiring multiple bronchodilator therapies including continuous nebulizers, her ABG suggest that she has an elevated CO2 however it appears compensated she does not appear acidotic.  Chest x-ray does not reveal any signs of pneumonia or infiltrate or pneumothorax.  The patient has been given Solu-Medrol, she has been given a DuoNeb and is now getting a continuous nebulizer.  Given the decreased air movement and increased work of breathing she will need to be admitted to the hospital, she does not need to be intubated at this time, I will discuss her care with hospitalist for admission to the hospital.  CRITICAL CARE Performed by: Vida Roller Total critical care time: 35 minutes Critical care time was exclusive of separately billable procedures and treating other patients. Critical care was necessary to treat or prevent imminent or life-threatening deterioration. Critical care was time spent personally by me on the following activities: development of treatment plan with patient and/or surrogate as well as nursing, discussions with consultants, evaluation of patient's response to treatment, examination of patient, obtaining history from patient or surrogate, ordering and performing treatments and interventions, ordering and review of laboratory studies, ordering and review of  radiographic studies, pulse oximetry and re-evaluation of patient's condition.  Final diagnoses:  COPD exacerbation (HCC)  Chronic respiratory acidosis (HCC)  Thrombocytopenia (HCC)      Eber Hong, MD 12/30/22 [phone]

## 2022-12-30 NOTE — ED Notes (Signed)
This nurse talked to pt's DIL and DIL informs pt is on 3 liters at home.

## 2022-12-30 NOTE — ED Notes (Signed)
Pt's sats dropping into low 80's. Respiratory notified and states pt on 10 liters at home iand instructions given to this nurse to increase O2 to 8 l/min via n/c.

## 2022-12-30 NOTE — Plan of Care (Signed)

## 2022-12-30 NOTE — Progress Notes (Signed)
Approximately 1135 patient transported from ER to ICU #8 on the BiPAP without issue.BiPap reconnected and checked.

## 2022-12-30 NOTE — ED Notes (Signed)
ED Provider at bedside. 

## 2022-12-30 NOTE — ED Provider Notes (Signed)
[place] EMERGENCY DEPARTMENT AT [facility] Provider Note   CSN: [id number] Arrival date & time: 12/30/22  0550     History  Chief Complaint  Patient presents with   Shortness of Breath    BIBI SISE is a 62 y.o. female.  The history is provided by the patient.  She has history of hypertension, hyperlipidemia, COPD, diastolic heart failure, Hashimoto's thyroiditis and comes in complaining of increased shortness of breath over the last 24 hours.  She has had a nonproductive cough.  She has been using her home nebulizer with slight relief.  She states multiple people in the house have been sick with COVID.  EMS reported initial oxygen saturation of 66% on room air while on oxygen 2 L/min by nasal cannula.  Following 2 nebulizer treatments with albuterol and ipratropium, patient improved significantly.  Oxygen saturation improved with increasing nasal oxygen.   Home Medications Prior to Admission medications   Medication Sig Start Date End Date Taking? Authorizing Provider  albuterol (PROAIR HFA) 108 (90 Base) MCG/ACT inhaler 2 puffs every 4 hours as needed only  if your can't catch your breath 03/21/21   Laural Benes, Clanford L, MD  albuterol (PROVENTIL) (2.5 MG/3ML) 0.083% nebulizer solution Take 3 mLs (2.5 mg total) by nebulization every 6 (six) hours as needed for wheezing or shortness of breath. 03/21/21   Johnson, Clanford L, MD  atorvastatin (LIPITOR) 20 MG tablet Take 20 mg by mouth at bedtime.     [provider]  bethanechol (URECHOLINE) 5 MG tablet Take 5 mg by mouth 2 (two) times daily. 08/06/19   [provider]  citalopram (CELEXA) 20 MG tablet Take 20 mg by mouth daily.    [provider]  cyclobenzaprine (FLEXERIL) 5 MG tablet Take 5 mg by mouth 3 (three) times daily as needed for muscle spasms. 04/20/22   [provider]  escitalopram (LEXAPRO) 10 MG tablet Take 10 mg by mouth daily. 08/04/22   [provider]   fluticasone-salmeterol (ADVAIR) 500-50 MCG/ACT AEPB Inhale 1 puff into the lungs in the morning and at bedtime.    [provider]  folic acid (FOLVITE) 1 MG tablet Take 1 tablet (1 mg total) by mouth daily. 10/11/19   Catarina Hartshorn, MD  levothyroxine (SYNTHROID) 200 MCG tablet Take 1 tablet (200 mcg total) by mouth daily at 6 (six) AM. 06/20/19   Swayze, Ava, DO  loratadine (CLARITIN) 10 MG tablet Take 10 mg by mouth daily. 08/04/22   [provider]  metoprolol tartrate (LOPRESSOR) 25 MG tablet Take 12.5 mg by mouth 2 (two) times daily. 08/04/22   [provider]  pantoprazole (PROTONIX) 40 MG tablet Take 1 tablet (40 mg total) by mouth daily. 06/20/19   Swayze, Ava, DO  predniSONE (DELTASONE) 10 MG tablet 20mg  daily x 3 days, then 10mg  daily x 3 days then stop 08/24/22   Charise Killian, MD  SUMAtriptan (IMITREX) 50 MG tablet Take 50 mg by mouth every 2 (two) hours as needed for migraine. 07/31/21   [provider]  topiramate (TOPAMAX) 100 MG tablet Take 100 mg by mouth 2 (two) times daily. 06/01/20   [provider]  Vitamin D, Ergocalciferol, (DRISDOL) 1.25 MG (50000 UNIT) CAPS capsule Take 50,000 Units by mouth once a week. 10/02/21   [provider]  XARELTO 20 MG TABS tablet Take 20 mg by mouth daily. 05/08/19   [provider]      Allergies    Estrogens,  Mustard, and Strawberry extract    Review of Systems   Review of Systems  All other systems reviewed and are negative.   Physical Exam Updated Vital Signs There were no vitals taken for this visit. Physical Exam Vitals and nursing note reviewed.   63 year old female, resting comfortably and in no acute distress. Vital signs are significant for elevated respiratory rate and heart rate. Oxygen saturation is 89%, which is mildly hypoxic. Head is normocephalic and atraumatic. PERRLA, EOMI. Oropharynx is clear. Neck is nontender and supple without adenopathy or JVD. Lungs are  clear without rales, wheezes, or rhonchi. Chest is nontender. Heart is tachycardic without murmur. Abdomen is soft, flat, nontender. Extremities have no cyanosis or edema. Skin is warm and dry without rash. Neurologic: Mental status is normal, moves all extremities equally.  ED Results / Procedures / Treatments   Labs (all labs ordered are listed, but only abnormal results are displayed) Labs Reviewed  BLOOD GAS, ARTERIAL - Abnormal; Notable for the following components:      Result Value   pCO2 arterial 67 (*)    pO2, Arterial 49 (*)    Bicarbonate 43.5 (*)    Acid-Base Excess 15.6 (*)    All other components within normal limits  BASIC METABOLIC PANEL - Abnormal; Notable for the following components:   Chloride 93 (*)    CO2 37 (*)    Glucose, Bld 134 (*)    All other components within normal limits  CBC WITH DIFFERENTIAL/PLATELET - Abnormal; Notable for the following components:   MCV 110.3 (*)    MCHC 27.8 (*)    Platelets 101 (*)    All other components within normal limits  BRAIN NATRIURETIC PEPTIDE  TROPONIN I (HIGH SENSITIVITY)    EKG EKG Interpretation Date/Time:  Sunday December 30 2022 06:05:46 EST Ventricular Rate:  129 PR Interval:  144 QRS Duration:  91 QT Interval:  316 QTC Calculation: 463 R Axis:   81  Text Interpretation: Sinus tachycardia Consider right atrial enlargement Borderline right axis deviation ST elevation, consider inferior injury When compared with ECG of 08/16/2022, HEART RATE has increased Confirmed by Dione Booze ([id number]) on 12/30/2022 6:14:42 AM  Radiology No results found.  Procedures Procedures  Cardiac monitor shows sinus tachycardia, per my interpretation.  Medications Ordered in ED Medications  albuterol (PROVENTIL,VENTOLIN) solution continuous neb (has no administration in time range)  ipratropium-albuterol (DUONEB) 0.5-2.5 (3) MG/3ML nebulizer solution 3 mL (3 mLs Nebulization Given 12/30/22 0651)  methylPREDNISolone  sodium succinate (SOLU-MEDROL) 125 mg/2 mL injection 125 mg (125 mg Intravenous Given 12/30/22 0707)    ED Course/ Medical Decision Making/ A&P                                 Medical Decision Making Amount and/or Complexity of Data Reviewed Labs: ordered. Radiology: ordered.  Risk Prescription drug management.   COPD exacerbation in the setting of exposure to viral illness.  Consider intercurrent pneumonia, consider heart failure exacerbation.  I have ordered chest x-ray to evaluate for pneumonia and heart failure.  I have ordered an additional nebulizer treatment.  I have ordered laboratory workup of CBC, basic metabolic panel, troponin, BNP.  I have ordered initial dose of methylprednisolone.  I have reviewed her past records and note hospitalization 08/16/2022-08/24/2022 during which time she was intubated for hypercarbic respiratory failure.  Therefore, I am also ordering an ABG to evaluate for possible respiratory acidosis.  I have reviewed her electrocardiogram, my interpretation is sinus tachycardia with borderline right axis deviation.  I have reviewed her laboratory tests, and my interpretation is macrocytosis without anemia, thrombocytopenia which had been present previously, metabolic alkalosis which is new compared with 08/24/2022, arterial blood gas showing elevated pCO2 with normal pH suggesting compensated chronic respiratory acidosis.  Case is signed out to Dr. Hyacinth Meeker.  Final Clinical Impression(s) / ED Diagnoses Final diagnoses:  COPD exacerbation (HCC)  Chronic respiratory acidosis (HCC)  Thrombocytopenia (HCC)    Rx / DC Orders ED Discharge Orders     None         Dione Booze, MD 12/30/22 [phone]

## 2022-12-30 NOTE — ED Notes (Signed)
Date and time results received: 12/30/22 700 (use smartphrase ".now" to insert current time)  Test: abg pco2 Critical Value: 58  Name of Provider Notified: [email or web address]  Orders Received? Or Actions Taken?: n/a

## 2022-12-30 NOTE — ED Notes (Signed)
O2 sats dropped to 79 and O2 increased to 5 liters. Respiratory notified.

## 2022-12-31 DIAGNOSIS — J9601 Acute respiratory failure with hypoxia: Secondary | ICD-10-CM | POA: Diagnosis not present

## 2022-12-31 LAB — BASIC METABOLIC PANEL
Anion gap: 10 (ref 5–15)
BUN: 23 mg/dL (ref 8–23)
CO2: 35 mmol/L — ABNORMAL HIGH (ref 22–32)
Calcium: 9.7 mg/dL (ref 8.9–10.3)
Chloride: 94 mmol/L — ABNORMAL LOW (ref 98–111)
Creatinine, Ser: 0.55 mg/dL (ref 0.44–1.00)
GFR, Estimated: >60 mL/min (ref >60)
Glucose, Bld: 113 mg/dL — ABNORMAL HIGH (ref 70–99)
Potassium: 4.5 mmol/L (ref 3.5–5.1)
Sodium: 139 mmol/L (ref 135–145)

## 2022-12-31 LAB — CBC
HCT: 35.7 % — ABNORMAL LOW (ref 36.0–46.0)
Hemoglobin: 10.2 g/dL — ABNORMAL LOW (ref 12.0–15.0)
MCH: 29.5 pg (ref 26.0–34.0)
MCHC: 28.6 g/dL — ABNORMAL LOW (ref 30.0–36.0)
MCV: 103.2 fL — ABNORMAL HIGH (ref 80.0–100.0)
Platelets: 99 10*3/uL — ABNORMAL LOW (ref 150–400)
RBC: 3.46 MIL/uL — ABNORMAL LOW (ref 3.87–5.11)
RDW: 13.3 % (ref 11.5–15.5)
WBC: 3.7 10*3/uL — ABNORMAL LOW (ref 4.0–10.5)
nRBC: 0 % (ref 0.0–0.2)

## 2022-12-31 LAB — RESPIRATORY PANEL BY PCR

## 2022-12-31 LAB — GLUCOSE, CAPILLARY
Glucose-Capillary: 109 mg/dL — ABNORMAL HIGH (ref 70–99)
Glucose-Capillary: 97 mg/dL (ref 70–99)
Glucose-Capillary: 125 mg/dL — ABNORMAL HIGH (ref 70–99)
Glucose-Capillary: 132 mg/dL — ABNORMAL HIGH (ref 70–99)
Glucose-Capillary: 112 mg/dL — ABNORMAL HIGH (ref 70–99)

## 2022-12-31 LAB — MAGNESIUM: Magnesium: 2.1 mg/dL (ref 1.7–2.4)

## 2022-12-31 MED ORDER — DOXYCYCLINE HYCLATE 100 MG PO TABS
100.0000 mg | ORAL_TABLET | Freq: Two times a day (BID) | ORAL | Status: AC
  Administered 2022-12-31 – 2023-01-05 (×10): 100 mg via ORAL
  Filled 2022-12-31 (×10): qty 1

## 2022-12-31 MED ORDER — GUAIFENESIN 100 MG/5ML PO LIQD
15.0000 mL | Freq: Once | ORAL | Status: AC
Start: 2022-12-31 — End: 2022-12-31
  Administered 2022-12-31: 15 mL via ORAL
  Filled 2022-12-31: qty 15

## 2022-12-31 MED ORDER — METHYLPREDNISOLONE SODIUM SUCC 40 MG IJ SOLR
40.0000 mg | Freq: Two times a day (BID) | INTRAMUSCULAR | Status: DC
  Administered 2022-12-31 – 2023-01-04 (×8): 40 mg via INTRAVENOUS
  Filled 2022-12-31 (×8): qty 1

## 2022-12-31 MED ORDER — DM-GUAIFENESIN ER 30-600 MG PO TB12
1.0000 | ORAL_TABLET | Freq: Two times a day (BID) | ORAL | Status: DC
Start: 2022-12-31 — End: 2023-01-07
  Administered 2022-12-31 – 2023-01-07 (×15): 1 via ORAL
  Filled 2022-12-31 (×15): qty 1

## 2022-12-31 NOTE — Plan of Care (Signed)

## 2022-12-31 NOTE — Progress Notes (Addendum)
PROGRESS NOTE  Terri Casey, is a 62 y.o. female, DOB - Jul 08, 1960, VHQ:[id number]  Admit date - 12/30/2022   Admitting Physician Terri Hoover Brunette, DO  Outpatient Primary MD for the patient is Terri Bales, NP  LOS - 1  Chief Complaint  Patient presents with   Shortness of Breath      Brief Narrative:  -62 y.o. female with medical history significant for COPD with chronic hypoxemia 3 L nasal cannula, PAF on Xarelto, chronic diastolic CHF, hypothyroidism, and depression admitted on 12/30/2022 with acute on chronic hypoxic and hypercapnic respiratory failure in the setting of COPD exacerbation --Covid  Negative   -Assessment and Plan: 1)Acute on chronic hypoxic and hypercapnic respiratory failure secondary to COPD exacerbation- -COVID-negative -Cough and dyspnea improving  -Oxygen requirement also improving--off BiPAP--- weaned back to nasal cannula -Continue IV Solu-Medrol, oral doxycycline, -Continue mucolytic's and bronchodilators  2)PAFib--- stable at this time, actually appears to be in sinus rhythm  -continue metoprolol for rate control -Continue Xarelto for stroke prophylaxis  3) chronic diastolic dysfunction CHF--echo from 09/21/2018 with EF of 60 to 65% with grade 1 diastolic dysfunction, mild LVH -- Continue metoprolol, -Appears euvolemic, no indication for diuretics at this time  4)HypoThyroidism--- continue levothyroxine  5)GERD--- continue Protonix especially while on steroids  6)Depression/Anxiety--- stable, continue Celexa  7)Tobacco Abuse----  Smoking cessation counseling for 4 minutes today,  -Offered nicotine patch, patient declined I have discussed tobacco cessation with the patient.  I have counseled the patient regarding the negative impacts of continued tobacco use including but not limited to lung cancer, COPD, and cardiovascular disease.  I have discussed alternatives to tobacco and modalities that may help facilitate tobacco cessation including but  not limited to biofeedback, hypnosis, and medications.  Total time spent with tobacco counseling was 4 minutes.  Status is: Inpatient   Disposition: The patient is from: Home              Anticipated d/c is to: Home with [facility]              Anticipated d/c date is: 1 day              Patient currently is not medically stable to d/c. Barriers: Not Clinically Stable-   Code Status : -  Code Status: Limited: Do not attempt resuscitation (DNR) -DNR-LIMITED -Do Not Intubate/DNI    Family Communication:    NA (patient is alert, awake and coherent)   DVT Prophylaxis  :   - SCDs  rivaroxaban (XARELTO) tablet 20 mg   Lab Results  Component Value Date   PLT 99 (L) 12/31/2022   Inpatient Medications  Scheduled Meds:  atorvastatin  20 mg Oral QHS   bethanechol  5 mg Oral BID   budesonide (PULMICORT) nebulizer solution  0.25 mg Nebulization BID   Chlorhexidine Gluconate Cloth  6 each Topical Daily   escitalopram  10 mg Oral Daily   folic acid  1 mg Oral Daily   insulin aspart  0-9 Units Subcutaneous Q4H   levalbuterol  0.63 mg Nebulization Q6H   levothyroxine  200 mcg Oral Q0600   loratadine  10 mg Oral Daily   methylPREDNISolone (SOLU-MEDROL) injection  80 mg Intravenous Q12H   metoprolol tartrate  5 mg Intravenous Q6H   metoprolol tartrate  12.5 mg Oral BID   pantoprazole  40 mg Oral Daily   rivaroxaban  20 mg Oral Q supper   topiramate  100 mg Oral BID   Continuous Infusions:  doxycycline (VIBRAMYCIN) IV 100 mg (12/31/22 0925)   PRN Meds:.acetaminophen **OR** acetaminophen, cyclobenzaprine, LORazepam, ondansetron **OR** ondansetron (ZOFRAN) IV, SUMAtriptan   Anti-infectives (From admission, onward)    Start     Dose/Rate Route Frequency Ordered Stop   12/30/22 1200  doxycycline (VIBRAMYCIN) 100 mg in dextrose 5 % 250 mL IVPB        100 mg 125 mL/hr over 120 Minutes Intravenous 2 times daily 12/30/22 1052         Subjective: Terri Casey today has no fevers, no emesis,   No chest pain,  Cough and dyspnea improving  -Oxygen requirement also improving--off BiPAP--- weaned back to nasal cannula  Objective: Vitals:   12/31/22 0801 12/31/22 0846 12/31/22 0847 12/31/22 0927  BP: (!) 132/51   (!) 119/46  Casey: (!) 113 (!) 105 (!) 103 (!) 101  Resp: (!) 32 (!) 26    Temp:      TempSrc:      SpO2: (!) 83% 96% 96%   Weight:      Height:        Intake/Output Summary (Last 24 hours) at 12/31/2022 0942 Last data filed at 12/31/2022 0430 Gross per 24 hour  Intake 500 ml  Output 725 ml  Net -225 ml   Filed Weights   12/30/22 0603 12/30/22 1146  Weight: 99 kg 84.5 kg   Physical Exam Gen:- Awake Alert,  in no apparent distress , no conversational dyspnea HEENT:- [place].AT, No sclera icterus Nose---[place] 4L/min--weaned off BiPAP Neck-Supple Neck,No JVD,.  Lungs-improving air movement, few scattered wheezes  CV- S1, S2 normal, regular  Abd-  +ve B.Sounds, Abd Soft, No tenderness,    Extremity/Skin:- No  edema, pedal pulses present  Psych-affect is appropriate, oriented x3 Neuro-generalized weakness, no new focal deficits, no tremors  Data Reviewed: I have personally reviewed following labs and imaging studies  CBC: Recent Labs  Lab 12/30/22 0555 12/31/22 0537  WBC 8.8 3.7*  NEUTROABS 7.7  --   HGB 12.5 10.2*  HCT 44.9 35.7*  MCV 110.3* 103.2*  PLT 101* 99*   Basic Metabolic Panel: Recent Labs  Lab 12/30/22 0555 12/31/22 0410  NA 141 139  K 3.7 4.5  CL 93* 94*  CO2 37* 35*  GLUCOSE 134* 113*  BUN 14 23  CREATININE 0.58 0.55  CALCIUM 9.6 9.7  MG  --  2.1   GFR: Estimated Creatinine Clearance: 82.4 mL/min (by C-G formula based on SCr of 0.55 mg/dL).  Recent Results (from the past 240 hours)  MRSA Next Gen by PCR, Nasal     Status: None   Collection Time: 12/30/22 10:46 AM   Specimen: Nasal Mucosa; Nasal Swab  Result Value Ref Range Status   MRSA by PCR Next Gen NOT DETECTED NOT DETECTED Final    Comment: (NOTE) The GeneXpert MRSA  Assay (FDA approved for NASAL specimens only), is one component of a comprehensive MRSA colonization surveillance program. It is not intended to diagnose MRSA infection nor to guide or monitor treatment for MRSA infections. Test performance is not FDA approved in patients less than 35 years old. Performed at [facility], [address] [place], [place] [address]   SARS Coronavirus 2 by RT PCR (hospital order, performed in [facility] hospital lab) *cepheid single result test* Anterior Nasal Swab     Status: None   Collection Time: 12/30/22 10:46 AM   Specimen: Anterior Nasal Swab  Result Value Ref Range Status   SARS Coronavirus 2 by RT PCR NEGATIVE  NEGATIVE Final    Comment: (NOTE) SARS-CoV-2 target nucleic acids are NOT DETECTED.  The SARS-CoV-2 RNA is generally detectable in upper and lower respiratory specimens during the acute phase of infection. The lowest concentration of SARS-CoV-2 viral copies this assay can detect is 250 copies / mL. A negative result does not preclude SARS-CoV-2 infection and should not be used as the sole basis for treatment or other patient management decisions.  A negative result may occur with improper specimen collection / handling, submission of specimen other than nasopharyngeal swab, presence of viral mutation(s) within the areas targeted by this assay, and inadequate number of viral copies (<250 copies / mL). A negative result must be combined with clinical observations, patient history, and epidemiological information.  Fact Sheet for Patients:   RoadLapTop.co.za  Fact Sheet for Healthcare Providers: http://kim-miller.com/  This test is not yet approved or  cleared by the [place] FDA and has been authorized for detection and/or diagnosis of SARS-CoV-2 by FDA under an Emergency Use Authorization (EUA).  This EUA will remain in effect (meaning this test can be used) for the duration of  the COVID-19 declaration under Section 564(b)(1) of the Act, 21 U.S.C. section 360bbb-3(b)(1), unless the authorization is terminated or revoked sooner.  Performed at [facility], [address], [place], [place] [address]   Respiratory (~20 pathogens) panel by PCR     Status: None   Collection Time: 12/30/22 10:51 AM   Specimen: Nasopharyngeal Swab; Respiratory  Result Value Ref Range Status   Adenovirus NOT DETECTED NOT DETECTED Final   Coronavirus 229E NOT DETECTED NOT DETECTED Final    Comment: (NOTE) The Coronavirus on the Respiratory Panel, DOES NOT test for the novel  Coronavirus (2019 nCoV)    Coronavirus HKU1 NOT DETECTED NOT DETECTED Final   Coronavirus NL63 NOT DETECTED NOT DETECTED Final   Coronavirus OC43 NOT DETECTED NOT DETECTED Final   Metapneumovirus NOT DETECTED NOT DETECTED Final   Rhinovirus / Enterovirus NOT DETECTED NOT DETECTED Final   Influenza A NOT DETECTED NOT DETECTED Final   Influenza B NOT DETECTED NOT DETECTED Final   Parainfluenza Virus 1 NOT DETECTED NOT DETECTED Final   Parainfluenza Virus 2 NOT DETECTED NOT DETECTED Final   Parainfluenza Virus 3 NOT DETECTED NOT DETECTED Final   Parainfluenza Virus 4 NOT DETECTED NOT DETECTED Final   Respiratory Syncytial Virus NOT DETECTED NOT DETECTED Final   Bordetella pertussis NOT DETECTED NOT DETECTED Final   Bordetella Parapertussis NOT DETECTED NOT DETECTED Final   Chlamydophila pneumoniae NOT DETECTED NOT DETECTED Final   Mycoplasma pneumoniae NOT DETECTED NOT DETECTED Final    Comment: Performed at [facility], [address] [place], [place] [address]    Radiology Studies: DG Chest [place] 1 View Result Date: 12/30/2022 CLINICAL DATA:  62 year old female with sick contacts at home, found hypoxic on 2 liters nasal cannula home oxygen. Shortness of breath. EXAM: PORTABLE CHEST 1 VIEW COMPARISON:  Portable chest 08/18/2022 and earlier. FINDINGS: Portable AP semi upright view at 0726 hours.  Centrilobular emphysema confirmed on CT last year. Stable large lung volumes. Mediastinal contours are stable and within normal limits. Visualized tracheal air column is within normal limits. No pneumothorax. No pleural effusion or consolidation. No pulmonary edema. And no convincing acute pulmonary opacity. No acute osseous abnormality identified. Paucity of bowel gas in the upper abdomen. IMPRESSION: Emphysema ([id number]) with no acute cardiopulmonary abnormality identified. Electronically Signed   By: Odessa Fleming M.D.   On:  12/30/2022 07:35   Scheduled Meds:  atorvastatin  20 mg Oral QHS   bethanechol  5 mg Oral BID   budesonide (PULMICORT) nebulizer solution  0.25 mg Nebulization BID   Chlorhexidine Gluconate Cloth  6 each Topical Daily   escitalopram  10 mg Oral Daily   folic acid  1 mg Oral Daily   insulin aspart  0-9 Units Subcutaneous Q4H   levalbuterol  0.63 mg Nebulization Q6H   levothyroxine  200 mcg Oral Q0600   loratadine  10 mg Oral Daily   methylPREDNISolone (SOLU-MEDROL) injection  80 mg Intravenous Q12H   metoprolol tartrate  5 mg Intravenous Q6H   metoprolol tartrate  12.5 mg Oral BID   pantoprazole  40 mg Oral Daily   rivaroxaban  20 mg Oral Q supper   topiramate  100 mg Oral BID   Continuous Infusions:  doxycycline (VIBRAMYCIN) IV 100 mg (12/31/22 0925)    LOS: 1 day   Shon Hale M.D on 12/31/2022 at 9:42 AM  Go to [email or web address] - for contact info  [facility] - Office  [phone]  If 7PM-7AM, please contact night-coverage [email or web address] 12/31/2022, 9:42 AM

## 2022-12-31 NOTE — Progress Notes (Signed)
   12/31/22 1546  TOC Brief Assessment  Patient has primary care physician Yes  Home environment has been reviewed Single Family Home  Prior level of function: Independent  Prior/Current Home Services No current home services  Social Drivers of Health Review SDOH reviewed no interventions necessary  Readmission risk has been reviewed Yes  Transition of care needs no transition of care needs at this time    Transition of Care Department [facility]) has reviewed patient and no TOC needs have been identified at this time. We will continue to monitor patient advancement through interdisciplinary progression rounds. If new patient transition needs arise, please place a TOC consult.

## 2023-01-01 ENCOUNTER — Inpatient Hospital Stay (HOSPITAL_COMMUNITY): Payer: Medicaid Other

## 2023-01-01 DIAGNOSIS — J9601 Acute respiratory failure with hypoxia: Secondary | ICD-10-CM | POA: Diagnosis not present

## 2023-01-01 LAB — BLOOD GAS, ARTERIAL
Acid-Base Excess: 12.1 mmol/L — ABNORMAL HIGH (ref 0.0–2.0)
Bicarbonate: 44.1 mmol/L — ABNORMAL HIGH (ref 20.0–28.0)
Drawn by: 27016
O2 Saturation: 84 %
Patient temperature: 37
pCO2 arterial: 103 mm[Hg] (ref 32–48)
pH, Arterial: 7.24 — ABNORMAL LOW (ref 7.35–7.45)
pO2, Arterial: 50 mm[Hg] — ABNORMAL LOW (ref 83–108)

## 2023-01-01 LAB — GLUCOSE, CAPILLARY
Glucose-Capillary: 105 mg/dL — ABNORMAL HIGH (ref 70–99)
Glucose-Capillary: 117 mg/dL — ABNORMAL HIGH (ref 70–99)
Glucose-Capillary: 121 mg/dL — ABNORMAL HIGH (ref 70–99)
Glucose-Capillary: 73 mg/dL (ref 70–99)
Glucose-Capillary: 87 mg/dL (ref 70–99)
Glucose-Capillary: 99 mg/dL (ref 70–99)

## 2023-01-01 MED ORDER — IPRATROPIUM-ALBUTEROL 0.5-2.5 (3) MG/3ML IN SOLN
3.0000 mL | Freq: Once | RESPIRATORY_TRACT | Status: AC
  Administered 2023-01-01: 3 mL via RESPIRATORY_TRACT
  Filled 2023-01-01: qty 3

## 2023-01-01 NOTE — Progress Notes (Signed)
 PROGRESS NOTE  Terri Casey, is a 62 y.o. female, DOB - 07-28-1960, [id number]  Admit date - 12/30/2022   Admitting Physician Pratik JONETTA Fairly, DO  Outpatient Primary MD for the patient is Joshua Rayfield RAMAN, NP  LOS - 2  Chief Complaint  Patient presents with   Shortness of Breath      Brief Narrative:  -62 y.o. female with medical history significant for COPD with chronic hypoxemia 3 L nasal cannula, PAF on Xarelto , chronic diastolic CHF, hypothyroidism, and depression admitted on 12/30/2022 with acute on chronic hypoxic and hypercapnic respiratory failure in the setting of COPD exacerbation --Covid  Negative   -Assessment and Plan: 1)Acute on chronic hypoxic and hypercapnic respiratory failure secondary to COPD exacerbation- -COVID-negative 01/01/23 -Patient with increasing shortness of breath, hypoxia and lethargy -Repeat ABG on 12/22/2022 with pH of 7.24 pCO2 of 100, pO2 of 50 bicarb of 44, acid base excess of 12 -Transfer back to stepdown unit for continuous BiPAP use given persistent hypoxia Anemia, worsening hypoxia and lethargy -Continue IV Solu-Medrol , oral doxycycline , -Continue mucolytic's and bronchodilators  2)PAFib--- stable at this time, actually appears to be in sinus rhythm  -continue metoprolol  for rate control -Continue Xarelto  for stroke prophylaxis  3) chronic diastolic dysfunction CHF--echo from 09/21/2018 with EF of 60 to 65% with grade 1 diastolic dysfunction, mild LVH -- Continue metoprolol , -Appears euvolemic, no indication for diuretics at this time  4)HypoThyroidism--- continue levothyroxine   5)GERD--- continue Protonix  especially while on steroids  6)Depression/Anxiety--- stable, continue Celexa   7)Tobacco Abuse----smoking cessation advised, patient declines nicotine  patch  Status is: Inpatient   Disposition: The patient is from: Home              Anticipated d/c is to: Home with [facility]              Anticipated d/c date is: 3 days               Patient currently is not medically stable to d/c. Barriers: Not Clinically Stable-   Code Status : -  Code Status: Limited: Do not attempt resuscitation (DNR) -DNR-LIMITED -Do Not Intubate/DNI    Family Communication:    NA (patient is alert, awake and coherent)   DVT Prophylaxis  :   - SCDs  rivaroxaban  (XARELTO ) tablet 20 mg   Lab Results  Component Value Date   PLT 99 (L) 12/31/2022   Inpatient Medications  Scheduled Meds:  atorvastatin   20 mg Oral QHS   bethanechol   5 mg Oral BID   budesonide  (PULMICORT ) nebulizer solution  0.25 mg Nebulization BID   Chlorhexidine  Gluconate Cloth  6 each Topical Daily   dextromethorphan -guaiFENesin   1 tablet Oral BID   doxycycline   100 mg Oral Q12H   escitalopram   10 mg Oral Daily   folic acid   1 mg Oral Daily   insulin  aspart  0-9 Units Subcutaneous Q4H   levalbuterol   0.63 mg Nebulization Q6H   levothyroxine   200 mcg Oral Q0600   loratadine   10 mg Oral Daily   methylPREDNISolone  (SOLU-MEDROL ) injection  40 mg Intravenous Q12H   metoprolol  tartrate  5 mg Intravenous Q6H   metoprolol  tartrate  12.5 mg Oral BID   pantoprazole   40 mg Oral Daily   rivaroxaban   20 mg Oral Q supper   topiramate   100 mg Oral BID   Continuous Infusions:   PRN Meds:.acetaminophen  **OR** acetaminophen , cyclobenzaprine , LORazepam , ondansetron  **OR** ondansetron  (ZOFRAN ) IV, SUMAtriptan    Anti-infectives (From admission, onward)    Start  Dose/Rate Route Frequency Ordered Stop   12/31/22 2000  doxycycline  (VIBRA -TABS) tablet 100 mg        100 mg Oral Every 12 hours 12/31/22 1012 01/05/23 2159   12/30/22 1200  doxycycline  (VIBRAMYCIN ) 100 mg in dextrose  5 % 250 mL IVPB        100 mg 125 mL/hr over 120 Minutes Intravenous 2 times daily 12/30/22 1052 01/01/23 0836       Subjective: Terri Casey today has no fevers, no emesis,  No chest pain,  Cough and dyspnea persist -Patient with worsening lethargy, hypoxia and dyspnea --Transfer back to  stepdown unit for continuous BiPAP use given persistent hypoxia Anemia, worsening hypoxia and lethargy  Objective: Vitals:   01/01/23 1711 01/01/23 1712 01/01/23 1719 01/01/23 1800  BP:    (!) 129/111  Pulse: 95 84 82 81  Resp: (!) 28 (!) 28 (!) 25 (!) 27  Temp:      TempSrc:      SpO2: (!) 87% 90% 94% 91%  Weight:      Height:        Intake/Output Summary (Last 24 hours) at 01/01/2023 1855 Last data filed at 01/01/2023 1710 Gross per 24 hour  Intake 700 ml  Output --  Net 700 ml   Filed Weights   12/30/22 0603 12/30/22 1146  Weight: 99 kg 84.5 kg   Physical Exam Gen:-Somewhat sleepy but arousable, tachypneic  HEENT:- [place].AT, No sclera icterus Nose---BiPAP mask  Neck-Supple Neck,No JVD,.  Lungs-decreased air movement, few scattered wheezes  CV- S1, S2 normal, regular  Abd-  +ve B.Sounds, Abd Soft, No tenderness,    Extremity/Skin:- No  edema, pedal pulses present  Psych-affect is flat , somewhat sleepy oriented x3 Neuro-generalized weakness, no new focal deficits, no tremors  Data Reviewed: I have personally reviewed following labs and imaging studies  CBC: Recent Labs  Lab 12/30/22 0555 12/31/22 0537  WBC 8.8 3.7*  NEUTROABS 7.7  --   HGB 12.5 10.2*  HCT 44.9 35.7*  MCV 110.3* 103.2*  PLT 101* 99*   Basic Metabolic Panel: Recent Labs  Lab 12/30/22 0555 12/31/22 0410  NA 141 139  K 3.7 4.5  CL 93* 94*  CO2 37* 35*  GLUCOSE 134* 113*  BUN 14 23  CREATININE 0.58 0.55  CALCIUM  9.6 9.7  MG  --  2.1   GFR: Estimated Creatinine Clearance: 82.4 mL/min (by C-G formula based on SCr of 0.55 mg/dL).  Recent Results (from the past 240 hours)  MRSA Next Gen by PCR, Nasal     Status: None   Collection Time: 12/30/22 10:46 AM   Specimen: Nasal Mucosa; Nasal Swab  Result Value Ref Range Status   MRSA by PCR Next Gen NOT DETECTED NOT DETECTED Final    Comment: (NOTE) The GeneXpert MRSA Assay (FDA approved for NASAL specimens only), is one component of a  comprehensive MRSA colonization surveillance program. It is not intended to diagnose MRSA infection nor to guide or monitor treatment for MRSA infections. Test performance is not FDA approved in patients less than 26 years old. Performed at [facility], [address] [place], [place] [address]   SARS Coronavirus 2 by RT PCR (hospital order, performed in [facility] hospital lab) *cepheid single result test* Anterior Nasal Swab     Status: None   Collection Time: 12/30/22 10:46 AM   Specimen: Anterior Nasal Swab  Result Value Ref Range Status   SARS Coronavirus 2 by RT PCR NEGATIVE NEGATIVE Final  Comment: (NOTE) SARS-CoV-2 target nucleic acids are NOT DETECTED.  The SARS-CoV-2 RNA is generally detectable in upper and lower respiratory specimens during the acute phase of infection. The lowest concentration of SARS-CoV-2 viral copies this assay can detect is 250 copies / mL. A negative result does not preclude SARS-CoV-2 infection and should not be used as the sole basis for treatment or other patient management decisions.  A negative result may occur with improper specimen collection / handling, submission of specimen other than nasopharyngeal swab, presence of viral mutation(s) within the areas targeted by this assay, and inadequate number of viral copies (<250 copies / mL). A negative result must be combined with clinical observations, patient history, and epidemiological information.  Fact Sheet for Patients:   roadlaptop.co.za  Fact Sheet for Healthcare Providers: http://kim-miller.com/  This test is not yet approved or  cleared by the [place]  FDA and has been authorized for detection and/or diagnosis of SARS-CoV-2 by FDA under an Emergency Use Authorization (EUA).  This EUA will remain in effect (meaning this test can be used) for the duration of the COVID-19 declaration under Section 564(b)(1) of the Act, 21  U.S.C. section 360bbb-3(b)(1), unless the authorization is terminated or revoked sooner.  Performed at [facility], [address] [place], [place] [address]   Respiratory (~20 pathogens) panel by PCR     Status: None   Collection Time: 12/30/22 10:51 AM   Specimen: Nasopharyngeal Swab; Respiratory  Result Value Ref Range Status   Adenovirus NOT DETECTED NOT DETECTED Final   Coronavirus 229E NOT DETECTED NOT DETECTED Final    Comment: (NOTE) The Coronavirus on the Respiratory Panel, DOES NOT test for the novel  Coronavirus (2019 nCoV)    Coronavirus HKU1 NOT DETECTED NOT DETECTED Final   Coronavirus NL63 NOT DETECTED NOT DETECTED Final   Coronavirus OC43 NOT DETECTED NOT DETECTED Final   Metapneumovirus NOT DETECTED NOT DETECTED Final   Rhinovirus / Enterovirus NOT DETECTED NOT DETECTED Final   Influenza A NOT DETECTED NOT DETECTED Final   Influenza B NOT DETECTED NOT DETECTED Final   Parainfluenza Virus 1 NOT DETECTED NOT DETECTED Final   Parainfluenza Virus 2 NOT DETECTED NOT DETECTED Final   Parainfluenza Virus 3 NOT DETECTED NOT DETECTED Final   Parainfluenza Virus 4 NOT DETECTED NOT DETECTED Final   Respiratory Syncytial Virus NOT DETECTED NOT DETECTED Final   Bordetella pertussis NOT DETECTED NOT DETECTED Final   Bordetella Parapertussis NOT DETECTED NOT DETECTED Final   Chlamydophila pneumoniae NOT DETECTED NOT DETECTED Final   Mycoplasma pneumoniae NOT DETECTED NOT DETECTED Final    Comment: Performed at [facility], [address], [place], [place] [address]    Radiology Studies: DG CHEST PORT 1 VIEW Result Date: 01/01/2023 CLINICAL DATA:  Shortness of breath EXAM: PORTABLE CHEST 1 VIEW COMPARISON:  12/30/2022 FINDINGS: Hyperinflation compatible with COPD. Heart and mediastinal contours within normal limits. No acute confluent airspace opacities or effusions. No acute bony abnormality. IMPRESSION: Emphysema.  No active disease. Electronically Signed   By: Franky Crease M.D.   On: 01/01/2023 15:12   Scheduled Meds:  atorvastatin   20 mg Oral QHS   bethanechol   5 mg Oral BID   budesonide  (PULMICORT ) nebulizer solution  0.25 mg Nebulization BID   Chlorhexidine  Gluconate Cloth  6 each Topical Daily   dextromethorphan -guaiFENesin   1 tablet Oral BID   doxycycline   100 mg Oral Q12H   escitalopram   10 mg Oral Daily   folic acid   1  mg Oral Daily   insulin  aspart  0-9 Units Subcutaneous Q4H   levalbuterol   0.63 mg Nebulization Q6H   levothyroxine   200 mcg Oral Q0600   loratadine   10 mg Oral Daily   methylPREDNISolone  (SOLU-MEDROL ) injection  40 mg Intravenous Q12H   metoprolol  tartrate  5 mg Intravenous Q6H   metoprolol  tartrate  12.5 mg Oral BID   pantoprazole   40 mg Oral Daily   rivaroxaban   20 mg Oral Q supper   topiramate   100 mg Oral BID   Continuous Infusions:   LOS: 2 days   Rendall Carwin M.D on 01/01/2023 at 6:55 PM  Go to [email or web address] - for contact info  [facility] - Office  [phone]  If 7PM-7AM, please contact night-coverage [email or web address] 01/01/2023, 6:55 PM

## 2023-01-01 NOTE — Progress Notes (Addendum)
 Date and time results received: 01/01/23 1145  Test: PCO2 Critical Value: 103  Name of Provider Notified: MD Courage   Orders Received? Or Actions Taken?:

## 2023-01-01 NOTE — Progress Notes (Signed)
 Mobility Specialist Progress Note:    01/01/23 1034  Mobility  Activity Transferred from chair to bed  Level of Assistance Contact guard assist, steadying assist  Assistive Device None  Distance Ambulated (ft) 2 ft  Range of Motion/Exercises Active;All extremities  Activity Response RN notified  Mobility Referral Yes  Mobility visit 1 Mobility  Mobility Specialist Start Time (ACUTE ONLY) 1005  Mobility Specialist Stop Time (ACUTE ONLY) 1030  Mobility Specialist Time Calculation (min) (ACUTE ONLY) 25 min   Pt received requesting assistance to bed and c/o SOB. Required CGA to stand and transfer with no AD. Pt desat, SpO2 52% on 4L. Notified nurse for assistance. Slowly recovered, SpO2 94% on 6L. Left pt with nurse, all needs met.   Sherrilee Ditty Mobility Specialist Please contact via Special Educational Needs Teacher or  Rehab office at [phone]

## 2023-01-01 NOTE — Progress Notes (Signed)
 Took patient off Bipap to swab her mouth and add moisturizer to her lips.

## 2023-01-01 NOTE — Plan of Care (Signed)
  Problem: Education: Goal: Knowledge of General Education information will improve Description: Including pain rating scale, medication(s)/side effects and non-pharmacologic comfort measures Outcome: Progressing   Problem: Health Behavior/Discharge Planning: Goal: Ability to manage health-related needs will improve Outcome: Progressing   Problem: Clinical Measurements: Goal: Ability to maintain clinical measurements within normal limits will improve Outcome: Progressing Goal: Will remain free from infection Outcome: Progressing Goal: Diagnostic test results will improve Outcome: Progressing Goal: Respiratory complications will improve Outcome: Progressing   Problem: Activity: Goal: Risk for activity intolerance will decrease Outcome: Progressing   Problem: Nutrition: Goal: Adequate nutrition will be maintained Outcome: Progressing   Problem: Coping: Goal: Level of anxiety will decrease Outcome: Progressing   Problem: Elimination: Goal: Will not experience complications related to bowel motility Outcome: Progressing Goal: Will not experience complications related to urinary retention Outcome: Progressing   Problem: Pain Management: Goal: General experience of comfort will improve Outcome: Progressing   Problem: Safety: Goal: Ability to remain free from injury will improve Outcome: Progressing

## 2023-01-02 DIAGNOSIS — J9601 Acute respiratory failure with hypoxia: Secondary | ICD-10-CM | POA: Diagnosis not present

## 2023-01-02 LAB — BLOOD GAS, VENOUS
Acid-Base Excess: 14.8 mmol/L — ABNORMAL HIGH (ref 0.0–2.0)
Bicarbonate: 45.3 mmol/L — ABNORMAL HIGH (ref 20.0–28.0)
Drawn by: 1528
O2 Saturation: 85.9 %
Patient temperature: 36.8
pCO2, Ven: 87 mm[Hg] (ref 44–60)
pH, Ven: 7.32 (ref 7.25–7.43)
pO2, Ven: 50 mm[Hg] — ABNORMAL HIGH (ref 32–45)

## 2023-01-02 LAB — CBC
HCT: 38.4 % (ref 36.0–46.0)
Hemoglobin: 10.9 g/dL — ABNORMAL LOW (ref 12.0–15.0)
MCH: 30.4 pg (ref 26.0–34.0)
MCHC: 28.4 g/dL — ABNORMAL LOW (ref 30.0–36.0)
MCV: 107 fL — ABNORMAL HIGH (ref 80.0–100.0)
Platelets: 85 10*3/uL — ABNORMAL LOW (ref 150–400)
RBC: 3.59 MIL/uL — ABNORMAL LOW (ref 3.87–5.11)
RDW: 13.4 % (ref 11.5–15.5)
WBC: 4.1 10*3/uL (ref 4.0–10.5)
nRBC: 0 % (ref 0.0–0.2)

## 2023-01-02 LAB — BASIC METABOLIC PANEL
Anion gap: 8 (ref 5–15)
BUN: 25 mg/dL — ABNORMAL HIGH (ref 8–23)
CO2: 39 mmol/L — ABNORMAL HIGH (ref 22–32)
Calcium: 9.3 mg/dL (ref 8.9–10.3)
Chloride: 93 mmol/L — ABNORMAL LOW (ref 98–111)
Creatinine, Ser: 0.53 mg/dL (ref 0.44–1.00)
GFR, Estimated: >60 mL/min (ref >60)
Glucose, Bld: 112 mg/dL — ABNORMAL HIGH (ref 70–99)
Potassium: 4.5 mmol/L (ref 3.5–5.1)
Sodium: 140 mmol/L (ref 135–145)

## 2023-01-02 LAB — GLUCOSE, CAPILLARY
Glucose-Capillary: 112 mg/dL — ABNORMAL HIGH (ref 70–99)
Glucose-Capillary: 70 mg/dL (ref 70–99)
Glucose-Capillary: 90 mg/dL (ref 70–99)
Glucose-Capillary: 91 mg/dL (ref 70–99)
Glucose-Capillary: 96 mg/dL (ref 70–99)
Glucose-Capillary: 96 mg/dL (ref 70–99)

## 2023-01-02 NOTE — Progress Notes (Signed)
 PROGRESS NOTE  Terri Casey, is a 63 y.o. female, DOB - 02-03-1960, FMW:[id number]  Admit date - 12/30/2022   Admitting Physician Pratik JONETTA Fairly, DO  Outpatient Primary MD for the patient is Joshua Rayfield RAMAN, NP  LOS - 3  Chief Complaint  Patient presents with   Shortness of Breath      Brief Narrative:  -63 y.o. female with medical history significant for COPD with chronic hypoxemia 3 L nasal cannula, PAF on Xarelto , chronic diastolic CHF, hypothyroidism, and depression admitted on 12/30/2022 with acute on chronic hypoxic and hypercapnic respiratory failure in the setting of COPD exacerbation --Covid  Negative   -Assessment and Plan: 1)Acute on chronic hypoxic and hypercapnic respiratory failure secondary to COPD exacerbation- -COVID-negative 01/02/23 -Dyspnea and hypoxia persist -Repeat ABG on 01/01/2023 with pH of 7.24 pCO2 of 100, pO2 of 50 bicarb of 44, acid base excess of 12 -More awake after BiPAP use -Continue IV Solu-Medrol , oral doxycycline , -Continue mucolytic's and bronchodilators -BiPAP alternating with nasal cannula oxygen  2)PAFib--- stable at this time, actually appears to be in sinus rhythm  -continue metoprolol  for rate control -Continue Xarelto  for stroke prophylaxis  3) chronic diastolic dysfunction CHF--echo from 09/21/2018 with EF of 60 to 65% with grade 1 diastolic dysfunction, mild LVH -- Continue metoprolol , -Appears euvolemic, no indication for diuretics at this time  4)Hypothyroidism--- continue levothyroxine   5)GERD--- continue Protonix  especially while on steroids  6)Depression/Anxiety--- stable, continue Celexa   7)Tobacco Abuse----smoking cessation advised, patient declines nicotine  patch  Status is: Inpatient   Disposition: The patient is from: Home              Anticipated d/c is to: [facility] with [facility]              Anticipated d/c date is: 3 days              Patient currently is not medically stable to d/c. Barriers: Not Clinically Stable-    Code Status : -  Code Status: Limited: Do not attempt resuscitation (DNR) -DNR-LIMITED -Do Not Intubate/DNI    Family Communication:    NA (patient is alert, awake and coherent)   DVT Prophylaxis  :   - SCDs  rivaroxaban  (XARELTO ) tablet 20 mg   Lab Results  Component Value Date   PLT 85 (L) 01/02/2023   Inpatient Medications  Scheduled Meds:  atorvastatin   20 mg Oral QHS   bethanechol   5 mg Oral BID   budesonide  (PULMICORT ) nebulizer solution  0.25 mg Nebulization BID   Chlorhexidine  Gluconate Cloth  6 each Topical Daily   dextromethorphan -guaiFENesin   1 tablet Oral BID   doxycycline   100 mg Oral Q12H   escitalopram   10 mg Oral Daily   folic acid   1 mg Oral Daily   insulin  aspart  0-9 Units Subcutaneous Q4H   levalbuterol   0.63 mg Nebulization Q6H   levothyroxine   200 mcg Oral Q0600   loratadine   10 mg Oral Daily   methylPREDNISolone  (SOLU-MEDROL ) injection  40 mg Intravenous Q12H   metoprolol  tartrate  5 mg Intravenous Q6H   metoprolol  tartrate  12.5 mg Oral BID   pantoprazole   40 mg Oral Daily   rivaroxaban   20 mg Oral Q supper   topiramate   100 mg Oral BID   Continuous Infusions:   PRN Meds:.acetaminophen  **OR** acetaminophen , cyclobenzaprine , LORazepam , ondansetron  **OR** ondansetron  (ZOFRAN ) IV, SUMAtriptan    Anti-infectives (From admission, onward)    Start     Dose/Rate Route Frequency Ordered Stop   12/31/22  2000  doxycycline  (VIBRA -TABS) tablet 100 mg        100 mg Oral Every 12 hours 12/31/22 1012 01/05/23 2159   12/30/22 1200  doxycycline  (VIBRAMYCIN ) 100 mg in dextrose  5 % 250 mL IVPB        100 mg 125 mL/hr over 120 Minutes Intravenous 2 times daily 12/30/22 1052 01/01/23 0836       Subjective: Terri Casey today has no fevers, no emesis,  No chest pain,  Cough and dyspnea persist -Patient is more awake after using BiPAP  Objective: Vitals:   01/02/23 1426 01/02/23 1500 01/02/23 1600 01/02/23 1607  BP:  126/61 (!) 124/57   Pulse:  81 77  77  Resp:  16 (!) 26 (!) 21  Temp:    97.6 F (36.4 C)  TempSrc:    Oral  SpO2: 96% 98% 96% 98%  Weight:      Height:        Intake/Output Summary (Last 24 hours) at 01/02/2023 1717 Last data filed at 01/02/2023 1300 Gross per 24 hour  Intake 240 ml  Output --  Net 240 ml   Filed Weights   12/30/22 0603 12/30/22 1146  Weight: 99 kg 84.5 kg   Physical Exam Gen:-Less sleepy, more arousable, in no acute distress HEENT:- [place].AT, No sclera icterus Nose---BiPAP mask alternating with nasal cannula Neck-Supple Neck,No JVD,.  Lungs-decreased air movement, few scattered wheezes  CV- S1, S2 normal, regular  Abd-  +ve B.Sounds, Abd Soft, No tenderness,    Extremity/Skin:- No  edema, pedal pulses present  Psych-affect is flat , somewhat sleepy oriented x3 Neuro-generalized weakness, no new focal deficits, no tremors  Data Reviewed: I have personally reviewed following labs and imaging studies  CBC: Recent Labs  Lab 12/30/22 0555 12/31/22 0537 01/02/23 0531  WBC 8.8 3.7* 4.1  NEUTROABS 7.7  --   --   HGB 12.5 10.2* 10.9*  HCT 44.9 35.7* 38.4  MCV 110.3* 103.2* 107.0*  PLT 101* 99* 85*   Basic Metabolic Panel: Recent Labs  Lab 12/30/22 0555 12/31/22 0410 01/02/23 0531  NA 141 139 140  K 3.7 4.5 4.5  CL 93* 94* 93*  CO2 37* 35* 39*  GLUCOSE 134* 113* 112*  BUN 14 23 25*  CREATININE 0.58 0.55 0.53  CALCIUM  9.6 9.7 9.3  MG  --  2.1  --    GFR: Estimated Creatinine Clearance: 82.4 mL/min (by C-G formula based on SCr of 0.53 mg/dL).  Recent Results (from the past 240 hours)  MRSA Next Gen by PCR, Nasal     Status: None   Collection Time: 12/30/22 10:46 AM   Specimen: Nasal Mucosa; Nasal Swab  Result Value Ref Range Status   MRSA by PCR Next Gen NOT DETECTED NOT DETECTED Final    Comment: (NOTE) The GeneXpert MRSA Assay (FDA approved for NASAL specimens only), is one component of a comprehensive MRSA colonization surveillance program. It is not intended to diagnose  MRSA infection nor to guide or monitor treatment for MRSA infections. Test performance is not FDA approved in patients less than 35 years old. Performed at [facility], [address], [place], [place] [address]   SARS Coronavirus 2 by RT PCR (hospital order, performed in [facility] hospital lab) *cepheid single result test* Anterior Nasal Swab     Status: None   Collection Time: 12/30/22 10:46 AM   Specimen: Anterior Nasal Swab  Result Value Ref Range Status   SARS Coronavirus 2 by RT PCR NEGATIVE  NEGATIVE Final    Comment: (NOTE) SARS-CoV-2 target nucleic acids are NOT DETECTED.  The SARS-CoV-2 RNA is generally detectable in upper and lower respiratory specimens during the acute phase of infection. The lowest concentration of SARS-CoV-2 viral copies this assay can detect is 250 copies / mL. A negative result does not preclude SARS-CoV-2 infection and should not be used as the sole basis for treatment or other patient management decisions.  A negative result may occur with improper specimen collection / handling, submission of specimen other than nasopharyngeal swab, presence of viral mutation(s) within the areas targeted by this assay, and inadequate number of viral copies (<250 copies / mL). A negative result must be combined with clinical observations, patient history, and epidemiological information.  Fact Sheet for Patients:   roadlaptop.co.za  Fact Sheet for Healthcare Providers: http://kim-miller.com/  This test is not yet approved or  cleared by the [place]  FDA and has been authorized for detection and/or diagnosis of SARS-CoV-2 by FDA under an Emergency Use Authorization (EUA).  This EUA will remain in effect (meaning this test can be used) for the duration of the COVID-19 declaration under Section 564(b)(1) of the Act, 21 U.S.C. section 360bbb-3(b)(1), unless the authorization is terminated or revoked  sooner.  Performed at [facility], [address], [place], [place] [address]   Respiratory (~20 pathogens) panel by PCR     Status: None   Collection Time: 12/30/22 10:51 AM   Specimen: Nasopharyngeal Swab; Respiratory  Result Value Ref Range Status   Adenovirus NOT DETECTED NOT DETECTED Final   Coronavirus 229E NOT DETECTED NOT DETECTED Final    Comment: (NOTE) The Coronavirus on the Respiratory Panel, DOES NOT test for the novel  Coronavirus (2019 nCoV)    Coronavirus HKU1 NOT DETECTED NOT DETECTED Final   Coronavirus NL63 NOT DETECTED NOT DETECTED Final   Coronavirus OC43 NOT DETECTED NOT DETECTED Final   Metapneumovirus NOT DETECTED NOT DETECTED Final   Rhinovirus / Enterovirus NOT DETECTED NOT DETECTED Final   Influenza A NOT DETECTED NOT DETECTED Final   Influenza B NOT DETECTED NOT DETECTED Final   Parainfluenza Virus 1 NOT DETECTED NOT DETECTED Final   Parainfluenza Virus 2 NOT DETECTED NOT DETECTED Final   Parainfluenza Virus 3 NOT DETECTED NOT DETECTED Final   Parainfluenza Virus 4 NOT DETECTED NOT DETECTED Final   Respiratory Syncytial Virus NOT DETECTED NOT DETECTED Final   Bordetella pertussis NOT DETECTED NOT DETECTED Final   Bordetella Parapertussis NOT DETECTED NOT DETECTED Final   Chlamydophila pneumoniae NOT DETECTED NOT DETECTED Final   Mycoplasma pneumoniae NOT DETECTED NOT DETECTED Final    Comment: Performed at [facility], [address], [place], [place] [address]    Radiology Studies: DG CHEST PORT 1 VIEW Result Date: 01/01/2023 CLINICAL DATA:  Shortness of breath EXAM: PORTABLE CHEST 1 VIEW COMPARISON:  12/30/2022 FINDINGS: Hyperinflation compatible with COPD. Heart and mediastinal contours within normal limits. No acute confluent airspace opacities or effusions. No acute bony abnormality. IMPRESSION: Emphysema.  No active disease. Electronically Signed   By: Franky Crease M.D.   On: 01/01/2023 15:12   Scheduled Meds:  atorvastatin   20 mg Oral  QHS   bethanechol   5 mg Oral BID   budesonide  (PULMICORT ) nebulizer solution  0.25 mg Nebulization BID   Chlorhexidine  Gluconate Cloth  6 each Topical Daily   dextromethorphan -guaiFENesin   1 tablet Oral BID   doxycycline   100 mg Oral Q12H   escitalopram   10 mg Oral Daily  folic acid   1 mg Oral Daily   insulin  aspart  0-9 Units Subcutaneous Q4H   levalbuterol   0.63 mg Nebulization Q6H   levothyroxine   200 mcg Oral Q0600   loratadine   10 mg Oral Daily   methylPREDNISolone  (SOLU-MEDROL ) injection  40 mg Intravenous Q12H   metoprolol  tartrate  5 mg Intravenous Q6H   metoprolol  tartrate  12.5 mg Oral BID   pantoprazole   40 mg Oral Daily   rivaroxaban   20 mg Oral Q supper   topiramate   100 mg Oral BID   Continuous Infusions:   LOS: 3 days   Rendall Carwin M.D on 01/02/2023 at 5:17 PM  Go to [email or web address] - for contact info  [facility] - Office  [phone]  If 7PM-7AM, please contact night-coverage [email or web address] 01/02/2023, 5:17 PM

## 2023-01-02 NOTE — Plan of Care (Signed)
  Problem: Clinical Measurements: Goal: Ability to maintain clinical measurements within normal limits will improve Outcome: Progressing Goal: Will remain free from infection Outcome: Progressing   Problem: Nutrition: Goal: Adequate nutrition will be maintained Outcome: Progressing   Problem: Coping: Goal: Level of anxiety will decrease Outcome: Progressing   Problem: Elimination: Goal: Will not experience complications related to urinary retention Outcome: Progressing   Problem: Pain Management: Goal: General experience of comfort will improve Outcome: Progressing   Problem: Safety: Goal: Ability to remain free from injury will improve Outcome: Progressing   Problem: Skin Integrity: Goal: Risk for impaired skin integrity will decrease Outcome: Progressing   Problem: Metabolic: Goal: Ability to maintain appropriate glucose levels will improve Outcome: Progressing   Problem: Skin Integrity: Goal: Risk for impaired skin integrity will decrease Outcome: Progressing

## 2023-01-02 NOTE — Progress Notes (Signed)
 Patient is currently off Bipap and on 6L with sat of 94%.

## 2023-01-03 DIAGNOSIS — J9601 Acute respiratory failure with hypoxia: Secondary | ICD-10-CM | POA: Diagnosis not present

## 2023-01-03 LAB — GLUCOSE, CAPILLARY
Glucose-Capillary: 113 mg/dL — ABNORMAL HIGH (ref 70–99)
Glucose-Capillary: 118 mg/dL — ABNORMAL HIGH (ref 70–99)
Glucose-Capillary: 120 mg/dL — ABNORMAL HIGH (ref 70–99)
Glucose-Capillary: 130 mg/dL — ABNORMAL HIGH (ref 70–99)
Glucose-Capillary: 79 mg/dL (ref 70–99)
Glucose-Capillary: 92 mg/dL (ref 70–99)
Glucose-Capillary: 96 mg/dL (ref 70–99)

## 2023-01-03 NOTE — Progress Notes (Signed)
 Placed patient on high-flo O2 via nasal cannula from Bipap at 15L then progressively decreased patient down to 3 due to O2 sats were at 100%. Patient appears to be sleeping and mouth breathing. O2 sat dropped in the 70's. Increased O2 back up to 15L but O2 did not recover above 86% so Bipap placed back on patient. Patient alert and understood what was going on. Dr Rendall aware. Patient now appears comfortable, resting with O2 sats >88% on Bipap.

## 2023-01-03 NOTE — Progress Notes (Signed)
 PROGRESS NOTE  Terri Casey, is a 63 y.o. female, DOB - 03-Jun-1960, FMW:[id number]  Admit date - 12/30/2022   Admitting Physician Terri JONETTA Fairly, DO  Outpatient Primary MD for the patient is Terri Rayfield RAMAN, NP  LOS - 4  Chief Complaint  Patient presents with   Shortness of Breath      Brief Narrative:  -63 y.o. female with medical history significant for COPD with chronic hypoxemia 3 L nasal cannula, PAF on Xarelto , chronic diastolic CHF, hypothyroidism, and depression admitted on 12/30/2022 with acute on chronic hypoxic and hypercapnic respiratory failure in the setting of COPD exacerbation --Covid  Negative --Dyspnea and hypoxia persist--continues to require BiPAP on and off   -Assessment and Plan: 1)Acute on chronic hypoxic and hypercapnic respiratory failure secondary to COPD exacerbation- -COVID-negative 01/03/23 -Dyspnea and hypoxia persist--continues to require BiPAP on and off -Repeat ABG on 01/01/2023 with pH of 7.24 pCO2 of 100, pO2 of 50 bicarb of 44, acid base excess of 12 -More awake after BiPAP use -Continue IV Solu-Medrol , oral doxycycline , -Continue mucolytic's and bronchodilators -c/n BiPAP alternating with nasal cannula oxygen  2)PAFib--- stable at this time, actually appears to be in sinus rhythm  -continue metoprolol  for rate control -Continue Xarelto  for stroke prophylaxis  3)Chronic diastolic dysfunction CHF--echo from 09/21/2018 with EF of 60 to 65% with grade 1 diastolic dysfunction, mild LVH -- Continue metoprolol , -Appears euvolemic, no indication for diuretics at this time  4)Hypothyroidism--- continue Levothyroxine   5)GERD--- continue Protonix  especially while on steroids  6)Depression/Anxiety--- stable, continue Lexapro   7)Tobacco Abuse----smoking cessation advised, patient declines nicotine  patch  Status is: Inpatient   Disposition: The patient is from: Home              Anticipated d/c is to: Home with [facility]              Anticipated d/c  date is: 3 days              Patient currently is not medically stable to d/c. Barriers: Not Clinically Stable-   Code Status : -  Code Status: Limited: Do not attempt resuscitation (DNR) -DNR-LIMITED -Do Not Intubate/DNI    Family Communication:    NA (patient is alert, awake and coherent)   DVT Prophylaxis  :   - SCDs  rivaroxaban  (XARELTO ) tablet 20 mg   Lab Results  Component Value Date   PLT 85 (L) 01/02/2023   Inpatient Medications  Scheduled Meds:  atorvastatin   20 mg Oral QHS   bethanechol   5 mg Oral BID   budesonide  (PULMICORT ) nebulizer solution  0.25 mg Nebulization BID   Chlorhexidine  Gluconate Cloth  6 each Topical Daily   dextromethorphan -guaiFENesin   1 tablet Oral BID   doxycycline   100 mg Oral Q12H   escitalopram   10 mg Oral Daily   folic acid   1 mg Oral Daily   insulin  aspart  0-9 Units Subcutaneous Q4H   levalbuterol   0.63 mg Nebulization Q6H   levothyroxine   200 mcg Oral Q0600   loratadine   10 mg Oral Daily   methylPREDNISolone  (SOLU-MEDROL ) injection  40 mg Intravenous Q12H   metoprolol  tartrate  12.5 mg Oral BID   pantoprazole   40 mg Oral Daily   rivaroxaban   20 mg Oral Q supper   topiramate   100 mg Oral BID   Continuous Infusions:  PRN Meds:.acetaminophen  **OR** acetaminophen , cyclobenzaprine , LORazepam , ondansetron  **OR** ondansetron  (ZOFRAN ) IV, SUMAtriptan   Anti-infectives (From admission, onward)    Start     Dose/Rate Route Frequency  Ordered Stop   12/31/22 2000  doxycycline  (VIBRA -TABS) tablet 100 mg        100 mg Oral Every 12 hours 12/31/22 1012 01/05/23 2159   12/30/22 1200  doxycycline  (VIBRAMYCIN ) 100 mg in dextrose  5 % 250 mL IVPB        100 mg 125 mL/hr over 120 Minutes Intravenous 2 times daily 12/30/22 1052 01/01/23 0836       Subjective: Terri Casey today has no fevers, no emesis,  No chest pain,  -Felt better after being on BiPAP for extended period of time -Currently weaned back to nasal cannula --Dyspnea and hypoxia  persist--continues to require BiPAP on and off  Objective: Vitals:   01/03/23 0800 01/03/23 0900 01/03/23 0919 01/03/23 1000  BP: (!) 131/58 (!) 121/59 (!) 121/59   Pulse: 80 83 88   Resp: 16 17    Temp:      TempSrc:      SpO2: 95% 95% 91% 99%  Weight:      Height:        Intake/Output Summary (Last 24 hours) at 01/03/2023 1016 Last data filed at 01/03/2023 0500 Gross per 24 hour  Intake 120 ml  Output 900 ml  Net -780 ml   Filed Weights   12/30/22 0603 12/30/22 1146  Weight: 99 kg 84.5 kg   Physical Exam Gen:-Less sleepy, more arousable, in no acute distress HEENT:- [place].AT, No sclera icterus Nose---BiPAP mask alternating with nasal cannula Neck-Supple Neck,No JVD,.  Lungs-decreased air movement, no significant wheezing CV- S1, S2 normal, regular  Abd-  +ve B.Sounds, Abd Soft, No tenderness,    Extremity/Skin:- No  edema, pedal pulses present  Psych-affect is flat ,  oriented x3 Neuro-generalized weakness, no new focal deficits, no tremors  Data Reviewed: I have personally reviewed following labs and imaging studies  CBC: Recent Labs  Lab 12/30/22 0555 12/31/22 0537 01/02/23 0531  WBC 8.8 3.7* 4.1  NEUTROABS 7.7  --   --   HGB 12.5 10.2* 10.9*  HCT 44.9 35.7* 38.4  MCV 110.3* 103.2* 107.0*  PLT 101* 99* 85*   Basic Metabolic Panel: Recent Labs  Lab 12/30/22 0555 12/31/22 0410 01/02/23 0531  NA 141 139 140  K 3.7 4.5 4.5  CL 93* 94* 93*  CO2 37* 35* 39*  GLUCOSE 134* 113* 112*  BUN 14 23 25*  CREATININE 0.58 0.55 0.53  CALCIUM  9.6 9.7 9.3  MG  --  2.1  --    GFR: Estimated Creatinine Clearance: 82.4 mL/min (by C-G formula based on SCr of 0.53 mg/dL).  Recent Results (from the past 240 hours)  MRSA Next Gen by PCR, Nasal     Status: None   Collection Time: 12/30/22 10:46 AM   Specimen: Nasal Mucosa; Nasal Swab  Result Value Ref Range Status   MRSA by PCR Next Gen NOT DETECTED NOT DETECTED Final    Comment: (NOTE) The GeneXpert MRSA Assay (FDA  approved for NASAL specimens only), is one component of a comprehensive MRSA colonization surveillance program. It is not intended to diagnose MRSA infection nor to guide or monitor treatment for MRSA infections. Test performance is not FDA approved in patients less than 58 years old. Performed at [facility], [address] [place], [place] [address]   SARS Coronavirus 2 by RT PCR (hospital order, performed in [facility] hospital lab) *cepheid single result test* Anterior Nasal Swab     Status: None   Collection Time: 12/30/22 10:46 AM   Specimen: Anterior  Nasal Swab  Result Value Ref Range Status   SARS Coronavirus 2 by RT PCR NEGATIVE NEGATIVE Final    Comment: (NOTE) SARS-CoV-2 target nucleic acids are NOT DETECTED.  The SARS-CoV-2 RNA is generally detectable in upper and lower respiratory specimens during the acute phase of infection. The lowest concentration of SARS-CoV-2 viral copies this assay can detect is 250 copies / mL. A negative result does not preclude SARS-CoV-2 infection and should not be used as the sole basis for treatment or other patient management decisions.  A negative result may occur with improper specimen collection / handling, submission of specimen other than nasopharyngeal swab, presence of viral mutation(s) within the areas targeted by this assay, and inadequate number of viral copies (<250 copies / mL). A negative result must be combined with clinical observations, patient history, and epidemiological information.  Fact Sheet for Patients:   roadlaptop.co.za  Fact Sheet for Healthcare Providers: http://kim-miller.com/  This test is not yet approved or  cleared by the [place]  FDA and has been authorized for detection and/or diagnosis of SARS-CoV-2 by FDA under an Emergency Use Authorization (EUA).  This EUA will remain in effect (meaning this test can be used) for the duration of the COVID-19  declaration under Section 564(b)(1) of the Act, 21 U.S.C. section 360bbb-3(b)(1), unless the authorization is terminated or revoked sooner.  Performed at [facility], [address], [place], [place] [address]   Respiratory (~20 pathogens) panel by PCR     Status: None   Collection Time: 12/30/22 10:51 AM   Specimen: Nasopharyngeal Swab; Respiratory  Result Value Ref Range Status   Adenovirus NOT DETECTED NOT DETECTED Final   Coronavirus 229E NOT DETECTED NOT DETECTED Final    Comment: (NOTE) The Coronavirus on the Respiratory Panel, DOES NOT test for the novel  Coronavirus (2019 nCoV)    Coronavirus HKU1 NOT DETECTED NOT DETECTED Final   Coronavirus NL63 NOT DETECTED NOT DETECTED Final   Coronavirus OC43 NOT DETECTED NOT DETECTED Final   Metapneumovirus NOT DETECTED NOT DETECTED Final   Rhinovirus / Enterovirus NOT DETECTED NOT DETECTED Final   Influenza A NOT DETECTED NOT DETECTED Final   Influenza B NOT DETECTED NOT DETECTED Final   Parainfluenza Virus 1 NOT DETECTED NOT DETECTED Final   Parainfluenza Virus 2 NOT DETECTED NOT DETECTED Final   Parainfluenza Virus 3 NOT DETECTED NOT DETECTED Final   Parainfluenza Virus 4 NOT DETECTED NOT DETECTED Final   Respiratory Syncytial Virus NOT DETECTED NOT DETECTED Final   Bordetella pertussis NOT DETECTED NOT DETECTED Final   Bordetella Parapertussis NOT DETECTED NOT DETECTED Final   Chlamydophila pneumoniae NOT DETECTED NOT DETECTED Final   Mycoplasma pneumoniae NOT DETECTED NOT DETECTED Final    Comment: Performed at [facility], [address] [place], [place] [address]    Radiology Studies: DG CHEST PORT 1 VIEW Result Date: 01/01/2023 CLINICAL DATA:  Shortness of breath EXAM: PORTABLE CHEST 1 VIEW COMPARISON:  12/30/2022 FINDINGS: Hyperinflation compatible with COPD. Heart and mediastinal contours within normal limits. No acute confluent airspace opacities or effusions. No acute bony abnormality. IMPRESSION: Emphysema.  No  active disease. Electronically Signed   By: Franky Crease M.D.   On: 01/01/2023 15:12   Scheduled Meds:  atorvastatin   20 mg Oral QHS   bethanechol   5 mg Oral BID   budesonide  (PULMICORT ) nebulizer solution  0.25 mg Nebulization BID   Chlorhexidine  Gluconate Cloth  6 each Topical Daily   dextromethorphan -guaiFENesin   1 tablet Oral BID  doxycycline   100 mg Oral Q12H   escitalopram   10 mg Oral Daily   folic acid   1 mg Oral Daily   insulin  aspart  0-9 Units Subcutaneous Q4H   levalbuterol   0.63 mg Nebulization Q6H   levothyroxine   200 mcg Oral Q0600   loratadine   10 mg Oral Daily   methylPREDNISolone  (SOLU-MEDROL ) injection  40 mg Intravenous Q12H   metoprolol  tartrate  12.5 mg Oral BID   pantoprazole   40 mg Oral Daily   rivaroxaban   20 mg Oral Q supper   topiramate   100 mg Oral BID   Continuous Infusions:   LOS: 4 days   Rendall Carwin M.D on 01/03/2023 at 10:16 AM  Go to [email or web address] - for contact info  [facility] - Office  [phone]  If 7PM-7AM, please contact night-coverage [email or web address] 01/03/2023, 10:16 AM

## 2023-01-03 NOTE — Plan of Care (Signed)

## 2023-01-04 ENCOUNTER — Inpatient Hospital Stay (HOSPITAL_COMMUNITY): Payer: Medicaid Other

## 2023-01-04 DIAGNOSIS — J9601 Acute respiratory failure with hypoxia: Secondary | ICD-10-CM | POA: Diagnosis not present

## 2023-01-04 LAB — BLOOD GAS, ARTERIAL
Acid-Base Excess: 21.5 mmol/L — ABNORMAL HIGH (ref 0.0–2.0)
Acid-Base Excess: 25.9 mmol/L — ABNORMAL HIGH (ref 0.0–2.0)
Bicarbonate: 53.3 mmol/L — ABNORMAL HIGH (ref 20.0–28.0)
Bicarbonate: 55.7 mmol/L — ABNORMAL HIGH (ref 20.0–28.0)
Drawn by: 35043
Drawn by: 23430
O2 Saturation: 94.9 %
O2 Saturation: 80.3 %
Patient temperature: 36.8
Patient temperature: 37.1
pCO2 arterial: 100 mm[Hg] (ref 32–48)
pCO2 arterial: 92 mm[Hg] (ref 32–48)
pH, Arterial: 7.33 — ABNORMAL LOW (ref 7.35–7.45)
pH, Arterial: 7.39 (ref 7.35–7.45)
pO2, Arterial: 63 mm[Hg] — ABNORMAL LOW (ref 83–108)
pO2, Arterial: 42 mm[Hg] — ABNORMAL LOW (ref 83–108)

## 2023-01-04 LAB — GLUCOSE, CAPILLARY
Glucose-Capillary: 86 mg/dL (ref 70–99)
Glucose-Capillary: 99 mg/dL (ref 70–99)
Glucose-Capillary: 96 mg/dL (ref 70–99)
Glucose-Capillary: 103 mg/dL — ABNORMAL HIGH (ref 70–99)
Glucose-Capillary: 117 mg/dL — ABNORMAL HIGH (ref 70–99)

## 2023-01-04 MED ORDER — METHYLPREDNISOLONE SODIUM SUCC 125 MG IJ SOLR
80.0000 mg | Freq: Two times a day (BID) | INTRAMUSCULAR | Status: DC
  Administered 2023-01-04 – 2023-01-05 (×2): 80 mg via INTRAVENOUS
  Filled 2023-01-04 (×2): qty 2

## 2023-01-04 MED ORDER — POLYETHYLENE GLYCOL 3350 17 G PO PACK
17.0000 g | PACK | Freq: Every day | ORAL | Status: DC
  Administered 2023-01-04 – 2023-01-07 (×4): 17 g via ORAL
  Filled 2023-01-04 (×4): qty 1

## 2023-01-04 MED ORDER — SENNOSIDES-DOCUSATE SODIUM 8.6-50 MG PO TABS
1.0000 | ORAL_TABLET | Freq: Two times a day (BID) | ORAL | Status: DC
  Administered 2023-01-04 – 2023-01-07 (×7): 1 via ORAL
  Filled 2023-01-04 (×7): qty 1

## 2023-01-04 MED ORDER — BUDESONIDE 0.5 MG/2ML IN SUSP
0.5000 mg | Freq: Two times a day (BID) | RESPIRATORY_TRACT | Status: DC
  Administered 2023-01-04 – 2023-01-07 (×6): 0.5 mg via RESPIRATORY_TRACT
  Filled 2023-01-04 (×6): qty 2

## 2023-01-04 NOTE — Plan of Care (Signed)

## 2023-01-04 NOTE — Progress Notes (Signed)
 Date and time results received: 01/04/23 0513  Test: PCO2 Critical Value: 100  Name of Provider Notified: Dr. Lazarus Salines  01/04/2023 at [phone]

## 2023-01-04 NOTE — Progress Notes (Addendum)
 PROGRESS NOTE  Terri Casey, is a 63 y.o. female, DOB - 1960/12/10, FMW:[id number]  Admit date - 12/30/2022   Admitting Physician Pratik JONETTA Fairly, DO  Outpatient Primary MD for the patient is Joshua Rayfield RAMAN, NP  LOS - 5  Chief Complaint  Patient presents with   Shortness of Breath      Brief Narrative:  -63 y.o. female with medical history significant for COPD with chronic hypoxemia 3 L nasal cannula, PAF on Xarelto , chronic diastolic CHF, hypothyroidism, and depression admitted on 12/30/2022 with acute on chronic hypoxic and hypercapnic respiratory failure in the setting of COPD exacerbation --Covid  Negative --Dyspnea and hypoxia persist--continues to require BiPAP on and off     Subjective: Terri Casey was seen and examined this morning, awake alert,, on BiPAP Per nursing staff off BiPAP patient desatted overnight ABG revealed pCO2 100 overnight patient was placed back on BiPAP    -Assessment and Plan: 1)Acute on chronic hypoxic and hypercapnic respiratory failure secondary to COPD exacerbation- -COVID-negative 01/03/23 -Dyspnea and hypoxia persist--continues to require BiPAP on and off -Repeat ABG on 01/01/2023 with pH of 7.24 pCO2 of 100, pO2 of 50 bicarb of 44, acid base excess of 12 -More awake after BiPAP use -Continue IV Solu-Medrol , oral doxycycline , -Continue mucolytic's and bronchodilators -c/n BiPAP alternating with nasal cannula oxygen  01/04/2023 ABG    Component Value Date/Time   PHART 7.33 (L) 01/04/2023 0500   PCO2ART 100 (HH) 01/04/2023 0500   PO2ART 63 (L) 01/04/2023 0500   HCO3 53.3 (H) 01/04/2023 0500   TCO2 34 (H) 09/12/2021 1634   ACIDBASEDEF 7.7 (H) 08/09/2019 0748   O2SAT 94.9 01/04/2023 0500   Still hypercapnic Continue breathing treatments Discussed with respiratory, adjusting BiPAP   Patients condition quickly deteriorates without ventilator. Removal of the ventilator may cause serious harm to the patient, exacerbation of  condition and hospital readmission.Bilevel/RAD has been tried and failed to maintain or stabilize the patient. Bilevel cannot meet current volume requirements. Patient requires frequent durations of ventilatory support. Intermittent usage is insufficient.       2)PAFib--- stable at this time, actually appears to be in sinus rhythm  -continue metoprolol  and Xarelto    3)Chronic diastolic dysfunction CHF--echo from 09/21/2018 with EF of 60 to 65% with grade 1 diastolic dysfunction, mild LVH -- Continue metoprolol , -Appears euvolemic, -no indication for diuretics at this point  4)Hypothyroidism--- continue Levothyroxine   5)GERD--- continue Protonix  especially while on steroids  6)Depression/Anxiety--- stable, continue Lexapro   7)Tobacco Abuse----smoking cessation advised, patient declines nicotine  patch  Status is: Inpatient   Disposition: The patient is from: Home              Anticipated d/c is to: Home with [facility]              Anticipated d/c date is: 3 days              Patient currently is not medically stable to d/c. Barriers: Not Clinically Stable-   Code Status : -  Code Status: Limited: Do not attempt resuscitation (DNR) -DNR-LIMITED -Do Not Intubate/DNI    Family Communication:    NA (patient is alert, awake and coherent)   DVT Prophylaxis  :   - SCDs  rivaroxaban  (XARELTO ) tablet 20 mg   Lab Results  Component Value Date   PLT 85 (L) 01/02/2023   Inpatient Medications  Scheduled Meds:  atorvastatin   20 mg Oral QHS   bethanechol   5 mg Oral BID   budesonide  (PULMICORT ) nebulizer  solution  0.5 mg Nebulization BID   Chlorhexidine  Gluconate Cloth  6 each Topical Daily   dextromethorphan -guaiFENesin   1 tablet Oral BID   doxycycline   100 mg Oral Q12H   escitalopram   10 mg Oral Daily   folic acid   1 mg Oral Daily   insulin  aspart  0-9 Units Subcutaneous Q4H   levalbuterol   0.63 mg Nebulization Q6H   levothyroxine   200 mcg Oral Q0600   loratadine   10 mg Oral Daily    methylPREDNISolone  (SOLU-MEDROL ) injection  80 mg Intravenous Q12H   metoprolol  tartrate  12.5 mg Oral BID   pantoprazole   40 mg Oral Daily   rivaroxaban   20 mg Oral Q supper   topiramate   100 mg Oral BID   Continuous Infusions:  PRN Meds:.acetaminophen  **OR** acetaminophen , cyclobenzaprine , LORazepam , ondansetron  **OR** ondansetron  (ZOFRAN ) IV, SUMAtriptan   Anti-infectives (From admission, onward)    Start     Dose/Rate Route Frequency Ordered Stop   12/31/22 2000  doxycycline  (VIBRA -TABS) tablet 100 mg        100 mg Oral Every 12 hours 12/31/22 1012 01/05/23 2159   12/30/22 1200  doxycycline  (VIBRAMYCIN ) 100 mg in dextrose  5 % 250 mL IVPB        100 mg 125 mL/hr over 120 Minutes Intravenous 2 times daily 12/30/22 1052 01/01/23 0836      Objective: Vitals:   01/04/23 1120 01/04/23 1125 01/04/23 1130 01/04/23 1135  BP:      Pulse:      Resp:      Temp:      TempSrc:      SpO2: 96% 90% 95% 94%  Weight:      Height:        Intake/Output Summary (Last 24 hours) at 01/04/2023 1149 Last data filed at 01/04/2023 0610 Gross per 24 hour  Intake 240 ml  Output 650 ml  Net -410 ml   Filed Weights   12/30/22 0603 12/30/22 1146  Weight: 99 kg 84.5 kg        General:  AAO x 3, in respite distress on BiPAP  HEENT:  Normocephalic, PERRL, otherwise with in Normal limits   Neuro:  CNII-XII intact. , normal motor and sensation, reflexes intact   Lungs:   Positive air sounds diffusely-on BiPAP  Cardio:    S1/S2, RRR, No murmure, No Rubs or Gallops   Abdomen:  Soft, non-tender, bowel sounds active all four quadrants, no guarding or peritoneal signs.  Muscular  skeletal:  Limited exam -global generalized weaknesses - in bed, able to move all 4 extremities,   2+ pulses,  symmetric, No pitting edema  Skin:  Dry, warm to touch, negative for any Rashes,  Wounds: Please see nursing documentation          Data Reviewed: I have personally reviewed following labs and imaging  studies  CBC: Recent Labs  Lab 12/30/22 0555 12/31/22 0537 01/02/23 0531  WBC 8.8 3.7* 4.1  NEUTROABS 7.7  --   --   HGB 12.5 10.2* 10.9*  HCT 44.9 35.7* 38.4  MCV 110.3* 103.2* 107.0*  PLT 101* 99* 85*   Basic Metabolic Panel: Recent Labs  Lab 12/30/22 0555 12/31/22 0410 01/02/23 0531  NA 141 139 140  K 3.7 4.5 4.5  CL 93* 94* 93*  CO2 37* 35* 39*  GLUCOSE 134* 113* 112*  BUN 14 23 25*  CREATININE 0.58 0.55 0.53  CALCIUM  9.6 9.7 9.3  MG  --  2.1  --    GFR:  Estimated Creatinine Clearance: 82.4 mL/min (by C-G formula based on SCr of 0.53 mg/dL).  Recent Results (from the past 240 hours)  MRSA Next Gen by PCR, Nasal     Status: None   Collection Time: 12/30/22 10:46 AM   Specimen: Nasal Mucosa; Nasal Swab  Result Value Ref Range Status   MRSA by PCR Next Gen NOT DETECTED NOT DETECTED Final    Comment: (NOTE) The GeneXpert MRSA Assay (FDA approved for NASAL specimens only), is one component of a comprehensive MRSA colonization surveillance program. It is not intended to diagnose MRSA infection nor to guide or monitor treatment for MRSA infections. Test performance is not FDA approved in patients less than 76 years old. Performed at [facility], [address], [place], [place] [address]   SARS Coronavirus 2 by RT PCR (hospital order, performed in [facility] hospital lab) *cepheid single result test* Anterior Nasal Swab     Status: None   Collection Time: 12/30/22 10:46 AM   Specimen: Anterior Nasal Swab  Result Value Ref Range Status   SARS Coronavirus 2 by RT PCR NEGATIVE NEGATIVE Final    Comment: (NOTE) SARS-CoV-2 target nucleic acids are NOT DETECTED.  The SARS-CoV-2 RNA is generally detectable in upper and lower respiratory specimens during the acute phase of infection. The lowest concentration of SARS-CoV-2 viral copies this assay can detect is 250 copies / mL. A negative result does not preclude SARS-CoV-2 infection and should not be used as the sole  basis for treatment or other patient management decisions.  A negative result may occur with improper specimen collection / handling, submission of specimen other than nasopharyngeal swab, presence of viral mutation(s) within the areas targeted by this assay, and inadequate number of viral copies (<250 copies / mL). A negative result must be combined with clinical observations, patient history, and epidemiological information.  Fact Sheet for Patients:   roadlaptop.co.za  Fact Sheet for Healthcare Providers: http://kim-miller.com/  This test is not yet approved or  cleared by the [place]  FDA and has been authorized for detection and/or diagnosis of SARS-CoV-2 by FDA under an Emergency Use Authorization (EUA).  This EUA will remain in effect (meaning this test can be used) for the duration of the COVID-19 declaration under Section 564(b)(1) of the Act, 21 U.S.C. section 360bbb-3(b)(1), unless the authorization is terminated or revoked sooner.  Performed at [facility], [address], [place], [place] [address]   Respiratory (~20 pathogens) panel by PCR     Status: None   Collection Time: 12/30/22 10:51 AM   Specimen: Nasopharyngeal Swab; Respiratory  Result Value Ref Range Status   Adenovirus NOT DETECTED NOT DETECTED Final   Coronavirus 229E NOT DETECTED NOT DETECTED Final    Comment: (NOTE) The Coronavirus on the Respiratory Panel, DOES NOT test for the novel  Coronavirus (2019 nCoV)    Coronavirus HKU1 NOT DETECTED NOT DETECTED Final   Coronavirus NL63 NOT DETECTED NOT DETECTED Final   Coronavirus OC43 NOT DETECTED NOT DETECTED Final   Metapneumovirus NOT DETECTED NOT DETECTED Final   Rhinovirus / Enterovirus NOT DETECTED NOT DETECTED Final   Influenza A NOT DETECTED NOT DETECTED Final   Influenza B NOT DETECTED NOT DETECTED Final   Parainfluenza Virus 1 NOT DETECTED NOT DETECTED Final   Parainfluenza Virus 2 NOT DETECTED  NOT DETECTED Final   Parainfluenza Virus 3 NOT DETECTED NOT DETECTED Final   Parainfluenza Virus 4 NOT DETECTED NOT DETECTED Final   Respiratory Syncytial Virus NOT DETECTED NOT  DETECTED Final   Bordetella pertussis NOT DETECTED NOT DETECTED Final   Bordetella Parapertussis NOT DETECTED NOT DETECTED Final   Chlamydophila pneumoniae NOT DETECTED NOT DETECTED Final   Mycoplasma pneumoniae NOT DETECTED NOT DETECTED Final    Comment: Performed at [facility], [address], [place], [place] [address]    Radiology Studies: No results found.  Scheduled Meds:  atorvastatin   20 mg Oral QHS   bethanechol   5 mg Oral BID   budesonide  (PULMICORT ) nebulizer solution  0.5 mg Nebulization BID   Chlorhexidine  Gluconate Cloth  6 each Topical Daily   dextromethorphan -guaiFENesin   1 tablet Oral BID   doxycycline   100 mg Oral Q12H   escitalopram   10 mg Oral Daily   folic acid   1 mg Oral Daily   insulin  aspart  0-9 Units Subcutaneous Q4H   levalbuterol   0.63 mg Nebulization Q6H   levothyroxine   200 mcg Oral Q0600   loratadine   10 mg Oral Daily   methylPREDNISolone  (SOLU-MEDROL ) injection  80 mg Intravenous Q12H   metoprolol  tartrate  12.5 mg Oral BID   pantoprazole   40 mg Oral Daily   rivaroxaban   20 mg Oral Q supper   topiramate   100 mg Oral BID   Continuous Infusions:   LOS: 5 days   Adriana DELENA Grams M.D on 01/04/2023 at 11:49 AM  Critical care x 70 min -was spent in seeing evaluating patient, regarding plan of care, evaluating labs, patient status-discussed with ICU staff and respiratory status  Go to [email or web address] - for contact info  [facility] - Office  [phone]  If 7PM-7AM, please contact night-coverage [email or web address] 01/04/2023, 11:49 AM

## 2023-01-04 NOTE — Progress Notes (Signed)
 Recent ABG showing pH of 7.33 but PaCO2 of 100.  Patient is a retainer of CO2, but have increased IPAP and EPAP from 14/7 to 16/10 to see if this does any better for patient.  Patient seems to be tolerating at this time, but is resting.

## 2023-01-04 NOTE — Progress Notes (Signed)
 Date and time results received: 01/04/23 1203 (use smartphrase ".now" to insert current time)  Test: PCO2   Critical Value: 58  Name of Provider Notified: Dr Flossie Dibble  Orders Received? Or Actions Taken?: No new orders currently

## 2023-01-04 NOTE — TOC Progression Note (Signed)
 Transition of Care [facility]) - Progression Note    Patient Details  Name: Terri Casey MRN: [id number] Date of Birth: July 30, 1960  Transition of Care [facility]) CM/SW Contact  Noreen KATHEE [place], [place] Phone Number: 01/04/2023, 3:03 PM  Clinical Narrative:     CSW was consulted today for a NIV machine. CSW spoke with patient . Patient agreeable to adapt providing machine. CSW sent Mitch with adapt the requested signed documents and patient made aware that order has been placed. TOC signing off.     Barriers to Discharge: Continued Medical Work up  Expected Discharge Plan and Services      Return back home.     Social Determinants of Health (SDOH) Interventions SDOH Screenings   Food Insecurity: No Food Insecurity (12/30/2022)  Housing: Low Risk  (12/30/2022)  Transportation Needs: No Transportation Needs (12/30/2022)  Utilities: Not At Risk (12/30/2022)  Tobacco Use: High Risk (12/30/2022)    Readmission Risk Interventions    01/04/2023    3:02 PM 12/31/2022    3:45 PM 09/14/2021    3:21 PM  Readmission Risk Prevention Plan  Transportation Screening Complete Complete   PCP or Specialist Appt within 3-5 Days   Complete  Home Care Screening Complete Complete   Medication Review (RN CM) Complete Complete   HRI or Home Care Consult   Complete  Social Work Consult for Recovery Care Planning/Counseling   Complete  Palliative Care Screening   Not Applicable  Medication Review Ocean[employer])   Complete

## 2023-01-05 DIAGNOSIS — J9601 Acute respiratory failure with hypoxia: Secondary | ICD-10-CM | POA: Diagnosis not present

## 2023-01-05 LAB — BLOOD GAS, VENOUS
Acid-Base Excess: 23.1 mmol/L — ABNORMAL HIGH (ref 0.0–2.0)
Bicarbonate: 55 mmol/L — ABNORMAL HIGH (ref 20.0–28.0)
Drawn by: 44828
O2 Saturation: 83 %
Patient temperature: 36.8
pCO2, Ven: 101 mm[Hg] (ref 44–60)
pH, Ven: 7.34 (ref 7.25–7.43)
pO2, Ven: 47 mm[Hg] — ABNORMAL HIGH (ref 32–45)

## 2023-01-05 LAB — BASIC METABOLIC PANEL
Anion gap: 7 (ref 5–15)
BUN: 24 mg/dL — ABNORMAL HIGH (ref 8–23)
CO2: 43 mmol/L — ABNORMAL HIGH (ref 22–32)
Calcium: 9.4 mg/dL (ref 8.9–10.3)
Chloride: 92 mmol/L — ABNORMAL LOW (ref 98–111)
Creatinine, Ser: 0.49 mg/dL (ref 0.44–1.00)
GFR, Estimated: >60 mL/min (ref >60)
Glucose, Bld: 94 mg/dL (ref 70–99)
Potassium: 4.1 mmol/L (ref 3.5–5.1)
Sodium: 142 mmol/L (ref 135–145)

## 2023-01-05 LAB — CBC
HCT: 39.1 % (ref 36.0–46.0)
Hemoglobin: 10.8 g/dL — ABNORMAL LOW (ref 12.0–15.0)
MCH: 29.5 pg (ref 26.0–34.0)
MCHC: 27.6 g/dL — ABNORMAL LOW (ref 30.0–36.0)
MCV: 106.8 fL — ABNORMAL HIGH (ref 80.0–100.0)
Platelets: 90 10*3/uL — ABNORMAL LOW (ref 150–400)
RBC: 3.66 MIL/uL — ABNORMAL LOW (ref 3.87–5.11)
RDW: 13.2 % (ref 11.5–15.5)
WBC: 3.4 10*3/uL — ABNORMAL LOW (ref 4.0–10.5)
nRBC: 0 % (ref 0.0–0.2)

## 2023-01-05 LAB — GLUCOSE, CAPILLARY
Glucose-Capillary: 105 mg/dL — ABNORMAL HIGH (ref 70–99)
Glucose-Capillary: 108 mg/dL — ABNORMAL HIGH (ref 70–99)
Glucose-Capillary: 115 mg/dL — ABNORMAL HIGH (ref 70–99)
Glucose-Capillary: 120 mg/dL — ABNORMAL HIGH (ref 70–99)
Glucose-Capillary: 143 mg/dL — ABNORMAL HIGH (ref 70–99)
Glucose-Capillary: 86 mg/dL (ref 70–99)
Glucose-Capillary: 98 mg/dL (ref 70–99)

## 2023-01-05 MED ORDER — METHYLPREDNISOLONE SODIUM SUCC 125 MG IJ SOLR
60.0000 mg | Freq: Two times a day (BID) | INTRAMUSCULAR | Status: DC
  Administered 2023-01-05 – 2023-01-06 (×2): 60 mg via INTRAVENOUS
  Filled 2023-01-05 (×2): qty 2

## 2023-01-05 NOTE — Progress Notes (Signed)
 1610 Order for ABG postponed as nebulizer treatment had just been started at 0827. ABG to be obtained at a later time once oxygen level have had a chance to regulate. RN notified.

## 2023-01-05 NOTE — Progress Notes (Signed)
 PROGRESS NOTE  Terri Casey, is a 63 y.o. female, DOB - 26-Feb-1960, FMW:[id number]  Admit date - 12/30/2022   Admitting Physician Pratik JONETTA Fairly, DO  Outpatient Primary MD for the patient is Joshua Rayfield RAMAN, NP  LOS - 6  Chief Complaint  Patient presents with   Shortness of Breath      Brief Narrative:  Terri Casey is a  63 y.o. female with medical history significant for COPD with chronic hypoxemia 3 L nasal cannula, PAF on Xarelto , chronic diastolic CHF, hypothyroidism, and depression admitted on 12/30/2022 with acute on chronic hypoxic and hypercapnic respiratory failure in the setting of COPD exacerbation --Covid  Negative --Dyspnea and hypoxia persist--continues to require BiPAP on and off     Subjective: Terri Casey was seen and examined this morning, off BiPAP, On 4 L of oxygen, satting 94%    -Assessment and Plan: 1)Acute on chronic hypoxic and hypercapnic respiratory failure secondary to COPD exacerbation- -COVID-negative 01/03/23 -Dyspnea and hypoxia persist--continues to require BiPAP on and off -Repeat ABG on 01/01/2023 with pH of 7.24 pCO2 of 100, pO2 of 50 bicarb of 44, acid base excess of 12 -More awake after BiPAP use -Continue IV Solu-Medrol , oral doxycycline , -Continue mucolytic's and bronchodilators -c/n BiPAP alternating with nasal cannula oxygen  01/04/2023 ABG    Component Value Date/Time   PHART 7.39 01/04/2023 1140   PCO2ART 92 (HH) 01/04/2023 1140   PO2ART 42 (L) 01/04/2023 1140   HCO3 55.7 (H) 01/04/2023 1140   TCO2 34 (H) 09/12/2021 1634   ACIDBASEDEF 7.7 (H) 08/09/2019 0748   O2SAT 80.3 01/04/2023 1140   Still hypercapnic  01/05/2023 Respiratory was unable to obtain ABG this morning -Continue supplemental oxygen, BiPAP at nightly, and during the day Pending ABG or VBG to assess hypercapnia -Continue DuoNeb bronchodilator treatments    Patients condition quickly deteriorates without ventilator. Removal of the ventilator may  cause serious harm to the patient, exacerbation of condition and hospital readmission.Bilevel/RAD has been tried and failed to maintain or stabilize the patient. Bilevel cannot meet current volume requirements. Patient requires frequent durations of ventilatory support. Intermittent usage is insufficient.       2)PAFib--- stable at this time, actually appears to be in sinus rhythm  -continue metoprolol  and Xarelto    3)Chronic diastolic dysfunction CHF--echo from 09/21/2018 with EF of 60 to 65% with grade 1 diastolic dysfunction, mild LVH -- Continue metoprolol , -Appears euvolemic, -no indication for diuretics at this point  4)Hypothyroidism--- continue Levothyroxine   5)GERD--- continue Protonix  especially while on steroids  6)Depression/Anxiety--- stable, continue Lexapro   7)Tobacco Abuse----smoking cessation advised, patient declines nicotine  patch  Status is: Inpatient   Disposition: The patient is from: Home              Anticipated d/c is to: Home with [facility]              Anticipated d/c date is: 3 days              Patient currently is not medically stable to d/c. Barriers: Not Clinically Stable-   Code Status : -  Code Status: Limited: Do not attempt resuscitation (DNR) -DNR-LIMITED -Do Not Intubate/DNI    Family Communication:    NA (patient is alert, awake and coherent)   DVT Prophylaxis  :   - SCDs  rivaroxaban  (XARELTO ) tablet 20 mg   Lab Results  Component Value Date   PLT 90 (L) 01/05/2023   Inpatient Medications  Scheduled Meds:  atorvastatin   20 mg  Oral QHS   bethanechol   5 mg Oral BID   budesonide  (PULMICORT ) nebulizer solution  0.5 mg Nebulization BID   Chlorhexidine  Gluconate Cloth  6 each Topical Daily   dextromethorphan -guaiFENesin   1 tablet Oral BID   escitalopram   10 mg Oral Daily   folic acid   1 mg Oral Daily   insulin  aspart  0-9 Units Subcutaneous Q4H   levalbuterol   0.63 mg Nebulization Q6H   levothyroxine   200 mcg Oral Q0600   loratadine   10  mg Oral Daily   methylPREDNISolone  (SOLU-MEDROL ) injection  80 mg Intravenous Q12H   metoprolol  tartrate  12.5 mg Oral BID   pantoprazole   40 mg Oral Daily   polyethylene glycol  17 g Oral Daily   rivaroxaban   20 mg Oral Q supper   senna-docusate  1 tablet Oral BID   topiramate   100 mg Oral BID   Continuous Infusions:  PRN Meds:.acetaminophen  **OR** acetaminophen , cyclobenzaprine , LORazepam , ondansetron  **OR** ondansetron  (ZOFRAN ) IV, SUMAtriptan   Anti-infectives (From admission, onward)    Start     Dose/Rate Route Frequency Ordered Stop   12/31/22 2000  doxycycline  (VIBRA -TABS) tablet 100 mg        100 mg Oral Every 12 hours 12/31/22 1012 01/05/23 0909   12/30/22 1200  doxycycline  (VIBRAMYCIN ) 100 mg in dextrose  5 % 250 mL IVPB        100 mg 125 mL/hr over 120 Minutes Intravenous 2 times daily 12/30/22 1052 01/01/23 0836      Objective: Vitals:   01/05/23 1000 01/05/23 1100 01/05/23 1200 01/05/23 1236  BP: (!) 116/54 (!) 131/54 115/80   Pulse: 65 61 71 70  Resp: (!) 23 17 19  (!) 25  Temp:   99.1 F (37.3 C)   TempSrc:   Oral   SpO2: 98% 100% 100% 94%  Weight:      Height:        Intake/Output Summary (Last 24 hours) at 01/05/2023 1247 Last data filed at 01/05/2023 0920 Gross per 24 hour  Intake 240 ml  Output 1100 ml  Net -860 ml   Filed Weights   12/30/22 0603 12/30/22 1146  Weight: 99 kg 84.5 kg          General:  AAO x 3,  cooperative, improving shortness of breath, off BiPAP  HEENT:  Normocephalic, PERRL, otherwise with in Normal limits   Neuro:  CNII-XII intact. , normal motor and sensation, reflexes intact   Lungs:   Clear to auscultation BL, Respirations unlabored,  Improving but still present diffuse wheezing, rhonchi, Negative right lower lobe crackles  Cardio:    S1/S2, RRR, No murmure, No Rubs or Gallops   Abdomen:  Soft, non-tender, bowel sounds active all four quadrants, no guarding or peritoneal signs.  Muscular  skeletal:  Limited exam  -global generalized weaknesses - in bed, able to move all 4 extremities,   2+ pulses,  symmetric, No pitting edema  Skin:  Dry, warm to touch, negative for any Rashes,  Wounds: Please see nursing documentation           Data Reviewed: I have personally reviewed following labs and imaging studies  CBC: Recent Labs  Lab 12/30/22 0555 12/31/22 0537 01/02/23 0531 01/05/23 0533  WBC 8.8 3.7* 4.1 3.4*  NEUTROABS 7.7  --   --   --   HGB 12.5 10.2* 10.9* 10.8*  HCT 44.9 35.7* 38.4 39.1  MCV 110.3* 103.2* 107.0* 106.8*  PLT 101* 99* 85* 90*   Basic Metabolic Panel: Recent  Labs  Lab 12/30/22 0555 12/31/22 0410 01/02/23 0531 01/05/23 0533  NA 141 139 140 142  K 3.7 4.5 4.5 4.1  CL 93* 94* 93* 92*  CO2 37* 35* 39* 43*  GLUCOSE 134* 113* 112* 94  BUN 14 23 25* 24*  CREATININE 0.58 0.55 0.53 0.49  CALCIUM  9.6 9.7 9.3 9.4  MG  --  2.1  --   --    GFR: Estimated Creatinine Clearance: 82.4 mL/min (by C-G formula based on SCr of 0.49 mg/dL).  Recent Results (from the past 240 hours)  MRSA Next Gen by PCR, Nasal     Status: None   Collection Time: 12/30/22 10:46 AM   Specimen: Nasal Mucosa; Nasal Swab  Result Value Ref Range Status   MRSA by PCR Next Gen NOT DETECTED NOT DETECTED Final    Comment: (NOTE) The GeneXpert MRSA Assay (FDA approved for NASAL specimens only), is one component of a comprehensive MRSA colonization surveillance program. It is not intended to diagnose MRSA infection nor to guide or monitor treatment for MRSA infections. Test performance is not FDA approved in patients less than 84 years old. Performed at [facility], [address], [place], [place] [address]   SARS Coronavirus 2 by RT PCR (hospital order, performed in [facility] hospital lab) *cepheid single result test* Anterior Nasal Swab     Status: None   Collection Time: 12/30/22 10:46 AM   Specimen: Anterior Nasal Swab  Result Value Ref Range Status   SARS Coronavirus 2 by RT PCR NEGATIVE  NEGATIVE Final    Comment: (NOTE) SARS-CoV-2 target nucleic acids are NOT DETECTED.  The SARS-CoV-2 RNA is generally detectable in upper and lower respiratory specimens during the acute phase of infection. The lowest concentration of SARS-CoV-2 viral copies this assay can detect is 250 copies / mL. A negative result does not preclude SARS-CoV-2 infection and should not be used as the sole basis for treatment or other patient management decisions.  A negative result may occur with improper specimen collection / handling, submission of specimen other than nasopharyngeal swab, presence of viral mutation(s) within the areas targeted by this assay, and inadequate number of viral copies (<250 copies / mL). A negative result must be combined with clinical observations, patient history, and epidemiological information.  Fact Sheet for Patients:   roadlaptop.co.za  Fact Sheet for Healthcare Providers: http://kim-miller.com/  This test is not yet approved or  cleared by the [place]  FDA and has been authorized for detection and/or diagnosis of SARS-CoV-2 by FDA under an Emergency Use Authorization (EUA).  This EUA will remain in effect (meaning this test can be used) for the duration of the COVID-19 declaration under Section 564(b)(1) of the Act, 21 U.S.C. section 360bbb-3(b)(1), unless the authorization is terminated or revoked sooner.  Performed at [facility], [address], [place], [place] [address]   Respiratory (~20 pathogens) panel by PCR     Status: None   Collection Time: 12/30/22 10:51 AM   Specimen: Nasopharyngeal Swab; Respiratory  Result Value Ref Range Status   Adenovirus NOT DETECTED NOT DETECTED Final   Coronavirus 229E NOT DETECTED NOT DETECTED Final    Comment: (NOTE) The Coronavirus on the Respiratory Panel, DOES NOT test for the novel  Coronavirus (2019 nCoV)    Coronavirus HKU1 NOT DETECTED NOT DETECTED Final    Coronavirus NL63 NOT DETECTED NOT DETECTED Final   Coronavirus OC43 NOT DETECTED NOT DETECTED Final   Metapneumovirus NOT DETECTED NOT DETECTED Final  Rhinovirus / Enterovirus NOT DETECTED NOT DETECTED Final   Influenza A NOT DETECTED NOT DETECTED Final   Influenza B NOT DETECTED NOT DETECTED Final   Parainfluenza Virus 1 NOT DETECTED NOT DETECTED Final   Parainfluenza Virus 2 NOT DETECTED NOT DETECTED Final   Parainfluenza Virus 3 NOT DETECTED NOT DETECTED Final   Parainfluenza Virus 4 NOT DETECTED NOT DETECTED Final   Respiratory Syncytial Virus NOT DETECTED NOT DETECTED Final   Bordetella pertussis NOT DETECTED NOT DETECTED Final   Bordetella Parapertussis NOT DETECTED NOT DETECTED Final   Chlamydophila pneumoniae NOT DETECTED NOT DETECTED Final   Mycoplasma pneumoniae NOT DETECTED NOT DETECTED Final    Comment: Performed at [facility], [address], [place], [place] [address]    Radiology Studies: DG CHEST PORT 1 VIEW Result Date: 01/04/2023 CLINICAL DATA:  Shortness of breath. EXAM: PORTABLE CHEST 1 VIEW COMPARISON:  Radiographs 01/01/2023 and 12/30/2022.  CT 09/17/2021. FINDINGS: 0926 hours. Two views submitted. The heart size and mediastinal contours are stable. The lungs are hyperinflated with grossly stable left basilar scarring. No evidence of superimposed airspace disease, edema, pleural effusion or pneumothorax. The bones appear unchanged. Telemetry leads overlie the chest. IMPRESSION: Stable chest. No evidence of acute cardiopulmonary process. Chronic left basilar scarring. Electronically Signed   By: Elsie Perone M.D.   On: 01/04/2023 13:18    Scheduled Meds:  atorvastatin   20 mg Oral QHS   bethanechol   5 mg Oral BID   budesonide  (PULMICORT ) nebulizer solution  0.5 mg Nebulization BID   Chlorhexidine  Gluconate Cloth  6 each Topical Daily   dextromethorphan -guaiFENesin   1 tablet Oral BID   escitalopram   10 mg Oral Daily   folic acid   1 mg Oral Daily   insulin   aspart  0-9 Units Subcutaneous Q4H   levalbuterol   0.63 mg Nebulization Q6H   levothyroxine   200 mcg Oral Q0600   loratadine   10 mg Oral Daily   methylPREDNISolone  (SOLU-MEDROL ) injection  80 mg Intravenous Q12H   metoprolol  tartrate  12.5 mg Oral BID   pantoprazole   40 mg Oral Daily   polyethylene glycol  17 g Oral Daily   rivaroxaban   20 mg Oral Q supper   senna-docusate  1 tablet Oral BID   topiramate   100 mg Oral BID   Continuous Infusions:   LOS: 6 days   Adriana DELENA Grams M.D on 01/05/2023 at 12:47 PM  Critical care time 50 min -was spent in seeing evaluating patient, regarding plan of care, evaluating labs, patient status-discussed with ICU staff and respiratory status  Go to [email or web address] - for contact info  [facility] - Office  [phone]  If 7PM-7AM, please contact night-coverage [email or web address] 01/05/2023, 12:47 PM

## 2023-01-06 DIAGNOSIS — J9601 Acute respiratory failure with hypoxia: Secondary | ICD-10-CM | POA: Diagnosis not present

## 2023-01-06 LAB — GLUCOSE, CAPILLARY
Glucose-Capillary: 82 mg/dL (ref 70–99)
Glucose-Capillary: 115 mg/dL — ABNORMAL HIGH (ref 70–99)
Glucose-Capillary: 107 mg/dL — ABNORMAL HIGH (ref 70–99)
Glucose-Capillary: 99 mg/dL (ref 70–99)
Glucose-Capillary: 165 mg/dL — ABNORMAL HIGH (ref 70–99)

## 2023-01-06 LAB — BLOOD GAS, ARTERIAL
Acid-Base Excess: 19.2 mmol/L — ABNORMAL HIGH (ref 0.0–2.0)
Bicarbonate: 49.1 mmol/L — ABNORMAL HIGH (ref 20.0–28.0)
Drawn by: 22179
O2 Saturation: 95.7 %
Patient temperature: 36.7
pCO2 arterial: 82 mm[Hg] (ref 32–48)
pH, Arterial: 7.38 (ref 7.35–7.45)
pO2, Arterial: 69 mm[Hg] — ABNORMAL LOW (ref 83–108)

## 2023-01-06 LAB — BASIC METABOLIC PANEL
Anion gap: 7 (ref 5–15)
BUN: 26 mg/dL — ABNORMAL HIGH (ref 8–23)
CO2: 41 mmol/L — ABNORMAL HIGH (ref 22–32)
Calcium: 9.3 mg/dL (ref 8.9–10.3)
Chloride: 92 mmol/L — ABNORMAL LOW (ref 98–111)
Creatinine, Ser: 0.49 mg/dL (ref 0.44–1.00)
GFR, Estimated: >60 mL/min (ref >60)
Glucose, Bld: 87 mg/dL (ref 70–99)
Potassium: 3.7 mmol/L (ref 3.5–5.1)
Sodium: 140 mmol/L (ref 135–145)

## 2023-01-06 LAB — CBC
HCT: 35.2 % — ABNORMAL LOW (ref 36.0–46.0)
Hemoglobin: 10 g/dL — ABNORMAL LOW (ref 12.0–15.0)
MCH: 29.5 pg (ref 26.0–34.0)
MCHC: 28.4 g/dL — ABNORMAL LOW (ref 30.0–36.0)
MCV: 103.8 fL — ABNORMAL HIGH (ref 80.0–100.0)
Platelets: 106 10*3/uL — ABNORMAL LOW (ref 150–400)
RBC: 3.39 MIL/uL — ABNORMAL LOW (ref 3.87–5.11)
RDW: 13.2 % (ref 11.5–15.5)
WBC: 4.2 10*3/uL (ref 4.0–10.5)
nRBC: 0 % (ref 0.0–0.2)

## 2023-01-06 MED ORDER — INSULIN ASPART 100 UNIT/ML IJ SOLN: 0.0000 [IU] | Freq: Three times a day (TID) | INTRAMUSCULAR | Status: DC

## 2023-01-06 MED ORDER — SALINE SPRAY 0.65 % NA SOLN
1.0000 | NASAL | Status: DC | PRN
  Filled 2023-01-06: qty 44

## 2023-01-06 MED ORDER — METHYLPREDNISOLONE SODIUM SUCC 40 MG IJ SOLR
40.0000 mg | Freq: Two times a day (BID) | INTRAMUSCULAR | Status: DC
  Administered 2023-01-06 – 2023-01-07 (×2): 40 mg via INTRAVENOUS
  Filled 2023-01-06 (×2): qty 1

## 2023-01-06 NOTE — Plan of Care (Signed)

## 2023-01-06 NOTE — Progress Notes (Signed)
 PROGRESS NOTE  Terri Casey, is a 63 y.o. female, DOB - [birth year], FMW:[id number]  Admit date - 12/30/2022   Admitting Physician Pratik JONETTA Fairly, DO  Outpatient Primary MD for the patient is Joshua Rayfield RAMAN, NP  LOS - 7  Chief Complaint  Patient presents with   Shortness of Breath      Brief Narrative:  Terri Casey is a  63 y.o. female with medical history significant for COPD with chronic hypoxemia 3 L nasal cannula, PAF on Xarelto , chronic diastolic CHF, hypothyroidism, and depression admitted on 12/30/2022 with acute on chronic hypoxic and hypercapnic respiratory failure in the setting of COPD exacerbation --Covid  Negative --Dyspnea and hypoxia persist--continues to require BiPAP on and off     Subjective: Terri Casey was seen and examined this morning, laying comfortably in bed on BiPAP     -Assessment and Plan: 1)Acute on chronic hypoxic and hypercapnic respiratory failure secondary to COPD exacerbation- -COVID-negative 01/03/23 -Dyspnea and hypoxia persist--continues to require BiPAP on and off -Repeat ABG on 01/01/2023 with pH of 7.24 pCO2 of 100, pO2 of 50 bicarb of 44, acid base excess of 12 -More awake after BiPAP use -Continue IV Solu-Medrol , oral doxycycline , -Continue mucolytic's and bronchodilators -c/n BiPAP alternating with nasal cannula oxygen  01/04/2023 ABG    Component Value Date/Time   PHART 7.38 01/06/2023 0836   PCO2ART 82 (HH) 01/06/2023 0836   PO2ART 69 (L) 01/06/2023 0836   HCO3 49.1 (H) 01/06/2023 0836   TCO2 34 (H) 09/12/2021 1634   ACIDBASEDEF 7.7 (H) 08/09/2019 0748   O2SAT 95.7 01/06/2023 0836   Still hypercapnic  01/05/2023 Respiratory was unable to obtain ABG this morning -Continue supplemental oxygen, BiPAP at nightly, and during the day Pending ABG or VBG to assess hypercapnia -Continue DuoNeb bronchodilator treatments    Patients condition quickly deteriorates without ventilator. Removal of the ventilator may  cause serious harm to the patient, exacerbation of condition and hospital readmission.Bilevel/RAD has been tried and failed to maintain or stabilize the patient. Bilevel cannot meet current volume requirements. Patient requires frequent durations of ventilatory support. Intermittent usage is insufficient.    01/06/2023 -ABG reviewed, improved CO2 retention -Improved shortness of breath -Compliance on BiPAP-weaning off to O2 by nasal cannula -Patient will continue to need BiPAP nightly   2)PAFib-- -tachyarrhythmia likely due to shortness of breath, bronchodilators -continue metoprolol  and Xarelto    3)Chronic diastolic dysfunction CHF- -remains stable, euvolemic -echo from 09/21/2018 with EF of 60 to 65% with grade 1 diastolic dysfunction, mild LVH -- Continue metoprolol , -no indication for diuretics at this point  4)Hypothyroidism--- continue Levothyroxine  5)GERD--- continue Protonix  especially while on steroids 6)Depression/Anxiety--- stable, continue Lexapro  7)Tobacco Abuse----smoking cessation advised, patient declines nicotine  patch  Status is: Inpatient   Disposition: The patient is from: Home              Anticipated d/c is to: Home with [facility]              Anticipated d/c date is: 3 days              Patient currently is not medically stable to d/c. Deemed stable to be transferred out of ICU today   Barriers: Not Clinically Stable for discharge yet  Code Status : -  Code Status: Limited: Do not attempt resuscitation (DNR) -DNR-LIMITED -Do Not Intubate/DNI    Family Communication:    NA (patient is alert, awake and coherent)   DVT Prophylaxis  :   - SCDs  rivaroxaban  (XARELTO ) tablet 20 mg   Lab Results  Component Value Date   PLT 106 (L) 01/06/2023   Inpatient Medications  Scheduled Meds:  atorvastatin   20 mg Oral QHS   bethanechol   5 mg Oral BID   budesonide  (PULMICORT ) nebulizer solution  0.5 mg Nebulization BID   Chlorhexidine  Gluconate Cloth  6 each Topical  Daily   dextromethorphan -guaiFENesin   1 tablet Oral BID   escitalopram   10 mg Oral Daily   folic acid   1 mg Oral Daily   insulin  aspart  0-9 Units Subcutaneous Q4H   levalbuterol   0.63 mg Nebulization Q6H   levothyroxine   200 mcg Oral Q0600   loratadine   10 mg Oral Daily   methylPREDNISolone  (SOLU-MEDROL ) injection  40 mg Intravenous Q12H   metoprolol  tartrate  12.5 mg Oral BID   pantoprazole   40 mg Oral Daily   polyethylene glycol  17 g Oral Daily   rivaroxaban   20 mg Oral Q supper   senna-docusate  1 tablet Oral BID   topiramate   100 mg Oral BID   Continuous Infusions:  PRN Meds:.acetaminophen  **OR** acetaminophen , cyclobenzaprine , LORazepam , ondansetron  **OR** ondansetron  (ZOFRAN ) IV, SUMAtriptan   Anti-infectives (From admission, onward)    Start     Dose/Rate Route Frequency Ordered Stop   12/31/22 2000  doxycycline  (VIBRA -TABS) tablet 100 mg        100 mg Oral Every 12 hours 12/31/22 1012 01/05/23 0909   12/30/22 1200  doxycycline  (VIBRAMYCIN ) 100 mg in dextrose  5 % 250 mL IVPB        100 mg 125 mL/hr over 120 Minutes Intravenous 2 times daily 12/30/22 1052 01/01/23 0836      Objective: Vitals:   01/06/23 1000 01/06/23 1100 01/06/23 1136 01/06/23 1200  BP: (!) 102/56 (!) 117/44  102/63  Pulse: 67 64  (!) 57  Resp: 18 16  17   Temp:   98.4 F (36.9 C)   TempSrc:   Oral   SpO2: 100% 100%  100%  Weight:      Height:        Intake/Output Summary (Last 24 hours) at 01/06/2023 1412 Last data filed at 01/06/2023 0600 Gross per 24 hour  Intake 370 ml  Output 300 ml  Net 70 ml   Filed Weights   12/30/22 0603 12/30/22 1146  Weight: 99 kg 84.5 kg     General:  AAO x 3,  cooperative, moderate shortness of breath, on BiPAP  HEENT:  Normocephalic, PERRL, otherwise with in Normal limits   Neuro:  CNII-XII intact. , normal motor and sensation, reflexes intact   Lungs:   Improved wheezing, rales..  Positive breath sounds on BiPAP  Cardio:    S1/S2, RRR, No murmure, No  Rubs or Gallops   Abdomen:  Soft, non-tender, bowel sounds active all four quadrants, no guarding or peritoneal signs.  Muscular  skeletal:  Limited exam -global generalized weaknesses - in bed, able to move all 4 extremities,   2+ pulses,  symmetric, No pitting edema  Skin:  Dry, warm to touch, negative for any Rashes,  Wounds: Please see nursing documentation           Data Reviewed: I have personally reviewed following labs and imaging studies  CBC: Recent Labs  Lab 12/31/22 0537 01/02/23 0531 01/05/23 0533 01/06/23 0615  WBC 3.7* 4.1 3.4* 4.2  HGB 10.2* 10.9* 10.8* 10.0*  HCT 35.7* 38.4 39.1 35.2*  MCV 103.2* 107.0* 106.8* 103.8*  PLT 99* 85* 90* 106*   Basic  Metabolic Panel: Recent Labs  Lab 12/31/22 0410 01/02/23 0531 01/05/23 0533 01/06/23 0615  NA 139 140 142 140  K 4.5 4.5 4.1 3.7  CL 94* 93* 92* 92*  CO2 35* 39* 43* 41*  GLUCOSE 113* 112* 94 87  BUN 23 25* 24* 26*  CREATININE 0.55 0.53 0.49 0.49  CALCIUM  9.7 9.3 9.4 9.3  MG 2.1  --   --   --    GFR: Estimated Creatinine Clearance: 82.4 mL/min (by C-G formula based on SCr of 0.49 mg/dL).  Recent Results (from the past 240 hours)  MRSA Next Gen by PCR, Nasal     Status: None   Collection Time: 12/30/22 10:46 AM   Specimen: Nasal Mucosa; Nasal Swab  Result Value Ref Range Status   MRSA by PCR Next Gen NOT DETECTED NOT DETECTED Final    Comment: (NOTE) The GeneXpert MRSA Assay (FDA approved for NASAL specimens only), is one component of a comprehensive MRSA colonization surveillance program. It is not intended to diagnose MRSA infection nor to guide or monitor treatment for MRSA infections. Test performance is not FDA approved in patients less than 45 years old. Performed at [facility], [address], [place], [place] [address]   SARS Coronavirus 2 by RT PCR (hospital order, performed in [facility] hospital lab) *cepheid single result test* Anterior Nasal Swab     Status: None   Collection  Time: 12/30/22 10:46 AM   Specimen: Anterior Nasal Swab  Result Value Ref Range Status   SARS Coronavirus 2 by RT PCR NEGATIVE NEGATIVE Final    Comment: (NOTE) SARS-CoV-2 target nucleic acids are NOT DETECTED.  The SARS-CoV-2 RNA is generally detectable in upper and lower respiratory specimens during the acute phase of infection. The lowest concentration of SARS-CoV-2 viral copies this assay can detect is 250 copies / mL. A negative result does not preclude SARS-CoV-2 infection and should not be used as the sole basis for treatment or other patient management decisions.  A negative result may occur with improper specimen collection / handling, submission of specimen other than nasopharyngeal swab, presence of viral mutation(s) within the areas targeted by this assay, and inadequate number of viral copies (<250 copies / mL). A negative result must be combined with clinical observations, patient history, and epidemiological information.  Fact Sheet for Patients:   roadlaptop.co.za  Fact Sheet for Healthcare Providers: http://kim-miller.com/  This test is not yet approved or  cleared by the [place]  FDA and has been authorized for detection and/or diagnosis of SARS-CoV-2 by FDA under an Emergency Use Authorization (EUA).  This EUA will remain in effect (meaning this test can be used) for the duration of the COVID-19 declaration under Section 564(b)(1) of the Act, 21 U.S.C. section 360bbb-3(b)(1), unless the authorization is terminated or revoked sooner.  Performed at [facility], [address] [place], [place] [address]   Respiratory (~20 pathogens) panel by PCR     Status: None   Collection Time: 12/30/22 10:51 AM   Specimen: Nasopharyngeal Swab; Respiratory  Result Value Ref Range Status   Adenovirus NOT DETECTED NOT DETECTED Final   Coronavirus 229E NOT DETECTED NOT DETECTED Final    Comment: (NOTE) The Coronavirus on the  Respiratory Panel, DOES NOT test for the novel  Coronavirus (2019 nCoV)    Coronavirus HKU1 NOT DETECTED NOT DETECTED Final   Coronavirus NL63 NOT DETECTED NOT DETECTED Final   Coronavirus OC43 NOT DETECTED NOT DETECTED Final   Metapneumovirus NOT DETECTED NOT DETECTED  Final   Rhinovirus / Enterovirus NOT DETECTED NOT DETECTED Final   Influenza A NOT DETECTED NOT DETECTED Final   Influenza B NOT DETECTED NOT DETECTED Final   Parainfluenza Virus 1 NOT DETECTED NOT DETECTED Final   Parainfluenza Virus 2 NOT DETECTED NOT DETECTED Final   Parainfluenza Virus 3 NOT DETECTED NOT DETECTED Final   Parainfluenza Virus 4 NOT DETECTED NOT DETECTED Final   Respiratory Syncytial Virus NOT DETECTED NOT DETECTED Final   Bordetella pertussis NOT DETECTED NOT DETECTED Final   Bordetella Parapertussis NOT DETECTED NOT DETECTED Final   Chlamydophila pneumoniae NOT DETECTED NOT DETECTED Final   Mycoplasma pneumoniae NOT DETECTED NOT DETECTED Final    Comment: Performed at [facility], [address], [place], [place] [address]    Radiology Studies: No results found.   Scheduled Meds:  atorvastatin   20 mg Oral QHS   bethanechol   5 mg Oral BID   budesonide  (PULMICORT ) nebulizer solution  0.5 mg Nebulization BID   Chlorhexidine  Gluconate Cloth  6 each Topical Daily   dextromethorphan -guaiFENesin   1 tablet Oral BID   escitalopram   10 mg Oral Daily   folic acid   1 mg Oral Daily   insulin  aspart  0-9 Units Subcutaneous Q4H   levalbuterol   0.63 mg Nebulization Q6H   levothyroxine   200 mcg Oral Q0600   loratadine   10 mg Oral Daily   methylPREDNISolone  (SOLU-MEDROL ) injection  40 mg Intravenous Q12H   metoprolol  tartrate  12.5 mg Oral BID   pantoprazole   40 mg Oral Daily   polyethylene glycol  17 g Oral Daily   rivaroxaban   20 mg Oral Q supper   senna-docusate  1 tablet Oral BID   topiramate   100 mg Oral BID   Continuous Infusions:   LOS: 7 days   Terri Casey M.D on 01/06/2023 at 2:12  PM  Critical care time 50 min -was spent in seeing evaluating patient, regarding plan of care, evaluating labs, patient status-discussed with ICU staff and respiratory status  Go to [email or web address] - for contact info  [facility] - Office  [phone]  If 7PM-7AM, please contact night-coverage [email or web address] 01/06/2023, 2:12 PM

## 2023-01-07 DIAGNOSIS — J9601 Acute respiratory failure with hypoxia: Secondary | ICD-10-CM | POA: Diagnosis not present

## 2023-01-07 LAB — BLOOD GAS, ARTERIAL
Acid-Base Excess: 21.5 mmol/L — ABNORMAL HIGH (ref 0.0–2.0)
Bicarbonate: 50.3 mmol/L — ABNORMAL HIGH (ref 20.0–28.0)
Drawn by: 41977
O2 Saturation: 94.3 %
Patient temperature: 37
pCO2 arterial: 74 mm[Hg] (ref 32–48)
pH, Arterial: 7.44 (ref 7.35–7.45)
pO2, Arterial: 63 mm[Hg] — ABNORMAL LOW (ref 83–108)

## 2023-01-07 LAB — CBC
HCT: 34.9 % — ABNORMAL LOW (ref 36.0–46.0)
Hemoglobin: 10.4 g/dL — ABNORMAL LOW (ref 12.0–15.0)
MCH: 31 pg (ref 26.0–34.0)
MCHC: 29.8 g/dL — ABNORMAL LOW (ref 30.0–36.0)
MCV: 103.9 fL — ABNORMAL HIGH (ref 80.0–100.0)
Platelets: 114 10*3/uL — ABNORMAL LOW (ref 150–400)
RBC: 3.36 MIL/uL — ABNORMAL LOW (ref 3.87–5.11)
RDW: 13.6 % (ref 11.5–15.5)
WBC: 4.4 10*3/uL (ref 4.0–10.5)
nRBC: 0 % (ref 0.0–0.2)

## 2023-01-07 LAB — BASIC METABOLIC PANEL
Anion gap: 7 (ref 5–15)
BUN: 24 mg/dL — ABNORMAL HIGH (ref 8–23)
CO2: 38 mmol/L — ABNORMAL HIGH (ref 22–32)
Calcium: 9 mg/dL (ref 8.9–10.3)
Chloride: 94 mmol/L — ABNORMAL LOW (ref 98–111)
Creatinine, Ser: 0.48 mg/dL (ref 0.44–1.00)
GFR, Estimated: >60 mL/min (ref >60)
Glucose, Bld: 90 mg/dL (ref 70–99)
Potassium: 3.7 mmol/L (ref 3.5–5.1)
Sodium: 139 mmol/L (ref 135–145)

## 2023-01-07 LAB — GLUCOSE, CAPILLARY
Glucose-Capillary: 86 mg/dL (ref 70–99)
Glucose-Capillary: 106 mg/dL — ABNORMAL HIGH (ref 70–99)

## 2023-01-07 MED ORDER — METHYLPREDNISOLONE 4 MG PO TBPK: ORAL_TABLET | ORAL | 0 refills | Status: DC

## 2023-01-07 MED ORDER — ESCITALOPRAM OXALATE 10 MG PO TABS: 10.0000 mg | ORAL_TABLET | Freq: Every day | ORAL | 1 refills | Status: AC

## 2023-01-07 MED ORDER — DM-GUAIFENESIN ER 30-600 MG PO TB12: 1.0000 | ORAL_TABLET | Freq: Two times a day (BID) | ORAL | 0 refills | Status: DC

## 2023-01-07 MED ORDER — ATORVASTATIN CALCIUM 20 MG PO TABS: 20.0000 mg | ORAL_TABLET | Freq: Every day | ORAL | 1 refills | Status: AC

## 2023-01-07 NOTE — Discharge Summary (Signed)
 Physician Discharge Summary   Patient: Terri Casey MRN: [id number] DOB: 1960-11-19  Admit date:     12/30/2022  Discharge date: 01/07/23  Discharge Physician: Adriana DELENA Grams   PCP: Joshua Rayfield RAMAN, NP   Recommendations at discharge:  Follow-up with PCP and pulmonologist in 1-2 weeks -Continue your supplement oxygen -Use your trilogy machine nightly or with any shortness of breath symptoms Follow-up with pulmonologist as soon as possible Continue taper down steroids, DuoNeb or rescue inhalers- bronchodilators Q 4-6 hours  Discharge Diagnoses: Principal Problem:   Acute hypoxemic respiratory failure (HCC) Active Problems:   COPD exacerbation (HCC)   AF (paroxysmal atrial fibrillation) (HCC)   Essential hypertension, benign   Acute on chronic respiratory failure with hypoxia and hypercapnia (HCC)   Thrombocytopenia (HCC)   Hashimoto's thyroiditis   Generalized anxiety disorder   Depression   Terri Casey is a  63 y.o. female with medical history significant for COPD with chronic hypoxemia 3 L nasal cannula, PAF on Xarelto , chronic diastolic CHF, hypothyroidism, and depression admitted on 12/30/2022 with acute on chronic hypoxic and hypercapnic respiratory failure in the setting of COPD exacerbation --Covid  Negative --Dyspnea and hypoxia persist--continues to require BiPAP on and off      1)Acute on chronic hypoxic and hypercapnic respiratory failure secondary to COPD exacerbation- Overall improved -continue to need BiPAP, supplemental oxygen  -COVID-negative 01/03/23 -Dyspnea and hypoxia persist--continues to require BiPAP on and off -Repeat ABG on 01/01/2023 with pH of 7.24 pCO2 of 100, pO2 of 50 bicarb of 44, acid base excess of 12 -More awake after BiPAP use -Continue IV Solu-Medrol , oral doxycycline , -Continue mucolytic's and bronchodilators -c/n BiPAP alternating with nasal cannula oxygen       Component Value Date/Time   PHART 7.44 01/07/2023 0835    PCO2ART 74 (HH) 01/07/2023 0835   PO2ART 63 (L) 01/07/2023 0835   HCO3 50.3 (H) 01/07/2023 0835   TCO2 34 (H) 09/12/2021 1634   ACIDBASEDEF 7.7 (H) 08/09/2019 0748   O2SAT 94.3 01/07/2023 0835   Still hypercapnic   01/05/2023 Respiratory was unable to obtain ABG this morning -Continue supplemental oxygen, BiPAP at nightly, and during the day Pending ABG or VBG to assess hypercapnia -Continue DuoNeb bronchodilator treatments       Patients condition quickly deteriorates without ventilator. Removal of the ventilator may cause serious harm to the patient, exacerbation of condition and hospital readmission.Bilevel/RAD has been tried and failed to maintain or stabilize the patient. Bilevel cannot meet current volume requirements. Patient requires frequent durations of ventilatory support. Intermittent usage is insufficient.     Continue to trilogy machine/BiPAP nightly, and when having symptoms such as shortness of breath, hypoxia   01/06/2023 -ABG reviewed, improved CO2 retention -Improved shortness of breath -Compliance on BiPAP-weaning off to O2 by nasal cannula -Patient will continue to need BiPAP nightly  01/07/2024 Patient remained at baseline, needing BiPAP nightly, Improved shortness of breath and wheezing -Continue to improve CO2 retention   2)PAFib-- -tachyarrhythmia likely due to shortness of breath, bronchodilators -continue metoprolol  and Xarelto      3)Chronic diastolic dysfunction CHF- -remains stable, euvolemic -echo from 09/21/2018 with EF of 60 to 65% with grade 1 diastolic dysfunction, mild LVH -- Continue metoprolol ,    4)Hypothyroidism--- continue Levothyroxine  5)GERD--- continue Protonix  especially while on steroids 6)Depression/Anxiety--- stable, continue Lexapro  7)Tobacco Abuse----smoking cessation advised, patient declines nicotine  patch    Disposition: Home Diet recommendation:  Discharge Diet Orders (From admission, onward)     Start  Ordered    01/07/23 0000  Diet - low sodium heart healthy        01/07/23 1201           Regular diet DISCHARGE MEDICATION: Allergies as of 01/07/2023       Reactions   Estrogens Hives   Mustard Hives   Strawberry Extract Hives        Medication List     STOP taking these medications    predniSONE  10 MG tablet Commonly known as: DELTASONE        TAKE these medications    acetaminophen  500 MG tablet Commonly known as: TYLENOL  Take 500 mg by mouth every 6 (six) hours as needed for mild pain (pain score 1-3).   albuterol  108 (90 Base) MCG/ACT inhaler Commonly known as: VENTOLIN  HFA Inhale 3 puffs into the lungs every 6 (six) hours as needed for wheezing or shortness of breath.   albuterol  (2.5 MG/3ML) 0.083% nebulizer solution Commonly known as: PROVENTIL  Take 3 mLs (2.5 mg total) by nebulization every 6 (six) hours as needed for wheezing or shortness of breath.   atorvastatin  20 MG tablet Commonly known as: LIPITOR Take 1 tablet (20 mg total) by mouth at bedtime.   dextromethorphan -guaiFENesin  30-600 MG 12hr tablet Commonly known as: MUCINEX  DM Take 1 tablet by mouth 2 (two) times daily.   escitalopram  10 MG tablet Commonly known as: LEXAPRO  Take 1 tablet (10 mg total) by mouth daily.   fluticasone  110 MCG/ACT inhaler Commonly known as: FLOVENT  HFA Inhale 1 puff into the lungs daily as needed (wheezing, shortness of breath).   folic acid  1 MG tablet Commonly known as: FOLVITE  Take 1 tablet (1 mg total) by mouth daily.   levothyroxine  200 MCG tablet Commonly known as: SYNTHROID  Take 1 tablet (200 mcg total) by mouth daily at 6 (six) AM.   loratadine  10 MG tablet Commonly known as: CLARITIN  Take 10 mg by mouth at bedtime.   methylPREDNISolone  4 MG Tbpk tablet Commonly known as: MEDROL  DOSEPAK Medrol  Dosepak take as instructed   metoprolol  tartrate 25 MG tablet Commonly known as: LOPRESSOR  Take 12.5 mg by mouth 2 (two) times daily.   pantoprazole  40 MG  tablet Commonly known as: PROTONIX  Take 1 tablet (40 mg total) by mouth daily.   SUMAtriptan  50 MG tablet Commonly known as: IMITREX  Take 50 mg by mouth every 2 (two) hours as needed for migraine.   topiramate  100 MG tablet Commonly known as: TOPAMAX  Take 100 mg by mouth 2 (two) times daily.   Xarelto  20 MG Tabs tablet Generic drug: rivaroxaban  Take 20 mg by mouth daily.        Follow-up Information     [employer] Follow up.   Why: This DME company will be providing your NIV machine . They will reach out to you to schedule delivery.               Discharge Exam: Filed Weights   12/30/22 0603 12/30/22 1146  Weight: 99 kg 84.5 kg        General:  AAO x 3,  cooperative, no distress;   HEENT:  Normocephalic, PERRL, otherwise with in Normal limits   Neuro:  CNII-XII intact. , normal motor and sensation, reflexes intact   Lungs:   Clear to auscultation BL, Respirations unlabored,  Chronic but improved wheezing, rhonchi, negative any crackles  Cardio:    S1/S2, RRR, No murmure, No Rubs or Gallops   Abdomen:  Soft, non-tender, bowel sounds active all four  quadrants, no guarding or peritoneal signs.  Muscular  skeletal:  Limited exam -global generalized weaknesses - in bed, able to move all 4 extremities,   2+ pulses,  symmetric, No pitting edema  Skin:  Dry, warm to touch, negative for any Rashes,  Wounds: Please see nursing documentation         Condition at discharge: fair  The results of significant diagnostics from this hospitalization (including imaging, microbiology, ancillary and laboratory) are listed below for reference.   Imaging Studies: DG CHEST PORT 1 VIEW Result Date: 01/04/2023 CLINICAL DATA:  Shortness of breath. EXAM: PORTABLE CHEST 1 VIEW COMPARISON:  Radiographs 01/01/2023 and 12/30/2022.  CT 09/17/2021. FINDINGS: 0926 hours. Two views submitted. The heart size and mediastinal contours are stable. The lungs are hyperinflated with  grossly stable left basilar scarring. No evidence of superimposed airspace disease, edema, pleural effusion or pneumothorax. The bones appear unchanged. Telemetry leads overlie the chest. IMPRESSION: Stable chest. No evidence of acute cardiopulmonary process. Chronic left basilar scarring. Electronically Signed   By: Elsie Perone M.D.   On: 01/04/2023 13:18   DG CHEST PORT 1 VIEW Result Date: 01/01/2023 CLINICAL DATA:  Shortness of breath EXAM: PORTABLE CHEST 1 VIEW COMPARISON:  12/30/2022 FINDINGS: Hyperinflation compatible with COPD. Heart and mediastinal contours within normal limits. No acute confluent airspace opacities or effusions. No acute bony abnormality. IMPRESSION: Emphysema.  No active disease. Electronically Signed   By: Franky Crease M.D.   On: 01/01/2023 15:12   DG Chest Port 1 View Result Date: 12/30/2022 CLINICAL DATA:  63 year old female with sick contacts at home, found hypoxic on 2 liters nasal cannula home oxygen. Shortness of breath. EXAM: PORTABLE CHEST 1 VIEW COMPARISON:  Portable chest 08/18/2022 and earlier. FINDINGS: Portable AP semi upright view at 0726 hours. Centrilobular emphysema confirmed on CT last year. Stable large lung volumes. Mediastinal contours are stable and within normal limits. Visualized tracheal air column is within normal limits. No pneumothorax. No pleural effusion or consolidation. No pulmonary edema. And no convincing acute pulmonary opacity. No acute osseous abnormality identified. Paucity of bowel gas in the upper abdomen. IMPRESSION: Emphysema ([id number]) with no acute cardiopulmonary abnormality identified. Electronically Signed   By: VEAR Hurst M.D.   On: 12/30/2022 07:35    Microbiology: Results for orders placed or performed during the hospital encounter of 12/30/22  MRSA Next Gen by PCR, Nasal     Status: None   Collection Time: 12/30/22 10:46 AM   Specimen: Nasal Mucosa; Nasal Swab  Result Value Ref Range Status   MRSA by PCR Next Gen NOT  DETECTED NOT DETECTED Final    Comment: (NOTE) The GeneXpert MRSA Assay (FDA approved for NASAL specimens only), is one component of a comprehensive MRSA colonization surveillance program. It is not intended to diagnose MRSA infection nor to guide or monitor treatment for MRSA infections. Test performance is not FDA approved in patients less than 13 years old. Performed at [facility], [address], [place], [place] [address]   SARS Coronavirus 2 by RT PCR (hospital order, performed in [facility] hospital lab) *cepheid single result test* Anterior Nasal Swab     Status: None   Collection Time: 12/30/22 10:46 AM   Specimen: Anterior Nasal Swab  Result Value Ref Range Status   SARS Coronavirus 2 by RT PCR NEGATIVE NEGATIVE Final    Comment: (NOTE) SARS-CoV-2 target nucleic acids are NOT DETECTED.  The SARS-CoV-2 RNA is generally detectable in upper and lower respiratory specimens  during the acute phase of infection. The lowest concentration of SARS-CoV-2 viral copies this assay can detect is 250 copies / mL. A negative result does not preclude SARS-CoV-2 infection and should not be used as the sole basis for treatment or other patient management decisions.  A negative result may occur with improper specimen collection / handling, submission of specimen other than nasopharyngeal swab, presence of viral mutation(s) within the areas targeted by this assay, and inadequate number of viral copies (<250 copies / mL). A negative result must be combined with clinical observations, patient history, and epidemiological information.  Fact Sheet for Patients:   roadlaptop.co.za  Fact Sheet for Healthcare Providers: http://kim-miller.com/  This test is not yet approved or  cleared by the [place]  FDA and has been authorized for detection and/or diagnosis of SARS-CoV-2 by FDA under an Emergency Use Authorization (EUA).  This EUA will remain in  effect (meaning this test can be used) for the duration of the COVID-19 declaration under Section 564(b)(1) of the Act, 21 U.S.C. section 360bbb-3(b)(1), unless the authorization is terminated or revoked sooner.  Performed at [facility], [address] [place], [place] [address]   Respiratory (~20 pathogens) panel by PCR     Status: None   Collection Time: 12/30/22 10:51 AM   Specimen: Nasopharyngeal Swab; Respiratory  Result Value Ref Range Status   Adenovirus NOT DETECTED NOT DETECTED Final   Coronavirus 229E NOT DETECTED NOT DETECTED Final    Comment: (NOTE) The Coronavirus on the Respiratory Panel, DOES NOT test for the novel  Coronavirus (2019 nCoV)    Coronavirus HKU1 NOT DETECTED NOT DETECTED Final   Coronavirus NL63 NOT DETECTED NOT DETECTED Final   Coronavirus OC43 NOT DETECTED NOT DETECTED Final   Metapneumovirus NOT DETECTED NOT DETECTED Final   Rhinovirus / Enterovirus NOT DETECTED NOT DETECTED Final   Influenza A NOT DETECTED NOT DETECTED Final   Influenza B NOT DETECTED NOT DETECTED Final   Parainfluenza Virus 1 NOT DETECTED NOT DETECTED Final   Parainfluenza Virus 2 NOT DETECTED NOT DETECTED Final   Parainfluenza Virus 3 NOT DETECTED NOT DETECTED Final   Parainfluenza Virus 4 NOT DETECTED NOT DETECTED Final   Respiratory Syncytial Virus NOT DETECTED NOT DETECTED Final   Bordetella pertussis NOT DETECTED NOT DETECTED Final   Bordetella Parapertussis NOT DETECTED NOT DETECTED Final   Chlamydophila pneumoniae NOT DETECTED NOT DETECTED Final   Mycoplasma pneumoniae NOT DETECTED NOT DETECTED Final    Comment: Performed at [facility], [address] [place], [place] [address]    Labs: CBC: Recent Labs  Lab 01/02/23 0531 01/05/23 0533 01/06/23 0615 01/07/23 0415  WBC 4.1 3.4* 4.2 4.4  HGB 10.9* 10.8* 10.0* 10.4*  HCT 38.4 39.1 35.2* 34.9*  MCV 107.0* 106.8* 103.8* 103.9*  PLT 85* 90* 106* 114*   Basic Metabolic Panel: Recent Labs  Lab  01/02/23 0531 01/05/23 0533 01/06/23 0615 01/07/23 0415  NA 140 142 140 139  K 4.5 4.1 3.7 3.7  CL 93* 92* 92* 94*  CO2 39* 43* 41* 38*  GLUCOSE 112* 94 87 90  BUN 25* 24* 26* 24*  CREATININE 0.53 0.49 0.49 0.48  CALCIUM  9.3 9.4 9.3 9.0   Liver Function Tests: No results for input(s): AST, ALT, ALKPHOS, BILITOT, PROT, ALBUMIN in the last 168 hours. CBG: Recent Labs  Lab 01/06/23 1115 01/06/23 1555 01/06/23 2038 01/07/23 0751 01/07/23 1145  GLUCAP 107* 99 165* 86 106*    Discharge time spent: greater than  40 minutes.  Signed: Adriana DELENA Grams, MD [facility] 01/07/2023

## 2023-01-07 NOTE — Progress Notes (Signed)
 Date and time results received: 01/07/23 08:45 (use smartphrase ".now" to insert current time)  Test: PCO2 Critical Value: 37  Name of Provider Notified: Dr. Flossie Dibble  Orders Received? Or Actions Taken?:  MD notified. Awaiting additional orders.

## 2023-01-07 NOTE — Progress Notes (Signed)
 Flow increased to 5L per sats

## 2023-01-07 NOTE — TOC Transition Note (Signed)
 Transition of Care [facility]) - Discharge Note   Patient Details  Name: Terri Casey MRN: [id number] Date of Birth: [birth year]  [facility] CM/SW Contact:  Sharlyne Stabs, RN Phone Number: 01/07/2023, 12:16 PM   Clinical Narrative:   NIV approved and per [place]ina with [employer] they are ready for delivery. MD will DC patient home. RN updated and [employer] will update RT for delivery.     Final next level of care: Home/Self Care Barriers to Discharge: Barriers Resolved   Patient Goals and CMS Choice Patient states their goals for this hospitalization and ongoing recovery are:: to go home CMS Medicare.gov Compare Post Acute Care list provided to:: Patient Choice offered to / list presented to : Patient      Discharge Placement                    Patient and family notified of of transfer: 01/07/23  Discharge Plan and Services Additional resources added to the After Visit Summary for                  DME Arranged: NIV DME Agency: [employer]   Time DME Agency Contacted: 1215 Representative spoke with at DME Agency: Spoke Mitch - NIV approved            Social Drivers of Health (SDOH) Interventions SDOH Screenings   Food Insecurity: No Food Insecurity (12/30/2022)  Housing: Low Risk  (12/30/2022)  Transportation Needs: No Transportation Needs (12/30/2022)  Utilities: Not At Risk (12/30/2022)  Tobacco Use: High Risk (12/30/2022)     Readmission Risk Interventions    01/04/2023    3:02 PM 12/31/2022    3:45 PM 09/14/2021    3:21 PM  Readmission Risk Prevention Plan  Transportation Screening Complete Complete   PCP or Specialist Appt within 3-5 Days   Complete  Home Care Screening Complete Complete   Medication Review (RN CM) Complete Complete   HRI or Home Care Consult   Complete  Social Work Consult for Recovery Care Planning/Counseling   Complete  Palliative Care Screening   Not Applicable  Medication Review Oceanographer)   Complete

## 2023-01-28 ENCOUNTER — Emergency Department (HOSPITAL_COMMUNITY): Payer: Medicaid Other

## 2023-01-28 ENCOUNTER — Inpatient Hospital Stay (HOSPITAL_COMMUNITY)
Admission: EM | Admit: 2023-01-28 | Discharge: 2023-01-30 | DRG: 190 | Disposition: A | Discharge status: Home / Self Care | Payer: Medicaid Other | Attending: Family Medicine | Admitting: Family Medicine

## 2023-01-28 ENCOUNTER — Other Ambulatory Visit: Payer: Self-pay

## 2023-01-28 ENCOUNTER — Encounter (HOSPITAL_COMMUNITY): Payer: Self-pay | Admitting: Emergency Medicine

## 2023-01-28 DIAGNOSIS — I110 Hypertensive heart disease with heart failure: Secondary | ICD-10-CM | POA: Diagnosis present

## 2023-01-28 DIAGNOSIS — F17210 Nicotine dependence, cigarettes, uncomplicated: Secondary | ICD-10-CM | POA: Diagnosis present

## 2023-01-28 DIAGNOSIS — Z90710 Acquired absence of both cervix and uterus: Secondary | ICD-10-CM

## 2023-01-28 DIAGNOSIS — E46 Unspecified protein-calorie malnutrition: Secondary | ICD-10-CM

## 2023-01-28 DIAGNOSIS — J9621 Acute and chronic respiratory failure with hypoxia: Principal | ICD-10-CM | POA: Diagnosis present

## 2023-01-28 DIAGNOSIS — Z66 Do not resuscitate: Secondary | ICD-10-CM | POA: Diagnosis present

## 2023-01-28 DIAGNOSIS — E119 Type 2 diabetes mellitus without complications: Secondary | ICD-10-CM | POA: Diagnosis present

## 2023-01-28 DIAGNOSIS — E8809 Other disorders of plasma-protein metabolism, not elsewhere classified: Secondary | ICD-10-CM | POA: Diagnosis not present

## 2023-01-28 DIAGNOSIS — J441 Chronic obstructive pulmonary disease with (acute) exacerbation: Secondary | ICD-10-CM | POA: Diagnosis not present

## 2023-01-28 DIAGNOSIS — I4819 Other persistent atrial fibrillation: Secondary | ICD-10-CM | POA: Diagnosis present

## 2023-01-28 DIAGNOSIS — F419 Anxiety disorder, unspecified: Secondary | ICD-10-CM | POA: Diagnosis present

## 2023-01-28 DIAGNOSIS — J9622 Acute and chronic respiratory failure with hypercapnia: Secondary | ICD-10-CM | POA: Diagnosis present

## 2023-01-28 DIAGNOSIS — D649 Anemia, unspecified: Secondary | ICD-10-CM | POA: Diagnosis present

## 2023-01-28 DIAGNOSIS — Z6827 Body mass index (BMI) 27.0-27.9, adult: Secondary | ICD-10-CM

## 2023-01-28 DIAGNOSIS — Z7901 Long term (current) use of anticoagulants: Secondary | ICD-10-CM

## 2023-01-28 DIAGNOSIS — R718 Other abnormality of red blood cells: Secondary | ICD-10-CM | POA: Diagnosis not present

## 2023-01-28 DIAGNOSIS — F32A Depression, unspecified: Secondary | ICD-10-CM | POA: Diagnosis present

## 2023-01-28 DIAGNOSIS — J439 Emphysema, unspecified: Secondary | ICD-10-CM | POA: Diagnosis present

## 2023-01-28 DIAGNOSIS — E785 Hyperlipidemia, unspecified: Secondary | ICD-10-CM | POA: Diagnosis present

## 2023-01-28 DIAGNOSIS — Z79890 Hormone replacement therapy (postmenopausal): Secondary | ICD-10-CM

## 2023-01-28 DIAGNOSIS — Z79899 Other long term (current) drug therapy: Secondary | ICD-10-CM

## 2023-01-28 DIAGNOSIS — R000 Tachycardia, unspecified: Secondary | ICD-10-CM | POA: Diagnosis present

## 2023-01-28 DIAGNOSIS — K219 Gastro-esophageal reflux disease without esophagitis: Secondary | ICD-10-CM | POA: Diagnosis present

## 2023-01-28 DIAGNOSIS — Z7951 Long term (current) use of inhaled steroids: Secondary | ICD-10-CM

## 2023-01-28 DIAGNOSIS — E039 Hypothyroidism, unspecified: Secondary | ICD-10-CM | POA: Diagnosis present

## 2023-01-28 DIAGNOSIS — F418 Other specified anxiety disorders: Secondary | ICD-10-CM | POA: Diagnosis present

## 2023-01-28 DIAGNOSIS — I5032 Chronic diastolic (congestive) heart failure: Secondary | ICD-10-CM | POA: Diagnosis present

## 2023-01-28 DIAGNOSIS — Z720 Tobacco use: Secondary | ICD-10-CM | POA: Diagnosis present

## 2023-01-28 DIAGNOSIS — E441 Mild protein-calorie malnutrition: Secondary | ICD-10-CM | POA: Diagnosis present

## 2023-01-28 LAB — COMPREHENSIVE METABOLIC PANEL
ALT: 10 U/L (ref 0–44)
AST: 13 U/L — ABNORMAL LOW (ref 15–41)
Albumin: 3.3 g/dL — ABNORMAL LOW (ref 3.5–5.0)
Alkaline Phosphatase: 100 U/L (ref 38–126)
Anion gap: 10 (ref 5–15)
BUN: 17 mg/dL (ref 8–23)
CO2: 36 mmol/L — ABNORMAL HIGH (ref 22–32)
Calcium: 9.6 mg/dL (ref 8.9–10.3)
Chloride: 97 mmol/L — ABNORMAL LOW (ref 98–111)
Creatinine, Ser: 0.66 mg/dL (ref 0.44–1.00)
GFR, Estimated: >60 mL/min (ref >60)
Glucose, Bld: 118 mg/dL — ABNORMAL HIGH (ref 70–99)
Potassium: 4.2 mmol/L (ref 3.5–5.1)
Sodium: 143 mmol/L (ref 135–145)
Total Bilirubin: 0.6 mg/dL (ref 0.0–1.2)
Total Protein: 7.2 g/dL (ref 6.5–8.1)

## 2023-01-28 LAB — CBC WITH DIFFERENTIAL/PLATELET
Abs Immature Granulocytes: 0.02 10*3/uL (ref 0.00–0.07)
Basophils Absolute: 0 10*3/uL (ref 0.0–0.1)
Basophils Relative: 0 %
Eosinophils Absolute: 0 10*3/uL (ref 0.0–0.5)
Eosinophils Relative: 0 %
HCT: 43.3 % (ref 36.0–46.0)
Hemoglobin: 12.1 g/dL (ref 12.0–15.0)
Immature Granulocytes: 0 %
Lymphocytes Relative: 14 %
Lymphs Abs: 0.7 10*3/uL (ref 0.7–4.0)
MCH: 30 pg (ref 26.0–34.0)
MCHC: 27.9 g/dL — ABNORMAL LOW (ref 30.0–36.0)
MCV: 107.4 fL — ABNORMAL HIGH (ref 80.0–100.0)
Monocytes Absolute: 0.1 10*3/uL (ref 0.1–1.0)
Monocytes Relative: 2 %
Neutro Abs: 4.4 10*3/uL (ref 1.7–7.7)
Neutrophils Relative %: 84 %
Platelets: 132 10*3/uL — ABNORMAL LOW (ref 150–400)
RBC: 4.03 MIL/uL (ref 3.87–5.11)
RDW: 13.3 % (ref 11.5–15.5)
WBC: 5.3 10*3/uL (ref 4.0–10.5)
nRBC: 0 % (ref 0.0–0.2)

## 2023-01-28 LAB — I-STAT CHEM 8, ED
BUN: 16 mg/dL (ref 8–23)
Calcium, Ion: 1.11 mmol/L — ABNORMAL LOW (ref 1.15–1.40)
Chloride: 97 mmol/L — ABNORMAL LOW (ref 98–111)
Creatinine, Ser: 0.8 mg/dL (ref 0.44–1.00)
Glucose, Bld: 119 mg/dL — ABNORMAL HIGH (ref 70–99)
HCT: 43 % (ref 36.0–46.0)
Hemoglobin: 14.6 g/dL (ref 12.0–15.0)
Potassium: 4.1 mmol/L (ref 3.5–5.1)
Sodium: 142 mmol/L (ref 135–145)
TCO2: 36 mmol/L — ABNORMAL HIGH (ref 22–32)

## 2023-01-28 LAB — RESP PANEL BY RT-PCR (RSV, FLU A&B, COVID)  RVPGX2
Influenza A by PCR: NEGATIVE
Influenza B by PCR: NEGATIVE
Resp Syncytial Virus by PCR: NEGATIVE
SARS Coronavirus 2 by RT PCR: NEGATIVE

## 2023-01-28 LAB — BLOOD GAS, VENOUS
Acid-Base Excess: 11.8 mmol/L — ABNORMAL HIGH (ref 0.0–2.0)
Bicarbonate: 41 mmol/L — ABNORMAL HIGH (ref 20.0–28.0)
Drawn by: 68647
O2 Saturation: 38.5 %
Patient temperature: 36.9
pCO2, Ven: 76 mm[Hg] (ref 44–60)
pH, Ven: 7.34 (ref 7.25–7.43)
pO2, Ven: <31 mm[Hg] — CL (ref 32–45)

## 2023-01-28 LAB — TROPONIN I (HIGH SENSITIVITY)
Troponin I (High Sensitivity): 8 ng/L (ref <18)
Troponin I (High Sensitivity): 9 ng/L (ref <18)

## 2023-01-28 LAB — BRAIN NATRIURETIC PEPTIDE: B Natriuretic Peptide: 37 pg/mL (ref 0.0–100.0)

## 2023-01-28 LAB — MAGNESIUM: Magnesium: 2 mg/dL (ref 1.7–2.4)

## 2023-01-28 MED ORDER — CHLORHEXIDINE GLUCONATE CLOTH 2 % EX PADS
6.0000 | MEDICATED_PAD | Freq: Every day | CUTANEOUS | Status: DC
Start: 2023-01-29 — End: 2023-01-30

## 2023-01-28 MED ORDER — ALBUTEROL SULFATE (2.5 MG/3ML) 0.083% IN NEBU
5.0000 mg | INHALATION_SOLUTION | Freq: Once | RESPIRATORY_TRACT | Status: AC
  Administered 2023-01-28: 5 mg via RESPIRATORY_TRACT
  Filled 2023-01-28: qty 6

## 2023-01-28 NOTE — ED Provider Notes (Signed)
[place] EMERGENCY DEPARTMENT AT [facility] Provider Note   CSN: [id number] Arrival date & time: 01/28/23  1750     History  Chief Complaint  Patient presents with   Shortness of Breath    Terri Casey is a 63 y.o. female.   Shortness of Breath Patient presents for shortness of breath.  Medical history includes HTN, HLD, COPD, atrial fibrillation, CHF, anxiety, depression, DM.  She was recently admitted 1 month ago for acute on chronic hypoxic respiratory failure.  She was treated for COPD exacerbation at the time.  Patient continues to use nebulized breathing treatments at home, approximately 4 times per day.  She sleeps with a breathing machine.  She states that she felt to be in her normal state of health yesterday.  Today, she had chest tightness and shortness of breath.  She received DuoNeb and Solu-Medrol with EMS prior to arrival.  Patient denies any other recent symptoms.  At baseline, she is on 3 L of supplemental oxygen.  She is currently on 4 L.     Home Medications Prior to Admission medications   Medication Sig Start Date End Date Taking? Authorizing Provider  acetaminophen (TYLENOL) 500 MG tablet Take 500 mg by mouth every 6 (six) hours as needed for mild pain (pain score 1-3).   Yes [provider]  albuterol (PROVENTIL) (2.5 MG/3ML) 0.083% nebulizer solution Take 3 mLs (2.5 mg total) by nebulization every 6 (six) hours as needed for wheezing or shortness of breath. 03/21/21  Yes Johnson, Clanford L, MD  albuterol (VENTOLIN HFA) 108 (90 Base) MCG/ACT inhaler Inhale 3 puffs into the lungs every 6 (six) hours as needed for wheezing or shortness of breath.   Yes [provider]  atorvastatin (LIPITOR) 20 MG tablet Take 1 tablet (20 mg total) by mouth at bedtime. 01/07/23  Yes Shahmehdi, Gemma Payor, MD  dextromethorphan-guaiFENesin (MUCINEX DM) 30-600 MG 12hr tablet Take 1 tablet by mouth 2 (two) times daily. 01/07/23  Yes Shahmehdi, Seyed A, MD   escitalopram (LEXAPRO) 10 MG tablet Take 1 tablet (10 mg total) by mouth daily. 01/07/23  Yes Shahmehdi, Seyed A, MD  fluticasone (FLOVENT HFA) 110 MCG/ACT inhaler Inhale 1 puff into the lungs daily as needed (wheezing, shortness of breath).   Yes [provider]  folic acid (FOLVITE) 1 MG tablet Take 1 tablet (1 mg total) by mouth daily. 10/11/19  Yes Tat, Onalee Hua, MD  levothyroxine (SYNTHROID) 200 MCG tablet Take 1 tablet (200 mcg total) by mouth daily at 6 (six) AM. 06/20/19  Yes Swayze, Ava, DO  loratadine (CLARITIN) 10 MG tablet Take 10 mg by mouth at bedtime. 08/04/22  Yes [provider]  metoprolol tartrate (LOPRESSOR) 25 MG tablet Take 12.5 mg by mouth 2 (two) times daily. 08/04/22  Yes [provider]  pantoprazole (PROTONIX) 40 MG tablet Take 1 tablet (40 mg total) by mouth daily. 06/20/19  Yes Swayze, Ava, DO  SUMAtriptan (IMITREX) 50 MG tablet Take 50 mg by mouth every 2 (two) hours as needed for migraine. 07/31/21  Yes [provider]  topiramate (TOPAMAX) 100 MG tablet Take 100 mg by mouth 2 (two) times daily. 06/01/20  Yes [provider]  XARELTO 20 MG TABS tablet Take 20 mg by mouth daily. 05/08/19  Yes [provider]      Allergies    Estrogens, Mustard, and Strawberry extract    Review of Systems   Review of Systems  Constitutional:  Positive for fatigue.  Respiratory:  Positive for chest tightness and shortness of breath.   All other systems reviewed and are negative.   Physical Exam Updated Vital Signs BP 90/61 (BP Location: Left Arm)   Pulse 90   Temp 98.4 F (36.9 C) (Oral)   Resp (!) 25   Ht 5\' 9"  (1.753 m)   Wt 84 kg   SpO2 95%   BMI 27.35 kg/m  Physical Exam Vitals and nursing note reviewed.  Constitutional:      General: She is not in acute distress.    Appearance: She is well-developed. She is not ill-appearing, toxic-appearing or diaphoretic.  HENT:     Head: Normocephalic and atraumatic.      Mouth/Throat:     Mouth: Mucous membranes are moist.  Eyes:     Conjunctiva/sclera: Conjunctivae normal.  Cardiovascular:     Rate and Rhythm: Normal rate and regular rhythm.     Heart sounds: No murmur heard. Pulmonary:     Effort: Tachypnea and prolonged expiration present. No respiratory distress.     Breath sounds: Decreased air movement present. Decreased breath sounds present.  Chest:     Chest wall: No tenderness.  Abdominal:     Palpations: Abdomen is soft.     Tenderness: There is no abdominal tenderness.  Musculoskeletal:        General: No swelling. Normal range of motion.     Cervical back: Normal range of motion and neck supple.     Right lower leg: No edema.     Left lower leg: No edema.  Skin:    General: Skin is warm and dry.     Coloration: Skin is not cyanotic or pale.  Neurological:     General: No focal deficit present.     Mental Status: She is alert and oriented to person, place, and time.  Psychiatric:        Mood and Affect: Mood normal.        Behavior: Behavior normal.     ED Results / Procedures / Treatments   Labs (all labs ordered are listed, but only abnormal results are displayed) Labs Reviewed  COMPREHENSIVE METABOLIC PANEL - Abnormal; Notable for the following components:      Result Value   Chloride 97 (*)    CO2 36 (*)    Glucose, Bld 118 (*)    Albumin 3.3 (*)    AST 13 (*)    All other components within normal limits  BLOOD GAS, VENOUS - Abnormal; Notable for the following components:   pCO2, Ven 76 (*)    pO2, Ven <31 (*)    Bicarbonate 41.0 (*)    Acid-Base Excess 11.8 (*)    All other components within normal limits  CBC WITH DIFFERENTIAL/PLATELET - Abnormal; Notable for the following components:   MCV 107.4 (*)    MCHC 27.9 (*)    Platelets 132 (*)    All other components within normal limits  I-STAT CHEM 8, ED - Abnormal; Notable for the following components:   Chloride 97 (*)    Glucose, Bld 119 (*)    Calcium, Ion  1.11 (*)    TCO2 36 (*)    All other components within normal limits  RESP PANEL BY RT-PCR (RSV, FLU A&B, COVID)  [id number]  BRAIN NATRIURETIC PEPTIDE  MAGNESIUM  TROPONIN I (HIGH SENSITIVITY)  TROPONIN I (HIGH SENSITIVITY)    EKG EKG Interpretation Date/Time:  Monday January 28 2023 19:14:12 EST Ventricular Rate:  105 PR  Interval:  151 QRS Duration:  75 QT Interval:  337 QTC Calculation: 446 R Axis:   98  Text Interpretation: Sinus tachycardia Right axis deviation Confirmed by Gloris Manchester [phone]) on 01/28/2023 8:18:58 PM  Radiology DG Chest Port 1 View Result Date: 01/28/2023 CLINICAL DATA:  Dyspnea.  Increasing shortness of breath. EXAM: PORTABLE CHEST 1 VIEW COMPARISON:  Most recent radiograph 01/04/2023 common chest CT 09/17/2021 reviewed FINDINGS: Emphysema with chronic hyperinflation. Stable heart size and mediastinal contours. Stable appearance of scarring at the left lung base. No acute airspace disease. No pneumothorax or large pleural effusion. Blunting of the costophrenic angles typically related to hyperinflation. No pneumothorax. IMPRESSION: Emphysema with chronic hyperinflation. No acute findings. Electronically Signed   By: Narda Rutherford M.D.   On: 01/28/2023 19:22    Procedures Procedures    Medications Ordered in ED Medications  albuterol (PROVENTIL) (2.5 MG/3ML) 0.083% nebulizer solution 5 mg (5 mg Nebulization Given 01/28/23 1852)    ED Course/ Medical Decision Making/ A&P Clinical Course as of 01/28/23 2309  Mon Jan 28, 2023  2307 GFR, Estimated: >60 [RD]    Clinical Course User Index [RD] Gloris Manchester, MD                                 Medical Decision Making Amount and/or Complexity of Data Reviewed Labs: ordered. Radiology: ordered.  Risk Prescription drug management. Decision regarding hospitalization.   This patient presents to the ED for concern of shortness of breath, this involves an extensive number of treatment options, and is a  complaint that carries with it a high risk of complications and morbidity.  The differential diagnosis includes COPD exacerbation, CHF, pneumonia, URI, anemia, acidosis   Co morbidities that complicate the patient evaluation  HTN, HLD, COPD, atrial fibrillation, CHF, anxiety, depression, DM   Additional history obtained:  Additional history obtained from N/A External records from outside source obtained and reviewed including EMR   Lab Tests:  I Ordered, and personally interpreted labs.  The pertinent results include: Largely compensated hypercarbia on blood gas, normal kidney function, normal electrolytes, normal hemoglobin, no leukocytosis, normal troponin, normal BNP   Imaging Studies ordered:  I ordered imaging studies including chest x-ray I independently visualized and interpreted imaging which showed chronic emphysematous change without acute findings I agree with the radiologist interpretation   Cardiac Monitoring: / EKG:  The patient was maintained on a cardiac monitor.  I personally viewed and interpreted the cardiac monitored which showed an underlying rhythm of: Sinus rhythm   Problem List / ED Course / Critical interventions / Medication management  Patient with history of COPD and CHF, presenting for shortness of breath, reportedly starting today.  On arrival, she is alert and oriented.  She has increased work of breathing, tachypnea, and prolonged expiration.  On lung auscultation, she has diminished air movement.  Patient currently in hallway.  Will work on getting her in her room and initiate BiPAP with continued nebulized breathing treatment.  Workup was initiated.  Patient was started on BiPAP and tolerated it well.  She received additional albuterol.  Lab work was notable for mostly compensated hypercarbia on blood gas, chronically elevated bicarb, no leukocytosis, normal BNP, and normal troponin.  X-ray showed chronic emphysematous change without focal opacity.   Given her absence of productive cough today, will defer antibiotics to admitting team.  Given patient's severe symptoms on arrival, patient to be admitted for further  management. I ordered medication including albuterol for COPD exacerbation Reevaluation of the patient after these medicines showed that the patient improved I have reviewed the patients home medicines and have made adjustments as needed   Social Determinants of Health:  Frequent hospitalizations  CRITICAL CARE Performed by: Gloris Manchester   Total critical care time: 32 minutes  Critical care time was exclusive of separately billable procedures and treating other patients.  Critical care was necessary to treat or prevent imminent or life-threatening deterioration.  Critical care was time spent personally by me on the following activities: development of treatment plan with patient and/or surrogate as well as nursing, discussions with consultants, evaluation of patient's response to treatment, examination of patient, obtaining history from patient or surrogate, ordering and performing treatments and interventions, ordering and review of laboratory studies, ordering and review of radiographic studies, pulse oximetry and re-evaluation of patient's condition.          Final Clinical Impression(s) / ED Diagnoses Final diagnoses:  COPD exacerbation (HCC)  Acute on chronic respiratory failure with hypoxia [facility])    Rx / DC Orders ED Discharge Orders     None         Gloris Manchester, MD 01/28/23 2309

## 2023-01-28 NOTE — ED Triage Notes (Signed)
Pt brought in by ems they gave pt 125mg  of solumedrol and 1 duoneb. Pt c/o increased sob and pt is on continuous o2 at home.

## 2023-01-28 NOTE — H&P (Signed)
History and Physical    Patient: Terri Casey:[id number] DOB: 13-Mar-1960 DOA: 01/28/2023 DOS: the patient was seen and examined on 01/29/2023 PCP: Karl Bales, NP  Patient coming from: Home  Chief Complaint:  Chief Complaint  Patient presents with   Shortness of Breath   HPI: Terri Casey is a 63 y.o. female with medical history significant of COPD with chronic respiratory failure with hypoxia on supplemental oxygen at 3 LPM via [place], chronic diastolic CHF, hypothyroidism, PAF on Xarelto, depression who presents to the emergency department due to increasing shortness of breath and chest tightness which occurred today. She was recently admitted from 12/30/2022 to 01/07/2023 due to acute on chronic respiratory failure with hypoxia and hypercapnia secondary to COPD exacerbation during which she required BiPAP.  She states that she has been compliant with breathing treatment at home. She activated EMS and patient was treated with DuoNeb and Solu-Medrol en route to the ED.  ED Course:  In the emergency department, she was tachypneic, pulse was 97 bpm, BP 105/71, temperature 98.4 F, O2 sat 99% on supplemental oxygen at 4 LPM.  Workup in the ED was normal except for bicarb of 35 and blood glucose of 114, albumin 3.1, troponin x 2 was normal, BNP 37.0, magnesium 2.0.  Influenza A, B, SARS coronavirus 2, RSV was negative. Chest x-ray showed emphysema with chronic hyperinflation.  No acute findings Breathing treatment with albuterol was given.  Review of Systems: Review of systems as noted in the HPI. All other systems reviewed and are negative.   Past Medical History:  Diagnosis Date   Anxiety    Asthma    CHF (congestive heart failure) (HCC)    COPD (chronic obstructive pulmonary disease) (HCC)    Diabetes mellitus without complication (HCC)    Hypertension    Thyroid disease    Past Surgical History:  Procedure Laterality Date   ABDOMINAL HYSTERECTOMY     THORACENTESIS  Left 09/18/2021   Procedure: THORACENTESIS;  Surgeon: Lupita Leash, MD;  Location[facility] ENDOSCOPY;  Service: Cardiopulmonary;  Laterality: Left;    Social History:  reports that she has been smoking cigarettes. She has a 10 pack-year smoking history. She has never used smokeless tobacco. She reports that she does not drink alcohol and does not use drugs.   Allergies  Allergen Reactions   Estrogens Hives   Mustard Hives   Strawberry Extract Hives    History reviewed. No pertinent family history.   Prior to Admission medications   Medication Sig Start Date End Date Taking? Authorizing Provider  acetaminophen (TYLENOL) 500 MG tablet Take 500 mg by mouth every 6 (six) hours as needed for mild pain (pain score 1-3).   Yes [provider]  albuterol (PROVENTIL) (2.5 MG/3ML) 0.083% nebulizer solution Take 3 mLs (2.5 mg total) by nebulization every 6 (six) hours as needed for wheezing or shortness of breath. 03/21/21  Yes Johnson, Clanford L, MD  albuterol (VENTOLIN HFA) 108 (90 Base) MCG/ACT inhaler Inhale 3 puffs into the lungs every 6 (six) hours as needed for wheezing or shortness of breath.   Yes [provider]  atorvastatin (LIPITOR) 20 MG tablet Take 1 tablet (20 mg total) by mouth at bedtime. 01/07/23  Yes Shahmehdi, Gemma Payor, MD  dextromethorphan-guaiFENesin (MUCINEX DM) 30-600 MG 12hr tablet Take 1 tablet by mouth 2 (two) times daily. 01/07/23  Yes Shahmehdi, Seyed A, MD  escitalopram (LEXAPRO) 10 MG tablet Take 1 tablet (10 mg total) by mouth daily.  01/07/23  Yes Shahmehdi, Seyed A, MD  fluticasone (FLOVENT HFA) 110 MCG/ACT inhaler Inhale 1 puff into the lungs daily as needed (wheezing, shortness of breath).   Yes [provider]  folic acid (FOLVITE) 1 MG tablet Take 1 tablet (1 mg total) by mouth daily. 10/11/19  Yes Tat, Onalee Hua, MD  levothyroxine (SYNTHROID) 200 MCG tablet Take 1 tablet (200 mcg total) by mouth daily at 6 (six) AM. 06/20/19  Yes Swayze, Ava, DO   loratadine (CLARITIN) 10 MG tablet Take 10 mg by mouth at bedtime. 08/04/22  Yes [provider]  metoprolol tartrate (LOPRESSOR) 25 MG tablet Take 12.5 mg by mouth 2 (two) times daily. 08/04/22  Yes [provider]  pantoprazole (PROTONIX) 40 MG tablet Take 1 tablet (40 mg total) by mouth daily. 06/20/19  Yes Swayze, Ava, DO  SUMAtriptan (IMITREX) 50 MG tablet Take 50 mg by mouth every 2 (two) hours as needed for migraine. 07/31/21  Yes [provider]  topiramate (TOPAMAX) 100 MG tablet Take 100 mg by mouth 2 (two) times daily. 06/01/20  Yes [provider]  XARELTO 20 MG TABS tablet Take 20 mg by mouth daily. 05/08/19  Yes [provider]    Physical Exam: BP (!) 124/100   Pulse (!) 103   Temp 98.2 F (36.8 C) (Axillary)   Resp (!) 32   Ht 5\' 9"  (1.753 m)   Wt 80.8 kg   SpO2 99%   BMI 26.31 kg/m   General: 63 y.o. year-old female well developed well nourished in no acute distress.  Alert and oriented x3. HEENT: NCAT, EOMI Neck: Supple, trachea medial Cardiovascular: Regular rate and rhythm with no rubs or gallops.  No thyromegaly or JVD noted.  No lower extremity edema. 2/4 pulses in all 4 extremities. Respiratory: Decreased breath sounds and wheezing in lower lobes on auscultation with no wheezes or rales. Good inspiratory effort. Abdomen: Soft, nontender nondistended with normal bowel sounds x4 quadrants. Muskuloskeletal: No cyanosis, clubbing or edema noted bilaterally Neuro: CN II-XII intact, strength 5/5 x 4, sensation, reflexes intact Skin: No ulcerative lesions noted or rashes Psychiatry: Judgement and insight appear normal. Mood is appropriate for condition and setting          Labs on Admission:  Basic Metabolic Panel: Recent Labs  Lab 01/28/23 1935 01/28/23 1943 01/29/23 0524  NA 143 142 143  K 4.2 4.1 4.1  CL 97* 97* 99  CO2 36*  --  35*  GLUCOSE 118* 119* 114*  BUN 17 16 20   CREATININE 0.66 0.80 0.76  CALCIUM 9.6  --   9.7  MG 2.0  --   --    Liver Function Tests: Recent Labs  Lab 01/28/23 1935 01/29/23 0524  AST 13* 14*  ALT 10 10  ALKPHOS 100 87  BILITOT 0.6 0.5  PROT 7.2 6.7  ALBUMIN 3.3* 3.1*   No results for input(s): "LIPASE", "AMYLASE" in the last 168 hours. No results for input(s): "AMMONIA" in the last 168 hours. CBC: Recent Labs  Lab 01/28/23 1935 01/28/23 1943 01/29/23 0524  WBC 5.3  --  3.7*  NEUTROABS 4.4  --   --   HGB 12.1 14.6 10.6*  HCT 43.3 43.0 36.6  MCV 107.4*  --  102.8*  PLT 132*  --  144*   Cardiac Enzymes: No results for input(s): "CKTOTAL", "CKMB", "CKMBINDEX", "TROPONINI" in the last 168 hours.  BNP (last 3 results) Recent Labs    08/16/22 2017 12/30/22 0555 01/28/23 1935  BNP 53.0 35.0 37.0    ProBNP (last 3 results) No results for input(s): "PROBNP" in the last 8760 hours.  CBG: No results for input(s): "GLUCAP" in the last 168 hours.  Radiological Exams on Admission: DG Chest Port 1 View Result Date: 01/28/2023 CLINICAL DATA:  Dyspnea.  Increasing shortness of breath. EXAM: PORTABLE CHEST 1 VIEW COMPARISON:  Most recent radiograph 01/04/2023 common chest CT 09/17/2021 reviewed FINDINGS: Emphysema with chronic hyperinflation. Stable heart size and mediastinal contours. Stable appearance of scarring at the left lung base. No acute airspace disease. No pneumothorax or large pleural effusion. Blunting of the costophrenic angles typically related to hyperinflation. No pneumothorax. IMPRESSION: Emphysema with chronic hyperinflation. No acute findings. Electronically Signed   By: Narda Rutherford M.D.   On: 01/28/2023 19:22    EKG: I independently viewed the EKG done and my findings are as followed: Sinus tachycardia at rate of 105 bpm  Assessment/Plan Present on Admission:  Acute exacerbation of chronic obstructive pulmonary disease (COPD) (HCC)  Acute on chronic respiratory failure with hypoxia and hypercapnia (HCC)  Chronic diastolic CHF  (congestive heart failure) (HCC)  Acquired hypothyroidism  Depression with anxiety  Tobacco abuse  Principal Problem:   Acute exacerbation of chronic obstructive pulmonary disease (COPD) (HCC) Active Problems:   Chronic diastolic CHF (congestive heart failure) (HCC)   Tobacco abuse   Acquired hypothyroidism   Acute on chronic respiratory failure with hypoxia and hypercapnia (HCC)   Depression with anxiety   Hypoalbuminemia due to protein-calorie malnutrition (HCC)   Elevated MCV   Persistent atrial fibrillation (HCC)   GERD (gastroesophageal reflux disease)   Acute on chronic respiratory failure with hypoxia and hypercapnia due to acute exacerbation of COPD Continue duo nebs, Mucinex, Solu-Medrol, azithromycin. Continue Protonix to prevent steroid-induced ulcer Continue incentive spirometry and flutter valve Continue BiPAP with plan to transition to supplemental oxygen to maintain O2 sat > 92% with eventual plan to wean patient to home oxygen level as tolerated  Elevated MCV MCV 107.4, vitamin B12 and folate levels will be checked  Hypoalbuminemia possibly secondary to mild protein calorie malnutrition Albumin 3.3, protein supplement will be provided  Persistent atrial fibrillation Patient is currently having sinus tachycardia at a rate of 105 bpm, albuterol could have contributed to the tachycardia Continue metoprolol and Xarelto   Chronic diastolic CHF Patient is euvolemic echo from 09/21/2018 with EF of 60 to 65% with grade 1 diastolic dysfunction, mild LVH Continue metoprolol,   Acquired hypothyroidism Continue Levothyroxine  GERD Continue Protonix especially while on steroids  Depression/Anxiety Stable, continue Lexapro  Tobacco Abuse Patient was advised on tobacco abuse cessation    DVT prophylaxis: Xarelto  Family Communication: None at bedside   Advance Care Planning:   Code Status: Limited: Do not attempt resuscitation (DNR) -DNR-LIMITED -Do Not  Intubate/DNI    Consults: None  Severity of Illness: The appropriate patient status for this patient is OBSERVATION. Observation status is judged to be reasonable and necessary in order to provide the required intensity of service to ensure the patient's safety. The patient's presenting symptoms, physical exam findings, and initial radiographic and laboratory data in the context of their medical condition is felt to place them at decreased risk for further clinical deterioration. Furthermore, it is anticipated that the patient will be medically stable for discharge from the hospital within 2 midnights of admission.   Author: Frankey Shown, DO 01/29/2023 7:37 AM  For on call review [email or web address].

## 2023-01-29 DIAGNOSIS — Z79899 Other long term (current) drug therapy: Secondary | ICD-10-CM | POA: Diagnosis not present

## 2023-01-29 DIAGNOSIS — E46 Unspecified protein-calorie malnutrition: Secondary | ICD-10-CM | POA: Insufficient documentation

## 2023-01-29 DIAGNOSIS — I110 Hypertensive heart disease with heart failure: Secondary | ICD-10-CM | POA: Diagnosis present

## 2023-01-29 DIAGNOSIS — Z7901 Long term (current) use of anticoagulants: Secondary | ICD-10-CM | POA: Diagnosis not present

## 2023-01-29 DIAGNOSIS — J9622 Acute and chronic respiratory failure with hypercapnia: Secondary | ICD-10-CM | POA: Diagnosis present

## 2023-01-29 DIAGNOSIS — J441 Chronic obstructive pulmonary disease with (acute) exacerbation: Secondary | ICD-10-CM | POA: Diagnosis present

## 2023-01-29 DIAGNOSIS — E039 Hypothyroidism, unspecified: Secondary | ICD-10-CM | POA: Diagnosis present

## 2023-01-29 DIAGNOSIS — E785 Hyperlipidemia, unspecified: Secondary | ICD-10-CM | POA: Diagnosis present

## 2023-01-29 DIAGNOSIS — E8809 Other disorders of plasma-protein metabolism, not elsewhere classified: Secondary | ICD-10-CM | POA: Diagnosis present

## 2023-01-29 DIAGNOSIS — Z7951 Long term (current) use of inhaled steroids: Secondary | ICD-10-CM | POA: Diagnosis not present

## 2023-01-29 DIAGNOSIS — Z79890 Hormone replacement therapy (postmenopausal): Secondary | ICD-10-CM | POA: Diagnosis not present

## 2023-01-29 DIAGNOSIS — D649 Anemia, unspecified: Secondary | ICD-10-CM | POA: Diagnosis present

## 2023-01-29 DIAGNOSIS — E441 Mild protein-calorie malnutrition: Secondary | ICD-10-CM | POA: Diagnosis present

## 2023-01-29 DIAGNOSIS — R000 Tachycardia, unspecified: Secondary | ICD-10-CM | POA: Diagnosis present

## 2023-01-29 DIAGNOSIS — E119 Type 2 diabetes mellitus without complications: Secondary | ICD-10-CM | POA: Diagnosis present

## 2023-01-29 DIAGNOSIS — I4819 Other persistent atrial fibrillation: Secondary | ICD-10-CM | POA: Diagnosis present

## 2023-01-29 DIAGNOSIS — I5032 Chronic diastolic (congestive) heart failure: Secondary | ICD-10-CM | POA: Diagnosis present

## 2023-01-29 DIAGNOSIS — K219 Gastro-esophageal reflux disease without esophagitis: Secondary | ICD-10-CM | POA: Diagnosis present

## 2023-01-29 DIAGNOSIS — J9621 Acute and chronic respiratory failure with hypoxia: Secondary | ICD-10-CM | POA: Diagnosis present

## 2023-01-29 DIAGNOSIS — Z6827 Body mass index (BMI) 27.0-27.9, adult: Secondary | ICD-10-CM | POA: Diagnosis not present

## 2023-01-29 DIAGNOSIS — F419 Anxiety disorder, unspecified: Secondary | ICD-10-CM | POA: Diagnosis present

## 2023-01-29 DIAGNOSIS — Z66 Do not resuscitate: Secondary | ICD-10-CM | POA: Diagnosis present

## 2023-01-29 DIAGNOSIS — F17210 Nicotine dependence, cigarettes, uncomplicated: Secondary | ICD-10-CM | POA: Diagnosis present

## 2023-01-29 DIAGNOSIS — F32A Depression, unspecified: Secondary | ICD-10-CM | POA: Diagnosis present

## 2023-01-29 DIAGNOSIS — Z720 Tobacco use: Secondary | ICD-10-CM | POA: Diagnosis not present

## 2023-01-29 DIAGNOSIS — R718 Other abnormality of red blood cells: Secondary | ICD-10-CM | POA: Insufficient documentation

## 2023-01-29 DIAGNOSIS — J439 Emphysema, unspecified: Secondary | ICD-10-CM | POA: Diagnosis present

## 2023-01-29 LAB — CBC
HCT: 36.6 % (ref 36.0–46.0)
Hemoglobin: 10.6 g/dL — ABNORMAL LOW (ref 12.0–15.0)
MCH: 29.8 pg (ref 26.0–34.0)
MCHC: 29 g/dL — ABNORMAL LOW (ref 30.0–36.0)
MCV: 102.8 fL — ABNORMAL HIGH (ref 80.0–100.0)
Platelets: 144 10*3/uL — ABNORMAL LOW (ref 150–400)
RBC: 3.56 MIL/uL — ABNORMAL LOW (ref 3.87–5.11)
RDW: 13.4 % (ref 11.5–15.5)
WBC: 3.7 10*3/uL — ABNORMAL LOW (ref 4.0–10.5)
nRBC: 0 % (ref 0.0–0.2)

## 2023-01-29 LAB — COMPREHENSIVE METABOLIC PANEL
ALT: 10 U/L (ref 0–44)
AST: 14 U/L — ABNORMAL LOW (ref 15–41)
Albumin: 3.1 g/dL — ABNORMAL LOW (ref 3.5–5.0)
Alkaline Phosphatase: 87 U/L (ref 38–126)
Anion gap: 9 (ref 5–15)
BUN: 20 mg/dL (ref 8–23)
CO2: 35 mmol/L — ABNORMAL HIGH (ref 22–32)
Calcium: 9.7 mg/dL (ref 8.9–10.3)
Chloride: 99 mmol/L (ref 98–111)
Creatinine, Ser: 0.76 mg/dL (ref 0.44–1.00)
GFR, Estimated: >60 mL/min (ref >60)
Glucose, Bld: 114 mg/dL — ABNORMAL HIGH (ref 70–99)
Potassium: 4.1 mmol/L (ref 3.5–5.1)
Sodium: 143 mmol/L (ref 135–145)
Total Bilirubin: 0.5 mg/dL (ref 0.0–1.2)
Total Protein: 6.7 g/dL (ref 6.5–8.1)

## 2023-01-29 LAB — RETICULOCYTES
Immature Retic Fract: 16.5 % — ABNORMAL HIGH (ref 2.3–15.9)
RBC.: 3.54 MIL/uL — ABNORMAL LOW (ref 3.87–5.11)
Retic Count, Absolute: 49.6 10*3/uL (ref 19.0–186.0)
Retic Ct Pct: 1.4 % (ref 0.4–3.1)

## 2023-01-29 LAB — IRON AND TIBC
Iron: 37 ug/dL (ref 28–170)
Saturation Ratios: 12 % (ref 10.4–31.8)
TIBC: 312 ug/dL (ref 250–450)
UIBC: 275 ug/dL

## 2023-01-29 LAB — FOLATE: Folate: >40 ng/mL (ref >5.9)

## 2023-01-29 LAB — MRSA NEXT GEN BY PCR, NASAL: MRSA by PCR Next Gen: NOT DETECTED

## 2023-01-29 LAB — FERRITIN: Ferritin: 41 ng/mL (ref 11–307)

## 2023-01-29 LAB — VITAMIN B12: Vitamin B-12: 387 pg/mL (ref 180–914)

## 2023-01-29 MED ORDER — LORATADINE 10 MG PO TABS
10.0000 mg | ORAL_TABLET | Freq: Every day | ORAL | Status: DC
  Administered 2023-01-29: 10 mg via ORAL
  Filled 2023-01-29: qty 1

## 2023-01-29 MED ORDER — DM-GUAIFENESIN ER 30-600 MG PO TB12
1.0000 | ORAL_TABLET | Freq: Two times a day (BID) | ORAL | Status: DC
  Administered 2023-01-29 – 2023-01-30 (×3): 1 via ORAL
  Filled 2023-01-29 (×3): qty 1

## 2023-01-29 MED ORDER — ACETAMINOPHEN 325 MG PO TABS
650.0000 mg | ORAL_TABLET | Freq: Four times a day (QID) | ORAL | Status: DC | PRN
  Administered 2023-01-29 (×2): 650 mg via ORAL
  Filled 2023-01-29 (×2): qty 2

## 2023-01-29 MED ORDER — ENOXAPARIN SODIUM 40 MG/0.4ML IJ SOSY: 40.0000 mg | PREFILLED_SYRINGE | INTRAMUSCULAR | Status: DC

## 2023-01-29 MED ORDER — IPRATROPIUM-ALBUTEROL 0.5-2.5 (3) MG/3ML IN SOLN: 3.0000 mL | RESPIRATORY_TRACT | Status: DC | PRN

## 2023-01-29 MED ORDER — METOPROLOL TARTRATE 25 MG PO TABS
12.5000 mg | ORAL_TABLET | Freq: Two times a day (BID) | ORAL | Status: DC
  Administered 2023-01-29 – 2023-01-30 (×3): 12.5 mg via ORAL
  Filled 2023-01-29 (×3): qty 1

## 2023-01-29 MED ORDER — ENSURE ENLIVE PO LIQD: 237.0000 mL | Freq: Two times a day (BID) | ORAL | Status: DC

## 2023-01-29 MED ORDER — FOLIC ACID 1 MG PO TABS
1.0000 mg | ORAL_TABLET | Freq: Every day | ORAL | Status: DC
  Administered 2023-01-29 – 2023-01-30 (×2): 1 mg via ORAL
  Filled 2023-01-29 (×2): qty 1

## 2023-01-29 MED ORDER — ORAL CARE MOUTH RINSE: 15.0000 mL | OROMUCOSAL | Status: DC | PRN

## 2023-01-29 MED ORDER — TOPIRAMATE 100 MG PO TABS
100.0000 mg | ORAL_TABLET | Freq: Two times a day (BID) | ORAL | Status: DC
  Administered 2023-01-29 – 2023-01-30 (×3): 100 mg via ORAL
  Filled 2023-01-29 (×3): qty 1

## 2023-01-29 MED ORDER — ESCITALOPRAM OXALATE 10 MG PO TABS
10.0000 mg | ORAL_TABLET | Freq: Every day | ORAL | Status: DC
  Administered 2023-01-29 – 2023-01-30 (×2): 10 mg via ORAL
  Filled 2023-01-29 (×2): qty 1

## 2023-01-29 MED ORDER — AZITHROMYCIN 250 MG PO TABS
250.0000 mg | ORAL_TABLET | Freq: Every day | ORAL | Status: DC
  Administered 2023-01-30: 250 mg via ORAL
  Filled 2023-01-29: qty 1

## 2023-01-29 MED ORDER — IPRATROPIUM-ALBUTEROL 0.5-2.5 (3) MG/3ML IN SOLN
3.0000 mL | Freq: Four times a day (QID) | RESPIRATORY_TRACT | Status: DC
  Administered 2023-01-29 – 2023-01-30 (×5): 3 mL via RESPIRATORY_TRACT
  Filled 2023-01-29 (×5): qty 3

## 2023-01-29 MED ORDER — DM-GUAIFENESIN ER 30-600 MG PO TB12: 1.0000 | ORAL_TABLET | Freq: Two times a day (BID) | ORAL | Status: DC

## 2023-01-29 MED ORDER — ONDANSETRON HCL 4 MG PO TABS: 4.0000 mg | ORAL_TABLET | Freq: Four times a day (QID) | ORAL | Status: DC | PRN

## 2023-01-29 MED ORDER — ACETAMINOPHEN 650 MG RE SUPP
650.0000 mg | Freq: Four times a day (QID) | RECTAL | Status: DC | PRN
Start: 2023-01-29 — End: 2023-01-30

## 2023-01-29 MED ORDER — PANTOPRAZOLE SODIUM 40 MG PO TBEC
40.0000 mg | DELAYED_RELEASE_TABLET | Freq: Every day | ORAL | Status: DC
  Administered 2023-01-29 – 2023-01-30 (×2): 40 mg via ORAL
  Filled 2023-01-29 (×2): qty 1

## 2023-01-29 MED ORDER — PANTOPRAZOLE SODIUM 40 MG PO TBEC: 40.0000 mg | DELAYED_RELEASE_TABLET | Freq: Every day | ORAL | Status: DC

## 2023-01-29 MED ORDER — ONDANSETRON HCL 4 MG/2ML IJ SOLN: 4.0000 mg | Freq: Four times a day (QID) | INTRAMUSCULAR | Status: DC | PRN

## 2023-01-29 MED ORDER — METHYLPREDNISOLONE SODIUM SUCC 40 MG IJ SOLR
40.0000 mg | Freq: Two times a day (BID) | INTRAMUSCULAR | Status: DC
  Administered 2023-01-29 – 2023-01-30 (×3): 40 mg via INTRAVENOUS
  Filled 2023-01-29 (×3): qty 1

## 2023-01-29 MED ORDER — LEVOTHYROXINE SODIUM 100 MCG PO TABS
200.0000 ug | ORAL_TABLET | Freq: Every day | ORAL | Status: DC
  Administered 2023-01-30: 200 ug via ORAL
  Filled 2023-01-29: qty 2

## 2023-01-29 MED ORDER — RIVAROXABAN 20 MG PO TABS
20.0000 mg | ORAL_TABLET | Freq: Every day | ORAL | Status: DC
  Administered 2023-01-29: 20 mg via ORAL
  Filled 2023-01-29: qty 1

## 2023-01-29 MED ORDER — ATORVASTATIN CALCIUM 20 MG PO TABS
20.0000 mg | ORAL_TABLET | Freq: Every day | ORAL | Status: DC
  Administered 2023-01-29: 20 mg via ORAL
  Filled 2023-01-29: qty 1

## 2023-01-29 MED ORDER — ORAL CARE MOUTH RINSE
15.0000 mL | OROMUCOSAL | Status: DC
Start: 2023-01-29 — End: 2023-01-30
  Administered 2023-01-29 – 2023-01-30 (×2): 15 mL via OROMUCOSAL

## 2023-01-29 MED ORDER — AZITHROMYCIN 250 MG PO TABS
500.0000 mg | ORAL_TABLET | Freq: Every day | ORAL | Status: AC
  Administered 2023-01-29: 500 mg via ORAL
  Filled 2023-01-29: qty 2

## 2023-01-29 NOTE — Progress Notes (Signed)
PROGRESS NOTE    Terri Casey  WUJ:[id number] DOB: [birth year] DOA: 01/28/2023 PCP: Karl Bales, NP   Brief Narrative:    Terri Casey is a 63 y.o. female with medical history significant of COPD with chronic respiratory failure with hypoxia on supplemental oxygen at 3 LPM via [place], chronic diastolic CHF, hypothyroidism, PAF on Xarelto, depression who presents to the emergency department due to increasing shortness of breath and chest tightness which occurred today.  Patient was admitted with acute on chronic hypoxemic respiratory failure along with hypercapnia due to acute COPD exacerbation.  Assessment & Plan:   Principal Problem:   Acute exacerbation of chronic obstructive pulmonary disease (COPD) (HCC) Active Problems:   Chronic diastolic CHF (congestive heart failure) (HCC)   Tobacco abuse   Acquired hypothyroidism   Acute on chronic respiratory failure with hypoxia and hypercapnia (HCC)   Depression with anxiety   Hypoalbuminemia due to protein-calorie malnutrition (HCC)   Elevated MCV   Persistent atrial fibrillation (HCC)   GERD (gastroesophageal reflux disease)  Assessment and Plan:   Acute on chronic respiratory failure with hypoxia and hypercapnia due to acute exacerbation of COPD Continue duo nebs, Mucinex, Solu-Medrol, azithromycin. Continue Protonix to prevent steroid-induced ulcer Continue incentive spirometry and flutter valve Continue BiPAP with plan to transition to supplemental oxygen to maintain O2 sat > 92% with eventual plan to wean patient to home oxygen level as tolerated She is currently on 4 L nasal cannula and normally wears 3 L at home Okay for transfer to telemetry   Anemia Appears to be at baseline, monitor CBC No overt bleeding noted Check full anemia panel   Hypoalbuminemia possibly secondary to mild protein calorie malnutrition Albumin 3.3, protein supplement will be provided   Persistent atrial fibrillation Patient is  currently having sinus tachycardia at a rate of 105 bpm, albuterol could have contributed to the tachycardia Continue metoprolol and Xarelto   Chronic diastolic CHF Patient is euvolemic echo from 09/21/2018 with EF of 60 to 65% with grade 1 diastolic dysfunction, mild LVH Continue metoprolol   Acquired hypothyroidism Continue Levothyroxine   GERD Continue Protonix especially while on steroids   Depression/Anxiety Stable, continue Lexapro   Tobacco Abuse Patient was advised on tobacco abuse cessation    DVT prophylaxis: Xarelto Code Status: DNR Family Communication: None at bedside Disposition Plan:  Status is: Observation The patient will require care spanning > 2 midnights and should be moved to inpatient because: Need for IV medications.   Consultants:  None  Procedures:  None  Antimicrobials:  Anti-infectives (From admission, onward)    Start     Dose/Rate Route Frequency Ordered Stop   01/30/23 1000  azithromycin (ZITHROMAX) tablet 250 mg       Placed in "Followed by" Linked Group   250 mg Oral Daily 01/29/23 0717 02/03/23 0959   01/29/23 1000  azithromycin (ZITHROMAX) tablet 500 mg       Placed in "Followed by" Linked Group   500 mg Oral Daily 01/29/23 0717 01/30/23 0959      Subjective: Patient seen and evaluated today with no new acute complaints or concerns. No acute concerns or events noted overnight.  Objective: Vitals:   01/29/23 0500 01/29/23 0600 01/29/23 0700 01/29/23 0744  BP: 121/71 (!) 118/95 (!) 124/100   Pulse: (!) 104 (!) 103 (!) 103   Resp: (!) 27 (!) 32  (!) 26  Temp:    98.6 F (37 C)  TempSrc:    Oral  SpO2:  100% 99% 99%   Weight:      Height:        Intake/Output Summary (Last 24 hours) at 01/29/2023 0831 Last data filed at 01/29/2023 1610 Gross per 24 hour  Intake --  Output 250 ml  Net -250 ml   Filed Weights   01/28/23 1800 01/29/23 0013  Weight: 84 kg 80.8 kg    Examination:  General exam: Appears calm and  comfortable  Respiratory system: Diminished to auscultation bilaterally. Respiratory effort normal.  4 L nasal cannula Cardiovascular system: S1 & S2 heard, RRR.  Gastrointestinal system: Abdomen is soft Central nervous system: Alert and awake Extremities: No edema Skin: No significant lesions noted Psychiatry: Flat affect.    Data Reviewed: I have personally reviewed following labs and imaging studies  CBC: Recent Labs  Lab 01/28/23 1935 01/28/23 1943 01/29/23 0524  WBC 5.3  --  3.7*  NEUTROABS 4.4  --   --   HGB 12.1 14.6 10.6*  HCT 43.3 43.0 36.6  MCV 107.4*  --  102.8*  PLT 132*  --  144*   Basic Metabolic Panel: Recent Labs  Lab 01/28/23 1935 01/28/23 1943 01/29/23 0524  NA 143 142 143  K 4.2 4.1 4.1  CL 97* 97* 99  CO2 36*  --  35*  GLUCOSE 118* 119* 114*  BUN 17 16 20   CREATININE 0.66 0.80 0.76  CALCIUM 9.6  --  9.7  MG 2.0  --   --    GFR: Estimated Creatinine Clearance: 80.8 mL/min (by C-G formula based on SCr of 0.76 mg/dL). Liver Function Tests: Recent Labs  Lab 01/28/23 1935 01/29/23 0524  AST 13* 14*  ALT 10 10  ALKPHOS 100 87  BILITOT 0.6 0.5  PROT 7.2 6.7  ALBUMIN 3.3* 3.1*   No results for input(s): "LIPASE", "AMYLASE" in the last 168 hours. No results for input(s): "AMMONIA" in the last 168 hours. Coagulation Profile: No results for input(s): "INR", "PROTIME" in the last 168 hours. Cardiac Enzymes: No results for input(s): "CKTOTAL", "CKMB", "CKMBINDEX", "TROPONINI" in the last 168 hours. BNP (last 3 results) No results for input(s): "PROBNP" in the last 8760 hours. HbA1C: No results for input(s): "HGBA1C" in the last 72 hours. CBG: No results for input(s): "GLUCAP" in the last 168 hours. Lipid Profile: No results for input(s): "CHOL", "HDL", "LDLCALC", "TRIG", "CHOLHDL", "LDLDIRECT" in the last 72 hours. Thyroid Function Tests: No results for input(s): "TSH", "T4TOTAL", "FREET4", "T3FREE", "THYROIDAB" in the last 72  hours. Anemia Panel: Recent Labs    01/29/23 0524  VITAMINB12 387   Sepsis Labs: No results for input(s): "PROCALCITON", "LATICACIDVEN" in the last 168 hours.  Recent Results (from the past 240 hours)  Resp panel by RT-PCR (RSV, Flu A&B, Covid) Anterior Nasal Swab     Status: None   Collection Time: 01/28/23  7:07 PM   Specimen: Anterior Nasal Swab  Result Value Ref Range Status   SARS Coronavirus 2 by RT PCR NEGATIVE NEGATIVE Final    Comment: (NOTE) SARS-CoV-2 target nucleic acids are NOT DETECTED.  The SARS-CoV-2 RNA is generally detectable in upper respiratory specimens during the acute phase of infection. The lowest concentration of SARS-CoV-2 viral copies this assay can detect is 138 copies/mL. A negative result does not preclude SARS-Cov-2 infection and should not be used as the sole basis for treatment or other patient management decisions. A negative result may occur with  improper specimen collection/handling, submission of specimen other than nasopharyngeal swab, presence of viral  mutation(s) within the areas targeted by this assay, and inadequate number of viral copies(<138 copies/mL). A negative result must be combined with clinical observations, patient history, and epidemiological information. The expected result is Negative.  Fact Sheet for Patients:  BloggerCourse.com  Fact Sheet for Healthcare Providers:  SeriousBroker.it  This test is no t yet approved or cleared by the [place] FDA and  has been authorized for detection and/or diagnosis of SARS-CoV-2 by FDA under an Emergency Use Authorization (EUA). This EUA will remain  in effect (meaning this test can be used) for the duration of the COVID-19 declaration under Section 564(b)(1) of the Act, 21 U.S.C.section 360bbb-3(b)(1), unless the authorization is terminated  or revoked sooner.       Influenza A by PCR NEGATIVE NEGATIVE Final   Influenza B  by PCR NEGATIVE NEGATIVE Final    Comment: (NOTE) The Xpert Xpress SARS-CoV-2/FLU/RSV plus assay is intended as an aid in the diagnosis of influenza from Nasopharyngeal swab specimens and should not be used as a sole basis for treatment. Nasal washings and aspirates are unacceptable for Xpert Xpress SARS-CoV-2/FLU/RSV testing.  Fact Sheet for Patients: BloggerCourse.com  Fact Sheet for Healthcare Providers: SeriousBroker.it  This test is not yet approved or cleared by the [place] FDA and has been authorized for detection and/or diagnosis of SARS-CoV-2 by FDA under an Emergency Use Authorization (EUA). This EUA will remain in effect (meaning this test can be used) for the duration of the COVID-19 declaration under Section 564(b)(1) of the Act, 21 U.S.C. section 360bbb-3(b)(1), unless the authorization is terminated or revoked.     Resp Syncytial Virus by PCR NEGATIVE NEGATIVE Final    Comment: (NOTE) Fact Sheet for Patients: BloggerCourse.com  Fact Sheet for Healthcare Providers: SeriousBroker.it  This test is not yet approved or cleared by the [place] FDA and has been authorized for detection and/or diagnosis of SARS-CoV-2 by FDA under an Emergency Use Authorization (EUA). This EUA will remain in effect (meaning this test can be used) for the duration of the COVID-19 declaration under Section 564(b)(1) of the Act, 21 U.S.C. section 360bbb-3(b)(1), unless the authorization is terminated or revoked.  Performed at [facility], [address], [place], [place] [address]          Radiology Studies: [facility] Chest [facility] 1 View Result Date: 01/28/2023 CLINICAL DATA:  Dyspnea.  Increasing shortness of breath. EXAM: PORTABLE CHEST 1 VIEW COMPARISON:  Most recent radiograph 01/04/2023 common chest CT 09/17/2021 reviewed FINDINGS: Emphysema with chronic hyperinflation. Stable  heart size and mediastinal contours. Stable appearance of scarring at the left lung base. No acute airspace disease. No pneumothorax or large pleural effusion. Blunting of the costophrenic angles typically related to hyperinflation. No pneumothorax. IMPRESSION: Emphysema with chronic hyperinflation. No acute findings. Electronically Signed   By: Narda Rutherford M.D.   On: 01/28/2023 19:22        Scheduled Meds:  azithromycin  500 mg Oral Daily   Followed by   Melene Muller ON 01/30/2023] azithromycin  250 mg Oral Daily   Chlorhexidine Gluconate Cloth  6 each Topical Daily   dextromethorphan-guaiFENesin  1 tablet Oral BID   feeding supplement  237 mL Oral BID BM   ipratropium-albuterol  3 mL Nebulization Q6H   methylPREDNISolone (SOLU-MEDROL) injection  40 mg Intravenous Q12H   mouth rinse  15 mL Mouth Rinse 4 times per day   pantoprazole  40 mg Oral Daily     LOS: 0 days    Time spent: 75  minutes    Analycia Khokhar Hoover Brunette, DO [facility]  If 7PM-7AM, please contact night-coverage [email or web address] 01/29/2023, 8:31 AM

## 2023-01-29 NOTE — Plan of Care (Signed)

## 2023-01-29 NOTE — Progress Notes (Signed)
Transition of Care Department [facility]) has reviewed patient and no other TOC needs have been identified at this time. We will continue to monitor patient advancement through interdisciplinary progression rounds. If new patient transition needs arise, please place a TOC consult.   01/29/23 0906  TOC Brief Assessment  Insurance and Status Reviewed  Patient has primary care physician Yes  Home environment has been reviewed Lives with son.  Prior level of function: Requires some assist. Has CAP aid 8 hours a day Monday through Friday.  Prior/Current Home Services No current home services  Social Drivers of Health Review SDOH reviewed no interventions necessary  Readmission risk has been reviewed Yes  Transition of care needs no transition of care needs at this time

## 2023-01-30 DIAGNOSIS — K219 Gastro-esophageal reflux disease without esophagitis: Secondary | ICD-10-CM | POA: Diagnosis not present

## 2023-01-30 DIAGNOSIS — I4819 Other persistent atrial fibrillation: Secondary | ICD-10-CM | POA: Diagnosis not present

## 2023-01-30 DIAGNOSIS — Z720 Tobacco use: Secondary | ICD-10-CM | POA: Diagnosis not present

## 2023-01-30 DIAGNOSIS — J441 Chronic obstructive pulmonary disease with (acute) exacerbation: Secondary | ICD-10-CM | POA: Diagnosis not present

## 2023-01-30 LAB — CBC
HCT: 32.3 % — ABNORMAL LOW (ref 36.0–46.0)
Hemoglobin: 9.6 g/dL — ABNORMAL LOW (ref 12.0–15.0)
MCH: 29.7 pg (ref 26.0–34.0)
MCHC: 29.7 g/dL — ABNORMAL LOW (ref 30.0–36.0)
MCV: 100 fL (ref 80.0–100.0)
Platelets: 143 10*3/uL — ABNORMAL LOW (ref 150–400)
RBC: 3.23 MIL/uL — ABNORMAL LOW (ref 3.87–5.11)
RDW: 13.8 % (ref 11.5–15.5)
WBC: 4 10*3/uL (ref 4.0–10.5)
nRBC: 0 % (ref 0.0–0.2)

## 2023-01-30 LAB — BASIC METABOLIC PANEL
Anion gap: 9 (ref 5–15)
BUN: 21 mg/dL (ref 8–23)
CO2: 31 mmol/L (ref 22–32)
Calcium: 9.1 mg/dL (ref 8.9–10.3)
Chloride: 98 mmol/L (ref 98–111)
Creatinine, Ser: 0.65 mg/dL (ref 0.44–1.00)
GFR, Estimated: >60 mL/min (ref >60)
Glucose, Bld: 142 mg/dL — ABNORMAL HIGH (ref 70–99)
Potassium: 3.7 mmol/L (ref 3.5–5.1)
Sodium: 138 mmol/L (ref 135–145)

## 2023-01-30 LAB — MAGNESIUM: Magnesium: 1.7 mg/dL (ref 1.7–2.4)

## 2023-01-30 MED ORDER — PREDNISONE 20 MG PO TABS: ORAL_TABLET | ORAL | 0 refills | Status: DC

## 2023-01-30 MED ORDER — ALBUTEROL SULFATE HFA 108 (90 BASE) MCG/ACT IN AERS: 3.0000 | INHALATION_SPRAY | Freq: Four times a day (QID) | RESPIRATORY_TRACT | 5 refills | Status: AC | PRN

## 2023-01-30 MED ORDER — DM-GUAIFENESIN ER 30-600 MG PO TB12: 1.0000 | ORAL_TABLET | Freq: Two times a day (BID) | ORAL | 0 refills | Status: DC | PRN

## 2023-01-30 MED ORDER — ALBUTEROL SULFATE (2.5 MG/3ML) 0.083% IN NEBU: 2.5000 mg | INHALATION_SOLUTION | Freq: Four times a day (QID) | RESPIRATORY_TRACT | 5 refills | Status: AC | PRN

## 2023-01-30 MED ORDER — DOXYCYCLINE HYCLATE 100 MG PO CAPS: 100.0000 mg | ORAL_CAPSULE | Freq: Two times a day (BID) | ORAL | 0 refills | Status: AC

## 2023-01-30 MED ORDER — FLUTICASONE PROPIONATE HFA 110 MCG/ACT IN AERO: 1.0000 | INHALATION_SPRAY | Freq: Every day | RESPIRATORY_TRACT | 3 refills | Status: AC

## 2023-01-30 NOTE — Plan of Care (Signed)
Problem: Education: Goal: Knowledge of General Education information will improve Description Including pain rating scale, medication(s)/side effects and non-pharmacologic comfort measures Outcome: Progressing   Problem: Health Behavior/Discharge Planning: Goal: Ability to manage health-related needs will improve Outcome: Progressing

## 2023-01-30 NOTE — Discharge Summary (Signed)
Physician Discharge Summary  Terri Casey ZOX:[id number] DOB: [birth year] DOA: 01/28/2023  PCP: Karl Bales, NP  Admit date: 01/28/2023 Discharge date: 01/30/2023  Admitted From:  HOME  Disposition: HOME   Recommendations for Outpatient Follow-up:  Follow up with PCP in 1 weeks Please check CBC in 1 week   Discharge Condition: STABLE   CODE STATUS: DNR  DIET: resume prior home diet    Brief Hospitalization Summary: Please see all hospital notes, images, labs for full details of the hospitalization.  63 y.o. female with medical history significant of COPD with chronic respiratory failure with hypoxia on supplemental oxygen at 3 LPM via [place], chronic diastolic CHF, hypothyroidism, PAF on Xarelto, depression who presents to the emergency department due to increasing shortness of breath and chest tightness which occurred today.  Patient was admitted with acute on chronic hypoxemic respiratory failure along with hypercapnia due to acute COPD exacerbation.   Acute on chronic respiratory failure with hypoxia and hypercapnia due to acute exacerbation of COPD Treated with duo nebs, Mucinex, Solu-Medrol, azithromycin.  Treated with Protonix to prevent steroid-induced ulcer.  Continue incentive spirometry and flutter valve Initially treated with BiPAP followed by transition to supplemental oxygen to maintain O2 sat > 92% with eventual plan to wean patient to home oxygen level as tolerated She is currently back on home requirement of 3 L nasal cannula and normally wears 3 L at home.  Pt says she feels well to discharge home today DC home on steroid taper, doxycycline, bronchodilators, mucolytics Close follow up with PCP in 7-10 days   Anemia Appears to be at baseline, monitor CBC No overt bleeding noted Checked full anemia panel - reassuring; all normal values   Hypoalbuminemia possibly secondary to mild protein calorie malnutrition Albumin 3.3, protein supplement provided   Persistent  atrial fibrillation Patient is currently having sinus tachycardia at a rate of 105 bpm, albuterol could have contributed to the tachycardia Continue metoprolol and Xarelto   Chronic diastolic CHF Patient is euvolemic echo from 09/21/2018 with EF of 60 to 65% with grade 1 diastolic dysfunction, mild LVH Continue metoprolol   Acquired hypothyroidism Continue Levothyroxine   GERD Continue Protonix especially while on steroids   Depression/Anxiety Stable, continue Lexapro   Tobacco Abuse Patient was advised on tobacco abuse cessation   Discharge Diagnoses:  Principal Problem:   Acute exacerbation of chronic obstructive pulmonary disease (COPD) (HCC) Active Problems:   Chronic diastolic CHF (congestive heart failure) (HCC)   Tobacco abuse   Acquired hypothyroidism   Acute on chronic respiratory failure with hypoxia and hypercapnia (HCC)   Depression with anxiety   Hypoalbuminemia due to protein-calorie malnutrition (HCC)   Elevated MCV   Persistent atrial fibrillation (HCC)   GERD (gastroesophageal reflux disease)   Discharge Instructions:  Allergies as of 01/30/2023       Reactions   Estrogens Hives   Mustard Hives   Strawberry Extract Hives        Medication List     TAKE these medications    acetaminophen 500 MG tablet Commonly known as: TYLENOL Take 500 mg by mouth every 6 (six) hours as needed for mild pain (pain score 1-3).   albuterol (2.5 MG/3ML) 0.083% nebulizer solution Commonly known as: PROVENTIL Take 3 mLs (2.5 mg total) by nebulization every 6 (six) hours as needed for wheezing or shortness of breath.   albuterol 108 (90 Base) MCG/ACT inhaler Commonly known as: VENTOLIN HFA Inhale 3 puffs into the lungs every 6 (six) hours  as needed for wheezing or shortness of breath.   atorvastatin 20 MG tablet Commonly known as: LIPITOR Take 1 tablet (20 mg total) by mouth at bedtime.   dextromethorphan-guaiFENesin 30-600 MG 12hr tablet Commonly known  as: MUCINEX DM Take 1 tablet by mouth 2 (two) times daily as needed for cough. What changed:  when to take this reasons to take this   doxycycline 100 MG capsule Commonly known as: VIBRAMYCIN Take 1 capsule (100 mg total) by mouth 2 (two) times daily for 3 days. Start taking on: January 31, 2023   escitalopram 10 MG tablet Commonly known as: LEXAPRO Take 1 tablet (10 mg total) by mouth daily.   fluticasone 110 MCG/ACT inhaler Commonly known as: FLOVENT HFA Inhale 1 puff into the lungs daily. What changed:  when to take this reasons to take this   folic acid 1 MG tablet Commonly known as: FOLVITE Take 1 tablet (1 mg total) by mouth daily.   levothyroxine 200 MCG tablet Commonly known as: SYNTHROID Take 1 tablet (200 mcg total) by mouth daily at 6 (six) AM.   loratadine 10 MG tablet Commonly known as: CLARITIN Take 10 mg by mouth at bedtime.   metoprolol tartrate 25 MG tablet Commonly known as: LOPRESSOR Take 12.5 mg by mouth 2 (two) times daily.   pantoprazole 40 MG tablet Commonly known as: PROTONIX Take 1 tablet (40 mg total) by mouth daily.   predniSONE 20 MG tablet Commonly known as: DELTASONE Take 3 PO QAM x3days, 2 PO QAM x3days, 1 PO QAM x3days Start taking on: January 31, 2023   SUMAtriptan 50 MG tablet Commonly known as: IMITREX Take 50 mg by mouth every 2 (two) hours as needed for migraine.   topiramate 100 MG tablet Commonly known as: TOPAMAX Take 100 mg by mouth 2 (two) times daily.   Xarelto 20 MG Tabs tablet Generic drug: rivaroxaban Take 20 mg by mouth daily.        Follow-up Information     Karl Bales, NP. Schedule an appointment as soon as possible for a visit in 1 week(s).   Specialty: Family Medicine Why: Hospital Follow Up Contact information: [address] [place] [address] [phone]                Allergies  Allergen Reactions   Estrogens Hives   Mustard Hives   Strawberry Extract Hives    Allergies as of 01/30/2023       Reactions   Estrogens Hives   Mustard Hives   Strawberry Extract Hives        Medication List     TAKE these medications    acetaminophen 500 MG tablet Commonly known as: TYLENOL Take 500 mg by mouth every 6 (six) hours as needed for mild pain (pain score 1-3).   albuterol (2.5 MG/3ML) 0.083% nebulizer solution Commonly known as: PROVENTIL Take 3 mLs (2.5 mg total) by nebulization every 6 (six) hours as needed for wheezing or shortness of breath.   albuterol 108 (90 Base) MCG/ACT inhaler Commonly known as: VENTOLIN HFA Inhale 3 puffs into the lungs every 6 (six) hours as needed for wheezing or shortness of breath.   atorvastatin 20 MG tablet Commonly known as: LIPITOR Take 1 tablet (20 mg total) by mouth at bedtime.   dextromethorphan-guaiFENesin 30-600 MG 12hr tablet Commonly known as: MUCINEX DM Take 1 tablet by mouth 2 (two) times daily as needed for cough. What changed:  when to take this reasons to  take this   doxycycline 100 MG capsule Commonly known as: VIBRAMYCIN Take 1 capsule (100 mg total) by mouth 2 (two) times daily for 3 days. Start taking on: January 31, 2023   escitalopram 10 MG tablet Commonly known as: LEXAPRO Take 1 tablet (10 mg total) by mouth daily.   fluticasone 110 MCG/ACT inhaler Commonly known as: FLOVENT HFA Inhale 1 puff into the lungs daily. What changed:  when to take this reasons to take this   folic acid 1 MG tablet Commonly known as: FOLVITE Take 1 tablet (1 mg total) by mouth daily.   levothyroxine 200 MCG tablet Commonly known as: SYNTHROID Take 1 tablet (200 mcg total) by mouth daily at 6 (six) AM.   loratadine 10 MG tablet Commonly known as: CLARITIN Take 10 mg by mouth at bedtime.   metoprolol tartrate 25 MG tablet Commonly known as: LOPRESSOR Take 12.5 mg by mouth 2 (two) times daily.   pantoprazole 40 MG tablet Commonly known as: PROTONIX Take 1 tablet (40 mg total) by  mouth daily.   predniSONE 20 MG tablet Commonly known as: DELTASONE Take 3 PO QAM x3days, 2 PO QAM x3days, 1 PO QAM x3days Start taking on: January 31, 2023   SUMAtriptan 50 MG tablet Commonly known as: IMITREX Take 50 mg by mouth every 2 (two) hours as needed for migraine.   topiramate 100 MG tablet Commonly known as: TOPAMAX Take 100 mg by mouth 2 (two) times daily.   Xarelto 20 MG Tabs tablet Generic drug: rivaroxaban Take 20 mg by mouth daily.        Procedures/Studies: DG Chest Port 1 View Result Date: 01/28/2023 CLINICAL DATA:  Dyspnea.  Increasing shortness of breath. EXAM: PORTABLE CHEST 1 VIEW COMPARISON:  Most recent radiograph 01/04/2023 common chest CT 09/17/2021 reviewed FINDINGS: Emphysema with chronic hyperinflation. Stable heart size and mediastinal contours. Stable appearance of scarring at the left lung base. No acute airspace disease. No pneumothorax or large pleural effusion. Blunting of the costophrenic angles typically related to hyperinflation. No pneumothorax. IMPRESSION: Emphysema with chronic hyperinflation. No acute findings. Electronically Signed   By: Narda Rutherford M.D.   On: 01/28/2023 19:22   DG CHEST PORT 1 VIEW Result Date: 01/04/2023 CLINICAL DATA:  Shortness of breath. EXAM: PORTABLE CHEST 1 VIEW COMPARISON:  Radiographs 01/01/2023 and 12/30/2022.  CT 09/17/2021. FINDINGS: 0926 hours. Two views submitted. The heart size and mediastinal contours are stable. The lungs are hyperinflated with grossly stable left basilar scarring. No evidence of superimposed airspace disease, edema, pleural effusion or pneumothorax. The bones appear unchanged. Telemetry leads overlie the chest. IMPRESSION: Stable chest. No evidence of acute cardiopulmonary process. Chronic left basilar scarring. Electronically Signed   By: Carey Bullocks M.D.   On: 01/04/2023 13:18   DG CHEST PORT 1 VIEW Result Date: 01/01/2023 CLINICAL DATA:  Shortness of breath EXAM: PORTABLE CHEST 1  VIEW COMPARISON:  12/30/2022 FINDINGS: Hyperinflation compatible with COPD. Heart and mediastinal contours within normal limits. No acute confluent airspace opacities or effusions. No acute bony abnormality. IMPRESSION: Emphysema.  No active disease. Electronically Signed   By: Charlett Nose M.D.   On: 01/01/2023 15:12     Subjective: Pt reports she is breathing much better today, pt says she feels she can manage well at home.    Discharge Exam: Vitals:   01/30/23 0819 01/30/23 1255  BP:  (!) 109/59  Pulse:  66  Resp:  17  Temp:  98.7 F (37.1 C)  SpO2: 96% 97%  Vitals:   01/29/23 2031 01/30/23 0306 01/30/23 0819 01/30/23 1255  BP: 118/65 105/62  (!) 109/59  Pulse: 88 75  66  Resp:    17  Temp: 98.6 F (37 C) 98.7 F (37.1 C)  98.7 F (37.1 C)  TempSrc: Oral Oral    SpO2: 98% 96% 96% 97%  Weight:      Height:        General: Pt is alert, awake, not in acute distress Cardiovascular: RRR, S1/S2 +, no rubs, no gallops Respiratory: rare expiratory wheezing, no rhonchi, no increased WOB Abdominal: Soft, NT, ND, bowel sounds + Extremities: no edema, no cyanosis   The results of significant diagnostics from this hospitalization (including imaging, microbiology, ancillary and laboratory) are listed below for reference.     Microbiology: Recent Results (from the past 240 hours)  Resp panel by RT-PCR (RSV, Flu A&B, Covid) Anterior Nasal Swab     Status: None   Collection Time: 01/28/23  7:07 PM   Specimen: Anterior Nasal Swab  Result Value Ref Range Status   SARS Coronavirus 2 by RT PCR NEGATIVE NEGATIVE Final    Comment: (NOTE) SARS-CoV-2 target nucleic acids are NOT DETECTED.  The SARS-CoV-2 RNA is generally detectable in upper respiratory specimens during the acute phase of infection. The lowest concentration of SARS-CoV-2 viral copies this assay can detect is 138 copies/mL. A negative result does not preclude SARS-Cov-2 infection and should not be used as the sole  basis for treatment or other patient management decisions. A negative result may occur with  improper specimen collection/handling, submission of specimen other than nasopharyngeal swab, presence of viral mutation(s) within the areas targeted by this assay, and inadequate number of viral copies(<138 copies/mL). A negative result must be combined with clinical observations, patient history, and epidemiological information. The expected result is Negative.  Fact Sheet for Patients:  BloggerCourse.com  Fact Sheet for Healthcare Providers:  SeriousBroker.it  This test is no t yet approved or cleared by the [place] FDA and  has been authorized for detection and/or diagnosis of SARS-CoV-2 by FDA under an Emergency Use Authorization (EUA). This EUA will remain  in effect (meaning this test can be used) for the duration of the COVID-19 declaration under Section 564(b)(1) of the Act, 21 U.S.C.section 360bbb-3(b)(1), unless the authorization is terminated  or revoked sooner.       Influenza A by PCR NEGATIVE NEGATIVE Final   Influenza B by PCR NEGATIVE NEGATIVE Final    Comment: (NOTE) The Xpert Xpress SARS-CoV-2/FLU/RSV plus assay is intended as an aid in the diagnosis of influenza from Nasopharyngeal swab specimens and should not be used as a sole basis for treatment. Nasal washings and aspirates are unacceptable for Xpert Xpress SARS-CoV-2/FLU/RSV testing.  Fact Sheet for Patients: BloggerCourse.com  Fact Sheet for Healthcare Providers: SeriousBroker.it  This test is not yet approved or cleared by the [place] FDA and has been authorized for detection and/or diagnosis of SARS-CoV-2 by FDA under an Emergency Use Authorization (EUA). This EUA will remain in effect (meaning this test can be used) for the duration of the COVID-19 declaration under Section 564(b)(1) of the Act,  21 U.S.C. section 360bbb-3(b)(1), unless the authorization is terminated or revoked.     Resp Syncytial Virus by PCR NEGATIVE NEGATIVE Final    Comment: (NOTE) Fact Sheet for Patients: BloggerCourse.com  Fact Sheet for Healthcare Providers: SeriousBroker.it  This test is not yet approved or cleared by the [place] and has been authorized for  detection and/or diagnosis of SARS-CoV-2 by FDA under an Emergency Use Authorization (EUA). This EUA will remain in effect (meaning this test can be used) for the duration of the COVID-19 declaration under Section 564(b)(1) of the Act, 21 U.S.C. section 360bbb-3(b)(1), unless the authorization is terminated or revoked.  Performed at [facility], [address] [place], [place] [address]   MRSA Next Gen by PCR, Nasal     Status: None   Collection Time: 01/29/23 12:13 AM   Specimen: Nasal Mucosa; Nasal Swab  Result Value Ref Range Status   MRSA by PCR Next Gen NOT DETECTED NOT DETECTED Final    Comment: (NOTE) The GeneXpert MRSA Assay (FDA approved for NASAL specimens only), is one component of a comprehensive MRSA colonization surveillance program. It is not intended to diagnose MRSA infection nor to guide or monitor treatment for MRSA infections. Test performance is not FDA approved in patients less than 75 years old. Performed at [facility], [address] [place], [place] [address]      Labs: BNP (last 3 results) Recent Labs    08/16/22 2017 12/30/22 0555 01/28/23 1935  BNP 53.0 35.0 37.0   Basic Metabolic Panel: Recent Labs  Lab 01/28/23 1935 01/28/23 1943 01/29/23 0524 01/30/23 0412  NA 143 142 143 138  K 4.2 4.1 4.1 3.7  CL 97* 97* 99 98  CO2 36*  --  35* 31  GLUCOSE 118* 119* 114* 142*  BUN 17 16 20 21   CREATININE 0.66 0.80 0.76 0.65  CALCIUM 9.6  --  9.7 9.1  MG 2.0  --   --  1.7   Liver Function Tests: Recent Labs  Lab 01/28/23 1935  01/29/23 0524  AST 13* 14*  ALT 10 10  ALKPHOS 100 87  BILITOT 0.6 0.5  PROT 7.2 6.7  ALBUMIN 3.3* 3.1*   No results for input(s): "LIPASE", "AMYLASE" in the last 168 hours. No results for input(s): "AMMONIA" in the last 168 hours. CBC: Recent Labs  Lab 01/28/23 1935 01/28/23 1943 01/29/23 0524 01/30/23 0412  WBC 5.3  --  3.7* 4.0  NEUTROABS 4.4  --   --   --   HGB 12.1 14.6 10.6* 9.6*  HCT 43.3 43.0 36.6 32.3*  MCV 107.4*  --  102.8* 100.0  PLT 132*  --  144* 143*   Cardiac Enzymes: No results for input(s): "CKTOTAL", "CKMB", "CKMBINDEX", "TROPONINI" in the last 168 hours. BNP: Invalid input(s): "POCBNP" CBG: No results for input(s): "GLUCAP" in the last 168 hours. D-Dimer No results for input(s): "DDIMER" in the last 72 hours. Hgb A1c No results for input(s): "HGBA1C" in the last 72 hours. Lipid Profile No results for input(s): "CHOL", "HDL", "LDLCALC", "TRIG", "CHOLHDL", "LDLDIRECT" in the last 72 hours. Thyroid function studies No results for input(s): "TSH", "T4TOTAL", "T3FREE", "THYROIDAB" in the last 72 hours.  Invalid input(s): "FREET3" Anemia work up Recent Labs    01/29/23 0524  VITAMINB12 387  FOLATE >40.0  FERRITIN 41  TIBC 312  IRON 37  RETICCTPCT 1.4   Urinalysis    Component Value Date/Time   COLORURINE AMBER (A) 08/16/2022 2237   APPEARANCEUR CLOUDY (A) 08/16/2022 2237   LABSPEC 1.018 08/16/2022 2237   PHURINE 5.0 08/16/2022 2237   GLUCOSEU NEGATIVE 08/16/2022 2237   HGBUR SMALL (A) 08/16/2022 2237   BILIRUBINUR NEGATIVE 08/16/2022 2237   KETONESUR NEGATIVE 08/16/2022 2237   PROTEINUR 100 (A) 08/16/2022 2237   NITRITE NEGATIVE 08/16/2022 2237   LEUKOCYTESUR NEGATIVE 08/16/2022 2237  Sepsis Labs Recent Labs  Lab 01/28/23 1935 01/29/23 0524 01/30/23 0412  WBC 5.3 3.7* 4.0   Microbiology Recent Results (from the past 240 hours)  Resp panel by RT-PCR (RSV, Flu A&B, Covid) Anterior Nasal Swab     Status: None   Collection Time:  01/28/23  7:07 PM   Specimen: Anterior Nasal Swab  Result Value Ref Range Status   SARS Coronavirus 2 by RT PCR NEGATIVE NEGATIVE Final    Comment: (NOTE) SARS-CoV-2 target nucleic acids are NOT DETECTED.  The SARS-CoV-2 RNA is generally detectable in upper respiratory specimens during the acute phase of infection. The lowest concentration of SARS-CoV-2 viral copies this assay can detect is 138 copies/mL. A negative result does not preclude SARS-Cov-2 infection and should not be used as the sole basis for treatment or other patient management decisions. A negative result may occur with  improper specimen collection/handling, submission of specimen other than nasopharyngeal swab, presence of viral mutation(s) within the areas targeted by this assay, and inadequate number of viral copies(<138 copies/mL). A negative result must be combined with clinical observations, patient history, and epidemiological information. The expected result is Negative.  Fact Sheet for Patients:  BloggerCourse.com  Fact Sheet for Healthcare Providers:  SeriousBroker.it  This test is no t yet approved or cleared by the [place] FDA and  has been authorized for detection and/or diagnosis of SARS-CoV-2 by FDA under an Emergency Use Authorization (EUA). This EUA will remain  in effect (meaning this test can be used) for the duration of the COVID-19 declaration under Section 564(b)(1) of the Act, 21 U.S.C.section 360bbb-3(b)(1), unless the authorization is terminated  or revoked sooner.       Influenza A by PCR NEGATIVE NEGATIVE Final   Influenza B by PCR NEGATIVE NEGATIVE Final    Comment: (NOTE) The Xpert Xpress SARS-CoV-2/FLU/RSV plus assay is intended as an aid in the diagnosis of influenza from Nasopharyngeal swab specimens and should not be used as a sole basis for treatment. Nasal washings and aspirates are unacceptable for Xpert Xpress  SARS-CoV-2/FLU/RSV testing.  Fact Sheet for Patients: BloggerCourse.com  Fact Sheet for Healthcare Providers: SeriousBroker.it  This test is not yet approved or cleared by the [place] FDA and has been authorized for detection and/or diagnosis of SARS-CoV-2 by FDA under an Emergency Use Authorization (EUA). This EUA will remain in effect (meaning this test can be used) for the duration of the COVID-19 declaration under Section 564(b)(1) of the Act, 21 U.S.C. section 360bbb-3(b)(1), unless the authorization is terminated or revoked.     Resp Syncytial Virus by PCR NEGATIVE NEGATIVE Final    Comment: (NOTE) Fact Sheet for Patients: BloggerCourse.com  Fact Sheet for Healthcare Providers: SeriousBroker.it  This test is not yet approved or cleared by the [place] FDA and has been authorized for detection and/or diagnosis of SARS-CoV-2 by FDA under an Emergency Use Authorization (EUA). This EUA will remain in effect (meaning this test can be used) for the duration of the COVID-19 declaration under Section 564(b)(1) of the Act, 21 U.S.C. section 360bbb-3(b)(1), unless the authorization is terminated or revoked.  Performed at [facility], [address], [place], [place] [address]   MRSA Next Gen by PCR, Nasal     Status: None   Collection Time: 01/29/23 12:13 AM   Specimen: Nasal Mucosa; Nasal Swab  Result Value Ref Range Status   MRSA by PCR Next Gen NOT DETECTED NOT DETECTED Final    Comment: (NOTE) The GeneXpert MRSA Assay (  FDA approved for NASAL specimens only), is one component of a comprehensive MRSA colonization surveillance program. It is not intended to diagnose MRSA infection nor to guide or monitor treatment for MRSA infections. Test performance is not FDA approved in patients less than 45 years old. Performed at [facility], [address], [place],  [place] [address]     Time coordinating discharge: 40 mins  SIGNED:  Standley Dakins, MD  [facility] 01/30/2023, 1:05 PM How to contact the [facility] Attending or Consulting provider 7A - 7P or covering provider during after hours 7P -7A, for this patient?  Check the care team in [facility] and look for a) attending/consulting [facility] provider listed and b) the [facility] team listed Log into [email or web address] and use [place]'s universal password to access. If you do not have the password, please contact the hospital operator. Locate the [facility] provider you are looking for under [facility] and page to a number that you can be directly reached. If you still have difficulty reaching the provider, please page the [facility] (Director on Call) for the Hospitalists listed on amion for assistance.

## 2023-01-30 NOTE — Discharge Instructions (Signed)
IMPORTANT INFORMATION: PAY CLOSE ATTENTION  ? ?PHYSICIAN DISCHARGE INSTRUCTIONS ? ?Follow with Primary care provider  Karl Bales, NP  and other consultants as instructed by your Hospitalist Physician ? ?SEEK MEDICAL CARE OR RETURN TO EMERGENCY ROOM IF SYMPTOMS COME BACK, WORSEN OR NEW PROBLEM DEVELOPS  ? ?Please note: ?You were cared for by a hospitalist during your hospital stay. Every effort will be made to forward records to your primary care provider.  You can request that your primary care provider send for your hospital records if they have not received them.  Once you are discharged, your primary care physician will handle any further medical issues. Please note that NO REFILLS for any discharge medications will be authorized once you are discharged, as it is imperative that you return to your primary care physician (or establish a relationship with a primary care physician if you do not have one) for your post hospital discharge needs so that they can reassess your need for medications and monitor your lab values. ? ?Please get a complete blood count and chemistry panel checked by your Primary MD at your next visit, and again as instructed by your Primary MD. ? ?Get Medicines reviewed and adjusted: ?Please take all your medications with you for your next visit with your Primary MD ? ?Laboratory/radiological data: ?Please request your Primary MD to go over all hospital tests and procedure/radiological results at the follow up, please ask your primary care provider to get all Hospital records sent to his/her office. ? ?In some cases, they will be blood work, cultures and biopsy results pending at the time of your discharge. Please request that your primary care provider follow up on these results. ? ?If you are diabetic, please bring your blood sugar readings with you to your follow up appointment with primary care.   ? ?Please call and make your follow up appointments as soon as possible.   ? ?Also Note  the following: ?If you experience worsening of your admission symptoms, develop shortness of breath, life threatening emergency, suicidal or homicidal thoughts you must seek medical attention immediately by calling 911 or calling your MD immediately  if symptoms less severe. ? ?You must read complete instructions/literature along with all the possible adverse reactions/side effects for all the Medicines you take and that have been prescribed to you. Take any new Medicines after you have completely understood and accpet all the possible adverse reactions/side effects.  ? ?Do not drive when taking Pain medications or sleeping medications (Benzodiazepines) ? ?Do not take more than prescribed Pain, Sleep and Anxiety Medications. It is not advisable to combine anxiety,sleep and pain medications without talking with your primary care practitioner ? ?Special Instructions: If you have smoked or chewed Tobacco  in the last 2 yrs please stop smoking, stop any regular Alcohol  and or any Recreational drug use. ? ?Wear Seat belts while driving.  Do not drive if taking any narcotic, mind altering or controlled substances or recreational drugs or alcohol.  ? ? ? ? ? ?

## 2023-02-01 ENCOUNTER — Encounter (INDEPENDENT_AMBULATORY_CARE_PROVIDER_SITE_OTHER): Payer: Self-pay

## 2023-03-27 ENCOUNTER — Other Ambulatory Visit: Payer: Self-pay

## 2023-03-27 ENCOUNTER — Encounter (HOSPITAL_COMMUNITY): Payer: Self-pay | Admitting: Emergency Medicine

## 2023-03-27 ENCOUNTER — Emergency Department (HOSPITAL_COMMUNITY)

## 2023-03-27 ENCOUNTER — Inpatient Hospital Stay (HOSPITAL_COMMUNITY)
Admission: EM | Admit: 2023-03-27 | Discharge: 2023-03-30 | DRG: 871 | Disposition: A | Discharge status: Home / Self Care | Attending: Internal Medicine | Admitting: Internal Medicine

## 2023-03-27 DIAGNOSIS — A419 Sepsis, unspecified organism: Secondary | ICD-10-CM | POA: Diagnosis present

## 2023-03-27 DIAGNOSIS — Z7951 Long term (current) use of inhaled steroids: Secondary | ICD-10-CM | POA: Diagnosis not present

## 2023-03-27 DIAGNOSIS — Z91018 Allergy to other foods: Secondary | ICD-10-CM

## 2023-03-27 DIAGNOSIS — Z91148 Patient's other noncompliance with medication regimen for other reason: Secondary | ICD-10-CM

## 2023-03-27 DIAGNOSIS — Z1152 Encounter for screening for COVID-19: Secondary | ICD-10-CM | POA: Diagnosis not present

## 2023-03-27 DIAGNOSIS — Z90710 Acquired absence of both cervix and uterus: Secondary | ICD-10-CM | POA: Diagnosis not present

## 2023-03-27 DIAGNOSIS — Z7901 Long term (current) use of anticoagulants: Secondary | ICD-10-CM | POA: Diagnosis not present

## 2023-03-27 DIAGNOSIS — Z888 Allergy status to other drugs, medicaments and biological substances status: Secondary | ICD-10-CM | POA: Diagnosis not present

## 2023-03-27 DIAGNOSIS — J189 Pneumonia, unspecified organism: Secondary | ICD-10-CM | POA: Diagnosis present

## 2023-03-27 DIAGNOSIS — J9621 Acute and chronic respiratory failure with hypoxia: Principal | ICD-10-CM | POA: Diagnosis present

## 2023-03-27 DIAGNOSIS — I4819 Other persistent atrial fibrillation: Secondary | ICD-10-CM | POA: Diagnosis present

## 2023-03-27 DIAGNOSIS — E039 Hypothyroidism, unspecified: Secondary | ICD-10-CM | POA: Diagnosis present

## 2023-03-27 DIAGNOSIS — F32A Depression, unspecified: Secondary | ICD-10-CM | POA: Diagnosis present

## 2023-03-27 DIAGNOSIS — Z79890 Hormone replacement therapy (postmenopausal): Secondary | ICD-10-CM | POA: Diagnosis not present

## 2023-03-27 DIAGNOSIS — J440 Chronic obstructive pulmonary disease with acute lower respiratory infection: Secondary | ICD-10-CM | POA: Diagnosis present

## 2023-03-27 DIAGNOSIS — J441 Chronic obstructive pulmonary disease with (acute) exacerbation: Secondary | ICD-10-CM | POA: Diagnosis present

## 2023-03-27 DIAGNOSIS — K219 Gastro-esophageal reflux disease without esophagitis: Secondary | ICD-10-CM | POA: Diagnosis present

## 2023-03-27 DIAGNOSIS — Z79899 Other long term (current) drug therapy: Secondary | ICD-10-CM

## 2023-03-27 DIAGNOSIS — J9622 Acute and chronic respiratory failure with hypercapnia: Secondary | ICD-10-CM | POA: Diagnosis present

## 2023-03-27 DIAGNOSIS — I5032 Chronic diastolic (congestive) heart failure: Secondary | ICD-10-CM | POA: Diagnosis present

## 2023-03-27 DIAGNOSIS — Z87891 Personal history of nicotine dependence: Secondary | ICD-10-CM | POA: Diagnosis not present

## 2023-03-27 DIAGNOSIS — I110 Hypertensive heart disease with heart failure: Secondary | ICD-10-CM | POA: Diagnosis present

## 2023-03-27 DIAGNOSIS — E119 Type 2 diabetes mellitus without complications: Secondary | ICD-10-CM | POA: Diagnosis present

## 2023-03-27 DIAGNOSIS — F419 Anxiety disorder, unspecified: Secondary | ICD-10-CM | POA: Diagnosis present

## 2023-03-27 LAB — RESP PANEL BY RT-PCR (RSV, FLU A&B, COVID)  RVPGX2
Influenza A by PCR: NEGATIVE
Influenza B by PCR: NEGATIVE
Resp Syncytial Virus by PCR: NEGATIVE
SARS Coronavirus 2 by RT PCR: NEGATIVE

## 2023-03-27 LAB — CBC WITH DIFFERENTIAL/PLATELET
Abs Immature Granulocytes: 0 10*3/uL (ref 0.00–0.07)
Band Neutrophils: 3 %
Basophils Absolute: 0.1 10*3/uL (ref 0.0–0.1)
Basophils Relative: 1 %
Eosinophils Absolute: 0 10*3/uL (ref 0.0–0.5)
Eosinophils Relative: 0 %
HCT: 40.5 % (ref 36.0–46.0)
Hemoglobin: 11.9 g/dL — ABNORMAL LOW (ref 12.0–15.0)
Lymphocytes Relative: 13 %
Lymphs Abs: 1.5 10*3/uL (ref 0.7–4.0)
MCH: 30.5 pg (ref 26.0–34.0)
MCHC: 29.4 g/dL — ABNORMAL LOW (ref 30.0–36.0)
MCV: 103.8 fL — ABNORMAL HIGH (ref 80.0–100.0)
Monocytes Absolute: 0.5 10*3/uL (ref 0.1–1.0)
Monocytes Relative: 4 %
Neutro Abs: 9.5 10*3/uL — ABNORMAL HIGH (ref 1.7–7.7)
Neutrophils Relative %: 79 %
Platelets: 126 10*3/uL — ABNORMAL LOW (ref 150–400)
RBC: 3.9 MIL/uL (ref 3.87–5.11)
RDW: 13.2 % (ref 11.5–15.5)
WBC: 11.6 10*3/uL — ABNORMAL HIGH (ref 4.0–10.5)
nRBC: 0 % (ref 0.0–0.2)

## 2023-03-27 LAB — LACTIC ACID, PLASMA
Lactic Acid, Venous: 1.4 mmol/L (ref 0.5–1.9)
Lactic Acid, Venous: 1.4 mmol/L (ref 0.5–1.9)

## 2023-03-27 LAB — COMPREHENSIVE METABOLIC PANEL
ALT: 9 U/L (ref 0–44)
AST: 15 U/L (ref 15–41)
Albumin: 3.6 g/dL (ref 3.5–5.0)
Alkaline Phosphatase: 90 U/L (ref 38–126)
Anion gap: 12 (ref 5–15)
BUN: 16 mg/dL (ref 8–23)
CO2: 31 mmol/L (ref 22–32)
Calcium: 9.6 mg/dL (ref 8.9–10.3)
Chloride: 98 mmol/L (ref 98–111)
Creatinine, Ser: 0.62 mg/dL (ref 0.44–1.00)
GFR, Estimated: >60 mL/min (ref >60)
Glucose, Bld: 150 mg/dL — ABNORMAL HIGH (ref 70–99)
Potassium: 3.8 mmol/L (ref 3.5–5.1)
Sodium: 141 mmol/L (ref 135–145)
Total Bilirubin: 0.8 mg/dL (ref 0.0–1.2)
Total Protein: 7.2 g/dL (ref 6.5–8.1)

## 2023-03-27 LAB — BLOOD GAS, VENOUS
Acid-Base Excess: 7.6 mmol/L — ABNORMAL HIGH (ref 0.0–2.0)
Bicarbonate: 36.9 mmol/L — ABNORMAL HIGH (ref 20.0–28.0)
Drawn by: 442
O2 Saturation: 87.2 %
Patient temperature: 37.1
pCO2, Ven: 75 mmHg (ref 44–60)
pH, Ven: 7.3 (ref 7.25–7.43)
pO2, Ven: 53 mmHg — ABNORMAL HIGH (ref 32–45)

## 2023-03-27 LAB — APTT: aPTT: 25 s (ref 24–36)

## 2023-03-27 LAB — PROTIME-INR
INR: 1.1 (ref 0.8–1.2)
Prothrombin Time: 14.4 s (ref 11.4–15.2)

## 2023-03-27 MED ORDER — IPRATROPIUM-ALBUTEROL 0.5-2.5 (3) MG/3ML IN SOLN
3.0000 mL | Freq: Four times a day (QID) | RESPIRATORY_TRACT | Status: DC
  Administered 2023-03-27 (×2): 3 mL via RESPIRATORY_TRACT
  Filled 2023-03-27 (×2): qty 3

## 2023-03-27 MED ORDER — ALBUTEROL SULFATE (2.5 MG/3ML) 0.083% IN NEBU: 2.5000 mg | INHALATION_SOLUTION | RESPIRATORY_TRACT | Status: DC | PRN

## 2023-03-27 MED ORDER — RIVAROXABAN 20 MG PO TABS
20.0000 mg | ORAL_TABLET | Freq: Every day | ORAL | Status: DC
  Administered 2023-03-27 – 2023-03-30 (×4): 20 mg via ORAL
  Filled 2023-03-27 (×4): qty 1

## 2023-03-27 MED ORDER — SODIUM CHLORIDE 0.9 % IV SOLN
500.0000 mg | Freq: Once | INTRAVENOUS | Status: AC
  Administered 2023-03-27: 500 mg via INTRAVENOUS
  Filled 2023-03-27 (×2): qty 5

## 2023-03-27 MED ORDER — SODIUM CHLORIDE 0.9 % IV SOLN
500.0000 mg | INTRAVENOUS | Status: DC
  Administered 2023-03-28 – 2023-03-30 (×3): 500 mg via INTRAVENOUS
  Filled 2023-03-27 (×4): qty 5

## 2023-03-27 MED ORDER — ACETAMINOPHEN 650 MG RE SUPP: 650.0000 mg | Freq: Four times a day (QID) | RECTAL | Status: DC | PRN

## 2023-03-27 MED ORDER — DM-GUAIFENESIN ER 30-600 MG PO TB12
1.0000 | ORAL_TABLET | Freq: Two times a day (BID) | ORAL | Status: DC
  Administered 2023-03-27 – 2023-03-30 (×7): 1 via ORAL
  Filled 2023-03-27 (×7): qty 1

## 2023-03-27 MED ORDER — BISACODYL 10 MG RE SUPP: 10.0000 mg | Freq: Every day | RECTAL | Status: DC | PRN

## 2023-03-27 MED ORDER — SODIUM CHLORIDE 0.9 % IV SOLN: INTRAVENOUS | Status: AC | PRN

## 2023-03-27 MED ORDER — METHYLPREDNISOLONE SODIUM SUCC 125 MG IJ SOLR
125.0000 mg | Freq: Once | INTRAMUSCULAR | Status: AC
  Administered 2023-03-27: 125 mg via INTRAVENOUS
  Filled 2023-03-27: qty 2

## 2023-03-27 MED ORDER — FOLIC ACID 1 MG PO TABS
1.0000 mg | ORAL_TABLET | Freq: Every day | ORAL | Status: DC
  Administered 2023-03-27 – 2023-03-30 (×4): 1 mg via ORAL
  Filled 2023-03-27 (×4): qty 1

## 2023-03-27 MED ORDER — SODIUM CHLORIDE 0.9 % IV SOLN: INTRAVENOUS | Status: AC

## 2023-03-27 MED ORDER — POLYETHYLENE GLYCOL 3350 17 G PO PACK: 17.0000 g | PACK | Freq: Every day | ORAL | Status: DC | PRN

## 2023-03-27 MED ORDER — ONDANSETRON HCL 4 MG PO TABS: 4.0000 mg | ORAL_TABLET | Freq: Four times a day (QID) | ORAL | Status: DC | PRN

## 2023-03-27 MED ORDER — METHYLPREDNISOLONE SODIUM SUCC 40 MG IJ SOLR
40.0000 mg | Freq: Two times a day (BID) | INTRAMUSCULAR | Status: DC
  Administered 2023-03-27 – 2023-03-30 (×6): 40 mg via INTRAVENOUS
  Filled 2023-03-27 (×6): qty 1

## 2023-03-27 MED ORDER — METOPROLOL TARTRATE 25 MG PO TABS
12.5000 mg | ORAL_TABLET | Freq: Two times a day (BID) | ORAL | Status: DC
  Administered 2023-03-27 – 2023-03-30 (×6): 12.5 mg via ORAL
  Filled 2023-03-27 (×7): qty 1

## 2023-03-27 MED ORDER — ATORVASTATIN CALCIUM 20 MG PO TABS
20.0000 mg | ORAL_TABLET | Freq: Every day | ORAL | Status: DC
  Administered 2023-03-27 – 2023-03-30 (×3): 20 mg via ORAL
  Filled 2023-03-27 (×3): qty 1

## 2023-03-27 MED ORDER — TRAZODONE HCL 50 MG PO TABS: 50.0000 mg | ORAL_TABLET | Freq: Every evening | ORAL | Status: DC | PRN

## 2023-03-27 MED ORDER — LEVALBUTEROL HCL 0.63 MG/3ML IN NEBU
0.6300 mg | INHALATION_SOLUTION | Freq: Four times a day (QID) | RESPIRATORY_TRACT | Status: DC
  Administered 2023-03-28 (×3): 0.63 mg via RESPIRATORY_TRACT
  Filled 2023-03-27 (×3): qty 3

## 2023-03-27 MED ORDER — SODIUM CHLORIDE 0.9% FLUSH: 3.0000 mL | INTRAVENOUS | Status: DC | PRN

## 2023-03-27 MED ORDER — ALBUTEROL SULFATE (2.5 MG/3ML) 0.083% IN NEBU
3.0000 mL | INHALATION_SOLUTION | Freq: Once | RESPIRATORY_TRACT | Status: AC
  Administered 2023-03-27: 3 mL via RESPIRATORY_TRACT
  Filled 2023-03-27: qty 3

## 2023-03-27 MED ORDER — ONDANSETRON HCL 4 MG/2ML IJ SOLN
4.0000 mg | Freq: Four times a day (QID) | INTRAMUSCULAR | Status: DC | PRN
Start: 2023-03-27 — End: 2023-03-30

## 2023-03-27 MED ORDER — PANTOPRAZOLE SODIUM 40 MG PO TBEC
40.0000 mg | DELAYED_RELEASE_TABLET | Freq: Every day | ORAL | Status: DC
  Administered 2023-03-27 – 2023-03-30 (×4): 40 mg via ORAL
  Filled 2023-03-27 (×4): qty 1

## 2023-03-27 MED ORDER — ACETAMINOPHEN 325 MG PO TABS
650.0000 mg | ORAL_TABLET | Freq: Four times a day (QID) | ORAL | Status: DC | PRN
  Administered 2023-03-28 – 2023-03-30 (×2): 650 mg via ORAL
  Filled 2023-03-27 (×2): qty 2

## 2023-03-27 MED ORDER — SODIUM CHLORIDE 0.9% FLUSH
3.0000 mL | Freq: Two times a day (BID) | INTRAVENOUS | Status: DC
  Administered 2023-03-27 – 2023-03-30 (×7): 3 mL via INTRAVENOUS

## 2023-03-27 MED ORDER — LEVOTHYROXINE SODIUM 50 MCG PO TABS
200.0000 ug | ORAL_TABLET | Freq: Every day | ORAL | Status: DC
Start: 2023-03-28 — End: 2023-03-30
  Administered 2023-03-28 – 2023-03-30 (×3): 200 ug via ORAL
  Filled 2023-03-27 (×3): qty 4

## 2023-03-27 MED ORDER — TOPIRAMATE 25 MG PO TABS
100.0000 mg | ORAL_TABLET | Freq: Two times a day (BID) | ORAL | Status: DC
  Administered 2023-03-27 – 2023-03-30 (×6): 100 mg via ORAL
  Filled 2023-03-27 (×6): qty 4

## 2023-03-27 MED ORDER — SODIUM CHLORIDE 0.9 % IV SOLN
2.0000 g | Freq: Once | INTRAVENOUS | Status: AC
  Administered 2023-03-27: 2 g via INTRAVENOUS
  Filled 2023-03-27: qty 20

## 2023-03-27 MED ORDER — SODIUM CHLORIDE 0.9 % IV SOLN
2.0000 g | INTRAVENOUS | Status: DC
  Administered 2023-03-28 – 2023-03-30 (×3): 2 g via INTRAVENOUS
  Filled 2023-03-27 (×3): qty 20

## 2023-03-27 MED ORDER — SODIUM CHLORIDE 0.9% FLUSH
3.0000 mL | Freq: Two times a day (BID) | INTRAVENOUS | Status: DC
  Administered 2023-03-27 – 2023-03-30 (×6): 3 mL via INTRAVENOUS

## 2023-03-27 MED ORDER — ESCITALOPRAM OXALATE 10 MG PO TABS
10.0000 mg | ORAL_TABLET | Freq: Every day | ORAL | Status: DC
  Administered 2023-03-27 – 2023-03-30 (×4): 10 mg via ORAL
  Filled 2023-03-27 (×4): qty 1

## 2023-03-27 NOTE — ED Notes (Signed)
 Dr. Rhunette Croft took patient off bipap. Trial run on 5L o2 [place].

## 2023-03-27 NOTE — Progress Notes (Signed)
 Elink is following code sepsis.

## 2023-03-27 NOTE — ED Provider Notes (Signed)
 [place] EMERGENCY DEPARTMENT AT [facility] Provider Note   CSN: [id number] Arrival date & time: 03/27/23  1015     History {Add pertinent medical, surgical, social history, OB history to HPI:1} Chief Complaint  Patient presents with  . Shortness of Breath    Terri Casey is a 63 y.o. female.  HPI    63 y.o. female with medical history significant of COPD with chronic respiratory failure with hypoxia on supplemental oxygen at 3 LPM via [place], chronic diastolic CHF, hypothyroidism, PAF on Xarelto, depression who presents to the emergency department due to increasing shortness of breath.  Patient arrives to the ER via EMS.  Per EMS, patient recently was treated for UTI.  She started having shortness of breath about 4 days ago.  When EMS arrived, patient was found to have O2 sats in the mid 80s.  She was having respiratory distress, therefore she was placed on BiPAP.  She also received DuoNeb prior to ED arrival.  Patient indicates that she has been having cough, shortness of breath over the past few days.  Everyone in her house is coughing.  She denies any fevers, but admits to having some chills.  She has been taking multiple breathing treatments without any relief.  Home Medications Prior to Admission medications   Medication Sig Start Date End Date Taking? Authorizing Provider  levothyroxine (SYNTHROID) 200 MCG tablet Take 1 tablet (200 mcg total) by mouth daily at 6 (six) AM. 06/20/19  Yes Swayze, Ava, DO  acetaminophen (TYLENOL) 500 MG tablet Take 500 mg by mouth every 6 (six) hours as needed for mild pain (pain score 1-3).    [provider]  albuterol (PROVENTIL) (2.5 MG/3ML) 0.083% nebulizer solution Take 3 mLs (2.5 mg total) by nebulization every 6 (six) hours as needed for wheezing or shortness of breath. 01/30/23   Laural Benes, Clanford L, MD  albuterol (VENTOLIN HFA) 108 (90 Base) MCG/ACT inhaler Inhale 3 puffs into the lungs every 6 (six) hours as needed  for wheezing or shortness of breath. 01/30/23   Johnson, Clanford L, MD  atorvastatin (LIPITOR) 20 MG tablet Take 1 tablet (20 mg total) by mouth at bedtime. 01/07/23   Shahmehdi, Gemma Payor, MD  dextromethorphan-guaiFENesin (MUCINEX DM) 30-600 MG 12hr tablet Take 1 tablet by mouth 2 (two) times daily as needed for cough. 01/30/23   Johnson, Clanford L, MD  escitalopram (LEXAPRO) 10 MG tablet Take 1 tablet (10 mg total) by mouth daily. 01/07/23   Shahmehdi, Gemma Payor, MD  fluticasone (FLOVENT HFA) 110 MCG/ACT inhaler Inhale 1 puff into the lungs daily. 01/30/23   Johnson, Clanford L, MD  folic acid (FOLVITE) 1 MG tablet Take 1 tablet (1 mg total) by mouth daily. 10/11/19   Catarina Hartshorn, MD  loratadine (CLARITIN) 10 MG tablet Take 10 mg by mouth at bedtime. 08/04/22   [provider]  metoprolol tartrate (LOPRESSOR) 25 MG tablet Take 12.5 mg by mouth 2 (two) times daily. 08/04/22   [provider]  pantoprazole (PROTONIX) 40 MG tablet Take 1 tablet (40 mg total) by mouth daily. 06/20/19   Swayze, Ava, DO  predniSONE (DELTASONE) 20 MG tablet Take 3 PO QAM x3days, 2 PO QAM x3days, 1 PO QAM x3days 01/31/23   Johnson, Clanford L, MD  SUMAtriptan (IMITREX) 50 MG tablet Take 50 mg by mouth every 2 (two) hours as needed for migraine. 07/31/21   [provider]  topiramate (TOPAMAX) 100 MG tablet Take 100 mg by mouth 2 (two)  times daily. 06/01/20   [provider]  XARELTO 20 MG TABS tablet Take 20 mg by mouth daily. 05/08/19   [provider]      Allergies    Estrogens, Mustard, and Strawberry extract    Review of Systems   Review of Systems  All other systems reviewed and are negative.   Physical Exam Updated Vital Signs BP 110/67   Pulse (!) 115   Temp 99.7 F (37.6 C)   Resp (!) 24   Ht 5\' 9"  (1.753 m)   Wt 80.7 kg   SpO2 95%   BMI 26.29 kg/m  Physical Exam Vitals and nursing note reviewed.  Constitutional:      Appearance: She is well-developed. She is  ill-appearing. She is not toxic-appearing.  HENT:     Head: Atraumatic.  Cardiovascular:     Rate and Rhythm: Normal rate.     Comments: Poor aeration diffusely Pulmonary:     Effort: Pulmonary effort is normal. Tachypnea present.     Breath sounds: Decreased breath sounds present. No rales.  Musculoskeletal:     Cervical back: Normal range of motion and neck supple.  Skin:    General: Skin is warm and dry.  Neurological:     Mental Status: She is alert and oriented to person, place, and time.    ED Results / Procedures / Treatments   Labs (all labs ordered are listed, but only abnormal results are displayed) Labs Reviewed  COMPREHENSIVE METABOLIC PANEL - Abnormal; Notable for the following components:      Result Value   Glucose, Bld 150 (*)    All other components within normal limits  CBC WITH DIFFERENTIAL/PLATELET - Abnormal; Notable for the following components:   WBC 11.6 (*)    Hemoglobin 11.9 (*)    MCV 103.8 (*)    MCHC 29.4 (*)    Platelets 126 (*)    Neutro Abs 9.5 (*)    All other components within normal limits  CULTURE, BLOOD (ROUTINE X 2)  CULTURE, BLOOD (ROUTINE X 2)  URINE CULTURE  RESP PANEL BY RT-PCR (RSV, FLU A&B, COVID)  RVPGX2  LACTIC ACID, PLASMA  PROTIME-INR  APTT  LACTIC ACID, PLASMA  URINALYSIS, ROUTINE W REFLEX MICROSCOPIC  BLOOD GAS, VENOUS    EKG None  Radiology DG Chest Port 1 View Result Date: 03/27/2023 CLINICAL DATA:  Shortness of breath, fever. EXAM: PORTABLE CHEST 1 VIEW COMPARISON:  January 28, 2023. FINDINGS: Stable cardiomediastinal silhouette. Right lung is unremarkable. Mildly increased left midlung opacity is noted concerning for focal inflammation or pneumonia. Bony thorax is unremarkable. IMPRESSION: Mildly increased left midlung opacity is noted concerning for possible pneumonia. Followup PA and lateral chest X-ray is recommended in 3-4 weeks following trial of antibiotic therapy to ensure resolution and exclude underlying  malignancy. Electronically Signed   By: Lupita Raider M.D.   On: 03/27/2023 11:19    Procedures .Critical Care  Performed by: Derwood Kaplan, MD Authorized by: Derwood Kaplan, MD   Critical care provider statement:    Critical care time (minutes):  48   Critical care was necessary to treat or prevent imminent or life-threatening deterioration of the following conditions:  Respiratory failure   Critical care was time spent personally by me on the following activities:  Development of treatment plan with patient or surrogate, discussions with consultants, evaluation of patient's response to treatment, examination of patient, ordering and review of laboratory studies, ordering and review of radiographic studies, ordering and  performing treatments and interventions, pulse oximetry, re-evaluation of patient's condition, review of old charts and obtaining history from patient or surrogate   {Document cardiac monitor, telemetry assessment procedure when appropriate:1}  Medications Ordered in ED Medications  cefTRIAXone (ROCEPHIN) 2 g in sodium chloride 0.9 % 100 mL IVPB (has no administration in time range)  azithromycin (ZITHROMAX) 500 mg in sodium chloride 0.9 % 250 mL IVPB (has no administration in time range)  albuterol (PROVENTIL) (2.5 MG/3ML) 0.083% nebulizer solution 3 mL (3 mLs Nebulization Given 03/27/23 1102)  methylPREDNISolone sodium succinate (SOLU-MEDROL) 125 mg/2 mL injection 125 mg (125 mg Intravenous Given 03/27/23 1105)    ED Course/ Medical Decision Making/ A&P   {   Click here for ABCD2, HEART and other calculatorsREFRESH Note before signing :1}                              Medical Decision Making Amount and/or Complexity of Data Reviewed Labs: ordered. Radiology: ordered.  Risk Prescription drug management. Decision regarding hospitalization.   This patient presents to the ED with chief complaint(s) of worsening shortness of breath and cough with pertinent past  medical history of COPD, CHF, A-fib.The complaint involves an extensive differential diagnosis and also carries with it a high risk of complications and morbidity.    The differential diagnosis includes : COPD exacerbation, CHF exacerbation, flu, bacterial pneumonia, symptomatic A-fib.  The initial plan is to get basic labs.  We will continue DuoNebs treatment.  Patient is on BiPAP at this time. After treatments, will reassess to see if we can wean her off of BiPAP.   Additional history obtained: Additional history obtained from EMS  Records reviewed previous admission documents  Independent labs interpretation:  The following labs were independently interpreted: Patient's white count is slightly elevated at 11.6.  Lactate is reassuring.  Electrolytes are normal.  Independent visualization and interpretation of imaging: - I independently visualized the following imaging with scope of interpretation limited to determining acute life threatening conditions related to emergency care: X-ray of the chest, which revealed no evidence of pneumothorax.  Patient's left side does appear to have increased opacity.  There was some opacity in the past as well.  Radiology is also concerned that patient could be having evolving pneumonia.  We will start antibiotics.  Sepsis workup initiated.  Treatment and Reassessment: ***  Consultation: - Consulted or discussed management/test interpretation with external professional: ***  Consideration for admission or further workup:  Social Determinants of health:   Final Clinical Impression(s) / ED Diagnoses Final diagnoses:  Acute on chronic hypoxic respiratory failure (HCC)  Community acquired pneumonia of left lung, unspecified part of lung    Rx / DC Orders ED Discharge Orders     None

## 2023-03-27 NOTE — H&P (Signed)
 History and Physical    Patient: Terri Casey ONG:[id number] DOB: 1960/07/23 DOA: 03/27/2023 DOS: the patient was seen and examined on 03/27/2023 PCP: Karl Bales, NP  Patient coming from: Home  Chief Complaint:  Chief Complaint  Patient presents with   Shortness of Breath   HPI: Terri Casey is a 63 y.o. female with medical history significant for COPD with chronic hypoxic and hypercapnic respiratory failure on supplemental oxygen at 3 LPM via [place] at home and uses CPAP nightly,, chronic diastolic CHF, hypothyroidism, PAF on Xarelto, depression/Anxiety and GERD presents to the ED with worsening cough, dyspnea and hypoxia since 03/23/2023. -No Chest pains, No palpitations no dizziness -Patient reports cough, congestion -Patient had fevers at home, no fever she -Patient was very tachypneic tachycardic with increased work of breathing in the ED -- Patient initially required continuous BiPAP and recurrent doses of bronchodilators in the ED -Currently weaned back down to oxygen at 10 L per nasal cannula - -in ED--chest x-ray shows Mildly increased left midlung opacity is noted concerning for possible pneumonia. -COVID, flu and RSV negative -VBG with a pH of 7.3, pCO2 75 similar to prior, pO2 53 bicarb 36.9 -WBC 11.6 -Lactic acid 1.4 repeat 1.4 -Hemoglobin is 11.9 higher than prior baseline, MCV 103, platelets 126 similar to prior baseline -Creatinine 0.62 potassium 3.8 LFTs not elevated -EKG Per EDP report without acute findings  Review of Systems: As mentioned in the history of present illness. All other systems reviewed and are negative. Past Medical History:  Diagnosis Date   Anxiety    Asthma    CHF (congestive heart failure) (HCC)    COPD (chronic obstructive pulmonary disease) (HCC)    Diabetes mellitus without complication (HCC)    Hypertension    Thyroid disease    Past Surgical History:  Procedure Laterality Date   ABDOMINAL HYSTERECTOMY     THORACENTESIS  Left 09/18/2021   Procedure: THORACENTESIS;  Surgeon: Lupita Leash, MD;  Location: [facility] ENDOSCOPY;  Service: Cardiopulmonary;  Laterality: Left;   Social History:  reports that she has quit smoking. Her smoking use included cigarettes. She has a 10 pack-year smoking history. She has never used smokeless tobacco. She reports that she does not drink alcohol and does not use drugs.  Allergies  Allergen Reactions   Estrogens Hives   Mustard Hives   Strawberry Extract Hives    History reviewed. No pertinent family history.  Prior to Admission medications   Medication Sig Start Date End Date Taking? Authorizing Provider  acetaminophen (TYLENOL) 500 MG tablet Take 500 mg by mouth every 6 (six) hours as needed for mild pain (pain score 1-3).   Yes [provider]  albuterol (PROVENTIL) (2.5 MG/3ML) 0.083% nebulizer solution Take 3 mLs (2.5 mg total) by nebulization every 6 (six) hours as needed for wheezing or shortness of breath. 01/30/23  Yes Johnson, Clanford L, MD  albuterol (VENTOLIN HFA) 108 (90 Base) MCG/ACT inhaler Inhale 3 puffs into the lungs every 6 (six) hours as needed for wheezing or shortness of breath. 01/30/23  Yes Johnson, Clanford L, MD  atorvastatin (LIPITOR) 20 MG tablet Take 1 tablet (20 mg total) by mouth at bedtime. 01/07/23  Yes Shahmehdi, Gemma Payor, MD  dextromethorphan-guaiFENesin (MUCINEX DM) 30-600 MG 12hr tablet Take 1 tablet by mouth 2 (two) times daily as needed for cough. 01/30/23  Yes Johnson, Clanford L, MD  escitalopram (LEXAPRO) 10 MG tablet Take 1 tablet (10 mg total) by mouth daily. 01/07/23  Yes Shahmehdi,  Seyed A, MD  fluticasone (FLOVENT HFA) 110 MCG/ACT inhaler Inhale 1 puff into the lungs daily. 01/30/23  Yes Johnson, Clanford L, MD  folic acid (FOLVITE) 1 MG tablet Take 1 tablet (1 mg total) by mouth daily. 10/11/19  Yes Tat, Onalee Hua, MD  levothyroxine (SYNTHROID) 200 MCG tablet Take 1 tablet (200 mcg total) by mouth daily at 6 (six) AM. 06/20/19  Yes  Swayze, Ava, DO  loratadine (CLARITIN) 10 MG tablet Take 10 mg by mouth at bedtime. 08/04/22  Yes [provider]  metoprolol tartrate (LOPRESSOR) 25 MG tablet Take 12.5 mg by mouth 2 (two) times daily. 08/04/22  Yes [provider]  pantoprazole (PROTONIX) 40 MG tablet Take 1 tablet (40 mg total) by mouth daily. 06/20/19  Yes Swayze, Ava, DO  SUMAtriptan (IMITREX) 50 MG tablet Take 50 mg by mouth every 2 (two) hours as needed for migraine. 07/31/21  Yes [provider]  topiramate (TOPAMAX) 100 MG tablet Take 100 mg by mouth 2 (two) times daily. 06/01/20  Yes [provider]  XARELTO 20 MG TABS tablet Take 20 mg by mouth daily. 05/08/19  Yes [provider]  predniSONE (DELTASONE) 20 MG tablet Take 3 PO QAM x3days, 2 PO QAM x3days, 1 PO QAM x3days Patient not taking: Reported on 03/27/2023 01/31/23   Cleora Fleet, MD    Physical Exam: Vitals:   03/27/23 1245 03/27/23 1307 03/27/23 1429 03/27/23 1621  BP:  (!) 102/59  103/60  Pulse: 97 (!) 110  82  Resp: (!) 23 20  (!) 21  Temp:      TempSrc:      SpO2: 97% 93% 90% 95%  Weight:  78.8 kg    Height:  5\' 9"  (1.753 m)      Physical Exam  Gen:- Awake Alert, respiratory distress with conversational dyspnea HEENT:- [place].AT, No sclera icterus Nose--BiPAP transition to nasal cannula 10  L of oxygen Neck-Supple Neck,No JVD,.  Lungs-diminished breath sounds with scattered rhonchi and wheezes bilaterally  CV- S1, S2 normal, RRR, tachycardic Abd-  +ve B.Sounds, Abd Soft, No tenderness,    Extremity/Skin:- No  edema,   good pedal pulses  Psych-affect is appropriate, oriented x3 Neuro-no new focal deficits, no tremors  Data Reviewed: -in ED--chest x-ray shows Mildly increased left midlung opacity is noted concerning for possible pneumonia. -COVID, flu and RSV negative -VBG with a pH of 7.3, pCO2 75 similar to prior, pO2 53 bicarb 36.9 -WBC 11.6 -Lactic acid 1.4 repeat 1.4 -Hemoglobin is 11.9 higher  than prior baseline, MCV 103, platelets 126 similar to prior baseline -Creatinine 0.62 potassium 3.8 LFTs not elevated -EKG Per EDP report without acute findings  Assessment and Plan: 1)Sepsis secondary to left-sided pneumonia--POA -Patient met sepsis criteria on admission with fevers at home, tachycardia, tachypnea and leukocytosis -Chest x-ray finding of left-sided pneumonia noted -COVID, flu and RSV negative -Continue Rocephin and azithromycin as well as bronchodilators and mucolytics as prescribed -Judicious IV fluids in view of history of CHF  2)Acute on chronic hypoxic and hypercapnic respiratory failure secondary to COPD exacerbation in the setting of left-sided pneumonia- VBG with a pH of 7.3, pCO2 75 similar to prior, pO2 53 bicarb 36.9 -at baseline prior to admission patient already uses 2 L of oxygen via nasal cannula -management as above #1 -IV Solu-Medrol added --Patient was very tachypneic tachycardic with increased work of breathing in the ED -- Patient initially required continuous BiPAP and recurrent doses of bronchodilators in the ED -Currently weaned back  down to oxygen at 10 L per nasal cannula  3)PAFib--- stable at this time, actually appears to be in sinus rhythm  -continue metoprolol for rate control -Continue Xarelto for stroke prophylaxis   4)Chronic diastolic dysfunction CHF--echo from 09/17/2021 with EF of 60 to 65% with grade 1 diastolic dysfunction, mild LVH -- Continue metoprolol, -Be judicious with IV fluids as noted above #1   5)Hypothyroidism--- continue Levothyroxine   6)GERD--- continue Protonix especially while on steroids   7)Depression/Anxiety--- stable, continue Lexapro   7)Tobacco Abuse----patient apparently quit smoking few weeks ago     Advance Care Planning:   Code Status: Full Code   Severity of Illness: The appropriate patient status for this patient is INPATIENT. Inpatient status is judged to be reasonable and necessary in order to  provide the required intensity of service to ensure the patient's safety. The patient's presenting symptoms, physical exam findings, and initial radiographic and laboratory data in the context of their chronic comorbidities is felt to place them at high risk for further clinical deterioration. Furthermore, it is not anticipated that the patient will be medically stable for discharge from the hospital within 2 midnights of admission.   * I certify that at the point of admission it is my clinical judgment that the patient will require inpatient hospital care spanning beyond 2 midnights from the point of admission due to high intensity of service, high risk for further deterioration and high frequency of surveillance required.*  Author: Shon Hale, MD 03/27/2023 5:59 PM  For on call review [email or web address].

## 2023-03-27 NOTE — Plan of Care (Signed)
  Problem: Education: Goal: Knowledge of General Education information will improve Description: Including pain rating scale, medication(s)/side effects and non-pharmacologic comfort measures Outcome: Progressing   Problem: Health Behavior/Discharge Planning: Goal: Ability to manage health-related needs will improve Outcome: Progressing   Problem: Clinical Measurements: Goal: Ability to maintain clinical measurements within normal limits will improve Outcome: Progressing   Problem: Coping: Goal: Level of anxiety will decrease Outcome: Progressing   Problem: Safety: Goal: Ability to remain free from injury will improve Outcome: Progressing   Problem: Skin Integrity: Goal: Risk for impaired skin integrity will decrease Outcome: Progressing

## 2023-03-27 NOTE — ED Triage Notes (Signed)
 Pt from home with family. Recently being treated for a UTI. Pt started having increased sob, fever since Saturday. Called EMS today, O2 sat at 87%. Placed on cpap, improved to 89%. LR given en route. Douneb given. CBG 130. Pt a/ox4

## 2023-03-27 NOTE — Progress Notes (Signed)
   03/27/23 1621  Vitals  BP 103/60  MAP (mmHg) 74  BP Location Right Arm  BP Method Automatic  Patient Position (if appropriate) Lying  Pulse Rate 82  Pulse Rate Source Dinamap  Resp (!) 21  Level of Consciousness  Level of Consciousness Alert  MEWS COLOR  MEWS Score Color Green  Oxygen Therapy  SpO2 95 %  O2 Device HFNC  O2 Flow Rate (L/min) 8 L/min  Pain Assessment  Pain Scale 0-10  Pain Score 0  MEWS Score  MEWS Temp 0  MEWS Systolic 0  MEWS Pulse 0  MEWS RR 1  MEWS LOC 0  MEWS Score 1   Pt sleeping when this nurse entered room. Easily awakened. Denies any c/o at present. States breathing feels much better than earlier today, resp shallow but unlabored. A&O x4. VSS. Pt advised to call for needs, call bell within reach and bed alarm on. Pt states understanding.

## 2023-03-27 NOTE — Progress Notes (Signed)
   03/27/23 2134  TOC Brief Assessment  Insurance and Status Reviewed  Patient has primary care physician Yes  Home environment has been reviewed From home c/son and his girlfiend.  Prior level of function: Assisted.  Prior/Current Home Services Current home services (PCA, O2)  Social Drivers of Health Review SDOH reviewed no interventions necessary  Readmission risk has been reviewed Yes  Transition of care needs transition of care needs identified, TOC will continue to follow   Pt states she prefers to dc home c/HH RN, PT. Has an aide and hm O2. Pt states that recently she has been significantly less active spending majority of her time in bed. Only getting up to go to the bathroom. Unclear if pt's aide is knowledgeable in caring for low mobility pt. TOC to follow.

## 2023-03-28 DIAGNOSIS — J441 Chronic obstructive pulmonary disease with (acute) exacerbation: Secondary | ICD-10-CM | POA: Diagnosis not present

## 2023-03-28 MED ORDER — LEVALBUTEROL HCL 0.63 MG/3ML IN NEBU
0.6300 mg | INHALATION_SOLUTION | Freq: Three times a day (TID) | RESPIRATORY_TRACT | Status: DC
  Administered 2023-03-29 – 2023-03-30 (×5): 0.63 mg via RESPIRATORY_TRACT
  Filled 2023-03-28 (×5): qty 3

## 2023-03-28 MED ORDER — LEVALBUTEROL HCL 0.63 MG/3ML IN NEBU: 0.6300 mg | INHALATION_SOLUTION | Freq: Four times a day (QID) | RESPIRATORY_TRACT | Status: DC | PRN

## 2023-03-28 NOTE — Plan of Care (Signed)
   Problem: Activity: Goal: Risk for activity intolerance will decrease Outcome: Progressing   Problem: Coping: Goal: Level of anxiety will decrease Outcome: Progressing

## 2023-03-28 NOTE — Plan of Care (Signed)

## 2023-03-28 NOTE — TOC Initial Note (Signed)
 Transition of Care [facility]) - Initial/Assessment Note   Patient Details  Name: Terri Casey MRN: [id number] Date of Birth: 11/20/1960  Transition of [facility]) CM/SW Contact:    Villa Herb, LCSWA Phone Number: 03/28/2023, 12:50 PM  Clinical Narrative:                 Pt is high risk for readmission. CSW spoke with pts daughter in law [name] to complete assessment. Pt lives with son and DIL. [name] is full time caregiver for pt. Pts DIL drives pt to appointments when needed. Pt has had HH in the past. Pt has a hospital bed, wheelchair, walker, BSC, NIV and O2 supplied through [employer]. Pts DIL is requesting to speak with someone at [employer] about the hospital bed pt has. CSW spoke to [place] who states she spoke with DIL and they will send someone out to replace the mattress for pt. TOC to follow.   Expected Discharge Plan: Home/Self Care Barriers to Discharge: Continued Medical Work up   Patient Goals and CMS Choice Patient states their goals for this hospitalization and ongoing recovery are:: return home CMS Medicare.gov Compare Post Acute Care list provided to:: Patient Represenative (must comment) Choice offered to / list presented to : Adult Children      Expected Discharge Plan and Services In-house Referral: Clinical Social Work Discharge Planning Services: CM Consult   Living arrangements for the past 2 months: Single Family Home                                      Prior Living Arrangements/Services Living arrangements for the past 2 months: Single Family Home Lives with:: Adult Children Patient language and need for interpreter reviewed:: Yes Do you feel safe going back to the place where you live?: Yes      Need for Family Participation in Patient Care: Yes (Comment) Care giver support system in place?: Yes (comment) Current home services: DME Criminal Activity/Legal Involvement Pertinent to Current Situation/Hospitalization: No - Comment as  needed  Activities of Daily Living   ADL Screening (condition at time of admission) Independently performs ADLs?: No Does the patient have a NEW difficulty with bathing/dressing/toileting/self-feeding that is expected to last >3 days?: No Does the patient have a NEW difficulty with getting in/out of bed, walking, or climbing stairs that is expected to last >3 days?: No Does the patient have a NEW difficulty with communication that is expected to last >3 days?: No Is the patient deaf or have difficulty hearing?: No Does the patient have difficulty seeing, even when wearing glasses/contacts?: Yes Does the patient have difficulty concentrating, remembering, or making decisions?: No  Permission Sought/Granted                  Emotional Assessment Appearance:: Appears stated age Attitude/Demeanor/Rapport: Engaged Affect (typically observed): Accepting   Alcohol / Substance Use: Not Applicable Psych Involvement: No (comment)  Admission diagnosis:  COPD with acute exacerbation (HCC) [J44.1] Community acquired pneumonia of left lung, unspecified part of lung [J18.9] Acute on chronic hypoxic respiratory failure (HCC) [J96.21] Patient Active Problem List   Diagnosis Date Noted   COPD with acute exacerbation (HCC) 03/27/2023   Sepsis due to Lt Sided Pneumonia (HCC) 03/27/2023   Hypoalbuminemia due to protein-calorie malnutrition (HCC) 01/29/2023   Elevated MCV 01/29/2023   Persistent atrial fibrillation (HCC) 01/29/2023   GERD (gastroesophageal reflux disease) 01/29/2023  Acute exacerbation of chronic obstructive pulmonary disease (COPD) (HCC) 01/28/2023   Generalized anxiety disorder 12/17/2021   Depression with anxiety 12/17/2021   Altered mental status 12/15/2021   Acute hypoxemic respiratory failure (HCC) 09/11/2021   Septic shock (HCC) 09/11/2021   Respiratory failure (HCC) 09/11/2021   Hashimoto's thyroiditis 09/05/2021   Chronic back pain greater than 3 months duration  09/05/2021   Allergic rhinitis 09/05/2021   Hypercapnia 09/05/2021   Osteopenia 09/05/2021   Periodic limb movements of sleep 09/05/2021   Suspicious nevus left buttock 03/19/2021   Acute hypercapnic respiratory failure (HCC) 03/16/2021   Hypothermia 03/16/2021   Chronic diastolic CHF (congestive heart failure) (HCC) 03/16/2021   Chronic respiratory failure with hypoxia and hypercapnia (HCC) 06/23/2020   Acute diastolic CHF (congestive heart failure) (HCC) 10/07/2019   Chronic low back pain    Encounter for intubation    Palliative care by specialist    Acute respiratory failure with hypoxia and hypercapnia (HCC) 10/05/2019   AF (paroxysmal atrial fibrillation) (HCC) 10/05/2019   Elevated brain natriuretic peptide (BNP) level 08/21/2019   Noncompliance with medication regimen 08/21/2019   Acute on chronic respiratory failure with hypoxia and hypercapnia (HCC) 06/09/2019   Obesity, Class III, BMI 40-49.9 (morbid obesity) (HCC) 06/09/2019   Thrombocytopenia (HCC) 06/09/2019   Nontoxic multinodular goiter 07/06/2015   Pericardial effusion: Per 2 d echo 03/26/2015 03/27/2015   Acute on chronic respiratory failure (HCC) 03/26/2015   PNA (pneumonia) 03/26/2015   Nocturnal hypoxia 03/25/2015   Acute respiratory failure with hypoxia (HCC) 03/25/2015   COPD exacerbation (HCC) 03/25/2015   COPD (chronic obstructive pulmonary disease) (HCC) 03/25/2015   Anxiety    Acquired hypothyroidism 03/03/2015   Vitamin D deficiency 03/03/2015   Hyperlipidemia 07/08/2007   Tobacco abuse 07/08/2007   Essential hypertension, benign 07/08/2007   EMPHYSEMA-on home O2 07/08/2007   Asthma 07/08/2007   COPD GOLD ? / still smoking 07/08/2007   Sleep apnea 07/08/2007   WEIGHT GAIN 07/08/2007   PCP:  Karl Bales, NP Pharmacy:   [facility] - [place], [place] - [address] [place] [address] Phone: [phone] Fax: [phone]     Social Drivers of Health  (SDOH) Social History: SDOH Screenings   Food Insecurity: No Food Insecurity (03/27/2023)  Housing: Low Risk  (03/27/2023)  Transportation Needs: No Transportation Needs (03/27/2023)  Utilities: Not At Risk (03/27/2023)  Tobacco Use: Medium Risk (03/27/2023)   SDOH Interventions:     Readmission Risk Interventions    03/28/2023   12:47 PM 01/04/2023    3:02 PM 12/31/2022    3:45 PM  Readmission Risk Prevention Plan  Transportation Screening Complete Complete Complete  Home Care Screening Complete Complete Complete  Medication Review (RN CM) Complete Complete Complete

## 2023-03-28 NOTE — Progress Notes (Signed)
 Chaplain visited Pt, as a responded to a request in the consult list, to provide information about AD form. Pt was alone in the room and received information. Pt will notify this office when the form is ready to be notarized.  Oneida Alar Chaplain Resident    03/28/23 1513  Spiritual Encounters  Type of Visit Initial  Care provided to: Patient  Referral source Clinical staff  Reason for visit Advance directives  OnCall Visit No

## 2023-03-28 NOTE — Progress Notes (Signed)
 PROGRESS NOTE  Terri Casey, is a 63 y.o. female, DOB - 11-11-1960, GNF:[id number]  Admit date - 03/27/2023   Admitting Physician Terri Siever Mariea Clonts, MD  Outpatient Primary MD for the patient is Terri Bales, NP  LOS - 1  Chief Complaint  Patient presents with   Shortness of Breath      Brief Narrative:  63 y.o. female with medical history significant for COPD with chronic hypoxic and hypercapnic respiratory failure on supplemental oxygen at 3 LPM via [place] at home and uses CPAP nightly,, chronic diastolic CHF, hypothyroidism, PAF on Xarelto, depression/Anxiety and GERD admitted on 03/27/2023 with sepsis secondary to left-sided pneumonia leading to acute on chronic hypoxic and hypercapnic respiratory failure    -Assessment and Plan: 1)Sepsis secondary to left-sided Pneumonia--POA -Chest x-ray finding of left-sided pneumonia noted -COVID, flu and RSV negative -Continue Rocephin and azithromycin as well as bronchodilators and mucolytics as prescribed -Judicious IV fluids in view of history of CHF   2)Acute on chronic hypoxic and hypercapnic respiratory failure secondary to COPD exacerbation in the setting of left-sided pneumonia- VBG with a pH of 7.3, pCO2 75 similar to prior, pO2 53 bicarb 36.9 -at baseline prior to admission patient already uses 2 L of oxygen via nasal cannula -management as above #1 -c/n IV Solu-Medrol 03/28/23 -Currently requiring 5 L of oxygen via nasal cannula  3)PAFib--- stable at this time, actually appears to be in sinus rhythm  -continue metoprolol for rate control -Continue Xarelto for stroke prophylaxis   4)Chronic diastolic dysfunction CHF--echo from 09/17/2021 with EF of 60 to 65% with grade 1 diastolic dysfunction, mild LVH -- Continue metoprolol, -Be judicious with IV fluids as noted above #1   5)Hypothyroidism--- continue Levothyroxine   6)GERD--- continue Protonix especially while on steroids   7)Depression/Anxiety--- stable, continue  Lexapro   7)Tobacco Abuse----patient apparently quit smoking few weeks ago  Status is: Inpatient   Disposition: The patient is from: Home              Anticipated d/c is to: Home              Anticipated d/c date is: 2 days              Patient currently is not medically stable to d/c. Barriers: Not Clinically Stable-   Code Status :  -  Code Status: Full Code   Family Communication:    NA (patient is alert, awake and coherent)   DVT Prophylaxis  :   - SCDs  SCDs Start: 03/27/23 1216 Place TED hose Start: 03/27/23 1216 rivaroxaban (XARELTO) tablet 20 mg   Lab Results  Component Value Date   PLT 126 (L) 03/27/2023   Inpatient Medications  Scheduled Meds:  atorvastatin  20 mg Oral QHS   dextromethorphan-guaiFENesin  1 tablet Oral BID   escitalopram  10 mg Oral Daily   folic acid  1 mg Oral Daily   levalbuterol  0.63 mg Nebulization Q6H   levothyroxine  200 mcg Oral Q0600   methylPREDNISolone (SOLU-MEDROL) injection  40 mg Intravenous Q12H   metoprolol tartrate  12.5 mg Oral BID   pantoprazole  40 mg Oral Daily   rivaroxaban  20 mg Oral Q supper   sodium chloride flush  3 mL Intravenous Q12H   sodium chloride flush  3 mL Intravenous Q12H   topiramate  100 mg Oral BID   Continuous Infusions:  sodium chloride 83 mL/hr at 03/28/23 1500   azithromycin Stopped (03/28/23 1246)   cefTRIAXone (  ROCEPHIN)  IV Stopped (03/28/23 1138)   PRN Meds:.acetaminophen **OR** acetaminophen, albuterol, bisacodyl, ondansetron **OR** ondansetron (ZOFRAN) IV, polyethylene glycol, sodium chloride flush, traZODone   Anti-infectives (From admission, onward)    Start     Dose/Rate Route Frequency Ordered Stop   03/28/23 1000  cefTRIAXone (ROCEPHIN) 2 g in sodium chloride 0.9 % 100 mL IVPB        2 g 200 mL/hr over 30 Minutes Intravenous Every 24 hours 03/27/23 1213     03/28/23 1000  azithromycin (ZITHROMAX) 500 mg in sodium chloride 0.9 % 250 mL IVPB        500 mg 250 mL/hr over 60 Minutes  Intravenous Every 24 hours 03/27/23 1213     03/27/23 1200  cefTRIAXone (ROCEPHIN) 2 g in sodium chloride 0.9 % 100 mL IVPB        2 g 200 mL/hr over 30 Minutes Intravenous Once 03/27/23 1154 03/27/23 1253   03/27/23 1200  azithromycin (ZITHROMAX) 500 mg in sodium chloride 0.9 % 250 mL IVPB        500 mg 250 mL/hr over 60 Minutes Intravenous  Once 03/27/23 1154 03/27/23 1654       Subjective: Terri Casey today has no fevers, no emesis,  No chest pain,   -Cough and dyspnea persist -Oxygen requirement remains above baseline  Objective: Vitals:   03/28/23 0934 03/28/23 1310 03/28/23 1312 03/28/23 1356  BP: (!) 103/48  (!) 101/55   Casey: 86 60 70   Resp: (!) 21     Temp:  98.4 F (36.9 C) 98.4 F (36.9 C)   TempSrc:   Oral   SpO2: 96%  100% 92%  Weight:      Height:        Intake/Output Summary (Last 24 hours) at 03/28/2023 1815 Last data filed at 03/28/2023 1500 Gross per 24 hour  Intake 2568.32 ml  Output --  Net 2568.32 ml   Filed Weights   03/27/23 1109 03/27/23 1307  Weight: 80.7 kg 78.8 kg    Physical Exam  Gen:- Awake Alert, conversational dyspnea HEENT:- [place].AT, No sclera icterus Nose- [name] 5L/min Neck-Supple Neck,No JVD,.  Lungs-diffuse breath sounds, scattered wheezes bilaterally  CV- S1, S2 normal, regular  Abd-  +ve B.Sounds, Abd Soft, No tenderness,    Extremity/Skin:- No  edema, pedal pulses present  Psych-affect is appropriate, oriented x3 Neuro-no new focal deficits, no tremors  Data Reviewed: I have personally reviewed following labs and imaging studies  CBC: Recent Labs  Lab 03/27/23 1025  WBC 11.6*  NEUTROABS 9.5*  HGB 11.9*  HCT 40.5  MCV 103.8*  PLT 126*   Basic Metabolic Panel: Recent Labs  Lab 03/27/23 1025  NA 141  K 3.8  CL 98  CO2 31  GLUCOSE 150*  BUN 16  CREATININE 0.62  CALCIUM 9.6   GFR: Estimated Creatinine Clearance: 74.2 mL/min (by C-G formula based on SCr of 0.62 mg/dL). Liver Function Tests: Recent Labs   Lab 03/27/23 1025  AST 15  ALT 9  ALKPHOS 90  BILITOT 0.8  PROT 7.2  ALBUMIN 3.6   Recent Results (from the past 240 hours)  Blood Culture (routine x 2)     Status: None (Preliminary result)   Collection Time: 03/27/23 10:25 AM   Specimen: BLOOD  Result Value Ref Range Status   Specimen Description BLOOD RIGHT HAND  Final   Special Requests   Final    BOTTLES DRAWN AEROBIC AND ANAEROBIC Blood Culture adequate volume  Culture   Final    NO GROWTH < 24 HOURS Performed at [facility], [address] [place], [place] [address]    Report Status PENDING  Incomplete  Resp panel by RT-PCR (RSV, Flu A&B, Covid) Anterior Nasal Swab     Status: None   Collection Time: 03/27/23 10:37 AM   Specimen: Anterior Nasal Swab  Result Value Ref Range Status   SARS Coronavirus 2 by RT PCR NEGATIVE NEGATIVE Final    Comment: (NOTE) SARS-CoV-2 target nucleic acids are NOT DETECTED.  The SARS-CoV-2 RNA is generally detectable in upper respiratory specimens during the acute phase of infection. The lowest concentration of SARS-CoV-2 viral copies this assay can detect is 138 copies/mL. A negative result does not preclude SARS-Cov-2 infection and should not be used as the sole basis for treatment or other patient management decisions. A negative result may occur with  improper specimen collection/handling, submission of specimen other than nasopharyngeal swab, presence of viral mutation(s) within the areas targeted by this assay, and inadequate number of viral copies(<138 copies/mL). A negative result must be combined with clinical observations, patient history, and epidemiological information. The expected result is Negative.  Fact Sheet for Patients:  BloggerCourse.com  Fact Sheet for Healthcare Providers:  SeriousBroker.it  This test is no t yet approved or cleared by the [place] FDA and  has been authorized for detection and/or  diagnosis of SARS-CoV-2 by FDA under an Emergency Use Authorization (EUA). This EUA will remain  in effect (meaning this test can be used) for the duration of the COVID-19 declaration under Section 564(b)(1) of the Act, 21 U.S.C.section 360bbb-3(b)(1), unless the authorization is terminated  or revoked sooner.       Influenza A by PCR NEGATIVE NEGATIVE Final   Influenza B by PCR NEGATIVE NEGATIVE Final    Comment: (NOTE) The Xpert Xpress SARS-CoV-2/FLU/RSV plus assay is intended as an aid in the diagnosis of influenza from Nasopharyngeal swab specimens and should not be used as a sole basis for treatment. Nasal washings and aspirates are unacceptable for Xpert Xpress SARS-CoV-2/FLU/RSV testing.  Fact Sheet for Patients: BloggerCourse.com  Fact Sheet for Healthcare Providers: SeriousBroker.it  This test is not yet approved or cleared by the [place] FDA and has been authorized for detection and/or diagnosis of SARS-CoV-2 by FDA under an Emergency Use Authorization (EUA). This EUA will remain in effect (meaning this test can be used) for the duration of the COVID-19 declaration under Section 564(b)(1) of the Act, 21 U.S.C. section 360bbb-3(b)(1), unless the authorization is terminated or revoked.     Resp Syncytial Virus by PCR NEGATIVE NEGATIVE Final    Comment: (NOTE) Fact Sheet for Patients: BloggerCourse.com  Fact Sheet for Healthcare Providers: SeriousBroker.it  This test is not yet approved or cleared by the [place] FDA and has been authorized for detection and/or diagnosis of SARS-CoV-2 by FDA under an Emergency Use Authorization (EUA). This EUA will remain in effect (meaning this test can be used) for the duration of the COVID-19 declaration under Section 564(b)(1) of the Act, 21 U.S.C. section 360bbb-3(b)(1), unless the authorization is terminated  or revoked.  Performed at [facility], [address] [place], [place] [address]   Blood Culture (routine x 2)     Status: None (Preliminary result)   Collection Time: 03/27/23 10:58 AM   Specimen: BLOOD  Result Value Ref Range Status   Specimen Description BLOOD BLOOD RIGHT ARM RAC  Final   Special Requests   Final  Blood Culture adequate volume BOTTLES DRAWN AEROBIC AND ANAEROBIC   Culture   Final    NO GROWTH < 24 HOURS Performed at [facility], [address], [place], [place] [address]    Report Status PENDING  Incomplete      Radiology Studies: DG Chest Port 1 View Result Date: 03/27/2023 CLINICAL DATA:  Shortness of breath, fever. EXAM: PORTABLE CHEST 1 VIEW COMPARISON:  January 28, 2023. FINDINGS: Stable cardiomediastinal silhouette. Right lung is unremarkable. Mildly increased left midlung opacity is noted concerning for focal inflammation or pneumonia. Bony thorax is unremarkable. IMPRESSION: Mildly increased left midlung opacity is noted concerning for possible pneumonia. Followup PA and lateral chest X-ray is recommended in 3-4 weeks following trial of antibiotic therapy to ensure resolution and exclude underlying malignancy. Electronically Signed   By: Lupita Raider M.D.   On: 03/27/2023 11:19   Scheduled Meds:  atorvastatin  20 mg Oral QHS   dextromethorphan-guaiFENesin  1 tablet Oral BID   escitalopram  10 mg Oral Daily   folic acid  1 mg Oral Daily   levalbuterol  0.63 mg Nebulization Q6H   levothyroxine  200 mcg Oral Q0600   methylPREDNISolone (SOLU-MEDROL) injection  40 mg Intravenous Q12H   metoprolol tartrate  12.5 mg Oral BID   pantoprazole  40 mg Oral Daily   rivaroxaban  20 mg Oral Q supper   sodium chloride flush  3 mL Intravenous Q12H   sodium chloride flush  3 mL Intravenous Q12H   topiramate  100 mg Oral BID   Continuous Infusions:  sodium chloride 83 mL/hr at 03/28/23 1500   azithromycin Stopped (03/28/23 1246)   cefTRIAXone (ROCEPHIN)  IV  Stopped (03/28/23 1138)    LOS: 1 day   Shon Hale M.D on 03/28/2023 at 6:15 PM  Go to [email or web address] - for contact info  [facility] - Office  [phone]  If 7PM-7AM, please contact night-coverage [email or web address] 03/28/2023, 6:15 PM

## 2023-03-28 NOTE — Progress Notes (Deleted)
 Chaplain responded to a request in the consult list, to provide information about how to fill the Advance Directives form. Pt received Chaplain, Pt's granddaughter at bedside. Both Pt and Pt's granddaughter received information about the form. Pt requested some time to fill the form with Pt's granddaughter's assistance. Chaplain asked Pt to notify this office when the form is ready to be notarized.Oneida Alar Chaplain Resident    03/28/23 1513  Spiritual Encounters  Type of Visit Initial  Care provided to: Patient  Referral source Clinical staff  Reason for visit Advance directives  OnCall Visit No

## 2023-03-29 DIAGNOSIS — J441 Chronic obstructive pulmonary disease with (acute) exacerbation: Secondary | ICD-10-CM | POA: Diagnosis not present

## 2023-03-29 LAB — BASIC METABOLIC PANEL WITH GFR
Anion gap: 4 — ABNORMAL LOW (ref 5–15)
BUN: 18 mg/dL (ref 8–23)
CO2: 32 mmol/L (ref 22–32)
Calcium: 9.3 mg/dL (ref 8.9–10.3)
Chloride: 105 mmol/L (ref 98–111)
Creatinine, Ser: 0.55 mg/dL (ref 0.44–1.00)
GFR, Estimated: >60 mL/min (ref >60)
Glucose, Bld: 126 mg/dL — ABNORMAL HIGH (ref 70–99)
Potassium: 4.2 mmol/L (ref 3.5–5.1)
Sodium: 141 mmol/L (ref 135–145)

## 2023-03-29 NOTE — Plan of Care (Signed)

## 2023-03-29 NOTE — Progress Notes (Signed)
 PROGRESS NOTE  Terri Casey, is a 63 y.o. female, DOB - 12-08-1960, XBJ:[id number]  Admit date - 03/27/2023   Admitting Physician Malaky Tetrault Mariea Clonts, MD  Outpatient Primary MD for the patient is Karl Bales, NP  LOS - 2  Chief Complaint  Patient presents with   Shortness of Breath      Brief Narrative:  63 y.o. female with medical history significant for COPD with chronic hypoxic and hypercapnic respiratory failure on supplemental oxygen at 3 LPM via [place] at home and uses CPAP nightly,, chronic diastolic CHF, hypothyroidism, PAF on Xarelto, depression/Anxiety and GERD admitted on 03/27/2023 with sepsis secondary to left-sided pneumonia leading to acute on chronic hypoxic and hypercapnic respiratory failure    -Assessment and Plan: 1)Sepsis secondary to left-sided Pneumonia--POA -Chest x-ray finding of left-sided pneumonia noted -COVID, flu and RSV negative -Continue Rocephin and azithromycin as well as bronchodilators and mucolytics as prescribed 03/29/23 -Cough , dyspnea and hypoxia improving -If continues to improve possible discharge on 03/30/2023   2)Acute on chronic hypoxic and hypercapnic respiratory failure secondary to COPD exacerbation in the setting of left-sided pneumonia- VBG with a pH of 7.3, pCO2 75 similar to prior, pO2 53 bicarb 36.9 -at baseline prior to admission patient already uses 2 L of oxygen via nasal cannula -management as above #1 -c/n IV Solu-Medrol 03/29/23 -Respiratory status and hypoxia improving, currently requiring 4 L of oxygen via nasal cannula -PTA patient uses 2  L of oxygen via nasal cannula  3)PAFib--- stable at this time, actually appears to be in sinus rhythm  -continue metoprolol for rate control -Continue Xarelto for stroke prophylaxis   4)Chronic diastolic dysfunction CHF--echo from 09/17/2021 with EF of 60 to 65% with grade 1 diastolic dysfunction, mild LVH -- Continue metoprolol,   5)Hypothyroidism--- continue Levothyroxine    6)GERD--- continue Protonix especially while on steroids   7)Depression/Anxiety--- stable, continue Lexapro   7)Tobacco Abuse----patient apparently quit smoking few weeks ago  Status is: Inpatient   Disposition: The patient is from: Home              Anticipated d/c is to: Home              Anticipated d/c date is: 1 day              Patient currently is not medically stable to d/c. Barriers: Not Clinically Stable-   Code Status :  -  Code Status: Full Code   Family Communication:    NA (patient is alert, awake and coherent)   DVT Prophylaxis  :   - SCDs  SCDs Start: 03/27/23 1216 Place TED hose Start: 03/27/23 1216 rivaroxaban (XARELTO) tablet 20 mg   Lab Results  Component Value Date   PLT 126 (L) 03/27/2023   Inpatient Medications  Scheduled Meds:  atorvastatin  20 mg Oral QHS   dextromethorphan-guaiFENesin  1 tablet Oral BID   escitalopram  10 mg Oral Daily   folic acid  1 mg Oral Daily   levalbuterol  0.63 mg Nebulization TID   levothyroxine  200 mcg Oral Q0600   methylPREDNISolone (SOLU-MEDROL) injection  40 mg Intravenous Q12H   metoprolol tartrate  12.5 mg Oral BID   pantoprazole  40 mg Oral Daily   rivaroxaban  20 mg Oral Q supper   sodium chloride flush  3 mL Intravenous Q12H   sodium chloride flush  3 mL Intravenous Q12H   topiramate  100 mg Oral BID   Continuous Infusions:  azithromycin 500 mg (  03/29/23 1151)   cefTRIAXone (ROCEPHIN)  IV 2 g (03/29/23 1022)   PRN Meds:.acetaminophen **OR** acetaminophen, bisacodyl, levalbuterol, ondansetron **OR** ondansetron (ZOFRAN) IV, polyethylene glycol, sodium chloride flush, traZODone   Anti-infectives (From admission, onward)    Start     Dose/Rate Route Frequency Ordered Stop   03/28/23 1000  cefTRIAXone (ROCEPHIN) 2 g in sodium chloride 0.9 % 100 mL IVPB        2 g 200 mL/hr over 30 Minutes Intravenous Every 24 hours 03/27/23 1213     03/28/23 1000  azithromycin (ZITHROMAX) 500 mg in sodium chloride 0.9  % 250 mL IVPB        500 mg 250 mL/hr over 60 Minutes Intravenous Every 24 hours 03/27/23 1213     03/27/23 1200  cefTRIAXone (ROCEPHIN) 2 g in sodium chloride 0.9 % 100 mL IVPB        2 g 200 mL/hr over 30 Minutes Intravenous Once 03/27/23 1154 03/27/23 1253   03/27/23 1200  azithromycin (ZITHROMAX) 500 mg in sodium chloride 0.9 % 250 mL IVPB        500 mg 250 mL/hr over 60 Minutes Intravenous  Once 03/27/23 1154 03/27/23 1654       Subjective: [name] today has no fevers, no emesis,  No chest pain,    -Cough and dyspnea improving slowly -Dyspnea on exertion still bothersome for her -Oxygen requirement still above her baseline--but improved from 5 L to 4 L -At baseline she uses 2 L per nasal cannula  Objective: Vitals:   03/29/23 0500 03/29/23 0902 03/29/23 1300 03/29/23 1330  BP:    123/84  Pulse:    84  Resp: 20     Temp:    98.6 F (37 C)  TempSrc:    Oral  SpO2:  97% 92% 98%  Weight:      Height:        Intake/Output Summary (Last 24 hours) at 03/29/2023 1721 Last data filed at 03/29/2023 1200 Gross per 24 hour  Intake 1110.16 ml  Output --  Net 1110.16 ml   Filed Weights   03/27/23 1109 03/27/23 1307  Weight: 80.7 kg 78.8 kg    Physical Exam  Gen:- Awake Alert, no acute distress  HEENT:- [place].AT, No sclera icterus Nose- [place] 4L/min Neck-Supple Neck,No JVD,.  Lungs-improving air movement, few  scattered wheezes bilaterally  CV- S1, S2 normal, regular  Abd-  +ve B.Sounds, Abd Soft, No tenderness,    Extremity/Skin:- No  edema, pedal pulses present  Psych-affect is appropriate, oriented x3 Neuro-no new focal deficits, no tremors  Data Reviewed: I have personally reviewed following labs and imaging studies  CBC: Recent Labs  Lab 03/27/23 1025  WBC 11.6*  NEUTROABS 9.5*  HGB 11.9*  HCT 40.5  MCV 103.8*  PLT 126*   Basic Metabolic Panel: Recent Labs  Lab 03/27/23 1025 03/29/23 0409  NA 141 141  K 3.8 4.2  CL 98 105  CO2 31 32  GLUCOSE  150* 126*  BUN 16 18  CREATININE 0.62 0.55  CALCIUM 9.6 9.3   GFR: Estimated Creatinine Clearance: 74.2 mL/min (by C-G formula based on SCr of 0.55 mg/dL). Liver Function Tests: Recent Labs  Lab 03/27/23 1025  AST 15  ALT 9  ALKPHOS 90  BILITOT 0.8  PROT 7.2  ALBUMIN 3.6   Recent Results (from the past 240 hours)  Blood Culture (routine x 2)     Status: None (Preliminary result)   Collection Time: 03/27/23 10:25 AM  Specimen: BLOOD  Result Value Ref Range Status   Specimen Description BLOOD RIGHT HAND  Final   Special Requests   Final    BOTTLES DRAWN AEROBIC AND ANAEROBIC Blood Culture adequate volume   Culture   Final    NO GROWTH 2 DAYS Performed at [facility], [address], [place], [place] [address]    Report Status PENDING  Incomplete  Resp panel by RT-PCR (RSV, Flu A&B, Covid) Anterior Nasal Swab     Status: None   Collection Time: 03/27/23 10:37 AM   Specimen: Anterior Nasal Swab  Result Value Ref Range Status   SARS Coronavirus 2 by RT PCR NEGATIVE NEGATIVE Final    Comment: (NOTE) SARS-CoV-2 target nucleic acids are NOT DETECTED.  The SARS-CoV-2 RNA is generally detectable in upper respiratory specimens during the acute phase of infection. The lowest concentration of SARS-CoV-2 viral copies this assay can detect is 138 copies/mL. A negative result does not preclude SARS-Cov-2 infection and should not be used as the sole basis for treatment or other patient management decisions. A negative result may occur with  improper specimen collection/handling, submission of specimen other than nasopharyngeal swab, presence of viral mutation(s) within the areas targeted by this assay, and inadequate number of viral copies(<138 copies/mL). A negative result must be combined with clinical observations, patient history, and epidemiological information. The expected result is Negative.  Fact Sheet for Patients:  BloggerCourse.com  Fact  Sheet for Healthcare Providers:  SeriousBroker.it  This test is no t yet approved or cleared by the [place] FDA and  has been authorized for detection and/or diagnosis of SARS-CoV-2 by FDA under an Emergency Use Authorization (EUA). This EUA will remain  in effect (meaning this test can be used) for the duration of the COVID-19 declaration under Section 564(b)(1) of the Act, 21 U.S.C.section 360bbb-3(b)(1), unless the authorization is terminated  or revoked sooner.       Influenza A by PCR NEGATIVE NEGATIVE Final   Influenza B by PCR NEGATIVE NEGATIVE Final    Comment: (NOTE) The Xpert Xpress SARS-CoV-2/FLU/RSV plus assay is intended as an aid in the diagnosis of influenza from Nasopharyngeal swab specimens and should not be used as a sole basis for treatment. Nasal washings and aspirates are unacceptable for Xpert Xpress SARS-CoV-2/FLU/RSV testing.  Fact Sheet for Patients: BloggerCourse.com  Fact Sheet for Healthcare Providers: SeriousBroker.it  This test is not yet approved or cleared by the [place] FDA and has been authorized for detection and/or diagnosis of SARS-CoV-2 by FDA under an Emergency Use Authorization (EUA). This EUA will remain in effect (meaning this test can be used) for the duration of the COVID-19 declaration under Section 564(b)(1) of the Act, 21 U.S.C. section 360bbb-3(b)(1), unless the authorization is terminated or revoked.     Resp Syncytial Virus by PCR NEGATIVE NEGATIVE Final    Comment: (NOTE) Fact Sheet for Patients: BloggerCourse.com  Fact Sheet for Healthcare Providers: SeriousBroker.it  This test is not yet approved or cleared by the [place] FDA and has been authorized for detection and/or diagnosis of SARS-CoV-2 by FDA under an Emergency Use Authorization (EUA). This EUA will remain in effect  (meaning this test can be used) for the duration of the COVID-19 declaration under Section 564(b)(1) of the Act, 21 U.S.C. section 360bbb-3(b)(1), unless the authorization is terminated or revoked.  Performed at [facility], [address], [place], [place] [address]   Blood Culture (routine x 2)     Status: None (Preliminary result)  Collection Time: 03/27/23 10:58 AM   Specimen: BLOOD  Result Value Ref Range Status   Specimen Description BLOOD BLOOD RIGHT ARM RAC  Final   Special Requests   Final    Blood Culture adequate volume BOTTLES DRAWN AEROBIC AND ANAEROBIC   Culture   Final    NO GROWTH 2 DAYS Performed at [facility], [address] [place], [place] [address]    Report Status PENDING  Incomplete      Radiology Studies: No results found.  Scheduled Meds:  atorvastatin  20 mg Oral QHS   dextromethorphan-guaiFENesin  1 tablet Oral BID   escitalopram  10 mg Oral Daily   folic acid  1 mg Oral Daily   levalbuterol  0.63 mg Nebulization TID   levothyroxine  200 mcg Oral Q0600   methylPREDNISolone (SOLU-MEDROL) injection  40 mg Intravenous Q12H   metoprolol tartrate  12.5 mg Oral BID   pantoprazole  40 mg Oral Daily   rivaroxaban  20 mg Oral Q supper   sodium chloride flush  3 mL Intravenous Q12H   sodium chloride flush  3 mL Intravenous Q12H   topiramate  100 mg Oral BID   Continuous Infusions:  azithromycin 500 mg (03/29/23 1151)   cefTRIAXone (ROCEPHIN)  IV 2 g (03/29/23 1022)    LOS: 2 days   Shon Hale M.D on 03/29/2023 at 5:21 PM  Go to [email or web address] - for contact info  [facility] - Office  [phone]  If 7PM-7AM, please contact night-coverage [email or web address] 03/29/2023, 5:21 PM

## 2023-03-30 DIAGNOSIS — Z91148 Patient's other noncompliance with medication regimen for other reason: Secondary | ICD-10-CM

## 2023-03-30 DIAGNOSIS — I4819 Other persistent atrial fibrillation: Secondary | ICD-10-CM

## 2023-03-30 DIAGNOSIS — J441 Chronic obstructive pulmonary disease with (acute) exacerbation: Secondary | ICD-10-CM | POA: Diagnosis not present

## 2023-03-30 DIAGNOSIS — K219 Gastro-esophageal reflux disease without esophagitis: Secondary | ICD-10-CM

## 2023-03-30 DIAGNOSIS — J9621 Acute and chronic respiratory failure with hypoxia: Secondary | ICD-10-CM | POA: Diagnosis not present

## 2023-03-30 DIAGNOSIS — I5032 Chronic diastolic (congestive) heart failure: Secondary | ICD-10-CM

## 2023-03-30 DIAGNOSIS — J9622 Acute and chronic respiratory failure with hypercapnia: Secondary | ICD-10-CM

## 2023-03-30 MED ORDER — PREDNISONE 20 MG PO TABS: ORAL_TABLET | ORAL | 0 refills | Status: AC

## 2023-03-30 MED ORDER — DM-GUAIFENESIN ER 30-600 MG PO TB12: 1.0000 | ORAL_TABLET | Freq: Two times a day (BID) | ORAL | 0 refills | Status: AC | PRN

## 2023-03-30 MED ORDER — AMOXICILLIN-POT CLAVULANATE 875-125 MG PO TABS: 1.0000 | ORAL_TABLET | Freq: Two times a day (BID) | ORAL | 0 refills | Status: AC

## 2023-03-30 NOTE — Plan of Care (Signed)
   Problem: Education: Goal: Knowledge of General Education information will improve Description: Including pain rating scale, medication(s)/side effects and non-pharmacologic comfort measures Outcome: Progressing   Problem: Activity: Goal: Risk for activity intolerance will decrease Outcome: Progressing   Problem: Nutrition: Goal: Adequate nutrition will be maintained Outcome: Progressing   Problem: Coping: Goal: Level of anxiety will decrease Outcome: Progressing

## 2023-03-30 NOTE — Plan of Care (Signed)

## 2023-03-30 NOTE — Progress Notes (Signed)
   03/30/23 1736  TOC Discharge Assessment  Final next level of care Home/Self Care  Once discharged, how will the patient get to their discharge location? Family/Friend - Photographer  Barriers to Discharge No Barriers Identified  DME Agency [employer]

## 2023-03-30 NOTE — Discharge Summary (Signed)
 Physician Discharge Summary   Patient: Terri Casey MRN: [id number] DOB: 1960/10/03  Admit date:     03/27/2023  Discharge date: 03/30/23  Discharge Physician: Vassie Loll   PCP: Karl Bales, NP   Recommendations at discharge:  Repeat basic metabolic panel to follow [facility] renal function Arrange outpatient follow-up with pulmonologist for repeat PFTs and further adjustment to maintenance therapy for patient's COPD. Repeat chest x-ray in 8 weeks to assure resolution of infiltrates.  Discharge Diagnoses: Principal Problem:   COPD with acute exacerbation (HCC) Active Problems:   COPD exacerbation (HCC)   Acute on chronic respiratory failure with hypoxia and hypercapnia (HCC)   Sepsis due to Lt Sided Pneumonia (HCC)   Chronic diastolic CHF (congestive heart failure) (HCC)   Noncompliance with medication regimen   Persistent atrial fibrillation (HCC)   GERD (gastroesophageal reflux disease)  Brief Hospital admission narrative: As per H&P written by Dr. Mariea Clonts on 03/27/2027 Terri Casey is a 63 y.o. female with medical history significant for COPD with chronic hypoxic and hypercapnic respiratory failure on supplemental oxygen at 3 LPM via [place] at home and uses CPAP nightly,, chronic diastolic CHF, hypothyroidism, PAF on Xarelto, depression/Anxiety and GERD presents to the ED with worsening cough, dyspnea and hypoxia since 03/23/2023. -No Chest pains, No palpitations no dizziness -Patient reports cough, congestion -Patient had fevers at home, no fever she -Patient was very tachypneic tachycardic with increased work of breathing in the ED -- Patient initially required continuous BiPAP and recurrent doses of bronchodilators in the ED -Currently weaned back down to oxygen at 10 L per nasal cannula - -in ED--chest x-ray shows Mildly increased left midlung opacity is noted concerning for possible pneumonia. -COVID, flu and RSV negative -VBG with a pH of 7.3, pCO2 75  similar to prior, pO2 53 bicarb 36.9 -WBC 11.6 -Lactic acid 1.4 repeat 1.4 -Hemoglobin is 11.9 higher than prior baseline, MCV 103, platelets 126 similar to prior baseline -Creatinine 0.62 potassium 3.8 LFTs not elevated -EKG Per EDP report without acute findings  Assessment and Plan: 1)Sepsis secondary to left-sided Pneumonia--POA -Chest x-ray finding of left-sided pneumonia noted -COVID, flu and RSV negative -At discharge afebrile, speaking in full sentences and feeling ready to go home -Oral antibiotics has been prescribed and also instructions to resume home bronchodilator management and a steroid tapering. -Outpatient follow-up with PCP and pulmonologist recommended.   2)Acute on chronic hypoxic and hypercapnic respiratory failure secondary to COPD exacerbation in the setting of left-sided pneumonia- VBG with a pH of 7.3, pCO2 75 similar to prior, pO2 53 bicarb 36.9 -at baseline prior to admission patient already uses 2-3 L of oxygen via nasal cannula and at time of admission require 4-5 per records review. -At discharge able to go back to chronic oxygen supplementation, able to speak in full sentences and feeling improved. -Patient has been discharge on the steroids tapering, mucolytic, oral antibiotics and resumption of home bronchodilator management.   3)Paroxysmal A.Fib-- -stable at this time, actually appears to be in sinus rhythm  -continue metoprolol for rate control -Continue Xarelto for stroke prophylaxis   4)Chronic diastolic dysfunction CHF--echo from 09/17/2021 with EF of 60 to 65% with grade 1 diastolic dysfunction, mild LVH -- Continue metoprolol and heart healthy/low-sodium diet -Condition is stable and compensated.   5)Hypothyroidism--- continue Levothyroxine -Continue to follow thyroid panel intermittently as an outpatient.   6)GERD- -continue PPI.   7)Depression/Anxiety--- stable, continue Lexapro -No suicidal ideation or hallucination.   8)Tobacco  Abuse---- -patient apparently quit  smoking few weeks ago -Congratulated and advised to avoid secondhand smoking.   Consultants: None Procedures performed: See below for x-ray reports. Disposition: Home Diet recommendation: Heart healthy/low-sodium diet.  DISCHARGE MEDICATION: Allergies as of 03/30/2023       Reactions   Estrogens Hives   Mustard Hives   Strawberry Extract Hives        Medication List     TAKE these medications    acetaminophen 500 MG tablet Commonly known as: TYLENOL Take 500 mg by mouth every 6 (six) hours as needed for mild pain (pain score 1-3).   albuterol (2.5 MG/3ML) 0.083% nebulizer solution Commonly known as: PROVENTIL Take 3 mLs (2.5 mg total) by nebulization every 6 (six) hours as needed for wheezing or shortness of breath.   albuterol 108 (90 Base) MCG/ACT inhaler Commonly known as: VENTOLIN HFA Inhale 3 puffs into the lungs every 6 (six) hours as needed for wheezing or shortness of breath.   amoxicillin-clavulanate 875-125 MG tablet Commonly known as: AUGMENTIN Take 1 tablet by mouth 2 (two) times daily for 4 days.   atorvastatin 20 MG tablet Commonly known as: LIPITOR Take 1 tablet (20 mg total) by mouth at bedtime.   dextromethorphan-guaiFENesin 30-600 MG 12hr tablet Commonly known as: MUCINEX DM Take 1 tablet by mouth 2 (two) times daily as needed for cough.   escitalopram 10 MG tablet Commonly known as: LEXAPRO Take 1 tablet (10 mg total) by mouth daily.   fluticasone 110 MCG/ACT inhaler Commonly known as: FLOVENT HFA Inhale 1 puff into the lungs daily.   folic acid 1 MG tablet Commonly known as: FOLVITE Take 1 tablet (1 mg total) by mouth daily.   levothyroxine 200 MCG tablet Commonly known as: SYNTHROID Take 1 tablet (200 mcg total) by mouth daily at 6 (six) AM.   loratadine 10 MG tablet Commonly known as: CLARITIN Take 10 mg by mouth at bedtime.   metoprolol tartrate 25 MG tablet Commonly known as:  LOPRESSOR Take 12.5 mg by mouth 2 (two) times daily.   pantoprazole 40 MG tablet Commonly known as: PROTONIX Take 1 tablet (40 mg total) by mouth daily.   predniSONE 20 MG tablet Commonly known as: DELTASONE Take 3 tablets by mouth daily x 1 day; then 2 tablets by mouth daily x 2 days; then 1 tablet by mouth daily x 3 days; then half tablet by mouth daily x 3 days and stop prednisone. What changed: additional instructions   SUMAtriptan 50 MG tablet Commonly known as: IMITREX Take 50 mg by mouth every 2 (two) hours as needed for migraine.   topiramate 100 MG tablet Commonly known as: TOPAMAX Take 100 mg by mouth 2 (two) times daily.   Xarelto 20 MG Tabs tablet Generic drug: rivaroxaban Take 20 mg by mouth daily.        Follow-up Information     Karl Bales, NP. Schedule an appointment as soon as possible for a visit in 10 day(s).   Specialty: Family Medicine Contact information: [address] [place] [address] [phone]                Discharge Exam: Ceasar Mons Weights   03/27/23 1109 03/27/23 1307  Weight: 80.7 kg 78.8 kg   General exam: Alert, awake, oriented x 3; speaking in full sentences and demonstrating good saturation on chronic supplementation. Respiratory system: Improved air movement bilaterally; no using accessory muscles.  Positive rhonchi and mild expiratory wheezing appreciated Cardiovascular system:RRR. No rubs or gallops; no  JVD. Gastrointestinal system: Abdomen is nondistended, soft and nontender. No organomegaly or masses felt. Normal bowel sounds heard. Central nervous system: No focal neurological deficits. Extremities: No cyanosis or clubbing. Skin: No petechiae. Psychiatry: Judgement and insight appear normal. Mood & affect appropriate.    Condition at discharge: stable  The results of significant diagnostics from this hospitalization (including imaging, microbiology, ancillary and laboratory) are listed below for  reference.   Imaging Studies: DG Chest Port 1 View Result Date: 03/27/2023 CLINICAL DATA:  Shortness of breath, fever. EXAM: PORTABLE CHEST 1 VIEW COMPARISON:  January 28, 2023. FINDINGS: Stable cardiomediastinal silhouette. Right lung is unremarkable. Mildly increased left midlung opacity is noted concerning for focal inflammation or pneumonia. Bony thorax is unremarkable. IMPRESSION: Mildly increased left midlung opacity is noted concerning for possible pneumonia. Followup PA and lateral chest X-ray is recommended in 3-4 weeks following trial of antibiotic therapy to ensure resolution and exclude underlying malignancy. Electronically Signed   By: Lupita Raider M.D.   On: 03/27/2023 11:19    Microbiology: Results for orders placed or performed during the hospital encounter of 03/27/23  Blood Culture (routine x 2)     Status: None (Preliminary result)   Collection Time: 03/27/23 10:25 AM   Specimen: BLOOD  Result Value Ref Range Status   Specimen Description BLOOD RIGHT HAND  Final   Special Requests   Final    BOTTLES DRAWN AEROBIC AND ANAEROBIC Blood Culture adequate volume   Culture   Final    NO GROWTH 3 DAYS Performed at [facility], [address], [place], [place] [address]    Report Status PENDING  Incomplete  Resp panel by RT-PCR (RSV, Flu A&B, Covid) Anterior Nasal Swab     Status: None   Collection Time: 03/27/23 10:37 AM   Specimen: Anterior Nasal Swab  Result Value Ref Range Status   SARS Coronavirus 2 by RT PCR NEGATIVE NEGATIVE Final    Comment: (NOTE) SARS-CoV-2 target nucleic acids are NOT DETECTED.  The SARS-CoV-2 RNA is generally detectable in upper respiratory specimens during the acute phase of infection. The lowest concentration of SARS-CoV-2 viral copies this assay can detect is 138 copies/mL. A negative result does not preclude SARS-Cov-2 infection and should not be used as the sole basis for treatment or other patient management decisions. A negative  result may occur with  improper specimen collection/handling, submission of specimen other than nasopharyngeal swab, presence of viral mutation(s) within the areas targeted by this assay, and inadequate number of viral copies(<138 copies/mL). A negative result must be combined with clinical observations, patient history, and epidemiological information. The expected result is Negative.  Fact Sheet for Patients:  BloggerCourse.com  Fact Sheet for Healthcare Providers:  SeriousBroker.it  This test is no t yet approved or cleared by the [place] FDA and  has been authorized for detection and/or diagnosis of SARS-CoV-2 by FDA under an Emergency Use Authorization (EUA). This EUA will remain  in effect (meaning this test can be used) for the duration of the COVID-19 declaration under Section 564(b)(1) of the Act, 21 U.S.C.section 360bbb-3(b)(1), unless the authorization is terminated  or revoked sooner.       Influenza A by PCR NEGATIVE NEGATIVE Final   Influenza B by PCR NEGATIVE NEGATIVE Final    Comment: (NOTE) The Xpert Xpress SARS-CoV-2/FLU/RSV plus assay is intended as an aid in the diagnosis of influenza from Nasopharyngeal swab specimens and should not be used as a sole basis for treatment. Nasal washings and  aspirates are unacceptable for Xpert Xpress SARS-CoV-2/FLU/RSV testing.  Fact Sheet for Patients: BloggerCourse.com  Fact Sheet for Healthcare Providers: SeriousBroker.it  This test is not yet approved or cleared by the [place] FDA and has been authorized for detection and/or diagnosis of SARS-CoV-2 by FDA under an Emergency Use Authorization (EUA). This EUA will remain in effect (meaning this test can be used) for the duration of the COVID-19 declaration under Section 564(b)(1) of the Act, 21 U.S.C. section 360bbb-3(b)(1), unless the authorization is  terminated or revoked.     Resp Syncytial Virus by PCR NEGATIVE NEGATIVE Final    Comment: (NOTE) Fact Sheet for Patients: BloggerCourse.com  Fact Sheet for Healthcare Providers: SeriousBroker.it  This test is not yet approved or cleared by the [place] FDA and has been authorized for detection and/or diagnosis of SARS-CoV-2 by FDA under an Emergency Use Authorization (EUA). This EUA will remain in effect (meaning this test can be used) for the duration of the COVID-19 declaration under Section 564(b)(1) of the Act, 21 U.S.C. section 360bbb-3(b)(1), unless the authorization is terminated or revoked.  Performed at [facility], [address], [place], [place] [address]   Blood Culture (routine x 2)     Status: None (Preliminary result)   Collection Time: 03/27/23 10:58 AM   Specimen: BLOOD  Result Value Ref Range Status   Specimen Description BLOOD BLOOD RIGHT ARM RAC  Final   Special Requests   Final    Blood Culture adequate volume BOTTLES DRAWN AEROBIC AND ANAEROBIC   Culture   Final    NO GROWTH 3 DAYS Performed at [facility], [address], [place], [place] [address]    Report Status PENDING  Incomplete    Labs: CBC: Recent Labs  Lab 03/27/23 1025  WBC 11.6*  NEUTROABS 9.5*  HGB 11.9*  HCT 40.5  MCV 103.8*  PLT 126*   Basic Metabolic Panel: Recent Labs  Lab 03/27/23 1025 03/29/23 0409  NA 141 141  K 3.8 4.2  CL 98 105  CO2 31 32  GLUCOSE 150* 126*  BUN 16 18  CREATININE 0.62 0.55  CALCIUM 9.6 9.3   Liver Function Tests: Recent Labs  Lab 03/27/23 1025  AST 15  ALT 9  ALKPHOS 90  BILITOT 0.8  PROT 7.2  ALBUMIN 3.6   CBG: No results for input(s): "GLUCAP" in the last 168 hours.  Discharge time spent: greater than 30 minutes.  Signed: Vassie Loll, MD [facility] 03/30/2023

## 2023-04-01 ENCOUNTER — Ambulatory Visit (HOSPITAL_COMMUNITY)

## 2023-04-01 LAB — CULTURE, BLOOD (ROUTINE X 2)
Culture: NO GROWTH
Culture: NO GROWTH
Special Requests: ADEQUATE
Special Requests: ADEQUATE

## 2023-04-12 ENCOUNTER — Ambulatory Visit (HOSPITAL_COMMUNITY)

## 2023-05-07 ENCOUNTER — Ambulatory Visit (HOSPITAL_COMMUNITY)

## 2023-06-07 ENCOUNTER — Ambulatory Visit (HOSPITAL_COMMUNITY)

## 2023-08-12 ENCOUNTER — Encounter: Payer: Self-pay | Admitting: Internal Medicine
